# Patient Record
Sex: Female | Born: 1940
Health system: Southern US, Community
[De-identification: ages and names within clinical notes are randomized; demographics above are authoritative.]

## PROBLEM LIST (undated history)

## (undated) DIAGNOSIS — R112 Nausea with vomiting, unspecified: Secondary | ICD-10-CM

## (undated) DIAGNOSIS — E785 Hyperlipidemia, unspecified: Secondary | ICD-10-CM

## (undated) DIAGNOSIS — E039 Hypothyroidism, unspecified: Secondary | ICD-10-CM

## (undated) DIAGNOSIS — IMO0001 Reserved for inherently not codable concepts without codable children: Secondary | ICD-10-CM

## (undated) DIAGNOSIS — J40 Bronchitis, not specified as acute or chronic: Secondary | ICD-10-CM

## (undated) DIAGNOSIS — R059 Cough, unspecified: Secondary | ICD-10-CM

## (undated) DIAGNOSIS — Z9889 Other specified postprocedural states: Secondary | ICD-10-CM

## (undated) DIAGNOSIS — I10 Essential (primary) hypertension: Secondary | ICD-10-CM

## (undated) DIAGNOSIS — K219 Gastro-esophageal reflux disease without esophagitis: Secondary | ICD-10-CM

## (undated) DIAGNOSIS — IMO0002 Reserved for concepts with insufficient information to code with codable children: Secondary | ICD-10-CM

## (undated) DIAGNOSIS — I340 Nonrheumatic mitral (valve) insufficiency: Secondary | ICD-10-CM

## (undated) DIAGNOSIS — M549 Dorsalgia, unspecified: Secondary | ICD-10-CM

## (undated) DIAGNOSIS — Z9289 Personal history of other medical treatment: Secondary | ICD-10-CM

## (undated) DIAGNOSIS — E079 Disorder of thyroid, unspecified: Secondary | ICD-10-CM

## (undated) DIAGNOSIS — Z8709 Personal history of other diseases of the respiratory system: Secondary | ICD-10-CM

## (undated) DIAGNOSIS — C3490 Malignant neoplasm of unspecified part of unspecified bronchus or lung: Secondary | ICD-10-CM

## (undated) DIAGNOSIS — Z8489 Family history of other specified conditions: Secondary | ICD-10-CM

## (undated) DIAGNOSIS — M1990 Unspecified osteoarthritis, unspecified site: Secondary | ICD-10-CM

## (undated) DIAGNOSIS — R0602 Shortness of breath: Secondary | ICD-10-CM

## (undated) DIAGNOSIS — R55 Syncope and collapse: Secondary | ICD-10-CM

## (undated) DIAGNOSIS — R011 Cardiac murmur, unspecified: Secondary | ICD-10-CM

## (undated) DIAGNOSIS — I4891 Unspecified atrial fibrillation: Secondary | ICD-10-CM

## (undated) DIAGNOSIS — Z9221 Personal history of antineoplastic chemotherapy: Secondary | ICD-10-CM

## (undated) DIAGNOSIS — I351 Nonrheumatic aortic (valve) insufficiency: Secondary | ICD-10-CM

## (undated) DIAGNOSIS — J189 Pneumonia, unspecified organism: Secondary | ICD-10-CM

## (undated) DIAGNOSIS — N2889 Other specified disorders of kidney and ureter: Secondary | ICD-10-CM

## (undated) DIAGNOSIS — R05 Cough: Secondary | ICD-10-CM

## (undated) DIAGNOSIS — J45909 Unspecified asthma, uncomplicated: Secondary | ICD-10-CM

## (undated) HISTORY — PX: DILATION AND CURETTAGE OF UTERUS: SHX78

## (undated) HISTORY — DX: Nonrheumatic aortic (valve) insufficiency: I35.1

## (undated) HISTORY — PX: EYE SURGERY: SHX253

## (undated) HISTORY — DX: Hyperlipidemia, unspecified: E78.5

## (undated) HISTORY — DX: Personal history of other medical treatment: Z92.89

## (undated) HISTORY — DX: Other specified disorders of kidney and ureter: N28.89

## (undated) HISTORY — DX: Dorsalgia, unspecified: M54.9

## (undated) HISTORY — DX: Nonrheumatic mitral (valve) insufficiency: I34.0

---

## 1971-09-05 HISTORY — PX: THYROIDECTOMY: SHX17

## 1986-09-04 HISTORY — PX: CARPAL TUNNEL RELEASE: SHX101

## 1998-05-27 ENCOUNTER — Ambulatory Visit (HOSPITAL_COMMUNITY): Admission: RE | Admit: 1998-05-27 | Discharge: 1998-05-27 | Payer: Self-pay | Admitting: Gynecology

## 1999-04-20 ENCOUNTER — Encounter: Admission: RE | Admit: 1999-04-20 | Discharge: 1999-07-19 | Payer: Self-pay | Admitting: Internal Medicine

## 1999-06-27 ENCOUNTER — Encounter (INDEPENDENT_AMBULATORY_CARE_PROVIDER_SITE_OTHER): Payer: Self-pay

## 1999-06-27 ENCOUNTER — Other Ambulatory Visit: Admission: RE | Admit: 1999-06-27 | Discharge: 1999-06-27 | Payer: Self-pay | Admitting: Otolaryngology

## 1999-11-25 ENCOUNTER — Other Ambulatory Visit: Admission: RE | Admit: 1999-11-25 | Discharge: 1999-11-25 | Payer: Self-pay | Admitting: Gynecology

## 2001-01-10 ENCOUNTER — Other Ambulatory Visit: Admission: RE | Admit: 2001-01-10 | Discharge: 2001-01-10 | Payer: Self-pay | Admitting: Gynecology

## 2001-09-04 HISTORY — PX: JOINT REPLACEMENT: SHX530

## 2002-02-03 ENCOUNTER — Other Ambulatory Visit: Admission: RE | Admit: 2002-02-03 | Discharge: 2002-02-03 | Payer: Self-pay | Admitting: Gynecology

## 2002-02-14 ENCOUNTER — Ambulatory Visit (HOSPITAL_COMMUNITY): Admission: RE | Admit: 2002-02-14 | Discharge: 2002-02-14 | Payer: Self-pay | Admitting: *Deleted

## 2002-02-14 ENCOUNTER — Encounter (INDEPENDENT_AMBULATORY_CARE_PROVIDER_SITE_OTHER): Payer: Self-pay | Admitting: Specialist

## 2003-08-06 ENCOUNTER — Other Ambulatory Visit: Admission: RE | Admit: 2003-08-06 | Discharge: 2003-08-06 | Payer: Self-pay | Admitting: Gynecology

## 2004-09-04 HISTORY — PX: ROTATOR CUFF REPAIR: SHX139

## 2006-02-26 DIAGNOSIS — Z9289 Personal history of other medical treatment: Secondary | ICD-10-CM

## 2006-02-26 HISTORY — DX: Personal history of other medical treatment: Z92.89

## 2008-04-24 ENCOUNTER — Other Ambulatory Visit: Admission: RE | Admit: 2008-04-24 | Discharge: 2008-04-24 | Payer: Self-pay | Admitting: Internal Medicine

## 2008-04-30 ENCOUNTER — Encounter: Admission: RE | Admit: 2008-04-30 | Discharge: 2008-04-30 | Payer: Self-pay | Admitting: Internal Medicine

## 2008-06-25 ENCOUNTER — Encounter: Payer: Self-pay | Admitting: Internal Medicine

## 2008-07-02 ENCOUNTER — Encounter: Payer: Self-pay | Admitting: Internal Medicine

## 2008-07-08 HISTORY — PX: CARDIAC CATHETERIZATION: SHX172

## 2008-07-23 ENCOUNTER — Encounter: Payer: Self-pay | Admitting: Internal Medicine

## 2008-08-10 ENCOUNTER — Encounter: Admission: RE | Admit: 2008-08-10 | Discharge: 2008-08-10 | Payer: Self-pay | Admitting: Allergy and Immunology

## 2008-08-17 ENCOUNTER — Ambulatory Visit: Payer: Self-pay | Admitting: Internal Medicine

## 2008-08-17 DIAGNOSIS — J45909 Unspecified asthma, uncomplicated: Secondary | ICD-10-CM

## 2008-08-17 DIAGNOSIS — J309 Allergic rhinitis, unspecified: Secondary | ICD-10-CM | POA: Insufficient documentation

## 2008-08-17 DIAGNOSIS — I10 Essential (primary) hypertension: Secondary | ICD-10-CM

## 2008-08-17 DIAGNOSIS — E785 Hyperlipidemia, unspecified: Secondary | ICD-10-CM

## 2008-08-17 DIAGNOSIS — R0602 Shortness of breath: Secondary | ICD-10-CM

## 2008-08-26 ENCOUNTER — Ambulatory Visit: Payer: Self-pay | Admitting: Internal Medicine

## 2008-09-08 ENCOUNTER — Ambulatory Visit: Payer: Self-pay | Admitting: Internal Medicine

## 2008-09-08 LAB — CONVERTED CEMR LAB: IgE (Immunoglobulin E), Serum: 27.6 intl units/mL (ref 0.0–180.0)

## 2008-09-10 ENCOUNTER — Ambulatory Visit: Payer: Self-pay | Admitting: Internal Medicine

## 2008-10-02 ENCOUNTER — Encounter (INDEPENDENT_AMBULATORY_CARE_PROVIDER_SITE_OTHER): Payer: Self-pay | Admitting: *Deleted

## 2008-10-02 ENCOUNTER — Encounter: Payer: Self-pay | Admitting: Internal Medicine

## 2008-10-26 ENCOUNTER — Encounter: Payer: Self-pay | Admitting: Internal Medicine

## 2008-10-26 ENCOUNTER — Ambulatory Visit (HOSPITAL_COMMUNITY): Admission: RE | Admit: 2008-10-26 | Discharge: 2008-10-26 | Payer: Self-pay | Admitting: Internal Medicine

## 2008-10-30 ENCOUNTER — Encounter (HOSPITAL_COMMUNITY): Admission: RE | Admit: 2008-10-30 | Discharge: 2009-01-28 | Payer: Self-pay | Admitting: Internal Medicine

## 2008-10-30 ENCOUNTER — Encounter: Payer: Self-pay | Admitting: Internal Medicine

## 2008-11-18 ENCOUNTER — Ambulatory Visit: Payer: Self-pay | Admitting: Internal Medicine

## 2008-12-15 ENCOUNTER — Ambulatory Visit: Payer: Self-pay | Admitting: Internal Medicine

## 2008-12-28 ENCOUNTER — Encounter: Payer: Self-pay | Admitting: Internal Medicine

## 2009-01-19 ENCOUNTER — Ambulatory Visit (HOSPITAL_COMMUNITY): Admission: RE | Admit: 2009-01-19 | Discharge: 2009-01-19 | Payer: Self-pay | Admitting: Internal Medicine

## 2009-01-19 ENCOUNTER — Encounter: Payer: Self-pay | Admitting: Internal Medicine

## 2009-01-26 ENCOUNTER — Ambulatory Visit: Payer: Self-pay | Admitting: Internal Medicine

## 2009-01-29 ENCOUNTER — Encounter (HOSPITAL_COMMUNITY): Admission: RE | Admit: 2009-01-29 | Discharge: 2009-04-04 | Payer: Self-pay | Admitting: Internal Medicine

## 2009-02-03 ENCOUNTER — Telehealth: Payer: Self-pay | Admitting: Internal Medicine

## 2009-02-05 ENCOUNTER — Encounter (INDEPENDENT_AMBULATORY_CARE_PROVIDER_SITE_OTHER): Payer: Self-pay | Admitting: *Deleted

## 2009-02-05 ENCOUNTER — Ambulatory Visit (HOSPITAL_COMMUNITY): Admission: RE | Admit: 2009-02-05 | Discharge: 2009-02-05 | Payer: Self-pay | Admitting: *Deleted

## 2009-02-08 ENCOUNTER — Ambulatory Visit: Payer: Self-pay | Admitting: Cardiovascular Disease

## 2009-02-09 ENCOUNTER — Telehealth (INDEPENDENT_AMBULATORY_CARE_PROVIDER_SITE_OTHER): Payer: Self-pay | Admitting: *Deleted

## 2009-02-16 ENCOUNTER — Encounter: Payer: Self-pay | Admitting: Internal Medicine

## 2009-02-23 ENCOUNTER — Encounter: Admission: RE | Admit: 2009-02-23 | Discharge: 2009-02-23 | Payer: Self-pay | Admitting: Internal Medicine

## 2009-02-23 ENCOUNTER — Other Ambulatory Visit: Admission: RE | Admit: 2009-02-23 | Discharge: 2009-02-23 | Payer: Self-pay | Admitting: Interventional Radiology

## 2009-02-23 ENCOUNTER — Encounter (INDEPENDENT_AMBULATORY_CARE_PROVIDER_SITE_OTHER): Payer: Self-pay | Admitting: Interventional Radiology

## 2009-07-22 ENCOUNTER — Ambulatory Visit (HOSPITAL_COMMUNITY): Admission: RE | Admit: 2009-07-22 | Discharge: 2009-07-23 | Payer: Self-pay | Admitting: Surgery

## 2009-07-22 ENCOUNTER — Encounter (INDEPENDENT_AMBULATORY_CARE_PROVIDER_SITE_OTHER): Payer: Self-pay | Admitting: Surgery

## 2010-01-05 ENCOUNTER — Ambulatory Visit (HOSPITAL_COMMUNITY): Admission: RE | Admit: 2010-01-05 | Discharge: 2010-01-05 | Payer: Self-pay | Admitting: Gastroenterology

## 2010-09-04 HISTORY — PX: KNEE ARTHROSCOPY: SUR90

## 2010-12-07 LAB — BASIC METABOLIC PANEL
CO2: 30 mEq/L (ref 19–32)
Calcium: 9.6 mg/dL (ref 8.4–10.5)
Creatinine, Ser: 0.92 mg/dL (ref 0.4–1.2)
GFR calc Af Amer: >60 mL/min (ref >60)

## 2010-12-07 LAB — CBC
MCHC: 33.9 g/dL (ref 30.0–36.0)
RBC: 3.54 MIL/uL — ABNORMAL LOW (ref 3.87–5.11)

## 2010-12-07 LAB — DIFFERENTIAL
Basophils Absolute: 0 10*3/uL (ref 0.0–0.1)
Basophils Relative: 0 % (ref 0–1)
Monocytes Relative: 7 % (ref 3–12)
Neutro Abs: 6.5 10*3/uL (ref 1.7–7.7)
Neutrophils Relative %: 69 % (ref 43–77)

## 2010-12-07 LAB — URINALYSIS, ROUTINE W REFLEX MICROSCOPIC
Hgb urine dipstick: NEGATIVE
Protein, ur: NEGATIVE mg/dL
Urobilinogen, UA: 0.2 mg/dL (ref 0.0–1.0)

## 2010-12-07 LAB — PROTIME-INR
INR: 0.85 (ref 0.00–1.49)
Prothrombin Time: 11.5 seconds — ABNORMAL LOW (ref 11.6–15.2)

## 2010-12-07 LAB — CALCIUM: Calcium: 9 mg/dL (ref 8.4–10.5)

## 2011-01-17 NOTE — Op Note (Signed)
NAME:  Kimberly [name], VIRGO NO.:  [id number]   MEDICAL RECORD NO.:  [id number]          PATIENT TYPE:  AMB   LOCATION:  ENDO                         FACILITY:  [facility]   PHYSICIAN:  Georgiana Spinner, M.D.    DATE OF BIRTH:  [birth year]   DATE OF PROCEDURE:  02/05/2009  DATE OF DISCHARGE:                               OPERATIVE REPORT   PROCEDURE:  Colonoscopy.   INDICATIONS:  Hemoccult positivity.   ANESTHESIA:  Fentanyl 25 mcg, Versed 2.5 mg.   PROCEDURE IN DETAIL:  With the patient mildly sedated in the left  lateral decubitus position the Pentax videoscopic pediatric colonoscope  was inserted in the rectum and passed under direct vision with pressure  applied and the patient rolled to her back.  We were able to reach the  cecum as identified by ileocecal valve and appendiceal orifice, both of  which were photographed.  From this point the colonoscope was slowly  withdrawn taking circumferential views of colonic mucosa stopping only  in the rectum which appeared normal on direct and showed hemorrhoids on  retroflexed view and pulled through the anal canal.  The endoscope was  straightened and withdrawn.  The patient's vital signs, pulse oximeter  remained stable.  The patient tolerated procedure well without apparent  complications.   FINDINGS:  Occasional diverticulum and hemorrhoids noted on retroflexed  view and pulled through the anal canal, otherwise an unremarkable exam.   PLAN:  Have the patient follow up with me as noted on the endoscopy  note.           ______________________________  Georgiana Spinner, M.D.     GMO/MEDQ  D:  02/05/2009  T:  02/05/2009  Job:  [id number]

## 2011-01-17 NOTE — Op Note (Signed)
NAME:  Kimberly [name], PHENIX NO.:  [id number]   MEDICAL RECORD NO.:  [id number]          PATIENT TYPE:  AMB   LOCATION:  ENDO                         FACILITY:  [facility]   PHYSICIAN:  Georgiana Spinner, M.D.    DATE OF BIRTH:  1941-02-08   DATE OF PROCEDURE:  02/05/2009  DATE OF DISCHARGE:                               OPERATIVE REPORT   PROCEDURE:  Upper endoscopy.   INDICATIONS:  Hemoccult positivity.   ANESTHESIA:  Fentanyl 75 mcg, Versed 7.5 mg.   PROCEDURE:  With the patient mildly sedated in the left lateral  decubitus position the Pentax videoscopic endoscope was inserted in the  mouth, passed under direct vision through the esophagus into the  stomach.  Fundus, body, antrum, duodenal bulb, second portion of  duodenum were visualized.  From this point the endoscope was slowly  withdrawn, taking circumferential views of duodenal mucosa until the  endoscope had been pulled back into the stomach, placed in retroflexion  to view the stomach from below.  The endoscope was straightened and  withdrawn, taking circumferential views of remaining gastric and  esophageal mucosa, stopping in the distal esophagus, where an [place] of  Barrett's esophagus was photographed and biopsied.  The endoscope was  withdrawn.  The patient's vital signs, pulse oximeter remained stable.  The patient tolerated procedure well without apparent complications.   FINDINGS:  One island of Barrett's esophagus, biopsied.  Await biopsy  report.  The patient will call me for results and follow up with me as  an outpatient.  Proceed to colonoscopy as planned.           ______________________________  Georgiana Spinner, M.D.     GMO/MEDQ  D:  02/05/2009  T:  02/05/2009  Job:  [id number]

## 2011-01-20 NOTE — Procedures (Signed)
[facility]  Patient:    Kimberly [name], Kimberly [name] Visit Number: [id number] MRN: [id number]          Service Type: END Location: ENDO Attending Physician:  Sabino Gasser Dictated by:   Sabino Gasser, M.D. Proc. Date: 02/14/02 Admit Date:  02/14/2002                             Procedure Report  PROCEDURE:  Colonoscopy.  INDICATIONS:  Colon cancer screening.  ANESTHESIA:  Versed 1.5 mg.  DESCRIPTION OF PROCEDURE:  With the patient mildly sedated in the left lateral decubitus position, the Olympus videoscopic colonoscope was inserted into the rectum and passed under direct vision to the cecum, identified by the ileocecal valve and appendiceal orifice, both of which were photographed. From this point, the colonoscope was slowly withdrawn, taking circumferential views of the entire colonic mucosa, stopping only in the rectum which appeared normal on direct, and retroflexed view showed hemorrhoid.  We straightened the endoscope and withdrew, taking circumferential views of the anal canal, and hemorrhoid were once again seen externally.  These were photographed. The endoscope was withdrawn.  The patients vital signs and pulse oximeter remained stable.  The patient tolerated the procedure well without apparent complications.  FINDINGS:  Hemorrhoidal changes.  Otherwise unremarkable examination.  PLAN:  See endoscopy note for further details. Dictated by:   Sabino Gasser, M.D. Attending Physician:  Sabino Gasser DD:  02/14/02 TD:  02/15/02 Job: [id number]

## 2011-01-20 NOTE — Procedures (Signed)
[facility]  Patient:    Kimberly [name], Kimberly [name] Visit Number: [id number] MRN: [id number]          Service Type: END Location: ENDO Attending Physician:  Sabino Gasser Dictated by:   Sabino Gasser, M.D. Proc. Date: 02/14/02 Admit Date:  02/14/2002                             Procedure Report  PROCEDURE:  Upper endoscopy.  INDICATIONS:  Gastroesophageal reflux disease.  ANESTHESIA:  Demerol 70, Versed 6 mg.  DESCRIPTION OF PROCEDURE:  With the patient mildly sedated in the left lateral decubitus position, the Olympus videoscopic endoscope was inserted into the mouth and passed under direct vision through the esophagus, which appeared normal, until we reached the distal esophagus and showed changes of Barretts esophagus, photographed, and biopsied.  We entered into the stomach; fundus, body, antrum, duodenal bulb, and second portion of duodenum all appeared normal.  From this point, the endoscope was slowly withdrawn, taking circumferential views of the entire duodenal mucosa until the endoscope then pulled back into the stomach and placed in retroflexion to view the stomach from below.  The endoscope was then straightened and withdrawn, taking circumferential views of the remaining gastric and esophageal mucosa.  The patients vital signs and pulse oximeter remained stable.  The patient tolerated the procedure well without apparent complications.  FINDINGS:  Changes of Barretts esophagus, biopsied.  Await biopsy report. The patient will call me for results and follow up with me as an outpatient. Proceed to colonoscopy as planned. Dictated by:   Sabino Gasser, M.D. Attending Physician:  Sabino Gasser DD:  02/14/02 TD:  02/15/02 Job: [id number]

## 2011-04-04 ENCOUNTER — Other Ambulatory Visit (HOSPITAL_COMMUNITY)
Admission: RE | Admit: 2011-04-04 | Discharge: 2011-04-04 | Disposition: A | Discharge status: Home / Self Care | Payer: Medicare Other | Source: Ambulatory Visit | Attending: Internal Medicine | Admitting: Internal Medicine

## 2011-04-04 ENCOUNTER — Other Ambulatory Visit: Payer: Self-pay | Admitting: Internal Medicine

## 2011-04-04 DIAGNOSIS — Z1159 Encounter for screening for other viral diseases: Secondary | ICD-10-CM | POA: Insufficient documentation

## 2011-04-04 DIAGNOSIS — Z01419 Encounter for gynecological examination (general) (routine) without abnormal findings: Secondary | ICD-10-CM | POA: Insufficient documentation

## 2011-05-31 ENCOUNTER — Emergency Department (HOSPITAL_BASED_OUTPATIENT_CLINIC_OR_DEPARTMENT_OTHER)
Admission: EM | Admit: 2011-05-31 | Discharge: 2011-05-31 | Disposition: A | Discharge status: Home / Self Care | Payer: Medicare Other | Attending: Emergency Medicine | Admitting: Emergency Medicine

## 2011-05-31 ENCOUNTER — Other Ambulatory Visit: Payer: Self-pay

## 2011-05-31 ENCOUNTER — Emergency Department (INDEPENDENT_AMBULATORY_CARE_PROVIDER_SITE_OTHER): Payer: Medicare Other

## 2011-05-31 ENCOUNTER — Encounter: Payer: Self-pay | Admitting: *Deleted

## 2011-05-31 DIAGNOSIS — M549 Dorsalgia, unspecified: Secondary | ICD-10-CM

## 2011-05-31 DIAGNOSIS — K219 Gastro-esophageal reflux disease without esophagitis: Secondary | ICD-10-CM | POA: Insufficient documentation

## 2011-05-31 DIAGNOSIS — I10 Essential (primary) hypertension: Secondary | ICD-10-CM | POA: Insufficient documentation

## 2011-05-31 DIAGNOSIS — R0602 Shortness of breath: Secondary | ICD-10-CM

## 2011-05-31 DIAGNOSIS — R110 Nausea: Secondary | ICD-10-CM

## 2011-05-31 DIAGNOSIS — R079 Chest pain, unspecified: Secondary | ICD-10-CM

## 2011-05-31 DIAGNOSIS — R791 Abnormal coagulation profile: Secondary | ICD-10-CM

## 2011-05-31 DIAGNOSIS — E079 Disorder of thyroid, unspecified: Secondary | ICD-10-CM | POA: Insufficient documentation

## 2011-05-31 HISTORY — DX: Essential (primary) hypertension: I10

## 2011-05-31 HISTORY — DX: Disorder of thyroid, unspecified: E07.9

## 2011-05-31 HISTORY — DX: Gastro-esophageal reflux disease without esophagitis: K21.9

## 2011-05-31 LAB — BASIC METABOLIC PANEL
BUN: 24 mg/dL — ABNORMAL HIGH (ref 6–23)
Creatinine, Ser: 1 mg/dL (ref 0.50–1.10)
GFR calc non Af Amer: 55 mL/min — ABNORMAL LOW (ref >60)
Glucose, Bld: 155 mg/dL — ABNORMAL HIGH (ref 70–99)
Potassium: 4.5 mEq/L (ref 3.5–5.1)

## 2011-05-31 LAB — CBC
Hemoglobin: 11.5 g/dL — ABNORMAL LOW (ref 12.0–15.0)
MCH: 32.3 pg (ref 26.0–34.0)
MCHC: 35.4 g/dL (ref 30.0–36.0)
RDW: 13.6 % (ref 11.5–15.5)

## 2011-05-31 LAB — CK TOTAL AND CKMB (NOT AT ARMC): Total CK: 80 U/L (ref 7–177)

## 2011-05-31 LAB — TROPONIN I: Troponin I: <0.3 ng/mL (ref <0.30)

## 2011-05-31 MED ORDER — IOHEXOL 350 MG/ML SOLN
80.0000 mL | Freq: Once | INTRAVENOUS | Status: AC | PRN
  Administered 2011-05-31: 80 mL via INTRAVENOUS

## 2011-05-31 NOTE — ED Provider Notes (Signed)
History    this 70 year old female presents with complaints of back pain. She was in her usual state of health prior to the sudden onset of pain in the hours prior to presentation. She notes that she suddenly developed pain focally across her superior back, bilaterally. The pain was accompanied by nausea. The pain was sharp, nonradiating, nonexertional, mildly improved with different positions, nonpleuritic. The patient took 2 aspirin and received 1slng en route and on M.D. evaluation she is without pain.  CSN: [id number] Arrival date & time: 05/31/2011 12:51 AM  Chief Complaint  Patient presents with  . Back Pain    HPI  (Consider location/radiation/quality/duration/timing/severity/associated sxs/prior treatment)  HPI  Past Medical History  Diagnosis Date  . Hypertension   . Thyroid disease   . GERD (gastroesophageal reflux disease)   . Cancer     Past Surgical History  Procedure Date  . Thyroidectomy   . Joint replacement   . Dilation and curettage of uterus   . Carpal tunnel release     No family history on file.  History  Substance Use Topics  . Smoking status: Never Smoker   . Smokeless tobacco: Not on file  . Alcohol Use: Yes    OB History    Grav Para Term Preterm Abortions TAB SAB Ect Mult Living                  Review of Systems  Review of Systems  Constitutional: Negative for fever and chills.  HENT: Negative for hearing loss and neck pain.   Eyes: Negative for visual disturbance.  Respiratory: Negative for shortness of breath.   Cardiovascular: Negative for chest pain, palpitations and leg swelling.  Gastrointestinal: Negative.   Genitourinary: Negative.   Musculoskeletal: Positive for back pain.  Neurological: Positive for numbness. Negative for dizziness and headaches.  Psychiatric/Behavioral: Negative.     Allergies  Review of patient's allergies indicates no known allergies.  Home Medications   Current Outpatient Rx  Name Route Sig  Dispense Refill  . AMLODIPINE BESYLATE 5 MG PO TABS Oral Take 5 mg by mouth daily.      [name] ESOMEPRAZOLE MAGNESIUM 20 MG PO CPDR Oral Take 20 mg by mouth daily before breakfast. Unknown dose     . HYDROCHLOROTHIAZIDE 25 MG PO TABS Oral Take 25 mg by mouth daily. Dose unknown     . LEVOTHYROXINE SODIUM 150 MCG PO TABS Oral Take 150 mcg by mouth daily.      [name] LISINOPRIL 20 MG PO TABS Oral Take by mouth daily. Unknown dose       Physical Exam    BP 140/53  Pulse 73  Resp 18  SpO2 99%  Physical Exam  Constitutional: She is oriented to person, place, and time. She appears well-developed and well-nourished.  HENT:  Head: Normocephalic and atraumatic.  Eyes: EOM are normal.  Cardiovascular: Normal rate and regular rhythm.   Pulmonary/Chest: Effort normal and breath sounds normal.  Abdominal: She exhibits no distension.  Musculoskeletal: She exhibits no edema and no tenderness.  Neurological: She is alert and oriented to person, place, and time.  Skin: Skin is warm and dry.    ED Course  Procedures (including critical care time)  Labs Reviewed  CBC - Abnormal; Notable for the following:    WBC 11.5 (*)    RBC 3.56 (*)    Hemoglobin 11.5 (*)    HCT 32.5 (*)    All other components within normal limits  BASIC METABOLIC PANEL  CK TOTAL AND CKMB  D-DIMER, QUANTITATIVE  TROPONIN I   No results found.   No diagnosis found.   MDM 70yo F w sudden onset back pain, now asymptomatic.  Concern for disection.  Absent current pain / hypoxia / nausea this is unlikely to be ACS.  Labs Reviewed  BASIC METABOLIC PANEL - Abnormal; Notable for the following:    Glucose, Bld 155 (*)    BUN 24 (*)    GFR calc non Af Amer 55 (*)    All other components within normal limits  CBC - Abnormal; Notable for the following:    WBC 11.5 (*)    RBC 3.56 (*)    Hemoglobin 11.5 (*)    HCT 32.5 (*)    All other components within normal limits  D-DIMER, QUANTITATIVE - Abnormal; Notable for the  following:    D-Dimer, Quant 0.66 (*)    All other components within normal limits  CK TOTAL AND CKMB  TROPONIN I    With positive dimer, the patient had a CTA, which was not remarkable.   Date: 05/31/2011  Rate: 76  Rhythm: normal sinus rhythm  QRS Axis: left  Intervals: normal  ST/T Wave abnormalities: normal  Conduction Disutrbances:none  Narrative Interpretation:   Old EKG Reviewed: none available  Throughout her time in the ED the patient remained essentially asymptomatic, and she was d/c home.        Gerhard Munch, MD 05/31/11 [phone]

## 2011-05-31 NOTE — ED Notes (Signed)
Pt sts she was getting ready for bed when she had a sudden onset of mid-back pain that radiated down her right arm and into her neck. Pt took baby asa x2 PTA. Pt sts pain is gone now but she feels nauseas. EMS sts 12-lead negative and they gave 1 subl nitro while enroute. No IV access obtained by EMS.

## 2012-04-02 HISTORY — PX: OTHER SURGICAL HISTORY: SHX169

## 2012-09-06 ENCOUNTER — Encounter (HOSPITAL_COMMUNITY): Payer: Self-pay | Admitting: Emergency Medicine

## 2012-09-06 ENCOUNTER — Emergency Department (HOSPITAL_COMMUNITY)
Admission: EM | Admit: 2012-09-06 | Discharge: 2012-09-07 | Disposition: A | Discharge status: Home / Self Care | Payer: Medicare Other | Attending: Emergency Medicine | Admitting: Emergency Medicine

## 2012-09-06 ENCOUNTER — Emergency Department (HOSPITAL_COMMUNITY): Payer: Medicare Other

## 2012-09-06 DIAGNOSIS — R55 Syncope and collapse: Secondary | ICD-10-CM | POA: Insufficient documentation

## 2012-09-06 DIAGNOSIS — K219 Gastro-esophageal reflux disease without esophagitis: Secondary | ICD-10-CM | POA: Insufficient documentation

## 2012-09-06 DIAGNOSIS — R42 Dizziness and giddiness: Secondary | ICD-10-CM | POA: Insufficient documentation

## 2012-09-06 DIAGNOSIS — R110 Nausea: Secondary | ICD-10-CM | POA: Insufficient documentation

## 2012-09-06 DIAGNOSIS — Z859 Personal history of malignant neoplasm, unspecified: Secondary | ICD-10-CM | POA: Insufficient documentation

## 2012-09-06 DIAGNOSIS — R197 Diarrhea, unspecified: Secondary | ICD-10-CM | POA: Insufficient documentation

## 2012-09-06 DIAGNOSIS — Z79899 Other long term (current) drug therapy: Secondary | ICD-10-CM | POA: Insufficient documentation

## 2012-09-06 DIAGNOSIS — R51 Headache: Secondary | ICD-10-CM | POA: Insufficient documentation

## 2012-09-06 DIAGNOSIS — E079 Disorder of thyroid, unspecified: Secondary | ICD-10-CM | POA: Insufficient documentation

## 2012-09-06 DIAGNOSIS — I10 Essential (primary) hypertension: Secondary | ICD-10-CM | POA: Insufficient documentation

## 2012-09-06 LAB — URINALYSIS, ROUTINE W REFLEX MICROSCOPIC
Bilirubin Urine: NEGATIVE
Nitrite: NEGATIVE
Protein, ur: NEGATIVE mg/dL
Specific Gravity, Urine: 1.018 (ref 1.005–1.030)
Urobilinogen, UA: 0.2 mg/dL (ref 0.0–1.0)

## 2012-09-06 LAB — POCT I-STAT TROPONIN I: Troponin i, poc: 0 ng/mL (ref 0.00–0.08)

## 2012-09-06 LAB — CBC WITH DIFFERENTIAL/PLATELET
Basophils Absolute: 0.1 10*3/uL (ref 0.0–0.1)
Basophils Relative: 1 % (ref 0–1)
Eosinophils Absolute: 0.4 10*3/uL (ref 0.0–0.7)
MCH: 30.8 pg (ref 26.0–34.0)
MCHC: 33.9 g/dL (ref 30.0–36.0)
Neutro Abs: 6.6 10*3/uL (ref 1.7–7.7)
Neutrophils Relative %: 63 % (ref 43–77)
RDW: 12.8 % (ref 11.5–15.5)

## 2012-09-06 LAB — POCT I-STAT, CHEM 8
BUN: 25 mg/dL — ABNORMAL HIGH (ref 6–23)
Calcium, Ion: 1.25 mmol/L (ref 1.13–1.30)
Chloride: 102 meq/L (ref 96–112)
Creatinine, Ser: 1.3 mg/dL — ABNORMAL HIGH (ref 0.50–1.10)
Glucose, Bld: 130 mg/dL — ABNORMAL HIGH (ref 70–99)
HCT: 36 % (ref 36.0–46.0)
Hemoglobin: 12.2 g/dL (ref 12.0–15.0)
Potassium: 4.6 meq/L (ref 3.5–5.1)
Sodium: 139 meq/L (ref 135–145)
TCO2: 30 mmol/L (ref 0–100)

## 2012-09-06 MED ORDER — SODIUM CHLORIDE 0.9 % IV SOLN
Freq: Once | INTRAVENOUS | Status: AC
Start: 2012-09-06 — End: 2012-09-07
  Administered 2012-09-06: 22:00:00 via INTRAVENOUS

## 2012-09-06 NOTE — ED Notes (Signed)
Patient was at a party where she drank one glass of wine, began to feel dizzy and was lowered to the ground.  Passed out for 2 minutes according to bystanders. Pt reported to EMS that she has a history of passing out while drinking wine.  Pt became nauseated and had an episode of diarrhea after EMS arrived.  Patient also hypertensive at this time according to EMS.  200/100  Pt denied chest pain, SOB.

## 2012-09-06 NOTE — ED Provider Notes (Signed)
Patient reports she's had about 4 episodes in the past where she gets a feeling of lightheadedness without spinning and if she does not sit down she starts feeling off-balance and she will have syncope. Tonight her episode was witnessed by family members who relate she was unconscious for a couple of minutes. She states this can happen when she is drinking wine however it also happens when she is not drinking. She states she's never discussed this with her PCP. She denies any injuries today. She denies any chest pain or shortness of breath.  Patient is alert and cooperative and at this point is appears to be in no distress.  Medical screening examination/treatment/procedure(s) were conducted as a shared visit with non-physician practitioner(s) and myself.  I personally evaluated the patient during the encounter Devoria Albe, MD, Franz Dell, MD 09/06/12 [phone]

## 2012-09-06 NOTE — ED Notes (Signed)
CBG 128 

## 2012-09-06 NOTE — ED Provider Notes (Signed)
History     CSN: [id number]  Arrival date & time 09/06/12  2003   First MD Initiated Contact with Patient 09/06/12 2015      Chief Complaint  Patient presents with  . Near Syncope    (Consider location/radiation/quality/duration/timing/severity/associated sxs/prior treatment) HPI Comments: Was standing at a party suddenly felt "off" than fainted per report was unresponsive for about 2 minutes Now has HA, nausea, had 1 episode of diarrhea.   Has had several syncopal episodes in the past several years but has not mentioned these to her PCP Hx of HTN, Thyroid dx.   Transported via EMS  The history is provided by the patient.    Past Medical History  Diagnosis Date  . Hypertension   . Thyroid disease   . GERD (gastroesophageal reflux disease)   . Cancer     Past Surgical History  Procedure Date  . Thyroidectomy   . Joint replacement   . Dilation and curettage of uterus   . Carpal tunnel release     History reviewed. No pertinent family history.  History  Substance Use Topics  . Smoking status: Never Smoker   . Smokeless tobacco: Not on file  . Alcohol Use: Yes    OB History    Grav Para Term Preterm Abortions TAB SAB Ect Mult Living                  Review of Systems  Constitutional: Negative for fever and chills.  HENT: Negative for neck pain and neck stiffness.   Eyes: Negative for visual disturbance.  Respiratory: Negative for cough and shortness of breath.   Cardiovascular: Negative for chest pain and leg swelling.  Gastrointestinal: Positive for nausea and diarrhea. Negative for anal bleeding.  Neurological: Positive for headaches. Negative for dizziness.    Allergies  Review of patient's allergies indicates no known allergies.  Home Medications   Current Outpatient Rx  Name  Route  Sig  Dispense  Refill  . AMLODIPINE BESYLATE 10 MG PO TABS   Oral   Take 10 mg by mouth daily.         . BUDESONIDE-FORMOTEROL FUMARATE 160-4.5 MCG/ACT IN AERO  Inhalation   Inhale 2 puffs into the lungs 2 (two) times daily.         . BUPROPION HCL ER (XL) 300 MG PO TB24   Oral   Take 300 mg by mouth daily.         [name] CALCIUM CARBONATE 1250 MG PO TABS   Oral   Take 1 tablet by mouth daily.         [name] LEVOTHYROXINE SODIUM 137 MCG PO TABS   Oral   Take 137 mcg by mouth daily.         . MULTI-VITAMIN/MINERALS PO TABS   Oral   Take 1 tablet by mouth daily.         [name] PANTOPRAZOLE SODIUM 40 MG PO TBEC   Oral   Take 40 mg by mouth daily.         [name] RANITIDINE HCL 300 MG PO TABS   Oral   Take 300 mg by mouth at bedtime.         [name] VALSARTAN-HYDROCHLOROTHIAZIDE 320-25 MG PO TABS   Oral   Take 1 tablet by mouth daily.           BP 133/65  Pulse 86  Temp 98.4 F (36.9 C) (Oral)  Resp 12  SpO2 97%  Physical Exam  Constitutional: She is oriented to person, place, and time. She appears well-nourished.  HENT:  Head: Normocephalic and atraumatic.  Right Ear: External ear normal.  Left Ear: External ear normal.  Mouth/Throat: Oropharynx is clear and moist.  Eyes: Pupils are equal, round, and reactive to light.  Neck: Normal range of motion. Neck supple.  Cardiovascular: Normal rate.   Pulmonary/Chest: Effort normal and breath sounds normal.  Abdominal: Soft. She exhibits no distension. There is no tenderness.  Musculoskeletal: Normal range of motion. She exhibits no edema.  Neurological: She is alert and oriented to person, place, and time.  Skin: Skin is warm and dry. No rash noted.    ED Course  Procedures (including critical care time)  Labs Reviewed  CBC WITH DIFFERENTIAL - Abnormal; Notable for the following:    HCT 35.4 (*)     All other components within normal limits  POCT I-STAT, CHEM 8 - Abnormal; Notable for the following:    BUN 25 (*)     Creatinine, Ser 1.30 (*)     Glucose, Bld 130 (*)     All other components within normal limits  URINALYSIS, ROUTINE W REFLEX MICROSCOPIC  POCT I-STAT TROPONIN I   LAB REPORT - SCANNED   Ct Head Wo Contrast  09/06/2012  *RADIOLOGY REPORT*  Clinical Data: Syncope, right eye pain  CT HEAD WITHOUT CONTRAST  Technique:  Contiguous axial images were obtained from the base of the skull through the vertex without contrast.  Comparison: None.  Findings: No acute hemorrhage, acute infarction, or mass lesion is identified.  No midline shift.  No ventriculomegaly.  No skull fracture.  Orbits and paranasal sinuses are intact.  Evidence of prior bilateral antrectomy.  IMPRESSION: No acute intracranial process.   Original Report Authenticated By: Christiana Pellant, M.D.      1. Syncope     ED ECG REPORT   Date: 09/07/2012  EKG Time: 6:05 AM  Rate: 68  Rhythm: normal sinus rhythm,  unchanged from previous tracings  Axis: normal  Intervals:none  ST&T Change: normal  Narrative Interpretation: normal            MDM   Patient is not orthostatic, feels at base line  Will DC to FU with Dr. Dorthula Perfect, NP 09/07/12 [phone]

## 2012-09-06 NOTE — ED Notes (Signed)
Returned from CT.

## 2012-09-06 NOTE — ED Notes (Signed)
Bed:WA05<BR> Expected date:<BR> Expected time:<BR> Means of arrival:<BR> Comments:<BR> EMS

## 2012-09-07 NOTE — ED Provider Notes (Signed)
See prior note   Ward Givens, MD 09/07/12 [phone]

## 2012-09-16 ENCOUNTER — Other Ambulatory Visit (HOSPITAL_COMMUNITY): Payer: Self-pay | Admitting: Internal Medicine

## 2012-09-16 DIAGNOSIS — R011 Cardiac murmur, unspecified: Secondary | ICD-10-CM

## 2012-09-25 ENCOUNTER — Ambulatory Visit (HOSPITAL_COMMUNITY)
Admission: RE | Admit: 2012-09-25 | Discharge: 2012-09-25 | Disposition: A | Discharge status: Home / Self Care | Payer: Medicare Other | Source: Ambulatory Visit | Attending: Internal Medicine | Admitting: Internal Medicine

## 2012-09-25 DIAGNOSIS — I080 Rheumatic disorders of both mitral and aortic valves: Secondary | ICD-10-CM | POA: Insufficient documentation

## 2012-09-25 DIAGNOSIS — I10 Essential (primary) hypertension: Secondary | ICD-10-CM | POA: Insufficient documentation

## 2012-09-25 DIAGNOSIS — R011 Cardiac murmur, unspecified: Secondary | ICD-10-CM | POA: Insufficient documentation

## 2012-09-25 HISTORY — PX: TRANSTHORACIC ECHOCARDIOGRAM: SHX275

## 2012-09-25 NOTE — Progress Notes (Signed)
[facility]   2D echo completed 09/25/2012..   Cindy Adlyn Fife, RDCS   

## 2012-10-19 ENCOUNTER — Other Ambulatory Visit: Payer: Self-pay

## 2013-04-09 ENCOUNTER — Other Ambulatory Visit: Payer: Self-pay

## 2013-04-14 ENCOUNTER — Encounter: Payer: Self-pay | Admitting: *Deleted

## 2013-04-18 ENCOUNTER — Encounter: Payer: Self-pay | Admitting: Internal Medicine

## 2013-04-18 ENCOUNTER — Ambulatory Visit (INDEPENDENT_AMBULATORY_CARE_PROVIDER_SITE_OTHER): Payer: Medicare Other | Admitting: Internal Medicine

## 2013-04-18 VITALS — BP 142/62 | HR 63 | Ht 60.0 in | Wt 185.9 lb

## 2013-04-18 DIAGNOSIS — R55 Syncope and collapse: Secondary | ICD-10-CM

## 2013-04-18 DIAGNOSIS — I10 Essential (primary) hypertension: Secondary | ICD-10-CM

## 2013-04-18 MED ORDER — AZILSARTAN-CHLORTHALIDONE 40-12.5 MG PO TABS: 1.0000 | ORAL_TABLET | Freq: Every day | ORAL | Status: DC

## 2013-04-18 NOTE — Patient Instructions (Addendum)
Your physician has recommended you make the following change in your medication:  Decrease Amlodipine to 5mg  daily.  STOP taking Diovan-HCT. START Edarbyclor 40mg /12.5 mg (one tablet daily)  Your physician wants you to follow-up in: 6 months. You will receive a reminder letter in the mail two months in advance. If you don't receive a letter, please call our office to schedule the follow-up appointment.

## 2013-04-19 ENCOUNTER — Encounter: Payer: Self-pay | Admitting: Internal Medicine

## 2013-04-19 DIAGNOSIS — R55 Syncope and collapse: Secondary | ICD-10-CM | POA: Insufficient documentation

## 2013-04-19 NOTE — Progress Notes (Signed)
OFFICE NOTE  Chief Complaint:  Follow up appointment  Primary Care Physician: Londell Moh, MD  HPI:  Kimberly [name] is a 72 year old female who recently has been having some chest discomfort and shortness of breath as well as 2 episodes of syncope, both of which were during stressful situations, once while on a ladder. We went ahead and checked an echocardiogram which essentially shows only mild abnormalities. She did have a loudly calcified aortic valve which was not stenotic; however, there was mild regurgitation. There was mild LVH with EF 55% to 60% and grade 1 diastolic dysfunction. She had borderline pulmonary hypertension. A monitor was also worn by her from September 13, 2012 to October 12, 2012. This showed a predominantly sinus rhythm with PACs. There was, however, 1 episode of a nonsustained ventricular tachycardia which was about 5 beats. Of course she potentially would have to had longer episodes of NSVT this could have been playing a role in her syncopal event. I do not think she, however, is at high risk for lethal sustained VT given her normal ejection fraction and no clear evidence of ischemia. As you recall, she had a low-risk MET-test on April 02, 2012, with a peak VO2 of 77% predicted without any evidence of an ischemic response. This would make it quite unlikely that she has underlying coronary blockage that is responsible for her episode. I have, however, recommended adding a beta blocker to her regimen which will additionally help control her recent uncontrolled hypertension. This is Toprol XL 12.5 mg daily. It should help also with the VT. she is continuing to take low-dose Toprol and has had no further episodes of passing out in the past 6 months. She was thoroughly evaluated by a neurologist and felt to possibly be having a vasodepressive syncope. There was lower extreme swelling and compression stockings were recommended.  PMHx:  Past Medical History  Diagnosis Date   . Hypertension   . Thyroid disease   . GERD (gastroesophageal reflux disease)   . Cancer   . Dyslipidemia   . Aortic insufficiency   . Mitral insufficiency   . History of nuclear stress test 02/26/2006    exercise myoview; normal pattern of perfusion; low risk scan     Past Surgical History  Procedure Laterality Date  . Thyroidectomy  1973  . Joint replacement  2003  . Dilation and curettage of uterus    . Carpal tunnel release  1988  . Rotator cuff repair  2006  . H/o met test w/pft  04/02/2012    low risk; peak VO2 77% predicted  . Cardiac catheterization  07/08/2008    normal coronaries  . Transthoracic echocardiogram  09/25/2012    EF 55-60%; mild LVH & mild concentric hypertrophy; mild AV regurg; RV systolic pressure increase consistent with mild pulm HTN    FAMHx:  Family History  Problem Relation Age of Onset  . Lung cancer Mother   . Heart attack Father   . Heart failure Maternal Grandfather   . Heart attack Paternal Grandfather     SOCHx:   reports that she quit smoking about 45 years ago. Her smoking use included Cigarettes. She has a 3 pack-year smoking history. She has never used smokeless tobacco. She reports that  drinks alcohol. She reports that she does not use illicit drugs.  ALLERGIES:  Allergies  Allergen Reactions  . Milk-Related Compounds     ROS: A comprehensive review of systems was negative except for: Cardiovascular: positive for lower extremity  edema and syncope  HOME MEDS: Current Outpatient Prescriptions  Medication Sig Dispense Refill  . Albuterol (PROVENTIL IN) Inhale into the lungs as needed.      [name] amLODipine (NORVASC) 10 MG tablet Take 5 mg by mouth daily.       . budesonide-formoterol (SYMBICORT) 160-4.5 MCG/ACT inhaler Inhale 2 puffs into the lungs 2 (two) times daily.      [name] EPIPEN 2-PAK 0.3 MG/0.3ML SOAJ injection as needed.      [name] levothyroxine (SYNTHROID, LEVOTHROID) 137 MCG tablet Take 137 mcg by mouth daily.      .  metoprolol succinate (TOPROL-XL) 25 MG 24 hr tablet Take 12.5 mg by mouth daily.      . montelukast (SINGULAIR) 10 MG tablet Take 10 mg by mouth daily.      . Multiple Vitamins-Minerals (MULTIVITAMIN WITH MINERALS) tablet Take 1 tablet by mouth daily.      . pantoprazole (PROTONIX) 40 MG tablet Take 40 mg by mouth daily.      . ranitidine (ZANTAC) 300 MG tablet Take 300 mg by mouth at bedtime.      . Azilsartan-Chlorthalidone 40-12.5 MG TABS Take 1 tablet by mouth daily.  30 tablet  6   No current facility-administered medications for this visit.    LABS/IMAGING: No results found for this or any previous visit (from the past 48 hour(s)). No results found.  VITALS: BP 142/62  Pulse 63  Ht 5' (1.524 m)  Wt 185 lb 14.4 oz (84.324 kg)  BMI 36.31 kg/m2  EXAM: General appearance: alert and no distress Neck: no adenopathy, no carotid bruit, no JVD, supple, symmetrical, trachea midline and thyroid not enlarged, symmetric, no tenderness/mass/nodules Lungs: clear to auscultation bilaterally Heart: regular rate and rhythm, S1, S2 normal, no murmur, click, rub or gallop Abdomen: soft, non-tender; bowel sounds normal; no masses,  no organomegaly Extremities: extremities normal, atraumatic, no cyanosis or edema Pulses: 2+ and symmetric Skin: Skin color, texture, turgor normal. No rashes or lesions Neurologic: Grossly normal  EKG: Normal sinus rhythm at 63  ASSESSMENT: 1. History of syncope with none recently 2. Lower extremity edema possible vasodepressor syncope 3. Hypertension  PLAN: 1.   [name] has done fairly well without recurrence of her syncopal episodes. She is wearing compression stockings to lower extremities which seem to be helping her, however this could be fighting edema which may be related to her amlodipine. We may be able to reduce some of the edema by decreasing the dose of amlodipine however given the number of blood pressure medications she is on she'll need a more  potent blood pressure medication. I recommended changing her valsartan and HCTZ over to Edarbyclor 40/25. Hopefully, this will allow her to tolerate lower dose Norvasc and still maintain good blood pressure control.  Chrystie Nose, MD, [facility] Attending Cardiologist [facility]  Leilanny Fluitt C 04/19/2013, 1:20 PM

## 2013-04-24 ENCOUNTER — Encounter: Payer: Self-pay | Admitting: Internal Medicine

## 2013-05-07 ENCOUNTER — Telehealth: Payer: Self-pay | Admitting: Internal Medicine

## 2013-05-07 MED ORDER — IRBESARTAN-HYDROCHLOROTHIAZIDE 300-12.5 MG PO TABS: 1.0000 | ORAL_TABLET | Freq: Every day | ORAL | Status: DC

## 2013-05-07 NOTE — Telephone Encounter (Signed)
Returned call.  Left message to call back before 4pm.  

## 2013-05-07 NOTE — Telephone Encounter (Signed)
Returned call.  Pt stated Dr. Rennis Golden put her on Edardyclor b/c she was swelling so much and it is working.  Stated BP has been 135-140/~67.  Stated her problem is that it will cost her $95/ mo and she cannot afford it.  Stated saw PCP and labs sent to Dr. Rennis Golden b/c they were abnormal.   Stated she was started on Lipitor b/c BG was elevated and cholesterol too.  PCP wondered if losartan would work the same as the other drug and pt could switch to that.  Stated PCP said he would leave it up to Dr. Rennis Golden.  If so, pt would like Rx sent to [facility] [address]  Pt informed Dr. Rennis Golden will be notified.  Pt verbalized understanding and agreed w/ plan.  Pt has about 6 days left of med.  Message forwarded to Dr. Rennis Golden.

## 2013-05-07 NOTE — Telephone Encounter (Signed)
Losartan is not strong enough to keep her BP down, even in combination with a thiazide. The only other option would be irbesartan/HCTZ, which is generic. I would prefer if she stays on the Edarbyclor, but if it is not affordable, try irbesartan/hctz 300/12.5 mg daily.  -Dr. Rennis Golden

## 2013-05-07 NOTE — Telephone Encounter (Signed)
Pt is calling regarding a medication for Edardyclor bc of swelling and it works but it is expensive. Wants to know if she can be put on Losartan would work as well. Her BP has been staying low. She would like to talk to someone about the this.

## 2013-05-07 NOTE — Telephone Encounter (Signed)
Returning your call. °

## 2013-05-07 NOTE — Telephone Encounter (Signed)
Returned call and informed pt per instructions by MD/PA.  Pt verbalized understanding and agreed w/ plan.  Pt wanted to change to alternative med, irbesartan/hctz.   Rx sent to pharmacy.

## 2013-05-23 ENCOUNTER — Ambulatory Visit (INDEPENDENT_AMBULATORY_CARE_PROVIDER_SITE_OTHER): Payer: Medicare Other | Admitting: Internal Medicine

## 2013-05-23 ENCOUNTER — Encounter: Payer: Self-pay | Admitting: Internal Medicine

## 2013-05-23 VITALS — BP 144/82 | HR 62 | Ht 59.5 in | Wt 180.9 lb

## 2013-05-23 DIAGNOSIS — I10 Essential (primary) hypertension: Secondary | ICD-10-CM

## 2013-05-23 DIAGNOSIS — R55 Syncope and collapse: Secondary | ICD-10-CM

## 2013-05-23 DIAGNOSIS — H3400 Transient retinal artery occlusion, unspecified eye: Secondary | ICD-10-CM

## 2013-05-23 DIAGNOSIS — G453 Amaurosis fugax: Secondary | ICD-10-CM

## 2013-05-23 DIAGNOSIS — E785 Hyperlipidemia, unspecified: Secondary | ICD-10-CM

## 2013-05-23 NOTE — Patient Instructions (Addendum)
Keep your scheduled appointment with Dr. Rennis Golden

## 2013-05-23 NOTE — Progress Notes (Signed)
OFFICE NOTE  Chief Complaint:  Follow up appointment  Primary Care Physician: Londell Moh, MD  HPI:  Kimberly [name] is a 72 year old female who recently has been having some chest discomfort and shortness of breath as well as 2 episodes of syncope, both of which were during stressful situations, once while on a ladder. We went ahead and checked an echocardiogram which essentially shows only mild abnormalities. She did have a loudly calcified aortic valve which was not stenotic; however, there was mild regurgitation. There was mild LVH with EF 55% to 60% and grade 1 diastolic dysfunction. She had borderline pulmonary hypertension. A monitor was also worn by her from September 13, 2012 to October 12, 2012. This showed a predominantly sinus rhythm with PACs. There was, however, 1 episode of a nonsustained ventricular tachycardia which was about 5 beats. Of course she potentially would have to had longer episodes of NSVT this could have been playing a role in her syncopal event. I do not think she, however, is at high risk for lethal sustained VT given her normal ejection fraction and no clear evidence of ischemia. As you recall, she had a low-risk MET-test on April 02, 2012, with a peak VO2 of 77% predicted without any evidence of an ischemic response. This would make it quite unlikely that she has underlying coronary blockage that is responsible for her episode. I have, however, recommended adding a beta blocker to her regimen which will additionally help control her recent uncontrolled hypertension. This is Toprol XL 12.5 mg daily. It should help also with the VT. she is continuing to take low-dose Toprol and has had no further episodes of passing out in the past 6 months. She was thoroughly evaluated by a neurologist and felt to possibly be having a vasodepressive syncope. There was lower extremity.swelling and compression stockings were recommended.  She's not had any further syncope  episodes, but she did report off and when seeing her optometrist recently that she has some "floaters" which have occasionally cause loss of vision in her eyes. 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 She was then encouraged to see me again for possible cardiac causes of her vision loss.  PMHx:  Past Medical History  Diagnosis Date  . Hypertension   . Thyroid disease   . GERD (gastroesophageal reflux disease)   . Cancer   . Dyslipidemia   . Aortic insufficiency   . Mitral insufficiency   . History of nuclear stress test 02/26/2006    exercise myoview; normal pattern of perfusion; low risk scan     Past Surgical History  Procedure Laterality Date  . Thyroidectomy  1973  . Joint replacement  2003  . Dilation and curettage of uterus    . Carpal tunnel release  1988  . Rotator cuff repair  2006  . H/o met test w/pft  04/02/2012    low risk; peak VO2 77% predicted  . Cardiac catheterization  07/08/2008    normal coronaries  . Transthoracic echocardiogram  09/25/2012    EF 55-60%; mild LVH & mild concentric hypertrophy; mild AV regurg; RV systolic pressure increase consistent with mild pulm HTN    FAMHx:  Family History  Problem Relation Age of Onset  . Lung cancer Mother   . Heart attack Father   . Heart failure Maternal Grandfather   . Heart attack Paternal Grandfather  SOCHx:   reports that she quit smoking about 45 years ago. Her smoking use included Cigarettes. She has a 3 pack-year smoking history. She has never used smokeless tobacco. She reports that  drinks alcohol. She reports that she does not use illicit drugs.  ALLERGIES:  Allergies  Allergen Reactions  . Milk-Related Compounds     GI upset    ROS: A comprehensive review of systems was negative except for: Cardiovascular: positive for lower extremity edema and syncope  HOME  MEDS: Current Outpatient Prescriptions  Medication Sig Dispense Refill  . Albuterol (PROVENTIL IN) Inhale into the lungs as needed.      [name] amLODipine (NORVASC) 10 MG tablet Take 5 mg by mouth daily.       [name] aspirin 81 MG tablet Take 81 mg by mouth daily.      [name] atorvastatin (LIPITOR) 20 MG tablet Take 1 tablet by mouth daily.      . budesonide-formoterol (SYMBICORT) 160-4.5 MCG/ACT inhaler Inhale 2 puffs into the lungs 2 (two) times daily.      [name] EPIPEN 2-PAK 0.3 MG/0.3ML SOAJ injection as needed.      . irbesartan-hydrochlorothiazide (AVALIDE) 300-12.5 MG per tablet Take 1 tablet by mouth daily.  30 tablet  2  . levothyroxine (SYNTHROID, LEVOTHROID) 125 MCG tablet Take 125 mcg by mouth daily before breakfast.      . metoprolol succinate (TOPROL-XL) 25 MG 24 hr tablet Take 12.5 mg by mouth daily.      . montelukast (SINGULAIR) 10 MG tablet Take 10 mg by mouth daily.      . Multiple Vitamins-Minerals (MULTIVITAMIN WITH MINERALS) tablet Take 1 tablet by mouth daily.      . pantoprazole (PROTONIX) 40 MG tablet Take 40 mg by mouth 2 (two) times daily.       . ranitidine (ZANTAC) 300 MG tablet Take 300 mg by mouth at bedtime.       No current facility-administered medications for this visit.    LABS/IMAGING: No results found for this or any previous visit (from the past 48 hour(s)). No results found.  VITALS: BP 144/82  Pulse 62  Ht 4' 11.5" (1.511 m)  Wt 180 lb 14.4 oz (82.056 kg)  BMI 35.94 kg/m2  EXAM: deferred  EKG: deferred  ASSESSMENT: 1. 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 History of syncope with none recently 3. Lower extremity edema possible vasodepressor syncope 4. Hypertension  PLAN: 1.   [name] was just seen for evaluation of syncope. In addition she's been followed closely by a neurologist who did extensive workup was unable to find a source of her syncope. It is thought to be vasopressor. With regards to the episodes of vision loss in the left eye, she reports having  had about 4 times in the past 2 years. One time was with driving. She has had an extensive recent workup including wearing a monitor which demonstrated short an SVT, but no episodes of atrial fibrillation. She had an echocardiogram which showed a mildly calcified, but not stenotic aortic valve. She is also had carotid Dopplers in the recent past which showed no significant carotid disease. I do not see a clear cardiac source for these episodes, however it is feasible that she could be breaking off small pieces of calcium from the aortic valve. In addition some of her symptoms could be due to spasm of the retinal artery or be related to periods of hypotension such as when she has vasopressor syncopal episodes. One would expect this however  to affect both eyes. I think it is reasonable to consider taking low-dose daily aspirin to see if these episodes can be prevented. I've asked her to keep track of her symptoms if further episodes should occur and notify me.  Thank you for bringing this issue to my attention.  Chrystie Nose, MD, [facility] Attending Cardiologist [facility]  Areya Lemmerman C 05/23/2013, 3:58 PM

## 2013-07-10 ENCOUNTER — Other Ambulatory Visit: Payer: Self-pay

## 2013-07-27 ENCOUNTER — Other Ambulatory Visit: Payer: Self-pay | Admitting: Internal Medicine

## 2013-07-28 NOTE — Telephone Encounter (Signed)
Rx was sent to pharmacy electronically. 

## 2013-09-19 ENCOUNTER — Encounter (HOSPITAL_COMMUNITY): Payer: Self-pay | Admitting: Emergency Medicine

## 2013-09-19 ENCOUNTER — Emergency Department (HOSPITAL_COMMUNITY): Payer: Medicare Other

## 2013-09-19 ENCOUNTER — Inpatient Hospital Stay (HOSPITAL_COMMUNITY)
Admission: EM | Admit: 2013-09-19 | Discharge: 2013-09-20 | DRG: 194 | Disposition: A | Discharge status: Home / Self Care | Payer: Medicare Other | Attending: Internal Medicine | Admitting: Internal Medicine

## 2013-09-19 DIAGNOSIS — E890 Postprocedural hypothyroidism: Secondary | ICD-10-CM | POA: Diagnosis present

## 2013-09-19 DIAGNOSIS — E785 Hyperlipidemia, unspecified: Secondary | ICD-10-CM | POA: Diagnosis present

## 2013-09-19 DIAGNOSIS — J45909 Unspecified asthma, uncomplicated: Secondary | ICD-10-CM | POA: Diagnosis present

## 2013-09-19 DIAGNOSIS — G453 Amaurosis fugax: Secondary | ICD-10-CM

## 2013-09-19 DIAGNOSIS — R042 Hemoptysis: Secondary | ICD-10-CM | POA: Diagnosis present

## 2013-09-19 DIAGNOSIS — I10 Essential (primary) hypertension: Secondary | ICD-10-CM | POA: Diagnosis present

## 2013-09-19 DIAGNOSIS — D72829 Elevated white blood cell count, unspecified: Secondary | ICD-10-CM | POA: Diagnosis present

## 2013-09-19 DIAGNOSIS — J189 Pneumonia, unspecified organism: Principal | ICD-10-CM | POA: Diagnosis present

## 2013-09-19 DIAGNOSIS — Z801 Family history of malignant neoplasm of trachea, bronchus and lung: Secondary | ICD-10-CM

## 2013-09-19 DIAGNOSIS — I059 Rheumatic mitral valve disease, unspecified: Secondary | ICD-10-CM | POA: Diagnosis present

## 2013-09-19 DIAGNOSIS — J9809 Other diseases of bronchus, not elsewhere classified: Secondary | ICD-10-CM | POA: Diagnosis present

## 2013-09-19 DIAGNOSIS — Z87891 Personal history of nicotine dependence: Secondary | ICD-10-CM

## 2013-09-19 DIAGNOSIS — R55 Syncope and collapse: Secondary | ICD-10-CM

## 2013-09-19 DIAGNOSIS — K219 Gastro-esophageal reflux disease without esophagitis: Secondary | ICD-10-CM | POA: Diagnosis present

## 2013-09-19 DIAGNOSIS — J984 Other disorders of lung: Secondary | ICD-10-CM | POA: Diagnosis present

## 2013-09-19 DIAGNOSIS — E039 Hypothyroidism, unspecified: Secondary | ICD-10-CM | POA: Diagnosis present

## 2013-09-19 DIAGNOSIS — J309 Allergic rhinitis, unspecified: Secondary | ICD-10-CM | POA: Diagnosis present

## 2013-09-19 DIAGNOSIS — I359 Nonrheumatic aortic valve disorder, unspecified: Secondary | ICD-10-CM | POA: Diagnosis present

## 2013-09-19 DIAGNOSIS — J9811 Atelectasis: Secondary | ICD-10-CM | POA: Diagnosis present

## 2013-09-19 DIAGNOSIS — R0602 Shortness of breath: Secondary | ICD-10-CM

## 2013-09-19 DIAGNOSIS — J9819 Other pulmonary collapse: Secondary | ICD-10-CM | POA: Diagnosis present

## 2013-09-19 LAB — HEPATIC FUNCTION PANEL
ALT: 23 U/L (ref 0–35)
AST: 22 U/L (ref 0–37)
Albumin: 3.8 g/dL (ref 3.5–5.2)
Alkaline Phosphatase: 119 U/L — ABNORMAL HIGH (ref 39–117)
Bilirubin, Direct: <0.2 mg/dL (ref 0.0–0.3)
TOTAL PROTEIN: 7.2 g/dL (ref 6.0–8.3)
Total Bilirubin: 0.4 mg/dL (ref 0.3–1.2)

## 2013-09-19 LAB — CBC
HCT: 34.8 % — ABNORMAL LOW (ref 36.0–46.0)
HEMOGLOBIN: 12.1 g/dL (ref 12.0–15.0)
MCH: 30.8 pg (ref 26.0–34.0)
MCHC: 34.8 g/dL (ref 30.0–36.0)
MCV: 88.5 fL (ref 78.0–100.0)
Platelets: 468 10*3/uL — ABNORMAL HIGH (ref 150–400)
RBC: 3.93 MIL/uL (ref 3.87–5.11)
RDW: 12.8 % (ref 11.5–15.5)
WBC: 13.5 10*3/uL — ABNORMAL HIGH (ref 4.0–10.5)

## 2013-09-19 LAB — MAGNESIUM: Magnesium: 1.6 mg/dL (ref 1.5–2.5)

## 2013-09-19 LAB — URINALYSIS, ROUTINE W REFLEX MICROSCOPIC
Bilirubin Urine: NEGATIVE
GLUCOSE, UA: NEGATIVE mg/dL
Hgb urine dipstick: NEGATIVE
KETONES UR: NEGATIVE mg/dL
LEUKOCYTES UA: NEGATIVE
NITRITE: NEGATIVE
PROTEIN: NEGATIVE mg/dL
Specific Gravity, Urine: 1.015 (ref 1.005–1.030)
UROBILINOGEN UA: 0.2 mg/dL (ref 0.0–1.0)
pH: 5.5 (ref 5.0–8.0)

## 2013-09-19 LAB — BASIC METABOLIC PANEL
BUN: 25 mg/dL — ABNORMAL HIGH (ref 6–23)
CO2: 25 meq/L (ref 19–32)
Calcium: 9.9 mg/dL (ref 8.4–10.5)
Chloride: 97 mEq/L (ref 96–112)
Creatinine, Ser: 0.9 mg/dL (ref 0.50–1.10)
GFR calc Af Amer: 73 mL/min — ABNORMAL LOW (ref >90)
GFR calc non Af Amer: 63 mL/min — ABNORMAL LOW (ref >90)
GLUCOSE: 96 mg/dL (ref 70–99)
POTASSIUM: 4.4 meq/L (ref 3.7–5.3)
SODIUM: 136 meq/L — AB (ref 137–147)

## 2013-09-19 LAB — PROTIME-INR
INR: 0.92 (ref 0.00–1.49)
PROTHROMBIN TIME: 12.2 s (ref 11.6–15.2)

## 2013-09-19 LAB — APTT: APTT: 28 s (ref 24–37)

## 2013-09-19 LAB — HEMOGLOBIN AND HEMATOCRIT, BLOOD
HCT: 34 % — ABNORMAL LOW (ref 36.0–46.0)
HEMOGLOBIN: 11.6 g/dL — AB (ref 12.0–15.0)

## 2013-09-19 LAB — STREP PNEUMONIAE URINARY ANTIGEN: Strep Pneumo Urinary Antigen: NEGATIVE

## 2013-09-19 MED ORDER — MAGNESIUM CITRATE PO SOLN
1.0000 | Freq: Once | ORAL | Status: AC | PRN
  Filled 2013-09-19: qty 296

## 2013-09-19 MED ORDER — HYDROCHLOROTHIAZIDE 12.5 MG PO CAPS
12.5000 mg | ORAL_CAPSULE | Freq: Every day | ORAL | Status: DC
  Administered 2013-09-19 – 2013-09-20 (×2): 12.5 mg via ORAL
  Filled 2013-09-19 (×2): qty 1

## 2013-09-19 MED ORDER — LEVOFLOXACIN IN D5W 750 MG/150ML IV SOLN
750.0000 mg | INTRAVENOUS | Status: DC
  Filled 2013-09-19: qty 150

## 2013-09-19 MED ORDER — SODIUM CHLORIDE 0.9 % IV SOLN: INTRAVENOUS | Status: DC

## 2013-09-19 MED ORDER — SODIUM CHLORIDE 0.9 % IV SOLN
INTRAVENOUS | Status: DC
  Administered 2013-09-20: 03:00:00 via INTRAVENOUS

## 2013-09-19 MED ORDER — ALBUTEROL SULFATE HFA 108 (90 BASE) MCG/ACT IN AERS
1.0000 | INHALATION_SPRAY | Freq: Four times a day (QID) | RESPIRATORY_TRACT | Status: DC | PRN
  Filled 2013-09-19: qty 6.7

## 2013-09-19 MED ORDER — MONTELUKAST SODIUM 10 MG PO TABS
10.0000 mg | ORAL_TABLET | Freq: Every day | ORAL | Status: DC
  Administered 2013-09-19 – 2013-09-20 (×2): 10 mg via ORAL
  Filled 2013-09-19 (×2): qty 1

## 2013-09-19 MED ORDER — IRBESARTAN-HYDROCHLOROTHIAZIDE 300-12.5 MG PO TABS: 1.0000 | ORAL_TABLET | Freq: Every day | ORAL | Status: DC

## 2013-09-19 MED ORDER — SORBITOL 70 % SOLN: 30.0000 mL | Freq: Every day | Status: DC | PRN

## 2013-09-19 MED ORDER — ALBUTEROL SULFATE (2.5 MG/3ML) 0.083% IN NEBU
2.5000 mg | INHALATION_SOLUTION | RESPIRATORY_TRACT | Status: DC | PRN
Start: 2013-09-19 — End: 2013-09-20

## 2013-09-19 MED ORDER — IRBESARTAN 300 MG PO TABS
300.0000 mg | ORAL_TABLET | Freq: Every day | ORAL | Status: DC
  Administered 2013-09-19 – 2013-09-20 (×2): 300 mg via ORAL
  Filled 2013-09-19 (×2): qty 1

## 2013-09-19 MED ORDER — ALBUTEROL SULFATE (2.5 MG/3ML) 0.083% IN NEBU: 2.5000 mg | INHALATION_SOLUTION | Freq: Four times a day (QID) | RESPIRATORY_TRACT | Status: DC | PRN

## 2013-09-19 MED ORDER — AMLODIPINE BESYLATE 5 MG PO TABS
5.0000 mg | ORAL_TABLET | Freq: Every day | ORAL | Status: DC
  Administered 2013-09-19 – 2013-09-20 (×2): 5 mg via ORAL
  Filled 2013-09-19 (×2): qty 1

## 2013-09-19 MED ORDER — SODIUM CHLORIDE 0.9 % IV SOLN
INTRAVENOUS | Status: DC
  Administered 2013-09-19: 17:00:00 via INTRAVENOUS

## 2013-09-19 MED ORDER — SODIUM CHLORIDE 0.9 % IJ SOLN
3.0000 mL | Freq: Two times a day (BID) | INTRAMUSCULAR | Status: DC
  Administered 2013-09-19 – 2013-09-20 (×2): 3 mL via INTRAVENOUS

## 2013-09-19 MED ORDER — ACETAMINOPHEN 650 MG RE SUPP: 650.0000 mg | Freq: Four times a day (QID) | RECTAL | Status: DC | PRN

## 2013-09-19 MED ORDER — POLYETHYLENE GLYCOL 3350 17 G PO PACK
17.0000 g | PACK | Freq: Every day | ORAL | Status: DC | PRN
  Filled 2013-09-19: qty 1

## 2013-09-19 MED ORDER — ONDANSETRON HCL 4 MG PO TABS: 4.0000 mg | ORAL_TABLET | Freq: Four times a day (QID) | ORAL | Status: DC | PRN

## 2013-09-19 MED ORDER — LEVOTHYROXINE SODIUM 125 MCG PO TABS
125.0000 ug | ORAL_TABLET | Freq: Every day | ORAL | Status: DC
  Administered 2013-09-20: 125 ug via ORAL
  Filled 2013-09-19 (×2): qty 1

## 2013-09-19 MED ORDER — METOPROLOL SUCCINATE 12.5 MG HALF TABLET
12.5000 mg | ORAL_TABLET | Freq: Every day | ORAL | Status: DC
  Administered 2013-09-20: 12.5 mg via ORAL
  Filled 2013-09-19: qty 1

## 2013-09-19 MED ORDER — ONDANSETRON HCL 4 MG/2ML IJ SOLN: 4.0000 mg | Freq: Four times a day (QID) | INTRAMUSCULAR | Status: DC | PRN

## 2013-09-19 MED ORDER — ALBUTEROL SULFATE HFA 108 (90 BASE) MCG/ACT IN AERS: 1.0000 | INHALATION_SPRAY | Freq: Four times a day (QID) | RESPIRATORY_TRACT | Status: DC | PRN

## 2013-09-19 MED ORDER — FAMOTIDINE 40 MG PO TABS
40.0000 mg | ORAL_TABLET | Freq: Every day | ORAL | Status: DC
  Administered 2013-09-19: 40 mg via ORAL
  Filled 2013-09-19 (×2): qty 1

## 2013-09-19 MED ORDER — LEVOFLOXACIN IN D5W 750 MG/150ML IV SOLN
750.0000 mg | Freq: Once | INTRAVENOUS | Status: AC
  Administered 2013-09-19: 750 mg via INTRAVENOUS
  Filled 2013-09-19: qty 150

## 2013-09-19 MED ORDER — ACETAMINOPHEN 325 MG PO TABS: 650.0000 mg | ORAL_TABLET | Freq: Four times a day (QID) | ORAL | Status: DC | PRN

## 2013-09-19 MED ORDER — PANTOPRAZOLE SODIUM 40 MG PO TBEC
40.0000 mg | DELAYED_RELEASE_TABLET | Freq: Two times a day (BID) | ORAL | Status: DC
  Administered 2013-09-19 – 2013-09-20 (×2): 40 mg via ORAL
  Filled 2013-09-19 (×2): qty 1

## 2013-09-19 MED ORDER — BENZONATATE 100 MG PO CAPS
100.0000 mg | ORAL_CAPSULE | Freq: Three times a day (TID) | ORAL | Status: DC | PRN
  Administered 2013-09-20: 100 mg via ORAL
  Filled 2013-09-19: qty 1

## 2013-09-19 MED ORDER — BUDESONIDE-FORMOTEROL FUMARATE 160-4.5 MCG/ACT IN AERO
2.0000 | INHALATION_SPRAY | Freq: Two times a day (BID) | RESPIRATORY_TRACT | Status: DC
  Administered 2013-09-20: 2 via RESPIRATORY_TRACT
  Filled 2013-09-19: qty 6

## 2013-09-19 NOTE — ED Provider Notes (Signed)
CSN: [id number]     Arrival date & time 09/19/13  57 History   First MD Initiated Contact with Patient 09/19/13 1119     Chief Complaint  Patient presents with  . Hemoptysis   (Consider location/radiation/quality/duration/timing/severity/associated sxs/prior Treatment) The history is provided by the patient.   73 year old female history of pneumonia about 2 weeks ago finished antibiotics for that last week slowly was getting better but started with a recurrent cough yesterday started coughing up blood today without any mucus yesterday was coughing up a lot of mucus. Patient denies fever pain or shortness of breath just feels fatigued. Patient was struggling with upper rest for infection since Thanksgiving timeframe.  Past Medical History  Diagnosis Date  . Hypertension   . Thyroid disease   . GERD (gastroesophageal reflux disease)   . Cancer   . Dyslipidemia   . Aortic insufficiency   . Mitral insufficiency   . History of nuclear stress test 02/26/2006    exercise myoview; normal pattern of perfusion; low risk scan    Past Surgical History  Procedure Laterality Date  . Thyroidectomy  1973  . Joint replacement  2003  . Dilation and curettage of uterus    . Carpal tunnel release  1988  . Rotator cuff repair  2006  . H/o met test w/pft  04/02/2012    low risk; peak VO2 77% predicted  . Cardiac catheterization  07/08/2008    normal coronaries  . Transthoracic echocardiogram  09/25/2012    EF 55-60%; mild LVH & mild concentric hypertrophy; mild AV regurg; RV systolic pressure increase consistent with mild pulm HTN   Family History  Problem Relation Age of Onset  . Lung cancer Mother   . Heart attack Father   . Heart failure Maternal Grandfather   . Heart attack Paternal Grandfather    History  Substance Use Topics  . Smoking status: Former Smoker -- 0.50 packs/day for 6 years    Types: Cigarettes    Quit date: 09/05/1967  . Smokeless tobacco: Never Used  . Alcohol Use: Yes      Comment: occasional    OB History   Grav Para Term Preterm Abortions TAB SAB Ect Mult Living                 Review of Systems  Constitutional: Positive for fatigue. Negative for fever.  HENT: Negative for congestion.   Eyes: Negative for redness.  Respiratory: Positive for cough. Negative for shortness of breath.   Gastrointestinal: Negative for nausea, vomiting and abdominal pain.  Genitourinary: Negative for dysuria.  Musculoskeletal: Negative for back pain.  Skin: Negative for rash.  Neurological: Negative for headaches.  Hematological: Does not bruise/bleed easily.  Psychiatric/Behavioral: Negative for confusion.    Allergies  Milk-related compounds  Home Medications  No current outpatient prescriptions on file. BP 168/79  Pulse 68  Temp(Src) 98 F (36.7 C) (Oral)  Resp 18  Ht 5' (1.524 m)  Wt 165 lb 5.5 oz (75 kg)  BMI 32.29 kg/m2  SpO2 94% Physical Exam  Nursing note and vitals reviewed. Constitutional: She is oriented to person, place, and time. She appears well-developed and well-nourished. No distress.  HENT:  Head: Normocephalic.  Mouth/Throat: Oropharynx is clear and moist. No oropharyngeal exudate.  Eyes: Conjunctivae and EOM are normal. Pupils are equal, round, and reactive to light.  Neck: Normal range of motion.  Cardiovascular: Normal rate, regular rhythm and normal heart sounds.   No murmur heard. Pulmonary/Chest: Effort normal and  breath sounds normal. No respiratory distress.  Abdominal: Soft. Bowel sounds are normal. There is no tenderness.  Musculoskeletal: Normal range of motion.  Neurological: She is alert and oriented to person, place, and time. No cranial nerve deficit. She exhibits normal muscle tone. Coordination normal.  Skin: Skin is warm. No rash noted.    ED Course  Procedures (including critical care time) Labs Review Labs Reviewed  CBC - Abnormal; Notable for the following:    WBC 13.5 (*)    HCT 34.8 (*)    Platelets  468 (*)    All other components within normal limits  BASIC METABOLIC PANEL - Abnormal; Notable for the following:    Sodium 136 (*)    BUN 25 (*)    GFR calc non Af Amer 63 (*)    GFR calc Af Amer 73 (*)    All other components within normal limits  HEPATIC FUNCTION PANEL - Abnormal; Notable for the following:    Alkaline Phosphatase 119 (*)    All other components within normal limits  HEMOGLOBIN AND HEMATOCRIT, BLOOD - Abnormal; Notable for the following:    Hemoglobin 11.6 (*)    HCT 34.0 (*)    All other components within normal limits  CULTURE, EXPECTORATED SPUTUM-ASSESSMENT  URINE CULTURE  APTT  PROTIME-INR  URINALYSIS, ROUTINE W REFLEX MICROSCOPIC  LEGIONELLA ANTIGEN, URINE  STREP PNEUMONIAE URINARY ANTIGEN  MAGNESIUM  COMPREHENSIVE METABOLIC PANEL  CBC  PROTIME-INR  APTT   Results for orders placed during the hospital encounter of 09/19/13  CBC      Result Value Range   WBC 13.5 (*) 4.0 - 10.5 K/uL   RBC 3.93  3.87 - 5.11 MIL/uL   Hemoglobin 12.1  12.0 - 15.0 g/dL   HCT 34.8 (*) 36.0 - 46.0 %   MCV 88.5  78.0 - 100.0 fL   MCH 30.8  26.0 - 34.0 pg   MCHC 34.8  30.0 - 36.0 g/dL   RDW 12.8  11.5 - 15.5 %   Platelets 468 (*) 150 - 400 K/uL  BASIC METABOLIC PANEL      Result Value Range   Sodium 136 (*) 137 - 147 mEq/L   Potassium 4.4  3.7 - 5.3 mEq/L   Chloride 97  96 - 112 mEq/L   CO2 25  19 - 32 mEq/L   Glucose, Bld 96  70 - 99 mg/dL   BUN 25 (*) 6 - 23 mg/dL   Creatinine, Ser 0.90  0.50 - 1.10 mg/dL   Calcium 9.9  8.4 - 10.5 mg/dL   GFR calc non Af Amer 63 (*) >90 mL/min   GFR calc Af Amer 73 (*) >90 mL/min  HEPATIC FUNCTION PANEL      Result Value Range   Total Protein 7.2  6.0 - 8.3 g/dL   Albumin 3.8  3.5 - 5.2 g/dL   AST 22  0 - 37 U/L   ALT 23  0 - 35 U/L   Alkaline Phosphatase 119 (*) 39 - 117 U/L   Total Bilirubin 0.4  0.3 - 1.2 mg/dL   Bilirubin, Direct <0.2  0.0 - 0.3 mg/dL   Indirect Bilirubin NOT CALCULATED  0.3 - 0.9 mg/dL  HEMOGLOBIN  AND HEMATOCRIT, BLOOD      Result Value Range   Hemoglobin 11.6 (*) 12.0 - 15.0 g/dL   HCT 34.0 (*) 36.0 - 46.0 %  APTT      Result Value Range   aPTT 28  24 -  37 seconds  PROTIME-INR      Result Value Range   Prothrombin Time 12.2  11.6 - 15.2 seconds   INR 0.92  0.00 - 1.49  URINALYSIS, ROUTINE W REFLEX MICROSCOPIC      Result Value Range   Color, Urine YELLOW  YELLOW   APPearance CLEAR  CLEAR   Specific Gravity, Urine 1.015  1.005 - 1.030   pH 5.5  5.0 - 8.0   Glucose, UA NEGATIVE  NEGATIVE mg/dL   Hgb urine dipstick NEGATIVE  NEGATIVE   Bilirubin Urine NEGATIVE  NEGATIVE   Ketones, ur NEGATIVE  NEGATIVE mg/dL   Protein, ur NEGATIVE  NEGATIVE mg/dL   Urobilinogen, UA 0.2  0.0 - 1.0 mg/dL   Nitrite NEGATIVE  NEGATIVE   Leukocytes, UA NEGATIVE  NEGATIVE    Imaging Review Dg Chest 2 View  09/19/2013   CLINICAL DATA:  Hemoptysis.  Recent pneumonia  EXAM: CHEST  2 VIEW  COMPARISON:  None.  FINDINGS: Right middle lobe consolidation with volume loss and opacity. Left lung is clear. No effusion. No heart failure.  IMPRESSION: Right middle lobe consolidation. This could represent pneumonia. Postobstructive collapse is possible. Followup until clearing is suggested.   Electronically Signed   By: Franchot Gallo M.D.   On: 09/19/2013 12:13   Ct Chest Wo Contrast  09/19/2013   CLINICAL DATA:  73 year old female recently finished treatment for pneumonia. Lobe today with hemoptysis. Initial encounter.  EXAM: CT CHEST WITHOUT CONTRAST  TECHNIQUE: Multidetector CT imaging of the chest was performed following the standard protocol without IV contrast.  COMPARISON:  Chest radiographs from earlier today. [facility] medical Chest radiographs 09/03/2013. [facility] chest CTA 05/31/2011.  FINDINGS: Radiographs demonstrate recent confluent right middle lobe versus inferior upper lobe consolidation.  The trachea and proximal airways are patent. There is abrupt termination of gas within  the right middle lobe bronchus just after it emerges from the bronchus intermedius (series 3, image 28). There is then complete right middle lobe collapse, or consolidation but no additional air bronchograms.  No pericardial or pleural effusion. No contrast administered. No definite hilar or mediastinal lymphadenopathy.  No nodularity elsewhere in the right lung. Other Major airways are patent. Left lung is stable with multiple left lower lobe lung nodules, several densely calcified (series 3, image 40, 43) compatible with chronic granulomas.  Chronic postoperative changes to the left thyroid. Chronic mid thoracic disc and endplate degeneration. No acute or suspicious osseous lesion identified.  Negative visualized non contrast liver, gallbladder, spleen, pancreas, adrenal glands, kidneys common bowel.  IMPRESSION: 1. Complete drowning or collapse of the right middle lobe with abrupt termination of the right middle lobe bronchus just beyond its origin (series 3, image 28). Constellation of clinical and imaging data highly suspicious for an obstructing bronchial lesion. Recommend bronchoscopy. 2. Otherwise negative right lung. No definite hilar or mediastinal lymphadenopathy. 3. Otherwise stable appearance of the chest since 2012, including right lower lobe granulomas.   Electronically Signed   By: Lars Pinks M.D.   On: 09/19/2013 15:19    EKG Interpretation    Date/Time:    Ventricular Rate:    PR Interval:    QRS Duration:   QT Interval:    QTC Calculation:   R Axis:     Text Interpretation:              MDM   1. Hemoptysis   2. Bronchial obstruction   3. Collapse of right lung  4. Unspecified essential hypertension    Patient with significant hemoptysis. However stable. Chest x-ray and CT showed right middle lobe opacification could represent mass blood or pneumonia. Patient started on IV Levaquin. Hemoglobin and hematocrit stable vital signs stable. Patient's been struggling with  upper respiratory infections since Thanksgiving also recently treated for pneumonia based on chest x-ray by her primary care doctor who is Dr. Shelia Media.  Patient will be admitted to telemetry by the hospitalist team.    Mervin Kung, MD 09/19/13 2021

## 2013-09-19 NOTE — ED Notes (Signed)
Pt just finished treatment for pneumonia last week then woke today and was coughing up blood. Denies fevers, pain, or sob. States she just "feels tired."

## 2013-09-19 NOTE — H&P (Signed)
[facility] History and Physical  Kimberly [name] OZH:[id number] DOB: 07-13-1941 DOA: 09/19/2013  Referring physician: Dr. Venita Sheffield PCP: [name], MD   Chief Complaint: Coughing up blood  HPI: Kimberly [name] is a 73 y.o. female  With past medical history of hypertension, history of thyroid disease status post thyroidectomy secondary to precancerous thyroid nodules on Synthroid, hyperlipidemia, aortic and mitral insufficiency, chronic shortness of breath we'll presents to the ED with hemoptysis that started on the morning of admission. Patient states that one day after Thanksgiving she developed a viral illness of nausea vomiting diarrhea which also led to bronchitis was treated with a Z-Pak then. Patient states 1-2 weeks prior to admission she felt sick had fevers were to see a PCP who did a chest x-ray and was diagnosed with a pneumonia. Patient stated that she was treated with 7 days worth of Avelox and the symptoms improved. Patient states that she will cup at 5 AM on the morning of admission with hemoptysis that lasted around 9:30 AM which was constant in nature. Patient called her PCP and was told to present to the ED. Patient denies any fevers, no chills, no nausea, no vomiting, no chest pain, no shortness of breath, no abdominal pain. Patient does endorse some semi-loose stools, generalized fatigue, generalized weakness. Patient denies any dysuria, no melena, no hematemesis, no hematochezia. Patient does state that she's lost about 5 pounds over the past 3 weeks. Patient was seen in the ED comparison metabolic profile which was done had a sodium of 136 alk phosphatase of 119 otherwise was within normal limits. CBC done had a white count of 13.5 and platelet count of 468 otherwise was within normal limits. Chest x-ray does show right middle lobe consolidation which could represent a pneumonia postobstructive collapse possible. CT chest which was done showed complete trauma  no collapse of the right middle lobe with abrupt termination of the right middle lobe bronchus just beyond its origin concerning for obstructing bronchial lesion. No definite hilar or mediastinal lymphadenopathy noted. Patient was given IV Levaquin. We were called to admit the patient for further evaluation and management.  Review of Systems: Per history of present illness otherwise negative. Constitutional:  No weight loss, night sweats, Fevers, chills, fatigue.  HEENT:  No headaches, Difficulty swallowing,Tooth/dental problems,Sore throat,  No sneezing, itching, ear ache, nasal congestion, post nasal drip,  Cardio-vascular:  No chest pain, Orthopnea, PND, swelling in lower extremities, anasarca, dizziness, palpitations  GI:  No heartburn, indigestion, abdominal pain, nausea, vomiting, diarrhea, change in bowel habits, loss of appetite  Resp:  No shortness of breath with exertion or at rest. No excess mucus, no productive cough, No non-productive cough, No coughing up of blood.No change in color of mucus.No wheezing.No chest wall deformity  Skin:  no rash or lesions.  GU:  no dysuria, change in color of urine, no urgency or frequency. No flank pain.  Musculoskeletal:  No joint pain or swelling. No decreased range of motion. No back pain.  Psych:  No change in mood or affect. No depression or anxiety. No memory loss.   Past Medical History  Diagnosis Date  . Hypertension   . Thyroid disease   . GERD (gastroesophageal reflux disease)   . Cancer   . Dyslipidemia   . Aortic insufficiency   . Mitral insufficiency   . History of nuclear stress test 02/26/2006    exercise myoview; normal pattern of perfusion; low risk scan    Past Surgical History  Procedure  Laterality Date  . Thyroidectomy  1973  . Joint replacement  2003  . Dilation and curettage of uterus    . Carpal tunnel release  1988  . Rotator cuff repair  2006  . H/o met test w/pft  04/02/2012    low risk; peak VO2 77%  predicted  . Cardiac catheterization  07/08/2008    normal coronaries  . Transthoracic echocardiogram  09/25/2012    EF 55-60%; mild LVH & mild concentric hypertrophy; mild AV regurg; RV systolic pressure increase consistent with mild pulm HTN   Social History:  reports that she quit smoking about 46 years ago. Her smoking use included Cigarettes. She has a 3 pack-year smoking history. She has never used smokeless tobacco. She reports that she drinks alcohol. She reports that she does not use illicit drugs.  Allergies  Allergen Reactions  . Milk-Related Compounds Other (See Comments)    REACTION: GI upset, projectile vomiting    Family History  Problem Relation Age of Onset  . Lung cancer Mother   . Heart attack Father   . Heart failure Maternal Grandfather   . Heart attack Paternal Grandfather      Prior to Admission medications   Medication Sig Start Date End Date Taking? Authorizing Provider  Acetaminophen (TYLENOL PO) Take 1-2 tablets by mouth daily as needed (for pain).   Yes Historical Provider, MD  albuterol (PROVENTIL HFA;VENTOLIN HFA) 108 (90 BASE) MCG/ACT inhaler Inhale 1 puff into the lungs every 6 (six) hours as needed for wheezing or shortness of breath.   Yes Historical Provider, MD  alendronate (FOSAMAX) 35 MG tablet Take 35 mg by mouth every 7 (seven) days. Takes on Thursdays.  Take with a full glass of water on an empty stomach.   Yes Historical Provider, MD  amLODipine (NORVASC) 10 MG tablet Take 5 mg by mouth daily.    Yes Historical Provider, MD  budesonide-formoterol (SYMBICORT) 160-4.5 MCG/ACT inhaler Inhale 2 puffs into the lungs 2 (two) times daily.   Yes Historical Provider, MD  irbesartan-hydrochlorothiazide (AVALIDE) 300-12.5 MG per tablet Take 1 tablet by mouth daily.   Yes Historical Provider, MD  levothyroxine (SYNTHROID, LEVOTHROID) 125 MCG tablet Take 125 mcg by mouth daily before breakfast.   Yes Historical Provider, MD  metoprolol succinate (TOPROL-XL)  25 MG 24 hr tablet Take 12.5 mg by mouth daily.   Yes Historical Provider, MD  montelukast (SINGULAIR) 10 MG tablet Take 10 mg by mouth daily. 04/15/13  Yes Historical Provider, MD  Multiple Vitamins-Minerals (MULTIVITAMIN WITH MINERALS) tablet Take 1 tablet by mouth daily.   Yes Historical Provider, MD  pantoprazole (PROTONIX) 40 MG tablet Take 40 mg by mouth 2 (two) times daily.    Yes Historical Provider, MD  ranitidine (ZANTAC) 300 MG tablet Take 300 mg by mouth at bedtime.   Yes Historical Provider, MD  EPINEPHrine (EPIPEN IJ) Inject 1 each as directed once as needed (for reaction to milk products).    Historical Provider, MD   Physical Exam: Filed Vitals:   09/19/13 1819  BP: 188/89  Pulse: 76  Temp: 98.4 F (36.9 C)  Resp: 18    BP 188/89  Pulse 76  Temp(Src) 98.4 F (36.9 C) (Oral)  Resp 18  Ht 5' (1.524 m)  Wt 75 kg (165 lb 5.5 oz)  BMI 32.29 kg/m2  SpO2 97%  General:  Appears calm and comfortable Eyes: PERRL, normal lids, irises & conjunctiva ENT: grossly normal hearing, lips & tongue Neck: no LAD, masses or  thyromegaly Cardiovascular: RRR, no m/r/g. No LE edema. Telemetry: SR, no arrhythmias  Respiratory: CTA bilaterally, no w/r/r. Normal respiratory effort. Abdomen: soft, ntnd Skin: no rash or induration seen on limited exam Musculoskeletal: grossly normal tone BUE/BLE Psychiatric: grossly normal mood and affect, speech fluent and appropriate Neurologic: Alert and oriented x3. Cranial nerves II through XII are grossly intact. No focal deficits.           Labs on Admission:  Basic Metabolic Panel:  Recent Labs Lab 09/19/13 1058  NA 136*  K 4.4  CL 97  CO2 25  GLUCOSE 96  BUN 25*  CREATININE 0.90  CALCIUM 9.9   Liver Function Tests:  Recent Labs Lab 09/19/13 1058  AST 22  ALT 23  ALKPHOS 119*  BILITOT 0.4  PROT 7.2  ALBUMIN 3.8   No results found for this basename: LIPASE, AMYLASE,  in the last 168 hours No results found for this  basename: AMMONIA,  in the last 168 hours CBC:  Recent Labs Lab 09/19/13 1058  WBC 13.5*  HGB 12.1  HCT 34.8*  MCV 88.5  PLT 468*   Cardiac Enzymes: No results found for this basename: CKTOTAL, CKMB, CKMBINDEX, TROPONINI,  in the last 168 hours  BNP (last 3 results) No results found for this basename: PROBNP,  in the last 8760 hours CBG: No results found for this basename: GLUCAP,  in the last 168 hours  Radiological Exams on Admission: Dg Chest 2 View  09/19/2013   CLINICAL DATA:  Hemoptysis.  Recent pneumonia  EXAM: CHEST  2 VIEW  COMPARISON:  None.  FINDINGS: Right middle lobe consolidation with volume loss and opacity. Left lung is clear. No effusion. No heart failure.  IMPRESSION: Right middle lobe consolidation. This could represent pneumonia. Postobstructive collapse is possible. Followup until clearing is suggested.   Electronically Signed   By: Franchot Gallo M.D.   On: 09/19/2013 12:13   Ct Chest Wo Contrast  09/19/2013   CLINICAL DATA:  73 year old female recently finished treatment for pneumonia. Lobe today with hemoptysis. Initial encounter.  EXAM: CT CHEST WITHOUT CONTRAST  TECHNIQUE: Multidetector CT imaging of the chest was performed following the standard protocol without IV contrast.  COMPARISON:  Chest radiographs from earlier today. [facility] medical Chest radiographs 09/03/2013. [facility] chest CTA 05/31/2011.  FINDINGS: Radiographs demonstrate recent confluent right middle lobe versus inferior upper lobe consolidation.  The trachea and proximal airways are patent. There is abrupt termination of gas within the right middle lobe bronchus just after it emerges from the bronchus intermedius (series 3, image 28). There is then complete right middle lobe collapse, or consolidation but no additional air bronchograms.  No pericardial or pleural effusion. No contrast administered. No definite hilar or mediastinal lymphadenopathy.  No nodularity elsewhere  in the right lung. Other Major airways are patent. Left lung is stable with multiple left lower lobe lung nodules, several densely calcified (series 3, image 40, 43) compatible with chronic granulomas.  Chronic postoperative changes to the left thyroid. Chronic mid thoracic disc and endplate degeneration. No acute or suspicious osseous lesion identified.  Negative visualized non contrast liver, gallbladder, spleen, pancreas, adrenal glands, kidneys common bowel.  IMPRESSION: 1. Complete drowning or collapse of the right middle lobe with abrupt termination of the right middle lobe bronchus just beyond its origin (series 3, image 28). Constellation of clinical and imaging data highly suspicious for an obstructing bronchial lesion. Recommend bronchoscopy. 2. Otherwise negative right lung. No definite hilar  or mediastinal lymphadenopathy. 3. Otherwise stable appearance of the chest since 2012, including right lower lobe granulomas.   Electronically Signed   By: Lars Pinks M.D.   On: 09/19/2013 15:19    EKG: Independently reviewed. None  Assessment/Plan Principal Problem:   Hemoptysis Active Problems:   HYPERLIPIDEMIA   HYPERTENSION   ALLERGIC RHINITIS   ASTHMA   Collapse of right lung: RML   Bronchial obstruction   Leukocytosis   Obstructive pneumonia  #1 hemoptysis Questionable etiology. Worrisome for postobstructive bronchial lesion in a patient with recurrent respiratory symptoms over the past few months and chest x-ray concerning for postobstructive collapse in the right middle lobe consolidation. CT of the chest showing complete drowning or collapse of the right middle lobe with abrupt termination of the right middle lobe bronchus just beyond its origin. Patient's hemoptysis has currently subsided. Follow H&H. We'll place empirically on IV Levaquin. We'll consult with pulmonary for further evaluation and management and probable bronchoscopy. IV fluids. Supportive care.  #2 probable  postobstructive pneumonia Chest x-ray worrisome for right middle lobe consultation. CT of the chest shows complete drowning or collapse of the right middle lobe with abrupt termination of the right middle lobe bronchus just beyond its origin. Patient noted to have a leukocytosis. Check a sputum Gram stain and culture. Check a urine Legionella antigen. Check a urine pneumococcus antigen. Placed empirically on IV Levaquin. We'll consult with pulmonary.  #3 hypertension Continue home regimen of Toprol, Norvasc, Avalide.  #4 probable right bronchial obstruction lesion/collapse of right middle lobe Worrisome for neoplastic process. See problem #1. Pulmonary consult for further evaluation and management. Patient will likely require probable bronchoscopy. Follow.  #5 hyperlipidemia Continue Lipitor.  #6 hypothyroidism Check a TSH. Continue Synthroid.  #7 leukocytosis Likely secondary to problem #2. Check a UA with cultures and sensitivities. Check a sputum Gram stain and culture. Check a urine Legionella antigen. Check a urine pneumococcus antigen. Placed empirically on IV Levaquin. Follow.  #8 history of aortic insufficiency/mitral insufficiency Stable. Continue cardiac medications of Avalide, Toprol XL, Lipitor.  #9 gastroesophageal reflux disease Continue PPI and H2 blocker.  #10 asthma Stable. Continue Symbicort. Albuterol as needed.  #11 prophylaxis PPI for GI prophylaxis. SCDs for DVT prophylaxis.  Code Status: Full Family Communication: Updated patient and her friend at bedside. Disposition Plan: Admit to telemetry.  Time spent: 15 mins  [facility] Pager [phone]

## 2013-09-19 NOTE — ED Notes (Signed)
Introduced myself to the patient, family is at the bedside.

## 2013-09-19 NOTE — ED Notes (Signed)
Patient transported to CT 

## 2013-09-20 DIAGNOSIS — J45909 Unspecified asthma, uncomplicated: Secondary | ICD-10-CM

## 2013-09-20 DIAGNOSIS — J398 Other specified diseases of upper respiratory tract: Secondary | ICD-10-CM

## 2013-09-20 DIAGNOSIS — R042 Hemoptysis: Secondary | ICD-10-CM

## 2013-09-20 DIAGNOSIS — J988 Other specified respiratory disorders: Secondary | ICD-10-CM

## 2013-09-20 DIAGNOSIS — J189 Pneumonia, unspecified organism: Principal | ICD-10-CM

## 2013-09-20 LAB — LEGIONELLA ANTIGEN, URINE: Legionella Antigen, Urine: NEGATIVE

## 2013-09-20 LAB — COMPREHENSIVE METABOLIC PANEL
ALT: 18 U/L (ref 0–35)
AST: 18 U/L (ref 0–37)
Albumin: 3.1 g/dL — ABNORMAL LOW (ref 3.5–5.2)
Alkaline Phosphatase: 97 U/L (ref 39–117)
BUN: 18 mg/dL (ref 6–23)
CALCIUM: 9.1 mg/dL (ref 8.4–10.5)
CO2: 25 meq/L (ref 19–32)
Chloride: 101 mEq/L (ref 96–112)
Creatinine, Ser: 1 mg/dL (ref 0.50–1.10)
GFR calc Af Amer: 65 mL/min — ABNORMAL LOW (ref >90)
GFR calc non Af Amer: 56 mL/min — ABNORMAL LOW (ref >90)
Glucose, Bld: 89 mg/dL (ref 70–99)
POTASSIUM: 4.4 meq/L (ref 3.7–5.3)
SODIUM: 139 meq/L (ref 137–147)
Total Bilirubin: 0.6 mg/dL (ref 0.3–1.2)
Total Protein: 6.1 g/dL (ref 6.0–8.3)

## 2013-09-20 LAB — CBC
HCT: 30.1 % — ABNORMAL LOW (ref 36.0–46.0)
HEMOGLOBIN: 10.5 g/dL — AB (ref 12.0–15.0)
MCH: 31.2 pg (ref 26.0–34.0)
MCHC: 34.9 g/dL (ref 30.0–36.0)
MCV: 89.3 fL (ref 78.0–100.0)
Platelets: 409 10*3/uL — ABNORMAL HIGH (ref 150–400)
RBC: 3.37 MIL/uL — ABNORMAL LOW (ref 3.87–5.11)
RDW: 13.2 % (ref 11.5–15.5)
WBC: 11.1 10*3/uL — ABNORMAL HIGH (ref 4.0–10.5)

## 2013-09-20 LAB — PROTIME-INR
INR: 0.99 (ref 0.00–1.49)
Prothrombin Time: 12.9 seconds (ref 11.6–15.2)

## 2013-09-20 LAB — APTT: aPTT: 28 seconds (ref 24–37)

## 2013-09-20 MED ORDER — GUAIFENESIN ER 600 MG PO TB12: 600.0000 mg | ORAL_TABLET | Freq: Two times a day (BID) | ORAL | Status: DC

## 2013-09-20 MED ORDER — LEVOFLOXACIN 750 MG PO TABS: 750.0000 mg | ORAL_TABLET | Freq: Every day | ORAL | Status: DC

## 2013-09-20 MED ORDER — HYDROCOD POLST-CHLORPHEN POLST 10-8 MG/5ML PO LQCR: 5.0000 mL | Freq: Two times a day (BID) | ORAL | Status: DC

## 2013-09-20 NOTE — Discharge Summary (Signed)
PATIENT DETAILS Name: Kimberly [name] Age: 73 y.o. Sex: female Date of Birth: 26-Aug-1941 MRN: [id number]. Admit Date: 09/19/2013 Admitting Physician: Eugenie Filler, MD FYB:OFBPZ,WCHENI DAVIDSON, MD  Recommendations for Outpatient Follow-up:  1. Patient will need to followup with Dr. Melvyn Novas- for bronchoscopy with biopsy 2. Please follow urine culture -pending at the time of discharge 3. Please follow urine Legionella antigen-pending at the time of discharge  PRIMARY DISCHARGE DIAGNOSIS:  Principal Problem:   Hemoptysis Active Problems:   HYPERLIPIDEMIA   HYPERTENSION   ALLERGIC RHINITIS   ASTHMA   Collapse of right lung: RML   Bronchial obstruction   Leukocytosis   Obstructive pneumonia      PAST MEDICAL HISTORY: Past Medical History  Diagnosis Date  . Hypertension   . Thyroid disease   . GERD (gastroesophageal reflux disease)   . Cancer   . Dyslipidemia   . Aortic insufficiency   . Mitral insufficiency   . History of nuclear stress test 02/26/2006    exercise myoview; normal pattern of perfusion; low risk scan     DISCHARGE MEDICATIONS:   Medication List    STOP taking these medications       alendronate 35 MG tablet  Commonly known as:  FOSAMAX      TAKE these medications       albuterol 108 (90 BASE) MCG/ACT inhaler  Commonly known as:  PROVENTIL HFA;VENTOLIN HFA  Inhale 1 puff into the lungs every 6 (six) hours as needed for wheezing or shortness of breath.     amLODipine 10 MG tablet  Commonly known as:  NORVASC  Take 5 mg by mouth daily.     budesonide-formoterol 160-4.5 MCG/ACT inhaler  Commonly known as:  SYMBICORT  Inhale 2 puffs into the lungs 2 (two) times daily.     chlorpheniramine-HYDROcodone 10-8 MG/5ML Lqcr  Commonly known as:  TUSSIONEX PENNKINETIC ER  Take 5 mLs by mouth 2 (two) times daily.     EPIPEN IJ  Inject 1 each as directed once as needed (for reaction to milk products).     guaiFENesin 600 MG 12 hr tablet  Commonly  known as:  MUCINEX  Take 1 tablet (600 mg total) by mouth 2 (two) times daily.     irbesartan-hydrochlorothiazide 300-12.5 MG per tablet  Commonly known as:  AVALIDE  Take 1 tablet by mouth daily.     levofloxacin 750 MG tablet  Commonly known as:  LEVAQUIN  Take 1 tablet (750 mg total) by mouth daily.     levothyroxine 125 MCG tablet  Commonly known as:  SYNTHROID, LEVOTHROID  Take 125 mcg by mouth daily before breakfast.     metoprolol succinate 25 MG 24 hr tablet  Commonly known as:  TOPROL-XL  Take 12.5 mg by mouth daily.     montelukast 10 MG tablet  Commonly known as:  SINGULAIR  Take 10 mg by mouth daily.     multivitamin with minerals tablet  Take 1 tablet by mouth daily.     pantoprazole 40 MG tablet  Commonly known as:  PROTONIX  Take 40 mg by mouth 2 (two) times daily.     ranitidine 300 MG tablet  Commonly known as:  ZANTAC  Take 300 mg by mouth at bedtime.     TYLENOL PO  Take 1-2 tablets by mouth daily as needed (for pain).        ALLERGIES:   Allergies  Allergen Reactions  . Milk-Related Compounds Other (See Comments)  REACTION: GI upset, projectile vomiting    BRIEF HPI:  See H&P, Labs, Consult and Test reports for all details in brief, patient was admitted for cough and hemoptysis.  CONSULTATIONS:   pulmonary/intensive care  PERTINENT RADIOLOGIC STUDIES: Dg Chest 2 View  09/19/2013   CLINICAL DATA:  Hemoptysis.  Recent pneumonia  EXAM: CHEST  2 VIEW  COMPARISON:  None.  FINDINGS: Right middle lobe consolidation with volume loss and opacity. Left lung is clear. No effusion. No heart failure.  IMPRESSION: Right middle lobe consolidation. This could represent pneumonia. Postobstructive collapse is possible. Followup until clearing is suggested.   Electronically Signed   By: Franchot Gallo M.D.   On: 09/19/2013 12:13   Ct Chest Wo Contrast  09/19/2013   CLINICAL DATA:  73 year old female recently finished treatment for pneumonia. Lobe today  with hemoptysis. Initial encounter.  EXAM: CT CHEST WITHOUT CONTRAST  TECHNIQUE: Multidetector CT imaging of the chest was performed following the standard protocol without IV contrast.  COMPARISON:  Chest radiographs from earlier today. [facility] medical Chest radiographs 09/03/2013. [facility] chest CTA 05/31/2011.  FINDINGS: Radiographs demonstrate recent confluent right middle lobe versus inferior upper lobe consolidation.  The trachea and proximal airways are patent. There is abrupt termination of gas within the right middle lobe bronchus just after it emerges from the bronchus intermedius (series 3, image 28). There is then complete right middle lobe collapse, or consolidation but no additional air bronchograms.  No pericardial or pleural effusion. No contrast administered. No definite hilar or mediastinal lymphadenopathy.  No nodularity elsewhere in the right lung. Other Major airways are patent. Left lung is stable with multiple left lower lobe lung nodules, several densely calcified (series 3, image 40, 43) compatible with chronic granulomas.  Chronic postoperative changes to the left thyroid. Chronic mid thoracic disc and endplate degeneration. No acute or suspicious osseous lesion identified.  Negative visualized non contrast liver, gallbladder, spleen, pancreas, adrenal glands, kidneys common bowel.  IMPRESSION: 1. Complete drowning or collapse of the right middle lobe with abrupt termination of the right middle lobe bronchus just beyond its origin (series 3, image 28). Constellation of clinical and imaging data highly suspicious for an obstructing bronchial lesion. Recommend bronchoscopy. 2. Otherwise negative right lung. No definite hilar or mediastinal lymphadenopathy. 3. Otherwise stable appearance of the chest since 2012, including right lower lobe granulomas.   Electronically Signed   By: Lars Pinks M.D.   On: 09/19/2013 15:19     PERTINENT LAB RESULTS: CBC:  Recent  Labs  09/19/13 1058 09/19/13 1944 09/20/13 0500  WBC 13.5*  --  11.1*  HGB 12.1 11.6* 10.5*  HCT 34.8* 34.0* 30.1*  PLT 468*  --  409*   CMET CMP     Component Value Date/Time   NA 139 09/20/2013 0500   K 4.4 09/20/2013 0500   CL 101 09/20/2013 0500   CO2 25 09/20/2013 0500   GLUCOSE 89 09/20/2013 0500   BUN 18 09/20/2013 0500   CREATININE 1.00 09/20/2013 0500   CALCIUM 9.1 09/20/2013 0500   PROT 6.1 09/20/2013 0500   ALBUMIN 3.1* 09/20/2013 0500   AST 18 09/20/2013 0500   ALT 18 09/20/2013 0500   ALKPHOS 97 09/20/2013 0500   BILITOT 0.6 09/20/2013 0500   GFRNONAA 56* 09/20/2013 0500   GFRAA 65* 09/20/2013 0500    GFR Estimated Creatinine Clearance: 48.5 ml/min (by C-G formula based on Cr of 1). No results found for this basename: LIPASE, AMYLASE,  in the last 72 hours No results found for this basename: CKTOTAL, CKMB, CKMBINDEX, TROPONINI,  in the last 72 hours No components found with this basename: POCBNP,  No results found for this basename: DDIMER,  in the last 72 hours No results found for this basename: HGBA1C,  in the last 72 hours No results found for this basename: CHOL, HDL, LDLCALC, TRIG, CHOLHDL, LDLDIRECT,  in the last 72 hours No results found for this basename: TSH, T4TOTAL, FREET3, T3FREE, THYROIDAB,  in the last 72 hours No results found for this basename: VITAMINB12, FOLATE, FERRITIN, TIBC, IRON, RETICCTPCT,  in the last 72 hours Coags:  Recent Labs  09/19/13 1944 09/20/13 0500  INR 0.92 0.99   Microbiology: No results found for this or any previous visit (from the past 240 hour(s)).   BRIEF HOSPITAL COURSE:   Principal Problem:   Hemoptysis - Patient was admitted, a CT scan of the chest showed a possible endobronchial lesion in the right bronchus. Patient was placed on empiric antibiotics and other supportive care. Since admission she has had no further hemoptysis. She was evaluated by pulmonary critical care medicine-Dr. Melvyn Novas, who advised to continue with  Levaquin. Since hemoptysis has resolved, the patient is doing well without any major complaints, Dr. Melvyn Novas has advised discharge, he will followup the patient next week for a outpatient bronchoscopy and biopsy. She will be discharged on Levaquin for 5 days. Patient will also be placed on Tussionex and Mucinex. - Long discussion with patient and daughter, they're agreeable with the above-noted plan.  Postobstructive pneumonia with right middle lobe collapse - Secondary to a underlying endobronchial lesion. - Currently afebrile, nontoxic looking without any leukocytosis. - Current plans are for outpatient bronchoscopy with biopsy. - Will be continued on Levaquin on discharge. - Please see above for further details  Possible lung cancer - CT scan chest shows a right-sided endobronchial lesion- causing above - Pulmonary critical care medicine consulted, current recommendations are for discharge with plans for bronchoscopy with biopsy to be done as an outpatient.  History of chronic cough - Continue PPI, H2 blockers and inhalers discharge  Hypertension  -Continue home regimen of Toprol, Norvasc, Avalide.  Hyperlipidemia  -Continue Lipitor.   Hypothyroidism  -Continue Synthroid.   Rest of her medical issues were stable during this short admission  TODAY-DAY OF DISCHARGE:  Subjective:   Kimberly [name] today has no headache,no chest abdominal pain,no new weakness tingling or numbness, feels much better wants to go home today. She has had no further hemoptysis.  Objective:   Blood pressure 142/54, pulse 63, temperature 97.9 F (36.6 C), temperature source Oral, resp. rate 18, height 5' (1.524 m), weight 78.518 kg (173 lb 1.6 oz), SpO2 97.00%.  Intake/Output Summary (Last 24 hours) at 09/20/13 1003 Last data filed at 09/20/13 0538  Gross per 24 hour  Intake    240 ml  Output   1100 ml  Net   -860 ml   Filed Weights   09/19/13 1050 09/19/13 1819 09/20/13 0623  Weight: 82.611 kg  (182 lb 2 oz) 75 kg (165 lb 5.5 oz) 78.518 kg (173 lb 1.6 oz)    Exam Awake Alert, Oriented *3, No new F.N deficits, Normal affect [place].AT,PERRAL Supple Neck,No JVD, No cervical lymphadenopathy appriciated.  Symmetrical Chest wall movement, Good air movement bilaterally, CTAB RRR,No Gallops,Rubs or new Murmurs, No Parasternal Heave +ve B.Sounds, Abd Soft, Non tender, No organomegaly appriciated, No rebound -guarding or rigidity. No Cyanosis, Clubbing or edema, No new Rash  or bruise  DISCHARGE CONDITION: Stable  DISPOSITION: Home  DISCHARGE INSTRUCTIONS:    Activity:  As tolerated   Diet recommendation: Heart Healthy diet      Discharge Orders   Future Orders Complete By Expires   Call MD for:  difficulty breathing, headache or visual disturbances  As directed    Call MD for:  temperature >100.4  As directed    Diet - low sodium heart healthy  As directed    Increase activity slowly  As directed       Follow-up Information   Follow up with [name], MD. Schedule an appointment as soon as possible for a visit in 1 week.   Specialty:  Internal Medicine   Contact information:   [place]  [address] [phone]       Follow up with Christinia Gully, MD. (Please call office on 1/19 to make an appointment ASAP)    Specialty:  Pulmonary Disease   Contact information:   [address] [address] [phone]      Total Time spent on discharge equals 45 minutes.  SignedOren Binet 09/20/2013 10:03 AM

## 2013-09-20 NOTE — Progress Notes (Signed)
09/20/13 Patient to be discharged home today. IV site removed and discharge instructions reviewed with patient.

## 2013-09-20 NOTE — Consult Note (Signed)
Name: Kimberly [name] MRN: [id number] DOB: 12-28-1940    ADMISSION DATE:  09/19/2013 CONSULTATION DATE:  09/20/13  REFERRING MD :  [facility] Dr Nigel Bridgeman PRIMARY SERVICE: [facility]  CHIEF COMPLAINT:  hemoptysis  BRIEF PATIENT DESCRIPTION: 73 yowf quit smoking 73 y pta with cough x 3 years "like a tickle" then acutely worse since late Nov 2014 with dx of bronchitis then pna rx zpak and avielox but persistent cough > basline, mostly dry until 1 day pta with hemoptysis x 4 hours x one half cup to ER with dx of RML pna and pulm asked to see am 1/17    SIGNIFICANT EVENTS / STUDIES:  CT Chest without contrast 1/16 >>  1) Complete drowning or collapse of the right middle lobe with  abrupt termination of the right middle lobe bronchus just beyond its  origin (series 3, image 28). Constellation of clinical and imaging  data highly suspicious for an obstructing bronchial lesion.  Recommend bronchoscopy.  2. Otherwise negative right lung. No definite hilar or mediastinal  lymphadenopathy.       HISTORY OF PRESENT ILLNESS:   See above. Pt eval by Ramaswamy in 2010 with cough and sob with Pos MCT/ neg w/u for ABPA/ Kozlow eval for allergies but continued with daily cough x 3 years mostly daytime, mostly dry, like a tickle in the throat until abupt onset of different pattern cough prod of yellow mucus and assoc fever late Nov rx zpak then avelox which helped the yellow mucus and fever but persistent worse than baseline daily cough s sob until onset of hemoptsys 09/19/13, now resolved- no epistaxis, no asa or nsaid exp  No obvious day to day or daytime variabilty or assoc  cp or chest tightness, subjective wheeze overt sinus or hb symptoms. No unusual exp hx or h/o childhood pna/ asthma or knowledge of premature birth.  Sleeping ok without nocturnal  or early am exacerbation  of respiratory  c/o's or need for noct saba. Also denies any obvious fluctuation of symptoms with weather or environmental changes  or other aggravating or alleviating factors except as outlined above   Current Medications, Allergies, Complete Past Medical History, Past Surgical History, Family History, and Social History were reviewed in [employer] record.  ROS  The following are not active complaints unless bolded sore throat, dysphagia, dental problems, itching, sneezing,  nasal congestion or excess/ purulent secretions, ear ache,   fever, chills, sweats, unintended wt loss, pleuritic or exertional cp, hemoptysis,  orthopnea pnd or leg swelling, presyncope, palpitations, heartburn, abdominal pain, anorexia, nausea, vomiting, diarrhea  or change in bowel or urinary habits, change in stools or urine, dysuria,hematuria,  rash, arthralgias, visual complaints, headache, numbness weakness or ataxia or problems with walking or coordination,  change in mood/affect or memory.       PAST MEDICAL HISTORY :  Past Medical History  Diagnosis Date  . Hypertension   . Thyroid disease   . GERD (gastroesophageal reflux disease)   . Cancer   . Dyslipidemia   . Aortic insufficiency   . Mitral insufficiency   . History of nuclear stress test 02/26/2006    exercise myoview; normal pattern of perfusion; low risk scan    Past Surgical History  Procedure Laterality Date  . Thyroidectomy  1973  . Joint replacement  2003  . Dilation and curettage of uterus    . Carpal tunnel release  1988  . Rotator cuff repair  2006  . H/o met test  w/pft  04/02/2012    low risk; peak VO2 77% predicted  . Cardiac catheterization  07/08/2008    normal coronaries  . Transthoracic echocardiogram  09/25/2012    EF 55-60%; mild LVH & mild concentric hypertrophy; mild AV regurg; RV systolic pressure increase consistent with mild pulm HTN   Prior to Admission medications   Medication Sig Start Date End Date Taking? Authorizing Provider  Acetaminophen (TYLENOL PO) Take 1-2 tablets by mouth daily as needed (for pain).   Yes Historical  Provider, MD  albuterol (PROVENTIL HFA;VENTOLIN HFA) 108 (90 BASE) MCG/ACT inhaler Inhale 1 puff into the lungs every 6 (six) hours as needed for wheezing or shortness of breath.   Yes Historical Provider, MD  alendronate (FOSAMAX) 35 MG tablet Take 35 mg by mouth every 7 (seven) days. Takes on Thursdays.  Take with a full glass of water on an empty stomach.   Yes Historical Provider, MD  amLODipine (NORVASC) 10 MG tablet Take 5 mg by mouth daily.    Yes Historical Provider, MD  budesonide-formoterol (SYMBICORT) 160-4.5 MCG/ACT inhaler Inhale 2 puffs into the lungs 2 (two) times daily.   Yes Historical Provider, MD  irbesartan-hydrochlorothiazide (AVALIDE) 300-12.5 MG per tablet Take 1 tablet by mouth daily.   Yes Historical Provider, MD  levothyroxine (SYNTHROID, LEVOTHROID) 125 MCG tablet Take 125 mcg by mouth daily before breakfast.   Yes Historical Provider, MD  metoprolol succinate (TOPROL-XL) 25 MG 24 hr tablet Take 12.5 mg by mouth daily.   Yes Historical Provider, MD  montelukast (SINGULAIR) 10 MG tablet Take 10 mg by mouth daily. 04/15/13  Yes Historical Provider, MD  Multiple Vitamins-Minerals (MULTIVITAMIN WITH MINERALS) tablet Take 1 tablet by mouth daily.   Yes Historical Provider, MD  pantoprazole (PROTONIX) 40 MG tablet Take 40 mg by mouth 2 (two) times daily.    Yes Historical Provider, MD  ranitidine (ZANTAC) 300 MG tablet Take 300 mg by mouth at bedtime.   Yes Historical Provider, MD  EPINEPHrine (EPIPEN IJ) Inject 1 each as directed once as needed (for reaction to milk products).    Historical Provider, MD   Allergies  Allergen Reactions  . Milk-Related Compounds Other (See Comments)    REACTION: GI upset, projectile vomiting    FAMILY HISTORY:  Family History  Problem Relation Age of Onset  . Lung cancer Mother   . Heart attack Father   . Heart failure Maternal Grandfather   . Heart attack Paternal Grandfather    SOCIAL HISTORY:  reports that she quit smoking about 46  years ago. Her smoking use included Cigarettes. She has a 3 pack-year smoking history. She has never used smokeless tobacco. She reports that she drinks alcohol. She reports that she does not use illicit drugs.     SUBJECTIVE:  Comfortable at rest, no hemoptysis x 24 h  VITAL SIGNS: Temp:  [97.9 F (36.6 C)-98.4 F (36.9 C)] 97.9 F (36.6 C) (01/17 0623) Pulse Rate:  [63-76] 63 (01/17 0623) Resp:  [18] 18 (01/17 0623) BP: (131-188)/(54-89) 142/54 mmHg (01/17 0623) SpO2:  [94 %-97 %] 97 % (01/17 0938) Weight:  [165 lb 5.5 oz (75 kg)-182 lb 2 oz (82.611 kg)] 173 lb 1.6 oz (78.518 kg) (01/17 0623) FIO2  RA      INTAKE / OUTPUT: Intake/Output     01/16 0701 - 01/17 0700 01/17 0701 - 01/18 0700   P.O. 240    Total Intake(mL/kg) 240 (3.1)    Urine (mL/kg/hr) 1100    Total  Output 1100     Net -860            PHYSICAL EXAMINATION: General:   Pleasant wf nad HEENT: nl dentition, turbinates, and orophanx. Nl external ear canals without cough reflex   NECK :  without JVD/Nodes/TM/ nl carotid upstrokes bilaterally   LUNGS: no acc muscle use, clear to A and P bilaterally without cough on insp or exp maneuvers   CV:  RRR  no s3 or murmur or increase in P2, no edema   ABD:  soft and nontender with nl excursion in the supine position. No bruits or organomegaly, bowel sounds nl  MS:  warm without deformities, calf tenderness, cyanosis or clubbing  SKIN: warm and dry without lesions    NEURO:  alert, approp, no deficits    LABS:  CBC  Recent Labs Lab 09/19/13 1058 09/19/13 1944 09/20/13 0500  WBC 13.5*  --  11.1*  HGB 12.1 11.6* 10.5*  HCT 34.8* 34.0* 30.1*  PLT 468*  --  409*   Coag's  Recent Labs Lab 09/19/13 1944 09/20/13 0500  APTT 28 28  INR 0.92 0.99   BMET  Recent Labs Lab 09/19/13 1058 09/20/13 0500  NA 136* 139  K 4.4 4.4  CL 97 101  CO2 25 25  BUN 25* 18  CREATININE 0.90 1.00  GLUCOSE 96 89   Electrolytes  Recent Labs Lab  09/19/13 1058 09/19/13 1944 09/20/13 0500  CALCIUM 9.9  --  9.1  MG  --  1.6  --    Sepsis Markers No results found for this basename: LATICACIDVEN, PROCALCITON, O2SATVEN,  in the last 168 hours ABG No results found for this basename: PHART, PCO2ART, PO2ART,  in the last 168 hours Liver Enzymes  Recent Labs Lab 09/19/13 1058 09/20/13 0500  AST 22 18  ALT 23 18  ALKPHOS 119* 97  BILITOT 0.4 0.6  ALBUMIN 3.8 3.1*   Cardiac Enzymes No results found for this basename: TROPONINI, PROBNP,  in the last 168 hours Glucose No results found for this basename: GLUCAP,  in the last 168 hours  Imaging Dg Chest 2 View  09/19/2013   CLINICAL DATA:  Hemoptysis.  Recent pneumonia  EXAM: CHEST  2 VIEW  COMPARISON:  None.  FINDINGS: Right middle lobe consolidation with volume loss and opacity. Left lung is clear. No effusion. No heart failure.  IMPRESSION: Right middle lobe consolidation. This could represent pneumonia. Postobstructive collapse is possible. Followup until clearing is suggested.   Electronically Signed   By: Franchot Gallo M.D.   On: 09/19/2013 12:13   Ct Chest Wo Contrast  09/19/2013   CLINICAL DATA:  73 year old female recently finished treatment for pneumonia. Lobe today with hemoptysis. Initial encounter.  EXAM: CT CHEST WITHOUT CONTRAST  TECHNIQUE: Multidetector CT imaging of the chest was performed following the standard protocol without IV contrast.  COMPARISON:  Chest radiographs from earlier today. [facility] medical Chest radiographs 09/03/2013. [facility] chest CTA 05/31/2011.  FINDINGS: Radiographs demonstrate recent confluent right middle lobe versus inferior upper lobe consolidation.  The trachea and proximal airways are patent. There is abrupt termination of gas within the right middle lobe bronchus just after it emerges from the bronchus intermedius (series 3, image 28). There is then complete right middle lobe collapse, or consolidation but no  additional air bronchograms.  No pericardial or pleural effusion. No contrast administered. No definite hilar or mediastinal lymphadenopathy.  No nodularity elsewhere in the right lung. Other Major airways  are patent. Left lung is stable with multiple left lower lobe lung nodules, several densely calcified (series 3, image 40, 43) compatible with chronic granulomas.  Chronic postoperative changes to the left thyroid. Chronic mid thoracic disc and endplate degeneration. No acute or suspicious osseous lesion identified.  Negative visualized non contrast liver, gallbladder, spleen, pancreas, adrenal glands, kidneys common bowel.  IMPRESSION: 1. Complete drowning or collapse of the right middle lobe with abrupt termination of the right middle lobe bronchus just beyond its origin (series 3, image 28). Constellation of clinical and imaging data highly suspicious for an obstructing bronchial lesion. Recommend bronchoscopy. 2. Otherwise negative right lung. No definite hilar or mediastinal lymphadenopathy. 3. Otherwise stable appearance of the chest since 2012, including right lower lobe granulomas.   Electronically Signed   By: Lars Pinks M.D.   On: 09/19/2013 15:19       ASSESSMENT / PLAN:        1)  Acute hemoptysis in setting of "unresolved pna" RML / remote smoker with passive exp and Pos fm hx lung ca worrisome but could also be carcinoid - strongly suggestive of endobronchial obst RML orifice, only way to know what this is = FOB  rec Ok to discharge on levaquin/ cough suppression and bedrest and will call to schedule as outpt  Discussed in detail all the  indications, usual  risks and alternatives  relative to the benefits with patient who agrees to proceed with bronchoscopy with biopsy.     2)  Chronic cough likely unrelated to  #1 The most common causes of chronic cough in immunocompetent adults include the following: upper airway cough syndrome (UACS), previously referred to as postnasal drip  syndrome (PNDS), which is caused by variety of rhinosinus conditions; (2) asthma; (3) GERD; (4) chronic bronchitis from cigarette smoking or other inhaled environmental irritants; (5) nonasthmatic eosinophilic bronchitis; and (6) bronchiectasis.   These conditions, singly or in combination, have accounted for up to 94% of the causes of chronic cough in prospective studies.   Other conditions have constituted no >6% of the causes in prospective studies These have included bronchogenic carcinoma, chronic interstitial pneumonia, sarcoidosis, left ventricular failure, ACEI-induced cough, and aspiration from a condition associated with pharyngeal dysfunction.    Chronic cough is often simultaneously caused by more than one condition. A single cause has been found from 38 to 82% of the time, multiple causes from 18 to 62%. Multiply caused cough has been the result of three diseases up to 42% of the time.    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  rec max rx for GERD and D/C fosfamax for now   Christinia Gully, MD Pulmonary and [place] [phone] After 5:30 PM or weekends, call [phone]

## 2013-09-21 LAB — URINE CULTURE: Colony Count: 35000

## 2013-09-22 ENCOUNTER — Telehealth: Payer: Self-pay | Admitting: Internal Medicine

## 2013-09-22 ENCOUNTER — Encounter: Payer: Self-pay | Admitting: Internal Medicine

## 2013-09-22 NOTE — Telephone Encounter (Signed)
Per her discharge summary, she was to call our office to be set up with Dr. Melvyn Novas for a bronch.  MW - will pt need to been in the office for a consult or can we set up her bronch without an appointment?

## 2013-09-22 NOTE — Telephone Encounter (Signed)
Discussed with pt - she had not disclosed she was taking asa 81 mg daily including today's dose so rec delay until 09/25/13 to reduce the risk of bleeding  Discussed in detail all the  indications, usual  risks and alternatives  relative to the benefits with patient who agrees to proceed with bronchoscopy with biopsy

## 2013-09-22 NOTE — Progress Notes (Signed)
UR completed. Jonnie Finner RN CCM Case Management

## 2013-09-24 ENCOUNTER — Telehealth: Payer: Self-pay | Admitting: Internal Medicine

## 2013-09-24 ENCOUNTER — Encounter (HOSPITAL_COMMUNITY): Payer: Self-pay

## 2013-09-24 NOTE — Telephone Encounter (Signed)
Spoke with pt son-in-law and he and pt daugher has some questions about the bronch. They wanted to [facility] if MW would be performing, which I advised yes, and they wanted to know when to expect results. I advised usually takes a few days depending on what is sent. I advised to ask the day of as well what kind of timeline they were looking at for results. Pt daughter has HPOA and will provide copy at visit.  Nothing further needed. Mulino Bing, CMA

## 2013-09-25 ENCOUNTER — Ambulatory Visit (HOSPITAL_COMMUNITY)
Admission: RE | Admit: 2013-09-25 | Discharge: 2013-09-25 | Disposition: A | Discharge status: Home / Self Care | Payer: Medicare Other | Source: Ambulatory Visit | Attending: Internal Medicine | Admitting: Internal Medicine

## 2013-09-25 ENCOUNTER — Encounter (HOSPITAL_COMMUNITY)
Admission: RE | Disposition: A | Discharge status: Home / Self Care | Payer: Medicare Other | Source: Ambulatory Visit | Attending: Internal Medicine

## 2013-09-25 DIAGNOSIS — J9809 Other diseases of bronchus, not elsewhere classified: Secondary | ICD-10-CM

## 2013-09-25 DIAGNOSIS — Z801 Family history of malignant neoplasm of trachea, bronchus and lung: Secondary | ICD-10-CM | POA: Insufficient documentation

## 2013-09-25 DIAGNOSIS — J988 Other specified respiratory disorders: Secondary | ICD-10-CM

## 2013-09-25 DIAGNOSIS — J398 Other specified diseases of upper respiratory tract: Secondary | ICD-10-CM

## 2013-09-25 DIAGNOSIS — J42 Unspecified chronic bronchitis: Secondary | ICD-10-CM | POA: Insufficient documentation

## 2013-09-25 DIAGNOSIS — R05 Cough: Secondary | ICD-10-CM | POA: Insufficient documentation

## 2013-09-25 DIAGNOSIS — Z87891 Personal history of nicotine dependence: Secondary | ICD-10-CM | POA: Insufficient documentation

## 2013-09-25 DIAGNOSIS — E785 Hyperlipidemia, unspecified: Secondary | ICD-10-CM | POA: Insufficient documentation

## 2013-09-25 DIAGNOSIS — I10 Essential (primary) hypertension: Secondary | ICD-10-CM | POA: Insufficient documentation

## 2013-09-25 DIAGNOSIS — Z79899 Other long term (current) drug therapy: Secondary | ICD-10-CM | POA: Insufficient documentation

## 2013-09-25 DIAGNOSIS — R059 Cough, unspecified: Secondary | ICD-10-CM | POA: Insufficient documentation

## 2013-09-25 DIAGNOSIS — K219 Gastro-esophageal reflux disease without esophagitis: Secondary | ICD-10-CM | POA: Insufficient documentation

## 2013-09-25 DIAGNOSIS — R042 Hemoptysis: Secondary | ICD-10-CM | POA: Insufficient documentation

## 2013-09-25 HISTORY — PX: VIDEO BRONCHOSCOPY: SHX5072

## 2013-09-25 LAB — GLUCOSE, CAPILLARY: Glucose-Capillary: 150 mg/dL — ABNORMAL HIGH (ref 70–99)

## 2013-09-25 SURGERY — VIDEO BRONCHOSCOPY WITHOUT FLUORO
Anesthesia: Moderate Sedation | Laterality: Bilateral

## 2013-09-25 MED ORDER — PHENYLEPHRINE HCL 0.25 % NA SOLN
1.0000 | Freq: Four times a day (QID) | NASAL | Status: DC | PRN
  Filled 2013-09-25: qty 15

## 2013-09-25 MED ORDER — EPINEPHRINE HCL 0.1 MG/ML IJ SOSY
PREFILLED_SYRINGE | INTRAMUSCULAR | Status: DC | PRN
  Administered 2013-09-25: 1 via INTRAVENOUS

## 2013-09-25 MED ORDER — LIDOCAINE HCL 1 % IJ SOLN
INTRAMUSCULAR | Status: DC | PRN
  Administered 2013-09-25: 2 mL via RESPIRATORY_TRACT

## 2013-09-25 MED ORDER — MEPERIDINE HCL 100 MG/ML IJ SOLN: 100.0000 mg | Freq: Once | INTRAMUSCULAR | Status: DC

## 2013-09-25 MED ORDER — MEPERIDINE HCL 25 MG/ML IJ SOLN
INTRAMUSCULAR | Status: DC | PRN
  Administered 2013-09-25: 50 mg via INTRAVENOUS

## 2013-09-25 MED ORDER — LIDOCAINE HCL 2 % EX GEL
CUTANEOUS | Status: DC | PRN
  Administered 2013-09-25: 1

## 2013-09-25 MED ORDER — PHENYLEPHRINE HCL 0.25 % NA SOLN
NASAL | Status: DC | PRN
Start: 2013-09-25 — End: 2013-09-25
  Administered 2013-09-25: 2 via NASAL

## 2013-09-25 MED ORDER — MIDAZOLAM HCL 10 MG/2ML IJ SOLN
INTRAMUSCULAR | Status: DC | PRN
  Administered 2013-09-25: 2.5 mg via INTRAVENOUS

## 2013-09-25 MED ORDER — LIDOCAINE HCL 2 % EX GEL
Freq: Once | CUTANEOUS | Status: DC
  Filled 2013-09-25: qty 5

## 2013-09-25 MED ORDER — SODIUM CHLORIDE 0.9 % IV SOLN
INTRAVENOUS | Status: DC
  Administered 2013-09-25: 20 mL/h via INTRAVENOUS

## 2013-09-25 MED ORDER — MEPERIDINE HCL 100 MG/ML IJ SOLN
INTRAMUSCULAR | Status: AC
  Filled 2013-09-25: qty 2

## 2013-09-25 MED ORDER — MIDAZOLAM HCL 10 MG/2ML IJ SOLN: 1.0000 mg | Freq: Once | INTRAMUSCULAR | Status: DC

## 2013-09-25 MED ORDER — MIDAZOLAM HCL 10 MG/2ML IJ SOLN
INTRAMUSCULAR | Status: AC
  Filled 2013-09-25: qty 4

## 2013-09-25 NOTE — H&P (Signed)
ADMISSION DATE:  09/19/2013 CONSULTATION DATE:  09/20/13   REFERRING MD :  [facility] Dr Nigel Bridgeman PRIMARY SERVICE: [facility]   CHIEF COMPLAINT:  hemoptysis   BRIEF PATIENT DESCRIPTION: 73 yowf quit smoking 52 y pta with cough x 3 years "like a tickle" then acutely worse since late Nov 2014 with dx of bronchitis then pna rx zpak and avielox but persistent cough > basline, mostly dry until 1 day pta with hemoptysis x 4 hours x one half cup to ER with dx of RML pna and pulm asked to see am 1/17       SIGNIFICANT EVENTS / STUDIES:   CT Chest without contrast 1/16 >>   1) Complete drowning or collapse of the right middle lobe with   abrupt termination of the right middle lobe bronchus just beyond its   origin (series 3, image 28). Constellation of clinical and imaging   data highly suspicious for an obstructing bronchial lesion.   Recommend bronchoscopy.   2. Otherwise negative right lung. No definite hilar or mediastinal   lymphadenopathy.          HISTORY OF PRESENT ILLNESS:    See above. Pt eval by Ramaswamy in 2010 with cough and sob with Pos MCT/ neg w/u for ABPA/ Kozlow eval for allergies but continued with daily cough x 3 years mostly daytime, mostly dry, like a tickle in the throat until abupt onset of different pattern cough prod of yellow mucus and assoc fever late Nov rx zpak then avelox which helped the yellow mucus and fever but persistent worse than baseline daily cough s sob until onset of hemoptsys 09/19/13, now resolved- no epistaxis, no asa or nsaid exp   No obvious day to day or daytime variabilty or assoc  cp or chest tightness, subjective wheeze overt sinus or hb symptoms. No unusual exp hx or h/o childhood pna/ asthma or knowledge of premature birth.   Sleeping ok without nocturnal  or early am exacerbation  of respiratory  c/o's or need for noct saba. Also denies any obvious fluctuation of symptoms with weather or environmental changes or other aggravating or alleviating  factors except as outlined above    Current Medications, Allergies, Complete Past Medical History, Past Surgical History, Family History, and Social History were reviewed in [employer] record.   ROS  The following are not active complaints unless bolded sore throat, dysphagia, dental problems, itching, sneezing,  nasal congestion or excess/ purulent secretions, ear ache,   fever, chills, sweats, unintended wt loss, pleuritic or exertional cp, hemoptysis,  orthopnea pnd or leg swelling, presyncope, palpitations, heartburn, abdominal pain, anorexia, nausea, vomiting, diarrhea  or change in bowel or urinary habits, change in stools or urine, dysuria,hematuria,  rash, arthralgias, visual complaints, headache, numbness weakness or ataxia or problems with walking or coordination,  change in mood/affect or memory.        PAST MEDICAL HISTORY :   Past Medical History   Diagnosis  Date   .  Hypertension     .  Thyroid disease     .  GERD (gastroesophageal reflux disease)     .  Cancer     .  Dyslipidemia     .  Aortic insufficiency     .  Mitral insufficiency     .  History of nuclear stress test  02/26/2006       exercise myoview; normal pattern of perfusion; low risk scan        Past Surgical  History   Procedure  Laterality  Date   .  Thyroidectomy    1973   .  Joint replacement    2003   .  Dilation and curettage of uterus       .  Carpal tunnel release    1988   .  Rotator cuff repair    2006   .  H/o met test w/pft    04/02/2012       low risk; peak VO2 77% predicted   .  Cardiac catheterization    07/08/2008       normal coronaries   .  Transthoracic echocardiogram    09/25/2012       EF 55-60%; mild LVH & mild concentric hypertrophy; mild AV regurg; RV systolic pressure increase consistent with mild pulm HTN       Prior to Admission medications    Medication  Sig  Start Date  End Date  Taking?  Authorizing Provider   Acetaminophen (TYLENOL PO)  Take 1-2  tablets by mouth daily as needed (for pain).      Yes  Historical Provider, MD   albuterol (PROVENTIL HFA;VENTOLIN HFA) 108 (90 BASE) MCG/ACT inhaler  Inhale 1 puff into the lungs every 6 (six) hours as needed for wheezing or shortness of breath.      Yes  Historical Provider, MD   alendronate (FOSAMAX) 35 MG tablet  Take 35 mg by mouth every 7 (seven) days. Takes on Thursdays.  Take with a full glass of water on an empty stomach.      Yes  Historical Provider, MD   amLODipine (NORVASC) 10 MG tablet  Take 5 mg by mouth daily.       Yes  Historical Provider, MD   budesonide-formoterol (SYMBICORT) 160-4.5 MCG/ACT inhaler  Inhale 2 puffs into the lungs 2 (two) times daily.      Yes  Historical Provider, MD   irbesartan-hydrochlorothiazide (AVALIDE) 300-12.5 MG per tablet  Take 1 tablet by mouth daily.      Yes  Historical Provider, MD   levothyroxine (SYNTHROID, LEVOTHROID) 125 MCG tablet  Take 125 mcg by mouth daily before breakfast.      Yes  Historical Provider, MD   metoprolol succinate (TOPROL-XL) 25 MG 24 hr tablet  Take 12.5 mg by mouth daily.      Yes  Historical Provider, MD   montelukast (SINGULAIR) 10 MG tablet  Take 10 mg by mouth daily.  04/15/13    Yes  Historical Provider, MD   Multiple Vitamins-Minerals (MULTIVITAMIN WITH MINERALS) tablet  Take 1 tablet by mouth daily.      Yes  Historical Provider, MD   pantoprazole (PROTONIX) 40 MG tablet  Take 40 mg by mouth 2 (two) times daily.       Yes  Historical Provider, MD   ranitidine (ZANTAC) 300 MG tablet  Take 300 mg by mouth at bedtime.      Yes  Historical Provider, MD   EPINEPHrine (EPIPEN IJ)  Inject 1 each as directed once as needed (for reaction to milk products).        Historical Provider, MD       Allergies   Allergen  Reactions   .  Milk-Related Compounds  Other (See Comments)       REACTION: GI upset, projectile vomiting        FAMILY HISTORY:  Family History   Problem  Relation  Age of Onset   .  Lung cancer  Mother      .  Heart attack  Father     .  Heart failure  Maternal Grandfather     .  Heart attack  Paternal Grandfather        SOCIAL HISTORY:  reports that she quit smoking about 46 years ago. Her smoking use included Cigarettes. She has a 3 pack-year smoking history. She has never used smokeless tobacco. She reports that she drinks alcohol. She reports that she does not use illicit drugs.       SUBJECTIVE:  Comfortable at rest, no hemoptysis x 24 h   VITAL SIGNS: Temp:  [97.9 F (36.6 C)-98.4 F (36.9 C)] 97.9 F (36.6 C) (01/17 0623) Pulse Rate:  [63-76] 63 (01/17 0623) Resp:  [18] 18 (01/17 0623) BP: (131-188)/(54-89) 142/54 mmHg (01/17 0623) SpO2:  [94 %-97 %] 97 % (01/17 0938) Weight:  [165 lb 5.5 oz (75 kg)-182 lb 2 oz (82.611 kg)] 173 lb 1.6 oz (78.518 kg) (01/17 0623) FIO2  RA         INTAKE / OUTPUT: Intake/Output      01/16 0701 - 01/17 0700 01/17 0701 - 01/18 0700    P.O. 240     Total Intake(mL/kg) 240 (3.1)     Urine (mL/kg/hr) 1100     Total Output 1100      Net -860               PHYSICAL EXAMINATION: General:    Pleasant wf nad HEENT: nl dentition, turbinates, and orophanx. Nl external ear canals without cough reflex     NECK :  without JVD/Nodes/TM/ nl carotid upstrokes bilaterally     LUNGS: no acc muscle use, clear to A and P bilaterally without cough on insp or exp maneuvers     CV:  RRR  no s3 or murmur or increase in P2, no edema    ABD:  soft and nontender with nl excursion in the supine position. No bruits or organomegaly, bowel sounds nl   MS:  warm without deformities, calf tenderness, cyanosis or clubbing   SKIN: warm and dry without lesions     NEURO:  alert, approp, no deficits     LABS:   CBC   Recent Labs Lab  09/19/13 1058  09/19/13 1944  09/20/13 0500   WBC  13.5*   --   11.1*   HGB  12.1  11.6*  10.5*   HCT  34.8*  34.0*  30.1*   PLT  468*   --   409*      Coag's   Recent Labs Lab   09/19/13 1944  09/20/13 0500   APTT  28  28   INR  0.92  0.99      BMET   Recent Labs Lab  09/19/13 1058  09/20/13 0500   NA  136*  139   K  4.4  4.4   CL  97  101   CO2  25  25   BUN  25*  18   CREATININE  0.90  1.00   GLUCOSE  96  89      Electrolytes   Recent Labs Lab  09/19/13 1058  09/19/13 1944  09/20/13 0500   CALCIUM  9.9   --   9.1   MG   --   1.6   --       Sepsis Markers No results found for this basename: LATICACIDVEN, PROCALCITON, O2SATVEN,  in the last 168  hours ABG No results found for this basename: PHART, PCO2ART, PO2ART,  in the last 168 hours Liver Enzymes   Recent Labs Lab  09/19/13 1058  09/20/13 0500   AST  22  18   ALT  23  18   ALKPHOS  119*  97   BILITOT  0.4  0.6   ALBUMIN  3.8  3.1*      Cardiac Enzymes No results found for this basename: TROPONINI, PROBNP,  in the last 168 hours Glucose No results found for this basename: GLUCAP,  in the last 168 hours   Imaging Dg Chest 2 View   09/19/2013   CLINICAL DATA:  Hemoptysis.  Recent pneumonia  EXAM: CHEST  2 VIEW  COMPARISON:  None.  FINDINGS: Right middle lobe consolidation with volume loss and opacity. Left lung is clear. No effusion. No heart failure.  IMPRESSION: Right middle lobe consolidation. This could represent pneumonia. Postobstructive collapse is possible. Followup until clearing is suggested.   Electronically Signed   By: Franchot Gallo M.D.   On: 09/19/2013 12:13    Ct Chest Wo Contrast   09/19/2013   CLINICAL DATA:  73 year old female recently finished treatment for pneumonia. Lobe today with hemoptysis. Initial encounter.  EXAM: CT CHEST WITHOUT CONTRAST  TECHNIQUE: Multidetector CT imaging of the chest was performed following the standard protocol without IV contrast.  COMPARISON:  Chest radiographs from earlier today. [facility] medical Chest radiographs 09/03/2013. [facility] chest CTA 05/31/2011.  FINDINGS: Radiographs demonstrate  recent confluent right middle lobe versus inferior upper lobe consolidation.  The trachea and proximal airways are patent. There is abrupt termination of gas within the right middle lobe bronchus just after it emerges from the bronchus intermedius (series 3, image 28). There is then complete right middle lobe collapse, or consolidation but no additional air bronchograms.  No pericardial or pleural effusion. No contrast administered. No definite hilar or mediastinal lymphadenopathy.  No nodularity elsewhere in the right lung. Other Major airways are patent. Left lung is stable with multiple left lower lobe lung nodules, several densely calcified (series 3, image 40, 43) compatible with chronic granulomas.  Chronic postoperative changes to the left thyroid. Chronic mid thoracic disc and endplate degeneration. No acute or suspicious osseous lesion identified.  Negative visualized non contrast liver, gallbladder, spleen, pancreas, adrenal glands, kidneys common bowel.  IMPRESSION: 1. Complete drowning or collapse of the right middle lobe with abrupt termination of the right middle lobe bronchus just beyond its origin (series 3, image 28). Constellation of clinical and imaging data highly suspicious for an obstructing bronchial lesion. Recommend bronchoscopy. 2. Otherwise negative right lung. No definite hilar or mediastinal lymphadenopathy. 3. Otherwise stable appearance of the chest since 2012, including right lower lobe granulomas.   Electronically Signed   By: Lars Pinks M.D.   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       2)  Chronic cough likely unrelated to  #1 The most common causes of chronic cough in immunocompetent adults include the following: upper airway cough syndrome (UACS), previously referred to as postnasal drip syndrome (PNDS), which is caused by variety of rhinosinus conditions; (2) asthma; (3) GERD; (4) chronic bronchitis from cigarette smoking or other inhaled environmental irritants; (5) nonasthmatic eosinophilic bronchitis; and (6) bronchiectasis.    These conditions, singly or in combination, have accounted for up to 94% of the causes of chronic cough in prospective studies.    Other conditions have constituted no >6% of the causes in prospective studies These have included bronchogenic carcinoma, chronic interstitial pneumonia, sarcoidosis, left ventricular failure, ACEI-induced cough, and aspiration from a condition associated with pharyngeal dysfunction.     Chronic cough is often simultaneously caused by more than one condition. A single cause has been found from 38 to 82% of the time, multiple causes from 18 to 62%. Multiply caused cough has been the result of three diseases up to 42% of the time.      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   rec max rx for GERD and D/C fosfamax for now    .09/25/2013  FOB day: no further hemoptysis off asa x 3 days, no change H and P   Christinia Gully, MD Pulmonary and [place] [phone] After 5:30 PM or weekends, call [phone]

## 2013-09-25 NOTE — Progress Notes (Signed)
Video Bronchoscopy with good patient tolerance  Intervention bronchial wash  Intervention bronchial biopsy

## 2013-09-25 NOTE — Op Note (Signed)
Bronchoscopy Procedure Note  Date of Operation: 09/25/2013   Pre-op Diagnosis: obst RML  Post-op Diagnosis: same  Surgeon: Christinia Gully  Anesthesia: Monitored Local Anesthesia with Sedation  Operation: Video Flexible fiberoptic bronchoscopy, diagnostic   Findings: fleshing polypoid lesion obst rml  Specimen: BAL RML/ endobronchial bx   Estimated Blood Loss: min   Complications: none  Indications and History: See updated H and P same date. The risks, benefits, complications, treatment options and expected outcomes were discussed with the patient.  The possibilities of reaction to medication, pulmonary aspiration, perforation of a viscus, bleeding, failure to diagnose a condition and creating a complication requiring transfusion or operation were discussed with the patient who freely signed the consent.    Description of Procedure: The patient was re-examined in the bronchoscopy suite and the site of surgery properly noted/marked.  The patient was identified  and the procedure verified as Flexible Fiberoptic Bronchoscopy.  A Time Out was held and the above information confirmed.   After the induction of topical nasopharyngeal anesthesia, the patient was positioned  and the bronchoscope was passed through the R naris. The vocal cords were visualized and  1% buffered lidocaine 5 ml was topically placed onto the cords. The cords were nl. The scope was then passed into the trachea.  1% buffered lidocaine given topically. Airways inspected bilaterally to the subsegmental level with the following findings:  All airways normal to the subsegmental level x the RML severe fish mouthing and swelling with fleshy polypoid lesion obst orifice  Procedure: Lavage RML for cyt Epinephrine topically to RML orifice x one ampule  Endobronchial bx RML orifice    The Patient was taken to the Endoscopy Recovery area in satisfactory condition.  Attestation: I performed the procedure.  Christinia Gully,  MD Pulmonary and [place] [phone] After 5:30 PM or weekends, call [phone]

## 2013-09-25 NOTE — OR Nursing (Signed)
Md made aware of pts hemodynamic status BP 68/48 hr 60 sat 100 and cbg 150. Dr Melvyn Novas ordered fluid challenge 1 liter of normal saline.

## 2013-09-25 NOTE — Discharge Instructions (Signed)
Flexible Bronchoscopy, Care After These instructions give you information on caring for yourself after your procedure. Your doctor may also give you more specific instructions. Call your doctor if you have any problems or questions after your procedure. HOME CARE  Do not eat or drink anything for 2 hours after your procedure. If you try to eat or drink before the medicine wears off, food or drink could go into your lungs. You could also burn yourself.  After 2 hours have passed and when you can cough and gag normally, you may eat soft food and drink liquids slowly.  The day after the test, you may eat your normal diet.  You may do your normal activities.  Keep all doctor visits. GET HELP RIGHT AWAY IF:      You get more and more short of breath.  You get lightheaded.  You feel like you are going to pass out (faint).  You have chest pain.  You have new problems that worry you.  You cough up more than a little blood.  You cough up more blood than before. MAKE SURE YOU:  Understand these instructions.  Will watch your condition.  Will get help right away if you are not doing well or get worse. Document Released: 06/18/2009 Document Revised: 06/11/2013 Document Reviewed: 04/25/2013 [facility] Patient Information 2014 [place].  Do not eat or drink anything until 1030am today January 22,2015

## 2013-09-26 ENCOUNTER — Encounter (HOSPITAL_COMMUNITY): Payer: Self-pay | Admitting: Internal Medicine

## 2013-09-27 ENCOUNTER — Encounter: Payer: Self-pay | Admitting: Internal Medicine

## 2013-09-29 ENCOUNTER — Telehealth: Payer: Self-pay | Admitting: Internal Medicine

## 2013-09-29 DIAGNOSIS — R0602 Shortness of breath: Secondary | ICD-10-CM

## 2013-09-29 DIAGNOSIS — R042 Hemoptysis: Secondary | ICD-10-CM

## 2013-09-29 NOTE — Telephone Encounter (Signed)
Called and spoke with pt. Made her aware MW is not in today. Please advise MW if pt can be worked in on your schedule? thanks

## 2013-09-29 NOTE — Telephone Encounter (Signed)
lmomtcb x1 for pt 

## 2013-09-29 NOTE — Telephone Encounter (Signed)
Yes, work her in towards end of week with cxr first "f/u RML atx"

## 2013-09-29 NOTE — Telephone Encounter (Signed)
appt set and cxr ordered.[place] Bing, CMA

## 2013-09-29 NOTE — Telephone Encounter (Signed)
Pt returned call & ccan be reached at [phone].me

## 2013-10-02 ENCOUNTER — Ambulatory Visit (INDEPENDENT_AMBULATORY_CARE_PROVIDER_SITE_OTHER)
Admission: RE | Admit: 2013-10-02 | Discharge: 2013-10-02 | Disposition: A | Discharge status: Home / Self Care | Payer: Medicare Other | Source: Ambulatory Visit | Attending: Internal Medicine | Admitting: Internal Medicine

## 2013-10-02 ENCOUNTER — Encounter: Payer: Self-pay | Admitting: Internal Medicine

## 2013-10-02 ENCOUNTER — Ambulatory Visit (INDEPENDENT_AMBULATORY_CARE_PROVIDER_SITE_OTHER): Payer: Medicare Other | Admitting: Internal Medicine

## 2013-10-02 VITALS — BP 126/64 | HR 65 | Temp 98.1°F | Ht 59.75 in | Wt 168.0 lb

## 2013-10-02 DIAGNOSIS — J9811 Atelectasis: Secondary | ICD-10-CM

## 2013-10-02 DIAGNOSIS — R042 Hemoptysis: Secondary | ICD-10-CM

## 2013-10-02 DIAGNOSIS — R0602 Shortness of breath: Secondary | ICD-10-CM

## 2013-10-02 DIAGNOSIS — J9819 Other pulmonary collapse: Secondary | ICD-10-CM

## 2013-10-02 NOTE — Progress Notes (Signed)
Subjective:     Patient ID: Kimberly [name], female   DOB: 09-04-1941, 73 y.o.   MRN: [id number]  HPI  36 yowf quit smoking 49 y pta with cough x 3 years "like a tickle" with neg eval by Chase Caller including neg CT chest 05/31/11   then acutely worse since late Nov 2014 with dx of bronchitis then pna rx zpak and avelox but persistent cough > basline, mostly dry until 1 day pta with hemoptysis x 4 hours x one half cup to ER with dx of RML pna and pulm asked to see am 1/17 with dx of ? RML syndrome vs ca.  FOB 09/25/13 extrinsic swelling. Endobronchial bx of orifice neg, BAL of MRL c/w atypical clusters of cells  10/02/2013 f/u ov/[facility] [employer] re:  Chief Complaint  Patient presents with  . Follow-up    Pt states no changes in her breathing. Her cough is some improved. She still c/o feeling fatigued.       No obvious day to day or daytime variabilty or assoc sob or cp or chest tightness, subjective wheeze overt sinus or hb symptoms. No unusual exp hx or h/o childhood pna/ asthma or knowledge of premature birth.  Sleeping ok without nocturnal  or early am exacerbation  of respiratory  c/o's or need for noct saba. Also denies any obvious fluctuation of symptoms with weather or environmental changes or other aggravating or alleviating factors except as outlined above   Current Medications, Allergies, Complete Past Medical History, Past Surgical History, Family History, and Social History were reviewed in [employer] record.  ROS  The following are not active complaints unless bolded sore throat, dysphagia, dental problems, itching, sneezing,  nasal congestion or excess/ purulent secretions, ear ache,   fever, chills, sweats, unintended wt loss, pleuritic or exertional cp, hemoptysis,  orthopnea pnd or leg swelling, presyncope, palpitations, heartburn, abdominal pain, anorexia, nausea, vomiting, diarrhea  or change in bowel or urinary habits, change in stools or urine, dysuria,hematuria,   rash, arthralgias, visual complaints, headache, numbness weakness or ataxia or problems with walking or coordination,  change in mood/affect or memory.        Review of Systems     Objective:   Physical Exam amb wf nad  Wt Readings from Last 3 Encounters:  10/02/13 168 lb (76.204 kg)  09/24/13 165 lb (74.844 kg)  09/20/13 173 lb 1.6 oz (78.518 kg)      HEENT: nl dentition, turbinates, and orophanx. Nl external ear canals without cough reflex   NECK :  without JVD/Nodes/TM/ nl carotid upstrokes bilaterally   LUNGS: no acc muscle use, clear to A and P bilaterally without cough on insp or exp maneuvers   CV:  RRR  no s3 or murmur or increase in P2, no edema   ABD:  soft and nontender with nl excursion in the supine position. No bruits or organomegaly, bowel sounds nl  MS:  warm without deformities, calf tenderness, cyanosis or clubbing  SKIN: warm and dry without lesions    NEURO:  alert, approp, no deficits    CXR  10/02/2013 :  Stable appearance of chest: Persistent right middle lobe collapse which may be secondary to reported suspected endobronchial lesion on prior CT of the chest.      Assessment:

## 2013-10-02 NOTE — Patient Instructions (Signed)
Please see patient coordinator before you leave today  to schedule PET scan and I will call you with results and go from there.  In meantime ok to restart a baby aspirin daily  But stop immediately if further bleeding

## 2013-10-02 NOTE — Assessment & Plan Note (Signed)
CT chest s contrast 09/19/13  Complete drowning or collapse of the right middle lobe with  abrupt termination of the right middle lobe bronchus just beyond its  origin (series 3, image 28). Constellation of clinical and imaging  data highly suspicious for an obstructing bronchial lesion.  Recommend bronchoscopy.  2. Otherwise negative right lung. No definite hilar or mediastinal  lymphadenopathy. - FOB 09/25/13 >  Endobronchial swelling/ fish mouth deformity RML with fleshy polypoid lesion at orifice bx x3 >>> bx neg but cyto with groups of atypcial cells  Discussed in detail all the  indications, usual  risks and alternatives  relative to the benefits with patient who agrees to proceed with bronchoscopy with biopsy.   rec  PET next, then consider T surgery eval

## 2013-10-06 ENCOUNTER — Ambulatory Visit (INDEPENDENT_AMBULATORY_CARE_PROVIDER_SITE_OTHER): Payer: Medicare Other | Admitting: Internal Medicine

## 2013-10-06 ENCOUNTER — Encounter: Payer: Self-pay | Admitting: Internal Medicine

## 2013-10-06 ENCOUNTER — Telehealth: Payer: Self-pay | Admitting: Internal Medicine

## 2013-10-06 DIAGNOSIS — J9819 Other pulmonary collapse: Secondary | ICD-10-CM

## 2013-10-06 DIAGNOSIS — J9811 Atelectasis: Secondary | ICD-10-CM

## 2013-10-06 LAB — PULMONARY FUNCTION TEST
DL/VA % pred: 93 %
DL/VA: 3.84 ml/min/mmHg/L
DLCO unc % pred: 79 %
DLCO unc: 13.95 ml/min/mmHg
FEF 25-75 Post: 1.7 L/sec
FEF 25-75 Pre: 1.12 L/sec
FEF2575-%Change-Post: 51 %
FEF2575-%PRED-POST: 105 %
FEF2575-%Pred-Pre: 69 %
FEV1-%Change-Post: 11 %
FEV1-%PRED-POST: 91 %
FEV1-%Pred-Pre: 81 %
FEV1-Post: 1.64 L
FEV1-Pre: 1.47 L
FEV1FVC-%Change-Post: 9 %
FEV1FVC-%Pred-Pre: 99 %
FEV6-%Change-Post: 1 %
FEV6-%PRED-PRE: 85 %
FEV6-%Pred-Post: 87 %
FEV6-PRE: 1.95 L
FEV6-Post: 1.98 L
FEV6FVC-%CHANGE-POST: 0 %
FEV6FVC-%PRED-PRE: 105 %
FEV6FVC-%Pred-Post: 104 %
FVC-%Change-Post: 2 %
FVC-%Pred-Post: 82 %
FVC-%Pred-Pre: 81 %
FVC-PRE: 1.95 L
FVC-Post: 1.99 L
PRE FEV6/FVC RATIO: 100 %
Post FEV1/FVC ratio: 83 %
Post FEV6/FVC ratio: 100 %
Pre FEV1/FVC ratio: 76 %
RV % PRED: 77 %
RV: 1.51 L
TLC % pred: 87 %
TLC: 3.76 L

## 2013-10-06 MED ORDER — TRAMADOL HCL 50 MG PO TABS: ORAL_TABLET | ORAL | Status: DC

## 2013-10-06 NOTE — Telephone Encounter (Signed)
lmomtcb x1 for pt 

## 2013-10-06 NOTE — Telephone Encounter (Signed)
Obviously no more asa  Keep r side down, left side up and stay in that position as much as possible until bleeding is gone (off feet as much as possible) - rx cough with desym 2 tsp every 12 h and supplement with tramadol 50 mg 1-2 every 4 #40

## 2013-10-06 NOTE — Telephone Encounter (Signed)
Called spoke with patient who reported her coughing and hemoptysis began again on 1.30.15.  She has had 4 episodes since, each lasting about 15-61mins.  Pt stated that the hemoptysis is "not pouring blood like it did before" but is instead "clotty."  Pt is using sugarless candy, drinking sips of water and Robitussin that she doesn't feel is helping.  Pt denies any f/c/s, chest tightness, wheezing.  Pt did stop her aspirin.  Last ov 1.29.15 w/ MW: Patient Instructions     Please see patient coordinator before you leave today to schedule PET scan and I will call you with results and go from there.  In meantime ok to restart a baby aspirin daily But stop immediately if further bleeding     Dr Melvyn Novas please advise, thank you.

## 2013-10-06 NOTE — Telephone Encounter (Signed)
Pt called back. Made aware of recs. She voiced her understanding and needed nothing further. RX for tramadol called in. Nothing further needed

## 2013-10-06 NOTE — Progress Notes (Signed)
Quick Note:  Spoke with pt and notified of results per Dr. Wert. Pt verbalized understanding and denied any questions.  ______ 

## 2013-10-06 NOTE — Progress Notes (Signed)
PFT done today. 

## 2013-10-15 ENCOUNTER — Encounter (HOSPITAL_COMMUNITY): Payer: Self-pay

## 2013-10-15 ENCOUNTER — Encounter (HOSPITAL_COMMUNITY)
Admission: RE | Admit: 2013-10-15 | Discharge: 2013-10-15 | Disposition: A | Discharge status: Home / Self Care | Payer: Medicare Other | Source: Ambulatory Visit | Attending: Internal Medicine | Admitting: Internal Medicine

## 2013-10-15 DIAGNOSIS — J9819 Other pulmonary collapse: Secondary | ICD-10-CM | POA: Insufficient documentation

## 2013-10-15 DIAGNOSIS — J9811 Atelectasis: Secondary | ICD-10-CM

## 2013-10-15 LAB — GLUCOSE, CAPILLARY: Glucose-Capillary: 89 mg/dL (ref 70–99)

## 2013-10-15 MED ORDER — FLUDEOXYGLUCOSE F - 18 (FDG) INJECTION
9.3000 | Freq: Once | INTRAVENOUS | Status: AC | PRN
  Administered 2013-10-15: 9.3 via INTRAVENOUS

## 2013-10-17 ENCOUNTER — Telehealth: Payer: Self-pay | Admitting: Internal Medicine

## 2013-10-17 DIAGNOSIS — J9811 Atelectasis: Secondary | ICD-10-CM

## 2013-10-17 NOTE — Telephone Encounter (Signed)
I called and spoke wt/ pt. She reports she spoke with MW on wed about PET scan. She is suppose to have a BX. I don't see anything order on pt. Please advise MW thanks

## 2013-10-17 NOTE — Telephone Encounter (Signed)
Discussed with pt, will send to T surgery next

## 2013-10-21 ENCOUNTER — Encounter: Payer: Self-pay | Admitting: Thoracic Surgery (Cardiothoracic Vascular Surgery)

## 2013-10-21 ENCOUNTER — Institutional Professional Consult (permissible substitution) (INDEPENDENT_AMBULATORY_CARE_PROVIDER_SITE_OTHER): Payer: Medicare Other | Admitting: Thoracic Surgery (Cardiothoracic Vascular Surgery)

## 2013-10-21 VITALS — Resp 20 | Ht 60.0 in | Wt 162.0 lb

## 2013-10-21 DIAGNOSIS — J9819 Other pulmonary collapse: Secondary | ICD-10-CM

## 2013-10-21 DIAGNOSIS — R918 Other nonspecific abnormal finding of lung field: Secondary | ICD-10-CM

## 2013-10-21 DIAGNOSIS — R222 Localized swelling, mass and lump, trunk: Secondary | ICD-10-CM

## 2013-10-21 NOTE — Progress Notes (Signed)
PCP is [name], MD Referring Provider is Tanda Rockers, MD  Chief Complaint  Patient presents with  . Lung Mass    Surgical eval, PET Scan 10/15/13, Chest CT 09/19/13     HPI: 73 year old woman who presented with a chief complaint hemoptysis.  Kimberly [name] is a 73 year old woman with a remote history of tobacco use (quit in 1967) any family history of lung cancer in both her mother and maternal uncle. She had been evaluated for cough and shortness of breath back in 2010. No mass was found at that time. She has had a daily cough for about 3 years now. This usually is dry. In late November she developed a worsening cough associated with fever and below mucous. She was treated with antibiotics and had some improvement.   However on January 16 she had significant hemoptysis. She described this as "about a cupful." She went to the hospital. A CT of the chest was done which showed right middle lobe collapse with a possible mass. She saw Dr. Melvyn Novas and had a bronchoscopy. That showed extrinsic compression of the right middle lobe bronchus. Biopsy showed only benign epithelial cells, however cytology did show a few atypical cells. A PET CT was done on February 11 which showed the right middle lobe mass to have an SUV of 26. There was also an unexpected finding of a subcarinal lymph node with an SUV of 10.7. This node measured 1.1 cm in diameter.  She does have wheezing. She does get short of breath with exertion. If she walks up a flight of stairs she will be admitted and after rest. However she can walk for long distances on level ground without difficulty. She's lost 13 pounds in the last 3 months. She remains active is by herself. She is retired but does take care of all of her own needs.  ECOG/ZUBROD= 0     Past Medical History  Diagnosis Date  . Hypertension   . Thyroid disease   . GERD (gastroesophageal reflux disease)   . Cancer   . Dyslipidemia   . Aortic insufficiency   .  Mitral insufficiency   . History of nuclear stress test 02/26/2006    exercise myoview; normal pattern of perfusion; low risk scan     Past Surgical History  Procedure Laterality Date  . Thyroidectomy  1973  . Joint replacement  2003  . Dilation and curettage of uterus    . Carpal tunnel release  1988  . Rotator cuff repair  2006  . H/o met test w/pft  04/02/2012    low risk; peak VO2 77% predicted  . Cardiac catheterization  07/08/2008    normal coronaries  . Transthoracic echocardiogram  09/25/2012    EF 55-60%; mild LVH & mild concentric hypertrophy; mild AV regurg; RV systolic pressure increase consistent with mild pulm HTN  . Video bronchoscopy Bilateral 09/25/2013    Procedure: VIDEO BRONCHOSCOPY WITHOUT FLUORO;  Surgeon: Tanda Rockers, MD;  Location: [facility];  Service: Cardiopulmonary;  Laterality: Bilateral;    Family History  Problem Relation Age of Onset  . Lung cancer Mother   . Heart attack Father   . Heart failure Maternal Grandfather   . Heart attack Paternal Grandfather     Social History History  Substance Use Topics  . Smoking status: Former Smoker -- 0.50 packs/day for 6 years    Types: Cigarettes    Quit date: 09/05/1967  . Smokeless tobacco: Never Used  . Alcohol Use: Yes  Comment: occasional     Current Outpatient Prescriptions  Medication Sig Dispense Refill  . albuterol (PROAIR HFA) 108 (90 BASE) MCG/ACT inhaler Inhale 2 puffs into the lungs every 6 (six) hours as needed for wheezing or shortness of breath.      [name] amLODipine (NORVASC) 10 MG tablet Take 5 mg by mouth daily.       [name] atorvastatin (LIPITOR) 20 MG tablet Take 20 mg by mouth daily.      . budesonide-formoterol (SYMBICORT) 160-4.5 MCG/ACT inhaler Inhale 2 puffs into the lungs 2 (two) times daily.      . Cholecalciferol (VITAMIN D-3) 1000 UNITS CAPS Take by mouth daily.      [name] EPINEPHrine (EPIPEN IJ) Inject 1 each as directed once as needed (for reaction to milk products).      [name]  HYDROcodone-homatropine (HYCODAN) 5-1.5 MG/5ML syrup Take 5 mLs by mouth every 6 (six) hours as needed for cough.      . irbesartan-hydrochlorothiazide (AVALIDE) 300-12.5 MG per tablet Take 1 tablet by mouth daily.      [name] levothyroxine (SYNTHROID, LEVOTHROID) 125 MCG tablet Take 125 mcg by mouth daily before breakfast.      . metoprolol succinate (TOPROL-XL) 25 MG 24 hr tablet Take 12.5 mg by mouth daily.      . montelukast (SINGULAIR) 10 MG tablet Take 10 mg by mouth daily.      . Multiple Vitamins-Minerals (MULTIVITAMIN WITH MINERALS) tablet Take 1 tablet by mouth daily.      . pantoprazole (PROTONIX) 40 MG tablet Take 40 mg by mouth 2 (two) times daily.       . ranitidine (ZANTAC) 300 MG tablet Take 300 mg by mouth at bedtime.       No current facility-administered medications for this visit.    Allergies  Allergen Reactions  . Milk-Related Compounds Other (See Comments)    REACTION: GI upset, projectile vomiting    Review of Systems  Constitutional: Positive for fatigue.  Respiratory: Positive for cough (Including hemoptysis), shortness of breath (One flight of stairs) and wheezing. Negative for chest tightness.   Cardiovascular: Negative for chest pain.  Gastrointestinal:       Reflux  Musculoskeletal: Positive for arthralgias.  Neurological: Positive for syncope (most recently 2 weeks ago).  All other systems reviewed and are negative.    Resp 20  Ht 5' (1.524 m)  Wt 162 lb (73.483 kg)  BMI 31.64 kg/m2  SpO2 95% Physical Exam  Vitals reviewed. Constitutional: She is oriented to person, place, and time. No distress.  Obese  HENT:  Head: Normocephalic and atraumatic.  Eyes: EOM are normal. Pupils are equal, round, and reactive to light.  Neck: Normal range of motion. Neck supple. No thyromegaly present.  Cardiovascular: Normal rate and regular rhythm.   Murmur (2/6 diastolic) heard. Pulmonary/Chest: Effort normal. She has no wheezes. She has no rales.  Abdominal:  Soft. There is no tenderness.  Musculoskeletal: She exhibits no edema.  Lymphadenopathy:    She has no cervical adenopathy.  Neurological: She is alert and oriented to person, place, and time. No cranial nerve deficit.  No focal motor deficit  Skin: Skin is warm and dry.  Psychiatric: She has a normal mood and affect.     Diagnostic Tests: CT chest CT CHEST WITHOUT CONTRAST  TECHNIQUE:  Multidetector CT imaging of the chest was performed following the  standard protocol without IV contrast.  COMPARISON: Chest radiographs from earlier today. [facility]  medical Chest radiographs 09/03/2013. [facility] chest CTA 05/31/2011.  FINDINGS:  Radiographs demonstrate recent confluent right middle lobe versus  inferior upper lobe consolidation.  The trachea and proximal airways are patent. There is abrupt  termination of gas within the right middle lobe bronchus just after  it emerges from the bronchus intermedius (series 3, image 28). There  is then complete right middle lobe collapse, or consolidation but no  additional air bronchograms.  No pericardial or pleural effusion. No contrast administered. No  definite hilar or mediastinal lymphadenopathy.  No nodularity elsewhere in the right lung. Other Major airways are  patent. Left lung is stable with multiple left lower lobe lung  nodules, several densely calcified (series 3, image 40, 43)  compatible with chronic granulomas.  Chronic postoperative changes to the left thyroid. Chronic mid  thoracic disc and endplate degeneration. No acute or suspicious  osseous lesion identified.  Negative visualized non contrast liver, gallbladder, spleen,  pancreas, adrenal glands, kidneys common bowel.  IMPRESSION:  1. Complete drowning or collapse of the right middle lobe with  abrupt termination of the right middle lobe bronchus just beyond its  origin (series 3, image 28). Constellation of clinical and imaging  data highly  suspicious for an obstructing bronchial lesion.  Recommend bronchoscopy.  2. Otherwise negative right lung. No definite hilar or mediastinal  lymphadenopathy.  3. Otherwise stable appearance of the chest since 2012, including  right lower lobe granulomas.  Electronically Signed  By: Lars Pinks M.D.  On: 09/19/2013 15:19  PET/CT NUCLEAR MEDICINE PET SKULL BASE TO THIGH  FASTING BLOOD GLUCOSE: Value: 89 mg/dl  TECHNIQUE:  9.3 mCi F-18 FDG was injected intravenously. Full-ring PET imaging  was performed from the skull base to thigh after the radiotracer. CT  data was obtained and used for attenuation correction and anatomic  localization.  COMPARISON: US BIOPSY dated 02/23/2009; CT CHEST W/O CM dated  09/19/2013  FINDINGS:  NECK  No hypermetabolic lymph nodes in the neck.  CHEST  There is mass extending from the right hilum into the right middle  lobe which constricts the right middle lobe bronchus results in  atelectasis. This mass measures approximately 5.5 by 3.3 cm and i s  intensely hypermetabolic with SUV max 56.2. There is a  hypermetabolic subcarinal lymph node measuring 10 mm short axis with  SUV max 10.7.  No additional hypermetabolic lymph nodes. There is a calcified non  hypermetabolic nodule at the left lung base. No hypermetabolic  supraclavicular adenopathy.  ABDOMEN/PELVIS  No abnormal hypermetabolic activity within the liver, pancreas,  adrenal glands, or spleen. No hypermetabolic lymph nodes in the  abdomen or pelvis.  SKELETON  No focal hypermetabolic activity to suggest skeletal metastasis.  IMPRESSION:  1. Hypermetabolic right middle lobe mass constricting the right  middle lobe bronchus.  2. Hypermetabolic subcarinal nodal metastasis.  3. Staging by FDG PET imaging T2 N2 M0  These results will be called to the ordering clinician or  representative by the Radiologist Assistant, and communication  documented in the PACS Dashboard.  Electronically Signed   By: Suzy Bouchard M.D.  On: 10/15/2013 15:16  Cytology Diagnosis BRONCHIAL LAVAGE, RML(SPECIMEN 1 OF 1 COLLECTED 09/25/13): RARE ATYPICAL CELLS PRESENT. PLEASE SEE COMMENT. Aldona Bar MD Pathologist, Electronic Signature (Case signed 09/26/2013) Specimen Clinical Information hemoptysis, RML obst Source Bronchial Lavage, RML (specimen 1 of 1 collected 09/25/13) Gross Specimen: Received is/are 12cc's of mucoid material. (BS:bs) Prepared: # Smears: 2 # Concentration Technique Slides (i.e. ThinPrep): 0 #  Cell Block: 0 Additional Studies: n/a Comment There are two groups of markedly atypical glandular cells with prominent nucleoli without cilia. The findings are suspicious for malignancy. Clinical and radiographic correlation is highly recommended.  Pulmonary function tests FVC 1.95 (81%) FEV1 1.47 (81%), 1.64 (91%) after bronchodilators FEV1/FVC ratio 76% DLCO 79%  Echocardiogram 09/25/2012 Study Conclusions  - Left ventricle: The cavity size was normal. Wall thickness was increased in a pattern of mild LVH. There was mild concentric hypertrophy. Systolic function was normal. The estimated ejection fraction was in the range of 55% to 60%. Wall motion was normal; there were no regional wall motion abnormalities. Doppler parameters are consistent with abnormal left ventricular relaxation (grade 1 diastolic dysfunction). - Aortic valve: Trileaflet; mildly thickened, mildly calcified leaflets. Mild regurgitation. - Mitral valve: Mildly calcified annulus. - Right ventricle: Systolic pressure was increased. - Atrial septum: No defect or patent foramen ovale was identified. - Pulmonary arteries: PA peak pressure: 74m Hg (S). Impressions:  - The right ventricular systolic pressure was increased consistent with mild pulmonary hypertension. Transthoracic echocardiography. M-mode, complete 2D, spectral Doppler, and color Doppler. Height: Height: 149.9cm. Height: 59in.  Weight: Weight: 80.7kg. Weight: 177.6lb. Body mass index: BMI: 36kg/m^2. Body surface area: BSA: 1.730m. Blood pressure: 160/78. Patient status: Outpatient. Location: Echo laboratory.    Impression: Kimberly [name] a very nice [age]ear old woman with a very limited history of tobacco use, but a strong family history for lung cancer. She began having hemoptysis about a month ago. That led to a CT which showed collapse of the right middle lobe. Bronchoscopy did not show and endobronchial lesion, but rather showed extrinsic compression of the right middle lobe orifice. Biopsies were negative but BAL did show some atypical cells. A PET CT was done which showed the right middle lobe mass to have a very high SUV of 26. Unfortunately it also showed a subcarinal node with an SUV of 10. It is rather high particular given that it only measured 1.1 cm in diameter, and was unremarkable on CT.  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 There does however exist the possibility that this could all be infectious with a reactive adenopathy. However it needs to be lung cancer unless he could be proven otherwise.  Based on the appearance of the CT I would recommended proceeding to surgery. However given the findings on PET CT we need to clear the subcarinal node before putting her through a surgical resection. I recommended that we proceed with bronchoscopy, endobronchial ultrasound, and possible mediastinoscopy to re-sample the primary lesion as well as the subcarinal lymph node.  I described the proposed operation to her. She understands that we will do this under general anesthesia in the operating room. We will plan to do this on an outpatient basis. We will do endobronchial ultrasound first to sample the nodes. While we are waiting for pathology to look at those specimens, we will take additional biopsies of the right middle lobe mass. If the endobronchial ultrasound specimens are negative for tumor we  would then proceed with mediastinoscopy for definitive biopsy of the subcarinal nodes.  We discussed the indications, risks, benefits, and alternatives. She understands the risk include those associated with general anesthesia. She understands also that there are risks of bleeding, pneumothorax, failure to make a diagnosis, esophageal injury, recurrent nerve injury with hoarseness, and stroke. She accepts those risks and wishes to proceed.  I do not believe that she needs cardiology clearance prior to the endoscopic procedure. That  she would likely need cardiology clearance prior to any major thoracic resection. She is seeing Dr. Debara Pickett later this week, so we can get his input as to whether any further tests would be needed.  In the meantime we will plan to proceed with bronchoscopy, endobronchial ultrasound, possible mediastinal endoscopy on Thursday, February 26.

## 2013-10-22 ENCOUNTER — Encounter (HOSPITAL_COMMUNITY): Payer: Self-pay | Admitting: Pharmacy Technician

## 2013-10-22 ENCOUNTER — Other Ambulatory Visit: Payer: Self-pay | Admitting: *Deleted

## 2013-10-22 DIAGNOSIS — R590 Localized enlarged lymph nodes: Secondary | ICD-10-CM

## 2013-10-22 DIAGNOSIS — R918 Other nonspecific abnormal finding of lung field: Secondary | ICD-10-CM

## 2013-10-24 ENCOUNTER — Ambulatory Visit (INDEPENDENT_AMBULATORY_CARE_PROVIDER_SITE_OTHER): Payer: Medicare Other | Admitting: Internal Medicine

## 2013-10-24 ENCOUNTER — Encounter: Payer: Self-pay | Admitting: Internal Medicine

## 2013-10-24 VITALS — BP 148/70 | HR 59 | Ht 59.75 in | Wt 165.5 lb

## 2013-10-24 DIAGNOSIS — I10 Essential (primary) hypertension: Secondary | ICD-10-CM

## 2013-10-24 DIAGNOSIS — E785 Hyperlipidemia, unspecified: Secondary | ICD-10-CM

## 2013-10-24 DIAGNOSIS — R55 Syncope and collapse: Secondary | ICD-10-CM

## 2013-10-24 DIAGNOSIS — R0602 Shortness of breath: Secondary | ICD-10-CM

## 2013-10-24 NOTE — Patient Instructions (Signed)
Your physician wants you to follow-up in:  6 months. You will receive a reminder letter in the mail two months in advance. If you don't receive a letter, please call our office to schedule the follow-up appointment.   

## 2013-10-27 ENCOUNTER — Encounter: Payer: Self-pay | Admitting: Internal Medicine

## 2013-10-27 ENCOUNTER — Other Ambulatory Visit (HOSPITAL_COMMUNITY): Payer: Self-pay | Admitting: *Deleted

## 2013-10-27 NOTE — Progress Notes (Signed)
OFFICE NOTE  Chief Complaint:  Follow up appointment  Primary Care Physician: [name], MD  HPI:  Kimberly [name] is a 73 year old female who recently has been having some chest discomfort and shortness of breath as well as 2 episodes of syncope, both of which were during stressful situations, once while on a ladder. We went ahead and checked an echocardiogram which essentially shows only mild abnormalities. She did have a loudly calcified aortic valve which was not stenotic; however, there was mild regurgitation. There was mild LVH with EF 55% to 60% and grade 1 diastolic dysfunction. She had borderline pulmonary hypertension. A monitor was also worn by her from September 13, 2012 to October 12, 2012. This showed a predominantly sinus rhythm with PACs. There was, however, 1 episode of a nonsustained ventricular tachycardia which was about 5 beats. Of course she potentially would have to had longer episodes of NSVT this could have been playing a role in her syncopal event. I do not think she, however, is at high risk for lethal sustained VT given her normal ejection fraction and no clear evidence of ischemia. As you recall, she had a low-risk MET-test on April 02, 2012, with a peak VO2 of 77% predicted without any evidence of an ischemic response. This would make it quite unlikely that she has underlying coronary blockage that is responsible for her episode. I have, however, recommended adding a beta blocker to her regimen which will additionally help control her recent uncontrolled hypertension. This is Toprol XL 12.5 mg daily. It should help also with the VT. she is continuing to take low-dose Toprol and has had no further episodes of passing out in the past 6 months. She was thoroughly evaluated by a neurologist and felt to possibly be having a vasodepressive syncope. There was lower extremity.swelling and compression stockings were recommended.  Kimberly [name].  Unfortunately she tells me she's had a couple of other vasovagal syncopal episodes with a clear prodrome warning that she may pass out. Both of these were related to stress or bad prognostic information. She also recently was told that she may have a lung cancer. She is scheduled for a biopsy within a few days.  PMHx:  Past Medical History  Diagnosis Date  . Hypertension   . Thyroid disease   . GERD (gastroesophageal reflux disease)   . Cancer   . Dyslipidemia   . Aortic insufficiency   . Mitral insufficiency   . History of nuclear stress test 02/26/2006    exercise myoview; normal pattern of perfusion; low risk scan     Past Surgical History  Procedure Laterality Date  . Thyroidectomy  1973  . Joint replacement  2003  . Dilation and curettage of uterus    . Carpal tunnel release  1988  . Rotator cuff repair  2006  . H/o met test w/pft  04/02/2012    low risk; peak VO2 77% predicted  . Cardiac catheterization  07/08/2008    normal coronaries  . Transthoracic echocardiogram  09/25/2012    EF 55-60%; mild LVH & mild concentric hypertrophy; mild AV regurg; RV systolic pressure increase consistent with mild pulm HTN  . Video bronchoscopy Bilateral 09/25/2013    Procedure: VIDEO BRONCHOSCOPY WITHOUT FLUORO;  Surgeon: Tanda Rockers, MD;  Location: [facility];  Service: Cardiopulmonary;  Laterality: Bilateral;    FAMHx:  Family History  Problem Relation Age of Onset  . Lung cancer Mother   . Heart attack  Father   . Heart failure Maternal Grandfather   . Heart attack Paternal Grandfather     SOCHx:   reports that she quit smoking about 46 years ago. Her smoking use included Cigarettes. She has a 3 pack-year smoking history. She has never used smokeless tobacco. She reports that she drinks alcohol. She reports that she does not use illicit drugs.  ALLERGIES:  Allergies  Allergen Reactions  . Milk-Related Compounds Other (See Comments)    REACTION: GI upset, projectile vomiting      ROS: A comprehensive review of systems was negative except for: Cardiovascular: positive for lower extremity edema and syncope  HOME MEDS: Current Outpatient Prescriptions  Medication Sig Dispense Refill  . albuterol (PROAIR HFA) 108 (90 BASE) MCG/ACT inhaler Inhale 2 puffs into the lungs every 6 (six) hours as needed for wheezing or shortness of breath.      [name] amLODipine (NORVASC) 10 MG tablet Take 5 mg by mouth daily.       [name] atorvastatin (LIPITOR) 20 MG tablet Take 20 mg by mouth daily.      . budesonide-formoterol (SYMBICORT) 160-4.5 MCG/ACT inhaler Inhale 2 puffs into the lungs 2 (two) times daily.      . Cholecalciferol (VITAMIN D-3) 1000 UNITS CAPS Take by mouth daily.      [name] EPINEPHrine (EPIPEN IJ) Inject 1 each as directed once as needed (for reaction to milk products).      [name] HYDROcodone-homatropine (HYCODAN) 5-1.5 MG/5ML syrup Take 5 mLs by mouth every 6 (six) hours as needed for cough.      . irbesartan-hydrochlorothiazide (AVALIDE) 300-12.5 MG per tablet Take 1 tablet by mouth daily.      [name] levothyroxine (SYNTHROID, LEVOTHROID) 125 MCG tablet Take 125 mcg by mouth daily before breakfast.      . metoprolol succinate (TOPROL-XL) 25 MG 24 hr tablet Take 12.5 mg by mouth daily.      . montelukast (SINGULAIR) 10 MG tablet Take 10 mg by mouth daily.      . Multiple Vitamins-Minerals (MULTIVITAMIN WITH MINERALS) tablet Take 1 tablet by mouth daily.      . pantoprazole (PROTONIX) 40 MG tablet Take 40 mg by mouth 2 (two) times daily.       . ranitidine (ZANTAC) 300 MG tablet Take 300 mg by mouth at bedtime.       No current facility-administered medications for this visit.    LABS/IMAGING: No results found for this or any previous visit (from the past 48 hour(s)). No results found.  VITALS: BP 148/70  Pulse 59  Ht 4' 11.75" (1.518 m)  Wt 165 lb 8 oz (75.07 kg)  BMI 32.58 kg/m2  EXAM: GEN: Awake, nad HEENT: PERRLA, EOMI LUNGS: Clear bilaterally CV: RRR, s1/s2, no  MRG's ABD: S/NT, +BS EXT: No edema NEURO: A&O x 3, grossly non-focal SKIN: DRY, warm, no rashes PSYCH: Mood, affect normal  EKG: Sinus bradycardia at 59  ASSESSMENT: 1. Lung mass - possibly cancer 2. 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 Lower extremity edema 4. Hypertension  PLAN: 1.   Kimberly [name] continues to have episodes of syncope which sound to be more like classic vasodepressor syncope with a clear prodrome. He seemed to be related more with anxiety and unfortunately she has more reasons to be anxious as she was recently found to have a peribronchial mass after a couple of pneumonias. She is scheduled for a biopsy next week.  I am not recommending any medication changes his time in  all plan to see her back in 6 months after she is recovered from her procedures.  Pixie Casino, MD, [facility] Attending Cardiologist [facility] 10/27/2013, 6:52 PM

## 2013-10-27 NOTE — Pre-Procedure Instructions (Addendum)
Kimberly [name]  10/27/2013   Your procedure is scheduled on:  Thursday, October 30, 2013 at 8:00 AM.   Report to [facility] Entrance "A" Admitting Office at 545 AM.   Call this number if you have problems the morning of surgery: [phone]   Remember:   Do not eat food or drink liquids after midnight Wednesday, 10/29/13.   Take these medicines the morning of surgery with A SIP OF WATER: amLODipine (NORVASC), levothyroxine (SYNTHROID, LEVOTHROID), metoprolol succinate (TOPROL-XL), pantoprazole (PROTONIX), montelukast (SINGULAIR), budesonide-formoterol (SYMBICORT) inhaler.  You may also use your albuterol (PROAIR HFA) inhaler if needed and please bring with you day of surgery.        STOP all herbel meds, nsaids (aleve,naproxen,advil,ibuprofen) now including vitamins   Do not wear jewelry, make-up or nail polish.  Do not wear lotions, powders, or perfumes. You may wear deodorant.  Do not shave 48 hours prior to surgery.   Do not bring valuables to the hospital.  [facility] is not responsible                  for any belongings or valuables.               Contacts, dentures or bridgework may not be worn into surgery.  Leave suitcase in the car. After surgery it may be brought to your room.  For patients admitted to the hospital, discharge time is determined by your                treatment team.                 Special Instructions: [facility] - Preparing for Surgery  Before surgery, you can play an important role.  Because skin is not sterile, your skin needs to be as free of germs as possible.  You can reduce the number of germs on you skin by washing with CHG (chlorahexidine gluconate) soap before surgery.  CHG is an antiseptic cleaner which kills germs and bonds with the skin to continue killing germs even after washing.  Please DO NOT use if you have an allergy to CHG or antibacterial soaps.  If your skin becomes reddened/irritated stop using the CHG and inform your  nurse when you arrive at Short Stay.  Do not shave (including legs and underarms) for at least 48 hours prior to the first CHG shower.  You may shave your face.  Please follow these instructions carefully:   1.  Shower with CHG Soap the night before surgery and the                                morning of Surgery.  2.  If you choose to wash your hair, wash your hair first as usual with your       normal shampoo.  3.  After you shampoo, rinse your hair and body thoroughly to remove the                      Shampoo.  4.  Use CHG as you would any other liquid soap.  You can apply chg directly       to the skin and wash gently with scrungie or a clean washcloth.  5.  Apply the CHG Soap to your body ONLY FROM THE NECK DOWN.        Do not use on open wounds or  open sores.  Avoid contact with your eyes, ears, mouth and genitals (private parts).  Wash genitals (private parts) with your normal soap.  6.  Wash thoroughly, paying special attention to the area where your surgery        will be performed.  7.  Thoroughly rinse your body with warm water from the neck down.  8.  DO NOT shower/wash with your normal soap after using and rinsing off       the CHG Soap.  9.  Pat yourself dry with a clean towel.            10.  Wear clean pajamas.            11.  Place clean sheets on your bed the night of your first shower and do not        sleep with pets.  Day of Surgery  Do not apply any lotions/deodorants the morning of surgery.  Please wear clean clothes to the hospital/surgery center.     Please read over the following fact sheets that you were given: Pain Booklet, Coughing and Deep Breathing, Blood Transfusion Information, MRSA Information and Surgical Site Infection Prevention

## 2013-10-28 ENCOUNTER — Encounter (HOSPITAL_COMMUNITY)
Admission: RE | Admit: 2013-10-28 | Discharge: 2013-10-28 | Disposition: A | Discharge status: Home / Self Care | Payer: Medicare Other | Source: Ambulatory Visit | Attending: Thoracic Surgery (Cardiothoracic Vascular Surgery) | Admitting: Thoracic Surgery (Cardiothoracic Vascular Surgery)

## 2013-10-28 ENCOUNTER — Encounter (HOSPITAL_COMMUNITY): Payer: Self-pay

## 2013-10-28 VITALS — BP 124/86 | HR 89 | Temp 98.3°F | Resp 20 | Ht 59.75 in | Wt 164.9 lb

## 2013-10-28 DIAGNOSIS — R590 Localized enlarged lymph nodes: Secondary | ICD-10-CM

## 2013-10-28 DIAGNOSIS — R918 Other nonspecific abnormal finding of lung field: Secondary | ICD-10-CM

## 2013-10-28 HISTORY — DX: Hypothyroidism, unspecified: E03.9

## 2013-10-28 HISTORY — DX: Syncope and collapse: R55

## 2013-10-28 HISTORY — DX: Cough, unspecified: R05.9

## 2013-10-28 HISTORY — DX: Cough: R05

## 2013-10-28 HISTORY — DX: Other specified postprocedural states: R11.2

## 2013-10-28 HISTORY — DX: Cardiac murmur, unspecified: R01.1

## 2013-10-28 HISTORY — DX: Unspecified osteoarthritis, unspecified site: M19.90

## 2013-10-28 HISTORY — DX: Other specified postprocedural states: Z98.890

## 2013-10-28 HISTORY — DX: Pneumonia, unspecified organism: J18.9

## 2013-10-28 LAB — COMPREHENSIVE METABOLIC PANEL
ALT: 16 U/L (ref 0–35)
AST: 16 U/L (ref 0–37)
Albumin: 3.6 g/dL (ref 3.5–5.2)
Alkaline Phosphatase: 111 U/L (ref 39–117)
BUN: 22 mg/dL (ref 6–23)
CHLORIDE: 97 meq/L (ref 96–112)
CO2: 30 meq/L (ref 19–32)
Calcium: 10.2 mg/dL (ref 8.4–10.5)
Creatinine, Ser: 1.17 mg/dL — ABNORMAL HIGH (ref 0.50–1.10)
GFR calc Af Amer: 53 mL/min — ABNORMAL LOW (ref >90)
GFR, EST NON AFRICAN AMERICAN: 46 mL/min — AB (ref >90)
Glucose, Bld: 125 mg/dL — ABNORMAL HIGH (ref 70–99)
Potassium: 4.5 mEq/L (ref 3.7–5.3)
Sodium: 140 mEq/L (ref 137–147)
Total Bilirubin: 0.5 mg/dL (ref 0.3–1.2)
Total Protein: 7.3 g/dL (ref 6.0–8.3)

## 2013-10-28 LAB — TYPE AND SCREEN
ABO/RH(D): O POS
Antibody Screen: NEGATIVE

## 2013-10-28 LAB — PROTIME-INR
INR: 0.9 (ref 0.00–1.49)
PROTHROMBIN TIME: 12 s (ref 11.6–15.2)

## 2013-10-28 LAB — CBC
HEMATOCRIT: 34.9 % — AB (ref 36.0–46.0)
Hemoglobin: 11.9 g/dL — ABNORMAL LOW (ref 12.0–15.0)
MCH: 31 pg (ref 26.0–34.0)
MCHC: 34.1 g/dL (ref 30.0–36.0)
MCV: 90.9 fL (ref 78.0–100.0)
Platelets: 435 10*3/uL — ABNORMAL HIGH (ref 150–400)
RBC: 3.84 MIL/uL — AB (ref 3.87–5.11)
RDW: 13.3 % (ref 11.5–15.5)
WBC: 13.2 10*3/uL — AB (ref 4.0–10.5)

## 2013-10-28 LAB — APTT: aPTT: 29 seconds (ref 24–37)

## 2013-10-28 LAB — ABO/RH: ABO/RH(D): O POS

## 2013-10-29 MED ORDER — DEXTROSE 5 % IV SOLN
1.5000 g | INTRAVENOUS | Status: AC
  Administered 2013-10-30: 1.5 g via INTRAVENOUS
  Filled 2013-10-29: qty 1.5

## 2013-10-29 NOTE — Progress Notes (Addendum)
Anesthesia Chart Review:  Patient is a 73 year old female scheduled for video bronchoscopy with endobronchial ultrasound with possible mediastinoscopy on 10/30/13 by Dr. Roxan Hockey.  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 Bronchoscopy with biopsy showed only benign epithelial cells, however cytology showed a few atypical cells.  Other history includes former smoker, HTN, GERD, thyroidectomy with secondary hypothyroidism, murmur (mild AR 09/2012), syncope (Dr. Debara Pickett), cancer (not specified). PCP is Dr. Deland Pretty. Pulmonologist is Dr. Christinia Gully. Cardiologist is Dr. Lyman Bishop.  He saw her on 10/27/13.  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  He is aware of plans for biopsy.  He recommended six month follow-up without any medication changes.   Echocardiogram 09/25/2012  Study Conclusions - Left ventricle: The cavity size was normal. Wall thickness was increased in a pattern of mild LVH. There was mild concentric hypertrophy. Systolic function was normal. The estimated ejection fraction was in the range of 55% to 60%. Wall motion was normal; there were no regional wall motion abnormalities. Doppler parameters are consistent with abnormal left ventricular relaxation (grade 1 diastolic dysfunction). - Aortic valve: Trileaflet; mildly thickened, mildly calcified leaflets. Mild regurgitation. - Mitral valve: Mildly calcified annulus. - Right ventricle: Systolic pressure was increased. - Atrial septum: No defect or patent foramen ovale was identified. - Pulmonary arteries: PA peak pressure: 58m Hg (S). Impressions: The right ventricular systolic pressure was increased consistent with mild pulmonary hypertension.  Holter monitor worn 09/23/12-10/12/12 showed predominantly SR with PACs, one episode of five beats of  non-sustained VT.  According to Dr. HLysbeth Pennerrecent note and prior cardiology notes scanned under Media tab, [facility] and normal [facility] systolic function, and she had a low-risk MET-test on April 02, 2012, with a peak VO2 of 77% predicted without any evidence of an ischemic response. Based on these results, Dr. HDebara Pickettfelt it would be "quite unlikely that she has underlying coronary blockage that is responsible for her episode." [facility], but are pending. Unsure of [facility]--[facility]  EKG on 10/28/13 showed NSR, LAD, low voltage QRS, cannot rule out anteroseptal infarct (age undetermined)  Pulmonary function tests 10/06/13 FVC 1.95 (81%)  FEV1 1.47 (81%), 1.64 (91%) after bronchodilators  FEV1/FVC ratio 76%  DLCO 79%  Preoperative CXR and labs noted.  Anticipate that she can proceed as planned.  AGeorge HughMRiverwoods Surgery [facility] LLCShort Stay Center/Anesthesiology Phone [phone]/25/2015 12:06 PM

## 2013-10-30 ENCOUNTER — Encounter (HOSPITAL_COMMUNITY): Payer: Self-pay | Admitting: Certified Registered"

## 2013-10-30 ENCOUNTER — Encounter (HOSPITAL_COMMUNITY)
Admission: RE | Disposition: A | Discharge status: Home / Self Care | Payer: Self-pay | Source: Ambulatory Visit | Attending: Thoracic Surgery (Cardiothoracic Vascular Surgery)

## 2013-10-30 ENCOUNTER — Encounter (HOSPITAL_COMMUNITY): Payer: Medicare Other | Admitting: Vascular Surgery

## 2013-10-30 ENCOUNTER — Ambulatory Visit (HOSPITAL_COMMUNITY): Payer: Medicare Other | Admitting: Certified Registered Nurse Anesthetist

## 2013-10-30 ENCOUNTER — Ambulatory Visit (HOSPITAL_COMMUNITY)
Admission: RE | Admit: 2013-10-30 | Discharge: 2013-10-30 | Disposition: A | Discharge status: Home / Self Care | Payer: Medicare Other | Source: Ambulatory Visit | Attending: Thoracic Surgery (Cardiothoracic Vascular Surgery) | Admitting: Thoracic Surgery (Cardiothoracic Vascular Surgery)

## 2013-10-30 ENCOUNTER — Ambulatory Visit (HOSPITAL_COMMUNITY): Payer: Medicare Other

## 2013-10-30 DIAGNOSIS — R590 Localized enlarged lymph nodes: Secondary | ICD-10-CM

## 2013-10-30 DIAGNOSIS — C342 Malignant neoplasm of middle lobe, bronchus or lung: Secondary | ICD-10-CM | POA: Insufficient documentation

## 2013-10-30 DIAGNOSIS — I10 Essential (primary) hypertension: Secondary | ICD-10-CM | POA: Insufficient documentation

## 2013-10-30 DIAGNOSIS — M129 Arthropathy, unspecified: Secondary | ICD-10-CM | POA: Insufficient documentation

## 2013-10-30 DIAGNOSIS — I080 Rheumatic disorders of both mitral and aortic valves: Secondary | ICD-10-CM | POA: Insufficient documentation

## 2013-10-30 DIAGNOSIS — Z801 Family history of malignant neoplasm of trachea, bronchus and lung: Secondary | ICD-10-CM | POA: Insufficient documentation

## 2013-10-30 DIAGNOSIS — Z01810 Encounter for preprocedural cardiovascular examination: Secondary | ICD-10-CM | POA: Insufficient documentation

## 2013-10-30 DIAGNOSIS — J45909 Unspecified asthma, uncomplicated: Secondary | ICD-10-CM | POA: Insufficient documentation

## 2013-10-30 DIAGNOSIS — R222 Localized swelling, mass and lump, trunk: Secondary | ICD-10-CM

## 2013-10-30 DIAGNOSIS — Z01818 Encounter for other preprocedural examination: Secondary | ICD-10-CM | POA: Insufficient documentation

## 2013-10-30 DIAGNOSIS — K219 Gastro-esophageal reflux disease without esophagitis: Secondary | ICD-10-CM | POA: Insufficient documentation

## 2013-10-30 DIAGNOSIS — E890 Postprocedural hypothyroidism: Secondary | ICD-10-CM | POA: Insufficient documentation

## 2013-10-30 DIAGNOSIS — Z87891 Personal history of nicotine dependence: Secondary | ICD-10-CM | POA: Insufficient documentation

## 2013-10-30 DIAGNOSIS — Z01812 Encounter for preprocedural laboratory examination: Secondary | ICD-10-CM | POA: Insufficient documentation

## 2013-10-30 DIAGNOSIS — E785 Hyperlipidemia, unspecified: Secondary | ICD-10-CM | POA: Insufficient documentation

## 2013-10-30 DIAGNOSIS — R918 Other nonspecific abnormal finding of lung field: Secondary | ICD-10-CM

## 2013-10-30 HISTORY — PX: MEDIASTINOSCOPY: SHX5086

## 2013-10-30 HISTORY — PX: VIDEO BRONCHOSCOPY WITH ENDOBRONCHIAL ULTRASOUND: SHX6177

## 2013-10-30 SURGERY — BRONCHOSCOPY, WITH EBUS
Anesthesia: General

## 2013-10-30 MED ORDER — PROPOFOL 10 MG/ML IV BOLUS
INTRAVENOUS | Status: AC
  Filled 2013-10-30: qty 20

## 2013-10-30 MED ORDER — OXYCODONE HCL 5 MG PO TABS: 5.0000 mg | ORAL_TABLET | Freq: Four times a day (QID) | ORAL | Status: DC | PRN

## 2013-10-30 MED ORDER — ROCURONIUM BROMIDE 50 MG/5ML IV SOLN
INTRAVENOUS | Status: AC
  Filled 2013-10-30: qty 1

## 2013-10-30 MED ORDER — ONDANSETRON HCL 4 MG/2ML IJ SOLN
4.0000 mg | Freq: Four times a day (QID) | INTRAMUSCULAR | Status: DC | PRN
  Filled 2013-10-30: qty 2

## 2013-10-30 MED ORDER — FENTANYL CITRATE 0.05 MG/ML IJ SOLN
25.0000 ug | INTRAMUSCULAR | Status: DC | PRN
  Administered 2013-10-30 (×2): 25 ug via INTRAVENOUS

## 2013-10-30 MED ORDER — EPINEPHRINE HCL 1 MG/ML IJ SOLN
INTRAMUSCULAR | Status: AC
  Filled 2013-10-30: qty 1

## 2013-10-30 MED ORDER — ACETAMINOPHEN 650 MG RE SUPP
650.0000 mg | RECTAL | Status: DC | PRN
  Filled 2013-10-30: qty 1

## 2013-10-30 MED ORDER — PROMETHAZINE HCL 25 MG/ML IJ SOLN
6.2500 mg | INTRAMUSCULAR | Status: DC | PRN
  Administered 2013-10-30: 6.25 mg via INTRAVENOUS

## 2013-10-30 MED ORDER — PROPOFOL 10 MG/ML IV BOLUS
INTRAVENOUS | Status: DC | PRN
  Administered 2013-10-30: 130 mg via INTRAVENOUS

## 2013-10-30 MED ORDER — SUFENTANIL CITRATE 50 MCG/ML IV SOLN
INTRAVENOUS | Status: AC
  Filled 2013-10-30: qty 1

## 2013-10-30 MED ORDER — LIDOCAINE HCL (PF) 1 % IJ SOLN
INTRAMUSCULAR | Status: AC
  Filled 2013-10-30: qty 30

## 2013-10-30 MED ORDER — MIDAZOLAM HCL 5 MG/5ML IJ SOLN
INTRAMUSCULAR | Status: DC | PRN
  Administered 2013-10-30: 2 mg via INTRAVENOUS

## 2013-10-30 MED ORDER — SUFENTANIL CITRATE 50 MCG/ML IV SOLN
INTRAVENOUS | Status: DC | PRN
  Administered 2013-10-30: 15 ug via INTRAVENOUS
  Administered 2013-10-30: 10 ug via INTRAVENOUS
  Administered 2013-10-30: 5 ug via INTRAVENOUS

## 2013-10-30 MED ORDER — FENTANYL CITRATE 0.05 MG/ML IJ SOLN
INTRAMUSCULAR | Status: AC
  Filled 2013-10-30: qty 2

## 2013-10-30 MED ORDER — SODIUM CHLORIDE 0.9 % IV SOLN: 250.0000 mL | INTRAVENOUS | Status: DC | PRN

## 2013-10-30 MED ORDER — LIDOCAINE HCL (CARDIAC) 20 MG/ML IV SOLN
INTRAVENOUS | Status: AC
  Filled 2013-10-30: qty 5

## 2013-10-30 MED ORDER — SODIUM CHLORIDE 0.9 % IJ SOLN: 3.0000 mL | Freq: Two times a day (BID) | INTRAMUSCULAR | Status: DC

## 2013-10-30 MED ORDER — GLYCOPYRROLATE 0.2 MG/ML IJ SOLN
INTRAMUSCULAR | Status: DC | PRN
  Administered 2013-10-30: 0.4 mg via INTRAVENOUS

## 2013-10-30 MED ORDER — LACTATED RINGERS IV SOLN
INTRAVENOUS | Status: DC | PRN
  Administered 2013-10-30: 07:00:00 via INTRAVENOUS

## 2013-10-30 MED ORDER — NEOSTIGMINE METHYLSULFATE 1 MG/ML IJ SOLN
INTRAMUSCULAR | Status: DC | PRN
  Administered 2013-10-30: 3 mg via INTRAVENOUS

## 2013-10-30 MED ORDER — ROCURONIUM BROMIDE 100 MG/10ML IV SOLN
INTRAVENOUS | Status: DC | PRN
  Administered 2013-10-30: 10 mg via INTRAVENOUS
  Administered 2013-10-30: 50 mg via INTRAVENOUS
  Administered 2013-10-30: 10 mg via INTRAVENOUS

## 2013-10-30 MED ORDER — MIDAZOLAM HCL 2 MG/2ML IJ SOLN
INTRAMUSCULAR | Status: AC
  Filled 2013-10-30: qty 2

## 2013-10-30 MED ORDER — ACETAMINOPHEN 325 MG PO TABS
650.0000 mg | ORAL_TABLET | ORAL | Status: DC | PRN
  Filled 2013-10-30: qty 2

## 2013-10-30 MED ORDER — GLYCOPYRROLATE 0.2 MG/ML IJ SOLN
INTRAMUSCULAR | Status: AC
  Filled 2013-10-30: qty 2

## 2013-10-30 MED ORDER — SODIUM CHLORIDE 0.9 % IJ SOLN: 3.0000 mL | INTRAMUSCULAR | Status: DC | PRN

## 2013-10-30 MED ORDER — CHLORHEXIDINE GLUCONATE 4 % EX LIQD: 1.0000 "application " | Freq: Once | CUTANEOUS | Status: DC

## 2013-10-30 MED ORDER — SODIUM CHLORIDE 0.9 % IJ SOLN
INTRAMUSCULAR | Status: AC
  Filled 2013-10-30: qty 10

## 2013-10-30 MED ORDER — NEOSTIGMINE METHYLSULFATE 1 MG/ML IJ SOLN
INTRAMUSCULAR | Status: AC
Start: 2013-10-30 — End: 2013-10-30
  Filled 2013-10-30: qty 10

## 2013-10-30 MED ORDER — OXYCODONE HCL 5 MG PO TABS
5.0000 mg | ORAL_TABLET | Freq: Four times a day (QID) | ORAL | Status: DC | PRN
  Administered 2013-10-30: 10 mg via ORAL
  Filled 2013-10-30: qty 2

## 2013-10-30 MED ORDER — LIDOCAINE HCL (CARDIAC) 20 MG/ML IV SOLN
INTRAVENOUS | Status: DC | PRN
  Administered 2013-10-30: 50 mg via INTRAVENOUS

## 2013-10-30 MED ORDER — PROMETHAZINE HCL 25 MG/ML IJ SOLN
INTRAMUSCULAR | Status: AC
  Filled 2013-10-30: qty 1

## 2013-10-30 MED ORDER — 0.9 % SODIUM CHLORIDE (POUR BTL) OPTIME
TOPICAL | Status: DC | PRN
  Administered 2013-10-30: 2000 mL

## 2013-10-30 MED ORDER — ONDANSETRON HCL 4 MG/2ML IJ SOLN
INTRAMUSCULAR | Status: DC | PRN
  Administered 2013-10-30: 4 mg via INTRAVENOUS

## 2013-10-30 MED ORDER — ONDANSETRON HCL 4 MG/2ML IJ SOLN
INTRAMUSCULAR | Status: AC
  Filled 2013-10-30: qty 2

## 2013-10-30 SURGICAL SUPPLY — 61 items
APPLIER CLIP LOGIC TI 5 (MISCELLANEOUS) IMPLANT
BLADE SURG 15 STRL LF DISP TIS (BLADE) IMPLANT
BLADE SURG 15 STRL SS (BLADE)
BRUSH CYTOL CELLEBRITY 1.5X140 (MISCELLANEOUS) IMPLANT
CANISTER SUCTION 2500CC (MISCELLANEOUS) ×6 IMPLANT
CLIP TI MEDIUM 6 (CLIP) IMPLANT
CONT SPEC 4OZ CLIKSEAL STRL BL (MISCELLANEOUS) ×9 IMPLANT
COTTONBALL LRG STERILE PKG (GAUZE/BANDAGES/DRESSINGS) IMPLANT
COVER SURGICAL LIGHT HANDLE (MISCELLANEOUS) ×6 IMPLANT
COVER TABLE BACK 60X90 (DRAPES) ×3 IMPLANT
DERMABOND ADVANCED (GAUZE/BANDAGES/DRESSINGS) ×2
DERMABOND ADVANCED .7 DNX12 (GAUZE/BANDAGES/DRESSINGS) ×1 IMPLANT
DRAPE CHEST BREAST 15X10 FENES (DRAPES) ×3 IMPLANT
ELECT REM PT RETURN 9FT ADLT (ELECTROSURGICAL) ×3
ELECTRODE REM PT RTRN 9FT ADLT (ELECTROSURGICAL) ×1 IMPLANT
FILTER STRAW FLUID ASPIR (MISCELLANEOUS) IMPLANT
FORCEPS BIOP RJ4 1.8 (CUTTING FORCEPS) ×3 IMPLANT
GAUZE SPONGE 4X4 16PLY XRAY LF (GAUZE/BANDAGES/DRESSINGS) ×3 IMPLANT
GLOVE BIOGEL PI IND STRL 6.5 (GLOVE) ×1 IMPLANT
GLOVE BIOGEL PI INDICATOR 6.5 (GLOVE) ×2
GLOVE SURG SIGNA 7.5 PF LTX (GLOVE) ×6 IMPLANT
GOWN STRL REUS W/ TWL LRG LVL3 (GOWN DISPOSABLE) ×2 IMPLANT
GOWN STRL REUS W/ TWL XL LVL3 (GOWN DISPOSABLE) ×2 IMPLANT
GOWN STRL REUS W/TWL LRG LVL3 (GOWN DISPOSABLE) ×4
GOWN STRL REUS W/TWL XL LVL3 (GOWN DISPOSABLE) ×4
HEMOSTAT SURGICEL 2X14 (HEMOSTASIS) ×3 IMPLANT
KIT BASIN OR (CUSTOM PROCEDURE TRAY) ×3 IMPLANT
KIT ROOM TURNOVER OR (KITS) ×6 IMPLANT
MARKER SKIN DUAL TIP RULER LAB (MISCELLANEOUS) ×3 IMPLANT
NEEDLE 22X1 1/2 (OR ONLY) (NEEDLE) IMPLANT
NEEDLE BIOPSY TRANSBRONCH 21G (NEEDLE) IMPLANT
NEEDLE BLUNT 18X1 FOR OR ONLY (NEEDLE) IMPLANT
NEEDLE SYS SONOTIP II EBUSTBNA (NEEDLE) ×3 IMPLANT
NS IRRIG 1000ML POUR BTL (IV SOLUTION) ×6 IMPLANT
OIL SILICONE PENTAX (PARTS (SERVICE/REPAIRS)) IMPLANT
PACK SURGICAL SETUP 50X90 (CUSTOM PROCEDURE TRAY) ×3 IMPLANT
PAD ARMBOARD 7.5X6 YLW CONV (MISCELLANEOUS) ×12 IMPLANT
PENCIL BUTTON HOLSTER BLD 10FT (ELECTRODE) ×3 IMPLANT
SPONGE GAUZE 4X4 12PLY (GAUZE/BANDAGES/DRESSINGS) ×3 IMPLANT
SPONGE INTESTINAL PEANUT (DISPOSABLE) IMPLANT
SUT SILK 2 0 TIES 10X30 (SUTURE) IMPLANT
SUT VIC AB 2-0 CT1 27 (SUTURE)
SUT VIC AB 2-0 CT1 TAPERPNT 27 (SUTURE) IMPLANT
SUT VIC AB 3-0 SH 18 (SUTURE) IMPLANT
SUT VIC AB 3-0 SH 27 (SUTURE) ×2
SUT VIC AB 3-0 SH 27X BRD (SUTURE) ×1 IMPLANT
SUT VICRYL 4-0 PS2 18IN ABS (SUTURE) ×3 IMPLANT
SWAB COLLECTION DEVICE MRSA (MISCELLANEOUS) IMPLANT
SYR 20CC LL (SYRINGE) ×3 IMPLANT
SYR 20ML ECCENTRIC (SYRINGE) ×3 IMPLANT
SYR 5ML LL (SYRINGE) ×3 IMPLANT
SYR 5ML LUER SLIP (SYRINGE) ×3 IMPLANT
SYR CONTROL 10ML LL (SYRINGE) IMPLANT
SYRINGE 10CC LL (SYRINGE) ×3 IMPLANT
TOWEL OR 17X24 6PK STRL BLUE (TOWEL DISPOSABLE) ×6 IMPLANT
TOWEL OR 17X26 10 PK STRL BLUE (TOWEL DISPOSABLE) ×3 IMPLANT
TRAP SPECIMEN MUCOUS 40CC (MISCELLANEOUS) ×3 IMPLANT
TUBE ANAEROBIC SPECIMEN COL (MISCELLANEOUS) IMPLANT
TUBE CONNECTING 12'X1/4 (SUCTIONS) ×2
TUBE CONNECTING 12X1/4 (SUCTIONS) ×4 IMPLANT
WATER STERILE IRR 1000ML POUR (IV SOLUTION) ×3 IMPLANT

## 2013-10-30 NOTE — Brief Op Note (Signed)
10/30/2013  10:30 AM  PATIENT:  Kimberly [name]  73 y.o. female  PRE-OPERATIVE DIAGNOSIS:  RML LUNG MASS MEDIASTINAL ADENOPATHY  POST-OPERATIVE DIAGNOSIS:  RML LUNG MASS MEDIASTINAL ADENOPATHY  PROCEDURE:  Procedure(s): VIDEO BRONCHOSCOPY WITH ENDOBRONCHIAL ULTRASOUND (N/A) MEDIASTINOSCOPY (N/A)  SURGEON:  Surgeon(s) and Role:    * Melrose Nakayama, MD - Primary   ANESTHESIA:   general  EBL:  Total I/O In: 750 [I.V.:750] Out: -   BLOOD ADMINISTERED:none  DRAINS: none   LOCAL MEDICATIONS USED:  NONE  SPECIMEN:  Source of Specimen:  mediastinal lymph nodes, RML  DISPOSITION OF SPECIMEN:  PATHOLOGY  COUNTS:  YES  PLAN OF CARE: Discharge to home after PACU  PATIENT DISPOSITION:  PACU - hemodynamically stable.   Delay start of Pharmacological VTE agent (>24hrs) due to surgical blood loss or risk of bleeding: not applicable  Frozen of level 7 nodes- no tumor seen

## 2013-10-30 NOTE — Anesthesia Postprocedure Evaluation (Signed)
  Anesthesia Post-op Note  Patient: Kimberly [name]  Procedure(s) Performed: Procedure(s): VIDEO BRONCHOSCOPY WITH ENDOBRONCHIAL ULTRASOUND (N/A) MEDIASTINOSCOPY (N/A)  Patient Location: PACU  Anesthesia Type:General  Level of Consciousness: awake and alert   Airway and Oxygen Therapy: Patient Spontanous Breathing  Post-op Pain: mild  Post-op Assessment: Post-op Vital signs reviewed  Post-op Vital Signs: stable  Complications: No apparent anesthesia complications

## 2013-10-30 NOTE — Transfer of Care (Signed)
Immediate Anesthesia Transfer of Care Note  Patient: Kimberly [name]  Procedure(s) Performed: Procedure(s): VIDEO BRONCHOSCOPY WITH ENDOBRONCHIAL ULTRASOUND (N/A) MEDIASTINOSCOPY (N/A)  Patient Location: PACU  Anesthesia Type:General  Level of Consciousness: awake  Airway & Oxygen Therapy: Patient Spontanous Breathing and Patient connected to nasal cannula oxygen  Post-op Assessment: Report given to PACU RN, Post -op Vital signs reviewed and stable and Patient moving all extremities  Post vital signs: Reviewed and stable  Complications: No apparent anesthesia complications

## 2013-10-30 NOTE — Anesthesia Procedure Notes (Signed)
Procedure Name: Intubation Date/Time: 10/30/2013 7:49 AM Performed by: Melina Copa, Makari Sanko R Pre-anesthesia Checklist: Emergency Drugs available, Patient identified, Suction available, Patient being monitored and Timeout performed Patient Re-evaluated:Patient Re-evaluated prior to inductionOxygen Delivery Method: Circle system utilized Preoxygenation: Pre-oxygenation with 100% oxygen Intubation Type: IV induction Ventilation: Mask ventilation without difficulty Laryngoscope Size: Mac and 3 Grade View: Grade II Tube type: Oral Tube size: 9.0 mm Number of attempts: 3 Airway Equipment and Method: Stylet and Bougie stylet (Grade 2 view, ETT into esophagus, immediately recognized and ETT out, Mask Vent, Grade 2 View, unable to pass ETT through cords, Mask Vent, Grade 2 view, Blue intubating stylet through cords easily, ETT over Stylet.) Placement Confirmation: ETT inserted through vocal cords under direct vision,  positive ETCO2 and breath sounds checked- equal and bilateral Secured at: 21 cm Tube secured with: Tape Dental Injury: Teeth and Oropharynx as per pre-operative assessment

## 2013-10-30 NOTE — H&P (View-Only) (Signed)
PCP is [name], MD Referring Provider is Tanda Rockers, MD  Chief Complaint  Patient presents with  . Lung Mass    Surgical eval, PET Scan 10/15/13, Chest CT 09/19/13     HPI: 73 year old woman who presented with a chief complaint hemoptysis.  Kimberly [name] is a 73 year old woman with a remote history of tobacco use (quit in 1967) any family history of lung cancer in both her mother and maternal uncle. She had been evaluated for cough and shortness of breath back in 2010. No mass was found at that time. She has had a daily cough for about 3 years now. This usually is dry. In late November she developed a worsening cough associated with fever and below mucous. She was treated with antibiotics and had some improvement.   However on January 16 she had significant hemoptysis. She described this as "about a cupful." She went to the hospital. A CT of the chest was done which showed right middle lobe collapse with a possible mass. She saw Dr. Melvyn Novas and had a bronchoscopy. That showed extrinsic compression of the right middle lobe bronchus. Biopsy showed only benign epithelial cells, however cytology did show a few atypical cells. A PET CT was done on February 11 which showed the right middle lobe mass to have an SUV of 26. There was also an unexpected finding of a subcarinal lymph node with an SUV of 10.7. This node measured 1.1 cm in diameter.  She does have wheezing. She does get short of breath with exertion. If she walks up a flight of stairs she will be admitted and after rest. However she can walk for long distances on level ground without difficulty. She's lost 13 pounds in the last 3 months. She remains active is by herself. She is retired but does take care of all of her own needs.  ECOG/ZUBROD= 0     Past Medical History  Diagnosis Date  . Hypertension   . Thyroid disease   . GERD (gastroesophageal reflux disease)   . Cancer   . Dyslipidemia   . Aortic insufficiency   .  Mitral insufficiency   . History of nuclear stress test 02/26/2006    exercise myoview; normal pattern of perfusion; low risk scan     Past Surgical History  Procedure Laterality Date  . Thyroidectomy  1973  . Joint replacement  2003  . Dilation and curettage of uterus    . Carpal tunnel release  1988  . Rotator cuff repair  2006  . H/o met test w/pft  04/02/2012    low risk; peak VO2 77% predicted  . Cardiac catheterization  07/08/2008    normal coronaries  . Transthoracic echocardiogram  09/25/2012    EF 55-60%; mild LVH & mild concentric hypertrophy; mild AV regurg; RV systolic pressure increase consistent with mild pulm HTN  . Video bronchoscopy Bilateral 09/25/2013    Procedure: VIDEO BRONCHOSCOPY WITHOUT FLUORO;  Surgeon: Tanda Rockers, MD;  Location: [facility];  Service: Cardiopulmonary;  Laterality: Bilateral;    Family History  Problem Relation Age of Onset  . Lung cancer Mother   . Heart attack Father   . Heart failure Maternal Grandfather   . Heart attack Paternal Grandfather     Social History History  Substance Use Topics  . Smoking status: Former Smoker -- 0.50 packs/day for 6 years    Types: Cigarettes    Quit date: 09/05/1967  . Smokeless tobacco: Never Used  . Alcohol Use: Yes  Comment: occasional     Current Outpatient Prescriptions  Medication Sig Dispense Refill  . albuterol (PROAIR HFA) 108 (90 BASE) MCG/ACT inhaler Inhale 2 puffs into the lungs every 6 (six) hours as needed for wheezing or shortness of breath.      [name] amLODipine (NORVASC) 10 MG tablet Take 5 mg by mouth daily.       [name] atorvastatin (LIPITOR) 20 MG tablet Take 20 mg by mouth daily.      . budesonide-formoterol (SYMBICORT) 160-4.5 MCG/ACT inhaler Inhale 2 puffs into the lungs 2 (two) times daily.      . Cholecalciferol (VITAMIN D-3) 1000 UNITS CAPS Take by mouth daily.      [name] EPINEPHrine (EPIPEN IJ) Inject 1 each as directed once as needed (for reaction to milk products).      [name]  HYDROcodone-homatropine (HYCODAN) 5-1.5 MG/5ML syrup Take 5 mLs by mouth every 6 (six) hours as needed for cough.      . irbesartan-hydrochlorothiazide (AVALIDE) 300-12.5 MG per tablet Take 1 tablet by mouth daily.      [name] levothyroxine (SYNTHROID, LEVOTHROID) 125 MCG tablet Take 125 mcg by mouth daily before breakfast.      . metoprolol succinate (TOPROL-XL) 25 MG 24 hr tablet Take 12.5 mg by mouth daily.      . montelukast (SINGULAIR) 10 MG tablet Take 10 mg by mouth daily.      . Multiple Vitamins-Minerals (MULTIVITAMIN WITH MINERALS) tablet Take 1 tablet by mouth daily.      . pantoprazole (PROTONIX) 40 MG tablet Take 40 mg by mouth 2 (two) times daily.       . ranitidine (ZANTAC) 300 MG tablet Take 300 mg by mouth at bedtime.       No current facility-administered medications for this visit.    Allergies  Allergen Reactions  . Milk-Related Compounds Other (See Comments)    REACTION: GI upset, projectile vomiting    Review of Systems  Constitutional: Positive for fatigue.  Respiratory: Positive for cough (Including hemoptysis), shortness of breath (One flight of stairs) and wheezing. Negative for chest tightness.   Cardiovascular: Negative for chest pain.  Gastrointestinal:       Reflux  Musculoskeletal: Positive for arthralgias.  Neurological: Positive for syncope (most recently 2 weeks ago).  All other systems reviewed and are negative.    Resp 20  Ht 5' (1.524 m)  Wt 162 lb (73.483 kg)  BMI 31.64 kg/m2  SpO2 95% Physical Exam  Vitals reviewed. Constitutional: She is oriented to person, place, and time. No distress.  Obese  HENT:  Head: Normocephalic and atraumatic.  Eyes: EOM are normal. Pupils are equal, round, and reactive to light.  Neck: Normal range of motion. Neck supple. No thyromegaly present.  Cardiovascular: Normal rate and regular rhythm.   Murmur (2/6 diastolic) heard. Pulmonary/Chest: Effort normal. She has no wheezes. She has no rales.  Abdominal:  Soft. There is no tenderness.  Musculoskeletal: She exhibits no edema.  Lymphadenopathy:    She has no cervical adenopathy.  Neurological: She is alert and oriented to person, place, and time. No cranial nerve deficit.  No focal motor deficit  Skin: Skin is warm and dry.  Psychiatric: She has a normal mood and affect.     Diagnostic Tests: CT chest CT CHEST WITHOUT CONTRAST  TECHNIQUE:  Multidetector CT imaging of the chest was performed following the  standard protocol without IV contrast.  COMPARISON: Chest radiographs from earlier today. [facility]  medical Chest radiographs 09/03/2013. [facility] chest CTA 05/31/2011.  FINDINGS:  Radiographs demonstrate recent confluent right middle lobe versus  inferior upper lobe consolidation.  The trachea and proximal airways are patent. There is abrupt  termination of gas within the right middle lobe bronchus just after  it emerges from the bronchus intermedius (series 3, image 28). There  is then complete right middle lobe collapse, or consolidation but no  additional air bronchograms.  No pericardial or pleural effusion. No contrast administered. No  definite hilar or mediastinal lymphadenopathy.  No nodularity elsewhere in the right lung. Other Major airways are  patent. Left lung is stable with multiple left lower lobe lung  nodules, several densely calcified (series 3, image 40, 43)  compatible with chronic granulomas.  Chronic postoperative changes to the left thyroid. Chronic mid  thoracic disc and endplate degeneration. No acute or suspicious  osseous lesion identified.  Negative visualized non contrast liver, gallbladder, spleen,  pancreas, adrenal glands, kidneys common bowel.  IMPRESSION:  1. Complete drowning or collapse of the right middle lobe with  abrupt termination of the right middle lobe bronchus just beyond its  origin (series 3, image 28). Constellation of clinical and imaging  data highly  suspicious for an obstructing bronchial lesion.  Recommend bronchoscopy.  2. Otherwise negative right lung. No definite hilar or mediastinal  lymphadenopathy.  3. Otherwise stable appearance of the chest since 2012, including  right lower lobe granulomas.  Electronically Signed  By: Lee Hall M.D.  On: 09/19/2013 15:19  PET/CT NUCLEAR MEDICINE PET SKULL BASE TO THIGH  FASTING BLOOD GLUCOSE: Value: 89 mg/dl  TECHNIQUE:  9.3 mCi F-18 FDG was injected intravenously. Full-ring PET imaging  was performed from the skull base to thigh after the radiotracer. CT  data was obtained and used for attenuation correction and anatomic  localization.  COMPARISON: US BIOPSY dated 02/23/2009; CT CHEST W/O CM dated  09/19/2013  FINDINGS:  NECK  No hypermetabolic lymph nodes in the neck.  CHEST  There is mass extending from the right hilum into the right middle  lobe which constricts the right middle lobe bronchus results in  atelectasis. This mass measures approximately 5.5 by 3.3 cm and i s  intensely hypermetabolic with SUV max 26.0. There is a  hypermetabolic subcarinal lymph node measuring 10 mm short axis with  SUV max 10.7.  No additional hypermetabolic lymph nodes. There is a calcified non  hypermetabolic nodule at the left lung base. No hypermetabolic  supraclavicular adenopathy.  ABDOMEN/PELVIS  No abnormal hypermetabolic activity within the liver, pancreas,  adrenal glands, or spleen. No hypermetabolic lymph nodes in the  abdomen or pelvis.  SKELETON  No focal hypermetabolic activity to suggest skeletal metastasis.  IMPRESSION:  1. Hypermetabolic right middle lobe mass constricting the right  middle lobe bronchus.  2. Hypermetabolic subcarinal nodal metastasis.  3. Staging by FDG PET imaging T2 N2 M0  These results will be called to the ordering clinician or  representative by the Radiologist Assistant, and communication  documented in the PACS Dashboard.  Electronically Signed   By: Stewart Edmunds M.D.  On: 10/15/2013 15:16  Cytology Diagnosis BRONCHIAL LAVAGE, RML(SPECIMEN 1 OF 1 COLLECTED 09/25/13): RARE ATYPICAL CELLS PRESENT. PLEASE SEE COMMENT. H. CATHERINE LI MD Pathologist, Electronic Signature (Case signed 09/26/2013) Specimen Clinical Information hemoptysis, RML obst Source Bronchial Lavage, RML (specimen 1 of 1 collected 09/25/13) Gross Specimen: Received is/are 12cc's of mucoid material. (BS:bs) Prepared: # Smears: 2 # Concentration Technique Slides (i.e. ThinPrep): 0 #   Cell Block: 0 Additional Studies: n/a Comment There are two groups of markedly atypical glandular cells with prominent nucleoli without cilia. The findings are suspicious for malignancy. Clinical and radiographic correlation is highly recommended.  Pulmonary function tests FVC 1.95 (81%) FEV1 1.47 (81%), 1.64 (91%) after bronchodilators FEV1/FVC ratio 76% DLCO 79%  Echocardiogram 09/25/2012 Study Conclusions  - Left ventricle: The cavity size was normal. Wall thickness was increased in a pattern of mild LVH. There was mild concentric hypertrophy. Systolic function was normal. The estimated ejection fraction was in the range of 55% to 60%. Wall motion was normal; there were no regional wall motion abnormalities. Doppler parameters are consistent with abnormal left ventricular relaxation (grade 1 diastolic dysfunction). - Aortic valve: Trileaflet; mildly thickened, mildly calcified leaflets. Mild regurgitation. - Mitral valve: Mildly calcified annulus. - Right ventricle: Systolic pressure was increased. - Atrial septum: No defect or patent foramen ovale was identified. - Pulmonary arteries: PA peak pressure: 74m Hg (S). Impressions:  - The right ventricular systolic pressure was increased consistent with mild pulmonary hypertension. Transthoracic echocardiography. M-mode, complete 2D, spectral Doppler, and color Doppler. Height: Height: 149.9cm. Height: 59in.  Weight: Weight: 80.7kg. Weight: 177.6lb. Body mass index: BMI: 36kg/m^2. Body surface area: BSA: 1.730m. Blood pressure: 160/78. Patient status: Outpatient. Location: Echo laboratory.    Impression: Kimberly [name] a very nice [age]ear old woman with a very limited history of tobacco use, but a strong family history for lung cancer. She began having hemoptysis about a month ago. That led to a CT which showed collapse of the right middle lobe. Bronchoscopy did not show and endobronchial lesion, but rather showed extrinsic compression of the right middle lobe orifice. Biopsies were negative but BAL did show some atypical cells. A PET CT was done which showed the right middle lobe mass to have a very high SUV of 26. Unfortunately it also showed a subcarinal node with an SUV of 10. It is rather high particular given that it only measured 1.1 cm in diameter, and was unremarkable on CT.  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 There does however exist the possibility that this could all be infectious with a reactive adenopathy. However it needs to be lung cancer unless he could be proven otherwise.  Based on the appearance of the CT I would recommended proceeding to surgery. However given the findings on PET CT we need to clear the subcarinal node before putting her through a surgical resection. I recommended that we proceed with bronchoscopy, endobronchial ultrasound, and possible mediastinoscopy to re-sample the primary lesion as well as the subcarinal lymph node.  I described the proposed operation to her. She understands that we will do this under general anesthesia in the operating room. We will plan to do this on an outpatient basis. We will do endobronchial ultrasound first to sample the nodes. While we are waiting for pathology to look at those specimens, we will take additional biopsies of the right middle lobe mass. If the endobronchial ultrasound specimens are negative for tumor we  would then proceed with mediastinoscopy for definitive biopsy of the subcarinal nodes.  We discussed the indications, risks, benefits, and alternatives. She understands the risk include those associated with general anesthesia. She understands also that there are risks of bleeding, pneumothorax, failure to make a diagnosis, esophageal injury, recurrent nerve injury with hoarseness, and stroke. She accepts those risks and wishes to proceed.  I do not believe that she needs cardiology clearance prior to the endoscopic procedure. That  she would likely need cardiology clearance prior to any major thoracic resection. She is seeing Dr. Debara Pickett later this week, so we can get his input as to whether any further tests would be needed.  In the meantime we will plan to proceed with bronchoscopy, endobronchial ultrasound, possible mediastinal endoscopy on Thursday, February 26.

## 2013-10-30 NOTE — Interval H&P Note (Signed)
History and Physical Interval Note:  10/30/2013 7:27 AM  Kimberly [name]  has presented today for surgery, with the diagnosis of RML LUNG MASS MEDIASTINAL ADENOPATHY  The various methods of treatment have been discussed with the patient and family. After consideration of risks, benefits and other options for treatment, the patient has consented to  Procedure(s): VIDEO BRONCHOSCOPY WITH ENDOBRONCHIAL ULTRASOUND (N/A) MEDIASTINOSCOPY (N/A) as a surgical intervention .  The patient's history has been reviewed, patient examined, no change in status, stable for surgery.  I have reviewed the patient's chart and labs.  Questions were answered to the patient's satisfaction.     Deiondra Denley C

## 2013-10-30 NOTE — Discharge Instructions (Signed)
Do not drive or engage in heavy physical activity for 24 hours.   You have a prescription for oxycodone a narcotic pain reliever, you may use as directed.  You may use Tylenol or ibuprofen instead of, or in addition, to the oxycodone  After 24 hours you should not drive until you no longer need the pain medication.  You may cough up some blood over the next few days  There is a medical adhesive (Dermabond) on your incision. It will peel off in 7- 10 days  My office will contact you with a follow up appointment  You may shower tomorrow  What to eat:  For your first meals, you should eat lightly; only small meals initially.  If you do not have nausea, you may eat larger meals.  Avoid spicy, greasy and heavy food.    General Anesthesia, Adult, Care After  Refer to this sheet in the next few weeks. These instructions provide you with information on caring for yourself after your procedure. Your health care provider may also give you more specific instructions. Your treatment has been planned according to current medical practices, but problems sometimes occur. Call your health care provider if you have any problems or questions after your procedure.  WHAT TO EXPECT AFTER THE PROCEDURE  After the procedure, it is typical to experience:  Sleepiness.  Nausea and vomiting. HOME CARE INSTRUCTIONS  For the first 24 hours after general anesthesia:  Have a responsible person with you.  Do not drive a car. If you are alone, do not take public transportation.  Do not drink alcohol.  Do not take medicine that has not been prescribed by your health care provider.  Do not sign important papers or make important decisions.  You may resume a normal diet and activities as directed by your health care provider.  Change bandages (dressings) as directed.  If you have questions or problems that seem related to general anesthesia, call the hospital and ask for the anesthetist or anesthesiologist on  call. SEEK MEDICAL CARE IF:  You have nausea and vomiting that continue the day after anesthesia.  You develop a rash. SEEK IMMEDIATE MEDICAL CARE IF:  You have difficulty breathing.  You have chest pain.  You have any allergic problems. Document Released: 11/27/2000 Document Revised: 04/23/2013 Document Reviewed: 03/06/2013  [facility] Patient Information 2014 [place], [place].

## 2013-10-30 NOTE — Anesthesia Preprocedure Evaluation (Addendum)
Anesthesia Evaluation  Patient identified by MRN, date of birth, ID band Patient awake    Reviewed: Allergy & Precautions, H&P , NPO status , Patient's Chart, lab work & pertinent test results, reviewed documented beta blocker date and time   History of Anesthesia Complications (+) PONV  Airway Mallampati: II TM Distance: >3 FB Neck ROM: Full    Dental  (+) Teeth Intact, Dental Advisory Given   Pulmonary shortness of breath, asthma , former smoker,  + rhonchi         Cardiovascular Pt. on medications and Pt. on home beta blockers + Valvular Problems/Murmurs Rhythm:Regular Rate:Normal  Echo noted   Neuro/Psych    GI/Hepatic GERD-  Medicated and Controlled,  Endo/Other  Hypothyroidism   Renal/GU      Musculoskeletal  (+) Arthritis -,   Abdominal   Peds  Hematology   Anesthesia Other Findings   Reproductive/Obstetrics                          Anesthesia Physical Anesthesia Plan  ASA: III  Anesthesia Plan: General   Post-op Pain Management:    Induction: Intravenous  Airway Management Planned: Oral ETT  Additional Equipment: Arterial line  Intra-op Plan:   Post-operative Plan: Extubation in OR  Informed Consent: I have reviewed the patients History and Physical, chart, labs and discussed the procedure including the risks, benefits and alternatives for the proposed anesthesia with the patient or authorized representative who has indicated his/her understanding and acceptance.   Dental advisory given  Plan Discussed with: CRNA, Surgeon and Anesthesiologist  Anesthesia Plan Comments:        Anesthesia Quick Evaluation

## 2013-10-30 NOTE — Preoperative (Signed)
Beta Blockers   Reason not to administer Beta Blockers:Metoprolol taken at 0430 hrs on 10/30/2013

## 2013-10-31 ENCOUNTER — Encounter (HOSPITAL_COMMUNITY): Payer: Self-pay | Admitting: Thoracic Surgery (Cardiothoracic Vascular Surgery)

## 2013-10-31 NOTE — Op Note (Signed)
Kimberly [name], Kimberly [name]              ACCOUNT NO.:  [id number]  MEDICAL RECORD NO.:  [id number]  LOCATION:  [facility]                         FACILITY:  [facility]  PHYSICIAN:  Revonda Standard. Roxan Hockey, M.D.DATE OF BIRTH:  [birth year]  DATE OF PROCEDURE:  10/30/2013 DATE OF DISCHARGE:  10/30/2013                              OPERATIVE REPORT   PREOPERATIVE DIAGNOSIS:  Right middle lobe mass with mediastinal adenopathy.  POSTOPERATIVE DIAGNOSIS:  Right middle lobe mass with mediastinal adenopathy.  PROCEDURE:  Bronchoscopy with brushings and biopsies, endobronchial ultrasound with mediastinal lymph node aspiration, mediastinoscopy.  SURGEON:  Revonda Standard. Roxan Hockey, MD.  ASSISTANT:  None.  ANESTHESIA:  General.  FINDINGS:  Quick prep of mediastinal node aspirations.  No tumor seen. Frozen section of mediastinal node biopsies showed no tumor. An extrinsic compression at the orifice of the right middle lobe bronchus but no endobronchial tumor seen.  CLINICAL NOTE:  Kimberly [name] is a 73 year old woman who presented with hemoptysis.  Workup revealed a right middle lobe mass versus infiltrate. Bronchoscopy with biopsy showed atypical cells, but no definite tumor. A PET-CT showed hypermetabolism in this area.  It also showed hypermetabolic subcarinal lymph node.  The patient was advised to undergo a repeat bronchoscopy as well as endobronchial ultrasound and then mediastinoscopy for definitive evaluation of the mediastinal lymph nodes.  Indications, risks, benefits, and alternatives were discussed in detail with the patient.  She understood this was a diagnostic and not a therapeutic procedure.  She accepted the risks and agreed to proceed.  OPERATIVE NOTE:  Kimberly [name] was brought to the operating room on October 30, 2013.  There she had induction of general anesthesia. Flexible fiberoptic bronchoscopy was performed via the endotracheal tube.  The trachea, carina, and left bronchial tree were  normal.  The right main stem, right upper lobe, and the bronchus intermedius were normal until the takeoff of the middle and lower lobe bronchi.  There was edema in this area and extrinsic compression of the middle lobe bronchus.  No endobronchial tumor was seen.  Brushings and biopsies were taken. The bronchoscope was withdrawn.  The endobronchial ultrasound probe was placed and was advanced to the carina.  Two separate level 7 lymph nodes were identified.  Two aspirations were performed with each aspiration.  A needle was advanced into the lymph nodes with ultrasound visualization and 10 passes were performed with the needle with suction applied.  Next, a level 10 R node was identified and aspirated as well.  These aspirations were sent for quick prep as well as permanent cytology.  While awaiting those results, the endobronchial ultrasound scope was removed.  The bronchoscope was replaced.  Brushings and biopsies were obtained from the right middle lobe.  These were all sent for permanent pathology.  A quick prep on the mediastinal nodes returned showing no tumor and The decision was made to proceed with mediastinoscopy.  Intravenous antibiotics were administered by Anesthesia.  The neck and chest were prepped and draped in usual sterile fashion.  A transverse incision was made through the patient's previous collar incision. The dissection was carried down to the pretracheal fascia.  The pretracheal fascia was incised and a plane was developed bluntly  into the mediastinum.  There were no palpable lymph nodes.  The mediastinoscope was inserted.  It was noted right away that there was more bleeding than usual from the blunt dissection.  The scope was advanced to the subcarinal area, 3 separate nodes were seen.  Biopsies were taken from each node.  Samples from the first 2 were sent for frozen section.  A level 4L node was identified and also was biopsied. Hemostasis was achieved  with  minimal use of cautery at this point. Surgicel was packed into the subcarinal space.  The mediastinoscope was withdrawn and the wound was packed with gauze.  The frozen section on the subcarinal nodes returned showing no tumor.  The gauze was removed.  The mediastinoscope was reinserted.  Level 4R nodes were identified.  These were biopsied and sent for permanent section.  Final inspection was made for hemostasis and the mediastinoscope was withdrawn.  The incision was closed with interrupted 3-0 Vicryl to close the platysma, followed by 4-0 Vicryl subcuticular suture.  All sponge, needle, and instrument counts were correct at the end of the procedure.  The patient was extubated in the operating room, taken to the postanesthetic care unit in good condition.     Revonda Standard Roxan Hockey, M.D.     SCH/MEDQ  D:  10/30/2013  T:  10/31/2013  Job:  [id number]

## 2013-11-02 LAB — TISSUE CULTURE
CULTURE: NO GROWTH
Gram Stain: NONE SEEN

## 2013-11-03 LAB — CULTURE, RESPIRATORY: GRAM STAIN: NONE SEEN

## 2013-11-03 LAB — CULTURE, RESPIRATORY W GRAM STAIN

## 2013-11-04 ENCOUNTER — Ambulatory Visit (INDEPENDENT_AMBULATORY_CARE_PROVIDER_SITE_OTHER): Payer: Self-pay | Admitting: Thoracic Surgery (Cardiothoracic Vascular Surgery)

## 2013-11-04 ENCOUNTER — Encounter: Payer: Self-pay | Admitting: Thoracic Surgery (Cardiothoracic Vascular Surgery)

## 2013-11-04 VITALS — BP 170/90 | HR 97 | Resp 20 | Ht 59.75 in | Wt 164.0 lb

## 2013-11-04 DIAGNOSIS — Z9889 Other specified postprocedural states: Secondary | ICD-10-CM

## 2013-11-04 DIAGNOSIS — C801 Malignant (primary) neoplasm, unspecified: Secondary | ICD-10-CM

## 2013-11-04 DIAGNOSIS — IMO0002 Reserved for concepts with insufficient information to code with codable children: Secondary | ICD-10-CM

## 2013-11-04 NOTE — Progress Notes (Signed)
HPI:  Kimberly [name] returns today to discuss the pathology from her recent bronchoscopy mediastinoscopy.  Kimberly [name] is a 73 year old woman who presented with hemoptysis. Workup including a CT showed a right middle lobe atelectasis but no discrete mass a PET CT was done which showed hypermetabolism in the right middle lobe and also hypermetabolic subcarinal lymph nodes.  She had a bronchoscopy which was nondiagnostic.  Last week I took her to the operating room on Thursday for a bronchoscopy, endobronchial ultrasound and mediastinoscopy under general anesthesia. On EBUS there was no tumor seen. He did brushings and biopsies through the bronchoscope of the right middle lobe bronchus and then did a mediastinoscopy.  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 There was a questionable area on one of the subcarinal node aspirations with atypical cells but no definite tumor. However on pathology 3 level 7 nodes, 2 level 4R nodes and one level 4L node were all negative for tumor.  Past Medical History  Diagnosis Date  . Hypertension   . Thyroid disease   . GERD (gastroesophageal reflux disease)   . Cancer   . Dyslipidemia   . Aortic insufficiency   . Mitral insufficiency   . History of nuclear stress test 02/26/2006    exercise myoview; normal pattern of perfusion; low risk scan   . PONV (postoperative nausea and vomiting)   . Heart murmur   . Hypothyroidism   . Cough     dry, endobronchial mass  . Pneumonia     pus bronchitis  . Arthritis   . Syncope     "states she has passed out a few times. dr is trying to find cause.last time when geeting ready to go home after video bronch/bx      Current Outpatient Prescriptions  Medication Sig Dispense Refill  . albuterol (PROAIR HFA) 108 (90 BASE) MCG/ACT inhaler Inhale 2 puffs into the lungs every 6 (six) hours as needed for wheezing or shortness of breath.      [name] amLODipine (NORVASC) 10 MG tablet Take 5 mg by mouth  daily.       [name] atorvastatin (LIPITOR) 20 MG tablet Take 20 mg by mouth daily.      . budesonide-formoterol (SYMBICORT) 160-4.5 MCG/ACT inhaler Inhale 2 puffs into the lungs 2 (two) times daily.      . Cholecalciferol (VITAMIN D-3) 1000 UNITS CAPS Take by mouth daily.      [name] EPINEPHrine (EPIPEN IJ) Inject 1 each as directed once as needed (for reaction to milk products).      [name] HYDROcodone-homatropine (HYCODAN) 5-1.5 MG/5ML syrup Take 5 mLs by mouth every 6 (six) hours as needed for cough.      . irbesartan-hydrochlorothiazide (AVALIDE) 300-12.5 MG per tablet Take 1 tablet by mouth daily.      [name] levothyroxine (SYNTHROID, LEVOTHROID) 125 MCG tablet Take 125 mcg by mouth daily before breakfast.      . metoprolol succinate (TOPROL-XL) 25 MG 24 hr tablet Take 12.5 mg by mouth daily.      . montelukast (SINGULAIR) 10 MG tablet Take 10 mg by mouth daily.      . Multiple Vitamins-Minerals (MULTIVITAMIN WITH MINERALS) tablet Take 1 tablet by mouth daily.      [name] oxyCODONE (OXY IR/ROXICODONE) 5 MG immediate release tablet Take 1-2 tablets (5-10 mg total) by mouth 4 (four) times daily as needed for moderate pain.  30 tablet  0  . pantoprazole (PROTONIX) 40 MG tablet Take 40 mg by mouth  2 (two) times daily.       . ranitidine (ZANTAC) 300 MG tablet Take 300 mg by mouth at bedtime.       No current facility-administered medications for this visit.    Physical Exam BP 170/90  Pulse 97  Resp 20  Ht 4' 11.75" (1.518 m)  Wt 164 lb (74.39 kg)  BMI 32.28 kg/m2  SpO81 60% 73 year old woman in no acute distress Incision healing well Lungs with equal breath sounds bilaterally  Diagnostic Tests: Pathology FINAL DIAGNOSIS Diagnosis 1. Lymph node, biopsy, 7 #1 - ONE BENIGN LYMPH NODE WITH NO TUMOR SEEN (0/1). 2. Lymph node, biopsy, 7 #2 - ONE BENIGN LYMPH NODE WITH NO TUMOR SEEN (0/1). 3. Lung, biopsy, Right middle - POSITIVE FOR INVASIVE SQUAMOUS CELL CARCINOMA. - SEE COMMENT. 4. Lymph node, biopsy, 7  #3 - ONE BENIGN LYMPH NODE WITH NO TUMOR SEEN (0/1). 5. Lymph node, biopsy, 4 L - ONE BENIGN LYMPH NODE WITH NO TUMOR SEEN (0/1). 6. Lymph node, biopsy, 4R #1 - SCANT BENIGN LYMPHOID TISSUE WITH NO TUMOR SEEN. - ADJACENT BENIGN FIBROFATTY SOFT TISSUE. 7. Lymph node, biopsy, 4R #2 - ONE BENIGN LYMPH NODE WITH NO TUMOR SEEN (0/1). Microscopic Comment 3. Immunohistochemical stains are performed on the tumor in the right middle lung biopsy. The tumor is positive for p63, cytokeratin 5/6, and cytokeratin 7 while it is negative for TTF-1. The morphology coupled with the staining pattern is consistent with the above diagnosis. Dr. Saralyn Pilar has seen the right middle lobe lung biopsy in consultation with agreement. 1-7: The results are called to Dr. Roxan Hockey on 11/03/13. 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 She's had hemoptysis. I think given the findings of mediastinal endoscopy with all the nodes coming back negative that surgery is the best treatment option.  I had a long discussion with the patient and her daughter. We review the pathology results. We again reviewed her scans. I described the proposed operation to them in detail. We discussed doing a right thoracoscopic middle lobectomy, possible bilobectomy. We discussed that the final decision as to the extent of resection would be determined in the operating room. We will attempt to do this in a minimally invasive fashion. They understand the general nature of the operation including the need for general anesthesia, the incisions to be used, expected hospital stay, and overall recovery.  We discussed the indications, risks, benefits, and alternatives. They understand the risk include, but are not limited to death, MI, DVT, PE, bleeding, possible need for transfusion, infection, prolonged air  leak, cardiac arrhythmias, pleural effusions, as well as the possibility of unforeseeable, occasions. She accepts the risks and wishes to proceed as soon as possible.  Plan: Right VATS, right middle lobectomy, possible bilobectomy on Thursday, March 12.   She will be admitted on the day of surgery.

## 2013-11-05 ENCOUNTER — Other Ambulatory Visit: Payer: Self-pay | Admitting: *Deleted

## 2013-11-05 DIAGNOSIS — C3490 Malignant neoplasm of unspecified part of unspecified bronchus or lung: Secondary | ICD-10-CM

## 2013-11-11 ENCOUNTER — Ambulatory Visit (HOSPITAL_COMMUNITY)
Admission: RE | Admit: 2013-11-11 | Discharge: 2013-11-11 | Disposition: A | Discharge status: Home / Self Care | Payer: Medicare Other | Source: Ambulatory Visit | Attending: Thoracic Surgery (Cardiothoracic Vascular Surgery) | Admitting: Thoracic Surgery (Cardiothoracic Vascular Surgery)

## 2013-11-11 ENCOUNTER — Encounter (HOSPITAL_COMMUNITY)
Admission: RE | Admit: 2013-11-11 | Discharge: 2013-11-11 | Disposition: A | Discharge status: Home / Self Care | Payer: Medicare Other | Source: Ambulatory Visit | Attending: Thoracic Surgery (Cardiothoracic Vascular Surgery) | Admitting: Thoracic Surgery (Cardiothoracic Vascular Surgery)

## 2013-11-11 ENCOUNTER — Encounter (HOSPITAL_COMMUNITY): Payer: Self-pay

## 2013-11-11 VITALS — BP 131/73 | HR 72 | Temp 98.1°F | Resp 20 | Ht 59.0 in | Wt 162.5 lb

## 2013-11-11 DIAGNOSIS — C3490 Malignant neoplasm of unspecified part of unspecified bronchus or lung: Secondary | ICD-10-CM | POA: Insufficient documentation

## 2013-11-11 DIAGNOSIS — Q336 Congenital hypoplasia and dysplasia of lung: Secondary | ICD-10-CM

## 2013-11-11 DIAGNOSIS — Q333 Agenesis of lung: Secondary | ICD-10-CM | POA: Insufficient documentation

## 2013-11-11 HISTORY — DX: Shortness of breath: R06.02

## 2013-11-11 HISTORY — DX: Bronchitis, not specified as acute or chronic: J40

## 2013-11-11 LAB — URINALYSIS, ROUTINE W REFLEX MICROSCOPIC
BILIRUBIN URINE: NEGATIVE
Glucose, UA: NEGATIVE mg/dL
HGB URINE DIPSTICK: NEGATIVE
KETONES UR: NEGATIVE mg/dL
Leukocytes, UA: NEGATIVE
Nitrite: NEGATIVE
Protein, ur: NEGATIVE mg/dL
Specific Gravity, Urine: 1.017 (ref 1.005–1.030)
UROBILINOGEN UA: 0.2 mg/dL (ref 0.0–1.0)
pH: 7 (ref 5.0–8.0)

## 2013-11-11 LAB — COMPREHENSIVE METABOLIC PANEL
ALBUMIN: 3.5 g/dL (ref 3.5–5.2)
ALK PHOS: 115 U/L (ref 39–117)
ALT: 12 U/L (ref 0–35)
AST: 16 U/L (ref 0–37)
BUN: 27 mg/dL — ABNORMAL HIGH (ref 6–23)
CO2: 22 mEq/L (ref 19–32)
CREATININE: 1.04 mg/dL (ref 0.50–1.10)
Calcium: 10.1 mg/dL (ref 8.4–10.5)
Chloride: 95 mEq/L — ABNORMAL LOW (ref 96–112)
GFR calc Af Amer: 62 mL/min — ABNORMAL LOW (ref >90)
GFR calc non Af Amer: 53 mL/min — ABNORMAL LOW (ref >90)
Glucose, Bld: 121 mg/dL — ABNORMAL HIGH (ref 70–99)
POTASSIUM: 4.4 meq/L (ref 3.7–5.3)
Sodium: 134 mEq/L — ABNORMAL LOW (ref 137–147)
Total Bilirubin: 0.2 mg/dL — ABNORMAL LOW (ref 0.3–1.2)
Total Protein: 7.5 g/dL (ref 6.0–8.3)

## 2013-11-11 LAB — BLOOD GAS, ARTERIAL
ACID-BASE EXCESS: 2.7 mmol/L — AB (ref 0.0–2.0)
BICARBONATE: 25.8 meq/L — AB (ref 20.0–24.0)
Drawn by: 344381
FIO2: 0.21 %
O2 Saturation: 98.6 %
PCO2 ART: 33.2 mmHg — AB (ref 35.0–45.0)
PO2 ART: 102 mmHg — AB (ref 80.0–100.0)
Patient temperature: 98.6
TCO2: 26.8 mmol/L (ref 0–100)
pH, Arterial: 7.502 — ABNORMAL HIGH (ref 7.350–7.450)

## 2013-11-11 LAB — SURGICAL PCR SCREEN
MRSA, PCR: NEGATIVE
STAPHYLOCOCCUS AUREUS: NEGATIVE

## 2013-11-11 LAB — CBC
HCT: 33.6 % — ABNORMAL LOW (ref 36.0–46.0)
HEMOGLOBIN: 11.7 g/dL — AB (ref 12.0–15.0)
MCH: 31 pg (ref 26.0–34.0)
MCHC: 34.8 g/dL (ref 30.0–36.0)
MCV: 89.1 fL (ref 78.0–100.0)
Platelets: 529 10*3/uL — ABNORMAL HIGH (ref 150–400)
RBC: 3.77 MIL/uL — AB (ref 3.87–5.11)
RDW: 13.1 % (ref 11.5–15.5)
WBC: 13.1 10*3/uL — ABNORMAL HIGH (ref 4.0–10.5)

## 2013-11-11 LAB — PROTIME-INR
INR: 0.87 (ref 0.00–1.49)
PROTHROMBIN TIME: 11.7 s (ref 11.6–15.2)

## 2013-11-11 LAB — TYPE AND SCREEN
ABO/RH(D): O POS
Antibody Screen: NEGATIVE

## 2013-11-11 LAB — APTT: aPTT: 28 seconds (ref 24–37)

## 2013-11-11 NOTE — Pre-Procedure Instructions (Addendum)
Kimberly [name]  11/11/2013   Your procedure is scheduled on: Thursday, March 12.  Report to [facility], Main Entrance [name] "[facility]" at  AM.  Call this number if you have problems the morning of surgery: [phone]   Remember:   Do not eat food or drink liquids after midnight Wednesday.   Take these medicines the morning of surgery with A SIP OF WATER: amLODipine (NORVASC),levothyroxine (SYNTHROID, LEVOTHROID, metoprolol succinate (TOPROL-XL), pantoprazole (PROTONIX).    Use Albuterol Inhaler if needed and bring to the hospital with you.  Take if needed:traMADol (ULTRAM), montelukast (SINGULAIR).   Do not wear jewelry, make-up or nail polish.  Do not wear lotions, powders, or perfumes.   Men may shave face and neck.  Do not bring valuables to the hospital.  [facility] is not responsible for any belongings or valuables.               Contacts, dentures or bridgework may not be worn into surgery.  Leave suitcase in the car. After surgery it may be brought to your room.  For patients admitted to the hospital, discharge time is determined by your treatment team.             Special Instructions: Review  [facility] - Preparing For Surgery.   Please read over the following fact sheets that you were given: Pain Booklet, Coughing and Deep Breathing, Blood Transfusion Information and Surgical Site Infection Prevention

## 2013-11-12 MED ORDER — DEXTROSE 5 % IV SOLN
1.5000 g | INTRAVENOUS | Status: AC
  Administered 2013-11-13: 1.5 g via INTRAVENOUS
  Filled 2013-11-12: qty 1.5

## 2013-11-13 ENCOUNTER — Encounter (HOSPITAL_COMMUNITY)
Admission: RE | Disposition: A | Discharge status: Home / Self Care | Payer: Self-pay | Source: Ambulatory Visit | Attending: Thoracic Surgery (Cardiothoracic Vascular Surgery)

## 2013-11-13 ENCOUNTER — Inpatient Hospital Stay (HOSPITAL_COMMUNITY): Payer: Medicare Other

## 2013-11-13 ENCOUNTER — Inpatient Hospital Stay (HOSPITAL_COMMUNITY)
Admission: RE | Admit: 2013-11-13 | Discharge: 2013-11-16 | DRG: 167 | Disposition: A | Discharge status: Home / Self Care | Payer: Medicare Other | Source: Ambulatory Visit | Attending: Thoracic Surgery (Cardiothoracic Vascular Surgery) | Admitting: Thoracic Surgery (Cardiothoracic Vascular Surgery)

## 2013-11-13 ENCOUNTER — Inpatient Hospital Stay (HOSPITAL_COMMUNITY): Payer: Medicare Other | Admitting: Anesthesiology

## 2013-11-13 ENCOUNTER — Encounter (HOSPITAL_COMMUNITY): Payer: Self-pay | Admitting: *Deleted

## 2013-11-13 ENCOUNTER — Encounter (HOSPITAL_COMMUNITY): Payer: Medicare Other | Admitting: Anesthesiology

## 2013-11-13 DIAGNOSIS — I080 Rheumatic disorders of both mitral and aortic valves: Secondary | ICD-10-CM | POA: Diagnosis present

## 2013-11-13 DIAGNOSIS — C3490 Malignant neoplasm of unspecified part of unspecified bronchus or lung: Secondary | ICD-10-CM

## 2013-11-13 DIAGNOSIS — E039 Hypothyroidism, unspecified: Secondary | ICD-10-CM | POA: Diagnosis present

## 2013-11-13 DIAGNOSIS — E785 Hyperlipidemia, unspecified: Secondary | ICD-10-CM | POA: Diagnosis present

## 2013-11-13 DIAGNOSIS — M129 Arthropathy, unspecified: Secondary | ICD-10-CM | POA: Diagnosis present

## 2013-11-13 DIAGNOSIS — D62 Acute posthemorrhagic anemia: Secondary | ICD-10-CM | POA: Diagnosis not present

## 2013-11-13 DIAGNOSIS — C342 Malignant neoplasm of middle lobe, bronchus or lung: Secondary | ICD-10-CM | POA: Diagnosis present

## 2013-11-13 DIAGNOSIS — K219 Gastro-esophageal reflux disease without esophagitis: Secondary | ICD-10-CM | POA: Diagnosis present

## 2013-11-13 DIAGNOSIS — I10 Essential (primary) hypertension: Secondary | ICD-10-CM | POA: Diagnosis present

## 2013-11-13 DIAGNOSIS — D381 Neoplasm of uncertain behavior of trachea, bronchus and lung: Secondary | ICD-10-CM

## 2013-11-13 HISTORY — PX: VIDEO ASSISTED THORACOSCOPY (VATS)/ LOBECTOMY: SHX6169

## 2013-11-13 SURGERY — VIDEO ASSISTED THORACOSCOPY (VATS)/ LOBECTOMY
Anesthesia: General | Site: Chest | Laterality: Right

## 2013-11-13 MED ORDER — SODIUM CHLORIDE 0.9 % IJ SOLN
10.0000 mL | INTRAMUSCULAR | Status: DC | PRN
  Administered 2013-11-14 (×2): 10 mL

## 2013-11-13 MED ORDER — POTASSIUM CHLORIDE 10 MEQ/50ML IV SOLN: 10.0000 meq | Freq: Every day | INTRAVENOUS | Status: DC | PRN

## 2013-11-13 MED ORDER — PNEUMOCOCCAL VAC POLYVALENT 25 MCG/0.5ML IJ INJ: 0.5000 mL | INJECTION | INTRAMUSCULAR | Status: DC | PRN

## 2013-11-13 MED ORDER — DEXTROSE-NACL 5-0.9 % IV SOLN
INTRAVENOUS | Status: DC
  Administered 2013-11-13 – 2013-11-14 (×2): via INTRAVENOUS

## 2013-11-13 MED ORDER — PROPOFOL 10 MG/ML IV BOLUS
INTRAVENOUS | Status: AC
  Filled 2013-11-13: qty 20

## 2013-11-13 MED ORDER — SENNOSIDES-DOCUSATE SODIUM 8.6-50 MG PO TABS
1.0000 | ORAL_TABLET | Freq: Every evening | ORAL | Status: DC | PRN
  Filled 2013-11-13: qty 1

## 2013-11-13 MED ORDER — ROCURONIUM BROMIDE 100 MG/10ML IV SOLN
INTRAVENOUS | Status: DC | PRN
  Administered 2013-11-13: 20 mg via INTRAVENOUS
  Administered 2013-11-13: 40 mg via INTRAVENOUS

## 2013-11-13 MED ORDER — METOPROLOL SUCCINATE 12.5 MG HALF TABLET
12.5000 mg | ORAL_TABLET | Freq: Every day | ORAL | Status: DC
  Administered 2013-11-14 – 2013-11-16 (×3): 12.5 mg via ORAL
  Filled 2013-11-13 (×3): qty 1

## 2013-11-13 MED ORDER — OXYCODONE-ACETAMINOPHEN 5-325 MG PO TABS: 1.0000 | ORAL_TABLET | ORAL | Status: DC | PRN

## 2013-11-13 MED ORDER — FENTANYL CITRATE 0.05 MG/ML IJ SOLN
INTRAMUSCULAR | Status: DC | PRN
  Administered 2013-11-13 (×2): 50 ug via INTRAVENOUS
  Administered 2013-11-13: 100 ug via INTRAVENOUS

## 2013-11-13 MED ORDER — TRAMADOL HCL 50 MG PO TABS: 50.0000 mg | ORAL_TABLET | Freq: Four times a day (QID) | ORAL | Status: DC | PRN

## 2013-11-13 MED ORDER — LACTATED RINGERS IV SOLN
INTRAVENOUS | Status: DC
  Administered 2013-11-13: 09:00:00 via INTRAVENOUS

## 2013-11-13 MED ORDER — NEOSTIGMINE METHYLSULFATE 1 MG/ML IJ SOLN
INTRAMUSCULAR | Status: DC | PRN
  Administered 2013-11-13: 4 mg via INTRAVENOUS

## 2013-11-13 MED ORDER — AMLODIPINE BESYLATE 5 MG PO TABS
5.0000 mg | ORAL_TABLET | Freq: Every day | ORAL | Status: DC
  Administered 2013-11-14 – 2013-11-16 (×3): 5 mg via ORAL
  Filled 2013-11-13 (×3): qty 1

## 2013-11-13 MED ORDER — MIDAZOLAM HCL 2 MG/2ML IJ SOLN
INTRAMUSCULAR | Status: AC
  Filled 2013-11-13: qty 2

## 2013-11-13 MED ORDER — SODIUM CHLORIDE 0.9 % IV SOLN
10.0000 mg | INTRAVENOUS | Status: DC | PRN
  Administered 2013-11-13: 10 ug/min via INTRAVENOUS

## 2013-11-13 MED ORDER — ONDANSETRON HCL 4 MG/2ML IJ SOLN: 4.0000 mg | Freq: Four times a day (QID) | INTRAMUSCULAR | Status: DC | PRN

## 2013-11-13 MED ORDER — GLYCOPYRROLATE 0.2 MG/ML IJ SOLN
INTRAMUSCULAR | Status: DC | PRN
Start: 2013-11-13 — End: 2013-11-13
  Administered 2013-11-13: 0.2 mg via INTRAVENOUS
  Administered 2013-11-13: .8 mg via INTRAVENOUS

## 2013-11-13 MED ORDER — DIPHENHYDRAMINE HCL 12.5 MG/5ML PO ELIX
12.5000 mg | ORAL_SOLUTION | Freq: Four times a day (QID) | ORAL | Status: DC | PRN
  Filled 2013-11-13: qty 5

## 2013-11-13 MED ORDER — SODIUM CHLORIDE 0.9 % IJ SOLN
10.0000 mL | Freq: Two times a day (BID) | INTRAMUSCULAR | Status: DC
  Administered 2013-11-13 – 2013-11-14 (×2): 10 mL
  Administered 2013-11-15 – 2013-11-16 (×2): 20 mL

## 2013-11-13 MED ORDER — DIPHENHYDRAMINE HCL 50 MG/ML IJ SOLN: 12.5000 mg | Freq: Four times a day (QID) | INTRAMUSCULAR | Status: DC | PRN

## 2013-11-13 MED ORDER — ONDANSETRON HCL 4 MG/2ML IJ SOLN
INTRAMUSCULAR | Status: AC
  Filled 2013-11-13: qty 2

## 2013-11-13 MED ORDER — ROCURONIUM BROMIDE 50 MG/5ML IV SOLN
INTRAVENOUS | Status: AC
  Filled 2013-11-13: qty 1

## 2013-11-13 MED ORDER — ALBUTEROL SULFATE (2.5 MG/3ML) 0.083% IN NEBU: 3.0000 mL | INHALATION_SOLUTION | Freq: Four times a day (QID) | RESPIRATORY_TRACT | Status: DC | PRN

## 2013-11-13 MED ORDER — FAMOTIDINE 40 MG PO TABS
40.0000 mg | ORAL_TABLET | Freq: Every day | ORAL | Status: DC
  Administered 2013-11-13 – 2013-11-16 (×3): 40 mg via ORAL
  Filled 2013-11-13 (×4): qty 1

## 2013-11-13 MED ORDER — ONDANSETRON HCL 4 MG/2ML IJ SOLN
INTRAMUSCULAR | Status: DC | PRN
Start: 2013-11-13 — End: 2013-11-13
  Administered 2013-11-13: 4 mg via INTRAVENOUS

## 2013-11-13 MED ORDER — ACETAMINOPHEN 160 MG/5ML PO SOLN
1000.0000 mg | Freq: Four times a day (QID) | ORAL | Status: DC
  Filled 2013-11-13 (×4): qty 40

## 2013-11-13 MED ORDER — FENTANYL 10 MCG/ML IV SOLN
INTRAVENOUS | Status: DC
  Administered 2013-11-13: 16:00:00 via INTRAVENOUS
  Administered 2013-11-13: 80 ug via INTRAVENOUS
  Administered 2013-11-13: 50 ug via INTRAVENOUS
  Administered 2013-11-14: 80 ug via INTRAVENOUS
  Administered 2013-11-14: 150 ug via INTRAVENOUS
  Administered 2013-11-14: 100 ug via INTRAVENOUS
  Administered 2013-11-14: 90 ug via INTRAVENOUS
  Administered 2013-11-14: 190 ug via INTRAVENOUS
  Administered 2013-11-14: 08:00:00 via INTRAVENOUS
  Administered 2013-11-14: 100 ug via INTRAVENOUS
  Administered 2013-11-15: 30 ug via INTRAVENOUS
  Administered 2013-11-15: 50 ug via INTRAVENOUS
  Administered 2013-11-15: 40 ug via INTRAVENOUS
  Filled 2013-11-13 (×4): qty 50

## 2013-11-13 MED ORDER — OXYCODONE HCL 5 MG PO TABS: 5.0000 mg | ORAL_TABLET | ORAL | Status: DC | PRN

## 2013-11-13 MED ORDER — MONTELUKAST SODIUM 10 MG PO TABS
10.0000 mg | ORAL_TABLET | Freq: Every day | ORAL | Status: DC
  Administered 2013-11-14 – 2013-11-16 (×3): 10 mg via ORAL
  Filled 2013-11-13 (×3): qty 1

## 2013-11-13 MED ORDER — ACETAMINOPHEN 500 MG PO TABS
1000.0000 mg | ORAL_TABLET | Freq: Four times a day (QID) | ORAL | Status: DC
  Administered 2013-11-13 – 2013-11-14 (×3): 1000 mg via ORAL
  Filled 2013-11-13 (×4): qty 2

## 2013-11-13 MED ORDER — ATORVASTATIN CALCIUM 20 MG PO TABS
20.0000 mg | ORAL_TABLET | Freq: Every day | ORAL | Status: DC
  Administered 2013-11-14 – 2013-11-16 (×3): 20 mg via ORAL
  Filled 2013-11-13 (×3): qty 1

## 2013-11-13 MED ORDER — HYDROMORPHONE HCL PF 1 MG/ML IJ SOLN: 0.2500 mg | INTRAMUSCULAR | Status: DC | PRN

## 2013-11-13 MED ORDER — PROPOFOL 10 MG/ML IV BOLUS
INTRAVENOUS | Status: DC | PRN
  Administered 2013-11-13: 140 mg via INTRAVENOUS

## 2013-11-13 MED ORDER — LUNG SURGERY BOOK
Freq: Once | Status: DC
  Filled 2013-11-13: qty 1

## 2013-11-13 MED ORDER — HEMOSTATIC AGENTS (NO CHARGE) OPTIME
TOPICAL | Status: DC | PRN
  Administered 2013-11-13: 1 via TOPICAL

## 2013-11-13 MED ORDER — ONDANSETRON HCL 4 MG/2ML IJ SOLN: 4.0000 mg | Freq: Once | INTRAMUSCULAR | Status: DC | PRN

## 2013-11-13 MED ORDER — FENTANYL CITRATE 0.05 MG/ML IJ SOLN
INTRAMUSCULAR | Status: AC
  Administered 2013-11-13 (×2): 50 ug
  Filled 2013-11-13: qty 2

## 2013-11-13 MED ORDER — FENTANYL CITRATE 0.05 MG/ML IJ SOLN
INTRAMUSCULAR | Status: AC
  Administered 2013-11-13: 100 ug
  Filled 2013-11-13: qty 2

## 2013-11-13 MED ORDER — LIDOCAINE HCL (CARDIAC) 20 MG/ML IV SOLN
INTRAVENOUS | Status: AC
  Filled 2013-11-13: qty 5

## 2013-11-13 MED ORDER — DEXTROSE 5 % IV SOLN
1.5000 g | Freq: Two times a day (BID) | INTRAVENOUS | Status: AC
  Administered 2013-11-13 – 2013-11-14 (×2): 1.5 g via INTRAVENOUS
  Filled 2013-11-13 (×2): qty 1.5

## 2013-11-13 MED ORDER — NALOXONE HCL 0.4 MG/ML IJ SOLN: 0.4000 mg | INTRAMUSCULAR | Status: DC | PRN

## 2013-11-13 MED ORDER — LEVOTHYROXINE SODIUM 125 MCG PO TABS
125.0000 ug | ORAL_TABLET | Freq: Every day | ORAL | Status: DC
  Administered 2013-11-14 – 2013-11-16 (×3): 125 ug via ORAL
  Filled 2013-11-13 (×5): qty 1

## 2013-11-13 MED ORDER — FENTANYL CITRATE 0.05 MG/ML IJ SOLN
INTRAMUSCULAR | Status: AC
  Filled 2013-11-13: qty 5

## 2013-11-13 MED ORDER — MIDAZOLAM HCL 5 MG/5ML IJ SOLN
INTRAMUSCULAR | Status: DC | PRN
  Administered 2013-11-13: 1 mg via INTRAVENOUS

## 2013-11-13 MED ORDER — LIDOCAINE HCL 4 % MT SOLN
OROMUCOSAL | Status: DC | PRN
  Administered 2013-11-13: 4 mL via TOPICAL

## 2013-11-13 MED ORDER — PANTOPRAZOLE SODIUM 40 MG PO TBEC
40.0000 mg | DELAYED_RELEASE_TABLET | Freq: Two times a day (BID) | ORAL | Status: DC
  Administered 2013-11-13 – 2013-11-16 (×6): 40 mg via ORAL
  Filled 2013-11-13 (×6): qty 1

## 2013-11-13 MED ORDER — BISACODYL 5 MG PO TBEC
10.0000 mg | DELAYED_RELEASE_TABLET | Freq: Every day | ORAL | Status: DC
  Administered 2013-11-14 – 2013-11-16 (×3): 10 mg via ORAL
  Filled 2013-11-13 (×4): qty 2

## 2013-11-13 MED ORDER — HYDROMORPHONE HCL PF 1 MG/ML IJ SOLN
INTRAMUSCULAR | Status: AC
  Filled 2013-11-13: qty 1

## 2013-11-13 MED ORDER — SODIUM CHLORIDE 0.9 % IJ SOLN: 9.0000 mL | INTRAMUSCULAR | Status: DC | PRN

## 2013-11-13 MED ORDER — ONDANSETRON HCL 4 MG/2ML IJ SOLN
4.0000 mg | Freq: Four times a day (QID) | INTRAMUSCULAR | Status: DC | PRN
  Administered 2013-11-14: 4 mg via INTRAVENOUS
  Filled 2013-11-13: qty 2

## 2013-11-13 SURGICAL SUPPLY — 75 items
APPLIER CLIP ROT 10 11.4 M/L (STAPLE)
CANISTER SUCTION 2500CC (MISCELLANEOUS) ×4 IMPLANT
CATH KIT ON Q 5IN SLV (PAIN MANAGEMENT) IMPLANT
CATH THORACIC 28FR (CATHETERS) ×2 IMPLANT
CATH THORACIC 28FR RT ANG (CATHETERS) IMPLANT
CATH THORACIC 36FR (CATHETERS) IMPLANT
CATH THORACIC 36FR RT ANG (CATHETERS) IMPLANT
CLIP APPLIE ROT 10 11.4 M/L (STAPLE) IMPLANT
CLIP TI MEDIUM 6 (CLIP) ×2 IMPLANT
CONN ST 1/4X3/8  BEN (MISCELLANEOUS)
CONN ST 1/4X3/8 BEN (MISCELLANEOUS) IMPLANT
CONN Y 3/8X3/8X3/8  BEN (MISCELLANEOUS) ×1
CONN Y 3/8X3/8X3/8 BEN (MISCELLANEOUS) ×1 IMPLANT
CONT SPEC 4OZ CLIKSEAL STRL BL (MISCELLANEOUS) ×4 IMPLANT
COVER SURGICAL LIGHT HANDLE (MISCELLANEOUS) ×2 IMPLANT
DERMABOND ADHESIVE PROPEN (GAUZE/BANDAGES/DRESSINGS) ×1
DERMABOND ADVANCED (GAUZE/BANDAGES/DRESSINGS)
DERMABOND ADVANCED .7 DNX12 (GAUZE/BANDAGES/DRESSINGS) IMPLANT
DERMABOND ADVANCED .7 DNX6 (GAUZE/BANDAGES/DRESSINGS) ×1 IMPLANT
DRAPE LAPAROSCOPIC ABDOMINAL (DRAPES) ×2 IMPLANT
DRAPE WARM FLUID 44X44 (DRAPE) ×2 IMPLANT
ELECT REM PT RETURN 9FT ADLT (ELECTROSURGICAL) ×2
ELECTRODE REM PT RTRN 9FT ADLT (ELECTROSURGICAL) ×1 IMPLANT
GLOVE SURG SIGNA 7.5 PF LTX (GLOVE) ×4 IMPLANT
GOWN STRL REUS W/ TWL LRG LVL3 (GOWN DISPOSABLE) ×2 IMPLANT
GOWN STRL REUS W/ TWL XL LVL3 (GOWN DISPOSABLE) ×1 IMPLANT
GOWN STRL REUS W/TWL LRG LVL3 (GOWN DISPOSABLE) ×2
GOWN STRL REUS W/TWL XL LVL3 (GOWN DISPOSABLE) ×1
HANDLE STAPLE ENDO GIA SHORT (STAPLE) ×1
HEMOSTAT SURGICEL 2X14 (HEMOSTASIS) ×2 IMPLANT
KIT BASIN OR (CUSTOM PROCEDURE TRAY) ×2 IMPLANT
KIT ROOM TURNOVER OR (KITS) ×2 IMPLANT
KIT SUCTION CATH 14FR (SUCTIONS) ×2 IMPLANT
NEEDLE BIOPSY 14X6 SOFT TISS (NEEDLE) ×2 IMPLANT
NS IRRIG 1000ML POUR BTL (IV SOLUTION) ×4 IMPLANT
PACK CHEST (CUSTOM PROCEDURE TRAY) ×2 IMPLANT
PAD ARMBOARD 7.5X6 YLW CONV (MISCELLANEOUS) ×6 IMPLANT
POUCH ENDO CATCH II 15MM (MISCELLANEOUS) IMPLANT
POUCH SPECIMEN RETRIEVAL 10MM (ENDOMECHANICALS) IMPLANT
SEALANT PROGEL (MISCELLANEOUS) IMPLANT
SEALANT SURG COSEAL 4ML (VASCULAR PRODUCTS) IMPLANT
SEALANT SURG COSEAL 8ML (VASCULAR PRODUCTS) IMPLANT
SOLUTION ANTI FOG 6CC (MISCELLANEOUS) ×2 IMPLANT
SPECIMEN JAR MEDIUM (MISCELLANEOUS) ×2 IMPLANT
SPONGE GAUZE 4X4 12PLY (GAUZE/BANDAGES/DRESSINGS) ×2 IMPLANT
SPONGE GAUZE 4X4 12PLY STER LF (GAUZE/BANDAGES/DRESSINGS) ×2 IMPLANT
SPONGE INTESTINAL PEANUT (DISPOSABLE) IMPLANT
STAPLER ENDO GIA 12MM SHORT (STAPLE) ×1 IMPLANT
SUT PROLENE 4 0 RB 1 (SUTURE)
SUT PROLENE 4-0 RB1 .5 CRCL 36 (SUTURE) IMPLANT
SUT SILK  1 MH (SUTURE) ×2
SUT SILK 1 MH (SUTURE) ×2 IMPLANT
SUT SILK 2 0SH CR/8 30 (SUTURE) IMPLANT
SUT SILK 3 0SH CR/8 30 (SUTURE) IMPLANT
SUT VIC AB 1 CTX 36 (SUTURE) ×1
SUT VIC AB 1 CTX36XBRD ANBCTR (SUTURE) ×1 IMPLANT
SUT VIC AB 2-0 CTX 36 (SUTURE) ×2 IMPLANT
SUT VIC AB 2-0 UR6 27 (SUTURE) IMPLANT
SUT VIC AB 3-0 MH 27 (SUTURE) IMPLANT
SUT VIC AB 3-0 X1 27 (SUTURE) ×4 IMPLANT
SUT VICRYL 0 UR6 27IN ABS (SUTURE) ×2 IMPLANT
SUT VICRYL 2 TP 1 (SUTURE) IMPLANT
SWAB COLLECTION DEVICE MRSA (MISCELLANEOUS) IMPLANT
SYSTEM SAHARA CHEST DRAIN ATS (WOUND CARE) ×2 IMPLANT
TAPE CLOTH 4X10 WHT NS (GAUZE/BANDAGES/DRESSINGS) ×2 IMPLANT
TAPE CLOTH SURG 4X10 WHT LF (GAUZE/BANDAGES/DRESSINGS) ×2 IMPLANT
TIP APPLICATOR SPRAY EXTEND 16 (VASCULAR PRODUCTS) IMPLANT
TOWEL OR 17X24 6PK STRL BLUE (TOWEL DISPOSABLE) ×2 IMPLANT
TOWEL OR 17X26 10 PK STRL BLUE (TOWEL DISPOSABLE) ×2 IMPLANT
TRAP SPECIMEN MUCOUS 40CC (MISCELLANEOUS) IMPLANT
TRAY FOLEY CATH 16FRSI W/METER (SET/KITS/TRAYS/PACK) ×2 IMPLANT
TROCAR XCEL BLADELESS 5X75MML (TROCAR) ×2 IMPLANT
TUBE ANAEROBIC SPECIMEN COL (MISCELLANEOUS) IMPLANT
TUNNELER SHEATH ON-Q 11GX8 DSP (PAIN MANAGEMENT) IMPLANT
WATER STERILE IRR 1000ML POUR (IV SOLUTION) ×4 IMPLANT

## 2013-11-13 NOTE — Anesthesia Postprocedure Evaluation (Signed)
  Anesthesia Post-op Note  Patient: Kimberly [name]  Procedure(s) Performed: Procedure(s) with comments: VIDEO ASSISTED THORACOSCOPY (VATS) with mediastinal  biopsies (Right) - RIGHT VATS,mediastinal biopsies  Patient Location: PACU  Anesthesia Type:General  Level of Consciousness: awake, oriented, sedated and patient cooperative  Airway and Oxygen Therapy: Patient Spontanous Breathing  Post-op Pain: mild  Post-op Assessment: Post-op Vital signs reviewed, Patient's Cardiovascular Status Stable, Respiratory Function Stable, Patent Airway, No signs of Nausea or vomiting and Pain level controlled  Post-op Vital Signs: stable  Complications: No apparent anesthesia complications

## 2013-11-13 NOTE — Progress Notes (Signed)
Right VATS             

## 2013-11-13 NOTE — Interval H&P Note (Signed)
History and Physical Interval Note:  11/13/2013 12:25 PM  [name] Kimberly [name]  has presented today for surgery, with the diagnosis of SQUAMOUS CELL CARCINOMA-RML  The various methods of treatment have been discussed with the patient and family. After consideration of risks, benefits and other options for treatment, the patient has consented to  Procedure(s) with comments: [place] (VATS)/ LOBECTOMY (Right) - RIGHT VATS RML LOBECTOMY POSSIBLE BILOBECTOMY as a surgical intervention .  The patient's history has been reviewed, patient examined, no change in status, stable for surgery.  I have reviewed the patient's chart and labs.  Questions were answered to the patient's satisfaction.     Mylo Choi C

## 2013-11-13 NOTE — Transfer of Care (Signed)
Immediate Anesthesia Transfer of Care Note  Patient: Kimberly [name]  Procedure(s) Performed: Procedure(s) with comments: VIDEO ASSISTED THORACOSCOPY (VATS) with mediastinal  biopsies (Right) - RIGHT VATS,mediastinal biopsies  Patient Location: PACU  Anesthesia Type:General  Level of Consciousness: awake, alert  and oriented  Airway & Oxygen Therapy: Patient Spontanous Breathing and Patient connected to face mask oxygen  Post-op Assessment: Report given to PACU RN, Post -op Vital signs reviewed and stable and Patient moving all extremities X 4  Post vital signs: Reviewed and stable  Complications: No apparent anesthesia complications

## 2013-11-13 NOTE — Anesthesia Preprocedure Evaluation (Addendum)
Anesthesia Evaluation  Patient identified by MRN, date of birth, ID band Patient awake    Reviewed: Allergy & Precautions, H&P , NPO status , Patient's Chart, lab work & pertinent test results  Airway Mallampati: II      Dental  (+) Teeth Intact   Pulmonary asthma , former smoker,          Cardiovascular hypertension, Pt. on home beta blockers + Valvular Problems/Murmurs     Neuro/Psych    GI/Hepatic GERD-  ,  Endo/Other  Hypothyroidism   Renal/GU      Musculoskeletal   Abdominal   Peds  Hematology   Anesthesia Other Findings R Lung mass  Reproductive/Obstetrics                          Anesthesia Physical Anesthesia Plan  ASA: II  Anesthesia Plan: General   Post-op Pain Management:    Induction: Intravenous  Airway Management Planned: Double Lumen EBT  Additional Equipment: Arterial line and CVP  Intra-op Plan:   Post-operative Plan: Possible Post-op intubation/ventilation  Informed Consent: I have reviewed the patients History and Physical, chart, labs and discussed the procedure including the risks, benefits and alternatives for the proposed anesthesia with the patient or authorized representative who has indicated his/her understanding and acceptance.     Plan Discussed with:   Anesthesia Plan Comments:         Anesthesia Quick Evaluation

## 2013-11-13 NOTE — H&P (View-Only) (Signed)
HPI:  [name] returns today to discuss the pathology from her recent bronchoscopy mediastinoscopy.  [name] is a 73 year old woman who presented with hemoptysis. Workup including a CT showed a right middle lobe atelectasis but no discrete mass a PET CT was done which showed hypermetabolism in the right middle lobe and also hypermetabolic subcarinal lymph nodes.  She had a bronchoscopy which was nondiagnostic.  Last week I took her to the operating room on Thursday for a bronchoscopy, endobronchial ultrasound and mediastinoscopy under general anesthesia. On EBUS there was no tumor seen. He did brushings and biopsies through the bronchoscope of the right middle lobe bronchus and then did a mediastinoscopy.  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 There was a questionable area on one of the subcarinal node aspirations with atypical cells but no definite tumor. However on pathology 3 level 7 nodes, 2 level 4R nodes and one level 4L node were all negative for tumor.  Past Medical History  Diagnosis Date  . Hypertension   . Thyroid disease   . GERD (gastroesophageal reflux disease)   . Cancer   . Dyslipidemia   . Aortic insufficiency   . Mitral insufficiency   . History of nuclear stress test 02/26/2006    exercise myoview; normal pattern of perfusion; low risk scan   . PONV (postoperative nausea and vomiting)   . Heart murmur   . Hypothyroidism   . Cough     dry, endobronchial mass  . Pneumonia     pus bronchitis  . Arthritis   . Syncope     "states she has passed out a few times. dr is trying to find cause.last time when geeting ready to go home after video bronch/bx      Current Outpatient Prescriptions  Medication Sig Dispense Refill  . albuterol (PROAIR HFA) 108 (90 BASE) MCG/ACT inhaler Inhale 2 puffs into the lungs every 6 (six) hours as needed for wheezing or shortness of breath.      [name] amLODipine (NORVASC) 10 MG tablet Take 5 mg by mouth  daily.       [name] atorvastatin (LIPITOR) 20 MG tablet Take 20 mg by mouth daily.      . budesonide-formoterol (SYMBICORT) 160-4.5 MCG/ACT inhaler Inhale 2 puffs into the lungs 2 (two) times daily.      . Cholecalciferol (VITAMIN D-3) 1000 UNITS CAPS Take by mouth daily.      [name] EPINEPHrine (EPIPEN IJ) Inject 1 each as directed once as needed (for reaction to milk products).      [name] HYDROcodone-homatropine (HYCODAN) 5-1.5 MG/5ML syrup Take 5 mLs by mouth every 6 (six) hours as needed for cough.      . irbesartan-hydrochlorothiazide (AVALIDE) 300-12.5 MG per tablet Take 1 tablet by mouth daily.      [name] levothyroxine (SYNTHROID, LEVOTHROID) 125 MCG tablet Take 125 mcg by mouth daily before breakfast.      . metoprolol succinate (TOPROL-XL) 25 MG 24 hr tablet Take 12.5 mg by mouth daily.      . montelukast (SINGULAIR) 10 MG tablet Take 10 mg by mouth daily.      . Multiple Vitamins-Minerals (MULTIVITAMIN WITH MINERALS) tablet Take 1 tablet by mouth daily.      [name] oxyCODONE (OXY IR/ROXICODONE) 5 MG immediate release tablet Take 1-2 tablets (5-10 mg total) by mouth 4 (four) times daily as needed for moderate pain.  30 tablet  0  . pantoprazole (PROTONIX) 40 MG tablet Take 40 mg by mouth  2 (two) times daily.       . ranitidine (ZANTAC) 300 MG tablet Take 300 mg by mouth at bedtime.       No current facility-administered medications for this visit.    Physical Exam BP 170/90  Pulse 97  Resp 20  Ht 4' 11.75" (1.518 m)  Wt 164 lb (74.39 kg)  BMI 32.28 kg/m2  SpO70 30% 73 year old woman in no acute distress Incision healing well Lungs with equal breath sounds bilaterally  Diagnostic Tests: Pathology FINAL DIAGNOSIS Diagnosis 1. Lymph node, biopsy, 7 #1 - ONE BENIGN LYMPH NODE WITH NO TUMOR SEEN (0/1). 2. Lymph node, biopsy, 7 #2 - ONE BENIGN LYMPH NODE WITH NO TUMOR SEEN (0/1). 3. Lung, biopsy, Right middle - POSITIVE FOR INVASIVE SQUAMOUS CELL CARCINOMA. - SEE COMMENT. 4. Lymph node, biopsy, 7  #3 - ONE BENIGN LYMPH NODE WITH NO TUMOR SEEN (0/1). 5. Lymph node, biopsy, 4 L - ONE BENIGN LYMPH NODE WITH NO TUMOR SEEN (0/1). 6. Lymph node, biopsy, 4R #1 - SCANT BENIGN LYMPHOID TISSUE WITH NO TUMOR SEEN. - ADJACENT BENIGN FIBROFATTY SOFT TISSUE. 7. Lymph node, biopsy, 4R #2 - ONE BENIGN LYMPH NODE WITH NO TUMOR SEEN (0/1). Microscopic Comment 3. Immunohistochemical stains are performed on the tumor in the right middle lung biopsy. The tumor is positive for p63, cytokeratin 5/6, and cytokeratin 7 while it is negative for TTF-1. The morphology coupled with the staining pattern is consistent with the above diagnosis. Dr. Saralyn Pilar has seen the right middle lobe lung biopsy in consultation with agreement. 1-7: The results are called to Dr. Roxan Hockey on 11/03/13. 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 She's had hemoptysis. I think given the findings of mediastinal endoscopy with all the nodes coming back negative that surgery is the best treatment option.  I had a long discussion with the patient and her daughter. We review the pathology results. We again reviewed her scans. I described the proposed operation to them in detail. We discussed doing a right thoracoscopic middle lobectomy, possible bilobectomy. We discussed that the final decision as to the extent of resection would be determined in the operating room. We will attempt to do this in a minimally invasive fashion. They understand the general nature of the operation including the need for general anesthesia, the incisions to be used, expected hospital stay, and overall recovery.  We discussed the indications, risks, benefits, and alternatives. They understand the risk include, but are not limited to death, MI, DVT, PE, bleeding, possible need for transfusion, infection, prolonged air  leak, cardiac arrhythmias, pleural effusions, as well as the possibility of unforeseeable, occasions. She accepts the risks and wishes to proceed as soon as possible.  Plan: Right VATS, right middle lobectomy, possible bilobectomy on Thursday, March 12.   She will be admitted on the day of surgery.

## 2013-11-13 NOTE — Anesthesia Procedure Notes (Signed)
Procedure Name: Intubation Date/Time: 11/13/2013 12:41 PM Performed by: Rush Farmer E Pre-anesthesia Checklist: Patient identified, Emergency Drugs available, Suction available, Patient being monitored and Timeout performed Patient Re-evaluated:Patient Re-evaluated prior to inductionOxygen Delivery Method: Circle system utilized Preoxygenation: Pre-oxygenation with 100% oxygen Intubation Type: IV induction Ventilation: Mask ventilation without difficulty Laryngoscope Size: Mac and 3 Grade View: Grade I Tube type: Oral Endobronchial tube: Double lumen EBT, Left, EBT position confirmed by auscultation and EBT position confirmed by fiberoptic bronchoscope and 35 Fr Number of attempts: 1 Airway Equipment and Method: LTA kit utilized Placement Confirmation: positive ETCO2 and ETT inserted through vocal cords under direct vision Tube secured with: Tape Dental Injury: Teeth and Oropharynx as per pre-operative assessment

## 2013-11-13 NOTE — Brief Op Note (Addendum)
      [address]       [place] [address]             [phone]   11/13/2013  2:07 PM  PATIENT:  Kimberly [name]  73 y.o. female  PRE-OPERATIVE DIAGNOSIS:  SQUAMOUS CELL CARCINOMA-RIGHT LUNG MIDDLE LOBE  POST-OPERATIVE DIAGNOSIS:  SQUAMOUS CELL CARCINOMA-RIGHT LUNG MIDDLE LOBE  PROCEDURE:  Procedure(s): EXPLORATORY  VIDEO ASSISTED THORACOSCOPY (VATS) with mediastinal  biopsies  SURGEON:  Surgeon(s): Melrose Nakayama, MD  PHYSICIAN ASSISTANT: WAYNE GOLD PA-C  ANESTHESIA:   general  SPECIMEN:  Biopsy / Limited Resection  DISPOSITION OF SPECIMEN:  Pathology  DRAINS: 1 Chest Tube(s) in the RIGHT HEMITHORAX   PATIENT CONDITION:  PACU - hemodynamically stable.  PRE-OPERATIVE WEIGHT: 19LV  COMPLICATIONS: NO KNOWN  FINDINGS: UNRESECTABLE TUMOR DUE TO EXTENSION INTO MEDIASTINUM

## 2013-11-13 NOTE — Preoperative (Signed)
Beta Blockers   Reason not to administer Beta Blockers:BB taken at 6:30 am 11/13/13

## 2013-11-14 ENCOUNTER — Inpatient Hospital Stay (HOSPITAL_COMMUNITY): Payer: Medicare Other

## 2013-11-14 LAB — BLOOD GAS, ARTERIAL
ACID-BASE EXCESS: 2.1 mmol/L — AB (ref 0.0–2.0)
Bicarbonate: 26.4 mEq/L — ABNORMAL HIGH (ref 20.0–24.0)
Drawn by: 347641
O2 Content: 1 L/min
O2 Saturation: 95.1 %
PCO2 ART: 42.4 mmHg (ref 35.0–45.0)
PO2 ART: 74.9 mmHg — AB (ref 80.0–100.0)
Patient temperature: 98.6
TCO2: 27.7 mmol/L (ref 0–100)
pH, Arterial: 7.41 (ref 7.350–7.450)

## 2013-11-14 LAB — BASIC METABOLIC PANEL
BUN: 19 mg/dL (ref 6–23)
CO2: 25 meq/L (ref 19–32)
Calcium: 9.2 mg/dL (ref 8.4–10.5)
Chloride: 95 mEq/L — ABNORMAL LOW (ref 96–112)
Creatinine, Ser: 0.86 mg/dL (ref 0.50–1.10)
GFR calc Af Amer: 78 mL/min — ABNORMAL LOW (ref >90)
GFR calc non Af Amer: 67 mL/min — ABNORMAL LOW (ref >90)
GLUCOSE: 141 mg/dL — AB (ref 70–99)
POTASSIUM: 4.4 meq/L (ref 3.7–5.3)
SODIUM: 134 meq/L — AB (ref 137–147)

## 2013-11-14 LAB — CBC
HCT: 28.1 % — ABNORMAL LOW (ref 36.0–46.0)
HEMOGLOBIN: 9.6 g/dL — AB (ref 12.0–15.0)
MCH: 30.7 pg (ref 26.0–34.0)
MCHC: 34.2 g/dL (ref 30.0–36.0)
MCV: 89.8 fL (ref 78.0–100.0)
Platelets: 445 10*3/uL — ABNORMAL HIGH (ref 150–400)
RBC: 3.13 MIL/uL — AB (ref 3.87–5.11)
RDW: 13 % (ref 11.5–15.5)
WBC: 13.4 10*3/uL — ABNORMAL HIGH (ref 4.0–10.5)

## 2013-11-14 MED ORDER — OXYCODONE HCL 5 MG PO TABS
5.0000 mg | ORAL_TABLET | ORAL | Status: DC | PRN
  Administered 2013-11-15 – 2013-11-16 (×3): 5 mg via ORAL
  Filled 2013-11-14 (×2): qty 1
  Filled 2013-11-14: qty 2

## 2013-11-14 MED ORDER — ENOXAPARIN SODIUM 40 MG/0.4ML ~~LOC~~ SOLN
40.0000 mg | SUBCUTANEOUS | Status: DC
  Administered 2013-11-14 – 2013-11-16 (×3): 40 mg via SUBCUTANEOUS
  Filled 2013-11-14 (×4): qty 0.4

## 2013-11-14 MED ORDER — ACETAMINOPHEN 500 MG PO TABS
1000.0000 mg | ORAL_TABLET | Freq: Three times a day (TID) | ORAL | Status: DC
  Administered 2013-11-14 – 2013-11-16 (×7): 1000 mg via ORAL
  Filled 2013-11-14 (×11): qty 2

## 2013-11-14 NOTE — Progress Notes (Signed)
11/14/2013 5:54 PM Spoke with Dr. Prescott Gum about patient receiving more than max dose of Tylenol in 24 hours due to the frequency of medication being changed. He stated that it was okay to go ahead and give patient Tylenol to help keep her comfortable.  Tilda Burrow Everhart

## 2013-11-14 NOTE — Discharge Instructions (Signed)
Thoracoscopy Care After Refer to this sheet in the next few weeks. These discharge instructions provide you with general information on caring for yourself after you leave the hospital. Your caregiver may also give you specific instructions. Your treatment has been planned according to the most current medical practices available, but unavoidable complications sometimes occur. If you have any problems or questions after discharge, call your caregiver. HOME CARE INSTRUCTIONS   Remove the bandage (dressing) over your chest tube site as directed by your caregiver.  It is normal to be sore for a couple weeks following surgery. See your caregiver if this seems to be getting worse rather than better.  Only take over-the-counter or prescription medicines for pain, discomfort, or fever as directed by your caregiver. It is very important to take pain medicine when you need it so that you will cough and breathe deeply enough to clear mucus (phlegm) and expand your lungs.  If it hurts to cough, hold a pillow against your chest when you cough. This may help with the discomfort. In spite of the discomfort, cough frequently, as this helps protect against getting an infection in your lung (pneumonia).  Taking deep breaths keeps lungs inflated and protects against pneumonia. Most patients will go home with an incentive spirometer that encourages deep breathing.  You may resume a normal diet and activities as directed.  Use showers for bathing until you see your caregiver, or as instructed.  Change dressings if necessary or as directed.  Avoid lifting or driving until you are instructed otherwise.  Make an appointment to see your caregiver for stitch (suture) or staple removal when instructed.  Do not travel by airplane for 2 weeks after the chest tube is removed. SEEK MEDICAL CARE IF:   You are bleeding from your wounds.  You have redness, swelling, or increasing pain in the wounds.  Your heartbeat  feels irregular or very fast.  There is pus coming from your wounds.  There is a bad smell coming from the wound or dressing. SEEK IMMEDIATE MEDICAL CARE IF:   You have a fever.  You develop a rash.  You have difficulty breathing.  You develop any reaction or side effects to medicines given.  You develop lightheadedness or feel faint.  You develop shortness of breath or chest pain. MAKE SURE YOU:   Understand these instructions.  Will watch your condition.  Will get help right away if you are not doing well or get worse. Document Released: 03/10/2005 Document Revised: 11/13/2011 Document Reviewed: 02/08/2011 [facility] Patient Information 2014 [place], [place].

## 2013-11-14 NOTE — Care Management Note (Unsigned)
    Page 1 of 1   11/14/2013     4:09:50 PM   CARE MANAGEMENT NOTE 11/14/2013  Patient:  Kimberly [name], Kimberly [name]   Account Number:  [id number]  Date Initiated:  11/14/2013  Documentation initiated by:  GRAVES-BIGELOW,Vlad Mayberry  Subjective/Objective Assessment:   Pt admitted for Squamous cell carcinoma. S/p (VATS) with mediastinal  biopsies (Right).     Action/Plan:   CM will continue to monitor for disposition needs.   Anticipated DC Date:  11/17/2013   Anticipated DC Plan:  [facility]  CM consult      Choice offered to / List presented to:             Status of service:  In process, will continue to follow Medicare Important Message given?   (If response is "NO", the following Medicare IM given date fields will be blank) Date Medicare IM given:   Date Additional Medicare IM given:    Discharge Disposition:    Per UR Regulation:  Reviewed for med. necessity/level of care/duration of stay  If discussed at [place] of Stay Meetings, dates discussed:    Comments:

## 2013-11-14 NOTE — Discharge Summary (Signed)
[address]       [place] [address]             [place] Oct 11, 1940 73 y.o. 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 female who presented with hemoptysis. She has had extensive workup including CT scan which showed right middle lobe atelectasis but no discrete mass, a PET scan was done which showed a hypermetabolism in the right middle lobe and also hypermetabolic subcarinal lymph nodes. She underwent a bronchoscopy and mediastinoscopy which revealed squamous cell carcinoma. She was admitted this hospitalization for resection. Past Medical History   Diagnosis  Date   .  Hypertension    .  Thyroid disease    .  GERD (gastroesophageal reflux disease)    .  Cancer    .  Dyslipidemia    .  Aortic insufficiency    .  Mitral insufficiency    .  History of nuclear stress test  02/26/2006     exercise myoview; normal pattern of perfusion; low risk scan   .  PONV (postoperative nausea and vomiting)    .  Heart murmur    .  Hypothyroidism    .  Cough      dry, endobronchial mass   .  Pneumonia      pus bronchitis   .  Arthritis    .  Syncope      "states she has passed out a few times. dr is trying to find cause.last time when geeting ready to go home after video bronch/bx    Current Outpatient Prescriptions   Medication  Sig  Dispense  Refill   .  albuterol (PROAIR HFA) 108 (90 BASE) MCG/ACT inhaler  Inhale 2 puffs into the lungs every 6 (six) hours as needed for wheezing or shortness of breath.     [name]  amLODipine (NORVASC) 10 MG tablet  Take 5 mg by mouth daily.     [name]  atorvastatin (LIPITOR) 20 MG tablet  Take 20 mg by mouth daily.     .  budesonide-formoterol (SYMBICORT) 160-4.5 MCG/ACT inhaler  Inhale 2 puffs into the lungs 2 (two) times daily.     .  Cholecalciferol  (VITAMIN D-3) 1000 UNITS CAPS  Take by mouth daily.     [name]  EPINEPHrine (EPIPEN IJ)  Inject 1 each as directed once as needed (for reaction to milk products).     [name]  HYDROcodone-homatropine (HYCODAN) 5-1.5 MG/5ML syrup  Take 5 mLs by mouth every 6 (six) hours as needed for cough.     .  irbesartan-hydrochlorothiazide (AVALIDE) 300-12.5 MG per tablet  Take 1 tablet by mouth daily.     [name]  levothyroxine (SYNTHROID, LEVOTHROID) 125 MCG tablet  Take 125 mcg by mouth daily before breakfast.     .  metoprolol succinate (TOPROL-XL) 25 MG 24 hr tablet  Take 12.5 mg by mouth daily.     .  montelukast (SINGULAIR) 10 MG tablet  Take 10 mg by mouth daily.     .  Multiple Vitamins-Minerals (MULTIVITAMIN WITH MINERALS) tablet  Take 1 tablet by mouth daily.     [name]  oxyCODONE (OXY IR/ROXICODONE) 5 MG immediate release tablet  Take 1-2 tablets (5-10 mg total) by mouth 4 (four) times daily  as needed for moderate pain.  30 tablet  0   .  pantoprazole (PROTONIX) 40 MG tablet  Take 40 mg by mouth 2 (two) times daily.     .  ranitidine (ZANTAC) 300 MG tablet  Take 300 mg by mouth at bedtime.        Hospital Course:  The patient was admitted to the hospital and taken to the operating room on 11/13/2013 and underwent Procedure(s): DATE OF PROCEDURE: 11/13/2013  DATE OF DISCHARGE:  OPERATIVE REPORT  PREOPERATIVE DIAGNOSIS: Squamous cell carcinoma, right middle lobe,  clinical T2, N0.  POSTOPERATIVE DIAGNOSIS: Squamous cell carcinoma, right lower lobe,  clinical T4, N0.  PROCEDURE: Exploratory right video-assisted thoracoscopy, mediastinal  biopsy.  SURGEON: Revonda Standard. Roxan Hockey, M.D.  FIRST ASSISTANCE: John Giovanni, P.A.-C.  ANESTHESIA: General.  FINDINGS: Initially, small nodule seen on pericardium. Frozen section  of those revealed no tumor, but chronic inflammation. However,  dissection progressed, there was definite invasion into the pericardium,  unable to establish a plane of dissection, tumor was  unresectable The patient was taken from the  operating room to the postanesthetic care unit in good condition   Postoperative hospital course:  The patient has remained stable. All routine lines, monitors and drainage devices have been discontinued in the standard fashion. She is tolerating gradually increasing activities using standard postoperative protocols. Incisions are healing well without evidence of infection. She is tolerating diet. Oxygen has been weaned and he maintains adequate saturations on room air. She has a mild expected acute blood loss anemia which is stable. Her overall status is felt to be tentatively stable for discharge in the next 24-48 hours pending ongoing reevaluation of her recovery. She will be referred to [facility] for chemoradiation.      Recent Labs  11/11/13 1543 11/14/13 0355  NA 134* 134*  K 4.4 4.4  CL 95* 95*  CO2 22 25  GLUCOSE 121* 141*  BUN 27* 19  CALCIUM 10.1 9.2    Recent Labs  11/11/13 1543 11/14/13 0355  WBC 13.1* 13.4*  HGB 11.7* 9.6*  HCT 33.6* 28.1*  PLT 529* 445*    Recent Labs  11/11/13 1543  INR 0.87     Discharge Instructions:  The patient is discharged to home with extensive instructions on wound care and progressive ambulation.  They are instructed not to drive or perform any heavy lifting until returning to see the physician in his office.  Discharge Diagnosis:  SQUAMOUS CELL CARCINOMA-RML  Secondary Diagnosis: Patient Active Problem List   Diagnosis Date Noted  . Lung cancer, middle lobe 11/13/2013  . Hemoptysis 09/19/2013  . Collapse of right lung: RML 09/19/2013  . Bronchial obstruction 09/19/2013  . Leukocytosis 09/19/2013  . Obstructive pneumonia 09/19/2013  . Amaurosis fugax 05/23/2013  . Syncope 04/19/2013  . HYPERLIPIDEMIA 08/17/2008  . HYPERTENSION 08/17/2008  . ALLERGIC RHINITIS 08/17/2008  . ASTHMA 08/17/2008  . SHORTNESS OF BREATH (SOB) 08/17/2008   Past Medical History  Diagnosis Date  .  Hypertension   . Thyroid disease   . GERD (gastroesophageal reflux disease)   . Cancer   . Dyslipidemia   . Aortic insufficiency   . Mitral insufficiency   . History of nuclear stress test 02/26/2006    exercise myoview; normal pattern of perfusion; low risk scan   . PONV (postoperative nausea and vomiting)   . Heart murmur   . Hypothyroidism   . Cough     dry, endobronchial mass  . Pneumonia  pus bronchitis  . Arthritis   . Syncope     "states she has passed out a few times. dr is trying to find cause.last time when geeting ready to go home after video bronch/bx  . Shortness of breath     with exertion  . Anxiety     due to surgery   . Bronchitis    Medications on discharge:   Medication List         amLODipine 10 MG tablet  Commonly known as:  NORVASC  Take 5 mg by mouth daily.     atorvastatin 20 MG tablet  Commonly known as:  LIPITOR  Take 20 mg by mouth daily.     EPIPEN IJ  Inject 1 each as directed once as needed (for reaction to milk products).     irbesartan-hydrochlorothiazide 300-12.5 MG per tablet  Commonly known as:  AVALIDE  Take 1 tablet by mouth daily.     levothyroxine 125 MCG tablet  Commonly known as:  SYNTHROID, LEVOTHROID  Take 125 mcg by mouth daily before breakfast.     metoprolol succinate 25 MG 24 hr tablet  Commonly known as:  TOPROL-XL  Take 12.5 mg by mouth daily.     montelukast 10 MG tablet  Commonly known as:  SINGULAIR  Take 10 mg by mouth daily.     multivitamin with minerals tablet  Take 1 tablet by mouth daily.     oxyCODONE 5 MG immediate release tablet  Commonly known as:  Oxy IR/ROXICODONE  Take 1-2 tablets (5-10 mg total) by mouth every 4 (four) hours as needed for moderate pain (pain).     pantoprazole 40 MG tablet  Commonly known as:  PROTONIX  Take 40 mg by mouth 2 (two) times daily.     PROAIR HFA 108 (90 BASE) MCG/ACT inhaler  Generic drug:  albuterol  Inhale 2 puffs into the lungs every 6 (six) hours  as needed for wheezing or shortness of breath.     ranitidine 300 MG tablet  Commonly known as:  ZANTAC  Take 300 mg by mouth at bedtime.     traMADol 50 MG tablet  Commonly known as:  ULTRAM  Take 25 mg by mouth every 6 (six) hours as needed for moderate pain.     Vitamin D-3 1000 UNITS Caps  Take 1 capsule by mouth daily.       Follow-up Information   Follow up with Melrose Nakayama, MD. (12/02/2013 at 1 PM to see Dr. Roxan Hockey. Please obtain a chest x-ray at [facility] at 12 noon. [facility] is located in the same office complex.)    Specialty:  Cardiothoracic Surgery   Contact information:   [place] [address] [phone]      Disposition: discharged home  Patient's condition is Kathyrn Drown, PA-C 11/14/2013  10:49 AM

## 2013-11-14 NOTE — Progress Notes (Signed)
1 Day Post-Op Procedure(s) (LRB): VIDEO ASSISTED THORACOSCOPY (VATS) with mediastinal  biopsies (Right) Subjective: Feels better this AM Some incisional pain- controlled with PCA  Objective: Vital signs in last 24 hours: Temp:  [97.2 F (36.2 C)-98 F (36.7 C)] 98 F (36.7 C) (03/13 0325) Pulse Rate:  [58-70] 67 (03/13 0830) Cardiac Rhythm:  [-] Normal sinus rhythm (03/13 0325) Resp:  [13-22] 15 (03/13 0830) BP: (123-163)/(51-66) 124/57 mmHg (03/13 0830) SpO2:  [35 %-100 %] 94 % (03/13 0830) Arterial Line BP: (135-160)/(45-59) 150/51 mmHg (03/13 0830) Weight:  [174 lb 6.1 oz (79.1 kg)] 174 lb 6.1 oz (79.1 kg) (03/12 1645)  Hemodynamic parameters for last 24 hours:    Intake/Output from previous day: 03/12 0701 - 03/13 0700 In: 3060 [P.O.:360; I.V.:2650; IV Piggyback:50] Out: 866 [Urine:725; Blood:25; Chest Tube:116] Intake/Output this shift: Total I/O In: 100 [I.V.:100] Out: 425 [Urine:375; Chest Tube:50]  General appearance: alert and no distress Neurologic: intact Heart: regular rate and rhythm Lungs: diminished breath sounds right base Abdomen: normal findings: soft, non-tender no air leak, minimal drainage  Lab Results:  Recent Labs  11/11/13 1543 11/14/13 0355  WBC 13.1* 13.4*  HGB 11.7* 9.6*  HCT 33.6* 28.1*  PLT 529* 445*   BMET:  Recent Labs  11/11/13 1543 11/14/13 0355  NA 134* 134*  K 4.4 4.4  CL 95* 95*  CO2 22 25  GLUCOSE 121* 141*  BUN 27* 19  CREATININE 1.04 0.86  CALCIUM 10.1 9.2    PT/INR:  Recent Labs  11/11/13 1543  LABPROT 11.7  INR 0.87   ABG    Component Value Date/Time   PHART 7.410 11/14/2013 0357   HCO3 26.4* 11/14/2013 0357   TCO2 27.7 11/14/2013 0357   O2SAT 95.1 11/14/2013 0357   CBG (last 3)  No results found for this basename: GLUCAP,  in the last 72 hours  Assessment/Plan: S/P Procedure(s) (LRB): VIDEO ASSISTED THORACOSCOPY (VATS) with mediastinal  biopsies (Right) POD # 1 exploratory VATS- RML tumor with  direct extension into mediastinum- unresectable  No air leak and minimal drainage from CT- dc chest tube Continue PCA another 24 hours Decrease IVF Advance diet SCD + enoxaparin for DVT prophylaxis Ambulate Will refer to [facility] for chemoradiation   LOS: 1 day    Kimberly [name] C 11/14/2013

## 2013-11-14 NOTE — Progress Notes (Signed)
Utilization review completed. Phuc Kluttz, RN, BSN. 

## 2013-11-14 NOTE — Op Note (Signed)
[name], [name] NO.:  [id number]  MEDICAL RECORD NO.:  [id number]  LOCATION:  [id number]                        FACILITY:  [facility]  PHYSICIAN:  Revonda Standard. Hendrickson, M.D.DATE OF BIRTH:  [birth year]  DATE OF PROCEDURE:  11/13/2013 DATE OF DISCHARGE:                              OPERATIVE REPORT   PREOPERATIVE DIAGNOSIS:  Squamous cell carcinoma, right middle lobe, clinical T2, N0.  POSTOPERATIVE DIAGNOSIS:  Squamous cell carcinoma, right lower lobe, clinical T4, N0.  PROCEDURE:  Exploratory right video-assisted thoracoscopy, mediastinal biopsy.  SURGEON:  Revonda Standard. Roxan Hockey, M.D.  FIRST ASSISTANCE:  John Giovanni, P.A.-C.  ANESTHESIA:  General.  FINDINGS:  Initially, small nodules seen on pericardium.  Frozen section of those revealed no tumor, but chronic inflammation.  However, as dissection progressed, there was definite invasion into the pericardium, unable to establish a plane of dissection, tumor was unresectable.  CLINICAL NOTE:  Kimberly [name] is a 73 year old woman who initially presented with hemoptysis.  Workup revealed a right middle lobe mass. She had undergone bronchoscopy, which was nondiagnostic.  A PET-CT showed subcarinal adenopathy in addition to a large right middle lobe mass.  The patient underwent repeat bronchoscopy as well as endobronchial ultrasound and mediastinoscopy.  Biopsies of the lung revealed squamous cell carcinoma.  Biopsies of the mediastinal lymph nodes showed no tumor.  The patient was advised to undergo right video- assisted thoracoscopy for resection with a planned right middle lobectomy, but possible need for a bilobectomy.  The indications, risks, benefits, and alternatives were discussed in detail with the patient. She understood and accepted the risks and agreed to proceed.  OPERATIVE NOTE:  Kimberly [name] was brought to the preoperative holding area on November 13, 2013.  There, Anesthesia established  intravenous access and placed an arterial blood pressure monitoring line.  She was taken to the operating room, anesthetized, and intubated with a double- lumen endotracheal tube.  Intravenous antibiotics were administered. Sequential compression devices were placed on the calves for DVT prophylaxis.  A Foley catheter was placed.  She was placed in a left lateral decubitus position, and the right chest was prepped and draped in usual sterile fashion.  Single lung ventilation of the left lung was initiated and was tolerated well throughout the procedure.  A small incision was made in the seventh intercostal space in the midaxillary line, and was carried through the skin and subcutaneous tissue.  The chest was entered bluntly using a 5-mm port.  The 5-mm thoracoscope was placed in the chest.  There was small serous pleural effusion.  It was immediately obvious that there was a large mass in the middle lobe.  There were some vascularized adhesions between the middle and the lower lobe.  The middle lobe was adhesed to the pericardial fat pad.  These adhesions were taken down.  At this point, there were some small unusual-appearing nodules on the pericardium.  These were biopsied and sent for frozen section. Those biopsies ultimately returned, showing no tumor, but only chronic inflammation.  The inferior ligament was divided.  The dissection then was carried anteriorly and it was clear that there was no plane between the pericardium and the right middle lobe.  Attempts were made to work from superiorly and the pleural reflection was divided between the upper lobe and the mediastinum, but as the middle lobe was approached, there was definite direct extension into the mediastinum. No plane of dissection could be carried out.  Biopsies were not taken in this area due to concern for injury to major vascular structures, but the decision was made to abandon attempts to resect.  A 28-French chest  tube was placed through the original port incision and secured with a #1 silk suture.  The right lung was reinflated.  The small utility incision was closed with a #1 Vicryl fascial suture followed by a 2-0 Vicryl subcutaneous suture and a 3-0 Vicryl subcuticular suture.  All sponge, needle, and instrument counts were correct at the end of the procedure.  The patient was taken from the operating room to the postanesthetic care unit in good condition.     Revonda Standard Roxan Hockey, M.D.     SCH/MEDQ  D:  11/13/2013  T:  11/14/2013  Job:  [id number]

## 2013-11-15 ENCOUNTER — Inpatient Hospital Stay (HOSPITAL_COMMUNITY): Payer: Medicare Other

## 2013-11-15 LAB — CBC
HCT: 27.2 % — ABNORMAL LOW (ref 36.0–46.0)
Hemoglobin: 9.4 g/dL — ABNORMAL LOW (ref 12.0–15.0)
MCH: 31.3 pg (ref 26.0–34.0)
MCHC: 34.6 g/dL (ref 30.0–36.0)
MCV: 90.7 fL (ref 78.0–100.0)
PLATELETS: 385 10*3/uL (ref 150–400)
RBC: 3 MIL/uL — AB (ref 3.87–5.11)
RDW: 13.1 % (ref 11.5–15.5)
WBC: 12.3 10*3/uL — AB (ref 4.0–10.5)

## 2013-11-15 LAB — COMPREHENSIVE METABOLIC PANEL
ALBUMIN: 2.4 g/dL — AB (ref 3.5–5.2)
ALK PHOS: 86 U/L (ref 39–117)
ALT: 9 U/L (ref 0–35)
AST: 11 U/L (ref 0–37)
BILIRUBIN TOTAL: 0.4 mg/dL (ref 0.3–1.2)
BUN: 12 mg/dL (ref 6–23)
CHLORIDE: 100 meq/L (ref 96–112)
CO2: 26 mEq/L (ref 19–32)
CREATININE: 0.8 mg/dL (ref 0.50–1.10)
Calcium: 8.9 mg/dL (ref 8.4–10.5)
GFR calc Af Amer: 85 mL/min — ABNORMAL LOW (ref >90)
GFR, EST NON AFRICAN AMERICAN: 73 mL/min — AB (ref >90)
Glucose, Bld: 114 mg/dL — ABNORMAL HIGH (ref 70–99)
POTASSIUM: 4 meq/L (ref 3.7–5.3)
Sodium: 137 mEq/L (ref 137–147)
Total Protein: 5.4 g/dL — ABNORMAL LOW (ref 6.0–8.3)

## 2013-11-15 NOTE — Progress Notes (Addendum)
TCTS DAILY ICU PROGRESS NOTE                   [address]            [place] [address]          [phone]   2 Days Post-Op Procedure(s) (LRB): VIDEO ASSISTED THORACOSCOPY (VATS) with mediastinal  biopsies (Right)  Total Length of Stay:  LOS: 2 days   Subjective: Doing well, some soreness  Objective: Vital signs in last 24 hours: Temp:  [97.5 F (36.4 C)-99 F (37.2 C)] 98.2 F (36.8 C) (03/14 0700) Pulse Rate:  [64-73] 66 (03/14 0729) Cardiac Rhythm:  [-] Normal sinus rhythm (03/14 0750) Resp:  [12-22] 15 (03/14 0800) BP: (121-145)/(46-63) 129/48 mmHg (03/14 0729) SpO2:  [90 %-96 %] 94 % (03/14 0800)  Filed Weights   11/13/13 1645  Weight: 174 lb 6.1 oz (79.1 kg)    Weight change:    Hemodynamic parameters for last 24 hours:    Intake/Output from previous day: 03/13 0701 - 03/14 0700 In: 1620 [P.O.:360; I.V.:1260] Out: 2670 [Urine:2650; Chest Tube:20]  Intake/Output this shift: Total I/O In: 340 [P.O.:240; I.V.:100] Out: -   Current Meds: Scheduled Meds: . acetaminophen  1,000 mg Oral TID WC & HS  . amLODipine  5 mg Oral Daily  . atorvastatin  20 mg Oral Daily  . bisacodyl  10 mg Oral Daily  . enoxaparin (LOVENOX) injection  40 mg Subcutaneous Q24H  . famotidine  40 mg Oral QHS  . fentaNYL   Intravenous 6 times per day  . levothyroxine  125 mcg Oral QAC breakfast  . lung surgery book   Does not apply Once  . metoprolol succinate  12.5 mg Oral Daily  . montelukast  10 mg Oral Daily  . pantoprazole  40 mg Oral BID  . sodium chloride  10-40 mL Intracatheter Q12H   Continuous Infusions: . dextrose 5 % and 0.9% NaCl 50 mL/hr at 11/14/13 1900   PRN Meds:.albuterol, diphenhydrAMINE, diphenhydrAMINE, naloxone, ondansetron (ZOFRAN) IV, ondansetron (ZOFRAN) IV, oxyCODONE, pneumococcal 23 valent vaccine, potassium chloride, senna-docusate, sodium chloride, sodium chloride, traMADol  General appearance: alert, cooperative and no  distress Heart: regular rate and rhythm Lungs: clear to auscultation bilaterally Abdomen: benign Extremities: trace edema Wound: incis healing well  Lab Results: CBC: Recent Labs  11/14/13 0355 11/15/13 0510  WBC 13.4* 12.3*  HGB 9.6* 9.4*  HCT 28.1* 27.2*  PLT 445* 385   BMET:  Recent Labs  11/14/13 0355 11/15/13 0510  NA 134* 137  K 4.4 4.0  CL 95* 100  CO2 25 26  GLUCOSE 141* 114*  BUN 19 12  CREATININE 0.86 0.80  CALCIUM 9.2 8.9    PT/INR: No results found for this basename: LABPROT, INR,  in the last 72 hours Radiology: Dg Chest 2 View  11/15/2013   CLINICAL DATA:  Atelectasis  EXAM: CHEST  2 VIEW  COMPARISON:  11/14/1948  FINDINGS: Cardiac shadow is within normal limits. A right central venous line is again seen and stable. Right lower lobe infiltrate is again identified but mildly improved when compare with the prior study. Some fluid is noted within the minor fissure.  IMPRESSION: Improved aeration in the right base   Electronically Signed   By: Inez Catalina M.D.   On: 11/15/2013 07:37   Dg Chest Port 1 View  11/14/2013   CLINICAL DATA:  Right chest tube removed.  EXAM: PORTABLE CHEST - 1 VIEW  COMPARISON:  DG CHEST 1V PORT dated 11/14/2013; CT CHEST W/O CM dated 09/19/2013; DG CHEST 2 VIEW dated 09/19/2013  FINDINGS: The right chest tube has been removed. Question a tiny right apical pneumothorax. There is no evidence for a large pneumothorax. Stable density in the right mid lung. Again noted is volume loss at the right lung base. Central venous line in the lower SVC. Stable linear density in the left mid lung is consistent with scarring or atelectasis.  IMPRESSION: Removal of right chest tube and cannot exclude a tiny right apical pneumothorax. No evidence for a large pneumothorax.  Stable densities or volume loss in the right middle lobe region.   Electronically Signed   By: Markus Daft M.D.   On: 11/14/2013 10:58   Dg Chest Port 1 View  11/14/2013   CLINICAL DATA:   Right chest tube, followup portable exam 0618 hr compared to 11/13/2013  EXAM: PORTABLE CHEST - 1 VIEW  COMPARISON:  None.  FINDINGS: Right thoracostomy tube stable.  Right jugular central venous catheter tip projecting over SVC near cavoatrial junction.  Enlargement of cardiac silhouette.  Mediastinal contours and pulmonary vascularity normal.  Bibasilar atelectasis greater on right.  No definite infiltrate, pleural effusion or pneumothorax.  IMPRESSION: Enlargement of cardiac silhouette.  Bibasilar atelectasis greater on right.  Little interval change.   Electronically Signed   By: Lavonia Dana M.D.   On: 11/14/2013 08:15   Dg Chest Portable 1 View  11/13/2013   CLINICAL DATA:  Postop resection of right middle lobe squamous cell carcinoma  EXAM: PORTABLE CHEST - 1 VIEW  COMPARISON:  Chest x-ray of 11/11/2013  FINDINGS: There is opacity at the right lung base postoperatively due to postop change and atelectasis. A right chest tube is present and no right pneumothorax is seen. Aeration of the left lung has diminished with areas of linear atelectasis. A right IJ stent venous line tip overlies the lower SVC. Heart size is normal on this poor inspiration film.  IMPRESSION: Postop opacity at the right lung base after resection of right middle lobe squamous cell carcinoma. Right chest tube is present with no pneumothorax.   Electronically Signed   By: Ivar Drape M.D.   On: 11/13/2013 15:23     Assessment/Plan: S/P Procedure(s) (LRB): VIDEO ASSISTED THORACOSCOPY (VATS) with mediastinal  biopsies (Right) 1 d/c pca 2 cont pulm toilet/rehab 3 labs stable 4 poss home 1-2 days    GOLD,WAYNE E 11/15/2013 10:54 AM  Patient comfortable after old chest tube, not using PCA Plan discharge home tomorrow

## 2013-11-16 DIAGNOSIS — C342 Malignant neoplasm of middle lobe, bronchus or lung: Secondary | ICD-10-CM | POA: Diagnosis not present

## 2013-11-16 MED ORDER — PNEUMOCOCCAL VAC POLYVALENT 25 MCG/0.5ML IJ INJ
0.5000 mL | INJECTION | INTRAMUSCULAR | Status: AC
  Administered 2013-11-16: 0.5 mL via INTRAMUSCULAR
  Filled 2013-11-16: qty 0.5

## 2013-11-16 MED ORDER — OXYCODONE HCL 5 MG PO TABS: 5.0000 mg | ORAL_TABLET | ORAL | Status: DC | PRN

## 2013-11-16 NOTE — Progress Notes (Signed)
TCTS DAILY ICU PROGRESS NOTE                   [address]            [place] [address]          [phone]   3 Days Post-Op Procedure(s) (LRB): VIDEO ASSISTED THORACOSCOPY (VATS) with mediastinal  biopsies (Right)  Total Length of Stay:  LOS: 3 days   Subjective: conts to feel pretty well  Objective: Vital signs in last 24 hours: Temp:  [98 F (36.7 C)-98.5 F (36.9 C)] 98.2 F (36.8 C) (03/15 0700) Pulse Rate:  [60-78] 78 (03/15 0750) Cardiac Rhythm:  [-] Normal sinus rhythm (03/15 0750) Resp:  [14-19] 17 (03/15 0750) BP: (131-153)/(45-65) 153/65 mmHg (03/15 0750) SpO2:  [93 %-99 %] 94 % (03/15 0750)  Filed Weights   11/13/13 1645  Weight: 174 lb 6.1 oz (79.1 kg)    Weight change:    Hemodynamic parameters for last 24 hours:    Intake/Output from previous day: 03/14 0701 - 03/15 0700 In: 340 [P.O.:240; I.V.:100] Out: 1225 [Urine:1225]  Intake/Output this shift:    Current Meds: Scheduled Meds: . acetaminophen  1,000 mg Oral TID WC & HS  . amLODipine  5 mg Oral Daily  . atorvastatin  20 mg Oral Daily  . bisacodyl  10 mg Oral Daily  . enoxaparin (LOVENOX) injection  40 mg Subcutaneous Q24H  . famotidine  40 mg Oral QHS  . levothyroxine  125 mcg Oral QAC breakfast  . lung surgery book   Does not apply Once  . metoprolol succinate  12.5 mg Oral Daily  . montelukast  10 mg Oral Daily  . pantoprazole  40 mg Oral BID  . sodium chloride  10-40 mL Intracatheter Q12H   Continuous Infusions:  PRN Meds:.albuterol, ondansetron (ZOFRAN) IV, oxyCODONE, pneumococcal 23 valent vaccine, potassium chloride, senna-docusate, sodium chloride, traMADol  General appearance: alert, cooperative and no distress Heart: regular rate and rhythm Lungs: clear to auscultation bilaterally Abdomen: benign Extremities: minor edema Wound: incis healing well  Lab Results: CBC: Recent Labs  11/14/13 0355 11/15/13 0510  WBC 13.4* 12.3*  HGB 9.6* 9.4*  HCT  28.1* 27.2*  PLT 445* 385   BMET:  Recent Labs  11/14/13 0355 11/15/13 0510  NA 134* 137  K 4.4 4.0  CL 95* 100  CO2 25 26  GLUCOSE 141* 114*  BUN 19 12  CREATININE 0.86 0.80  CALCIUM 9.2 8.9    PT/INR: No results found for this basename: LABPROT, INR,  in the last 72 hours Radiology: Dg Chest 2 View  11/15/2013   CLINICAL DATA:  Atelectasis  EXAM: CHEST  2 VIEW  COMPARISON:  11/14/1948  FINDINGS: Cardiac shadow is within normal limits. A right central venous line is again seen and stable. Right lower lobe infiltrate is again identified but mildly improved when compare with the prior study. Some fluid is noted within the minor fissure.  IMPRESSION: Improved aeration in the right base   Electronically Signed   By: Inez Catalina M.D.   On: 11/15/2013 07:37     Assessment/Plan: S/P Procedure(s) (LRB): VIDEO ASSISTED THORACOSCOPY (VATS) with mediastinal  biopsies (Right) Plan for discharge: see discharge orders     Kimberly [name] E 11/16/2013 11:30 AM

## 2013-11-16 NOTE — Progress Notes (Addendum)
Discharge instructions given to patient and family. Questions answered. Instructed to call Dr. Leonarda Salon office to clarify heme/onc appt time this Thursday. Appointment cards and Rx given. VSS. eICU and CCMT notified of discharge. R IJ removed without complications. R VATS site clean, dry, intact. PNA vaccine given in right deltoid. Pt getting dressed with help of daughter. Pt to be escorted to private vehicle by NT via wheelchair.

## 2013-11-16 NOTE — Progress Notes (Signed)
Physician notified: Jadene Pierini, PA At: 1218  Regarding: OK to place order for PNA vaccine?  Awaiting return response.   Returned Response at: 1220  Order(s): Give PNA vaccine prior to DC. Call pharmacy for dosing.   Pharmacy: Donella Stade, PharmD. Dose PNA-23.

## 2013-11-18 ENCOUNTER — Encounter (HOSPITAL_COMMUNITY): Payer: Self-pay | Admitting: Thoracic Surgery (Cardiothoracic Vascular Surgery)

## 2013-11-20 ENCOUNTER — Ambulatory Visit (HOSPITAL_BASED_OUTPATIENT_CLINIC_OR_DEPARTMENT_OTHER): Payer: Medicare Other | Admitting: Internal Medicine

## 2013-11-20 ENCOUNTER — Encounter: Payer: Self-pay | Admitting: *Deleted

## 2013-11-20 ENCOUNTER — Encounter: Payer: Self-pay | Admitting: Internal Medicine

## 2013-11-20 ENCOUNTER — Encounter: Payer: Self-pay | Admitting: Radiation Oncology

## 2013-11-20 ENCOUNTER — Telehealth: Payer: Self-pay | Admitting: Internal Medicine

## 2013-11-20 ENCOUNTER — Ambulatory Visit
Admission: RE | Admit: 2013-11-20 | Discharge: 2013-11-20 | Disposition: A | Discharge status: Home / Self Care | Payer: Medicare Other | Source: Ambulatory Visit | Attending: Radiation Oncology | Admitting: Radiation Oncology

## 2013-11-20 ENCOUNTER — Ambulatory Visit: Payer: Medicare Other | Attending: Internal Medicine | Admitting: Physical Therapy

## 2013-11-20 VITALS — BP 144/57 | HR 67 | Temp 98.1°F | Resp 17 | Ht 60.0 in | Wt 162.9 lb

## 2013-11-20 DIAGNOSIS — R05 Cough: Secondary | ICD-10-CM

## 2013-11-20 DIAGNOSIS — R109 Unspecified abdominal pain: Secondary | ICD-10-CM

## 2013-11-20 DIAGNOSIS — M545 Low back pain, unspecified: Secondary | ICD-10-CM

## 2013-11-20 DIAGNOSIS — IMO0001 Reserved for inherently not codable concepts without codable children: Secondary | ICD-10-CM | POA: Insufficient documentation

## 2013-11-20 DIAGNOSIS — C3491 Malignant neoplasm of unspecified part of right bronchus or lung: Secondary | ICD-10-CM

## 2013-11-20 DIAGNOSIS — Z801 Family history of malignant neoplasm of trachea, bronchus and lung: Secondary | ICD-10-CM

## 2013-11-20 DIAGNOSIS — M2550 Pain in unspecified joint: Secondary | ICD-10-CM | POA: Insufficient documentation

## 2013-11-20 DIAGNOSIS — R059 Cough, unspecified: Secondary | ICD-10-CM

## 2013-11-20 DIAGNOSIS — C342 Malignant neoplasm of middle lobe, bronchus or lung: Secondary | ICD-10-CM

## 2013-11-20 NOTE — Telephone Encounter (Signed)
Gave pt appt for lab,md and chemo class, email Sharyn Lull regarding chemo appts

## 2013-11-20 NOTE — Progress Notes (Signed)
Kimberly [name] Clinical Social Work  Clinical Social Work met with patient/family and Futures trader at [facility] appointment to offer support and assess for psychosocial needs.  Medical oncologist reviewed patient's diagnosis and recommended treatment plan with patient/family.  The patient is eager to begin treatment and was relieved to hear that she would most likely experience minimal side effects from chemotherapy treatment. She has strong support system: Patient's son lives in [place] and daughter lives in [place].  The patient also reports close friends, neighbors, and church family.  CSW and family discussed home care services as an option if needed.  Clinical Social Work briefly discussed Clinical Social Work role and [employer] support programs/services.  Clinical Social Work encouraged patient to call with any additional questions or concerns.   [name], MSW, LCSW, OSW-C Clinical Social Worker [facility] [phone]

## 2013-11-20 NOTE — Progress Notes (Signed)
[place] [phone]   [phone] Multidisciplinary thoracic oncology clinic ([place])  CONSULT NOTE  REFERRING PHYSICIAN:  Dr. Modesto Charon.  REASON FOR CONSULTATION:   73 years old white female recently diagnosed with lung cancer  HPI [name] is a 73 y.o. female with a past medical history significant for multiple medical problems including history of hypertension, dyslipidemia, allergic rhinitis, GERD, hypothyroidism, mitral and aortic valve regurgitation with a short period of smoking history but quit in 1969. The patient mentions that over the last few years she has been complaining of cough and shortness of breath in episodes. Her cough and shortness of breath were getting worse and in November of 2014 she had bronchitis with cough productive of blood-tinged sputum. She was seen by her primary care physician in January 2015 and on 09/19/2013,  Chest x-ray was performed and showed the right middle lobe consolidation concerning for pneumonia but postobstructive collapse is possible. This was followed by CT scan of the chest without contrast on the same day and it showed complete downing or collapse of the right middle lobe with abrupt termination of the right middle lobe bronchus just beyond its origin. The findings were suspicious for an obstructing bronchial lesion and bronchoscopy was recommended. On 10/15/2013 the patient had a PET scan performed and it showed a mass extending from the right hilum into the right middle lobe which constricts the right middle lobe bronchus resulting in atelectasis. This mass measured approximately 5.5 by 3.3 and is intensely hypermetabolic with SUV max of 66.4. There was also a hypermetabolic subcarinal lymph node measuring 10 mm short axis with SUV max of 10.7. No additional hypermetabolic lymph nodes and no extrathoracic evidence of malignancy.  The patient was seen by Dr. Melvyn Novas and she was referred to Dr.  Roxan Hockey. Bronchoscopy with brushings and biopsies, endobronchial ultrasound with mediastinal lymph node aspiration as well as mediastinoscopy were performed on 10/30/2013. The final pathology (Accession: [id number]) from the right middle lobe was consistent with invasive squamous cell carcinoma. Biopsies of the lymph nodes from level 7, 4R and 4L were negative for malignancy. On 11/13/2013 the patient underwent exploratory right VATS with mediastinal biopsy under the care of Dr. Roxan Hockey. His note indicates that there was definite invasion into the pericardium and he was unable to establish a plane of dissection and the tumor was unresectable. Dr. Roxan Hockey kindly referred the patient to me today for further evaluation and recommendation regarding treatment of her condition. When seen today the patient is feeling fine with no specific complaints except for occasional confusion secondary to pain medications. She also has soreness on the right side of the chest after the surgical incision. She also has low back pain in addition to cough productive of clear thick mucus. She has shortness of breath with exertion. She lost around 26 pounds in the last 5 months. She has occasional blurry vision but no headaches. Her family history significant for a mother and uncle with lung cancer. Father died from heart disease at age 88. The patient is single and has 2 children. She was accompanied today by her son [name] and her daughter [name]. She is currently retired and used to work for [employer]. She has remote history of short period of smoking but quit in 1969, but she has a long history of secondhand smoke exposure. She drinks alcohol occasionally and no history of drug abuse.  HPI  Past Medical History  Diagnosis Date  . Hypertension   .  Thyroid disease   . GERD (gastroesophageal reflux disease)   . Cancer   . Dyslipidemia   . Aortic insufficiency   . Mitral insufficiency   . History of  nuclear stress test 02/26/2006    exercise myoview; normal pattern of perfusion; low risk scan   . PONV (postoperative nausea and vomiting)   . Heart murmur   . Hypothyroidism   . Cough     dry, endobronchial mass  . Pneumonia     pus bronchitis  . Arthritis   . Syncope     "states she has passed out a few times. dr is trying to find cause.last time when geeting ready to go home after video bronch/bx  . Shortness of breath     with exertion  . Anxiety     due to surgery   . Bronchitis     Past Surgical History  Procedure Laterality Date  . Thyroidectomy  1973  . Dilation and curettage of uterus    . Carpal tunnel release Right 1988  . Rotator cuff repair  2006    ? side  . H/o met test w/pft  04/02/2012    low risk; peak VO2 77% predicted  . Cardiac catheterization  07/08/2008    normal coronaries  . Transthoracic echocardiogram  09/25/2012    EF 55-60%; mild LVH & mild concentric hypertrophy; mild AV regurg; RV systolic pressure increase consistent with mild pulm HTN  . Video bronchoscopy Bilateral 09/25/2013    Procedure: VIDEO BRONCHOSCOPY WITHOUT FLUORO;  Surgeon: Tanda Rockers, MD;  Location: [facility];  Service: Cardiopulmonary;  Laterality: Bilateral;  . Joint replacement  2003    thumb rt  . Knee arthroscopy  12    rt meniscus  . Video bronchoscopy with endobronchial ultrasound N/A 10/30/2013    Procedure: VIDEO BRONCHOSCOPY WITH ENDOBRONCHIAL ULTRASOUND;  Surgeon: Melrose Nakayama, MD;  Location: [place];  Service: Thoracic;  Laterality: N/A;  . Mediastinoscopy N/A 10/30/2013    Procedure: MEDIASTINOSCOPY;  Surgeon: Melrose Nakayama, MD;  Location: [place];  Service: Thoracic;  Laterality: N/A;  . Eye surgery Bilateral   . Video assisted thoracoscopy (vats)/ lobectomy Right 11/13/2013    Procedure: VIDEO ASSISTED THORACOSCOPY (VATS) with mediastinal  biopsies;  Surgeon: Melrose Nakayama, MD;  Location: [place];  Service: Thoracic;  Laterality: Right;  RIGHT  VATS,mediastinal biopsies    Family History  Problem Relation Age of Onset  . Lung cancer Mother   . Heart attack Father   . Heart failure Maternal Grandfather   . Heart attack Paternal Grandfather     Social History History  Substance Use Topics  . Smoking status: Former Smoker -- 0.50 packs/day for 6 years    Types: Cigarettes    Quit date: 09/05/1967  . Smokeless tobacco: Never Used     Comment: occ wine  . Alcohol Use: Yes     Comment: occasional 1-2 drinks/month    Allergies  Allergen Reactions  . Milk-Related Compounds Other (See Comments)    REACTION: GI upset, projectile vomiting    Current Outpatient Prescriptions  Medication Sig Dispense Refill  . albuterol (PROAIR HFA) 108 (90 BASE) MCG/ACT inhaler Inhale 2 puffs into the lungs every 6 (six) hours as needed for wheezing or shortness of breath.      [name] amLODipine (NORVASC) 10 MG tablet Take 5 mg by mouth daily.       [name] atorvastatin (LIPITOR) 20 MG tablet Take 20 mg by mouth daily.      [name]  irbesartan-hydrochlorothiazide (AVALIDE) 300-12.5 MG per tablet Take 1 tablet by mouth daily.      [name] levothyroxine (SYNTHROID, LEVOTHROID) 125 MCG tablet Take 125 mcg by mouth daily before breakfast.      . metoprolol succinate (TOPROL-XL) 25 MG 24 hr tablet Take 12.5 mg by mouth daily.      . montelukast (SINGULAIR) 10 MG tablet Take 10 mg by mouth daily.      [name] oxyCODONE (OXY IR/ROXICODONE) 5 MG immediate release tablet Take 1-2 tablets (5-10 mg total) by mouth every 4 (four) hours as needed for moderate pain (pain).  30 tablet  0  . pantoprazole (PROTONIX) 40 MG tablet Take 40 mg by mouth 2 (two) times daily.       . ranitidine (ZANTAC) 300 MG tablet Take 300 mg by mouth at bedtime.      . SYMBICORT 160-4.5 MCG/ACT inhaler       . traMADol (ULTRAM) 50 MG tablet Take 25 mg by mouth every 6 (six) hours as needed for moderate pain.      . Cholecalciferol (VITAMIN D-3) 1000 UNITS CAPS Take 1 capsule by mouth daily.       [name]  EPINEPHrine (EPIPEN IJ) Inject 1 each as directed once as needed (for reaction to milk products).      . Multiple Vitamins-Minerals (MULTIVITAMIN WITH MINERALS) tablet Take 1 tablet by mouth daily.       No current facility-administered medications for this visit.    Review of Systems  Constitutional: positive for fatigue and weight loss Eyes: negative Ears, nose, mouth, throat, and face: negative Respiratory: positive for cough, dyspnea on exertion and pleurisy/chest pain Cardiovascular: negative Gastrointestinal: negative Genitourinary:negative Integument/breast: negative Hematologic/lymphatic: negative Musculoskeletal:negative Neurological: positive for weakness Behavioral/Psych: negative Endocrine: negative Allergic/Immunologic: negative  Physical Exam  IYM:EBRAX, healthy, no distress, well nourished and well developed SKIN: skin color, texture, turgor are normal, no rashes or significant lesions HEAD: Normocephalic, No masses, lesions, tenderness or abnormalities EYES: normal, PERRLA EARS: External ears normal, Canals clear OROPHARYNX:no exudate, no erythema and lips, buccal mucosa, and tongue normal  NECK: supple, no adenopathy, no JVD LYMPH:  no palpable lymphadenopathy, no hepatosplenomegaly BREAST:not examined LUNGS: clear to auscultation , and palpation HEART: regular rate & rhythm and no murmurs ABDOMEN:abdomen soft, non-tender, normal bowel sounds and no masses or organomegaly BACK: Back symmetric, no curvature., No CVA tenderness EXTREMITIES:no joint deformities, effusion, or inflammation, no edema, no skin discoloration, no clubbing  NEURO: alert & oriented x 3 with fluent speech, no focal motor/sensory deficits  PERFORMANCE STATUS: ECOG 1  LABORATORY DATA: Lab Results  Component Value Date   WBC 12.3* 11/15/2013   HGB 9.4* 11/15/2013   HCT 27.2* 11/15/2013   MCV 90.7 11/15/2013   PLT 385 11/15/2013      Chemistry      Component Value Date/Time   NA  137 11/15/2013 0510   K 4.0 11/15/2013 0510   CL 100 11/15/2013 0510   CO2 26 11/15/2013 0510   BUN 12 11/15/2013 0510   CREATININE 0.80 11/15/2013 0510      Component Value Date/Time   CALCIUM 8.9 11/15/2013 0510   ALKPHOS 86 11/15/2013 0510   AST 11 11/15/2013 0510   ALT 9 11/15/2013 0510   BILITOT 0.4 11/15/2013 0510       RADIOGRAPHIC STUDIES: Dg Chest 2 View  11/15/2013   CLINICAL DATA:  Atelectasis  EXAM: CHEST  2 VIEW  COMPARISON:  11/14/1948  FINDINGS: Cardiac shadow is within normal limits. A  right central venous line is again seen and stable. Right lower lobe infiltrate is again identified but mildly improved when compare with the prior study. Some fluid is noted within the minor fissure.  IMPRESSION: Improved aeration in the right base   Electronically Signed   By: Inez Catalina M.D.   On: 11/15/2013 07:37   Dg Chest 2 View  11/11/2013   CLINICAL DATA Squamous carcinoma of lung  EXAM CHEST  2 VIEW  COMPARISON CXR 10/30/2013, chest CT 09/19/2013  FINDINGS Right middle lobe volume loss unchanged from the CT. This could be due to endobronchial obstruction from a mass.  Calcified granuloma left lung base unchanged  Negative for heart failure or effusion.  Heart size is normal.  IMPRESSION Right middle lobe volume loss unchanged. Endobronchial obstructing lesion not excluded.  SIGNATURE  Electronically Signed   By: Franchot Gallo M.D.   On: 11/11/2013 16:15   Chest 2 View  10/28/2013   CLINICAL DATA:  Lung mass, mediastinal adenopathy  EXAM: CHEST  2 VIEW  COMPARISON:  10/15/2013  FINDINGS: Cardiac size is stable. Again noted mass in right middle lobe with associated collapse. No pulmonary edema. Mild degenerative changes thoracic spine.  IMPRESSION: Again noted right middle lobe mass with associated lung collapse. No pulmonary edema.   Electronically Signed   By: Lahoma Crocker M.D.   On: 10/28/2013 10:25   Dg Chest Port 1 View  11/14/2013   CLINICAL DATA:  Right chest tube removed.  EXAM:  PORTABLE CHEST - 1 VIEW  COMPARISON:  DG CHEST 1V PORT dated 11/14/2013; CT CHEST W/O CM dated 09/19/2013; DG CHEST 2 VIEW dated 09/19/2013  FINDINGS: The right chest tube has been removed. Question a tiny right apical pneumothorax. There is no evidence for a large pneumothorax. Stable density in the right mid lung. Again noted is volume loss at the right lung base. Central venous line in the lower SVC. Stable linear density in the left mid lung is consistent with scarring or atelectasis.  IMPRESSION: Removal of right chest tube and cannot exclude a tiny right apical pneumothorax. No evidence for a large pneumothorax.  Stable densities or volume loss in the right middle lobe region.   Electronically Signed   By: Markus Daft M.D.   On: 11/14/2013 10:58   Dg Chest Port 1 View  11/14/2013   CLINICAL DATA:  Right chest tube, followup portable exam 0618 hr compared to 11/13/2013  EXAM: PORTABLE CHEST - 1 VIEW  COMPARISON:  None.  FINDINGS: Right thoracostomy tube stable.  Right jugular central venous catheter tip projecting over SVC near cavoatrial junction.  Enlargement of cardiac silhouette.  Mediastinal contours and pulmonary vascularity normal.  Bibasilar atelectasis greater on right.  No definite infiltrate, pleural effusion or pneumothorax.  IMPRESSION: Enlargement of cardiac silhouette.  Bibasilar atelectasis greater on right.  Little interval change.   Electronically Signed   By: Lavonia Dana M.D.   On: 11/14/2013 08:15   Dg Chest Portable 1 View  11/13/2013   CLINICAL DATA:  Postop resection of right middle lobe squamous cell carcinoma  EXAM: PORTABLE CHEST - 1 VIEW  COMPARISON:  Chest x-ray of 11/11/2013  FINDINGS: There is opacity at the right lung base postoperatively due to postop change and atelectasis. A right chest tube is present and no right pneumothorax is seen. Aeration of the left lung has diminished with areas of linear atelectasis. A right IJ stent venous line tip overlies the lower SVC. Heart  size is  normal on this poor inspiration film.  IMPRESSION: Postop opacity at the right lung base after resection of right middle lobe squamous cell carcinoma. Right chest tube is present with no pneumothorax.   Electronically Signed   By: Ivar Drape M.D.   On: 11/13/2013 15:23   Dg Chest Port 1 View  10/30/2013   CLINICAL DATA:  Status post mediastinoscopy.  EXAM: PORTABLE CHEST - 1 VIEW  COMPARISON:  10/28/2013  FINDINGS: Lungs are hypoinflated with persistent opacification over the right middle lobe. Subtle opacification in the left retrocardiac region. No definite effusion. Cardiomediastinal silhouette and remainder of the exam is unchanged.  IMPRESSION: Persistent opacification over right middle lobe without significant change. New mild opacification the left retrocardiac region which may be due to atelectasis although cannot exclude infection.   Electronically Signed   By: Marin Olp M.D.   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 The patient has unresectable disease because of the pericardial invasion suggesting T3 disease.  PLAN: I had a lengthy discussion with the patient and her family today about her current disease stage, prognosis and treatment options. I recommended for the patient a course of concurrent chemoradiation with weekly carboplatin for AUC of 2 and paclitaxel 45 mg/M246-7 weeks depending on the final dose of radiation. I also discussed with the patient and her family other chemotherapy treatment options including treatment with cisplatin and etoposide for 6 days every 4 weeks. The patient is interested in the treatment with the concurrent chemoradiation with weekly  carboplatin and paclitaxel but she is seeing a medical oncologist tomorrow in [facility] for a second opinion based on the request of her daughter who lives in [place].  I discussed with the patient adverse effect of the chemotherapy including but not limited to alopecia, myelosuppression, nausea and vomiting, peripheral neuropathy, liver or renal dysfunction. The patient was also seen by Dr. Sondra Come for evaluation and discussion of the radiotherapy option. If she decided to receive her treatment in [place], I will arrange for her to receive the first cycle on 12/01/2013. I will arrange for the patient to have a chemotherapy education class next week. I would also electronically prescribe Compazine 10 mg by mouth every 6 hours as needed for nausea. The patient would come back for follow up visit in 3 weeks for reevaluation and management any adverse effect of her treatment. If she has good response to this course of concurrent chemoradiation, she may be considered for surgical resection in the future. She was seen today by the multidisciplinary thoracic oncology team including medical oncology, radiation oncology, thoracic navigator, social worker as well as physical therapist. The patient was advised to call immediately she has any concerning symptoms in the interval. The patient voices understanding of current disease status and treatment options and is in agreement with the current care plan.  All questions were answered. The patient knows to call the clinic with any problems, questions or concerns. We can certainly see the patient much sooner if necessary.  Thank you so much for allowing me to participate in the care of Kimberly [name]. I will continue to follow up the patient with you and assist in her care.  I spent  55 minutes counseling the patient face to face. The total time spent in the appointment was 80 minutes.  Disclaimer: This note was dictated with voice  recognition software. Similar sounding words can inadvertently be transcribed and may not be corrected upon review.   Jaiyanna Safran K. 11/20/2013, 3:55 PM

## 2013-11-20 NOTE — Telephone Encounter (Signed)
Gave pt appt for lab,md and chemo for March and APril 2015

## 2013-11-20 NOTE — Progress Notes (Signed)
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 female who is seen out of the courtesy of Dr. Roxan Hockey as part of the multidisciplinary thoracic oncology clinic. Patient recently was taken the operating room for attempted resection of her right middle lobe mass. At the time of her operative procedure the tumor was actually felt to be in the right lower lobe and there was invasion into the pericardium. Patient was deemed unresectable at that time. She is now seen to be considered for radiation and chemotherapy with consideration for surgery at a later date if she has a good response to her therapy.[name]  PREVIOUS RADIATION THERAPY: No  PAST MEDICAL HISTORY:  has a past medical history of Hypertension; Thyroid disease; GERD (gastroesophageal reflux disease); Cancer; Dyslipidemia; Aortic insufficiency; Mitral insufficiency; History of nuclear stress test (02/26/2006); PONV (postoperative nausea and vomiting); Heart murmur; Hypothyroidism; Cough; Pneumonia; Arthritis; Syncope; Shortness of breath; Anxiety; and Bronchitis.    PAST SURGICAL HISTORY: Past Surgical History  Procedure Laterality Date  . Thyroidectomy  1973  . Dilation and curettage of uterus    . Carpal tunnel release Right 1988  . Rotator cuff repair  2006    ? side  . H/o met test w/pft  04/02/2012    low risk; peak VO2 77% predicted  . Cardiac catheterization  07/08/2008    normal coronaries  . Transthoracic echocardiogram  09/25/2012    EF 55-60%; mild LVH & mild concentric hypertrophy; mild AV regurg; RV systolic pressure increase consistent with  mild pulm HTN  . Video bronchoscopy Bilateral 09/25/2013    Procedure: VIDEO BRONCHOSCOPY WITHOUT FLUORO;  Surgeon: Tanda Rockers, MD;  Location: [facility];  Service: Cardiopulmonary;  Laterality: Bilateral;  . Joint replacement  2003    thumb rt  . Knee arthroscopy  12    rt meniscus  . Video bronchoscopy with endobronchial ultrasound N/A 10/30/2013    Procedure: VIDEO BRONCHOSCOPY WITH ENDOBRONCHIAL ULTRASOUND;  Surgeon: Melrose Nakayama, MD;  Location: [facility];  Service: Thoracic;  Laterality: N/A;  . Mediastinoscopy N/A 10/30/2013    Procedure: MEDIASTINOSCOPY;  Surgeon: Melrose Nakayama, MD;  Location: [facility];  Service: Thoracic;  Laterality: N/A;  . Eye surgery Bilateral   . Video assisted thoracoscopy (vats)/ lobectomy Right 11/13/2013    Procedure: VIDEO ASSISTED THORACOSCOPY (VATS) with mediastinal  biopsies;  Surgeon: Melrose Nakayama, MD;  Location: [facility];  Service: Thoracic;  Laterality: Right;  RIGHT VATS,mediastinal biopsies    FAMILY HISTORY: family history includes Heart attack in her father and paternal grandfather; Heart failure in her maternal grandfather; Lung cancer in her mother.  SOCIAL HISTORY:  reports that she quit smoking about 46 years ago. Her smoking use included Cigarettes. She has a 3 pack-year smoking history. She has never used smokeless tobacco. She reports that she drinks alcohol. She reports that she does not use illicit drugs. lives in the [place] area by herself.. Her daughter lives in the [place] area and her son in the [place] area.  ALLERGIES: Milk-related compounds  MEDICATIONS:  Current Outpatient Prescriptions  Medication Sig Dispense Refill  . albuterol (PROAIR HFA)  108 (90 BASE) MCG/ACT inhaler Inhale 2 puffs into the lungs every 6 (six) hours as needed for wheezing or shortness of breath.      [name] amLODipine (NORVASC) 10 MG tablet Take 5 mg by mouth daily.       [name] atorvastatin (LIPITOR) 20 MG tablet Take 20 mg by mouth daily.       . Cholecalciferol (VITAMIN D-3) 1000 UNITS CAPS Take 1 capsule by mouth daily.       [name] EPINEPHrine (EPIPEN IJ) Inject 1 each as directed once as needed (for reaction to milk products).      . irbesartan-hydrochlorothiazide (AVALIDE) 300-12.5 MG per tablet Take 1 tablet by mouth daily.      [name] levothyroxine (SYNTHROID, LEVOTHROID) 125 MCG tablet Take 125 mcg by mouth daily before breakfast.      . metoprolol succinate (TOPROL-XL) 25 MG 24 hr tablet Take 12.5 mg by mouth daily.      . montelukast (SINGULAIR) 10 MG tablet Take 10 mg by mouth daily.      . Multiple Vitamins-Minerals (MULTIVITAMIN WITH MINERALS) tablet Take 1 tablet by mouth daily.      [name] oxyCODONE (OXY IR/ROXICODONE) 5 MG immediate release tablet Take 1-2 tablets (5-10 mg total) by mouth every 4 (four) hours as needed for moderate pain (pain).  30 tablet  0  . pantoprazole (PROTONIX) 40 MG tablet Take 40 mg by mouth 2 (two) times daily.       . ranitidine (ZANTAC) 300 MG tablet Take 300 mg by mouth at bedtime.      . SYMBICORT 160-4.5 MCG/ACT inhaler       . traMADol (ULTRAM) 50 MG tablet Take 25 mg by mouth every 6 (six) hours as needed for moderate pain.       No current facility-administered medications for this encounter.    REVIEW OF SYSTEMS:  A 15 point review of systems is documented in the electronic medical record. This was obtained by the nursing staff. However, I reviewed this with the patient to discuss relevant findings and make appropriate changes.  She does have some fatigue since her surgery. She has some appropriate soreness along her chest scar. She denies any significant breathing problems. She denies any new headaches dizziness or blurred vision. Patient was fully functional prior to her surgery.   PHYSICAL EXAM:    Vitals - 1 value per visit 12/11/8117  SYSTOLIC 147  DIASTOLIC 57  Pulse 67  Temperature 98.1  Respirations 17  Weight (lb) 162.9  Height '5\' 0"'   BMI 31.81  VISIT REPORT     General  Appearance:    Alert, cooperative, no distress, appears stated age, accompanied by daughter and son on evaluation today   Head:    Normocephalic, without obvious abnormality, atraumatic  Eyes:    PERRL, conjunctiva/corneas clear, EOM's intact,        Nose:   Nares normal, septum midline, mucosa normal, no drainage    or sinus tenderness  Throat:   Lips, mucosa, and tongue normal; teeth and gums normal  Neck:   Supple, symmetrical, trachea midline, no adenopathy;    thyroid:  no enlargement/tenderness/nodules; no carotid   bruit or JVD  Back:     Symmetric, no curvature, ROM normal, no CVA tenderness  Lungs:     Clear to auscultation bilaterally, respirations unlabored  Chest Wall:    small right thoracotomy scar healing well without signs of drainage or infection    Heart:    Regular rate  and rhythm, S1 and S2 normal, no murmur, rub   or gallop     Abdomen:     Soft, non-tender, bowel sounds active all four quadrants,    no masses, no organomegaly        Extremities:   Extremities normal, atraumatic, no cyanosis or edema  Pulses:   2+ and symmetric all extremities  Skin:   Skin color, texture, turgor normal, no rashes or lesions  Lymph nodes:   Cervical, supraclavicular, and axillary nodes normal  Neurologic:    normal strength, sensation and reflexes    throughout    ECOG = 1    1 - Symptomatic but completely ambulatory (Restricted in physically strenuous activity but ambulatory and able to carry out work of a light or sedentary nature. For example, light housework, office work)  LABORATORY DATA:  Lab Results  Component Value Date   WBC 12.3* 11/15/2013   HGB 9.4* 11/15/2013   HCT 27.2* 11/15/2013   MCV 90.7 11/15/2013   PLT 385 11/15/2013   Lab Results  Component Value Date   NA 137 11/15/2013   K 4.0 11/15/2013   CL 100 11/15/2013   CO2 26 11/15/2013   Lab Results  Component Value Date   ALT 9 11/15/2013   AST 11 11/15/2013   ALKPHOS 86 11/15/2013   BILITOT 0.4 11/15/2013       RADIOGRAPHY: Dg Chest 2 View  11/15/2013   CLINICAL DATA:  Atelectasis  EXAM: CHEST  2 VIEW  COMPARISON:  11/14/1948  FINDINGS: Cardiac shadow is within normal limits. A right central venous line is again seen and stable. Right lower lobe infiltrate is again identified but mildly improved when compare with the prior study. Some fluid is noted within the minor fissure.  IMPRESSION: Improved aeration in the right base   Electronically Signed   By: Inez Catalina M.D.   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 As above the patient was deemed unresectable secondary pericardial involvement. Mediastinal biopsies were negative however the subcarinal area was hot on PET scan and I am concerned she may have N2 disease. This area would be covered with her anticipated radiation fields. Patient and her family are traveling t [place] tomorrow for a second opinion concerning her treatment. The patient's daughter lives in this area and the patient may proceed with her radiation and radiosensitizing chemotherapy in this region. She will then return [name] later date for consideration for surgery depending on her response to treatment.   PLAN: Tentative simulation March 24 at 9 AM with treatments to begin the week of March 30 along with radiosensitizing chemotherapy, unless she proceeds with treatment in [facility] [place]. Total time spent in the encounter was 65 minutes.    ------------------------------------------------  -----------------------------------  Blair Promise, PhD, MD

## 2013-11-20 NOTE — [facility] Oncology Navigator Note (Unsigned)
   Thoracic Treatment Summary [place] Date:11/20/2013 DOB:10/12/1940 Your Medical Team Medical Oncologist: Dr. Julien Nordmann  Radiation Oncologist: Dr. Sondra Come Surgeon:Dr. Roxan Hockey Type and Stage of Lung Cancer Non-Small Cell Carcinoma:  Squamous Cell Clinical Stage:  IIIA   Clinical stage is based on radiology exams.  Pathological stage will be determined after surgery.  Staging is based on the size of the tumor, involvement of lymph nodes or not, and whether or not the cancer center has spread. Recommendations Recommendations: Concurrent chemoradiation therapy  These recommendations are based on information available as of today's consult.  This is subject to change depending further testing or exams. Next Steps Next Step: 1. Radiation Oncology set up [facility] Tuesday March 24th 2. Medical Oncology set up chemo education class and chemotherapy  Barriers to Care What do you perceive as a potential barrier that may prevent you from receiving your treatment plan? No barriers addressed at this time Resources: NCI booklet on lung cancer given and briefly explained SIM education sheet [facility] information and programs Cancer Care [email or web address] [phone] Questions Norton Blizzard, RN BSN Thoracic Oncology Nurse Navigator at [facility] is a nurse navigator that is available to assist you through your cancer journey.  She can answer your questions and/or provide resources regarding your treatment plan, emotional support, or financial concerns.

## 2013-11-21 ENCOUNTER — Telehealth: Payer: Self-pay | Admitting: Internal Medicine

## 2013-11-21 ENCOUNTER — Telehealth: Payer: Self-pay | Admitting: *Deleted

## 2013-11-21 NOTE — Telephone Encounter (Signed)
Sent medical records to Dr. Dana Allan at the [facility]

## 2013-11-21 NOTE — Telephone Encounter (Signed)
Per staff message and POF I have scheduled appts.  JMW  

## 2013-11-22 ENCOUNTER — Telehealth: Payer: Self-pay | Admitting: Internal Medicine

## 2013-11-22 NOTE — Telephone Encounter (Signed)
Called pt left message for 3/25 advised pt to get appt for  get appt calendar for [facility] ans [facility] , appt mailed to p

## 2013-11-25 ENCOUNTER — Ambulatory Visit
Admission: RE | Admit: 2013-11-25 | Discharge: 2013-11-25 | Disposition: A | Discharge status: Home / Self Care | Payer: Medicare Other | Source: Ambulatory Visit | Attending: Radiation Oncology | Admitting: Radiation Oncology

## 2013-11-25 DIAGNOSIS — Z510 Encounter for antineoplastic radiation therapy: Secondary | ICD-10-CM | POA: Insufficient documentation

## 2013-11-25 DIAGNOSIS — Z79899 Other long term (current) drug therapy: Secondary | ICD-10-CM | POA: Insufficient documentation

## 2013-11-25 DIAGNOSIS — R42 Dizziness and giddiness: Secondary | ICD-10-CM | POA: Insufficient documentation

## 2013-11-25 DIAGNOSIS — R05 Cough: Secondary | ICD-10-CM | POA: Insufficient documentation

## 2013-11-25 DIAGNOSIS — C342 Malignant neoplasm of middle lobe, bronchus or lung: Secondary | ICD-10-CM | POA: Insufficient documentation

## 2013-11-25 DIAGNOSIS — T451X5A Adverse effect of antineoplastic and immunosuppressive drugs, initial encounter: Secondary | ICD-10-CM | POA: Insufficient documentation

## 2013-11-25 DIAGNOSIS — R1310 Dysphagia, unspecified: Secondary | ICD-10-CM | POA: Insufficient documentation

## 2013-11-25 DIAGNOSIS — K5909 Other constipation: Secondary | ICD-10-CM | POA: Insufficient documentation

## 2013-11-25 DIAGNOSIS — R059 Cough, unspecified: Secondary | ICD-10-CM | POA: Insufficient documentation

## 2013-11-25 NOTE — Progress Notes (Signed)
  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  Identity was confirmed.  All relevant records and images related to the planned course of therapy were reviewed.  The patient freely provided informed written consent to proceed with treatment after reviewing the details related to the planned course of therapy. The consent form was witnessed and verified by the simulation staff.  Then, the patient was set-up in a stable reproducible  supine position for radiation therapy.  CT images were obtained.  Surface markings were placed.  The CT images were loaded into the planning software.  Then the target and avoidance structures were contoured.  Treatment planning then occurred.  The radiation prescription was entered and confirmed.  Then, I designed and supervised the construction of a total of 0 medically necessary complex treatment devices.  I have requested : 3D Simulation  I have requested a DVH of the following structures: GTV, PTV, lungs, spinal cord, esophagus.  I have ordered:dose calc.  PLAN:  The patient will receive 50.4 Gy in 28 fractions with consideration for surgery if the patient has a good response to her treatment.  ________________________________   Special treatment procedure note  Patient will be receiving radiosensitizing chemotherapy during her chest radiation treatments. Given the increased potential for toxicities as well as the necessity for close monitoring of the patient and bloodwork, this constitutes a special treatment procedure. -----------------------------------  Blair Promise, PhD, MD

## 2013-11-26 ENCOUNTER — Other Ambulatory Visit: Payer: Medicare Other

## 2013-11-26 ENCOUNTER — Encounter: Payer: Self-pay | Admitting: *Deleted

## 2013-11-26 ENCOUNTER — Telehealth: Payer: Self-pay | Admitting: Internal Medicine

## 2013-11-26 LAB — FUNGUS CULTURE W SMEAR: Fungal Smear: NONE SEEN

## 2013-11-26 NOTE — Telephone Encounter (Signed)
returned pt call adn confirmed appts....done

## 2013-11-27 ENCOUNTER — Other Ambulatory Visit: Payer: Self-pay | Admitting: *Deleted

## 2013-11-27 DIAGNOSIS — C3490 Malignant neoplasm of unspecified part of unspecified bronchus or lung: Secondary | ICD-10-CM

## 2013-11-27 LAB — FUNGUS CULTURE W SMEAR: Fungal Smear: NONE SEEN

## 2013-12-01 ENCOUNTER — Other Ambulatory Visit (HOSPITAL_BASED_OUTPATIENT_CLINIC_OR_DEPARTMENT_OTHER): Payer: Medicare Other

## 2013-12-01 ENCOUNTER — Other Ambulatory Visit: Payer: Self-pay | Admitting: *Deleted

## 2013-12-01 ENCOUNTER — Ambulatory Visit (HOSPITAL_BASED_OUTPATIENT_CLINIC_OR_DEPARTMENT_OTHER): Payer: Medicare Other

## 2013-12-01 ENCOUNTER — Ambulatory Visit
Admission: RE | Admit: 2013-12-01 | Discharge: 2013-12-01 | Disposition: A | Discharge status: Home / Self Care | Payer: Medicare Other | Source: Ambulatory Visit | Attending: Radiation Oncology | Admitting: Radiation Oncology

## 2013-12-01 ENCOUNTER — Other Ambulatory Visit: Payer: Medicare Other

## 2013-12-01 VITALS — BP 123/55 | HR 64 | Temp 98.0°F | Resp 17

## 2013-12-01 DIAGNOSIS — C3490 Malignant neoplasm of unspecified part of unspecified bronchus or lung: Secondary | ICD-10-CM

## 2013-12-01 DIAGNOSIS — C342 Malignant neoplasm of middle lobe, bronchus or lung: Secondary | ICD-10-CM

## 2013-12-01 DIAGNOSIS — Z5111 Encounter for antineoplastic chemotherapy: Secondary | ICD-10-CM

## 2013-12-01 DIAGNOSIS — C3491 Malignant neoplasm of unspecified part of right bronchus or lung: Secondary | ICD-10-CM

## 2013-12-01 LAB — COMPREHENSIVE METABOLIC PANEL (CC13)
ALK PHOS: 137 U/L (ref 40–150)
ALT: 10 U/L (ref 0–55)
AST: 12 U/L (ref 5–34)
Albumin: 3.4 g/dL — ABNORMAL LOW (ref 3.5–5.0)
Anion Gap: 11 mEq/L (ref 3–11)
BILIRUBIN TOTAL: 0.31 mg/dL (ref 0.20–1.20)
BUN: 21 mg/dL (ref 7.0–26.0)
CO2: 27 mEq/L (ref 22–29)
Calcium: 10 mg/dL (ref 8.4–10.4)
Chloride: 99 mEq/L (ref 98–109)
Creatinine: 0.9 mg/dL (ref 0.6–1.1)
GLUCOSE: 107 mg/dL (ref 70–140)
POTASSIUM: 4.6 meq/L (ref 3.5–5.1)
SODIUM: 136 meq/L (ref 136–145)
TOTAL PROTEIN: 7.1 g/dL (ref 6.4–8.3)

## 2013-12-01 LAB — CBC WITH DIFFERENTIAL/PLATELET
BASO%: 0.6 % (ref 0.0–2.0)
Basophils Absolute: 0.1 10*3/uL (ref 0.0–0.1)
EOS%: 3 % (ref 0.0–7.0)
Eosinophils Absolute: 0.4 10*3/uL (ref 0.0–0.5)
HCT: 32.9 % — ABNORMAL LOW (ref 34.8–46.6)
HGB: 10.9 g/dL — ABNORMAL LOW (ref 11.6–15.9)
LYMPH%: 11.7 % — ABNORMAL LOW (ref 14.0–49.7)
MCH: 29.8 pg (ref 25.1–34.0)
MCHC: 33.1 g/dL (ref 31.5–36.0)
MCV: 89.9 fL (ref 79.5–101.0)
MONO#: 1 10*3/uL — ABNORMAL HIGH (ref 0.1–0.9)
MONO%: 7 % (ref 0.0–14.0)
NEUT#: 11.5 10*3/uL — ABNORMAL HIGH (ref 1.5–6.5)
NEUT%: 77.7 % — ABNORMAL HIGH (ref 38.4–76.8)
PLATELETS: 524 10*3/uL — AB (ref 145–400)
RBC: 3.66 10*6/uL — AB (ref 3.70–5.45)
RDW: 12.9 % (ref 11.2–14.5)
WBC: 14.8 10*3/uL — ABNORMAL HIGH (ref 3.9–10.3)
lymph#: 1.7 10*3/uL (ref 0.9–3.3)

## 2013-12-01 MED ORDER — SODIUM CHLORIDE 0.9 % IV SOLN
Freq: Once | INTRAVENOUS | Status: AC
  Administered 2013-12-01: 10:00:00 via INTRAVENOUS

## 2013-12-01 MED ORDER — FAMOTIDINE IN NACL 20-0.9 MG/50ML-% IV SOLN
INTRAVENOUS | Status: AC
  Filled 2013-12-01: qty 50

## 2013-12-01 MED ORDER — ONDANSETRON 16 MG/50ML IVPB (CHCC)
16.0000 mg | Freq: Once | INTRAVENOUS | Status: AC
  Administered 2013-12-01: 16 mg via INTRAVENOUS

## 2013-12-01 MED ORDER — DEXAMETHASONE SODIUM PHOSPHATE 20 MG/5ML IJ SOLN
20.0000 mg | Freq: Once | INTRAMUSCULAR | Status: AC
  Administered 2013-12-01: 20 mg via INTRAVENOUS

## 2013-12-01 MED ORDER — ONDANSETRON 16 MG/50ML IVPB (CHCC)
INTRAVENOUS | Status: AC
  Filled 2013-12-01: qty 16

## 2013-12-01 MED ORDER — PROCHLORPERAZINE MALEATE 10 MG PO TABS: 10.0000 mg | ORAL_TABLET | Freq: Four times a day (QID) | ORAL | Status: DC | PRN

## 2013-12-01 MED ORDER — DEXAMETHASONE SODIUM PHOSPHATE 20 MG/5ML IJ SOLN
INTRAMUSCULAR | Status: AC
  Filled 2013-12-01: qty 5

## 2013-12-01 MED ORDER — SODIUM CHLORIDE 0.9 % IV SOLN
172.2000 mg | Freq: Once | INTRAVENOUS | Status: AC
  Administered 2013-12-01: 170 mg via INTRAVENOUS
  Filled 2013-12-01: qty 17

## 2013-12-01 MED ORDER — DIPHENHYDRAMINE HCL 50 MG/ML IJ SOLN
50.0000 mg | Freq: Once | INTRAMUSCULAR | Status: AC
  Administered 2013-12-01: 50 mg via INTRAVENOUS

## 2013-12-01 MED ORDER — FAMOTIDINE IN NACL 20-0.9 MG/50ML-% IV SOLN
20.0000 mg | Freq: Once | INTRAVENOUS | Status: AC
  Administered 2013-12-01: 20 mg via INTRAVENOUS

## 2013-12-01 MED ORDER — DIPHENHYDRAMINE HCL 50 MG/ML IJ SOLN
INTRAMUSCULAR | Status: AC
  Filled 2013-12-01: qty 1

## 2013-12-01 MED ORDER — PACLITAXEL CHEMO INJECTION 300 MG/50ML
45.0000 mg/m2 | Freq: Once | INTRAVENOUS | Status: AC
  Administered 2013-12-01: 78 mg via INTRAVENOUS
  Filled 2013-12-01: qty 13

## 2013-12-01 NOTE — Progress Notes (Signed)
Consent obtained from patient for taxol/carbo today. Obtained CMET with PIV start, however, sample hemolyzed. Explained to patient the delay, patient was very understanding of delay. Explained to report any s/s reaction, voices understanding. Results shown at 1115, labs within parameters for treatment.  1210, 1215, 1220, 1225 (1st 15 mins)- Taxol rate started at 65 ml/hr x 16 ml. VS/patient stable.  1225- rate increased to 131 ml/hr x 33 ml, VS/patient stable.  1240- rate increased to 197 ml/hr x 49 ml, VS/patient stable. 1255- Patient at goal rate of 263 ml/hr x 65, then remainder of volume. No complaints, VS stable

## 2013-12-01 NOTE — Patient Instructions (Addendum)
[facility] Discharge Instructions for Patients Receiving Chemotherapy  Today you received the following chemotherapy agents: Taxol, Carboplatin  To help prevent nausea and vomiting after your treatment, we encourage you to take your nausea medication as prescribed by your physician: Compazine 10 mg every 6 hrs as needed for nausea. TAKE ONE DOSE TONIGHT REGARDLESS IF YOU ARE NAUSEATED OR NOT.    If you develop nausea and vomiting that is not controlled by your nausea medication, call the clinic.   BELOW ARE SYMPTOMS THAT SHOULD BE REPORTED IMMEDIATELY:  *FEVER GREATER THAN 100.5 F  *CHILLS WITH OR WITHOUT FEVER  NAUSEA AND VOMITING THAT IS NOT CONTROLLED WITH YOUR NAUSEA MEDICATION  *UNUSUAL SHORTNESS OF BREATH  *UNUSUAL BRUISING OR BLEEDING  TENDERNESS IN MOUTH AND THROAT WITH OR WITHOUT PRESENCE OF ULCERS  *URINARY PROBLEMS  *BOWEL PROBLEMS  UNUSUAL RASH Items with * indicate a potential emergency and should be followed up as soon as possible.  Feel free to call the clinic you have any questions or concerns. The clinic phone number is [phone].  Paclitaxel injection (TAXOL) What is this medicine? PACLITAXEL (PAK li TAX el) is a chemotherapy drug. It targets fast dividing cells, like cancer cells, and causes these cells to die. This medicine is used to treat ovarian cancer, breast cancer, and other cancers. This medicine may be used for other purposes; ask your health care provider or pharmacist if you have questions. COMMON BRAND NAME(S): Onxol , Taxol What should I tell my health care provider before I take this medicine? They need to know if you have any of these conditions: -blood disorders -irregular heartbeat -infection (especially a virus infection such as chickenpox, cold sores, or herpes) -liver disease -previous or ongoing radiation therapy -an unusual or allergic reaction to paclitaxel, alcohol, polyoxyethylated castor oil, other  chemotherapy agents, other medicines, foods, dyes, or preservatives -pregnant or trying to get pregnant -breast-feeding How should I use this medicine? This drug is given as an infusion into a vein. It is administered in a hospital or clinic by a specially trained health care professional. Talk to your pediatrician regarding the use of this medicine in children. Special care may be needed. Overdosage: If you think you have taken too much of this medicine contact a poison control center or emergency room at once. NOTE: This medicine is only for you. Do not share this medicine with others. What if I miss a dose? It is important not to miss your dose. Call your doctor or health care professional if you are unable to keep an appointment. What may interact with this medicine? Do not take this medicine with any of the following medications: -disulfiram -metronidazole This medicine may also interact with the following medications: -cyclosporine -diazepam -ketoconazole -medicines to increase blood counts like filgrastim, pegfilgrastim, sargramostim -other chemotherapy drugs like cisplatin, doxorubicin, epirubicin, etoposide, teniposide, vincristine -quinidine -testosterone -vaccines -verapamil Talk to your doctor or health care professional before taking any of these medicines: -acetaminophen -aspirin -ibuprofen -ketoprofen -naproxen This list may not describe all possible interactions. Give your health care provider a list of all the medicines, herbs, non-prescription drugs, or dietary supplements you use. Also tell them if you smoke, drink alcohol, or use illegal drugs. Some items may interact with your medicine. What should I watch for while using this medicine? Your condition will be monitored carefully while you are receiving this medicine. You will need important blood work done while you are taking this medicine. This drug may make you  feel generally unwell. This is not uncommon, as  chemotherapy can affect healthy cells as well as cancer cells. Report any side effects. Continue your course of treatment even though you feel ill unless your doctor tells you to stop. In some cases, you may be given additional medicines to help with side effects. Follow all directions for their use. Call your doctor or health care professional for advice if you get a fever, chills or sore throat, or other symptoms of a cold or flu. Do not treat yourself. This drug decreases your body's ability to fight infections. Try to avoid being around people who are sick. This medicine may increase your risk to bruise or bleed. Call your doctor or health care professional if you notice any unusual bleeding. Be careful brushing and flossing your teeth or using a toothpick because you may get an infection or bleed more easily. If you have any dental work done, tell your dentist you are receiving this medicine. Avoid taking products that contain aspirin, acetaminophen, ibuprofen, naproxen, or ketoprofen unless instructed by your doctor. These medicines may hide a fever. Do not become pregnant while taking this medicine. Women should inform their doctor if they wish to become pregnant or think they might be pregnant. There is a potential for serious side effects to an unborn child. Talk to your health care professional or pharmacist for more information. Do not breast-feed an infant while taking this medicine. Men are advised not to father a child while receiving this medicine. What side effects may I notice from receiving this medicine? Side effects that you should report to your doctor or health care professional as soon as possible: -allergic reactions like skin rash, itching or hives, swelling of the face, lips, or tongue -low blood counts - This drug may decrease the number of white blood cells, red blood cells and platelets. You may be at increased risk for infections and bleeding. -signs of infection - fever or  chills, cough, sore throat, pain or difficulty passing urine -signs of decreased platelets or bleeding - bruising, pinpoint red spots on the skin, black, tarry stools, nosebleeds -signs of decreased red blood cells - unusually weak or tired, fainting spells, lightheadedness -breathing problems -chest pain -high or low blood pressure -mouth sores -nausea and vomiting -pain, swelling, redness or irritation at the injection site -pain, tingling, numbness in the hands or feet -slow or irregular heartbeat -swelling of the ankle, feet, hands Side effects that usually do not require medical attention (report to your doctor or health care professional if they continue or are bothersome): -bone pain -complete hair loss including hair on your head, underarms, pubic hair, eyebrows, and eyelashes -changes in the color of fingernails -diarrhea -loosening of the fingernails -loss of appetite -muscle or joint pain -red flush to skin -sweating This list may not describe all possible side effects. Call your doctor for medical advice about side effects. You may report side effects to FDA at 1-800-FDA-1088. Where should I keep my medicine? This drug is given in a hospital or clinic and will not be stored at home. NOTE: This sheet is a summary. It may not cover all possible information. If you have questions about this medicine, talk to your doctor, pharmacist, or health care provider.  2014, Elsevier/Gold Standard. (2012-10-14 16:41:21)   Carboplatin injection (CARBOPLATIN) What is this medicine? CARBOPLATIN (KAR boe pla tin) is a chemotherapy drug. It targets fast dividing cells, like cancer cells, and causes these cells to die. This medicine is used  to treat ovarian cancer and many other cancers. This medicine may be used for other purposes; ask your health care provider or pharmacist if you have questions. COMMON BRAND NAME(S): Paraplatin What should I tell my health care provider before I take  this medicine? They need to know if you have any of these conditions: -blood disorders -hearing problems -kidney disease -recent or ongoing radiation therapy -an unusual or allergic reaction to carboplatin, cisplatin, other chemotherapy, other medicines, foods, dyes, or preservatives -pregnant or trying to get pregnant -breast-feeding How should I use this medicine? This drug is usually given as an infusion into a vein. It is administered in a hospital or clinic by a specially trained health care professional. Talk to your pediatrician regarding the use of this medicine in children. Special care may be needed. Overdosage: If you think you have taken too much of this medicine contact a poison control center or emergency room at once. NOTE: This medicine is only for you. Do not share this medicine with others. What if I miss a dose? It is important not to miss a dose. Call your doctor or health care professional if you are unable to keep an appointment. What may interact with this medicine? -medicines for seizures -medicines to increase blood counts like filgrastim, pegfilgrastim, sargramostim -some antibiotics like amikacin, gentamicin, neomycin, streptomycin, tobramycin -vaccines Talk to your doctor or health care professional before taking any of these medicines: -acetaminophen -aspirin -ibuprofen -ketoprofen -naproxen This list may not describe all possible interactions. Give your health care provider a list of all the medicines, herbs, non-prescription drugs, or dietary supplements you use. Also tell them if you smoke, drink alcohol, or use illegal drugs. Some items may interact with your medicine. What should I watch for while using this medicine? Your condition will be monitored carefully while you are receiving this medicine. You will need important blood work done while you are taking this medicine. This drug may make you feel generally unwell. This is not uncommon, as  chemotherapy can affect healthy cells as well as cancer cells. Report any side effects. Continue your course of treatment even though you feel ill unless your doctor tells you to stop. In some cases, you may be given additional medicines to help with side effects. Follow all directions for their use. Call your doctor or health care professional for advice if you get a fever, chills or sore throat, or other symptoms of a cold or flu. Do not treat yourself. This drug decreases your body's ability to fight infections. Try to avoid being around people who are sick. This medicine may increase your risk to bruise or bleed. Call your doctor or health care professional if you notice any unusual bleeding. Be careful brushing and flossing your teeth or using a toothpick because you may get an infection or bleed more easily. If you have any dental work done, tell your dentist you are receiving this medicine. Avoid taking products that contain aspirin, acetaminophen, ibuprofen, naproxen, or ketoprofen unless instructed by your doctor. These medicines may hide a fever. Do not become pregnant while taking this medicine. Women should inform their doctor if they wish to become pregnant or think they might be pregnant. There is a potential for serious side effects to an unborn child. Talk to your health care professional or pharmacist for more information. Do not breast-feed an infant while taking this medicine. What side effects may I notice from receiving this medicine? Side effects that you should report to your doctor  or health care professional as soon as possible: -allergic reactions like skin rash, itching or hives, swelling of the face, lips, or tongue -signs of infection - fever or chills, cough, sore throat, pain or difficulty passing urine -signs of decreased platelets or bleeding - bruising, pinpoint red spots on the skin, black, tarry stools, nosebleeds -signs of decreased red blood cells - unusually weak or  tired, fainting spells, lightheadedness -breathing problems -changes in hearing -changes in vision -chest pain -high blood pressure -low blood counts - This drug may decrease the number of white blood cells, red blood cells and platelets. You may be at increased risk for infections and bleeding. -nausea and vomiting -pain, swelling, redness or irritation at the injection site -pain, tingling, numbness in the hands or feet -problems with balance, talking, walking -trouble passing urine or change in the amount of urine Side effects that usually do not require medical attention (report to your doctor or health care professional if they continue or are bothersome): -hair loss -loss of appetite -metallic taste in the mouth or changes in taste This list may not describe all possible side effects. Call your doctor for medical advice about side effects. You may report side effects to FDA at 1-800-FDA-1088. Where should I keep my medicine? This drug is given in a hospital or clinic and will not be stored at home. NOTE: This sheet is a summary. It may not cover all possible information. If you have questions about this medicine, talk to your doctor, pharmacist, or health care provider.  2014, Elsevier/Gold Standard. (2007-11-26 14:38:05)

## 2013-12-01 NOTE — Progress Notes (Signed)
  Radiation Oncology         [phone] ________________________________  Name: Kimberly [name] MRN: [id number]  Date: 12/01/2013  DOB: 1941-02-20  Simulation Verification Note  Status: outpatient  NARRATIVE: The patient was brought to the treatment unit and placed in the planned treatment position. The clinical setup was verified. Then port films were obtained and uploaded to the radiation oncology medical record software.  The treatment beams were carefully compared against the planned radiation fields. The position location and shape of the radiation fields was reviewed. They targeted volume of tissue appears to be appropriately covered by the radiation beams. Organs at risk appear to be excluded as planned.  Based on my personal review, I approved the simulation verification. The patient's treatment will proceed as planned.  -----------------------------------  Blair Promise, PhD, MD

## 2013-12-02 ENCOUNTER — Ambulatory Visit
Admission: RE | Admit: 2013-12-02 | Discharge: 2013-12-02 | Disposition: A | Discharge status: Home / Self Care | Payer: Medicare Other | Source: Ambulatory Visit | Attending: Radiation Oncology | Admitting: Radiation Oncology

## 2013-12-02 ENCOUNTER — Ambulatory Visit
Admission: RE | Admit: 2013-12-02 | Discharge: 2013-12-02 | Disposition: A | Discharge status: Home / Self Care | Payer: Medicare Other | Source: Ambulatory Visit | Attending: Thoracic Surgery (Cardiothoracic Vascular Surgery) | Admitting: Thoracic Surgery (Cardiothoracic Vascular Surgery)

## 2013-12-02 ENCOUNTER — Ambulatory Visit (INDEPENDENT_AMBULATORY_CARE_PROVIDER_SITE_OTHER): Payer: Medicare Other | Admitting: Thoracic Surgery (Cardiothoracic Vascular Surgery)

## 2013-12-02 ENCOUNTER — Encounter: Payer: Self-pay | Admitting: Thoracic Surgery (Cardiothoracic Vascular Surgery)

## 2013-12-02 VITALS — BP 115/60 | HR 67 | Temp 98.2°F | Ht 60.0 in | Wt 165.9 lb

## 2013-12-02 VITALS — BP 135/67 | HR 73 | Resp 20 | Ht 60.0 in | Wt 169.0 lb

## 2013-12-02 DIAGNOSIS — C3490 Malignant neoplasm of unspecified part of unspecified bronchus or lung: Secondary | ICD-10-CM

## 2013-12-02 DIAGNOSIS — Z09 Encounter for follow-up examination after completed treatment for conditions other than malignant neoplasm: Secondary | ICD-10-CM

## 2013-12-02 DIAGNOSIS — C342 Malignant neoplasm of middle lobe, bronchus or lung: Secondary | ICD-10-CM

## 2013-12-02 DIAGNOSIS — Z9889 Other specified postprocedural states: Secondary | ICD-10-CM

## 2013-12-02 MED ORDER — RADIAPLEXRX EX GEL
Freq: Once | CUTANEOUS | Status: AC
  Administered 2013-12-02: 16:00:00 via TOPICAL

## 2013-12-02 MED ORDER — HYDROCOD POLST-CHLORPHEN POLST 10-8 MG/5ML PO LQCR
5.0000 mL | Freq: Every evening | ORAL | Status: DC | PRN
Start: 2013-12-02 — End: 2014-01-10

## 2013-12-02 MED ORDER — SUCRALFATE 1 GM/10ML PO SUSP: 1.0000 g | Freq: Three times a day (TID) | ORAL | Status: DC

## 2013-12-02 NOTE — Progress Notes (Signed)
HPI:  Mrs. Pichette returns today for a scheduled postoperative followup visit.  She had an exploratory thoracoscopy on March 12 for a right middle lobe mass. This mass was invading the mediastinum and was unresectable. She has just started chemotherapy and radiation therapy. That is going well so far. She has minimal discomfort from her incision. She is hoping this will eventually be able to get back for a resection after the neoadjuvant treatment.  Past Medical History  Diagnosis Date  . Hypertension   . Thyroid disease   . GERD (gastroesophageal reflux disease)   . Cancer   . Dyslipidemia   . Aortic insufficiency   . Mitral insufficiency   . History of nuclear stress test 02/26/2006    exercise myoview; normal pattern of perfusion; low risk scan   . PONV (postoperative nausea and vomiting)   . Heart murmur   . Hypothyroidism   . Cough     dry, endobronchial mass  . Pneumonia     pus bronchitis  . Arthritis   . Syncope     "states she has passed out a few times. dr is trying to find cause.last time when geeting ready to go home after video bronch/bx  . Shortness of breath     with exertion  . Anxiety     due to surgery   . Bronchitis       Current Outpatient Prescriptions  Medication Sig Dispense Refill  . amLODipine (NORVASC) 10 MG tablet Take 5 mg by mouth daily.       [name] atorvastatin (LIPITOR) 20 MG tablet Take 20 mg by mouth daily.      . Cholecalciferol (VITAMIN D-3) 1000 UNITS CAPS Take 1 capsule by mouth daily.       [name] EPINEPHrine (EPIPEN IJ) Inject 1 each as directed once as needed (for reaction to milk products).      . irbesartan-hydrochlorothiazide (AVALIDE) 300-12.5 MG per tablet Take 1 tablet by mouth daily.      [name] levothyroxine (SYNTHROID, LEVOTHROID) 125 MCG tablet Take 125 mcg by mouth daily before breakfast.      . metoprolol succinate (TOPROL-XL) 25 MG 24 hr tablet Take 12.5 mg by mouth daily.      . montelukast (SINGULAIR) 10 MG tablet Take 10 mg by  mouth daily.      . Multiple Vitamins-Minerals (MULTIVITAMIN WITH MINERALS) tablet Take 1 tablet by mouth daily.      . pantoprazole (PROTONIX) 40 MG tablet Take 40 mg by mouth 2 (two) times daily.       . prochlorperazine (COMPAZINE) 10 MG tablet Take 1 tablet (10 mg total) by mouth every 6 (six) hours as needed for nausea or vomiting.  30 tablet  1  . ranitidine (ZANTAC) 300 MG tablet Take 300 mg by mouth at bedtime.      . SYMBICORT 160-4.5 MCG/ACT inhaler       . traMADol (ULTRAM) 50 MG tablet Take 25 mg by mouth every 6 (six) hours as needed for moderate pain.       No current facility-administered medications for this visit.    Physical Exam BP 135/67  Pulse 73  Resp 20  Ht 5' (1.524 m)  Wt 169 lb (76.658 kg)  BMI 33.01 kg/m2  SpO20 79% 73 year old woman in no acute distress Alert and oriented x3 Lungs clear with equal breath sounds bilaterally  Diagnostic Tests: CHEST 2 VIEW  COMPARISON: Prior radiograph from 11/15/2013  FINDINGS:  The cardiac and mediastinal silhouettes are  stable in size and  contour, and remain within normal limits. Previously seen right IJ  central venous catheter has been removed.  The lungs are normally inflated. There has been interval improvement  in the aeration of the right lung base as compared to prior. Small  amount of fluid and/or atelectasis persists along the right minor  fissure. No pneumothorax. The left lung is clear. Pulmonary  vascularity is normal.  No acute osseous abnormality identified.  IMPRESSION:  Interval improvement in aeration of the right lung base. No new  acute cardiopulmonary abnormality identified.  Electronically Signed  By: Jeannine Boga M.D.  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 We attempted to resect her there were unable to because of local invasion of the pericardium. She has recovered well from surgery.  She is started chemotherapy  and radiation. I do think it would be reasonable to consider surgical resection if she has a good response to treatment, although there would be no guarantee that the lesion would be resectable.

## 2013-12-02 NOTE — Progress Notes (Signed)
Kimberly [name] has had 2 fractions to her right chest.  She denies pain.  She reports coughing at night and is wondering if she can get hydrocodone cough syrup. She says that is the only medication that has helped so far.  She denies hemotpsis.  She reports shortness of breath when walking up hills.  Her o2 saturation today was 100% on room air.  She was given the Radiation Therapy and You book and discussed potential side effects/management of fatigue, skin changes and throat changes.  She was given radiaplex gel and was instructed to apply it to her right chest/back twice a day after treatment and at bedtime.

## 2013-12-02 NOTE — Progress Notes (Signed)
[facility]     Kimberly [name], M.D. [place], [place] [address]               Kimberly [name], M.D., Ph.D. Phone: [phone]      Kimberly [name], M.D. Fax: [phone]      Kimberly [name], M.D., Ph.D.         Kimberly [name], M.D.         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 The chart was checked and CBCT  were reviewed. She is tolerating the treatments well at this time. she does have trouble sleeping at night secondary to excessive coughing. I have refilled her Tussionex light of this issue. She has been given Carafate suspension to start using in addition.  Milk-related compounds Current Outpatient Prescriptions  Medication Sig Dispense Refill  . amLODipine (NORVASC) 10 MG tablet Take 5 mg by mouth daily.       [name] atorvastatin (LIPITOR) 20 MG tablet Take 20 mg by mouth daily.      . Cholecalciferol (VITAMIN D-3) 1000 UNITS CAPS Take 1 capsule by mouth daily.       [name] EPINEPHrine (EPIPEN IJ) Inject 1 each as directed once as needed (for reaction to milk products).      . irbesartan-hydrochlorothiazide (AVALIDE) 300-12.5 MG per tablet Take 1 tablet by mouth daily.      [name] levothyroxine (SYNTHROID, LEVOTHROID) 125 MCG tablet Take 125 mcg by mouth daily before breakfast.      . metoprolol succinate (TOPROL-XL) 25 MG 24 hr tablet Take 12.5 mg by mouth daily.      . montelukast (SINGULAIR) 10 MG tablet Take 10 mg by mouth daily.      . Multiple Vitamins-Minerals (MULTIVITAMIN WITH MINERALS) tablet Take 1 tablet  by mouth daily.      . pantoprazole (PROTONIX) 40 MG tablet Take 40 mg by mouth 2 (two) times daily.       . prochlorperazine (COMPAZINE) 10 MG tablet Take 1 tablet (10 mg total) by mouth every 6 (six) hours as needed for nausea or vomiting.  30 tablet  1  . ranitidine (ZANTAC) 300 MG tablet Take 300 mg by mouth at bedtime.      . SYMBICORT 160-4.5 MCG/ACT inhaler       . traMADol (ULTRAM) 50 MG tablet Take 25 mg by mouth every 6 (six) hours as needed for moderate pain.      . chlorpheniramine-HYDROcodone (TUSSIONEX) 10-8 MG/5ML LQCR Take 5 mLs by mouth at bedtime as needed for cough.  115 mL  0  . hyaluronate sodium (RADIAPLEXRX) GEL Apply 1 application topically 2 (two) times daily.      . sucralfate (CARAFATE) 1 GM/10ML suspension Take 10 mLs (1 g total) by mouth 4 (four) times daily -  with meals and at bedtime.  420 mL  0   No current facility-administered medications for this encounter.  Labs:  Lab Results  Component Value Date   WBC 14.8* 12/01/2013   HGB 10.9* 12/01/2013   HCT 32.9* 12/01/2013   MCV 89.9 12/01/2013   PLT 524* 12/01/2013   Lab Results  Component Value Date   CREATININE 0.9 12/01/2013   BUN 21.0 12/01/2013   NA 136 12/01/2013   K 4.6 12/01/2013   CL 100 11/15/2013   CO2 27 12/01/2013   Lab Results  Component Value Date   ALT 10 12/01/2013   AST 12 12/01/2013   BILITOT 0.31 12/01/2013    Physical Examination:  Filed Vitals:   12/02/13 1535  BP: 115/60  Pulse: 67  Temp: 98.2 F (36.8 C)    Wt Readings from Last 3 Encounters:  12/02/13 165 lb 14.4 oz (75.252 kg)  12/02/13 169 lb (76.658 kg)  11/20/13 162 lb 14.4 oz (73.891 kg)     Lungs - Normal respiratory effort, chest expands symmetrically. Lungs are clear to auscultation, no crackles or wheezes  Heart has regular rhythm and rate  Abdomen is soft and non tender with normal bowel sounds  Assessment:  Patient tolerating treatments well  Plan: Continue treatment per original radiation prescription

## 2013-12-03 ENCOUNTER — Telehealth: Payer: Self-pay | Admitting: *Deleted

## 2013-12-03 ENCOUNTER — Ambulatory Visit
Admission: RE | Admit: 2013-12-03 | Discharge: 2013-12-03 | Disposition: A | Discharge status: Home / Self Care | Payer: Medicare Other | Source: Ambulatory Visit | Attending: Radiation Oncology | Admitting: Radiation Oncology

## 2013-12-03 NOTE — Telephone Encounter (Signed)
No adverse event from chemo and had no questions or concerns.

## 2013-12-04 ENCOUNTER — Ambulatory Visit
Admission: RE | Admit: 2013-12-04 | Discharge: 2013-12-04 | Disposition: A | Discharge status: Home / Self Care | Payer: Medicare Other | Source: Ambulatory Visit | Attending: Radiation Oncology | Admitting: Radiation Oncology

## 2013-12-05 ENCOUNTER — Ambulatory Visit
Admission: RE | Admit: 2013-12-05 | Discharge: 2013-12-05 | Disposition: A | Discharge status: Home / Self Care | Payer: Medicare Other | Source: Ambulatory Visit | Attending: Radiation Oncology | Admitting: Radiation Oncology

## 2013-12-08 ENCOUNTER — Other Ambulatory Visit (HOSPITAL_BASED_OUTPATIENT_CLINIC_OR_DEPARTMENT_OTHER): Payer: Medicare Other

## 2013-12-08 ENCOUNTER — Encounter: Payer: Self-pay | Admitting: Internal Medicine

## 2013-12-08 ENCOUNTER — Ambulatory Visit (HOSPITAL_BASED_OUTPATIENT_CLINIC_OR_DEPARTMENT_OTHER): Payer: Medicare Other

## 2013-12-08 ENCOUNTER — Ambulatory Visit
Admission: RE | Admit: 2013-12-08 | Discharge: 2013-12-08 | Disposition: A | Discharge status: Home / Self Care | Payer: Medicare Other | Source: Ambulatory Visit | Attending: Radiation Oncology | Admitting: Radiation Oncology

## 2013-12-08 ENCOUNTER — Ambulatory Visit (HOSPITAL_BASED_OUTPATIENT_CLINIC_OR_DEPARTMENT_OTHER): Payer: Medicare Other | Admitting: Internal Medicine

## 2013-12-08 VITALS — BP 119/58 | HR 79 | Temp 99.1°F | Resp 18 | Ht 60.0 in | Wt 162.2 lb

## 2013-12-08 DIAGNOSIS — C3491 Malignant neoplasm of unspecified part of right bronchus or lung: Secondary | ICD-10-CM

## 2013-12-08 DIAGNOSIS — C342 Malignant neoplasm of middle lobe, bronchus or lung: Secondary | ICD-10-CM

## 2013-12-08 DIAGNOSIS — Z5111 Encounter for antineoplastic chemotherapy: Secondary | ICD-10-CM

## 2013-12-08 LAB — COMPREHENSIVE METABOLIC PANEL (CC13)
ALBUMIN: 3.4 g/dL — AB (ref 3.5–5.0)
ALT: 11 U/L (ref 0–55)
ANION GAP: 12 meq/L — AB (ref 3–11)
AST: 14 U/L (ref 5–34)
Alkaline Phosphatase: 111 U/L (ref 40–150)
BUN: 22 mg/dL (ref 7.0–26.0)
CALCIUM: 10.2 mg/dL (ref 8.4–10.4)
CHLORIDE: 97 meq/L — AB (ref 98–109)
CO2: 26 meq/L (ref 22–29)
CREATININE: 1 mg/dL (ref 0.6–1.1)
GLUCOSE: 117 mg/dL (ref 70–140)
Potassium: 4.5 mEq/L (ref 3.5–5.1)
Sodium: 135 mEq/L — ABNORMAL LOW (ref 136–145)
Total Bilirubin: 0.34 mg/dL (ref 0.20–1.20)
Total Protein: 7.3 g/dL (ref 6.4–8.3)

## 2013-12-08 LAB — CBC WITH DIFFERENTIAL/PLATELET
BASO%: 0.5 % (ref 0.0–2.0)
Basophils Absolute: 0.1 10*3/uL (ref 0.0–0.1)
EOS%: 5.7 % (ref 0.0–7.0)
Eosinophils Absolute: 0.6 10*3/uL — ABNORMAL HIGH (ref 0.0–0.5)
HEMATOCRIT: 31.3 % — AB (ref 34.8–46.6)
HGB: 10.3 g/dL — ABNORMAL LOW (ref 11.6–15.9)
LYMPH%: 10.3 % — ABNORMAL LOW (ref 14.0–49.7)
MCH: 29.4 pg (ref 25.1–34.0)
MCHC: 32.9 g/dL (ref 31.5–36.0)
MCV: 89.4 fL (ref 79.5–101.0)
MONO#: 0.4 10*3/uL (ref 0.1–0.9)
MONO%: 4.4 % (ref 0.0–14.0)
NEUT%: 79.1 % — AB (ref 38.4–76.8)
NEUTROS ABS: 8 10*3/uL — AB (ref 1.5–6.5)
Platelets: 452 10*3/uL — ABNORMAL HIGH (ref 145–400)
RBC: 3.5 10*6/uL — ABNORMAL LOW (ref 3.70–5.45)
RDW: 12.6 % (ref 11.2–14.5)
WBC: 10.1 10*3/uL (ref 3.9–10.3)
lymph#: 1 10*3/uL (ref 0.9–3.3)
nRBC: 0 % (ref 0–0)

## 2013-12-08 MED ORDER — DIPHENHYDRAMINE HCL 50 MG/ML IJ SOLN
50.0000 mg | Freq: Once | INTRAMUSCULAR | Status: AC
  Administered 2013-12-08: 50 mg via INTRAVENOUS

## 2013-12-08 MED ORDER — DEXAMETHASONE SODIUM PHOSPHATE 20 MG/5ML IJ SOLN
20.0000 mg | Freq: Once | INTRAMUSCULAR | Status: AC
  Administered 2013-12-08: 20 mg via INTRAVENOUS

## 2013-12-08 MED ORDER — SODIUM CHLORIDE 0.9 % IV SOLN
Freq: Once | INTRAVENOUS | Status: AC
  Administered 2013-12-08: 13:00:00 via INTRAVENOUS

## 2013-12-08 MED ORDER — DIPHENHYDRAMINE HCL 50 MG/ML IJ SOLN
INTRAMUSCULAR | Status: AC
  Filled 2013-12-08: qty 1

## 2013-12-08 MED ORDER — SODIUM CHLORIDE 0.9 % IV SOLN
45.0000 mg/m2 | Freq: Once | INTRAVENOUS | Status: AC
  Administered 2013-12-08: 78 mg via INTRAVENOUS
  Filled 2013-12-08: qty 13

## 2013-12-08 MED ORDER — DEXAMETHASONE SODIUM PHOSPHATE 20 MG/5ML IJ SOLN
INTRAMUSCULAR | Status: AC
  Filled 2013-12-08: qty 5

## 2013-12-08 MED ORDER — SODIUM CHLORIDE 0.9 % IV SOLN
172.2000 mg | Freq: Once | INTRAVENOUS | Status: AC
  Administered 2013-12-08: 170 mg via INTRAVENOUS
  Filled 2013-12-08: qty 17

## 2013-12-08 MED ORDER — FAMOTIDINE IN NACL 20-0.9 MG/50ML-% IV SOLN
INTRAVENOUS | Status: AC
  Filled 2013-12-08: qty 50

## 2013-12-08 MED ORDER — FAMOTIDINE IN NACL 20-0.9 MG/50ML-% IV SOLN
20.0000 mg | Freq: Once | INTRAVENOUS | Status: AC
  Administered 2013-12-08: 20 mg via INTRAVENOUS

## 2013-12-08 MED ORDER — ONDANSETRON 16 MG/50ML IVPB (CHCC)
INTRAVENOUS | Status: AC
  Filled 2013-12-08: qty 16

## 2013-12-08 MED ORDER — ONDANSETRON 16 MG/50ML IVPB (CHCC)
16.0000 mg | Freq: Once | INTRAVENOUS | Status: AC
  Administered 2013-12-08: 16 mg via INTRAVENOUS

## 2013-12-08 NOTE — Progress Notes (Signed)
[facility] Telephone:([phone]) [phone]   Fax:(336) [phone]  OFFICE PROGRESS NOTE  Kimberly Pel, MD [address] [place] [address]  DIAGNOSIS: Unresectable, likely a stage IIIA (T2a, N2, M0) non-small cell lung cancer, squamous cell carcinoma diagnosed in February 2015.  PRIOR THERAPY: On 11/13/2013 the patient underwent exploratory right VATS with mediastinal biopsy under the care of Dr. Roxan Hockey.  CURRENT THERAPY: Concurrent chemoradiation with weekly carboplatin for AUC of 2 and paclitaxel 45 mg/M2, status post 1 cycle. First cycle was given on 12/01/2013.  INTERVAL HISTORY: Kimberly [name] 73 y.o. female returns to the clinic today for accompanied by her daughter [name]. The patient related the first week of her treatment fairly well except for increasing fatigue and chest congestion. She also continues to have dry cough. She is currently on Hycodan for cough with minimal improvement. She denied having any significant nausea or vomiting, no fever or chills. She denied having any chest pain but continues to have shortness of breath with exertion with no hemoptysis. She is here today to start cycle #2 of her chemotherapy.  MEDICAL HISTORY: Past Medical History  Diagnosis Date  . Hypertension   . Thyroid disease   . GERD (gastroesophageal reflux disease)   . Cancer   . Dyslipidemia   . Aortic insufficiency   . Mitral insufficiency   . History of nuclear stress test 02/26/2006    exercise myoview; normal pattern of perfusion; low risk scan   . PONV (postoperative nausea and vomiting)   . Heart murmur   . Hypothyroidism   . Cough     dry, endobronchial mass  . Pneumonia     pus bronchitis  . Arthritis   . Syncope     "states she has passed out a few times. dr is trying to find cause.last time when geeting ready to go home after video bronch/bx  . Shortness of breath     with exertion  . Anxiety     due to surgery   .  Bronchitis     ALLERGIES:  is allergic to milk-related compounds.  MEDICATIONS:  Current Outpatient Prescriptions  Medication Sig Dispense Refill  . amLODipine (NORVASC) 10 MG tablet Take 5 mg by mouth daily.       [name] atorvastatin (LIPITOR) 20 MG tablet Take 20 mg by mouth daily.      . chlorpheniramine-HYDROcodone (TUSSIONEX) 10-8 MG/5ML LQCR Take 5 mLs by mouth at bedtime as needed for cough.  115 mL  0  . EPINEPHrine (EPIPEN IJ) Inject 1 each as directed once as needed (for reaction to milk products).      . hyaluronate sodium (RADIAPLEXRX) GEL Apply 1 application topically 2 (two) times daily.      . irbesartan-hydrochlorothiazide (AVALIDE) 300-12.5 MG per tablet Take 1 tablet by mouth daily.      [name] levothyroxine (SYNTHROID, LEVOTHROID) 125 MCG tablet Take 125 mcg by mouth daily before breakfast.      . metoprolol succinate (TOPROL-XL) 25 MG 24 hr tablet Take 12.5 mg by mouth daily.      . montelukast (SINGULAIR) 10 MG tablet Take 10 mg by mouth daily.      . pantoprazole (PROTONIX) 40 MG tablet Take 40 mg by mouth 2 (two) times daily.       . prochlorperazine (COMPAZINE) 10 MG tablet Take 1 tablet (10 mg total) by mouth every 6 (six) hours as needed for nausea or vomiting.  30 tablet  1  .  ranitidine (ZANTAC) 300 MG tablet Take 300 mg by mouth at bedtime.      . sucralfate (CARAFATE) 1 GM/10ML suspension Take 10 mLs (1 g total) by mouth 4 (four) times daily -  with meals and at bedtime.  420 mL  0  . traMADol (ULTRAM) 50 MG tablet Take 25 mg by mouth every 6 (six) hours as needed for moderate pain.       No current facility-administered medications for this visit.    SURGICAL HISTORY:  Past Surgical History  Procedure Laterality Date  . Thyroidectomy  1973  . Dilation and curettage of uterus    . Carpal tunnel release Right 1988  . Rotator cuff repair  2006    ? side  . H/o met test w/pft  04/02/2012    low risk; peak VO2 77% predicted  . Cardiac catheterization  07/08/2008     normal coronaries  . Transthoracic echocardiogram  09/25/2012    EF 55-60%; mild LVH & mild concentric hypertrophy; mild AV regurg; RV systolic pressure increase consistent with mild pulm HTN  . Video bronchoscopy Bilateral 09/25/2013    Procedure: VIDEO BRONCHOSCOPY WITHOUT FLUORO;  Surgeon: Tanda Rockers, MD;  Location: [facility];  Service: Cardiopulmonary;  Laterality: Bilateral;  . Joint replacement  2003    thumb rt  . Knee arthroscopy  12    rt meniscus  . Video bronchoscopy with endobronchial ultrasound N/A 10/30/2013    Procedure: VIDEO BRONCHOSCOPY WITH ENDOBRONCHIAL ULTRASOUND;  Surgeon: Melrose Nakayama, MD;  Location: [facility];  Service: Thoracic;  Laterality: N/A;  . Mediastinoscopy N/A 10/30/2013    Procedure: MEDIASTINOSCOPY;  Surgeon: Melrose Nakayama, MD;  Location: [facility];  Service: Thoracic;  Laterality: N/A;  . Eye surgery Bilateral   . Video assisted thoracoscopy (vats)/ lobectomy Right 11/13/2013    Procedure: VIDEO ASSISTED THORACOSCOPY (VATS) with mediastinal  biopsies;  Surgeon: Melrose Nakayama, MD;  Location: [facility];  Service: Thoracic;  Laterality: Right;  RIGHT VATS,mediastinal biopsies    REVIEW OF SYSTEMS:  Constitutional: positive for fatigue Eyes: negative Ears, nose, mouth, throat, and face: negative Respiratory: positive for cough and dyspnea on exertion Cardiovascular: negative Gastrointestinal: negative Genitourinary:negative Integument/breast: negative Hematologic/lymphatic: negative Musculoskeletal:negative Neurological: negative Behavioral/Psych: negative Endocrine: negative Allergic/Immunologic: negative   PHYSICAL EXAMINATION: General appearance: alert, cooperative, fatigued and no distress Head: Normocephalic, without obvious abnormality, atraumatic Neck: no adenopathy, no JVD, supple, symmetrical, trachea midline and thyroid not enlarged, symmetric, no tenderness/mass/nodules Lymph nodes: Cervical, supraclavicular, and axillary  nodes normal. Resp: clear to auscultation bilaterally Back: symmetric, no curvature. ROM normal. No CVA tenderness. Cardio: regular rate and rhythm, S1, S2 normal, no murmur, click, rub or gallop GI: soft, non-tender; bowel sounds normal; no masses,  no organomegaly Extremities: extremities normal, atraumatic, no cyanosis or edema Neurologic: Alert and oriented X 3, normal strength and tone. Normal symmetric reflexes. Normal coordination and gait  ECOG PERFORMANCE STATUS: 1 - Symptomatic but completely ambulatory  Blood pressure 119/58, pulse 79, temperature 99.1 F (37.3 C), temperature source Oral, resp. rate 18, height 5' (1.524 m), weight 162 lb 3.2 oz (73.573 kg).  LABORATORY DATA: Lab Results  Component Value Date   WBC 10.1 12/08/2013   HGB 10.3* 12/08/2013   HCT 31.3* 12/08/2013   MCV 89.4 12/08/2013   PLT 452* 12/08/2013      Chemistry      Component Value Date/Time   NA 136 12/01/2013 0905   NA 137 11/15/2013 0510   K 4.6 12/01/2013  0905   K 4.0 11/15/2013 0510   CL 100 11/15/2013 0510   CO2 27 12/01/2013 0905   CO2 26 11/15/2013 0510   BUN 21.0 12/01/2013 0905   BUN 12 11/15/2013 0510   CREATININE 0.9 12/01/2013 0905   CREATININE 0.80 11/15/2013 0510      Component Value Date/Time   CALCIUM 10.0 12/01/2013 0905   CALCIUM 8.9 11/15/2013 0510   ALKPHOS 137 12/01/2013 0905   ALKPHOS 86 11/15/2013 0510   AST 12 12/01/2013 0905   AST 11 11/15/2013 0510   ALT 10 12/01/2013 0905   ALT 9 11/15/2013 0510   BILITOT 0.31 12/01/2013 0905   BILITOT 0.4 11/15/2013 0510       RADIOGRAPHIC STUDIES: Dg Chest 2 View  12/02/2013   CLINICAL DATA:  Follow-up VATS right lung  EXAM: CHEST  2 VIEW  COMPARISON:  Prior radiograph from 11/15/2013  FINDINGS: The cardiac and mediastinal silhouettes are stable in size and contour, and remain within normal limits. Previously seen right IJ central venous catheter has been removed.  The lungs are normally inflated. There has been interval improvement in the  aeration of the right lung base as compared to prior. Small amount of fluid and/or atelectasis persists along the right minor fissure. No pneumothorax. The left lung is clear. Pulmonary vascularity is normal.  No acute osseous abnormality identified.  IMPRESSION: Interval improvement in aeration of the right lung base. No new acute cardiopulmonary abnormality identified.   Electronically Signed   By: Jeannine Boga M.D.   On: 12/02/2013 12:26   Dg Chest 2 View  11/15/2013   CLINICAL DATA:  Atelectasis  EXAM: CHEST  2 VIEW  COMPARISON:  11/14/1948  FINDINGS: Cardiac shadow is within normal limits. A right central venous line is again seen and stable. Right lower lobe infiltrate is again identified but mildly improved when compare with the prior study. Some fluid is noted within the minor fissure.  IMPRESSION: Improved aeration in the right base   Electronically Signed   By: Inez Catalina M.D.   On: 11/15/2013 07:37   Dg Chest 2 View  11/11/2013   CLINICAL DATA Squamous carcinoma of lung  EXAM CHEST  2 VIEW  COMPARISON CXR 10/30/2013, chest CT 09/19/2013  FINDINGS Right middle lobe volume loss unchanged from the CT. This could be due to endobronchial obstruction from a mass.  Calcified granuloma left lung base unchanged  Negative for heart failure or effusion.  Heart size is normal.  IMPRESSION Right middle lobe volume loss unchanged. Endobronchial obstructing lesion not excluded.  SIGNATURE  Electronically Signed   By: Franchot Gallo M.D.   On: 11/11/2013 16:15   Dg Chest Port 1 View  11/14/2013   CLINICAL DATA:  Right chest tube removed.  EXAM: PORTABLE CHEST - 1 VIEW  COMPARISON:  DG CHEST 1V PORT dated 11/14/2013; CT CHEST W/O CM dated 09/19/2013; DG CHEST 2 VIEW dated 09/19/2013  FINDINGS: The right chest tube has been removed. Question a tiny right apical pneumothorax. There is no evidence for a large pneumothorax. Stable density in the right mid lung. Again noted is volume loss at the right lung  base. Central venous line in the lower SVC. Stable linear density in the left mid lung is consistent with scarring or atelectasis.  IMPRESSION: Removal of right chest tube and cannot exclude a tiny right apical pneumothorax. No evidence for a large pneumothorax.  Stable densities or volume loss in the right middle lobe region.   Electronically  Signed   By: Markus Daft M.D.   On: 11/14/2013 10:58   Dg Chest Port 1 View  11/14/2013   CLINICAL DATA:  Right chest tube, followup portable exam 0618 hr compared to 11/13/2013  EXAM: PORTABLE CHEST - 1 VIEW  COMPARISON:  None.  FINDINGS: Right thoracostomy tube stable.  Right jugular central venous catheter tip projecting over SVC near cavoatrial junction.  Enlargement of cardiac silhouette.  Mediastinal contours and pulmonary vascularity normal.  Bibasilar atelectasis greater on right.  No definite infiltrate, pleural effusion or pneumothorax.  IMPRESSION: Enlargement of cardiac silhouette.  Bibasilar atelectasis greater on right.  Little interval change.   Electronically Signed   By: Lavonia Dana M.D.   On: 11/14/2013 08:15   Dg Chest Portable 1 View  11/13/2013   CLINICAL DATA:  Postop resection of right middle lobe squamous cell carcinoma  EXAM: PORTABLE CHEST - 1 VIEW  COMPARISON:  Chest x-ray of 11/11/2013  FINDINGS: There is opacity at the right lung base postoperatively due to postop change and atelectasis. A right chest tube is present and no right pneumothorax is seen. Aeration of the left lung has diminished with areas of linear atelectasis. A right IJ stent venous line tip overlies the lower SVC. Heart size is normal on this poor inspiration film.  IMPRESSION: Postop opacity at the right lung base after resection of right middle lobe squamous cell carcinoma. Right chest tube is present with no pneumothorax.   Electronically Signed   By: Ivar Drape M.D.   On: 11/13/2013 15:23    ASSESSMENT AND PLAN: This is a very pleasant 73 years old white female with  unresectable stage IIIa non-small cell lung cancer currently undergoing concurrent chemoradiation with weekly carboplatin and paclitaxel is status post 1 cycle. She tolerated the first cycle of her treatment fairly well except for fatigue, chest congestion and dry cough. Her cough Iis most likely secondary to her disease in addition to radiation effect. I recommended for the patient to take Claritin 10 mg by mouth daily as needed for chest congestion and cough. I also gave her the option of treatment with Medrol Dosepak but she would like to hold on this for now. I will see her back for followup visit in 2 weeks for reevaluation and management any adverse effect of her treatment. She was advised to call immediately if she has any concerning symptoms in the interval. The patient voices understanding of current disease status and treatment options and is in agreement with the current care plan.  All questions were answered. The patient knows to call the clinic with any problems, questions or concerns. We can certainly see the patient much sooner if necessary.  Disclaimer: This note was dictated with voice recognition software. Similar sounding words can inadvertently be transcribed and may not be corrected upon review.

## 2013-12-08 NOTE — Patient Instructions (Signed)
[facility] Discharge Instructions for Patients Receiving Chemotherapy  Today you received the following chemotherapy agents: Taxol, Carboplatin   To help prevent nausea and vomiting after your treatment, we encourage you to take your nausea medication as prescribed.    If you develop nausea and vomiting that is not controlled by your nausea medication, call the clinic.   BELOW ARE SYMPTOMS THAT SHOULD BE REPORTED IMMEDIATELY:  *FEVER GREATER THAN 100.5 F  *CHILLS WITH OR WITHOUT FEVER  NAUSEA AND VOMITING THAT IS NOT CONTROLLED WITH YOUR NAUSEA MEDICATION  *UNUSUAL SHORTNESS OF BREATH  *UNUSUAL BRUISING OR BLEEDING  TENDERNESS IN MOUTH AND THROAT WITH OR WITHOUT PRESENCE OF ULCERS  *URINARY PROBLEMS  *BOWEL PROBLEMS  UNUSUAL RASH Items with * indicate a potential emergency and should be followed up as soon as possible.  Feel free to call the clinic you have any questions or concerns. The clinic phone number is [phone].

## 2013-12-09 ENCOUNTER — Telehealth: Payer: Self-pay | Admitting: *Deleted

## 2013-12-09 ENCOUNTER — Ambulatory Visit
Admission: RE | Admit: 2013-12-09 | Discharge: 2013-12-09 | Disposition: A | Discharge status: Home / Self Care | Payer: Medicare Other | Source: Ambulatory Visit | Attending: Radiation Oncology | Admitting: Radiation Oncology

## 2013-12-09 VITALS — BP 104/65 | HR 98 | Temp 98.0°F | Ht 60.0 in | Wt 165.0 lb

## 2013-12-09 DIAGNOSIS — C342 Malignant neoplasm of middle lobe, bronchus or lung: Secondary | ICD-10-CM

## 2013-12-09 NOTE — Telephone Encounter (Signed)
New pt consultation from [facility] in [place], [place] dated 11/21/13 given to Dr Vista Mink to review.  SLJ

## 2013-12-09 NOTE — Progress Notes (Signed)
  Radiation Oncology         [phone] ________________________________  Name: Kimberly [name] MRN: [id number]  Date: 12/09/2013  DOB: 11/06/1940  Weekly Radiation Therapy Management  Current Dose: 12.6 Gy     Planned Dose:  50.4 Gy  Narrative . . . . . . . . The patient presents for routine under treatment assessment.                                   The patient is without complaint.  She has noticed her cough to be more productive of mucus. She denies any blood or green sputum. She was able to sleep through the night without coughing last night which is a first for her.                                 Set-up films were reviewed.                                 The chart was checked. Physical Findings. . .  height is 5' (1.524 m) and weight is 165 lb (74.844 kg). Her temperature is 98 F (36.7 C). Her blood pressure is 104/65 and her pulse is 98. Her oxygen saturation is 97%. . Weight essentially stable.  No significant changes. No wheezing. Impression . . . . . . . The patient is tolerating radiation. Plan . . . . . . . . . . . . Continue treatment as planned.  ________________________________   Blair Promise, PhD, MD

## 2013-12-09 NOTE — Progress Notes (Signed)
Kimberly [name] has had 7 fractions to her right chest.  She denies pain and sore throat.  She does have a frequent, productive cough that she says is getting better.  She reports that last night was the first night she slept through the night.  She is taking tussionex at night.  She had chemotherapy Monday.  She has nausea in the mornings.  She reports that everything tastes metallic. She is taking compazine.  She reports fatigue.  She reports the skin on her right chest is tender. The skin is intact.  She is using radiaplex gel.

## 2013-12-10 ENCOUNTER — Telehealth: Payer: Self-pay | Admitting: Internal Medicine

## 2013-12-10 ENCOUNTER — Ambulatory Visit
Admission: RE | Admit: 2013-12-10 | Discharge: 2013-12-10 | Disposition: A | Discharge status: Home / Self Care | Payer: Medicare Other | Source: Ambulatory Visit | Attending: Radiation Oncology | Admitting: Radiation Oncology

## 2013-12-10 NOTE — Telephone Encounter (Signed)
s.w. pt and advised on April appt....pt will pick up new sched tomorrow

## 2013-12-11 ENCOUNTER — Ambulatory Visit
Admission: RE | Admit: 2013-12-11 | Discharge: 2013-12-11 | Disposition: A | Discharge status: Home / Self Care | Payer: Medicare Other | Source: Ambulatory Visit | Attending: Radiation Oncology | Admitting: Radiation Oncology

## 2013-12-12 ENCOUNTER — Ambulatory Visit: Payer: Medicare Other

## 2013-12-12 LAB — AFB CULTURE WITH SMEAR (NOT AT ARMC)
ACID FAST SMEAR: NONE SEEN
Acid Fast Smear: NONE SEEN

## 2013-12-15 ENCOUNTER — Ambulatory Visit
Admission: RE | Admit: 2013-12-15 | Discharge: 2013-12-15 | Disposition: A | Discharge status: Home / Self Care | Payer: Medicare Other | Source: Ambulatory Visit | Attending: Radiation Oncology | Admitting: Radiation Oncology

## 2013-12-15 ENCOUNTER — Other Ambulatory Visit (HOSPITAL_BASED_OUTPATIENT_CLINIC_OR_DEPARTMENT_OTHER): Payer: Medicare Other

## 2013-12-15 ENCOUNTER — Ambulatory Visit (HOSPITAL_BASED_OUTPATIENT_CLINIC_OR_DEPARTMENT_OTHER): Payer: Medicare Other

## 2013-12-15 VITALS — BP 149/60 | HR 98 | Temp 97.9°F | Resp 19

## 2013-12-15 DIAGNOSIS — Z5111 Encounter for antineoplastic chemotherapy: Secondary | ICD-10-CM

## 2013-12-15 DIAGNOSIS — C3491 Malignant neoplasm of unspecified part of right bronchus or lung: Secondary | ICD-10-CM

## 2013-12-15 DIAGNOSIS — C342 Malignant neoplasm of middle lobe, bronchus or lung: Secondary | ICD-10-CM

## 2013-12-15 LAB — CBC WITH DIFFERENTIAL/PLATELET
BASO%: 0.7 % (ref 0.0–2.0)
BASOS ABS: 0.1 10*3/uL (ref 0.0–0.1)
EOS ABS: 0.2 10*3/uL (ref 0.0–0.5)
EOS%: 3.1 % (ref 0.0–7.0)
HEMATOCRIT: 30.8 % — AB (ref 34.8–46.6)
HEMOGLOBIN: 10.2 g/dL — AB (ref 11.6–15.9)
LYMPH#: 0.6 10*3/uL — AB (ref 0.9–3.3)
LYMPH%: 7.8 % — AB (ref 14.0–49.7)
MCH: 29.7 pg (ref 25.1–34.0)
MCHC: 33.1 g/dL (ref 31.5–36.0)
MCV: 89.5 fL (ref 79.5–101.0)
MONO#: 0.4 10*3/uL (ref 0.1–0.9)
MONO%: 5.4 % (ref 0.0–14.0)
NEUT%: 83 % — AB (ref 38.4–76.8)
NEUTROS ABS: 5.9 10*3/uL (ref 1.5–6.5)
PLATELETS: 336 10*3/uL (ref 145–400)
RBC: 3.44 10*6/uL — ABNORMAL LOW (ref 3.70–5.45)
RDW: 12.6 % (ref 11.2–14.5)
WBC: 7.1 10*3/uL (ref 3.9–10.3)

## 2013-12-15 LAB — COMPREHENSIVE METABOLIC PANEL (CC13)
ALT: 11 U/L (ref 0–55)
ANION GAP: 10 meq/L (ref 3–11)
AST: 13 U/L (ref 5–34)
Albumin: 3.2 g/dL — ABNORMAL LOW (ref 3.5–5.0)
Alkaline Phosphatase: 100 U/L (ref 40–150)
BILIRUBIN TOTAL: 0.43 mg/dL (ref 0.20–1.20)
BUN: 19.5 mg/dL (ref 7.0–26.0)
CALCIUM: 9.7 mg/dL (ref 8.4–10.4)
CO2: 25 mEq/L (ref 22–29)
CREATININE: 0.9 mg/dL (ref 0.6–1.1)
Chloride: 101 mEq/L (ref 98–109)
GLUCOSE: 149 mg/dL — AB (ref 70–140)
Potassium: 3.7 mEq/L (ref 3.5–5.1)
Sodium: 137 mEq/L (ref 136–145)
Total Protein: 6.6 g/dL (ref 6.4–8.3)

## 2013-12-15 MED ORDER — ONDANSETRON 16 MG/50ML IVPB (CHCC)
INTRAVENOUS | Status: AC
  Filled 2013-12-15: qty 16

## 2013-12-15 MED ORDER — DEXAMETHASONE SODIUM PHOSPHATE 20 MG/5ML IJ SOLN
INTRAMUSCULAR | Status: AC
  Filled 2013-12-15: qty 5

## 2013-12-15 MED ORDER — FAMOTIDINE IN NACL 20-0.9 MG/50ML-% IV SOLN
20.0000 mg | Freq: Once | INTRAVENOUS | Status: AC
  Administered 2013-12-15: 20 mg via INTRAVENOUS

## 2013-12-15 MED ORDER — FAMOTIDINE IN NACL 20-0.9 MG/50ML-% IV SOLN
INTRAVENOUS | Status: AC
  Filled 2013-12-15: qty 50

## 2013-12-15 MED ORDER — DIPHENHYDRAMINE HCL 50 MG/ML IJ SOLN
INTRAMUSCULAR | Status: AC
  Filled 2013-12-15: qty 1

## 2013-12-15 MED ORDER — ONDANSETRON 16 MG/50ML IVPB (CHCC)
16.0000 mg | Freq: Once | INTRAVENOUS | Status: AC
  Administered 2013-12-15: 16 mg via INTRAVENOUS

## 2013-12-15 MED ORDER — DEXAMETHASONE SODIUM PHOSPHATE 20 MG/5ML IJ SOLN
20.0000 mg | Freq: Once | INTRAMUSCULAR | Status: AC
  Administered 2013-12-15: 20 mg via INTRAVENOUS

## 2013-12-15 MED ORDER — SODIUM CHLORIDE 0.9 % IV SOLN
45.0000 mg/m2 | Freq: Once | INTRAVENOUS | Status: AC
  Administered 2013-12-15: 78 mg via INTRAVENOUS
  Filled 2013-12-15: qty 13

## 2013-12-15 MED ORDER — CARBOPLATIN CHEMO INJECTION 450 MG/45ML
172.2000 mg | Freq: Once | INTRAVENOUS | Status: AC
  Administered 2013-12-15: 170 mg via INTRAVENOUS
  Filled 2013-12-15: qty 17

## 2013-12-15 MED ORDER — SODIUM CHLORIDE 0.9 % IV SOLN
Freq: Once | INTRAVENOUS | Status: AC
  Administered 2013-12-15: 09:00:00 via INTRAVENOUS

## 2013-12-15 MED ORDER — DIPHENHYDRAMINE HCL 50 MG/ML IJ SOLN
50.0000 mg | Freq: Once | INTRAMUSCULAR | Status: AC
  Administered 2013-12-15: 50 mg via INTRAVENOUS

## 2013-12-15 NOTE — Progress Notes (Signed)
Labs from 12/15/13 reviewed, within treatment parameters. Patient has no questions or concerns at this time. Treatment completed without complications.

## 2013-12-15 NOTE — Patient Instructions (Signed)
[facility] Discharge Instructions for Patients Receiving Chemotherapy  Today you received the following chemotherapy agents: Taxol, Carboplatin  To help prevent nausea and vomiting after your treatment, we encourage you to take your nausea medication: Compazine 10 mg every 6 hrs as needed.    If you develop nausea and vomiting that is not controlled by your nausea medication, call the clinic.   BELOW ARE SYMPTOMS THAT SHOULD BE REPORTED IMMEDIATELY:  *FEVER GREATER THAN 100.5 F  *CHILLS WITH OR WITHOUT FEVER  NAUSEA AND VOMITING THAT IS NOT CONTROLLED WITH YOUR NAUSEA MEDICATION  *UNUSUAL SHORTNESS OF BREATH  *UNUSUAL BRUISING OR BLEEDING  TENDERNESS IN MOUTH AND THROAT WITH OR WITHOUT PRESENCE OF ULCERS  *URINARY PROBLEMS  *BOWEL PROBLEMS  UNUSUAL RASH Items with * indicate a potential emergency and should be followed up as soon as possible.  Feel free to call the clinic you have any questions or concerns. The clinic phone number is [phone].

## 2013-12-16 ENCOUNTER — Ambulatory Visit
Admission: RE | Admit: 2013-12-16 | Discharge: 2013-12-16 | Disposition: A | Discharge status: Home / Self Care | Payer: Medicare Other | Source: Ambulatory Visit | Attending: Radiation Oncology | Admitting: Radiation Oncology

## 2013-12-16 VITALS — BP 139/61 | HR 79 | Temp 97.7°F | Ht 60.0 in | Wt 164.4 lb

## 2013-12-16 DIAGNOSIS — C342 Malignant neoplasm of middle lobe, bronchus or lung: Secondary | ICD-10-CM

## 2013-12-16 NOTE — Progress Notes (Signed)
[facility]     Kimberly [name], M.D. [place], [place] [address]               Kimberly [name], M.D., Ph.D. Phone: [phone]      Kimberly [name] A. Kimberly [name], M.D. Fax: [phone]      Kimberly [name], M.D., Ph.D.         Kimberly [name], M.D.         Kimberly [name], M.D Weekly Treatment Management Note  Name: Kimberly [name]     MRN: [id number]        CSN: [id number] Date: 12/16/2013      DOB: 1940/11/30  CC: Kimberly Pel, MD         [facility]    Status: Outpatient  Diagnosis: The encounter diagnosis was Lung cancer, middle lobe.  Current Dose: 19.8 Gy  Current Fraction: 11  Planned Dose: 50.4 Gy  Narrative: Kimberly [name] was seen today for weekly treatment management. The chart was checked and CBCT  were reviewed. She did have some problems with constipation related to her chemotherapy last week. She be proactive this week concerning this issue. She is receiving weekly chemotherapy during her radiation treatment. She denies any pain with swallowing but does feel some slight swelling in her esophageal area. She is able to take in solid foods without difficulty at this time. She does report a productive cough on occasion. She denies any chills or fever.  Milk-related compounds Current Outpatient Prescriptions  Medication Sig Dispense Refill  . atorvastatin (LIPITOR) 20 MG tablet Take 20 mg by mouth daily.      . Bisacodyl (DULCOLAX PO) Take 2 tablets by mouth once.      . chlorpheniramine-HYDROcodone (TUSSIONEX) 10-8 MG/5ML LQCR Take 5 mLs by mouth at bedtime as needed for cough.  115 mL  0  . EPINEPHrine (EPIPEN IJ) Inject 1 each as directed once as needed (for reaction to milk products).      . hyaluronate sodium (RADIAPLEXRX) GEL Apply 1 application topically 2 (two) times daily.      [name] levothyroxine (SYNTHROID, LEVOTHROID) 125 MCG tablet Take 125 mcg by mouth daily before breakfast.      . montelukast (SINGULAIR) 10  MG tablet Take 10 mg by mouth daily.      . pantoprazole (PROTONIX) 40 MG tablet Take 40 mg by mouth 2 (two) times daily.       . prochlorperazine (COMPAZINE) 10 MG tablet Take 1 tablet (10 mg total) by mouth every 6 (six) hours as needed for nausea or vomiting.  30 tablet  1  . ranitidine (ZANTAC) 300 MG tablet Take 300 mg by mouth at bedtime.      . sucralfate (CARAFATE) 1 GM/10ML suspension Take 10 mLs (1 g total) by mouth 4 (four) times daily -  with meals and at bedtime.  420 mL  0  . amLODipine (NORVASC) 10 MG tablet Take 5 mg by mouth daily.       . irbesartan-hydrochlorothiazide (AVALIDE) 300-12.5 MG per tablet Take 1 tablet by mouth daily.      . metoprolol succinate (TOPROL-XL) 25 MG 24 hr tablet Take 12.5 mg by mouth daily.      . traMADol (ULTRAM) 50 MG tablet Take 25 mg by mouth every 6 (six) hours as needed for moderate pain.       No current facility-administered medications for this encounter.   Labs:  Lab Results  Component Value Date   WBC 7.1 12/15/2013   HGB 10.2* 12/15/2013   HCT 30.8* 12/15/2013   MCV 89.5 12/15/2013   PLT 336 12/15/2013   Lab Results  Component Value Date   CREATININE 0.9 12/15/2013   BUN 19.5 12/15/2013   NA 137 12/15/2013   K 3.7 12/15/2013   CL 100 11/15/2013   CO2 25 12/15/2013   Lab Results  Component Value Date   ALT 11 12/15/2013   AST 13 12/15/2013   BILITOT 0.43 12/15/2013    Physical Examination:  Filed Vitals:   12/16/13 1050  BP: 139/61  Pulse: 79  Temp: 97.7 F (36.5 C)    Wt Readings from Last 3 Encounters:  12/16/13 164 lb 6.4 oz (74.571 kg)  12/09/13 165 lb (74.844 kg)  12/08/13 162 lb 3.2 oz (73.573 kg)     Lungs - Normal respiratory effort, chest expands symmetrically. Lungs are clear to auscultation, no crackles or wheezes.  Heart has regular rhythm and rate  Abdomen is soft and non tender with normal bowel sounds  Assessment:  Patient tolerating treatments well except for issues as above  Plan: Continue  treatment per original radiation prescription

## 2013-12-16 NOTE — Progress Notes (Signed)
Kimberly [name] has had 11 fractions to her right chest.  She denies pain.  She reports that her throat feels "constricted."  She is taking carafate.  She reports a frequent cough.  She reports that she can control it at night with the Tussionex and is able to sleep.  She reports the cough is productive and is "medium/heavy."  She denies shortness of breath.  She had chemotherapy yesterday.  She reports her appetite is poor and has a metalic taste in her mouth.  She said she had trouble with low blood pressure earlier in the week.  She has been taken off norvasc and has stopped taking her other bp meds herself.  BP today was 139/61.  She reports fatigue.  The skin on her right chest/side is pink.  She is using radiaplex gel.

## 2013-12-17 ENCOUNTER — Ambulatory Visit
Admission: RE | Admit: 2013-12-17 | Discharge: 2013-12-17 | Disposition: A | Discharge status: Home / Self Care | Payer: Medicare Other | Source: Ambulatory Visit | Attending: Radiation Oncology | Admitting: Radiation Oncology

## 2013-12-18 ENCOUNTER — Ambulatory Visit
Admission: RE | Admit: 2013-12-18 | Discharge: 2013-12-18 | Disposition: A | Discharge status: Home / Self Care | Payer: Medicare Other | Source: Ambulatory Visit | Attending: Radiation Oncology | Admitting: Radiation Oncology

## 2013-12-19 ENCOUNTER — Ambulatory Visit
Admission: RE | Admit: 2013-12-19 | Discharge: 2013-12-19 | Disposition: A | Discharge status: Home / Self Care | Payer: Medicare Other | Source: Ambulatory Visit | Attending: Radiation Oncology | Admitting: Radiation Oncology

## 2013-12-19 ENCOUNTER — Telehealth: Payer: Self-pay | Admitting: *Deleted

## 2013-12-19 ENCOUNTER — Telehealth: Payer: Self-pay | Admitting: Internal Medicine

## 2013-12-19 ENCOUNTER — Encounter: Payer: Self-pay | Admitting: Physician Assistant

## 2013-12-19 ENCOUNTER — Ambulatory Visit (HOSPITAL_BASED_OUTPATIENT_CLINIC_OR_DEPARTMENT_OTHER): Payer: Medicare Other | Admitting: Physician Assistant

## 2013-12-19 ENCOUNTER — Other Ambulatory Visit (HOSPITAL_BASED_OUTPATIENT_CLINIC_OR_DEPARTMENT_OTHER): Payer: Medicare Other

## 2013-12-19 VITALS — BP 141/58 | HR 102 | Temp 97.9°F | Resp 19 | Ht 60.0 in | Wt 163.7 lb

## 2013-12-19 DIAGNOSIS — C3491 Malignant neoplasm of unspecified part of right bronchus or lung: Secondary | ICD-10-CM

## 2013-12-19 DIAGNOSIS — C342 Malignant neoplasm of middle lobe, bronchus or lung: Secondary | ICD-10-CM

## 2013-12-19 LAB — CBC WITH DIFFERENTIAL/PLATELET
BASO%: 0.6 % (ref 0.0–2.0)
Basophils Absolute: 0 10*3/uL (ref 0.0–0.1)
EOS%: 2 % (ref 0.0–7.0)
Eosinophils Absolute: 0.1 10*3/uL (ref 0.0–0.5)
HCT: 30.8 % — ABNORMAL LOW (ref 34.8–46.6)
HGB: 10.4 g/dL — ABNORMAL LOW (ref 11.6–15.9)
LYMPH#: 0.6 10*3/uL — AB (ref 0.9–3.3)
LYMPH%: 10 % — AB (ref 14.0–49.7)
MCH: 30.4 pg (ref 25.1–34.0)
MCHC: 33.6 g/dL (ref 31.5–36.0)
MCV: 90.5 fL (ref 79.5–101.0)
MONO#: 0.3 10*3/uL (ref 0.1–0.9)
MONO%: 4.8 % (ref 0.0–14.0)
NEUT#: 4.6 10*3/uL (ref 1.5–6.5)
NEUT%: 82.6 % — ABNORMAL HIGH (ref 38.4–76.8)
Platelets: 366 10*3/uL (ref 145–400)
RBC: 3.41 10*6/uL — ABNORMAL LOW (ref 3.70–5.45)
RDW: 12.9 % (ref 11.2–14.5)
WBC: 5.6 10*3/uL (ref 3.9–10.3)

## 2013-12-19 LAB — COMPREHENSIVE METABOLIC PANEL (CC13)
ALK PHOS: 86 U/L (ref 40–150)
ALT: 14 U/L (ref 0–55)
AST: 15 U/L (ref 5–34)
Albumin: 3.1 g/dL — ABNORMAL LOW (ref 3.5–5.0)
Anion Gap: 10 mEq/L (ref 3–11)
BILIRUBIN TOTAL: 0.51 mg/dL (ref 0.20–1.20)
BUN: 14.3 mg/dL (ref 7.0–26.0)
CO2: 28 mEq/L (ref 22–29)
Calcium: 9.6 mg/dL (ref 8.4–10.4)
Chloride: 103 mEq/L (ref 98–109)
Creatinine: 0.8 mg/dL (ref 0.6–1.1)
Glucose: 115 mg/dl (ref 70–140)
Potassium: 3.7 mEq/L (ref 3.5–5.1)
SODIUM: 141 meq/L (ref 136–145)
Total Protein: 6.4 g/dL (ref 6.4–8.3)

## 2013-12-19 NOTE — Progress Notes (Addendum)
[place] [phone]   [phone]  SHARED VISIT PROGRESS NOTE  [name], MD [address] [place] [address]  DIAGNOSIS: Unresectable, likely a stage IIIA (T2a, N2, M0) non-small cell lung cancer, squamous cell carcinoma diagnosed in February 2015.  PRIOR THERAPY: On 11/13/2013 the patient underwent exploratory right VATS with mediastinal biopsy under the care of Dr. Roxan Hockey.  CURRENT THERAPY: Concurrent chemoradiation with weekly carboplatin for AUC of 2 and paclitaxel 45 mg/M2, status post 3 cycles. First cycle was given on 12/01/2013.  INTERVAL HISTORY: Kimberly [name] 73 y.o. female returns to the clinic today for a symptom management visit prior to cycle #4. The patient is tolerating her course of concurrent chemoradiation relatively well with the exception of some fatigue. She also reports that she started to have some difficulty with swallowing. Radiation oncology is aware of her symptomatology. She's had some constipation mild malaise and achiness voiced no other specific complaints. She denied having any significant nausea or vomiting, no fever or chills. She denied having any chest pain but continues to have shortness of breath with exertion with no hemoptysis.   MEDICAL HISTORY: Past Medical History  Diagnosis Date  . Hypertension   . Thyroid disease   . GERD (gastroesophageal reflux disease)   . Cancer   . Dyslipidemia   . Aortic insufficiency   . Mitral insufficiency   . History of nuclear stress test 02/26/2006    exercise myoview; normal pattern of perfusion; low risk scan   . PONV (postoperative nausea and vomiting)   . Heart murmur   . Hypothyroidism   . Cough     dry, endobronchial mass  . Pneumonia     pus bronchitis  . Arthritis   . Syncope     "states she has passed out a few times. dr is trying to find cause.last time when geeting ready to go home after video bronch/bx  .  Shortness of breath     with exertion  . Anxiety     due to surgery   . Bronchitis     ALLERGIES:  is allergic to milk-related compounds.  MEDICATIONS:  Current Outpatient Prescriptions  Medication Sig Dispense Refill  . atorvastatin (LIPITOR) 20 MG tablet Take 20 mg by mouth daily.      . Bisacodyl (DULCOLAX PO) Take 2 tablets by mouth once.      . chlorpheniramine-HYDROcodone (TUSSIONEX) 10-8 MG/5ML LQCR Take 5 mLs by mouth at bedtime as needed for cough.  115 mL  0  . EPINEPHrine (EPIPEN IJ) Inject 1 each as directed once as needed (for reaction to milk products).      . hyaluronate sodium (RADIAPLEXRX) GEL Apply 1 application topically 2 (two) times daily.      . irbesartan-hydrochlorothiazide (AVALIDE) 300-12.5 MG per tablet Take 1 tablet by mouth daily.      [name] levothyroxine (SYNTHROID, LEVOTHROID) 125 MCG tablet Take 125 mcg by mouth daily before breakfast.      . metoprolol succinate (TOPROL-XL) 25 MG 24 hr tablet Take 12.5 mg by mouth daily.      . montelukast (SINGULAIR) 10 MG tablet Take 10 mg by mouth daily.      . pantoprazole (PROTONIX) 40 MG tablet Take 40 mg by mouth 2 (two) times daily.       . prochlorperazine (COMPAZINE) 10 MG tablet Take 1 tablet (10 mg total) by mouth every 6 (six) hours as needed for nausea or vomiting.  30 tablet  1  . ranitidine (ZANTAC) 300 MG tablet Take 300 mg by mouth at bedtime.      . sucralfate (CARAFATE) 1 GM/10ML suspension Take 10 mLs (1 g total) by mouth 4 (four) times daily -  with meals and at bedtime.  420 mL  0  . traMADol (ULTRAM) 50 MG tablet Take 25 mg by mouth every 6 (six) hours as needed for moderate pain.       No current facility-administered medications for this visit.    SURGICAL HISTORY:  Past Surgical History  Procedure Laterality Date  . Thyroidectomy  1973  . Dilation and curettage of uterus    . Carpal tunnel release Right 1988  . Rotator cuff repair  2006    ? side  . H/o met test w/pft  04/02/2012    low  risk; peak VO2 77% predicted  . Cardiac catheterization  07/08/2008    normal coronaries  . Transthoracic echocardiogram  09/25/2012    EF 55-60%; mild LVH & mild concentric hypertrophy; mild AV regurg; RV systolic pressure increase consistent with mild pulm HTN  . Video bronchoscopy Bilateral 09/25/2013    Procedure: VIDEO BRONCHOSCOPY WITHOUT FLUORO;  Surgeon: Tanda Rockers, MD;  Location: [facility];  Service: Cardiopulmonary;  Laterality: Bilateral;  . Joint replacement  2003    thumb rt  . Knee arthroscopy  12    rt meniscus  . Video bronchoscopy with endobronchial ultrasound N/A 10/30/2013    Procedure: VIDEO BRONCHOSCOPY WITH ENDOBRONCHIAL ULTRASOUND;  Surgeon: Melrose Nakayama, MD;  Location: [facility];  Service: Thoracic;  Laterality: N/A;  . Mediastinoscopy N/A 10/30/2013    Procedure: MEDIASTINOSCOPY;  Surgeon: Melrose Nakayama, MD;  Location: [facility];  Service: Thoracic;  Laterality: N/A;  . Eye surgery Bilateral   . Video assisted thoracoscopy (vats)/ lobectomy Right 11/13/2013    Procedure: VIDEO ASSISTED THORACOSCOPY (VATS) with mediastinal  biopsies;  Surgeon: Melrose Nakayama, MD;  Location: [facility];  Service: Thoracic;  Laterality: Right;  RIGHT VATS,mediastinal biopsies    REVIEW OF SYSTEMS:  Constitutional: positive for fatigue and malaise Eyes: negative Ears, nose, mouth, throat, and face: negative Respiratory: positive for dyspnea on exertion Cardiovascular: negative Gastrointestinal: positive for constipation Genitourinary:negative Integument/breast: negative Hematologic/lymphatic: negative Musculoskeletal:negative Neurological: negative Behavioral/Psych: negative Endocrine: negative Allergic/Immunologic: negative   PHYSICAL EXAMINATION: General appearance: alert, cooperative, fatigued and no distress Head: Normocephalic, without obvious abnormality, atraumatic Neck: no adenopathy, no JVD, supple, symmetrical, trachea midline and thyroid not enlarged,  symmetric, no tenderness/mass/nodules Lymph nodes: Cervical, supraclavicular, and axillary nodes normal. Resp: clear to auscultation bilaterally Back: symmetric, no curvature. ROM normal. No CVA tenderness. Cardio: regular rate and rhythm, S1, S2 normal, no murmur, click, rub or gallop GI: soft, non-tender; bowel sounds normal; no masses,  no organomegaly Extremities: extremities normal, atraumatic, no cyanosis or edema Neurologic: Alert and oriented X 3, normal strength and tone. Normal symmetric reflexes. Normal coordination and gait  ECOG PERFORMANCE STATUS: 1 - Symptomatic but completely ambulatory  Blood pressure 141/58, pulse 102, temperature 97.9 F (36.6 C), temperature source Oral, resp. rate 19, height 5' (1.524 m), weight 163 lb 11.2 oz (74.254 kg).  LABORATORY DATA: Lab Results  Component Value Date   WBC 5.6 12/19/2013   HGB 10.4* 12/19/2013   HCT 30.8* 12/19/2013   MCV 90.5 12/19/2013   PLT 366 12/19/2013      Chemistry      Component Value Date/Time   NA 137 12/15/2013 0829   NA  137 11/15/2013 0510   K 3.7 12/15/2013 0829   K 4.0 11/15/2013 0510   CL 100 11/15/2013 0510   CO2 25 12/15/2013 0829   CO2 26 11/15/2013 0510   BUN 19.5 12/15/2013 0829   BUN 12 11/15/2013 0510   CREATININE 0.9 12/15/2013 0829   CREATININE 0.80 11/15/2013 0510      Component Value Date/Time   CALCIUM 9.7 12/15/2013 0829   CALCIUM 8.9 11/15/2013 0510   ALKPHOS 100 12/15/2013 0829   ALKPHOS 86 11/15/2013 0510   AST 13 12/15/2013 0829   AST 11 11/15/2013 0510   ALT 11 12/15/2013 0829   ALT 9 11/15/2013 0510   BILITOT 0.43 12/15/2013 0829   BILITOT 0.4 11/15/2013 0510       RADIOGRAPHIC STUDIES: Dg Chest 2 View  12/02/2013   CLINICAL DATA:  Follow-up VATS right lung  EXAM: CHEST  2 VIEW  COMPARISON:  Prior radiograph from 11/15/2013  FINDINGS: The cardiac and mediastinal silhouettes are stable in size and contour, and remain within normal limits. Previously seen right IJ central venous catheter has  been removed.  The lungs are normally inflated. There has been interval improvement in the aeration of the right lung base as compared to prior. Small amount of fluid and/or atelectasis persists along the right minor fissure. No pneumothorax. The left lung is clear. Pulmonary vascularity is normal.  No acute osseous abnormality identified.  IMPRESSION: Interval improvement in aeration of the right lung base. No new acute cardiopulmonary abnormality identified.   Electronically Signed   By: Jeannine Boga M.D.   On: 12/02/2013 12:26   Dg Chest 2 View  11/15/2013   CLINICAL DATA:  Atelectasis  EXAM: CHEST  2 VIEW  COMPARISON:  11/14/1948  FINDINGS: Cardiac shadow is within normal limits. A right central venous line is again seen and stable. Right lower lobe infiltrate is again identified but mildly improved when compare with the prior study. Some fluid is noted within the minor fissure.  IMPRESSION: Improved aeration in the right base   Electronically Signed   By: Inez Catalina M.D.   On: 11/15/2013 07:37   Dg Chest 2 View  11/11/2013   CLINICAL DATA Squamous carcinoma of lung  EXAM CHEST  2 VIEW  COMPARISON CXR 10/30/2013, chest CT 09/19/2013  FINDINGS Right middle lobe volume loss unchanged from the CT. This could be due to endobronchial obstruction from a mass.  Calcified granuloma left lung base unchanged  Negative for heart failure or effusion.  Heart size is normal.  IMPRESSION Right middle lobe volume loss unchanged. Endobronchial obstructing lesion not excluded.  SIGNATURE  Electronically Signed   By: Franchot Gallo M.D.   On: 11/11/2013 16:15   Dg Chest Port 1 View  11/14/2013   CLINICAL DATA:  Right chest tube removed.  EXAM: PORTABLE CHEST - 1 VIEW  COMPARISON:  DG CHEST 1V PORT dated 11/14/2013; CT CHEST W/O CM dated 09/19/2013; DG CHEST 2 VIEW dated 09/19/2013  FINDINGS: The right chest tube has been removed. Question a tiny right apical pneumothorax. There is no evidence for a large  pneumothorax. Stable density in the right mid lung. Again noted is volume loss at the right lung base. Central venous line in the lower SVC. Stable linear density in the left mid lung is consistent with scarring or atelectasis.  IMPRESSION: Removal of right chest tube and cannot exclude a tiny right apical pneumothorax. No evidence for a large pneumothorax.  Stable densities or volume loss in  the right middle lobe region.   Electronically Signed   By: Markus Daft M.D.   On: 11/14/2013 10:58   Dg Chest Port 1 View  11/14/2013   CLINICAL DATA:  Right chest tube, followup portable exam 0618 hr compared to 11/13/2013  EXAM: PORTABLE CHEST - 1 VIEW  COMPARISON:  None.  FINDINGS: Right thoracostomy tube stable.  Right jugular central venous catheter tip projecting over SVC near cavoatrial junction.  Enlargement of cardiac silhouette.  Mediastinal contours and pulmonary vascularity normal.  Bibasilar atelectasis greater on right.  No definite infiltrate, pleural effusion or pneumothorax.  IMPRESSION: Enlargement of cardiac silhouette.  Bibasilar atelectasis greater on right.  Little interval change.   Electronically Signed   By: Lavonia Dana M.D.   On: 11/14/2013 08:15   Dg Chest Portable 1 View  11/13/2013   CLINICAL DATA:  Postop resection of right middle lobe squamous cell carcinoma  EXAM: PORTABLE CHEST - 1 VIEW  COMPARISON:  Chest x-ray of 11/11/2013  FINDINGS: There is opacity at the right lung base postoperatively due to postop change and atelectasis. A right chest tube is present and no right pneumothorax is seen. Aeration of the left lung has diminished with areas of linear atelectasis. A right IJ stent venous line tip overlies the lower SVC. Heart size is normal on this poor inspiration film.  IMPRESSION: Postop opacity at the right lung base after resection of right middle lobe squamous cell carcinoma. Right chest tube is present with no pneumothorax.   Electronically Signed   By: Ivar Drape M.D.   On:  11/13/2013 15:23    ASSESSMENT AND PLAN: This is a very pleasant 73 years old white female with unresectable stage IIIa non-small cell lung cancer currently undergoing concurrent chemoradiation with weekly carboplatin and paclitaxel is status post 3 cycles. Overall she's tolerating her course of concurrent chemoradiation relatively well. Patient was discussed with and also seen by Dr. Julien Nordmann. She will continue her course of concurrent chemoradiation as scheduled. She'll followup in 3 weeks another symptom management visit. Currently her last fraction of radiation is scheduled for 01/08/2014 and she has chemotherapy scheduled that week on 01/05/2014.   She was advised to call immediately if she has any concerning symptoms in the interval. The patient voices understanding of current disease status and treatment options and is in agreement with the current care plan.  All questions were answered. The patient knows to call the clinic with any problems, questions or concerns. We can certainly see the patient much sooner if necessary.  Carlton Adam, PA-C  ADDENDUM: Hematology/Oncology Attending:  I had a face to face encounter with the patient. I recommended her care plan. This is a very pleasant 73 years old white female with unresectable stage IIIA non-small cell lung cancer currently undergoing a course of concurrent chemoradiation with weekly carboplatin and paclitaxel status post 3 cycles. She is tolerating her treatment fairly well except for mild sore throat and odynophagia. She is currently on treatment with Carafate. I recommended for the patient to continue her current treatment with concurrent chemoradiation as scheduled. She would come back for follow up visit in 2 weeks for reevaluation and management any adverse effect of her treatment. She was advised to call immediately if she has any concerning symptoms in the interval.  Disclaimer: This note was dictated with voice recognition  software. Similar sounding words can inadvertently be transcribed and may not be corrected upon review. Curt Bears, MD 12/20/2013

## 2013-12-19 NOTE — Patient Instructions (Signed)
Continue her course of concurrent chemoradiation as scheduled Followup in 3 week for another symptom management visit

## 2013-12-19 NOTE — Telephone Encounter (Signed)
Gave pt appt for lab,md and chemo for April and MAy 2015

## 2013-12-19 NOTE — Telephone Encounter (Signed)
Per staff message and POF I have scheduled appts.  JMW  

## 2013-12-22 ENCOUNTER — Ambulatory Visit
Admission: RE | Admit: 2013-12-22 | Discharge: 2013-12-22 | Disposition: A | Discharge status: Home / Self Care | Payer: Medicare Other | Source: Ambulatory Visit | Attending: Radiation Oncology | Admitting: Radiation Oncology

## 2013-12-22 ENCOUNTER — Other Ambulatory Visit: Payer: Medicare Other

## 2013-12-22 ENCOUNTER — Ambulatory Visit (HOSPITAL_BASED_OUTPATIENT_CLINIC_OR_DEPARTMENT_OTHER): Payer: Medicare Other

## 2013-12-22 VITALS — BP 122/80 | HR 87 | Temp 98.0°F | Resp 18

## 2013-12-22 DIAGNOSIS — C342 Malignant neoplasm of middle lobe, bronchus or lung: Secondary | ICD-10-CM

## 2013-12-22 DIAGNOSIS — Z5111 Encounter for antineoplastic chemotherapy: Secondary | ICD-10-CM

## 2013-12-22 MED ORDER — DIPHENHYDRAMINE HCL 50 MG/ML IJ SOLN
INTRAMUSCULAR | Status: AC
  Filled 2013-12-22: qty 1

## 2013-12-22 MED ORDER — DEXAMETHASONE SODIUM PHOSPHATE 20 MG/5ML IJ SOLN
20.0000 mg | Freq: Once | INTRAMUSCULAR | Status: AC
  Administered 2013-12-22: 20 mg via INTRAVENOUS

## 2013-12-22 MED ORDER — FAMOTIDINE IN NACL 20-0.9 MG/50ML-% IV SOLN
INTRAVENOUS | Status: AC
  Filled 2013-12-22: qty 50

## 2013-12-22 MED ORDER — ONDANSETRON 16 MG/50ML IVPB (CHCC)
INTRAVENOUS | Status: AC
  Filled 2013-12-22: qty 16

## 2013-12-22 MED ORDER — ONDANSETRON 16 MG/50ML IVPB (CHCC)
16.0000 mg | Freq: Once | INTRAVENOUS | Status: AC
  Administered 2013-12-22: 16 mg via INTRAVENOUS

## 2013-12-22 MED ORDER — SODIUM CHLORIDE 0.9 % IV SOLN
45.0000 mg/m2 | Freq: Once | INTRAVENOUS | Status: AC
  Administered 2013-12-22: 78 mg via INTRAVENOUS
  Filled 2013-12-22: qty 13

## 2013-12-22 MED ORDER — SODIUM CHLORIDE 0.9 % IV SOLN
172.2000 mg | Freq: Once | INTRAVENOUS | Status: AC
  Administered 2013-12-22: 170 mg via INTRAVENOUS
  Filled 2013-12-22: qty 17

## 2013-12-22 MED ORDER — FAMOTIDINE IN NACL 20-0.9 MG/50ML-% IV SOLN
20.0000 mg | Freq: Once | INTRAVENOUS | Status: AC
  Administered 2013-12-22: 20 mg via INTRAVENOUS

## 2013-12-22 MED ORDER — DIPHENHYDRAMINE HCL 50 MG/ML IJ SOLN
50.0000 mg | Freq: Once | INTRAMUSCULAR | Status: AC
  Administered 2013-12-22: 50 mg via INTRAVENOUS

## 2013-12-22 MED ORDER — DEXAMETHASONE SODIUM PHOSPHATE 20 MG/5ML IJ SOLN
INTRAMUSCULAR | Status: AC
  Filled 2013-12-22: qty 5

## 2013-12-22 MED ORDER — SODIUM CHLORIDE 0.9 % IV SOLN
Freq: Once | INTRAVENOUS | Status: AC
  Administered 2013-12-22: 11:00:00 via INTRAVENOUS

## 2013-12-22 NOTE — Patient Instructions (Signed)
[facility] Discharge Instructions for Patients Receiving Chemotherapy  Today you received the following chemotherapy agents: Taxol and Carboplatin.  To help prevent nausea and vomiting after your treatment, we encourage you to take your nausea medication as prescribed.   If you develop nausea and vomiting that is not controlled by your nausea medication, call the clinic.   BELOW ARE SYMPTOMS THAT SHOULD BE REPORTED IMMEDIATELY:  *FEVER GREATER THAN 100.5 F  *CHILLS WITH OR WITHOUT FEVER  NAUSEA AND VOMITING THAT IS NOT CONTROLLED WITH YOUR NAUSEA MEDICATION  *UNUSUAL SHORTNESS OF BREATH  *UNUSUAL BRUISING OR BLEEDING  TENDERNESS IN MOUTH AND THROAT WITH OR WITHOUT PRESENCE OF ULCERS  *URINARY PROBLEMS  *BOWEL PROBLEMS  UNUSUAL RASH Items with * indicate a potential emergency and should be followed up as soon as possible.  Feel free to call the clinic you have any questions or concerns. The clinic phone number is [phone].    

## 2013-12-23 ENCOUNTER — Ambulatory Visit
Admission: RE | Admit: 2013-12-23 | Discharge: 2013-12-23 | Disposition: A | Discharge status: Home / Self Care | Payer: Medicare Other | Source: Ambulatory Visit | Attending: Radiation Oncology | Admitting: Radiation Oncology

## 2013-12-23 VITALS — BP 129/83 | HR 76 | Temp 97.9°F | Ht 60.0 in | Wt 164.6 lb

## 2013-12-23 DIAGNOSIS — C342 Malignant neoplasm of middle lobe, bronchus or lung: Secondary | ICD-10-CM

## 2013-12-23 NOTE — Progress Notes (Signed)
  Radiation Oncology         [phone] ________________________________  Name: Kimberly [name] MRN: [id number]  Date: 12/23/2013  DOB: 09/25/1940  Weekly Radiation Therapy Management  Current Dose: 28.8 Gy     Planned Dose:  50.4 Gy  Narrative . . . . . . . . The patient presents for routine under treatment assessment.                                   The patient is without complaint. Her cough is better. She denies any hemoptysis. She does have dysphagia but no odynophagia.  She continues to take Carafate.  She is chewing her food well.                                 Set-up films were reviewed.                                 The chart was checked. Physical Findings. . .  height is 5' (1.524 m) and weight is 164 lb 9.6 oz (74.662 kg). Her temperature is 97.9 F (36.6 C). Her blood pressure is 129/83 and her pulse is 76. Her oxygen saturation is 100%. . Weight essentially stable.  No significant changes. Impression . . . . . . . The patient is tolerating radiation. Plan . . . . . . . . . . . . Continue treatment as planned.  ________________________________   Blair Promise, PhD, MD

## 2013-12-23 NOTE — Progress Notes (Signed)
Kimberly [name] has had 16 fractions to her right chest.  She denies pain.  She reports her cough is better and is not brining up as much phlegm as before.  She denies shortness of breath.  She denies hemoptysis.  She reports trouble swallowing with food getting stuck in her throat.  She is taking Carafate.  She reports a poor appetite but is making herself eat.  Her weight is stable from 4/14.  She had chemotherapy yesterday.  She reports some nausea.  She reports fatigue.  She has a rash on her arms and lower legs.  She said it started on Saturday.  She reports they gave her steroids for it during chemo yesterday.

## 2013-12-24 ENCOUNTER — Ambulatory Visit
Admission: RE | Admit: 2013-12-24 | Discharge: 2013-12-24 | Disposition: A | Discharge status: Home / Self Care | Payer: Medicare Other | Source: Ambulatory Visit | Attending: Radiation Oncology | Admitting: Radiation Oncology

## 2013-12-25 ENCOUNTER — Ambulatory Visit
Admission: RE | Admit: 2013-12-25 | Discharge: 2013-12-25 | Disposition: A | Discharge status: Home / Self Care | Payer: Medicare Other | Source: Ambulatory Visit | Attending: Radiation Oncology | Admitting: Radiation Oncology

## 2013-12-26 ENCOUNTER — Ambulatory Visit
Admission: RE | Admit: 2013-12-26 | Discharge: 2013-12-26 | Disposition: A | Discharge status: Home / Self Care | Payer: Medicare Other | Source: Ambulatory Visit | Attending: Radiation Oncology | Admitting: Radiation Oncology

## 2013-12-29 ENCOUNTER — Ambulatory Visit (HOSPITAL_BASED_OUTPATIENT_CLINIC_OR_DEPARTMENT_OTHER): Payer: Medicare Other

## 2013-12-29 ENCOUNTER — Other Ambulatory Visit (HOSPITAL_BASED_OUTPATIENT_CLINIC_OR_DEPARTMENT_OTHER): Payer: Medicare Other

## 2013-12-29 ENCOUNTER — Ambulatory Visit
Admission: RE | Admit: 2013-12-29 | Discharge: 2013-12-29 | Disposition: A | Discharge status: Home / Self Care | Payer: Medicare Other | Source: Ambulatory Visit | Attending: Radiation Oncology | Admitting: Radiation Oncology

## 2013-12-29 DIAGNOSIS — C342 Malignant neoplasm of middle lobe, bronchus or lung: Secondary | ICD-10-CM

## 2013-12-29 DIAGNOSIS — C3491 Malignant neoplasm of unspecified part of right bronchus or lung: Secondary | ICD-10-CM

## 2013-12-29 DIAGNOSIS — Z5111 Encounter for antineoplastic chemotherapy: Secondary | ICD-10-CM

## 2013-12-29 LAB — COMPREHENSIVE METABOLIC PANEL (CC13)
ALK PHOS: 101 U/L (ref 40–150)
ALT: 20 U/L (ref 0–55)
ANION GAP: 13 meq/L — AB (ref 3–11)
AST: 19 U/L (ref 5–34)
Albumin: 3.7 g/dL (ref 3.5–5.0)
BILIRUBIN TOTAL: 0.55 mg/dL (ref 0.20–1.20)
BUN: 21 mg/dL (ref 7.0–26.0)
CO2: 25 mEq/L (ref 22–29)
Calcium: 10 mg/dL (ref 8.4–10.4)
Chloride: 102 mEq/L (ref 98–109)
Creatinine: 1.1 mg/dL (ref 0.6–1.1)
Glucose: 120 mg/dl (ref 70–140)
Potassium: 3.6 mEq/L (ref 3.5–5.1)
SODIUM: 140 meq/L (ref 136–145)
TOTAL PROTEIN: 7.2 g/dL (ref 6.4–8.3)

## 2013-12-29 LAB — CBC WITH DIFFERENTIAL/PLATELET
BASO%: 0.9 % (ref 0.0–2.0)
Basophils Absolute: 0 10*3/uL (ref 0.0–0.1)
EOS%: 2.5 % (ref 0.0–7.0)
Eosinophils Absolute: 0.1 10*3/uL (ref 0.0–0.5)
HCT: 31.6 % — ABNORMAL LOW (ref 34.8–46.6)
HGB: 10.6 g/dL — ABNORMAL LOW (ref 11.6–15.9)
LYMPH%: 11.8 % — AB (ref 14.0–49.7)
MCH: 29.9 pg (ref 25.1–34.0)
MCHC: 33.5 g/dL (ref 31.5–36.0)
MCV: 89.3 fL (ref 79.5–101.0)
MONO#: 0.3 10*3/uL (ref 0.1–0.9)
MONO%: 7.4 % (ref 0.0–14.0)
NEUT#: 3.3 10*3/uL (ref 1.5–6.5)
NEUT%: 77.4 % — ABNORMAL HIGH (ref 38.4–76.8)
NRBC: 0 % (ref 0–0)
PLATELETS: 209 10*3/uL (ref 145–400)
RBC: 3.54 10*6/uL — AB (ref 3.70–5.45)
RDW: 13.3 % (ref 11.2–14.5)
WBC: 4.3 10*3/uL (ref 3.9–10.3)
lymph#: 0.5 10*3/uL — ABNORMAL LOW (ref 0.9–3.3)

## 2013-12-29 MED ORDER — DEXAMETHASONE SODIUM PHOSPHATE 20 MG/5ML IJ SOLN
INTRAMUSCULAR | Status: AC
  Filled 2013-12-29: qty 5

## 2013-12-29 MED ORDER — DIPHENHYDRAMINE HCL 50 MG/ML IJ SOLN
50.0000 mg | Freq: Once | INTRAMUSCULAR | Status: AC
Start: 2013-12-29 — End: 2013-12-29
  Administered 2013-12-29: 50 mg via INTRAVENOUS

## 2013-12-29 MED ORDER — SODIUM CHLORIDE 0.9 % IV SOLN
Freq: Once | INTRAVENOUS | Status: AC
  Administered 2013-12-29: 12:00:00 via INTRAVENOUS

## 2013-12-29 MED ORDER — FAMOTIDINE IN NACL 20-0.9 MG/50ML-% IV SOLN
20.0000 mg | Freq: Once | INTRAVENOUS | Status: AC
  Administered 2013-12-29: 20 mg via INTRAVENOUS

## 2013-12-29 MED ORDER — DIPHENHYDRAMINE HCL 50 MG/ML IJ SOLN
INTRAMUSCULAR | Status: AC
  Filled 2013-12-29: qty 1

## 2013-12-29 MED ORDER — SODIUM CHLORIDE 0.9 % IV SOLN
172.2000 mg | Freq: Once | INTRAVENOUS | Status: AC
  Administered 2013-12-29: 170 mg via INTRAVENOUS
  Filled 2013-12-29: qty 17

## 2013-12-29 MED ORDER — FAMOTIDINE IN NACL 20-0.9 MG/50ML-% IV SOLN
INTRAVENOUS | Status: AC
  Filled 2013-12-29: qty 50

## 2013-12-29 MED ORDER — ONDANSETRON 16 MG/50ML IVPB (CHCC)
INTRAVENOUS | Status: AC
  Filled 2013-12-29: qty 16

## 2013-12-29 MED ORDER — PACLITAXEL CHEMO INJECTION 300 MG/50ML
45.0000 mg/m2 | Freq: Once | INTRAVENOUS | Status: AC
  Administered 2013-12-29: 78 mg via INTRAVENOUS
  Filled 2013-12-29: qty 13

## 2013-12-29 MED ORDER — ONDANSETRON 16 MG/50ML IVPB (CHCC)
16.0000 mg | Freq: Once | INTRAVENOUS | Status: AC
  Administered 2013-12-29: 16 mg via INTRAVENOUS

## 2013-12-29 MED ORDER — DEXAMETHASONE SODIUM PHOSPHATE 20 MG/5ML IJ SOLN
20.0000 mg | Freq: Once | INTRAMUSCULAR | Status: AC
  Administered 2013-12-29: 20 mg via INTRAVENOUS

## 2013-12-29 NOTE — Patient Instructions (Signed)
[facility] Discharge Instructions for Patients Receiving Chemotherapy  Today you received the following chemotherapy agents: taxol, carboplatin   To help prevent nausea and vomiting after your treatment, we encourage you to take your nausea medication.  Take it as often as prescribed.     If you develop nausea and vomiting that is not controlled by your nausea medication, call the clinic. If it is after clinic hours your family physician or the after hours number for the clinic or go to the Emergency Department.   BELOW ARE SYMPTOMS THAT SHOULD BE REPORTED IMMEDIATELY:  *FEVER GREATER THAN 100.5 F  *CHILLS WITH OR WITHOUT FEVER  NAUSEA AND VOMITING THAT IS NOT CONTROLLED WITH YOUR NAUSEA MEDICATION  *UNUSUAL SHORTNESS OF BREATH  *UNUSUAL BRUISING OR BLEEDING  TENDERNESS IN MOUTH AND THROAT WITH OR WITHOUT PRESENCE OF ULCERS  *URINARY PROBLEMS  *BOWEL PROBLEMS  UNUSUAL RASH Items with * indicate a potential emergency and should be followed up as soon as possible.  Feel free to call the clinic you have any questions or concerns. The clinic phone number is [phone].   I have been informed and understand all the instructions given to me. I know to contact the clinic, my physician, or go to the Emergency Department if any problems should occur. I do not have any questions at this time, but understand that I may call the clinic during office hours   should I have any questions or need assistance in obtaining follow up care.    __________________________________________  _____________  __________ Signature of Patient or Authorized Representative            Date                   Time    __________________________________________ Nurse's Signature

## 2013-12-30 ENCOUNTER — Ambulatory Visit
Admission: RE | Admit: 2013-12-30 | Discharge: 2013-12-30 | Disposition: A | Discharge status: Home / Self Care | Payer: Medicare Other | Source: Ambulatory Visit | Attending: Radiation Oncology | Admitting: Radiation Oncology

## 2013-12-30 ENCOUNTER — Telehealth: Payer: Self-pay | Admitting: Oncology

## 2013-12-30 ENCOUNTER — Telehealth: Payer: Self-pay | Admitting: *Deleted

## 2013-12-30 VITALS — BP 139/67 | HR 90 | Temp 98.0°F | Ht 60.0 in | Wt 160.9 lb

## 2013-12-30 DIAGNOSIS — C342 Malignant neoplasm of middle lobe, bronchus or lung: Secondary | ICD-10-CM

## 2013-12-30 NOTE — Telephone Encounter (Signed)
Per nurse in radiation I have scheduled appt

## 2013-12-30 NOTE — Progress Notes (Signed)
  Radiation Oncology         [phone] ________________________________  Name: [name] MRN: [id number]  Date: 12/30/2013  DOB: [birth year]  Weekly Radiation Therapy Management  Lung cancer, middle lobe   Primary site: Lung (Right)   Staging method: AJCC 7th Edition   Clinical: Stage IIIA (T3, N2, M0)    Summary: Stage IIIA (T3, N2, M0)  Current Dose: 37.8 Gy     Planned Dose:  50.4 Gy directed at the right chest area  Narrative . . . . . . . . The patient presents for routine under treatment assessment.                                   The patient is without complaint except for more difficulties getting in fluids. She has to sip liquids slowly. She has back on her oral intake in light of this issue. She does report some dizziness with standing.  She has a poor taste for food.                                 Set-up films were reviewed.                                 The chart was checked. Physical Findings. . .  height is 5' (1.524 m) and weight is 160 lb 14.4 oz (72.984 kg). Her temperature is 98 F (36.7 C). Her blood pressure is 139/67 and her pulse is 90. Her oxygen saturation is 100%. . She has lost 4 pounds since last week.  Impression . . . . . . . The patient is tolerating radiation. Plan . . . . . . . . . . . . Continue treatment as planned.  She will be set up for IV fluid supplementation tomorrow with her decreased intake, symptoms and orthostatic blood pressure changes.  ________________________________   Blair Promise, PhD, MD

## 2013-12-30 NOTE — Progress Notes (Signed)
Kimberly [name] has had 21 fractions to her right chest.  She denies pain.  She does have a sore throat with swallowing.  She reports having trouble getting fluids in because she has to swallow small amounts at a time.  She continues to take Carafate.  She reports that her cough has returned and is keeping her up at night.  She is taking tussionex.  She denies coughing up blood but brings up a lot of phlegm.  She reports an increase in shortness of breath with activity.  Her oxygen saturation today is 100% on room air.  She reports a poor appetite and has lost 4 lbs since 4/21.  Orthostatic vitals done: bp sitting 139/67, hr 90, bp standing 122/69, hr 97.  She reports fatigue.  She last had chemotherapy on Monday.  She reports her skin is intact in the treatment area.  She continues to have a scattered, red rash on her lower arms and legs from chemotherapy.

## 2013-12-30 NOTE — Telephone Encounter (Signed)
Called Kimberly [name] and let her know that her radiation treatment time for tomorrow has been moved to 2:30 and then her IV fluids appointment is at 3:15.  Daisja verbalized agreement.

## 2013-12-31 ENCOUNTER — Ambulatory Visit
Admission: RE | Admit: 2013-12-31 | Discharge: 2013-12-31 | Disposition: A | Discharge status: Home / Self Care | Payer: Medicare Other | Source: Ambulatory Visit | Attending: Radiation Oncology | Admitting: Radiation Oncology

## 2013-12-31 ENCOUNTER — Ambulatory Visit (HOSPITAL_BASED_OUTPATIENT_CLINIC_OR_DEPARTMENT_OTHER): Payer: Medicare Other

## 2013-12-31 VITALS — BP 144/73 | HR 92 | Temp 97.2°F

## 2013-12-31 DIAGNOSIS — C342 Malignant neoplasm of middle lobe, bronchus or lung: Secondary | ICD-10-CM

## 2013-12-31 DIAGNOSIS — E860 Dehydration: Secondary | ICD-10-CM

## 2013-12-31 MED ORDER — SODIUM CHLORIDE 0.9 % IV SOLN
Freq: Once | INTRAVENOUS | Status: AC
  Administered 2013-12-31: 16:00:00 via INTRAVENOUS

## 2013-12-31 NOTE — Patient Instructions (Signed)
Dehydration, Adult Dehydration is when you lose more fluids from the body than you take in. Vital organs like the kidneys, brain, and heart cannot function without a proper amount of fluids and salt. Any loss of fluids from the body can cause dehydration.  CAUSES   Vomiting.  Diarrhea.  Excessive sweating.  Excessive urine output.  Fever.  Poor po intake due to chemotherapy/radiation SYMPTOMS  Mild dehydration  Thirst.  Dry lips.  Slightly dry mouth. Moderate dehydration  Very dry mouth.  Sunken eyes.  Skin does not bounce back quickly when lightly pinched and released.  Dark urine and decreased urine production.  Decreased tear production.  Headache. Severe dehydration  Very dry mouth.  Extreme thirst.  Rapid, weak pulse (more than 100 beats per minute at rest).  Cold hands and feet.  Not able to sweat in spite of heat and temperature.  Rapid breathing.  Blue lips.  Confusion and lethargy.  Difficulty being awakened.  Minimal urine production.  No tears. DIAGNOSIS  Your caregiver will diagnose dehydration based on your symptoms and your exam. Blood and urine tests will help confirm the diagnosis. The diagnostic evaluation should also identify the cause of dehydration. TREATMENT  Treatment of mild or moderate dehydration can often be done at home by increasing the amount of fluids that you drink. It is best to drink small amounts of fluid more often. Drinking too much at one time can make vomiting worse. Refer to the home care instructions below. Severe dehydration needs to be treated at the hospital where you will probably be given intravenous (IV) fluids that contain water and electrolytes. HOME CARE INSTRUCTIONS   Ask your caregiver about specific rehydration instructions.  Drink enough fluids to keep your urine clear or pale yellow.  Drink small amounts frequently if you have nausea and vomiting.  Eat as you normally do.  Avoid:  Foods  or drinks high in sugar.  Carbonated drinks.  Juice.  Extremely hot or cold fluids.  Drinks with caffeine.  Fatty, greasy foods.  Alcohol.  Tobacco.  Overeating.  Gelatin desserts.  Wash your hands well to avoid spreading bacteria and viruses.  Only take over-the-counter or prescription medicines for pain, discomfort, or fever as directed by your caregiver.  Ask your caregiver if you should continue all prescribed and over-the-counter medicines.  Keep all follow-up appointments with your caregiver. SEEK MEDICAL CARE IF:  You have abdominal pain and it increases or stays in one area (localizes).  You have a rash, stiff neck, or severe headache.  You are irritable, sleepy, or difficult to awaken.  You are weak, dizzy, or extremely thirsty. SEEK IMMEDIATE MEDICAL CARE IF:   You are unable to keep fluids down or you get worse despite treatment.  You have frequent episodes of vomiting or diarrhea.  You have blood or green matter (bile) in your vomit.  You have blood in your stool or your stool looks black and tarry.  You have not urinated in 6 to 8 hours, or you have only urinated a small amount of very dark urine.  You have a fever.  You faint. MAKE SURE YOU:   Understand these instructions.  Will watch your condition.  Will get help right away if you are not doing well or get worse. Document Released: 08/21/2005 Document Revised: 11/13/2011 Document Reviewed: 04/10/2011 [facility] Patient Information 2014 [place], [place].

## 2014-01-01 ENCOUNTER — Ambulatory Visit
Admission: RE | Admit: 2014-01-01 | Discharge: 2014-01-01 | Disposition: A | Discharge status: Home / Self Care | Payer: Medicare Other | Source: Ambulatory Visit | Attending: Radiation Oncology | Admitting: Radiation Oncology

## 2014-01-02 ENCOUNTER — Ambulatory Visit
Admission: RE | Admit: 2014-01-02 | Discharge: 2014-01-02 | Disposition: A | Discharge status: Home / Self Care | Payer: Medicare Other | Source: Ambulatory Visit | Attending: Radiation Oncology | Admitting: Radiation Oncology

## 2014-01-05 ENCOUNTER — Ambulatory Visit
Admission: RE | Admit: 2014-01-05 | Discharge: 2014-01-05 | Disposition: A | Discharge status: Home / Self Care | Payer: Medicare Other | Source: Ambulatory Visit | Attending: Radiation Oncology | Admitting: Radiation Oncology

## 2014-01-05 ENCOUNTER — Encounter: Payer: Self-pay | Admitting: Physician Assistant

## 2014-01-05 ENCOUNTER — Other Ambulatory Visit (HOSPITAL_BASED_OUTPATIENT_CLINIC_OR_DEPARTMENT_OTHER): Payer: Medicare Other

## 2014-01-05 ENCOUNTER — Ambulatory Visit (HOSPITAL_BASED_OUTPATIENT_CLINIC_OR_DEPARTMENT_OTHER): Payer: Medicare Other | Admitting: Physician Assistant

## 2014-01-05 ENCOUNTER — Other Ambulatory Visit: Payer: Self-pay | Admitting: Medical Oncology

## 2014-01-05 ENCOUNTER — Ambulatory Visit (HOSPITAL_BASED_OUTPATIENT_CLINIC_OR_DEPARTMENT_OTHER): Payer: Medicare Other

## 2014-01-05 VITALS — BP 164/79 | HR 84

## 2014-01-05 VITALS — BP 132/71 | HR 106 | Temp 98.1°F | Resp 19 | Ht 60.0 in | Wt 156.1 lb

## 2014-01-05 DIAGNOSIS — Z5111 Encounter for antineoplastic chemotherapy: Secondary | ICD-10-CM

## 2014-01-05 DIAGNOSIS — C3491 Malignant neoplasm of unspecified part of right bronchus or lung: Secondary | ICD-10-CM

## 2014-01-05 DIAGNOSIS — B370 Candidal stomatitis: Secondary | ICD-10-CM

## 2014-01-05 DIAGNOSIS — C342 Malignant neoplasm of middle lobe, bronchus or lung: Secondary | ICD-10-CM

## 2014-01-05 DIAGNOSIS — R21 Rash and other nonspecific skin eruption: Secondary | ICD-10-CM

## 2014-01-05 LAB — CBC WITH DIFFERENTIAL/PLATELET
BASO%: 0.5 % (ref 0.0–2.0)
Basophils Absolute: 0 10*3/uL (ref 0.0–0.1)
EOS ABS: 0 10*3/uL (ref 0.0–0.5)
EOS%: 1.9 % (ref 0.0–7.0)
HCT: 31 % — ABNORMAL LOW (ref 34.8–46.6)
HGB: 10.6 g/dL — ABNORMAL LOW (ref 11.6–15.9)
LYMPH#: 0.3 10*3/uL — AB (ref 0.9–3.3)
LYMPH%: 11.4 % — ABNORMAL LOW (ref 14.0–49.7)
MCH: 30.6 pg (ref 25.1–34.0)
MCHC: 34.2 g/dL (ref 31.5–36.0)
MCV: 89.6 fL (ref 79.5–101.0)
MONO#: 0.1 10*3/uL (ref 0.1–0.9)
MONO%: 4.8 % (ref 0.0–14.0)
NEUT%: 81.4 % — ABNORMAL HIGH (ref 38.4–76.8)
NEUTROS ABS: 1.9 10*3/uL (ref 1.5–6.5)
PLATELETS: 127 10*3/uL — AB (ref 145–400)
RBC: 3.46 10*6/uL — ABNORMAL LOW (ref 3.70–5.45)
RDW: 13.5 % (ref 11.2–14.5)
WBC: 2.3 10*3/uL — AB (ref 3.9–10.3)

## 2014-01-05 LAB — COMPREHENSIVE METABOLIC PANEL (CC13)
ALK PHOS: 86 U/L (ref 40–150)
ALT: 16 U/L (ref 0–55)
ANION GAP: 11 meq/L (ref 3–11)
AST: 18 U/L (ref 5–34)
Albumin: 3.5 g/dL (ref 3.5–5.0)
BILIRUBIN TOTAL: 0.73 mg/dL (ref 0.20–1.20)
BUN: 15.9 mg/dL (ref 7.0–26.0)
CO2: 25 meq/L (ref 22–29)
Calcium: 10 mg/dL (ref 8.4–10.4)
Chloride: 100 mEq/L (ref 98–109)
Creatinine: 1 mg/dL (ref 0.6–1.1)
Glucose: 120 mg/dl (ref 70–140)
Potassium: 4 mEq/L (ref 3.5–5.1)
Sodium: 137 mEq/L (ref 136–145)
Total Protein: 6.7 g/dL (ref 6.4–8.3)

## 2014-01-05 MED ORDER — FLUCONAZOLE 100 MG PO TABS
ORAL_TABLET | ORAL | Status: DC
Start: 2014-01-05 — End: 2014-02-16

## 2014-01-05 MED ORDER — FAMOTIDINE IN NACL 20-0.9 MG/50ML-% IV SOLN
20.0000 mg | Freq: Once | INTRAVENOUS | Status: AC
  Administered 2014-01-05: 20 mg via INTRAVENOUS

## 2014-01-05 MED ORDER — SODIUM CHLORIDE 0.9 % IV SOLN
172.2000 mg | Freq: Once | INTRAVENOUS | Status: AC
  Administered 2014-01-05: 170 mg via INTRAVENOUS
  Filled 2014-01-05: qty 17

## 2014-01-05 MED ORDER — DIPHENHYDRAMINE HCL 50 MG/ML IJ SOLN
INTRAMUSCULAR | Status: AC
  Filled 2014-01-05: qty 1

## 2014-01-05 MED ORDER — SODIUM CHLORIDE 0.9 % IV SOLN
INTRAVENOUS | Status: DC
  Administered 2014-01-05: 10:00:00 via INTRAVENOUS

## 2014-01-05 MED ORDER — ONDANSETRON 16 MG/50ML IVPB (CHCC)
INTRAVENOUS | Status: AC
  Filled 2014-01-05: qty 16

## 2014-01-05 MED ORDER — FAMOTIDINE IN NACL 20-0.9 MG/50ML-% IV SOLN
INTRAVENOUS | Status: AC
  Filled 2014-01-05: qty 50

## 2014-01-05 MED ORDER — DEXAMETHASONE SODIUM PHOSPHATE 20 MG/5ML IJ SOLN
INTRAMUSCULAR | Status: AC
  Filled 2014-01-05: qty 5

## 2014-01-05 MED ORDER — SODIUM CHLORIDE 0.9 % IV SOLN
Freq: Once | INTRAVENOUS | Status: AC
  Administered 2014-01-05: 10:00:00 via INTRAVENOUS

## 2014-01-05 MED ORDER — DIPHENHYDRAMINE HCL 50 MG/ML IJ SOLN
50.0000 mg | Freq: Once | INTRAMUSCULAR | Status: AC
  Administered 2014-01-05: 50 mg via INTRAVENOUS

## 2014-01-05 MED ORDER — SUCRALFATE 1 GM/10ML PO SUSP: 1.0000 g | Freq: Three times a day (TID) | ORAL | Status: DC

## 2014-01-05 MED ORDER — DEXAMETHASONE SODIUM PHOSPHATE 20 MG/5ML IJ SOLN
20.0000 mg | Freq: Once | INTRAMUSCULAR | Status: AC
  Administered 2014-01-05: 20 mg via INTRAVENOUS

## 2014-01-05 MED ORDER — ONDANSETRON 16 MG/50ML IVPB (CHCC)
16.0000 mg | Freq: Once | INTRAVENOUS | Status: AC
  Administered 2014-01-05: 16 mg via INTRAVENOUS

## 2014-01-05 MED ORDER — DEXTROSE 5 % IV SOLN
45.0000 mg/m2 | Freq: Once | INTRAVENOUS | Status: AC
  Administered 2014-01-05: 78 mg via INTRAVENOUS
  Filled 2014-01-05: qty 13

## 2014-01-05 NOTE — Patient Instructions (Signed)
[facility] Discharge Instructions for Patients Receiving Chemotherapy  Today you received the following chemotherapy agents Taxol/Carboplatin To help prevent nausea and vomiting after your treatment, we encourage you to take your nausea medication as prescribed.  If you develop nausea and vomiting that is not controlled by your nausea medication, call the clinic.   BELOW ARE SYMPTOMS THAT SHOULD BE REPORTED IMMEDIATELY:  *FEVER GREATER THAN 100.5 F  *CHILLS WITH OR WITHOUT FEVER  NAUSEA AND VOMITING THAT IS NOT CONTROLLED WITH YOUR NAUSEA MEDICATION  *UNUSUAL SHORTNESS OF BREATH  *UNUSUAL BRUISING OR BLEEDING  TENDERNESS IN MOUTH AND THROAT WITH OR WITHOUT PRESENCE OF ULCERS  *URINARY PROBLEMS  *BOWEL PROBLEMS  UNUSUAL RASH Items with * indicate a potential emergency and should be followed up as soon as possible.  Feel free to call the clinic you have any questions or concerns. The clinic phone number is [phone].    

## 2014-01-05 NOTE — Patient Instructions (Signed)
Complete your course of concurrent chemoradiation as scheduled Take the Diflucan as prescribed to treat the thrush in her mouth however while you're taking the Diflucan do not take your cholesterol medicine Followup with Dr. Julien Nordmann in approximately 6 weeks with restaging CT scan of the chest to reevaluate your disease

## 2014-01-05 NOTE — Progress Notes (Signed)
[place] [phone]   [phone]  SHARED VISIT PROGRESS NOTE  [name], MD [address] 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  PRIOR THERAPY: On 11/13/2013 the patient underwent exploratory right VATS with mediastinal biopsy under the care of Dr. Roxan Hockey.  CURRENT THERAPY: Concurrent chemoradiation with weekly carboplatin for AUC of 2 and paclitaxel 45 mg/M2, status post 5 cycles. First cycle was given on 12/01/2013.  INTERVAL HISTORY: Kimberly [name] 73 y.o. female returns to the clinic today for a symptom management visit prior to cycle #6. She is accompanied by her son, Kimberly [name], today. She reports having significant difficulty with swallowing and has lost approximately 5 pounds since her last office visit. She is less of her Carafate this morning. Her water intake has decreased from a little over 64 ounces daily to barely 16 ounces daily. She continues to have a rash on her previously sun exposed areas. The skin lesions are not itching but are just more prominent. The patient is tolerating her course of concurrent chemoradiation relatively well with the exception of some fatigue. She voiced no other specific complaints. She denied having any significant nausea or vomiting, no fever or chills. She denied having any chest pain but continues to have shortness of breath with exertion with no hemoptysis. She did receive some IV fluids last week due to her decreased by mouth fluid intake and requests IV fluids this week as well.  MEDICAL HISTORY: Past Medical History  Diagnosis Date  . Hypertension   . Thyroid disease   . GERD (gastroesophageal reflux disease)   . Cancer   . Dyslipidemia   . Aortic insufficiency   . Mitral insufficiency   . History of nuclear stress test 02/26/2006     exercise myoview; normal pattern of perfusion; low risk scan   . PONV (postoperative nausea and vomiting)   . Heart murmur   . Hypothyroidism   . Cough     dry, endobronchial mass  . Pneumonia     pus bronchitis  . Arthritis   . Syncope     "states she has passed out a few times. dr is trying to find cause.last time when geeting ready to go home after video bronch/bx  . Shortness of breath     with exertion  . Anxiety     due to surgery   . Bronchitis     ALLERGIES:  is allergic to milk-related compounds.  MEDICATIONS:  Current Outpatient Prescriptions  Medication Sig Dispense Refill  . atorvastatin (LIPITOR) 20 MG tablet Take 20 mg by mouth daily.      . Bisacodyl (DULCOLAX PO) Take 2 tablets by mouth once.      . hyaluronate sodium (RADIAPLEXRX) GEL Apply 1 application topically 2 (two) times daily.      . irbesartan-hydrochlorothiazide (AVALIDE) 300-12.5 MG per tablet Take 1 tablet by mouth daily.      [name] levothyroxine (SYNTHROID, LEVOTHROID) 125 MCG tablet Take 125 mcg by mouth daily before breakfast.      . metoprolol succinate (TOPROL-XL) 25 MG 24 hr tablet Take 12.5 mg by mouth daily.      . montelukast (SINGULAIR) 10 MG tablet Take 10 mg by mouth daily.      . pantoprazole (PROTONIX) 40 MG tablet Take 40 mg by mouth 2 (two) times daily.       [name]  prochlorperazine (COMPAZINE) 10 MG tablet Take 1 tablet (10 mg total) by mouth every 6 (six) hours as needed for nausea or vomiting.  30 tablet  1  . ranitidine (ZANTAC) 300 MG tablet Take 300 mg by mouth at bedtime.      . sucralfate (CARAFATE) 1 GM/10ML suspension Take 10 mLs (1 g total) by mouth 4 (four) times daily -  with meals and at bedtime.  420 mL  0  . traMADol (ULTRAM) 50 MG tablet Take 25 mg by mouth every 6 (six) hours as needed for moderate pain.      . chlorpheniramine-HYDROcodone (TUSSIONEX) 10-8 MG/5ML LQCR Take 5 mLs by mouth at bedtime as needed for cough.  115 mL  0  . EPINEPHrine (EPIPEN IJ) Inject 1 each as  directed once as needed (for reaction to milk products).       No current facility-administered medications for this visit.   Facility-Administered Medications Ordered in Other Visits  Medication Dose Route Frequency Provider Last Rate Last Dose  . 0.9 %  sodium chloride infusion   Intravenous Once Curt Bears, MD      . CARBOplatin (PARAPLATIN) 170 mg in sodium chloride 0.9 % 100 mL chemo infusion  170 mg Intravenous Once Curt Bears, MD      . Dexamethasone Sodium Phosphate (DECADRON) injection 20 mg  20 mg Intravenous Once Curt Bears, MD      . diphenhydrAMINE (BENADRYL) injection 50 mg  50 mg Intravenous Once Curt Bears, MD      . famotidine (PEPCID) IVPB 20 mg  20 mg Intravenous Once Curt Bears, MD      . ondansetron (ZOFRAN) IVPB 16 mg  16 mg Intravenous Once Curt Bears, MD      . PACLitaxel (TAXOL) 78 mg in dextrose 5 % 250 mL chemo infusion (</= 8m/m2)  45 mg/m2 (Treatment Plan Actual) Intravenous Once MCurt Bears MD        SURGICAL HISTORY:  Past Surgical History  Procedure Laterality Date  . Thyroidectomy  1973  . Dilation and curettage of uterus    . Carpal tunnel release Right 1988  . Rotator cuff repair  2006    ? side  . H/o met test w/pft  04/02/2012    low risk; peak VO2 77% predicted  . Cardiac catheterization  07/08/2008    normal coronaries  . Transthoracic echocardiogram  09/25/2012    EF 55-60%; mild LVH & mild concentric hypertrophy; mild AV regurg; RV systolic pressure increase consistent with mild pulm HTN  . Video bronchoscopy Bilateral 09/25/2013    Procedure: VIDEO BRONCHOSCOPY WITHOUT FLUORO;  Surgeon: MTanda Rockers MD;  [facility];  Service: Cardiopulmonary;  Laterality: Bilateral;  . Joint replacement  2003    thumb rt  . Knee arthroscopy  12    rt meniscus  . Video bronchoscopy with endobronchial ultrasound N/A 10/30/2013    Procedure: VIDEO BRONCHOSCOPY WITH ENDOBRONCHIAL ULTRASOUND;  Surgeon: SMelrose Nakayama MD;  [facility]  Service: Thoracic;  Laterality: N/A;  . Mediastinoscopy N/A 10/30/2013    Procedure: MEDIASTINOSCOPY;  Surgeon: SMelrose Nakayama MD;  [facility]  Service: Thoracic;  Laterality: N/A;  . Eye surgery Bilateral   . Video assisted thoracoscopy (vats)/ lobectomy Right 11/13/2013    Procedure: VIDEO ASSISTED THORACOSCOPY (VATS) with mediastinal  biopsies;  Surgeon: SMelrose Nakayama MD;  [facility]  Service: Thoracic;  Laterality: Right;  RIGHT VATS,mediastinal biopsies    REVIEW OF SYSTEMS:  Constitutional: positive for fatigue Eyes: negative Ears, nose, mouth, throat, and face: positive for difficulty swallowing Respiratory: positive for dyspnea on exertion Cardiovascular: negative Gastrointestinal: negative Genitourinary:negative Integument/breast: negative Hematologic/lymphatic: negative Musculoskeletal:negative Neurological: negative Behavioral/Psych: negative Endocrine: negative Allergic/Immunologic: negative   PHYSICAL EXAMINATION: General appearance: alert, cooperative, fatigued and no distress Head: Normocephalic, without obvious abnormality, atraumatic Neck: no adenopathy, no JVD, supple, symmetrical, trachea midline and thyroid not enlarged, symmetric, no tenderness/mass/nodules Lymph nodes: Cervical, supraclavicular, and axillary nodes normal. Resp: clear to auscultation bilaterally Back: symmetric, no curvature. ROM normal. No CVA tenderness. Cardio: regular rate and rhythm, S1, S2 normal, no murmur, click, rub or gallop GI: soft, non-tender; bowel sounds normal; no masses,  no organomegaly Extremities: extremities normal, atraumatic, no cyanosis or edema Neurologic: Alert and oriented X 3, normal strength and tone. Normal symmetric reflexes. Normal coordination and gait Mouth: Reveals thrush  ECOG PERFORMANCE STATUS: 1 - Symptomatic but completely ambulatory  Blood pressure 132/71, pulse 106, temperature 98.1 F (36.7  C), temperature source Oral, resp. rate 19, height 5' (1.524 m), weight 156 lb 1.6 oz (70.806 kg).  LABORATORY DATA: Lab Results  Component Value Date   WBC 2.3* 01/05/2014   HGB 10.6* 01/05/2014   HCT 31.0* 01/05/2014   MCV 89.6 01/05/2014   PLT 127* 01/05/2014      Chemistry      Component Value Date/Time   NA 137 01/05/2014 0827   NA 137 11/15/2013 0510   K 4.0 01/05/2014 0827   K 4.0 11/15/2013 0510   CL 100 11/15/2013 0510   CO2 25 01/05/2014 0827   CO2 26 11/15/2013 0510   BUN 15.9 01/05/2014 0827   BUN 12 11/15/2013 0510   CREATININE 1.0 01/05/2014 0827   CREATININE 0.80 11/15/2013 0510      Component Value Date/Time   CALCIUM 10.0 01/05/2014 0827   CALCIUM 8.9 11/15/2013 0510   ALKPHOS 86 01/05/2014 0827   ALKPHOS 86 11/15/2013 0510   AST 18 01/05/2014 0827   AST 11 11/15/2013 0510   ALT 16 01/05/2014 0827   ALT 9 11/15/2013 0510   BILITOT 0.73 01/05/2014 0827   BILITOT 0.4 11/15/2013 0510       RADIOGRAPHIC STUDIES: Dg Chest 2 View  12/02/2013   CLINICAL DATA:  Follow-up VATS right lung  EXAM: CHEST  2 VIEW  COMPARISON:  Prior radiograph from 11/15/2013  FINDINGS: The cardiac and mediastinal silhouettes are stable in size and contour, and remain within normal limits. Previously seen right IJ central venous catheter has been removed.  The lungs are normally inflated. There has been interval improvement in the aeration of the right lung base as compared to prior. Small amount of fluid and/or atelectasis persists along the right minor fissure. No pneumothorax. The left lung is clear. Pulmonary vascularity is normal.  No acute osseous abnormality identified.  IMPRESSION: Interval improvement in aeration of the right lung base. No new acute cardiopulmonary abnormality identified.   Electronically Signed   By: Jeannine Boga M.D.   On: 12/02/2013 12:26   Dg Chest 2 View  11/15/2013   CLINICAL DATA:  Atelectasis  EXAM: CHEST  2 VIEW  COMPARISON:  11/14/1948  FINDINGS: Cardiac shadow is within normal  limits. A right central venous line is again seen and stable. Right lower lobe infiltrate is again identified but mildly improved when compare with the prior study. Some fluid is noted within the minor fissure.  IMPRESSION: Improved aeration in the right base   Electronically Signed  By: Inez Catalina M.D.   On: 11/15/2013 07:37   Dg Chest 2 View  11/11/2013   CLINICAL DATA Squamous carcinoma of lung  EXAM CHEST  2 VIEW  COMPARISON CXR 10/30/2013, chest CT 09/19/2013  FINDINGS Right middle lobe volume loss unchanged from the CT. This could be due to endobronchial obstruction from a mass.  Calcified granuloma left lung base unchanged  Negative for heart failure or effusion.  Heart size is normal.  IMPRESSION Right middle lobe volume loss unchanged. Endobronchial obstructing lesion not excluded.  SIGNATURE  Electronically Signed   By: Franchot Gallo M.D.   On: 11/11/2013 16:15   Dg Chest Port 1 View  11/14/2013   CLINICAL DATA:  Right chest tube removed.  EXAM: PORTABLE CHEST - 1 VIEW  COMPARISON:  DG CHEST 1V PORT dated 11/14/2013; CT CHEST W/O CM dated 09/19/2013; DG CHEST 2 VIEW dated 09/19/2013  FINDINGS: The right chest tube has been removed. Question a tiny right apical pneumothorax. There is no evidence for a large pneumothorax. Stable density in the right mid lung. Again noted is volume loss at the right lung base. Central venous line in the lower SVC. Stable linear density in the left mid lung is consistent with scarring or atelectasis.  IMPRESSION: Removal of right chest tube and cannot exclude a tiny right apical pneumothorax. No evidence for a large pneumothorax.  Stable densities or volume loss in the right middle lobe region.   Electronically Signed   By: Markus Daft M.D.   On: 11/14/2013 10:58   Dg Chest Port 1 View  11/14/2013   CLINICAL DATA:  Right chest tube, followup portable exam 0618 hr compared to 11/13/2013  EXAM: PORTABLE CHEST - 1 VIEW  COMPARISON:  None.  FINDINGS: Right thoracostomy  tube stable.  Right jugular central venous catheter tip projecting over SVC near cavoatrial junction.  Enlargement of cardiac silhouette.  Mediastinal contours and pulmonary vascularity normal.  Bibasilar atelectasis greater on right.  No definite infiltrate, pleural effusion or pneumothorax.  IMPRESSION: Enlargement of cardiac silhouette.  Bibasilar atelectasis greater on right.  Little interval change.   Electronically Signed   By: Lavonia Dana M.D.   On: 11/14/2013 08:15   Dg Chest Portable 1 View  11/13/2013   CLINICAL DATA:  Postop resection of right middle lobe squamous cell carcinoma  EXAM: PORTABLE CHEST - 1 VIEW  COMPARISON:  Chest x-ray of 11/11/2013  FINDINGS: There is opacity at the right lung base postoperatively due to postop change and atelectasis. A right chest tube is present and no right pneumothorax is seen. Aeration of the left lung has diminished with areas of linear atelectasis. A right IJ stent venous line tip overlies the lower SVC. Heart size is normal on this poor inspiration film.  IMPRESSION: Postop opacity at the right lung base after resection of right middle lobe squamous cell carcinoma. Right chest tube is present with no pneumothorax.   Electronically Signed   By: Ivar Drape M.D.   On: 11/13/2013 15:23    ASSESSMENT AND PLAN: This is a very pleasant 73 years old white female with unresectable stage IIIa non-small cell lung cancer currently undergoing concurrent chemoradiation with weekly carboplatin and paclitaxel is status post 5 cycles. Overall she's tolerating her course of concurrent chemoradiation relatively well with the exception of difficulty swallowing and decreased by mouth intake both of food and fluids as a result. Patient was discussed with and also seen by Dr. Julien Nordmann. She will complete her  course of concurrent chemoradiation as scheduled. She'll followup in approximately 5-6 weeks another symptom management visit with a restaging CT scan of her chest with  contrast to reevaluate her disease. We will arrange to give her 1 L of normal saline in addition to her last scheduled weekly chemotherapy today. For the oral candidiasis a prescription for Diflucan was sent her pharmacy of record via E. scribed as well as a refill for her Carafate.   She was advised to call immediately if she has any concerning symptoms in the interval. The patient voices understanding of current disease status and treatment options and is in agreement with the current care plan.  All questions were answered. The patient knows to call the clinic with any problems, questions or concerns. We can certainly see the patient much sooner if necessary.  Carlton Adam, PA-C  ADDENDUM: Hematology/Oncology Attending: I had a face to face encounter with the patient today. I recommended her care plan. This is a very pleasant 73 years old white female recently diagnosed with unresectable stage IIIa non-small cell lung cancer and she is currently undergoing a course of concurrent chemoradiation with weekly carboplatin and paclitaxel status post 5 cycles. She will complete this course of treatment this week. She is tolerating her treatment fairly well with no significant adverse effects except for several areas of a skin lesion especially in the legs and in the sun exposed areas. I recommended for her to see a dermatologist for evaluation of these lesions. I recommended for the patient to come back for followup visit in 6 weeks with repeat CT scan of the chest for restaging of her disease. She was advised to call immediately if she has any concerning symptoms in the interval.  Disclaimer: This note was dictated with voice recognition software. Similar sounding words can inadvertently be transcribed and may not be corrected upon review. Curt Bears, MD 01/05/2014

## 2014-01-06 ENCOUNTER — Ambulatory Visit
Admission: RE | Admit: 2014-01-06 | Discharge: 2014-01-06 | Disposition: A | Discharge status: Home / Self Care | Payer: Medicare Other | Source: Ambulatory Visit | Attending: Radiation Oncology | Admitting: Radiation Oncology

## 2014-01-06 ENCOUNTER — Telehealth: Payer: Self-pay | Admitting: Oncology

## 2014-01-06 VITALS — BP 143/78 | HR 91 | Temp 98.2°F | Ht 60.0 in | Wt 159.0 lb

## 2014-01-06 DIAGNOSIS — C342 Malignant neoplasm of middle lobe, bronchus or lung: Secondary | ICD-10-CM

## 2014-01-06 MED ORDER — HYDROCODONE-ACETAMINOPHEN 7.5-325 MG/15ML PO SOLN: 10.0000 mL | Freq: Four times a day (QID) | ORAL | Status: DC | PRN

## 2014-01-06 NOTE — Telephone Encounter (Signed)
Kimberly [name] called back and verbalized her understanding to not take tussionex while taking hycet.

## 2014-01-06 NOTE — Telephone Encounter (Signed)
Called and left a message for Kimberly [name] to call back regarding not taking her tussionex and hycet at the same time.

## 2014-01-06 NOTE — Progress Notes (Signed)
  Radiation Oncology         [phone] ________________________________  Name: Kimberly [name] MRN: [id number]  Date: 01/06/2014  DOB: [birth year]  Weekly Radiation Therapy Management  Current Dose: 46.8 Gy     Planned Dose:  50.4 Gy  Narrative . . . . . . . . The patient presents for routine under treatment assessment.                                   The patient is without complaint except for significant pain with swallowing. This is interfering with her by mouth intake. She continues to take Carafate protonic and Zantac. She did receive IV fluid supplementation yesterday through medical oncology.                                 Set-up films were reviewed.                                 The chart was checked. Physical Findings. . .  height is 5' (1.524 m) and weight is 159 lb (72.122 kg). Her oral temperature is 98.2 F (36.8 C). Her blood pressure is 143/78 and her pulse is 91. Her oxygen saturation is 99%. . The lungs are clear. The heart has a regular rhythm and rate. The abdomen is soft and nontender with normal bowel sounds. Impression . . . . . . . The patient is tolerating radiation. Plan . . . . . . . . . . . . Continue treatment as planned.  She was given a prescription for hydrocodone elixir today  ________________________________   Blair Promise, PhD, MD

## 2014-01-06 NOTE — Progress Notes (Signed)
Kimberly [name] has had 26 fractions to her right chest. She reports a sore/burning in her throat and chest.  She is rating the pain at a 9/10.  She can only get down sips of water and some soft foods.  She is taking Carafate, protonix and zantac.  She reports that her cough is gone.  She denies shortness of breath.  She reports that she is nauseated a lot with occasional vomiting.  She is taking compazine.  She had her last chemotherapy yesterday.  She has redness on her right upper back.  She is using radiaplex.

## 2014-01-07 ENCOUNTER — Ambulatory Visit: Payer: Medicare Other

## 2014-01-07 ENCOUNTER — Ambulatory Visit
Admission: RE | Admit: 2014-01-07 | Discharge: 2014-01-07 | Disposition: A | Discharge status: Home / Self Care | Payer: Medicare Other | Source: Ambulatory Visit | Attending: Radiation Oncology | Admitting: Radiation Oncology

## 2014-01-08 ENCOUNTER — Telehealth: Payer: Self-pay | Admitting: Internal Medicine

## 2014-01-08 ENCOUNTER — Ambulatory Visit
Admission: RE | Admit: 2014-01-08 | Discharge: 2014-01-08 | Disposition: A | Discharge status: Home / Self Care | Payer: Medicare Other | Source: Ambulatory Visit | Attending: Radiation Oncology | Admitting: Radiation Oncology

## 2014-01-08 NOTE — Telephone Encounter (Signed)
S/W PT GAVE APPTS FOR LAB 6/11 @ 11AM, CT 6/12 @ 8AM, AND MD VISIT 6/15 @ 8.45AM. PT VERBALIZED UNDERSTANDING.

## 2014-01-10 ENCOUNTER — Observation Stay (HOSPITAL_COMMUNITY)
Admission: EM | Admit: 2014-01-10 | Discharge: 2014-01-12 | Disposition: A | Discharge status: Home / Self Care | Payer: Medicare Other | Attending: Internal Medicine | Admitting: Internal Medicine

## 2014-01-10 ENCOUNTER — Encounter (HOSPITAL_COMMUNITY): Payer: Self-pay | Admitting: Emergency Medicine

## 2014-01-10 DIAGNOSIS — E039 Hypothyroidism, unspecified: Secondary | ICD-10-CM | POA: Insufficient documentation

## 2014-01-10 DIAGNOSIS — K529 Noninfective gastroenteritis and colitis, unspecified: Secondary | ICD-10-CM

## 2014-01-10 DIAGNOSIS — Z79899 Other long term (current) drug therapy: Secondary | ICD-10-CM | POA: Insufficient documentation

## 2014-01-10 DIAGNOSIS — K5289 Other specified noninfective gastroenteritis and colitis: Secondary | ICD-10-CM | POA: Insufficient documentation

## 2014-01-10 DIAGNOSIS — J189 Pneumonia, unspecified organism: Secondary | ICD-10-CM

## 2014-01-10 DIAGNOSIS — D6481 Anemia due to antineoplastic chemotherapy: Secondary | ICD-10-CM | POA: Insufficient documentation

## 2014-01-10 DIAGNOSIS — R197 Diarrhea, unspecified: Secondary | ICD-10-CM | POA: Insufficient documentation

## 2014-01-10 DIAGNOSIS — J45909 Unspecified asthma, uncomplicated: Secondary | ICD-10-CM

## 2014-01-10 DIAGNOSIS — D696 Thrombocytopenia, unspecified: Secondary | ICD-10-CM | POA: Insufficient documentation

## 2014-01-10 DIAGNOSIS — R55 Syncope and collapse: Secondary | ICD-10-CM

## 2014-01-10 DIAGNOSIS — J9811 Atelectasis: Secondary | ICD-10-CM

## 2014-01-10 DIAGNOSIS — K625 Hemorrhage of anus and rectum: Secondary | ICD-10-CM

## 2014-01-10 DIAGNOSIS — D709 Neutropenia, unspecified: Secondary | ICD-10-CM | POA: Insufficient documentation

## 2014-01-10 DIAGNOSIS — J9809 Other diseases of bronchus, not elsewhere classified: Secondary | ICD-10-CM

## 2014-01-10 DIAGNOSIS — I10 Essential (primary) hypertension: Secondary | ICD-10-CM | POA: Insufficient documentation

## 2014-01-10 DIAGNOSIS — J309 Allergic rhinitis, unspecified: Secondary | ICD-10-CM

## 2014-01-10 DIAGNOSIS — Z96698 Presence of other orthopedic joint implants: Secondary | ICD-10-CM | POA: Insufficient documentation

## 2014-01-10 DIAGNOSIS — R042 Hemoptysis: Secondary | ICD-10-CM

## 2014-01-10 DIAGNOSIS — D72829 Elevated white blood cell count, unspecified: Secondary | ICD-10-CM

## 2014-01-10 DIAGNOSIS — I080 Rheumatic disorders of both mitral and aortic valves: Secondary | ICD-10-CM | POA: Insufficient documentation

## 2014-01-10 DIAGNOSIS — K121 Other forms of stomatitis: Principal | ICD-10-CM | POA: Insufficient documentation

## 2014-01-10 DIAGNOSIS — G453 Amaurosis fugax: Secondary | ICD-10-CM

## 2014-01-10 DIAGNOSIS — K219 Gastro-esophageal reflux disease without esophagitis: Secondary | ICD-10-CM | POA: Insufficient documentation

## 2014-01-10 DIAGNOSIS — D638 Anemia in other chronic diseases classified elsewhere: Secondary | ICD-10-CM | POA: Insufficient documentation

## 2014-01-10 DIAGNOSIS — C342 Malignant neoplasm of middle lobe, bronchus or lung: Secondary | ICD-10-CM | POA: Insufficient documentation

## 2014-01-10 DIAGNOSIS — E785 Hyperlipidemia, unspecified: Secondary | ICD-10-CM

## 2014-01-10 DIAGNOSIS — K1230 Oral mucositis (ulcerative), unspecified: Principal | ICD-10-CM

## 2014-01-10 DIAGNOSIS — D61818 Other pancytopenia: Secondary | ICD-10-CM | POA: Insufficient documentation

## 2014-01-10 DIAGNOSIS — K922 Gastrointestinal hemorrhage, unspecified: Secondary | ICD-10-CM | POA: Insufficient documentation

## 2014-01-10 DIAGNOSIS — T451X5A Adverse effect of antineoplastic and immunosuppressive drugs, initial encounter: Secondary | ICD-10-CM | POA: Insufficient documentation

## 2014-01-10 DIAGNOSIS — R0602 Shortness of breath: Secondary | ICD-10-CM

## 2014-01-10 DIAGNOSIS — E876 Hypokalemia: Secondary | ICD-10-CM | POA: Insufficient documentation

## 2014-01-10 LAB — CBC WITH DIFFERENTIAL/PLATELET
BASOS ABS: 0 10*3/uL (ref 0.0–0.1)
Basophils Relative: 3 % — ABNORMAL HIGH (ref 0–1)
Eosinophils Absolute: 0 10*3/uL (ref 0.0–0.7)
Eosinophils Relative: 3 % (ref 0–5)
HCT: 51.4 % — ABNORMAL HIGH (ref 36.0–46.0)
Hemoglobin: 18.6 g/dL — ABNORMAL HIGH (ref 12.0–15.0)
LYMPHS ABS: 0.1 10*3/uL — AB (ref 0.7–4.0)
LYMPHS PCT: 25 % (ref 12–46)
MCH: 31 pg (ref 26.0–34.0)
MCHC: 36.2 g/dL — ABNORMAL HIGH (ref 30.0–36.0)
MCV: 85.7 fL (ref 78.0–100.0)
Monocytes Absolute: 0 10*3/uL — ABNORMAL LOW (ref 0.1–1.0)
Monocytes Relative: 11 % (ref 3–12)
Neutro Abs: 0.3 10*3/uL — ABNORMAL LOW (ref 1.7–7.7)
Neutrophils Relative %: 58 % (ref 43–77)
PLATELETS: 47 10*3/uL — AB (ref 150–400)
RBC: 6 MIL/uL — ABNORMAL HIGH (ref 3.87–5.11)
RDW: 13.8 % (ref 11.5–15.5)
WBC: 0.4 10*3/uL — AB (ref 4.0–10.5)

## 2014-01-10 LAB — COMPREHENSIVE METABOLIC PANEL
ALBUMIN: 3.3 g/dL — AB (ref 3.5–5.2)
ALT: 18 U/L (ref 0–35)
AST: 22 U/L (ref 0–37)
Alkaline Phosphatase: 89 U/L (ref 39–117)
BUN: 13 mg/dL (ref 6–23)
CHLORIDE: 93 meq/L — AB (ref 96–112)
CO2: 26 mEq/L (ref 19–32)
CREATININE: 0.94 mg/dL (ref 0.50–1.10)
Calcium: 8 mg/dL — ABNORMAL LOW (ref 8.4–10.5)
GFR calc Af Amer: 70 mL/min — ABNORMAL LOW (ref >90)
GFR calc non Af Amer: 60 mL/min — ABNORMAL LOW (ref >90)
Glucose, Bld: 127 mg/dL — ABNORMAL HIGH (ref 70–99)
Potassium: 3 mEq/L — ABNORMAL LOW (ref 3.7–5.3)
Sodium: 136 mEq/L — ABNORMAL LOW (ref 137–147)
TOTAL PROTEIN: 6.3 g/dL (ref 6.0–8.3)
Total Bilirubin: 0.7 mg/dL (ref 0.3–1.2)

## 2014-01-10 LAB — ABO/RH: ABO/RH(D): O POS

## 2014-01-10 LAB — PROTIME-INR
INR: 1 (ref 0.00–1.49)
Prothrombin Time: 13 seconds (ref 11.6–15.2)

## 2014-01-10 MED ORDER — METRONIDAZOLE IN NACL 5-0.79 MG/ML-% IV SOLN
500.0000 mg | Freq: Once | INTRAVENOUS | Status: AC
  Administered 2014-01-10: 500 mg via INTRAVENOUS
  Filled 2014-01-10: qty 100

## 2014-01-10 MED ORDER — SODIUM CHLORIDE 0.9 % IV SOLN
Freq: Once | INTRAVENOUS | Status: AC
  Administered 2014-01-10: 21:00:00 via INTRAVENOUS

## 2014-01-10 MED ORDER — CIPROFLOXACIN IN D5W 400 MG/200ML IV SOLN
400.0000 mg | Freq: Once | INTRAVENOUS | Status: AC
  Administered 2014-01-10: 400 mg via INTRAVENOUS
  Filled 2014-01-10: qty 200

## 2014-01-10 NOTE — ED Notes (Signed)
MD at bedside. 

## 2014-01-10 NOTE — ED Provider Notes (Signed)
CSN: [id number]     Arrival date & time 01/10/14  1918 History   First MD Initiated Contact with Patient 01/10/14 1949     Chief Complaint  Patient presents with  . Rectal Bleeding      HPI  PCP is Dr. Lavonna Monarch at [facility].  Oncology is Dr. Jerilynn Mages. Mohommed.   Patient has history of lung cancer and is receiving radiation therapy and chemotherapy. Last radiation was Thursday. Last chemotherapy was Monday.  A bowel movement on Tuesday.  Last night about midnight started having loose stools with blood and mucus. These are persisted all night and all day. Greater than 10 episodes the last 12 hours.  No abdominal pain.  No fever.  Has not had difficulty with anemia, TCP, or neutropenia c tx.  History of colonoscopy  "a few years ago".  No abnormalities per pt.  Past Medical History  Diagnosis Date  . Hypertension   . Thyroid disease   . GERD (gastroesophageal reflux disease)   . Cancer   . Dyslipidemia   . Aortic insufficiency   . Mitral insufficiency   . History of nuclear stress test 02/26/2006    exercise myoview; normal pattern of perfusion; low risk scan   . PONV (postoperative nausea and vomiting)   . Heart murmur   . Hypothyroidism   . Cough     dry, endobronchial mass  . Pneumonia     pus bronchitis  . Arthritis   . Syncope     "states she has passed out a few times. dr is trying to find cause.last time when geeting ready to go home after video bronch/bx  . Shortness of breath     with exertion  . Anxiety     due to surgery   . Bronchitis    Past Surgical History  Procedure Laterality Date  . Thyroidectomy  1973  . Dilation and curettage of uterus    . Carpal tunnel release Right 1988  . Rotator cuff repair  2006    ? side  . H/o met test w/pft  04/02/2012    low risk; peak VO2 77% predicted  . Cardiac catheterization  07/08/2008    normal coronaries  . Transthoracic echocardiogram  09/25/2012    EF 55-60%; mild LVH & mild concentric hypertrophy; mild AV regurg;  RV systolic pressure increase consistent with mild pulm HTN  . Video bronchoscopy Bilateral 09/25/2013    Procedure: VIDEO BRONCHOSCOPY WITHOUT FLUORO;  Surgeon: Tanda Rockers, MD;  Location: [facility];  Service: Cardiopulmonary;  Laterality: Bilateral;  . Joint replacement  2003    thumb rt  . Knee arthroscopy  12    rt meniscus  . Video bronchoscopy with endobronchial ultrasound N/A 10/30/2013    Procedure: VIDEO BRONCHOSCOPY WITH ENDOBRONCHIAL ULTRASOUND;  Surgeon: Melrose Nakayama, MD;  Location: [place];  Service: Thoracic;  Laterality: N/A;  . Mediastinoscopy N/A 10/30/2013    Procedure: MEDIASTINOSCOPY;  Surgeon: Melrose Nakayama, MD;  Location: [place];  Service: Thoracic;  Laterality: N/A;  . Eye surgery Bilateral   . Video assisted thoracoscopy (vats)/ lobectomy Right 11/13/2013    Procedure: VIDEO ASSISTED THORACOSCOPY (VATS) with mediastinal  biopsies;  Surgeon: Melrose Nakayama, MD;  Location: [place];  Service: Thoracic;  Laterality: Right;  RIGHT VATS,mediastinal biopsies   Family History  Problem Relation Age of Onset  . Lung cancer Mother   . Heart attack Father   . Heart failure Maternal Grandfather   . Heart attack  Paternal Grandfather    History  Substance Use Topics  . Smoking status: Former Smoker -- 0.50 packs/day for 6 years    Types: Cigarettes    Quit date: 09/05/1967  . Smokeless tobacco: Never Used     Comment: occ wine  . Alcohol Use: Yes     Comment: occasional 1-2 drinks/month   OB History   Grav Para Term Preterm Abortions TAB SAB Ect Mult Living                 Review of Systems  Constitutional: Negative for fever, chills, diaphoresis, appetite change and fatigue.  HENT: Negative for mouth sores, sore throat and trouble swallowing.   Eyes: Negative for visual disturbance.  Respiratory: Negative for cough, chest tightness, shortness of breath and wheezing.   Cardiovascular: Negative for chest pain.  Gastrointestinal: Positive for  diarrhea, blood in stool and anal bleeding. Negative for nausea, vomiting, abdominal pain and abdominal distention.  Endocrine: Negative for polydipsia, polyphagia and polyuria.  Genitourinary: Negative for dysuria, frequency and hematuria.  Musculoskeletal: Negative for gait problem.  Skin: Negative for color change, pallor and rash.  Neurological: Negative for dizziness, syncope, light-headedness and headaches.  Hematological: Does not bruise/bleed easily.  Psychiatric/Behavioral: Negative for behavioral problems and confusion.      Allergies  Milk-related compounds  Home Medications   Prior to Admission medications   Medication Sig Start Date End Date Taking? Authorizing Provider  atorvastatin (LIPITOR) 20 MG tablet Take 20 mg by mouth daily.   Yes Historical Provider, MD  fluconazole (DIFLUCAN) 100 MG tablet Take 2 tablets by mouth on day one, then take 1 tablet by mouth daily until completed. Do not take yout Lipitor while taking the Diflucan 01/05/14  Yes Adrena E Johnson, PA-C  hyaluronate sodium (RADIAPLEXRX) GEL Apply 1 application topically 2 (two) times daily.   Yes Historical Provider, MD  levothyroxine (SYNTHROID, LEVOTHROID) 125 MCG tablet Take 125 mcg by mouth daily before breakfast.   Yes Historical Provider, MD  metoprolol succinate (TOPROL-XL) 25 MG 24 hr tablet Take 12.5 mg by mouth daily.   Yes Historical Provider, MD  montelukast (SINGULAIR) 10 MG tablet Take 10 mg by mouth daily. 04/15/13  Yes Historical Provider, MD  pantoprazole (PROTONIX) 40 MG tablet Take 40 mg by mouth 2 (two) times daily.    Yes Historical Provider, MD  prochlorperazine (COMPAZINE) 10 MG tablet Take 1 tablet (10 mg total) by mouth every 6 (six) hours as needed for nausea or vomiting. 12/01/13  Yes Curt Bears, MD  ranitidine (ZANTAC) 300 MG tablet Take 300 mg by mouth at bedtime.   Yes Historical Provider, MD  sucralfate (CARAFATE) 1 GM/10ML suspension Take 10 mLs (1 g total) by mouth 4 (four)  times daily -  with meals and at bedtime. 01/05/14  Yes Adrena E Johnson, PA-C  traMADol (ULTRAM) 50 MG tablet Take 25 mg by mouth every 6 (six) hours as needed for moderate pain.   Yes Historical Provider, MD  EPINEPHrine (EPIPEN IJ) Inject 1 each as directed once as needed (for reaction to milk products).    Historical Provider, MD  irbesartan-hydrochlorothiazide (AVALIDE) 300-12.5 MG per tablet Take 1 tablet by mouth daily.    Historical Provider, MD   BP 128/77  Pulse 97  Temp(Src) 98.2 F (36.8 C) (Oral)  Resp 15  SpO2 99% Physical Exam  Constitutional: She is oriented to person, place, and time. She appears well-developed and well-nourished. No distress.  HENT:  Head: Normocephalic.  Conjunctivas  not pale.  Eyes: Conjunctivae are normal. Pupils are equal, round, and reactive to light. No scleral icterus.  Neck: Normal range of motion. Neck supple. No thyromegaly present.  Cardiovascular: Normal rate and regular rhythm.  Exam reveals no gallop and no friction rub.   No murmur heard. Pulmonary/Chest: Effort normal and breath sounds normal. No respiratory distress. She has no wheezes. She has no rales.  Abdominal: Soft. Bowel sounds are normal. She exhibits no distension. There is no tenderness. There is no rebound.  No perirectal fissures, hemorrhoids.  Musculoskeletal: Normal range of motion.  Neurological: She is alert and oriented to person, place, and time.  Skin: Skin is warm and dry. No rash noted.  Psychiatric: She has a normal mood and affect. Her behavior is normal.    ED Course  Procedures (including critical care time) Labs Review Labs Reviewed  CBC WITH DIFFERENTIAL - Abnormal; Notable for the following:    WBC 0.4 (*)    RBC 6.00 (*)    Hemoglobin 18.6 (*)    HCT 51.4 (*)    MCHC 36.2 (*)    Platelets 47 (*)    Basophils Relative 3 (*)    Neutro Abs 0.3 (*)    Lymphs Abs 0.1 (*)    Monocytes Absolute 0.0 (*)    All other components within normal limits   COMPREHENSIVE METABOLIC PANEL - Abnormal; Notable for the following:    Sodium 136 (*)    Potassium 3.0 (*)    Chloride 93 (*)    Glucose, Bld 127 (*)    Calcium 8.0 (*)    Albumin 3.3 (*)    GFR calc non Af Amer 60 (*)    GFR calc Af Amer 70 (*)    All other components within normal limits  CULTURE, BLOOD (ROUTINE X 2)  CULTURE, BLOOD (ROUTINE X 2)  CLOSTRIDIUM DIFFICILE BY PCR  PROTIME-INR  GI PATHOGEN PANEL BY PCR, STOOL  TYPE AND SCREEN  ABO/RH    Imaging Review No results found.   EKG Interpretation None      MDM   Final diagnoses:  Rectal bleeding    I discussed the case with Dr.Kamaneni, on call for the patient's Oncologist, Dr. Aundra Dubin.  Will start Cipro, Flagyl.  Hospitalist consulted for admission.    Tanna Furry, MD 01/13/14 1946

## 2014-01-10 NOTE — H&P (Signed)
[facility] History and Physical  Patient: Kimberly [name]  YME:[id number]  DOB: 11-30-1940  DOS: the patient was seen and examined on 01/10/2014 PCP: [name], MD  Chief Complaint: Bloody diarrhea  HPI: Kimberly [name] is a 73 y.o. female with Past medical history of lung cancer, hypertension, hypothyroidism, GERD. The patient presented with complaints of diarrhea with blood. She mentions that since last night she started having increasing frequency of bowel motions which his lows and is bright red in color. She mentions with her chemotherapy she always gets constipation. Last Monday she had a chemotherapy and she was constipated on till Friday. She had Friday bowel movement which was initially black and later on she started having bright red to dark maroon bowel movements. She also mentions whenever she had an urge to possibly as she leaks bowels. She denies any similar episodes in the past. She denies any abdominal pain, fever, chills. She has esophageal irritation due to radiation which is unchanged. She is on liquid diet for last many months. She denies any nausea or vomiting she burning urination leg swelling or rash similar bleeding anywhere else headache focal neurological deficit chest pain cough. She had a colonoscopy a few years ago which was negative for any acute abnormality. She denies any acid reflux  The patient is coming from home. And at her baseline independent for most of her ADL.  Review of Systems: as mentioned in the history of present illness.  A Comprehensive review of the other systems is negative.  Past Medical History  Diagnosis Date  . Hypertension   . Thyroid disease   . GERD (gastroesophageal reflux disease)   . Cancer   . Dyslipidemia   . Aortic insufficiency   . Mitral insufficiency   . History of nuclear stress test 02/26/2006    exercise myoview; normal pattern of perfusion; low risk scan   . PONV (postoperative nausea and  vomiting)   . Heart murmur   . Hypothyroidism   . Cough     dry, endobronchial mass  . Pneumonia     pus bronchitis  . Arthritis   . Syncope     "states she has passed out a few times. dr is trying to find cause.last time when geeting ready to go home after video bronch/bx  . Shortness of breath     with exertion  . Anxiety     due to surgery   . Bronchitis    Past Surgical History  Procedure Laterality Date  . Thyroidectomy  1973  . Dilation and curettage of uterus    . Carpal tunnel release Right 1988  . Rotator cuff repair  2006    ? side  . H/o met test w/pft  04/02/2012    low risk; peak VO2 77% predicted  . Cardiac catheterization  07/08/2008    normal coronaries  . Transthoracic echocardiogram  09/25/2012    EF 55-60%; mild LVH & mild concentric hypertrophy; mild AV regurg; RV systolic pressure increase consistent with mild pulm HTN  . Video bronchoscopy Bilateral 09/25/2013    Procedure: VIDEO BRONCHOSCOPY WITHOUT FLUORO;  Surgeon: Tanda Rockers, MD;  Location: [facility];  Service: Cardiopulmonary;  Laterality: Bilateral;  . Joint replacement  2003    thumb rt  . Knee arthroscopy  12    rt meniscus  . Video bronchoscopy with endobronchial ultrasound N/A 10/30/2013    Procedure: VIDEO BRONCHOSCOPY WITH ENDOBRONCHIAL ULTRASOUND;  Surgeon: Melrose Nakayama, MD;  Location: [place];  Service: Thoracic;  Laterality: N/A;  . Mediastinoscopy N/A 10/30/2013    Procedure: MEDIASTINOSCOPY;  Surgeon: Melrose Nakayama, MD;  Location: [place];  Service: Thoracic;  Laterality: N/A;  . Eye surgery Bilateral   . Video assisted thoracoscopy (vats)/ lobectomy Right 11/13/2013    Procedure: VIDEO ASSISTED THORACOSCOPY (VATS) with mediastinal  biopsies;  Surgeon: Melrose Nakayama, MD;  Location: [place];  Service: Thoracic;  Laterality: Right;  RIGHT VATS,mediastinal biopsies   Social History:  reports that she quit smoking about 46 years ago. Her smoking use included Cigarettes. She  has a 3 pack-year smoking history. She has never used smokeless tobacco. She reports that she drinks alcohol. She reports that she does not use illicit drugs.  Allergies  Allergen Reactions  . Milk-Related Compounds Other (See Comments)    REACTION: GI upset, projectile vomiting    Family History  Problem Relation Age of Onset  . Lung cancer Mother   . Heart attack Father   . Heart failure Maternal Grandfather   . Heart attack Paternal Grandfather     Prior to Admission medications   Medication Sig Start Date End Date Taking? Authorizing Provider  atorvastatin (LIPITOR) 20 MG tablet Take 20 mg by mouth daily.   Yes Historical Provider, MD  fluconazole (DIFLUCAN) 100 MG tablet Take 2 tablets by mouth on day one, then take 1 tablet by mouth daily until completed. Do not take yout Lipitor while taking the Diflucan 01/05/14  Yes Adrena E Johnson, PA-C  hyaluronate sodium (RADIAPLEXRX) GEL Apply 1 application topically 2 (two) times daily.   Yes Historical Provider, MD  levothyroxine (SYNTHROID, LEVOTHROID) 125 MCG tablet Take 125 mcg by mouth daily before breakfast.   Yes Historical Provider, MD  metoprolol succinate (TOPROL-XL) 25 MG 24 hr tablet Take 12.5 mg by mouth daily.   Yes Historical Provider, MD  montelukast (SINGULAIR) 10 MG tablet Take 10 mg by mouth daily. 04/15/13  Yes Historical Provider, MD  pantoprazole (PROTONIX) 40 MG tablet Take 40 mg by mouth 2 (two) times daily.    Yes Historical Provider, MD  prochlorperazine (COMPAZINE) 10 MG tablet Take 1 tablet (10 mg total) by mouth every 6 (six) hours as needed for nausea or vomiting. 12/01/13  Yes Curt Bears, MD  ranitidine (ZANTAC) 300 MG tablet Take 300 mg by mouth at bedtime.   Yes Historical Provider, MD  sucralfate (CARAFATE) 1 GM/10ML suspension Take 10 mLs (1 g total) by mouth 4 (four) times daily -  with meals and at bedtime. 01/05/14  Yes Adrena E Johnson, PA-C  traMADol (ULTRAM) 50 MG tablet Take 25 mg by mouth every 6  (six) hours as needed for moderate pain.   Yes Historical Provider, MD  EPINEPHrine (EPIPEN IJ) Inject 1 each as directed once as needed (for reaction to milk products).    Historical Provider, MD  irbesartan-hydrochlorothiazide (AVALIDE) 300-12.5 MG per tablet Take 1 tablet by mouth daily.    Historical Provider, MD    Physical Exam: Filed Vitals:   01/10/14 1935 01/10/14 2234  BP: 123/76 128/77  Pulse: 117 97  Temp: 98.2 F (36.8 C)   TempSrc: Oral   Resp: 15   SpO2: 98% 99%    General: Alert, Awake and Oriented to Time, Place and Person. Appear in mild distress Eyes: PERRL ENT: Oral Mucosa clear moist. Neck: No JVD Cardiovascular: S1 and S2 Present, no Murmur, Peripheral Pulses Present Respiratory: Bilateral Air entry equal and Decreased, Clear to Auscultation,  Abdomen: Bowel Sound  Present, Soft andNon tender Skin:  diffuse maculopapular chronic Rash Extremities:  bilateral chronic Pedal edema,  no calf tenderness Neurologic: Grossly no focal neuro deficit.  Labs on Admission:  CBC:  Recent Labs Lab 01/05/14 0827 01/10/14 2030  WBC 2.3* 0.4*  NEUTROABS 1.9 0.3*  HGB 10.6* 18.6*  HCT 31.0* 51.4*  MCV 89.6 85.7  PLT 127* 47*    CMP     Component Value Date/Time   NA 136* 01/10/2014 2030   NA 137 01/05/2014 0827   K 3.0* 01/10/2014 2030   K 4.0 01/05/2014 0827   CL 93* 01/10/2014 2030   CO2 26 01/10/2014 2030   CO2 25 01/05/2014 0827   GLUCOSE 127* 01/10/2014 2030   GLUCOSE 120 01/05/2014 0827   BUN 13 01/10/2014 2030   BUN 15.9 01/05/2014 0827   CREATININE 0.94 01/10/2014 2030   CREATININE 1.0 01/05/2014 0827   CALCIUM 8.0* 01/10/2014 2030   CALCIUM 10.0 01/05/2014 0827   PROT 6.3 01/10/2014 2030   PROT 6.7 01/05/2014 0827   ALBUMIN 3.3* 01/10/2014 2030   ALBUMIN 3.5 01/05/2014 0827   AST 22 01/10/2014 2030   AST 18 01/05/2014 0827   ALT 18 01/10/2014 2030   ALT 16 01/05/2014 0827   ALKPHOS 89 01/10/2014 2030   ALKPHOS 86 01/05/2014 0827   BILITOT 0.7 01/10/2014 2030   BILITOT 0.73 01/05/2014  0827   GFRNONAA 60* 01/10/2014 2030   GFRAA 70* 01/10/2014 2030    No results found for this basename: LIPASE, AMYLASE,  in the last 168 hours No results found for this basename: AMMONIA,  in the last 168 hours  No results found for this basename: CKTOTAL, CKMB, CKMBINDEX, TROPONINI,  in the last 168 hours BNP (last 3 results) No results found for this basename: PROBNP,  in the last 8760 hours  Radiological Exams on Admission: No results found.   Assessment/Plan Principal Problem:   Bloody diarrhea Active Problems:   HYPERTENSION   Lung cancer, middle lobe   Colitis   1. Bloody diarrhea  the patient is presenting with complaints of bright red blood in her rectum. Her hemoglobin at present is 18, with neutropenia and thrombocytopenia it may be error, but she does not appear in any acute distress at present. She is hemodynamically stable does not have any fever does not have any abdominal tenderness on exam. With this she will be admitted to the hospital for monitoring I will obtain x-ray of her abdomen for further workup. She will be continued to be treated with IV ciprofloxacin and IV Flagyl. Probable etiology is mucositis secondary to chemotherapy also possibilities of C. difficile secondary to chemotherapy which is more common with paclitaxel regimen which the patient is on. Oncology has been consulted. If patient continues to have further bowel movements or becomes hemodynamically unstable gastroenterology should also be consulted. Gentle IV hydration, clear liquid diet GI pathogen and stool culture since he gets Continue Protonix, Pepcid  2.Hypothyroidism Continue Synthroid  3. Hypertension Continue Lopressor  4. Hypokalemia IV fluids with potassium and replace IV  Consults:  oncology  DVT Prophylaxis:mechanical compression device Nutrition:  clear liquid  Code Status:  full  Family Communication:  family was present at bedside, opportunity was given to ask question  and all questions were answered satisfactorily at the time of interview. Disposition: Admitted to inpatient in telemetry unit.  Author: Berle Mull, MD [facility] Pager: [phone] 01/10/2014, 11:47 PM    If 7PM-7AM, please contact night-coverage [email or web address] Password [email or web address]

## 2014-01-10 NOTE — ED Notes (Signed)
Patient is alert and oriented x3.  She states that she has lung cancer and her last Chemo was on Monday.   Patient states that she had not had a until Tuesday.  Tuesday she states That she started to have loose BM with blood.  The amount of blood is steady with mucous.   She denies any pain with this issue.  Currently she denies any pain but states that she is lethargic.

## 2014-01-11 ENCOUNTER — Inpatient Hospital Stay (HOSPITAL_COMMUNITY): Payer: Medicare Other

## 2014-01-11 ENCOUNTER — Encounter (HOSPITAL_COMMUNITY): Payer: Self-pay | Admitting: *Deleted

## 2014-01-11 DIAGNOSIS — K529 Noninfective gastroenteritis and colitis, unspecified: Secondary | ICD-10-CM | POA: Diagnosis present

## 2014-01-11 DIAGNOSIS — R197 Diarrhea, unspecified: Secondary | ICD-10-CM | POA: Diagnosis present

## 2014-01-11 DIAGNOSIS — D72829 Elevated white blood cell count, unspecified: Secondary | ICD-10-CM

## 2014-01-11 DIAGNOSIS — K625 Hemorrhage of anus and rectum: Secondary | ICD-10-CM

## 2014-01-11 LAB — CBC WITH DIFFERENTIAL/PLATELET
Basophils Absolute: 0 10*3/uL (ref 0.0–0.1)
Basophils Relative: 1 % (ref 0–1)
EOS PCT: 1 % (ref 0–5)
Eosinophils Absolute: 0 10*3/uL (ref 0.0–0.7)
HCT: 24.5 % — ABNORMAL LOW (ref 36.0–46.0)
HEMOGLOBIN: 8.5 g/dL — AB (ref 12.0–15.0)
LYMPHS PCT: 23 % (ref 12–46)
Lymphs Abs: 0.2 10*3/uL — ABNORMAL LOW (ref 0.7–4.0)
MCH: 30.1 pg (ref 26.0–34.0)
MCHC: 34.7 g/dL (ref 30.0–36.0)
MCV: 86.9 fL (ref 78.0–100.0)
MONO ABS: 0.1 10*3/uL (ref 0.1–1.0)
Monocytes Relative: 9 % (ref 3–12)
NEUTROS PCT: 66 % (ref 43–77)
Neutro Abs: 0.5 10*3/uL — ABNORMAL LOW (ref 1.7–7.7)
PLATELETS: 107 10*3/uL — AB (ref 150–400)
RBC: 2.82 MIL/uL — ABNORMAL LOW (ref 3.87–5.11)
RDW: 13.6 % (ref 11.5–15.5)
WBC: 0.8 10*3/uL — AB (ref 4.0–10.5)

## 2014-01-11 LAB — COMPREHENSIVE METABOLIC PANEL
ALK PHOS: 76 U/L (ref 39–117)
ALT: 15 U/L (ref 0–35)
AST: 18 U/L (ref 0–37)
Albumin: 2.7 g/dL — ABNORMAL LOW (ref 3.5–5.2)
BUN: 10 mg/dL (ref 6–23)
CALCIUM: 7.3 mg/dL — AB (ref 8.4–10.5)
CO2: 27 mEq/L (ref 19–32)
Chloride: 98 mEq/L (ref 96–112)
Creatinine, Ser: 0.88 mg/dL (ref 0.50–1.10)
GFR calc Af Amer: 75 mL/min — ABNORMAL LOW (ref >90)
GFR calc non Af Amer: 65 mL/min — ABNORMAL LOW (ref >90)
Glucose, Bld: 87 mg/dL (ref 70–99)
POTASSIUM: 2.8 meq/L — AB (ref 3.7–5.3)
Sodium: 139 mEq/L (ref 137–147)
TOTAL PROTEIN: 5.5 g/dL — AB (ref 6.0–8.3)
Total Bilirubin: 0.5 mg/dL (ref 0.3–1.2)

## 2014-01-11 LAB — POTASSIUM: Potassium: 3.2 mEq/L — ABNORMAL LOW (ref 3.7–5.3)

## 2014-01-11 LAB — CLOSTRIDIUM DIFFICILE BY PCR: CDIFFPCR: NEGATIVE

## 2014-01-11 MED ORDER — LEVOTHYROXINE SODIUM 125 MCG PO TABS
125.0000 ug | ORAL_TABLET | Freq: Every day | ORAL | Status: DC
  Administered 2014-01-11 – 2014-01-12 (×2): 125 ug via ORAL
  Filled 2014-01-11 (×3): qty 1

## 2014-01-11 MED ORDER — PANTOPRAZOLE SODIUM 40 MG PO TBEC
40.0000 mg | DELAYED_RELEASE_TABLET | Freq: Two times a day (BID) | ORAL | Status: DC
  Administered 2014-01-11 – 2014-01-12 (×3): 40 mg via ORAL
  Filled 2014-01-11 (×6): qty 1

## 2014-01-11 MED ORDER — ONDANSETRON HCL 4 MG PO TABS: 4.0000 mg | ORAL_TABLET | Freq: Four times a day (QID) | ORAL | Status: DC | PRN

## 2014-01-11 MED ORDER — PROCHLORPERAZINE MALEATE 10 MG PO TABS
10.0000 mg | ORAL_TABLET | Freq: Four times a day (QID) | ORAL | Status: DC | PRN
  Filled 2014-01-11: qty 1

## 2014-01-11 MED ORDER — CIPROFLOXACIN IN D5W 400 MG/200ML IV SOLN
400.0000 mg | Freq: Two times a day (BID) | INTRAVENOUS | Status: DC
  Administered 2014-01-11 – 2014-01-12 (×2): 400 mg via INTRAVENOUS
  Filled 2014-01-11 (×4): qty 200

## 2014-01-11 MED ORDER — POTASSIUM CHLORIDE IN NACL 20-0.9 MEQ/L-% IV SOLN
INTRAVENOUS | Status: DC
  Administered 2014-01-11: 75 mL/h via INTRAVENOUS
  Administered 2014-01-11 – 2014-01-12 (×2): via INTRAVENOUS
  Filled 2014-01-11 (×4): qty 1000

## 2014-01-11 MED ORDER — ACETAMINOPHEN 325 MG PO TABS: 650.0000 mg | ORAL_TABLET | Freq: Four times a day (QID) | ORAL | Status: DC | PRN

## 2014-01-11 MED ORDER — SUCRALFATE 1 GM/10ML PO SUSP
1.0000 g | Freq: Three times a day (TID) | ORAL | Status: DC
  Administered 2014-01-11 – 2014-01-12 (×6): 1 g via ORAL
  Filled 2014-01-11 (×9): qty 10

## 2014-01-11 MED ORDER — FAMOTIDINE 20 MG PO TABS
20.0000 mg | ORAL_TABLET | Freq: Two times a day (BID) | ORAL | Status: DC
  Administered 2014-01-11 – 2014-01-12 (×4): 20 mg via ORAL
  Filled 2014-01-11 (×5): qty 1

## 2014-01-11 MED ORDER — ACETAMINOPHEN 650 MG RE SUPP: 650.0000 mg | Freq: Four times a day (QID) | RECTAL | Status: DC | PRN

## 2014-01-11 MED ORDER — ONDANSETRON 8 MG/NS 50 ML IVPB
8.0000 mg | Freq: Four times a day (QID) | INTRAVENOUS | Status: DC | PRN
Start: 2014-01-11 — End: 2014-01-12
  Filled 2014-01-11: qty 8

## 2014-01-11 MED ORDER — MORPHINE SULFATE 2 MG/ML IJ SOLN: 2.0000 mg | INTRAMUSCULAR | Status: DC | PRN

## 2014-01-11 MED ORDER — METOPROLOL SUCCINATE 12.5 MG HALF TABLET
12.5000 mg | ORAL_TABLET | Freq: Every day | ORAL | Status: DC
  Administered 2014-01-11 – 2014-01-12 (×2): 12.5 mg via ORAL
  Filled 2014-01-11 (×2): qty 1

## 2014-01-11 MED ORDER — SODIUM CHLORIDE 0.9 % IJ SOLN
3.0000 mL | Freq: Two times a day (BID) | INTRAMUSCULAR | Status: DC
  Administered 2014-01-11 – 2014-01-12 (×4): 3 mL via INTRAVENOUS

## 2014-01-11 MED ORDER — PANTOPRAZOLE SODIUM 40 MG IV SOLR
40.0000 mg | Freq: Once | INTRAVENOUS | Status: AC
  Administered 2014-01-11: 40 mg via INTRAVENOUS
  Filled 2014-01-11: qty 40

## 2014-01-11 MED ORDER — METRONIDAZOLE IN NACL 5-0.79 MG/ML-% IV SOLN
500.0000 mg | Freq: Three times a day (TID) | INTRAVENOUS | Status: DC
  Administered 2014-01-11 – 2014-01-12 (×4): 500 mg via INTRAVENOUS
  Filled 2014-01-11 (×6): qty 100

## 2014-01-11 MED ORDER — POTASSIUM CHLORIDE CRYS ER 20 MEQ PO TBCR
40.0000 meq | EXTENDED_RELEASE_TABLET | Freq: Once | ORAL | Status: AC
  Administered 2014-01-11: 40 meq via ORAL
  Filled 2014-01-11: qty 2

## 2014-01-11 MED ORDER — POTASSIUM CHLORIDE 10 MEQ/100ML IV SOLN
10.0000 meq | INTRAVENOUS | Status: AC
  Administered 2014-01-11 (×3): 10 meq via INTRAVENOUS
  Filled 2014-01-11 (×3): qty 100

## 2014-01-11 NOTE — Progress Notes (Addendum)
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 -on chemo per Dr.[name] -notify Dr.[name] via epic  Hypothyroidism  Continue Synthroid   Hypertension  Continue Lopressor   Hypokalemia  -replace PO and IV   DVT proph: SCDs  Code Status: Full Code Family Communication:none at bedside Disposition Plan: home when stable   Consultants:  Onc via Epic  Antibiotics:  Cipro/Flagyl  HPI/Subjective: Feels ok, no abd pain, few episodes of BMs with mucus and trace blood yesterday and 1 episode this am  Objective: Filed Vitals:   2014-01-15 0500  BP: 121/70  Pulse: 90  Temp: 98.3 F (36.8 C)  Resp: 18    Intake/Output Summary (Last 24 hours) at 2014-01-15 0939 Last data filed at 15-Jan-2014 0700  Gross per 24 hour  Intake 733.33 ml  Output      0 ml  Net 733.33 ml   Filed Weights   15-Jan-2014 0141 2014-01-15 0500  Weight: 69.673 kg (153 lb 9.6 oz) 69.627 kg (153 lb 8 oz)    Exam:   General:  AAOx3, no distress  Cardiovascular: S1S2/RRR  Respiratory: CTAB  Abdomen: soft, Nt, BS present  Musculoskeletal: no edema c/c   Data Reviewed: Basic Metabolic Panel:  Recent Labs Lab 01/05/14 0827 01/10/14 2030 01/15/14 0332 01/15/2014 0815  NA 137 136* 139  --   K 4.0 3.0* 2.8* 3.2*  CL  --  93* 98  --   CO2 25 26 27   --   GLUCOSE 120 127* 87  --   BUN 15.9 13 10   --   CREATININE 1.0 0.94 0.88  --   CALCIUM 10.0 8.0* 7.3*  --    Liver Function Tests:  Recent Labs Lab 01/05/14 0827 01/10/14 2030 01-15-2014 0332  AST 18 22 18   ALT 16 18 15   ALKPHOS 86 89 76  BILITOT 0.73 0.7 0.5  PROT 6.7 6.3  5.5*  ALBUMIN 3.5 3.3* 2.7*   No results found for this basename: LIPASE, AMYLASE,  in the last 168 hours No results found for this basename: AMMONIA,  in the last 168 hours CBC:  Recent Labs Lab 01/05/14 0827 01/10/14 2030 01-15-2014 0332  WBC 2.3* 0.4* 0.8*  NEUTROABS 1.9 0.3* 0.5*  HGB 10.6* 18.6* 8.5*  HCT 31.0* 51.4* 24.5*  MCV 89.6 85.7 86.9  PLT 127* 47* 107*   Cardiac Enzymes: No results found for this basename: CKTOTAL, CKMB, CKMBINDEX, TROPONINI,  in the last 168 hours BNP (last 3 results) No results found for this basename: PROBNP,  in the last 8760 hours CBG: No results found for this basename: GLUCAP,  in the last 168 hours  No results found for this or any previous visit (from the past 240 hour(s)).   Studies: Acute Abdominal Series  Jan 15, 2014   CLINICAL DATA:  Bloody diarrhea.  Chemotherapy for lung cancer  EXAM: ACUTE ABDOMEN SERIES (ABDOMEN 2 VIEW & CHEST 1 VIEW)  COMPARISON:  12/02/2013 chest x-ray  FINDINGS: Normal heart size. Chronic right middle lobe collapse, postobstructive. Stable 8 mm nodule in the left lower lobe. There is no edema, consolidation, effusion, or pneumothorax. The surgical clips at the left thoracic inlet, likely thyroidal.  Nonobstructive bowel gas  pattern. No suspicious intra-abdominal mass effect or calcification. No acute osseous findings.  IMPRESSION: 1. Negative abdominal radiographs. 2. Chronic postobstructive right middle lobe collapse. No acute intrathoracic findings.   Electronically Signed   By: Jorje Guild M.D.   On: 01/11/2014 01:49    Scheduled Meds: . ciprofloxacin  400 mg Intravenous Q12H  . famotidine  20 mg Oral BID  . levothyroxine  125 mcg Oral QAC breakfast  . metoprolol succinate  12.5 mg Oral Daily  . metronidazole  500 mg Intravenous Q8H  . pantoprazole  40 mg Oral BID AC  . potassium chloride  40 mEq Oral Once  . sodium chloride  3 mL Intravenous Q12H  . sucralfate  1 g Oral TID WC & HS   Continuous  Infusions: . 0.9 % NaCl with KCl 20 mEq / L 100 mL/hr at 01/11/14 0240   Antibiotics Given (last 72 hours)   Date/Time Action Medication Dose Rate   01/11/14 4580 Given   metroNIDAZOLE (FLAGYL) IVPB 500 mg 500 mg 100 mL/hr      Principal Problem:   Bloody diarrhea Active Problems:   HYPERTENSION   Lung cancer, middle lobe   Colitis    Time spent: 85min    Kimberly [name]  [facility] Pager [phone]. If 7PM-7AM, please contact night-coverage at [email or web address], password [facility] 01/11/2014, 9:39 AM  LOS: 1 day

## 2014-01-11 NOTE — Progress Notes (Signed)
CRITICAL VALUE ALERT  Critical value received:  Potassium 2.8  Date of notification:  01/11/14  Time of notification:  0515  Critical value read back:yes  Nurse who received alert:  Drusilla Kanner, RN  MD notified (1st page):  Fredirick Maudlin, NP  Time of first page:  0530  MD notified (2nd page):  Time of second page:  Responding MD:  Fredirick Maudlin, NP  Time MD responded:  [phone]

## 2014-01-12 ENCOUNTER — Ambulatory Visit: Payer: Medicare Other

## 2014-01-12 ENCOUNTER — Other Ambulatory Visit: Payer: Medicare Other

## 2014-01-12 DIAGNOSIS — D6481 Anemia due to antineoplastic chemotherapy: Secondary | ICD-10-CM

## 2014-01-12 DIAGNOSIS — D6959 Other secondary thrombocytopenia: Secondary | ICD-10-CM

## 2014-01-12 DIAGNOSIS — D630 Anemia in neoplastic disease: Secondary | ICD-10-CM

## 2014-01-12 DIAGNOSIS — K922 Gastrointestinal hemorrhage, unspecified: Secondary | ICD-10-CM

## 2014-01-12 DIAGNOSIS — C342 Malignant neoplasm of middle lobe, bronchus or lung: Secondary | ICD-10-CM

## 2014-01-12 DIAGNOSIS — T451X5A Adverse effect of antineoplastic and immunosuppressive drugs, initial encounter: Secondary | ICD-10-CM

## 2014-01-12 DIAGNOSIS — D702 Other drug-induced agranulocytosis: Secondary | ICD-10-CM

## 2014-01-12 DIAGNOSIS — R197 Diarrhea, unspecified: Secondary | ICD-10-CM

## 2014-01-12 LAB — BASIC METABOLIC PANEL
BUN: 7 mg/dL (ref 6–23)
CALCIUM: 7.3 mg/dL — AB (ref 8.4–10.5)
CO2: 22 mEq/L (ref 19–32)
CREATININE: 0.93 mg/dL (ref 0.50–1.10)
Chloride: 101 mEq/L (ref 96–112)
GFR calc Af Amer: 71 mL/min — ABNORMAL LOW (ref >90)
GFR calc non Af Amer: 61 mL/min — ABNORMAL LOW (ref >90)
GLUCOSE: 72 mg/dL (ref 70–99)
Potassium: 3.3 mEq/L — ABNORMAL LOW (ref 3.7–5.3)
Sodium: 137 mEq/L (ref 137–147)

## 2014-01-12 LAB — CBC
HCT: 21.7 % — ABNORMAL LOW (ref 36.0–46.0)
HEMOGLOBIN: 7.5 g/dL — AB (ref 12.0–15.0)
MCH: 30.1 pg (ref 26.0–34.0)
MCHC: 34.6 g/dL (ref 30.0–36.0)
MCV: 87.1 fL (ref 78.0–100.0)
Platelets: 110 10*3/uL — ABNORMAL LOW (ref 150–400)
RBC: 2.49 MIL/uL — ABNORMAL LOW (ref 3.87–5.11)
RDW: 13.8 % (ref 11.5–15.5)
WBC: 0.9 10*3/uL — CL (ref 4.0–10.5)

## 2014-01-12 LAB — GI PATHOGEN PANEL BY PCR, STOOL
C DIFFICILE TOXIN A/B: NEGATIVE
CAMPYLOBACTER BY PCR: NEGATIVE
CRYPTOSPORIDIUM BY PCR: NEGATIVE
E COLI (STEC): NEGATIVE
E COLI 0157 BY PCR: NEGATIVE
E coli (ETEC) LT/ST: NEGATIVE
G lamblia by PCR: NEGATIVE
Norovirus GI/GII: NEGATIVE
Rotavirus A by PCR: NEGATIVE
Salmonella by PCR: NEGATIVE
Shigella by PCR: NEGATIVE

## 2014-01-12 LAB — PREPARE RBC (CROSSMATCH)

## 2014-01-12 MED ORDER — METRONIDAZOLE 500 MG PO TABS: 500.0000 mg | ORAL_TABLET | Freq: Three times a day (TID) | ORAL | Status: DC

## 2014-01-12 MED ORDER — CIPROFLOXACIN HCL 250 MG PO TABS
250.0000 mg | ORAL_TABLET | Freq: Two times a day (BID) | ORAL | Status: DC
  Administered 2014-01-12: 250 mg via ORAL
  Filled 2014-01-12 (×3): qty 1

## 2014-01-12 MED ORDER — POTASSIUM CHLORIDE CRYS ER 20 MEQ PO TBCR
40.0000 meq | EXTENDED_RELEASE_TABLET | Freq: Once | ORAL | Status: AC
  Administered 2014-01-12: 40 meq via ORAL
  Filled 2014-01-12: qty 2

## 2014-01-12 MED ORDER — BOOST / RESOURCE BREEZE PO LIQD: 1.0000 | Freq: Three times a day (TID) | ORAL | Status: DC

## 2014-01-12 MED ORDER — CIPROFLOXACIN HCL 250 MG PO TABS: 250.0000 mg | ORAL_TABLET | Freq: Two times a day (BID) | ORAL | Status: DC

## 2014-01-12 NOTE — Consult Note (Signed)
[place]  [phone]   Requesting Provider: [facility]  Consulting Provider: Dr. Julien Nordmann  Primary Oncologist: Dr. Roney Marion CONSULTATION  NOTE  Reason for Consultation: Lung Cancer                                              Pancytopenia  HPI: Kimberly [name] is a pleasant 73 year old woman with a history of lung cancer as described below, admitted on 01/10/14 with bloody diarrhea for about 12 hrs prior, following a period of constipation for which she took laxatives. She reported no abdominal pain, fever, or chills. 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 She did have mucositis.No respiratory or cardiac complaints. She had no mental status changes. She was noted to have pancytopenia as well, She was placed on enteric precautions.She received supportive therapy with IVF, IV Cipro and Flagyl. Abdominal X ray was negative. During her hospitalization her symptoms have improved, and her diarrhea is almost resolved. Her counts are not worse, but continues to be checked. She is to receive transfusion of 1 unit of PRBCs today. Platelets and WBC returning to baseline.   Oncological History:  DIAGNOSIS: Unresectable, likely a stage IIIA (T2a, N2, M0) non-small cell lung cancer, squamous cell carcinoma diagnosed in February 2015.   PRIOR THERAPY: On 11/13/2013 the patient underwent exploratory right VATS with mediastinal biopsy under the care of Dr. Roxan Hockey.   CURRENT THERAPY: Concurrent chemoradiation with weekly carboplatin for AUC of 2 and paclitaxel 45 mg/M2, status post 6cycles. First cycle was given on 12/01/2013. She was due for Cycle 7 on 01/12/14. Last radiation treatment (completion) on 01/08/14    Past Medical History  Diagnosis Date  . Hypertension   . Thyroid disease   . GERD (gastroesophageal reflux disease)   . Cancer   . Dyslipidemia   . Aortic insufficiency   . Mitral insufficiency     . History of nuclear stress test 02/26/2006    exercise myoview; normal pattern of perfusion; low risk scan   . PONV (postoperative nausea and vomiting)   . Heart murmur   . Hypothyroidism   . Cough     dry, endobronchial mass  . Pneumonia     pus bronchitis  . Arthritis   . Syncope     "states she has passed out a few times. dr is trying to find cause.last time when geeting ready to go home after video bronch/bx  . Shortness of breath     with exertion  . Anxiety     due to surgery   . Bronchitis      MEDICATIONS:  Scheduled Meds: . ciprofloxacin  400 mg Intravenous Q12H  . famotidine  20 mg Oral BID  . levothyroxine  125 mcg Oral QAC breakfast  . metoprolol succinate  12.5 mg Oral Daily  . metronidazole  500 mg Intravenous Q8H  . pantoprazole  40 mg Oral BID AC  . potassium chloride  40 mEq Oral Once  . sodium chloride  3 mL Intravenous Q12H  . sucralfate  1 g Oral TID WC & HS   Continuous Infusions: . 0.9 % NaCl with KCl 20 mEq / L 75 mL/hr at 01/12/14 0613   PRN Meds:.acetaminophen, acetaminophen, morphine injection, ondansetron (ZOFRAN) IV, ondansetron, prochlorperazine  ALLERGIES:  Allergies  Allergen Reactions  .  Milk-Related Compounds Other (See Comments)    REACTION: GI upset, projectile vomiting    Family History  Problem Relation Age of Onset  . Lung cancer Mother   . Heart attack Father   . Heart failure Maternal Grandfather   . Heart attack Paternal Grandfather      Past Surgical History  Procedure Laterality Date  . Thyroidectomy  1973  . Dilation and curettage of uterus    . Carpal tunnel release Right 1988  . Rotator cuff repair  2006    ? side  . H/o met test w/pft  04/02/2012    low risk; peak VO2 77% predicted  . Cardiac catheterization  07/08/2008    normal coronaries  . Transthoracic echocardiogram  09/25/2012    EF 55-60%; mild LVH & mild concentric hypertrophy; mild AV regurg; RV systolic pressure increase consistent with mild pulm  HTN  . Video bronchoscopy Bilateral 09/25/2013    Procedure: VIDEO BRONCHOSCOPY WITHOUT FLUORO;  Surgeon: Tanda Rockers, MD;  Location: [facility];  Service: Cardiopulmonary;  Laterality: Bilateral;  . Joint replacement  2003    thumb rt  . Knee arthroscopy  12    rt meniscus  . Video bronchoscopy with endobronchial ultrasound N/A 10/30/2013    Procedure: VIDEO BRONCHOSCOPY WITH ENDOBRONCHIAL ULTRASOUND;  Surgeon: Melrose Nakayama, MD;  Location: [facility];  Service: Thoracic;  Laterality: N/A;  . Mediastinoscopy N/A 10/30/2013    Procedure: MEDIASTINOSCOPY;  Surgeon: Melrose Nakayama, MD;  Location: [facility];  Service: Thoracic;  Laterality: N/A;  . Eye surgery Bilateral   . Video assisted thoracoscopy (vats)/ lobectomy Right 11/13/2013    Procedure: VIDEO ASSISTED THORACOSCOPY (VATS) with mediastinal  biopsies;  Surgeon: Melrose Nakayama, MD;  Location: [facility];  Service: Thoracic;  Laterality: Right;  RIGHT VATS,mediastinal biopsies    History   Social History  . Marital Status: Divorced    Spouse Name: N/A    Number of Children: 2  . Years of Education: N/A   Occupational History  . Not on file.   Social History Main Topics  . Smoking status: Former Smoker -- 0.50 packs/day for 6 years    Types: Cigarettes    Quit date: 09/05/1967  . Smokeless tobacco: Never Used     Comment: occ wine  . Alcohol Use: Yes     Comment: occasional 1-2 drinks/month  . Drug Use: No  . Sexual Activity: Not Currently   Other Topics Concern  . Not on file   Social History Narrative  . No narrative on file   ROS: see HPI all other ROS negative  PHYSICAL EXAMINATION:   Filed Vitals:   01/12/14 1030  BP: 158/65  Pulse: 83  Temp: 98 F (36.7 C)  Resp: 14   Filed Weights   01/11/14 0141 01/11/14 0500 01/12/14 0512  Weight: 153 lb 9.6 oz (69.673 kg) 153 lb 8 oz (69.627 kg) 156 lb 8 oz (70.98 kg)    73 year old in no acute distress,conversant, alert and oriented to time, place and  date.  General well-developed and well-nourished  HEENT: Normocephalic, atraumatic.Sclera anicteric.Oral cavity without thrush or lesions. Alopecia Neck:  Supple. No thyromegaly,no cervical or supraclavicular adenopathy  Lungs: Clear to auscultation. No wheezing, rhonchi or rales. Cardiac: Regular rate and rhythm, no murmur,rubs or gallops Abdomen: Soft nontender,bowel sounds x4. Nohepatosplenomegaly Extremities: No clubbing cyanosis or edema. No petechial rash Neuro: No focal or motor deficits  LABORATORY/RADIOLOGY DATA:   Recent Labs Lab 01/10/14 2030 01/11/14 6387  01/12/14 0417  WBC 0.4* 0.8* 0.9*  HGB 18.6* 8.5* 7.5*  HCT 51.4* 24.5* 21.7*  PLT 47* 107* 110*  MCV 85.7 86.9 87.1  MCH 31.0 30.1 30.1  MCHC 36.2* 34.7 34.6  RDW 13.8 13.6 13.8  LYMPHSABS 0.1* 0.2*  --   MONOABS 0.0* 0.1  --   EOSABS 0.0 0.0  --   BASOSABS 0.0 0.0  --     CMP    Recent Labs Lab 01/10/14 2030 01/11/14 0332 01/11/14 0815 01/12/14 0417  NA 136* 139  --  137  K 3.0* 2.8* 3.2* 3.3*  CL 93* 98  --  101  CO2 26 27  --  22  GLUCOSE 127* 87  --  72  BUN 13 10  --  7  CREATININE 0.94 0.88  --  0.93  CALCIUM 8.0* 7.3*  --  7.3*  AST 22 18  --   --   ALT 18 15  --   --   ALKPHOS 89 76  --   --   BILITOT 0.7 0.5  --   --         Component Value Date/Time   BILITOT 0.5 01/11/2014 0332   BILITOT 0.73 01/05/2014 0827   BILIDIR <0.2 09/19/2013 1058   IBILI NOT CALCULATED 09/19/2013 1058     Recent Labs Lab 01/10/14 2030  INR 1.00     Liver Function Tests:  Recent Labs Lab 01/10/14 2030 01/11/14 0332  AST 22 18  ALT 18 15  ALKPHOS 89 76  BILITOT 0.7 0.5  PROT 6.3 5.5*  ALBUMIN 3.3* 2.7*    Radiology Studies:  Acute Abdominal Series  01/11/2014   CLINICAL DATA:  Bloody diarrhea.  Chemotherapy for lung cancer  EXAM: ACUTE ABDOMEN SERIES (ABDOMEN 2 VIEW & CHEST 1 VIEW)  COMPARISON:  12/02/2013 chest x-ray  FINDINGS: Normal heart size. Chronic right middle lobe collapse,  postobstructive. Stable 8 mm nodule in the left lower lobe. There is no edema, consolidation, effusion, or pneumothorax. The surgical clips at the left thoracic inlet, likely thyroidal.  Nonobstructive bowel gas pattern. No suspicious intra-abdominal mass effect or calcification. No acute osseous findings.  IMPRESSION: 1. Negative abdominal radiographs. 2. Chronic postobstructive right middle lobe collapse. No acute intrathoracic findings.   Electronically Signed   By: Jorje Guild M.D.   On: 01/11/2014 01:49       ASSESSMENT AND PLAN:  Stage IIIA NSC Lung CA  She is on concurrent chemoradiation with weekly carboplatin and paclitaxel  status post  6cycles. First cycle was given on 12/01/2013. She was due for Cycle 7 on 01/12/14 which will be rescheduled. Last radiation treatment (completion) on 01/08/14.  Follow up at office on 02/16/14 at 845 am, with labs on 6/11 and CT of the chest on 6/12 prior to her visit  Mucositis/GI bleed  This is improving, in the setting of recent chemotherapy. She is on  empiric Cipro/flagyl, to continue for 5 more days. Continue supportive therapy.  -GI pathogen panel pending   Anemia  -due to chronic disease and chemotherapy. She is to receive transfusion of 1 unit of blood today.   Thrombocytopenia and Neutropenia Secondary to recent chemo and acute illness, returning to baseline.    Other medical issues as per admitting team    **Disclaimer: This note was dictated with voice recognition software. Similar sounding words can inadvertently be transcribed and this note may contain transcription errors which may not have been corrected upon publication of note.**  Rondel Jumbo, PA-C 01/12/2014, 10:50 AM  Addendum: Hematology/Oncology Attending: The patient is seen and examined. I agree with the above note. This is a very pleasant 73 years old white female diagnosed with unresectable a stage IIIa non-small cell lung cancer status post 6 cycles of  concurrent chemoradiation with weekly carboplatin and paclitaxel. She tolerated her previous course of concurrent chemoradiation fairly well. The patient was recently admitted with bloody diarrhea after a period of constipation and she had to take laxatives. PCR test for C. difficile was negative. The patient was started on treatment with IV Cipro and Flagyl and felt much better. She was also found to have pancytopenia during her admission but this is most likely secondary to her recent chemotherapy. She received 1 unit of PRBCs transfusion during this admission. Her total white blood count and platelets count are gradually improving. The patient is ready for discharge today. She was advised to follow up with me on an outpatient basis as previously scheduled. Thank you so much for taking good care of Mr. Lovena Le. Please call if you have any questions.

## 2014-01-12 NOTE — Discharge Summary (Signed)
Physician Discharge Summary  MAMTA RIMMER PPJ:[id number] DOB: August 02, 1941 DOA: 01/10/2014  PCP: [name], MD  Admit date: 01/10/2014 Discharge date: 01/12/2014  Time spent: 35 minutes  Recommendations for Outpatient Follow-up:  1. PCP in 1 week, may need HCTZ resumed if BP up and diarrhea resolved 2. Dr.[name] in 2 weeks  Discharge Diagnoses:  Principal Problem:   Bloody diarrhea   Mucositis   HYPERTENSION   Stage 3A Lung cancer, middle lobe   Colitis   Anemia of chronic disease   Hypothyroidism   Hypokalemia  Discharge Condition: improved  Diet recommendation: regular  Filed Weights   01/11/14 0141 01/11/14 0500 01/12/14 0512  Weight: 69.673 kg (153 lb 9.6 oz) 69.627 kg (153 lb 8 oz) 70.988 kg (156 lb 8 oz)    History of present illness:  Kimberly [name] is a 73 y.o. female with Past medical history of lung cancer, hypertension, hypothyroidism, GERD.  The patient presented with complaints of diarrhea with blood. She mentions that since last night she started having increasing frequency of bowel motions which his lows and is bright red in color.  She mentions with her chemotherapy she always gets constipation. Last Monday she had a chemotherapy and she was constipated on till Friday.  She had Friday bowel movement which was initially black and later on she started having bright red to dark maroon bowel movements. She also mentions whenever she had an urge to possibly as she leaks bowels.  She denies any similar episodes in the past. She denies any abdominal pain, fever, chills. She has esophageal irritation due to radiation which is unchanged. She is on liquid diet for last many months.  She denies any nausea or vomiting she burning urination leg swelling or rash similar bleeding anywhere else headache focal neurological deficit chest pain cough.  She had a colonoscopy a few years ago which was negative for any acute abnormality. She denies any acid  reflux   Hospital Course:  Mucositis/GI bleed  -trace bleeding and mucus in stools -improving  -most likely Mucositis secondary to Taxol(Chemo) -normal colonoscopy in last 2 years per Pt report  -Improved with supportive care, IVF, diet advanced -On empiric Cipro/flagyl, continue for 5 more days -GI pathogen panel pending   Anemia  -due to chronic disease/chemo  -scant blood loss, hemodilution -will transfuse 1 unit PRBC today prior to discharge   Stage 3A [place] Lung CA  -on chemo per Dr.[name]  -Dr.[name] stopped by and saw her this am-Thank you  Hypothyroidism  Continue Synthroid   Hypertension  Continue Lopressor  -stopped HCTZ due to ongoing diarrhea and hypokalemia  Hypokalemia  -replaced PO and IV   Consultations:  Onc-Dr.[name]  Discharge Exam: Filed Vitals:   01/12/14 0512  BP: 132/59  Pulse: 82  Temp: 97.4 F (36.3 C)  Resp: 16    General: AAOx3 Cardiovascular: S1S2/RRR Respiratory: CTAB  Discharge Instructions You were cared for by a hospitalist during your hospital stay. If you have any questions about your discharge medications or the care you received while you were in the hospital after you are discharged, you can call the unit and asked to speak with the hospitalist on call if the hospitalist that took care of you is not available. Once you are discharged, your primary care physician will handle any further medical issues. Please note that NO REFILLS for any discharge medications will be authorized once you are discharged, as it is imperative that you return to your primary care physician (or  establish a relationship with a primary care physician if you do not have one) for your aftercare needs so that they can reassess your need for medications and monitor your lab values.   Future Appointments Provider Department Dept Phone   02/12/2014 11:00 AM [facility] [phone]   02/12/2014 11:40 AM Blair Promise, MD [facility] [phone]   02/13/2014 8:00 AM [facility] [phone]   Liquids only 4 hours prior to your exam. Any medications can be taken as usual. Please arrive 15 min prior to your scheduled exam time.   02/16/2014 8:45 AM Curt Bears, MD [facility] [phone]       Medication List    STOP taking these medications       irbesartan-hydrochlorothiazide 300-12.5 MG per tablet  Commonly known as:  AVALIDE      TAKE these medications       atorvastatin 20 MG tablet  Commonly known as:  LIPITOR  Take 20 mg by mouth daily.     ciprofloxacin 250 MG tablet  Commonly known as:  CIPRO  Take 1 tablet (250 mg total) by mouth 2 (two) times daily. For 5 days     EPIPEN IJ  Inject 1 each as directed once as needed (for reaction to milk products).     fluconazole 100 MG tablet  Commonly known as:  DIFLUCAN  Take 2 tablets by mouth on day one, then take 1 tablet by mouth daily until completed. Do not take yout Lipitor while taking the Diflucan     hyaluronate sodium Gel  Apply 1 application topically 2 (two) times daily.     levothyroxine 125 MCG tablet  Commonly known as:  SYNTHROID, LEVOTHROID  Take 125 mcg by mouth daily before breakfast.     metoprolol succinate 25 MG 24 hr tablet  Commonly known as:  TOPROL-XL  Take 12.5 mg by mouth daily.     metroNIDAZOLE 500 MG tablet  Commonly known as:  FLAGYL  Take 1 tablet (500 mg total) by mouth 3 (three) times daily. For 5 days     montelukast 10 MG tablet  Commonly known as:  SINGULAIR  Take 10 mg by mouth daily.     pantoprazole 40 MG tablet  Commonly known as:  PROTONIX  Take 40 mg by mouth 2 (two) times daily.     prochlorperazine 10 MG tablet  Commonly known as:  COMPAZINE  Take 1 tablet (10 mg total) by mouth every 6 (six) hours as needed for nausea or vomiting.     ranitidine 300 MG tablet   Commonly known as:  ZANTAC  Take 300 mg by mouth at bedtime.     sucralfate 1 GM/10ML suspension  Commonly known as:  CARAFATE  Take 10 mLs (1 g total) by mouth 4 (four) times daily -  with meals and at bedtime.     traMADol 50 MG tablet  Commonly known as:  ULTRAM  Take 25 mg by mouth every 6 (six) hours as needed for moderate pain.       Allergies  Allergen Reactions  . Milk-Related Compounds Other (See Comments)    REACTION: GI upset, projectile vomiting      The results of significant diagnostics from this hospitalization (including imaging, microbiology, ancillary and laboratory) are listed below for reference.    Significant Diagnostic Studies: Acute Abdominal Series  2014-01-17  CLINICAL DATA:  Bloody diarrhea.  Chemotherapy for lung cancer  EXAM: ACUTE ABDOMEN SERIES (ABDOMEN 2 VIEW & CHEST 1 VIEW)  COMPARISON:  12/02/2013 chest x-ray  FINDINGS: Normal heart size. Chronic right middle lobe collapse, postobstructive. Stable 8 mm nodule in the left lower lobe. There is no edema, consolidation, effusion, or pneumothorax. The surgical clips at the left thoracic inlet, likely thyroidal.  Nonobstructive bowel gas pattern. No suspicious intra-abdominal mass effect or calcification. No acute osseous findings.  IMPRESSION: 1. Negative abdominal radiographs. 2. Chronic postobstructive right middle lobe collapse. No acute intrathoracic findings.   Electronically Signed   By: Jorje Guild M.D.   On: 01/11/2014 01:49    Microbiology: Recent Results (from the past 240 hour(s))  CULTURE, BLOOD (ROUTINE X 2)     Status: None   Collection Time    01/10/14 10:00 PM      Result Value Ref Range Status   Specimen Description BLOOD LEFT ARM   Final   Special Requests BOTTLES DRAWN AEROBIC AND ANAEROBIC 4CC   Final   Culture  Setup Time     Final   Value: 01/11/2014 01:17     Performed at [facility]   Culture     Final   Value:        BLOOD CULTURE RECEIVED NO GROWTH TO DATE  CULTURE WILL BE HELD FOR 5 DAYS BEFORE ISSUING A FINAL NEGATIVE REPORT     Performed at [facility]   Report Status PENDING   Incomplete  CULTURE, BLOOD (ROUTINE X 2)     Status: None   Collection Time    01/10/14 10:05 PM      Result Value Ref Range Status   Specimen Description BLOOD RIGHT HAND   Final   Special Requests BOTTLES DRAWN AEROBIC AND ANAEROBIC 2CC   Final   Culture  Setup Time     Final   Value: 01/11/2014 01:17     Performed at [facility]   Culture     Final   Value:        BLOOD CULTURE RECEIVED NO GROWTH TO DATE CULTURE WILL BE HELD FOR 5 DAYS BEFORE ISSUING A FINAL NEGATIVE REPORT     Performed at [facility]   Report Status PENDING   Incomplete  CLOSTRIDIUM DIFFICILE BY PCR     Status: None   Collection Time    01/10/14 11:53 PM      Result Value Ref Range Status   C difficile by pcr NEGATIVE  NEGATIVE Final   Comment: Performed at [facility]: Basic Metabolic Panel:  Recent Labs Lab 01/10/14 2030 01/11/14 0332 01/11/14 0815 01/12/14 0417  NA 136* 139  --  137  K 3.0* 2.8* 3.2* 3.3*  CL 93* 98  --  101  CO2 26 27  --  22  GLUCOSE 127* 87  --  72  BUN 13 10  --  7  CREATININE 0.94 0.88  --  0.93  CALCIUM 8.0* 7.3*  --  7.3*   Liver Function Tests:  Recent Labs Lab 01/10/14 2030 01/11/14 0332  AST 22 18  ALT 18 15  ALKPHOS 89 76  BILITOT 0.7 0.5  PROT 6.3 5.5*  ALBUMIN 3.3* 2.7*   No results found for this basename: LIPASE, AMYLASE,  in the last 168 hours No results found for this basename: AMMONIA,  in the last 168 hours CBC:  Recent Labs Lab 01/10/14  2030 01/11/14 0332 01/12/14 0417  WBC 0.4* 0.8* 0.9*  NEUTROABS 0.3* 0.5*  --   HGB 18.6* 8.5* 7.5*  HCT 51.4* 24.5* 21.7*  MCV 85.7 86.9 87.1  PLT 47* 107* 110*   Cardiac Enzymes: No results found for this basename: CKTOTAL, CKMB, CKMBINDEX, TROPONINI,  in the last 168 hours BNP: BNP (last 3 results) No results found for this  basename: PROBNP,  in the last 8760 hours CBG: No results found for this basename: GLUCAP,  in the last 168 hours     Signed:  [facility] 01/12/2014, 10:48 AM

## 2014-01-13 LAB — TYPE AND SCREEN
ABO/RH(D): O POS
Antibody Screen: NEGATIVE
Unit division: 0

## 2014-01-15 ENCOUNTER — Encounter: Payer: Self-pay | Admitting: Radiation Oncology

## 2014-01-15 NOTE — Progress Notes (Signed)
  Radiation Oncology         [phone] ________________________________  Name: Kimberly [name] MRN: [id number]  Date: 01/15/2014  DOB: 1941/04/30  End of Treatment Note  Diagnosis:      Lung cancer, middle lobe    Primary site: Lung (Right)   Staging method: AJCC 7th Edition   Clinical: Stage IIIA (T3, N2, M0)   Summary: Stage IIIA (T3, N2, M0)   Indication for treatment:  Local regional control, initially unresectable, potentially pre-op       Radiation treatment dates:   March 30 through May 7  Site/dose:   Right central chest 50.4 gray in 28 fractions  Beams/energy:   3-D conformal using three-field technique, 10 MV photons  Narrative: The patient tolerated radiation treatment relatively well.   She did experience some esophageal symptoms requiring IV fluid supplementation as well as hydrocodone elixir. Her cough improved during the course of treatment  Plan: The patient has completed radiation treatment. The patient will return to radiation oncology clinic for routine followup in one month. I advised them to call or return sooner if they have any questions or concerns related to their recovery or treatment.  -----------------------------------  Blair Promise, PhD, MD

## 2014-01-17 LAB — CULTURE, BLOOD (ROUTINE X 2)
Culture: NO GROWTH
Culture: NO GROWTH

## 2014-01-29 ENCOUNTER — Encounter: Payer: Self-pay | Admitting: *Deleted

## 2014-02-04 ENCOUNTER — Other Ambulatory Visit: Payer: Self-pay | Admitting: Internal Medicine

## 2014-02-04 NOTE — Telephone Encounter (Signed)
Rx was sent to pharmacy electronically. 

## 2014-02-07 ENCOUNTER — Emergency Department (HOSPITAL_COMMUNITY): Payer: Medicare Other

## 2014-02-07 ENCOUNTER — Inpatient Hospital Stay (HOSPITAL_COMMUNITY)
Admission: EM | Admit: 2014-02-07 | Discharge: 2014-02-09 | DRG: 309 | Disposition: A | Discharge status: Home / Self Care | Payer: Medicare Other | Attending: Internal Medicine | Admitting: Internal Medicine

## 2014-02-07 ENCOUNTER — Encounter (HOSPITAL_COMMUNITY): Payer: Self-pay | Admitting: Emergency Medicine

## 2014-02-07 DIAGNOSIS — F411 Generalized anxiety disorder: Secondary | ICD-10-CM | POA: Diagnosis present

## 2014-02-07 DIAGNOSIS — C3490 Malignant neoplasm of unspecified part of unspecified bronchus or lung: Secondary | ICD-10-CM

## 2014-02-07 DIAGNOSIS — Z96698 Presence of other orthopedic joint implants: Secondary | ICD-10-CM

## 2014-02-07 DIAGNOSIS — Z7982 Long term (current) use of aspirin: Secondary | ICD-10-CM

## 2014-02-07 DIAGNOSIS — I480 Paroxysmal atrial fibrillation: Secondary | ICD-10-CM | POA: Diagnosis present

## 2014-02-07 DIAGNOSIS — E785 Hyperlipidemia, unspecified: Secondary | ICD-10-CM | POA: Diagnosis present

## 2014-02-07 DIAGNOSIS — I10 Essential (primary) hypertension: Secondary | ICD-10-CM | POA: Diagnosis present

## 2014-02-07 DIAGNOSIS — R55 Syncope and collapse: Secondary | ICD-10-CM

## 2014-02-07 DIAGNOSIS — I959 Hypotension, unspecified: Secondary | ICD-10-CM | POA: Diagnosis present

## 2014-02-07 DIAGNOSIS — E039 Hypothyroidism, unspecified: Secondary | ICD-10-CM | POA: Diagnosis present

## 2014-02-07 DIAGNOSIS — Z87891 Personal history of nicotine dependence: Secondary | ICD-10-CM

## 2014-02-07 DIAGNOSIS — I080 Rheumatic disorders of both mitral and aortic valves: Secondary | ICD-10-CM | POA: Diagnosis present

## 2014-02-07 DIAGNOSIS — D649 Anemia, unspecified: Secondary | ICD-10-CM | POA: Diagnosis present

## 2014-02-07 DIAGNOSIS — R079 Chest pain, unspecified: Secondary | ICD-10-CM

## 2014-02-07 DIAGNOSIS — K219 Gastro-esophageal reflux disease without esophagitis: Secondary | ICD-10-CM | POA: Diagnosis present

## 2014-02-07 DIAGNOSIS — I059 Rheumatic mitral valve disease, unspecified: Secondary | ICD-10-CM

## 2014-02-07 DIAGNOSIS — I4891 Unspecified atrial fibrillation: Principal | ICD-10-CM

## 2014-02-07 DIAGNOSIS — R0781 Pleurodynia: Secondary | ICD-10-CM | POA: Diagnosis present

## 2014-02-07 DIAGNOSIS — Z79899 Other long term (current) drug therapy: Secondary | ICD-10-CM

## 2014-02-07 DIAGNOSIS — E876 Hypokalemia: Secondary | ICD-10-CM | POA: Diagnosis present

## 2014-02-07 LAB — URINE MICROSCOPIC-ADD ON

## 2014-02-07 LAB — COMPREHENSIVE METABOLIC PANEL
ALBUMIN: 2.6 g/dL — AB (ref 3.5–5.2)
ALK PHOS: 85 U/L (ref 39–117)
ALK PHOS: 75 U/L (ref 39–117)
ALT: 15 U/L (ref 0–35)
ALT: 12 U/L (ref 0–35)
AST: 17 U/L (ref 0–37)
AST: 17 U/L (ref 0–37)
Albumin: 2.9 g/dL — ABNORMAL LOW (ref 3.5–5.2)
BILIRUBIN TOTAL: 0.6 mg/dL (ref 0.3–1.2)
BUN: 14 mg/dL (ref 6–23)
BUN: 14 mg/dL (ref 6–23)
CALCIUM: 8.5 mg/dL (ref 8.4–10.5)
CHLORIDE: 105 meq/L (ref 96–112)
CO2: 26 meq/L (ref 19–32)
CO2: 25 mEq/L (ref 19–32)
Calcium: 8.1 mg/dL — ABNORMAL LOW (ref 8.4–10.5)
Chloride: 99 mEq/L (ref 96–112)
Creatinine, Ser: 1.01 mg/dL (ref 0.50–1.10)
Creatinine, Ser: 0.95 mg/dL (ref 0.50–1.10)
GFR calc non Af Amer: 59 mL/min — ABNORMAL LOW (ref >90)
GFR, EST AFRICAN AMERICAN: 64 mL/min — AB (ref >90)
GFR, EST AFRICAN AMERICAN: 69 mL/min — AB (ref >90)
GFR, EST NON AFRICAN AMERICAN: 55 mL/min — AB (ref >90)
GLUCOSE: 132 mg/dL — AB (ref 70–99)
GLUCOSE: 109 mg/dL — AB (ref 70–99)
Potassium: 3.2 mEq/L — ABNORMAL LOW (ref 3.7–5.3)
Potassium: 3.5 mEq/L — ABNORMAL LOW (ref 3.7–5.3)
SODIUM: 142 meq/L (ref 137–147)
Sodium: 138 mEq/L (ref 137–147)
TOTAL PROTEIN: 5.5 g/dL — AB (ref 6.0–8.3)
Total Bilirubin: 0.6 mg/dL (ref 0.3–1.2)
Total Protein: 5.9 g/dL — ABNORMAL LOW (ref 6.0–8.3)

## 2014-02-07 LAB — CBC
HEMATOCRIT: 29.8 % — AB (ref 36.0–46.0)
HEMOGLOBIN: 10.1 g/dL — AB (ref 12.0–15.0)
MCH: 30.8 pg (ref 26.0–34.0)
MCHC: 33.9 g/dL (ref 30.0–36.0)
MCV: 90.9 fL (ref 78.0–100.0)
Platelets: 275 10*3/uL (ref 150–400)
RBC: 3.28 MIL/uL — ABNORMAL LOW (ref 3.87–5.11)
RDW: 17 % — ABNORMAL HIGH (ref 11.5–15.5)
WBC: 11.4 10*3/uL — ABNORMAL HIGH (ref 4.0–10.5)

## 2014-02-07 LAB — URINALYSIS, ROUTINE W REFLEX MICROSCOPIC
BILIRUBIN URINE: NEGATIVE
Glucose, UA: NEGATIVE mg/dL
HGB URINE DIPSTICK: NEGATIVE
Ketones, ur: NEGATIVE mg/dL
Nitrite: NEGATIVE
PROTEIN: NEGATIVE mg/dL
Specific Gravity, Urine: 1.006 (ref 1.005–1.030)
UROBILINOGEN UA: 0.2 mg/dL (ref 0.0–1.0)
pH: 6.5 (ref 5.0–8.0)

## 2014-02-07 LAB — MRSA PCR SCREENING: MRSA by PCR: NEGATIVE

## 2014-02-07 LAB — PRO B NATRIURETIC PEPTIDE: Pro B Natriuretic peptide (BNP): 2388 pg/mL — ABNORMAL HIGH (ref 0–125)

## 2014-02-07 LAB — TROPONIN I: Troponin I: <0.3 ng/mL (ref <0.30)

## 2014-02-07 LAB — TSH
TSH: 0.809 u[IU]/mL (ref 0.350–4.500)
TSH: 1.92 u[IU]/mL (ref 0.350–4.500)

## 2014-02-07 MED ORDER — HEPARIN (PORCINE) IN NACL 100-0.45 UNIT/ML-% IJ SOLN
850.0000 [IU]/h | INTRAMUSCULAR | Status: DC
  Administered 2014-02-07: 850 [IU]/h via INTRAVENOUS
  Filled 2014-02-07: qty 250

## 2014-02-07 MED ORDER — PANTOPRAZOLE SODIUM 40 MG PO TBEC
40.0000 mg | DELAYED_RELEASE_TABLET | Freq: Two times a day (BID) | ORAL | Status: DC
Start: 2014-02-07 — End: 2014-02-09
  Administered 2014-02-07 – 2014-02-09 (×4): 40 mg via ORAL
  Filled 2014-02-07 (×4): qty 1

## 2014-02-07 MED ORDER — MORPHINE SULFATE 2 MG/ML IJ SOLN: 2.0000 mg | INTRAMUSCULAR | Status: DC | PRN

## 2014-02-07 MED ORDER — TRAMADOL HCL 50 MG PO TABS: 25.0000 mg | ORAL_TABLET | Freq: Four times a day (QID) | ORAL | Status: DC | PRN

## 2014-02-07 MED ORDER — SODIUM CHLORIDE 0.9 % IV BOLUS (SEPSIS)
500.0000 mL | Freq: Once | INTRAVENOUS | Status: AC
  Administered 2014-02-07: 500 mL via INTRAVENOUS

## 2014-02-07 MED ORDER — LEVOTHYROXINE SODIUM 125 MCG PO TABS
125.0000 ug | ORAL_TABLET | Freq: Every day | ORAL | Status: DC
  Administered 2014-02-08 – 2014-02-09 (×2): 125 ug via ORAL
  Filled 2014-02-07 (×3): qty 1

## 2014-02-07 MED ORDER — AMIODARONE HCL IN DEXTROSE 360-4.14 MG/200ML-% IV SOLN: 30.0000 mg/h | INTRAVENOUS | Status: DC

## 2014-02-07 MED ORDER — MONTELUKAST SODIUM 10 MG PO TABS
10.0000 mg | ORAL_TABLET | Freq: Every day | ORAL | Status: DC
  Administered 2014-02-07 – 2014-02-09 (×3): 10 mg via ORAL
  Filled 2014-02-07 (×3): qty 1

## 2014-02-07 MED ORDER — ATORVASTATIN CALCIUM 20 MG PO TABS
20.0000 mg | ORAL_TABLET | Freq: Every day | ORAL | Status: DC
  Administered 2014-02-07 – 2014-02-08 (×2): 20 mg via ORAL
  Filled 2014-02-07 (×3): qty 1

## 2014-02-07 MED ORDER — DILTIAZEM LOAD VIA INFUSION
5.0000 mg | Freq: Once | INTRAVENOUS | Status: AC
  Administered 2014-02-07: 5 mg via INTRAVENOUS
  Filled 2014-02-07: qty 5

## 2014-02-07 MED ORDER — AMIODARONE IV BOLUS ONLY 150 MG/100ML
150.0000 mg | Freq: Once | INTRAVENOUS | Status: DC
  Filled 2014-02-07: qty 100

## 2014-02-07 MED ORDER — PROCHLORPERAZINE MALEATE 10 MG PO TABS
10.0000 mg | ORAL_TABLET | Freq: Four times a day (QID) | ORAL | Status: DC | PRN
  Filled 2014-02-07: qty 1

## 2014-02-07 MED ORDER — HEPARIN BOLUS VIA INFUSION
3000.0000 [IU] | Freq: Once | INTRAVENOUS | Status: AC
  Administered 2014-02-07: 3000 [IU] via INTRAVENOUS
  Filled 2014-02-07: qty 3000

## 2014-02-07 MED ORDER — METOPROLOL SUCCINATE 12.5 MG HALF TABLET
12.5000 mg | ORAL_TABLET | Freq: Every day | ORAL | Status: DC
  Administered 2014-02-09: 12.5 mg via ORAL
  Filled 2014-02-07 (×3): qty 1

## 2014-02-07 MED ORDER — POTASSIUM CHLORIDE CRYS ER 20 MEQ PO TBCR
40.0000 meq | EXTENDED_RELEASE_TABLET | Freq: Once | ORAL | Status: AC
  Administered 2014-02-07: 40 meq via ORAL
  Filled 2014-02-07: qty 2

## 2014-02-07 MED ORDER — FAMOTIDINE 40 MG PO TABS
40.0000 mg | ORAL_TABLET | Freq: Every day | ORAL | Status: DC
  Administered 2014-02-07 – 2014-02-08 (×2): 40 mg via ORAL
  Filled 2014-02-07 (×3): qty 1

## 2014-02-07 MED ORDER — DILTIAZEM HCL 100 MG IV SOLR
10.0000 mg/h | INTRAVENOUS | Status: DC
  Administered 2014-02-07: 10 mg/h via INTRAVENOUS

## 2014-02-07 MED ORDER — AMIODARONE HCL IN DEXTROSE 360-4.14 MG/200ML-% IV SOLN: 60.0000 mg/h | INTRAVENOUS | Status: DC

## 2014-02-07 MED ORDER — AMIODARONE HCL 200 MG PO TABS
400.0000 mg | ORAL_TABLET | Freq: Two times a day (BID) | ORAL | Status: DC
  Administered 2014-02-07 – 2014-02-09 (×4): 400 mg via ORAL
  Filled 2014-02-07 (×5): qty 2

## 2014-02-07 NOTE — H&P (Addendum)
Kimberly [name] is a 73 y.o. female  Admit Date: 02/07/2014 Referring Physician: Ashok Cordia, MD [facility] Primary Cardiologist:: Wells Guiles, MD Chief complaint / reason for admission: Mid sternal pleuritic chest pain and atrial fibrillation with rapid ventricular response  HPI: Very pleasant 73 year old female with a history of nonresectable squamous cell lung cancer who is on chemotherapy and radiation therapy since January 2015. Last evening she began noticing midsternal parotic chest pain. A deep breath because of knifelike discomfort. Lying on the left side will cause the same type discomfort. She also noted that when she stood she got very weak. She did not feel palpitations. After these complaints continued through the night she called EMS this morning and was found to be in atrial fibrillation with a rapid ventricular response. She also had low blood pressure.  According to the patient she and Dr. Debara Pickett have evaluated recurrent syncopal episodes including continuous monitoring without evidence of atrial fibrillation.    PMH:    Past Medical History  Diagnosis Date  . Hypertension   . Thyroid disease   . GERD (gastroesophageal reflux disease)   . Cancer   . Dyslipidemia   . Aortic insufficiency   . Mitral insufficiency   . History of nuclear stress test 02/26/2006    exercise myoview; normal pattern of perfusion; low risk scan   . PONV (postoperative nausea and vomiting)   . Heart murmur   . Hypothyroidism   . Cough     dry, endobronchial mass  . Pneumonia     pus bronchitis  . Arthritis   . Syncope     "states she has passed out a few times. dr is trying to find cause.last time when geeting ready to go home after video bronch/bx  . Shortness of breath     with exertion  . Anxiety     due to surgery   . Bronchitis     PSH:    Past Surgical History  Procedure Laterality Date  . Thyroidectomy  1973  . Dilation and curettage of uterus    . Carpal tunnel release Right  1988  . Rotator cuff repair  2006    ? side  . H/o met test w/pft  04/02/2012    low risk; peak VO2 77% predicted  . Cardiac catheterization  07/08/2008    normal coronaries  . Transthoracic echocardiogram  09/25/2012    EF 55-60%; mild LVH & mild concentric hypertrophy; mild AV regurg; RV systolic pressure increase consistent with mild pulm HTN  . Video bronchoscopy Bilateral 09/25/2013    Procedure: VIDEO BRONCHOSCOPY WITHOUT FLUORO;  Surgeon: Tanda Rockers, MD;  Location: [facility];  Service: Cardiopulmonary;  Laterality: Bilateral;  . Joint replacement  2003    thumb rt  . Knee arthroscopy  12    rt meniscus  . Video bronchoscopy with endobronchial ultrasound N/A 10/30/2013    Procedure: VIDEO BRONCHOSCOPY WITH ENDOBRONCHIAL ULTRASOUND;  Surgeon: Melrose Nakayama, MD;  Location: [place];  Service: Thoracic;  Laterality: N/A;  . Mediastinoscopy N/A 10/30/2013    Procedure: MEDIASTINOSCOPY;  Surgeon: Melrose Nakayama, MD;  Location: [place];  Service: Thoracic;  Laterality: N/A;  . Eye surgery Bilateral   . Video assisted thoracoscopy (vats)/ lobectomy Right 11/13/2013    Procedure: VIDEO ASSISTED THORACOSCOPY (VATS) with mediastinal  biopsies;  Surgeon: Melrose Nakayama, MD;  Location: [place];  Service: Thoracic;  Laterality: Right;  RIGHT VATS,mediastinal biopsies   ALLERGIES:   Milk-related  compounds Prior to Admit Meds:   (Not in a hospital admission) Family HX:    Family History  Problem Relation Age of Onset  . Lung cancer Mother   . Heart attack Father   . Heart failure Maternal Grandfather   . Heart attack Paternal Grandfather    Social HX:    History   Social History  . Marital Status: Divorced    Spouse Name: N/A    Number of Children: 2  . Years of Education: N/A   Occupational History  . Not on file.   Social History Main Topics  . Smoking status: Former Smoker -- 0.50 packs/day for 6 years    Types: Cigarettes    Quit date: 09/05/1967  . Smokeless  tobacco: Never Used     Comment: occ wine  . Alcohol Use: Yes     Comment: occasional 1-2 drinks/month  . Drug Use: No  . Sexual Activity: Not Currently   Other Topics Concern  . Not on file   Social History Narrative  . No narrative on file     ROS Tremendous weight loss over the past year, perhaps 30-40 pounds. Poor appetite. Denies lower from the swelling and edema. Two-pillow orthopnea. No blood in urine or stool.  Physical Exam: Blood pressure 98/49, pulse 120, temperature 97.8 F (36.6 C), temperature source Oral, resp. rate 16, SpO2 96.00%.   Patient is lying relatively flat because of hypotension. She feels somewhat short of breath. Her skin is pale. She is without here due to chemotherapy. Neck exam reveals marked JVD bilateral to the angle of the jaw with the patient lying at 45. No carotid bruits are heard. Chest exam reveals diminished breath sounds right greater than left. No wheezing or rales are heard. Cardiac exam reveals distant heart sounds. Irregularly irregular rhythm. Abdomen is soft. No tenderness or mass affect is felt. Extremities reveal no edema. Pulses are 2+ and symmetric. The neurological exam is intact. No obvious motor or sensory deficits are noted.  Labs: Lab Results  Component Value Date   WBC 11.4* 02/07/2014   HGB 10.1* 02/07/2014   HCT 29.8* 02/07/2014   MCV 90.9 02/07/2014   PLT 275 02/07/2014    Recent Labs Lab 02/07/14 1100  NA 138  K 3.2*  CL 99  CO2 26  BUN 14  CREATININE 1.01  CALCIUM 8.5  PROT 5.9*  BILITOT 0.6  ALKPHOS 85  ALT 15  AST 17  GLUCOSE 132*   Lab Results  Component Value Date   CKTOTAL 80 05/31/2011   CKMB 2.6 05/31/2011   TROPONINI <0.30 02/07/2014     Radiology:  Heart is not enlarged on chest x-ray. It does appear to be a right pleural effusion EKG:  Atrial fibrillation with rapid ventricular response and no ST-T wave abnormality  ASSESSMENT:  1. Atrial fibrillation with rapid ventricular response. Given  her clinical history, pericardial invasion needs to be occluded. 2. Hypotension, in the setting of markedly elevated neck veins, raises the question of pericardial involvement, pulmonary hypertension, PE, etc. 3. Pleuritic chest pain, see above 4. History of recurrent syncope, previously unexplained by ambulatory telemetry 5. Hypokalemia 6. Squamous cell carcinoma of the lung, currently on chemotherapy  Plan:  1. Continue IV fluids to help maintain systolic blood pressure 2. Start amiodarone to help control and hopefully convert atrial fibrillation 3. Will get an urgent echocardiogram to exclude the possibility of pericardial effusion 4. If the echo is unremarkable for pericardial effusion, will start IV  heparin 5. May also need to use digoxin to help slow heart rate 6. Guarded prognosis 7. Replete potassium  This workup require prolonged bedside evaluation and continuous evaluation of data including an echocardiogram which was ordered urgently to exclude pericardial effusion. Time spent with patient greater than one hour.  Belva Crome III 02/07/2014 12:47 PM

## 2014-02-07 NOTE — ED Notes (Addendum)
Pt from home with c/o sharp central chest pain starting last night at 10pm radiating to her central back worse with inspiration.  Pt took 325 aspirin, given 500 mL NS by EMS.  EMS states pt is in a-fib with RVR, HR 87-218, no hx of the same.  Pt reports dizziness, but denies SOB, N/V/D, not orthostatic.  Pt in NAD, A&O.

## 2014-02-07 NOTE — ED Notes (Signed)
Dr Ashok Cordia gave verbal order to stop Cardizem.

## 2014-02-07 NOTE — Progress Notes (Signed)
Echo Lab  2D Echocardiogram completed.  Lapel, RDCS 02/07/2014 1:26 PM

## 2014-02-07 NOTE — Progress Notes (Signed)
ANTICOAGULATION CONSULT NOTE - Initial Consult  Pharmacy Consult for heparin Indication: atrial fibrillation  Allergies  Allergen Reactions  . Milk-Related Compounds Other (See Comments)    REACTION: GI upset, projectile vomiting    Patient Measurements: Height: 4\' 11"  (149.9 cm) Weight: 156 lb 8.4 oz (71 kg) IBW/kg (Calculated) : 43.2 Heparin Dosing Weight: 63kg  Vital Signs: Temp: 99 F (37.2 C) (06/06 1502) Temp src: Oral (06/06 1502) BP: 94/54 mmHg (06/06 1502) Pulse Rate: 81 (06/06 1450)  Labs:  Recent Labs  02/07/14 1100  HGB 10.1*  HCT 29.8*  PLT 275  CREATININE 1.01  TROPONINI <0.30    Estimated Creatinine Clearance: 44.4 ml/min (by C-G formula based on Cr of 1.01).   Medical History: Past Medical History  Diagnosis Date  . Hypertension   . Thyroid disease   . GERD (gastroesophageal reflux disease)   . Cancer   . Dyslipidemia   . Aortic insufficiency   . Mitral insufficiency   . History of nuclear stress test 02/26/2006    exercise myoview; normal pattern of perfusion; low risk scan   . PONV (postoperative nausea and vomiting)   . Heart murmur   . Hypothyroidism   . Cough     dry, endobronchial mass  . Pneumonia     pus bronchitis  . Arthritis   . Syncope     "states she has passed out a few times. dr is trying to find cause.last time when geeting ready to go home after video bronch/bx  . Shortness of breath     with exertion  . Anxiety     due to surgery   . Bronchitis     Assessment: 39 YOF who presented in AFib with RVR to start heparin. Has been worked up for syncopal episodes in the past without evidence of AFib. Not on anticoagulation PTA.  Baseline Hgb 10.2, plts 275. No bleeding noted.  Goal of Therapy:  Heparin level 0.3-0.7 units/ml Monitor platelets by anticoagulation protocol: Yes   Plan:  1. Heparin bolus with 3000 units IV x1 2. Start heparin drip at 850 units/hr 3. Heparin level in 8 hours 4. Daily HL and CBC 5.  Follow for s/s bleeding  Anitha Kreiser D. Hester Forget, PharmD, BCPS Clinical Pharmacist Pager: [phone] 02/07/2014 3:58 PM

## 2014-02-07 NOTE — ED Provider Notes (Signed)
CSN: [id number]     Arrival date & time 02/07/14  1028 History   First MD Initiated Contact with Patient 02/07/14 1041     Chief Complaint  Patient presents with  . Atrial Fibrillation  . Chest Pain     (Consider location/radiation/quality/duration/timing/severity/associated sxs/prior Treatment) Patient is a 73 y.o. female presenting with atrial fibrillation and chest pain. The history is provided by the patient.  Atrial Fibrillation Associated symptoms include chest pain. Pertinent negatives include no abdominal pain, no headaches and no shortness of breath.  Chest Pain Associated symptoms: no abdominal pain, no back pain, no cough, no fever, no headache, no palpitations, no shortness of breath and not vomiting   pt with hx htn, lung cancer, c/o onset pain mid chest last pm at rest. Constant since, dull. Non radiating. No specific exacerbating or alleviating factors. Pt arrives in afib w hr 160's. Pt denies palpitations. No sob, nv or diaphoresis. Notes hx syncope, but had holter which failed to show significant dysrhythmia. No hx afib or svt. Denies hx cad, reports prior cath w no cad.  Denies leg pain or swelling. No hx dvt or pe.  Denies etoh use. Pt states last chemo or radiation rx for lung ca was 3+ weeks ago. No heat intol, sweats, skin/hair changes - takes synthroid.  No recent change in meds or doses.   Past Medical History  Diagnosis Date  . Hypertension   . Thyroid disease   . GERD (gastroesophageal reflux disease)   . Cancer   . Dyslipidemia   . Aortic insufficiency   . Mitral insufficiency   . History of nuclear stress test 02/26/2006    exercise myoview; normal pattern of perfusion; low risk scan   . PONV (postoperative nausea and vomiting)   . Heart murmur   . Hypothyroidism   . Cough     dry, endobronchial mass  . Pneumonia     pus bronchitis  . Arthritis   . Syncope     "states she has passed out a few times. dr is trying to find cause.last time when geeting  ready to go home after video bronch/bx  . Shortness of breath     with exertion  . Anxiety     due to surgery   . Bronchitis    Past Surgical History  Procedure Laterality Date  . Thyroidectomy  1973  . Dilation and curettage of uterus    . Carpal tunnel release Right 1988  . Rotator cuff repair  2006    ? side  . H/o met test w/pft  04/02/2012    low risk; peak VO2 77% predicted  . Cardiac catheterization  07/08/2008    normal coronaries  . Transthoracic echocardiogram  09/25/2012    EF 55-60%; mild LVH & mild concentric hypertrophy; mild AV regurg; RV systolic pressure increase consistent with mild pulm HTN  . Video bronchoscopy Bilateral 09/25/2013    Procedure: VIDEO BRONCHOSCOPY WITHOUT FLUORO;  Surgeon: Tanda Rockers, MD;  Location: [facility];  Service: Cardiopulmonary;  Laterality: Bilateral;  . Joint replacement  2003    thumb rt  . Knee arthroscopy  12    rt meniscus  . Video bronchoscopy with endobronchial ultrasound N/A 10/30/2013    Procedure: VIDEO BRONCHOSCOPY WITH ENDOBRONCHIAL ULTRASOUND;  Surgeon: Melrose Nakayama, MD;  Location: [place];  Service: Thoracic;  Laterality: N/A;  . Mediastinoscopy N/A 10/30/2013    Procedure: MEDIASTINOSCOPY;  Surgeon: Melrose Nakayama, MD;  Location: [place];  Service:  Thoracic;  Laterality: N/A;  . Eye surgery Bilateral   . Video assisted thoracoscopy (vats)/ lobectomy Right 11/13/2013    Procedure: VIDEO ASSISTED THORACOSCOPY (VATS) with mediastinal  biopsies;  Surgeon: Melrose Nakayama, MD;  Location: [place];  Service: Thoracic;  Laterality: Right;  RIGHT VATS,mediastinal biopsies   Family History  Problem Relation Age of Onset  . Lung cancer Mother   . Heart attack Father   . Heart failure Maternal Grandfather   . Heart attack Paternal Grandfather    History  Substance Use Topics  . Smoking status: Former Smoker -- 0.50 packs/day for 6 years    Types: Cigarettes    Quit date: 09/05/1967  . Smokeless tobacco: Never  Used     Comment: occ wine  . Alcohol Use: Yes     Comment: occasional 1-2 drinks/month   OB History   Grav Para Term Preterm Abortions TAB SAB Ect Mult Living                 Review of Systems  Constitutional: Negative for fever and chills.  HENT: Negative for sore throat.   Eyes: Negative for redness.  Respiratory: Negative for cough and shortness of breath.   Cardiovascular: Positive for chest pain. Negative for palpitations and leg swelling.  Gastrointestinal: Negative for vomiting, abdominal pain, diarrhea and blood in stool.  Genitourinary: Negative for dysuria and flank pain.  Musculoskeletal: Negative for back pain and neck pain.  Skin: Negative for rash.  Neurological: Negative for syncope and headaches.  Hematological: Does not bruise/bleed easily.  Psychiatric/Behavioral: Negative for confusion.      Allergies  Milk-related compounds  Home Medications   Prior to Admission medications   Medication Sig Start Date End Date Taking? Authorizing Provider  atorvastatin (LIPITOR) 20 MG tablet Take 20 mg by mouth daily.   Yes Historical Provider, MD  EPINEPHrine (EPIPEN IJ) Inject 1 each as directed once as needed (for reaction to milk products).   Yes Historical Provider, MD  levothyroxine (SYNTHROID, LEVOTHROID) 125 MCG tablet Take 125 mcg by mouth daily before breakfast.   Yes Historical Provider, MD  metoprolol succinate (TOPROL-XL) 25 MG 24 hr tablet Take 12.5 mg by mouth daily.   Yes Historical Provider, MD  montelukast (SINGULAIR) 10 MG tablet Take 10 mg by mouth daily. 04/15/13  Yes Historical Provider, MD  pantoprazole (PROTONIX) 40 MG tablet Take 40 mg by mouth 2 (two) times daily.    Yes Historical Provider, MD  prochlorperazine (COMPAZINE) 10 MG tablet Take 1 tablet (10 mg total) by mouth every 6 (six) hours as needed for nausea or vomiting. 12/01/13  Yes Curt Bears, MD  ranitidine (ZANTAC) 300 MG tablet Take 300 mg by mouth at bedtime.   Yes Historical  Provider, MD  traMADol (ULTRAM) 50 MG tablet Take 25 mg by mouth every 6 (six) hours as needed for moderate pain.   Yes Historical Provider, MD  ciprofloxacin (CIPRO) 250 MG tablet Take 1 tablet (250 mg total) by mouth 2 (two) times daily. For 5 days 01/12/14   Domenic Polite, MD  fluconazole (DIFLUCAN) 100 MG tablet Take 2 tablets by mouth on day one, then take 1 tablet by mouth daily until completed. Do not take yout Lipitor while taking the Diflucan 01/05/14   Adrena E Johnson, PA-C  metroNIDAZOLE (FLAGYL) 500 MG tablet Take 1 tablet (500 mg total) by mouth 3 (three) times daily. For 5 days 01/12/14   Domenic Polite, MD   BP 91/52  Pulse 145  Resp 19  SpO2 98% Physical Exam  Nursing note and vitals reviewed. Constitutional: She is oriented to person, place, and time.  Pt markedly tachycardic.   HENT:  Mouth/Throat: Oropharynx is clear and moist.  Eyes: Conjunctivae are normal. No scleral icterus.  Neck: Neck supple. No tracheal deviation present. No thyromegaly present.  Cardiovascular: Normal heart sounds and intact distal pulses.  Exam reveals no gallop and no friction rub.   No murmur heard. Tachycardic, irregular.   Pulmonary/Chest: Effort normal and breath sounds normal. No respiratory distress. She exhibits no tenderness.  Abdominal: Soft. Normal appearance and bowel sounds are normal. She exhibits no distension. There is no tenderness.  Genitourinary:  No cva tenderness  Musculoskeletal: She exhibits no edema and no tenderness.  Neurological: She is alert and oriented to person, place, and time.  Skin: Skin is warm and dry. No rash noted.  Psychiatric: She has a normal mood and affect.    ED Course  Procedures (including critical care time) Labs Review  Results for orders placed during the hospital encounter of 02/07/14  CBC      Result Value Ref Range   WBC 11.4 (*) 4.0 - 10.5 K/uL   RBC 3.28 (*) 3.87 - 5.11 MIL/uL   Hemoglobin 10.1 (*) 12.0 - 15.0 g/dL   HCT 29.8 (*)  36.0 - 46.0 %   MCV 90.9  78.0 - 100.0 fL   MCH 30.8  26.0 - 34.0 pg   MCHC 33.9  30.0 - 36.0 g/dL   RDW 17.0 (*) 11.5 - 15.5 %   Platelets 275  150 - 400 K/uL  COMPREHENSIVE METABOLIC PANEL      Result Value Ref Range   Sodium 138  137 - 147 mEq/L   Potassium 3.2 (*) 3.7 - 5.3 mEq/L   Chloride 99  96 - 112 mEq/L   CO2 26  19 - 32 mEq/L   Glucose, Bld 132 (*) 70 - 99 mg/dL   BUN 14  6 - 23 mg/dL   Creatinine, Ser 1.01  0.50 - 1.10 mg/dL   Calcium 8.5  8.4 - 10.5 mg/dL   Total Protein 5.9 (*) 6.0 - 8.3 g/dL   Albumin 2.9 (*) 3.5 - 5.2 g/dL   AST 17  0 - 37 U/L   ALT 15  0 - 35 U/L   Alkaline Phosphatase 85  39 - 117 U/L   Total Bilirubin 0.6  0.3 - 1.2 mg/dL   GFR calc non Af Amer 55 (*) >90 mL/min   GFR calc Af Amer 64 (*) >90 mL/min  TROPONIN I      Result Value Ref Range   Troponin I <0.30  <0.30 ng/mL  TSH      Result Value Ref Range   TSH 0.809  0.350 - 4.500 uIU/mL   Dg Chest Port 1 View  02/07/2014   CLINICAL DATA:  Shortness of breath.  History of pneumonia.  EXAM: PORTABLE CHEST - 1 VIEW  COMPARISON:  Chest x-ray 01/11/2014.  FINDINGS: Persistent ill-defined opacities throughout the right middle lobe appears worse compared to the prior examination. Probable small right pleural effusion. Right hilar fullness. The patient is rotated to the right on today's exam, resulting in distortion of the mediastinal contours and reduced diagnostic sensitivity and specificity for mediastinal pathology. Heart size is normal. Atherosclerosis in the thoracic aorta.  IMPRESSION: 1. Persistent right hilar fullness and ill-defined opacity throughout the right middle lobe, presumably related to residual tumor in this patient with  history of lung cancer. Findings suggest progression of disease compared to prior studies. 2. Small right pleural effusion appear slightly increased compared to prior examinations.   Electronically Signed   By: Vinnie Langton M.D.   On: 02/07/2014 11:42   Acute  Abdominal Series  01/11/2014   CLINICAL DATA:  Bloody diarrhea.  Chemotherapy for lung cancer  EXAM: ACUTE ABDOMEN SERIES (ABDOMEN 2 VIEW & CHEST 1 VIEW)  COMPARISON:  12/02/2013 chest x-ray  FINDINGS: Normal heart size. Chronic right middle lobe collapse, postobstructive. Stable 8 mm nodule in the left lower lobe. There is no edema, consolidation, effusion, or pneumothorax. The surgical clips at the left thoracic inlet, likely thyroidal.  Nonobstructive bowel gas pattern. No suspicious intra-abdominal mass effect or calcification. No acute osseous findings.  IMPRESSION: 1. Negative abdominal radiographs. 2. Chronic postobstructive right middle lobe collapse. No acute intrathoracic findings.   Electronically Signed   By: Jorje Guild M.D.   On: 01/11/2014 01:49        EKG Interpretation   Date/Time:  Saturday February 07 2014 10:39:07 EDT Ventricular Rate:  156 PR Interval:    QRS Duration: 88 QT Interval:  310 QTC Calculation: 499 R Axis:   6 Text Interpretation:  Atrial fibrillation with rapid V-rate ST depression,  probably rate related Confirmed by Bernarda Erck  MD, Ephriam Turman ([id number]) on 02/07/2014  10:47:09 AM      MDM  Iv ns bolus as bp low.  Continuous pulse ox and monitor. Ecg. Cxr. Labs.  bp mildly improved. Additional ns bolus. cardizem 5 mg bolus and gtt.  Reviewed nursing notes and prior charts for additional history.   Cardiology called to see/admit re new onset rapid afib and hypotension.  Repeat boluses ns iv given re low bp.  cardizem gtt initially slowed and then temporarily discontinued re low bp's.  w hr improving and ns boluses, bp improved.  Note that pt remained conscious and alert period of low bps, w no faintness or dizziness.   Recheck hr/bp improved, still afib. No cp.   Cardiology w pt.   CRITICAL CARE  RE new onset atrial fibrillation w rapid ventricular response, hypotension, chest pain Performed by: Mirna Mires Total critical care time: 40 Critical care  time was exclusive of separately billable procedures and treating other patients. Critical care was necessary to treat or prevent imminent or life-threatening deterioration. Critical care was time spent personally by me on the following activities: development of treatment plan with patient and/or surrogate as well as nursing, discussions with consultants, evaluation of patient's response to treatment, examination of patient, obtaining history from patient or surrogate, ordering and performing treatments and interventions, ordering and review of laboratory studies, ordering and review of radiographic studies, pulse oximetry and re-evaluation of patient's condition.      Mirna Mires, MD 02/07/14 [phone]

## 2014-02-07 NOTE — ED Notes (Signed)
Attempted report 

## 2014-02-08 DIAGNOSIS — R55 Syncope and collapse: Secondary | ICD-10-CM

## 2014-02-08 DIAGNOSIS — R079 Chest pain, unspecified: Secondary | ICD-10-CM

## 2014-02-08 DIAGNOSIS — C3490 Malignant neoplasm of unspecified part of unspecified bronchus or lung: Secondary | ICD-10-CM

## 2014-02-08 LAB — BASIC METABOLIC PANEL
BUN: 14 mg/dL (ref 6–23)
CHLORIDE: 108 meq/L (ref 96–112)
CO2: 25 mEq/L (ref 19–32)
Calcium: 8.1 mg/dL — ABNORMAL LOW (ref 8.4–10.5)
Creatinine, Ser: 0.97 mg/dL (ref 0.50–1.10)
GFR calc Af Amer: 67 mL/min — ABNORMAL LOW (ref >90)
GFR calc non Af Amer: 58 mL/min — ABNORMAL LOW (ref >90)
Glucose, Bld: 93 mg/dL (ref 70–99)
Potassium: 3.3 mEq/L — ABNORMAL LOW (ref 3.7–5.3)
Sodium: 144 mEq/L (ref 137–147)

## 2014-02-08 LAB — HEPARIN LEVEL (UNFRACTIONATED): Heparin Unfractionated: 0.45 IU/mL (ref 0.30–0.70)

## 2014-02-08 LAB — CBC
HEMATOCRIT: 25.8 % — AB (ref 36.0–46.0)
HEMOGLOBIN: 8.7 g/dL — AB (ref 12.0–15.0)
MCH: 30.6 pg (ref 26.0–34.0)
MCHC: 33.7 g/dL (ref 30.0–36.0)
MCV: 90.8 fL (ref 78.0–100.0)
Platelets: 223 10*3/uL (ref 150–400)
RBC: 2.84 MIL/uL — ABNORMAL LOW (ref 3.87–5.11)
RDW: 17.5 % — ABNORMAL HIGH (ref 11.5–15.5)
WBC: 7.9 10*3/uL (ref 4.0–10.5)

## 2014-02-08 LAB — T4, FREE: FREE T4: 1.52 ng/dL (ref 0.80–1.80)

## 2014-02-08 LAB — TROPONIN I: Troponin I: 0.58 ng/mL (ref <0.30)

## 2014-02-08 MED ORDER — POTASSIUM CHLORIDE CRYS ER 20 MEQ PO TBCR
40.0000 meq | EXTENDED_RELEASE_TABLET | Freq: Two times a day (BID) | ORAL | Status: AC
  Administered 2014-02-08 (×2): 40 meq via ORAL
  Filled 2014-02-08 (×2): qty 2

## 2014-02-08 MED ORDER — ENOXAPARIN SODIUM 80 MG/0.8ML ~~LOC~~ SOLN
70.0000 mg | Freq: Two times a day (BID) | SUBCUTANEOUS | Status: DC
  Administered 2014-02-08 – 2014-02-09 (×3): 70 mg via SUBCUTANEOUS
  Filled 2014-02-08 (×5): qty 0.8

## 2014-02-08 NOTE — Progress Notes (Signed)
CRITICAL VALUE ALERT  Critical value received:  Troponin 0.58  Date of notification:  02/08/2014 Time of notification:  0845 Critical value read back:yes  Nurse who received alert:  Annie Paras RN MD notified (1st page):  Lorenza Cambridge Time of first page:  [phone] MD notified (2nd page): Dr. Tamala Julian  Time of second page: [phone]  Responding MD:  Dr. Tamala Julian Time MD responded:  [phone]

## 2014-02-08 NOTE — Progress Notes (Signed)
ANTICOAGULATION CONSULT NOTE - Initial Consult  Pharmacy Consult for lovenox Indication: atrial fibrillation  Allergies  Allergen Reactions  . Milk-Related Compounds Other (See Comments)    REACTION: GI upset, projectile vomiting    Patient Measurements: Height: 4\' 11"  (149.9 cm) Weight: 156 lb 8.4 oz (71 kg) IBW/kg (Calculated) : 43.2 Heparin Dosing Weight: 63kg  Vital Signs: Temp: 97.6 F (36.4 C) (06/07 0400) Temp src: Oral (06/07 0400) BP: 107/61 mmHg (06/07 0400) Pulse Rate: 69 (06/07 0400)  Labs:  Recent Labs  02/07/14 1100 02/07/14 2123 02/08/14 0044  HGB 10.1*  --  8.7*  HCT 29.8*  --  25.8*  PLT 275  --  223  HEPARINUNFRC  --   --  0.45  CREATININE 1.01 0.95 0.97  TROPONINI <0.30  --   --     Estimated Creatinine Clearance: 46.3 ml/min (by C-G formula based on Cr of 0.97).   Medical History: Past Medical History  Diagnosis Date  . Hypertension   . Thyroid disease   . GERD (gastroesophageal reflux disease)   . Cancer   . Dyslipidemia   . Aortic insufficiency   . Mitral insufficiency   . History of nuclear stress test 02/26/2006    exercise myoview; normal pattern of perfusion; low risk scan   . PONV (postoperative nausea and vomiting)   . Heart murmur   . Hypothyroidism   . Cough     dry, endobronchial mass  . Pneumonia     pus bronchitis  . Arthritis   . Syncope     "states she has passed out a few times. dr is trying to find cause.last time when geeting ready to go home after video bronch/bx  . Shortness of breath     with exertion  . Anxiety     due to surgery   . Bronchitis     Assessment: 33 YOF who presented in AFib with RVR to start heparin. Has been worked up for syncopal episodes in the past without evidence of AFib. Not on anticoagulation PTA. Transition to lovenox today. May not need long term anticoagulation per note.  Goal of Therapy:  Anti-Xa = 0.6-1.2 Monitor platelets by anticoagulation protocol: Yes   Plan:   Dc  heparin  Lovenox 70mg  SQ q12 CBC q3days

## 2014-02-08 NOTE — Progress Notes (Signed)
Report called to [place] on 3W.

## 2014-02-08 NOTE — Progress Notes (Addendum)
       Patient Name: Kimberly [name] Date of Encounter: 02/08/2014    SUBJECTIVE: She had spontaneous conversion to normal sinus rhythm yesterday before IV amiodarone was started. With resolution of atrial fibrillation, the pleuritic chest discomfort has resolved. She is able to a deep breath without complaints.  TELEMETRY:  Normal sinus rhythm and has been maintained throughout the night Filed Vitals:   02/07/14 1951 02/07/14 2200 02/07/14 2317 02/08/14 0400  BP: 103/65 100/57 106/50 107/61  Pulse: 79 80 78 69  Temp: 98.1 F (36.7 C)  98.2 F (36.8 C) 97.6 F (36.4 C)  TempSrc: Oral  Oral Oral  Resp: 24 22 18 17   Height:      Weight:      SpO2: 98% 96% 95% 96%    Intake/Output Summary (Last 24 hours) at 02/08/14 0649 Last data filed at 02/08/14 0630  Gross per 24 hour  Intake    342 ml  Output    425 ml  Net    -83 ml   LABS: Basic Metabolic Panel:  Recent Labs  02/07/14 2123 02/08/14 0044  NA 142 144  K 3.5* 3.3*  CL 105 108  CO2 25 25  GLUCOSE 109* 93  BUN 14 14  CREATININE 0.95 0.97  CALCIUM 8.1* 8.1*   CBC:  Recent Labs  02/07/14 1100 02/08/14 0044  WBC 11.4* 7.9  HGB 10.1* 8.7*  HCT 29.8* 25.8*  MCV 90.9 90.8  PLT 275 223   Cardiac Enzymes:  Recent Labs  02/07/14 1100  TROPONINI <0.30   BNP    Component Value Date/Time   PROBNP 2388.0* 02/07/2014 2123    Radiology/Studies:  Chest x-ray: IMPRESSION:  1. Persistent right hilar fullness and ill-defined opacity  throughout the right middle lobe, presumably related to residual  tumor in this patient with history of lung cancer. Findings suggest  progression of disease compared to prior studies.  2. Small right pleural effusion appear slightly increased compared  to prior examinations.    Physical Exam: Blood pressure 107/61, pulse 69, temperature 97.6 F (36.4 C), temperature source Oral, resp. rate 17, height 4\' 11"  (1.499 m), weight 156 lb 8.4 oz (71 kg), SpO2 96.00%. Weight  change:   Wt Readings from Last 3 Encounters:  02/07/14 156 lb 8.4 oz (71 kg)  01/12/14 156 lb 8 oz (70.988 kg)  01/06/14 159 lb (72.122 kg)    Neck veins are flat  Diminished breath sounds bilaterally  No pericardial friction rub. S4 gallop is audible. No peripheral edema is noted.  ASSESSMENT:  1. Paroxysmal atrial fibrillation with hypotension and chest pain, resolved with spontaneous reversion to normal sinus rhythm  2. Lung cancer, non-resectable 3. Recurrent syncope, suspected to be nearly deviated 4. Diastolic dysfunction with elevated BNP suggesting acute diastolic heart failure related to onset of atrial fibrillation 5. Elevated troponin likely demand related  Plan:  1. Continue oral amiodarone load to prevent recurrent atrial fibrillation  2. With  locally invasive lung cancer, I am somewhat reluctant to start long-term oral anticoagulation. We should discuss this with the patient's oncologist prior to committing. 3. Continue heparin and converted to Lovenox which will allow the patient to ambulate 4. Expected discharge on 02/09/14   Signed, Belva Crome III 02/08/2014, 6:49 AM

## 2014-02-08 NOTE — Progress Notes (Signed)
1:39 AM   Heparin for afib--  Heparin level 0.45 with no bleeding noted.   Continue at 850/hr and f/u daily  labs.   Curlene Dolphin

## 2014-02-09 ENCOUNTER — Other Ambulatory Visit: Payer: Self-pay | Admitting: Internal Medicine

## 2014-02-09 DIAGNOSIS — D649 Anemia, unspecified: Secondary | ICD-10-CM

## 2014-02-09 LAB — CBC
HEMATOCRIT: 28.5 % — AB (ref 36.0–46.0)
Hemoglobin: 9.4 g/dL — ABNORMAL LOW (ref 12.0–15.0)
MCH: 30.2 pg (ref 26.0–34.0)
MCHC: 33 g/dL (ref 30.0–36.0)
MCV: 91.6 fL (ref 78.0–100.0)
Platelets: 265 10*3/uL (ref 150–400)
RBC: 3.11 MIL/uL — ABNORMAL LOW (ref 3.87–5.11)
RDW: 17.5 % — ABNORMAL HIGH (ref 11.5–15.5)
WBC: 5.2 10*3/uL (ref 4.0–10.5)

## 2014-02-09 LAB — BASIC METABOLIC PANEL
BUN: 13 mg/dL (ref 6–23)
CHLORIDE: 108 meq/L (ref 96–112)
CO2: 24 meq/L (ref 19–32)
CREATININE: 0.96 mg/dL (ref 0.50–1.10)
Calcium: 9.1 mg/dL (ref 8.4–10.5)
GFR calc Af Amer: 68 mL/min — ABNORMAL LOW (ref >90)
GFR calc non Af Amer: 59 mL/min — ABNORMAL LOW (ref >90)
GLUCOSE: 93 mg/dL (ref 70–99)
Potassium: 4.3 mEq/L (ref 3.7–5.3)
Sodium: 144 mEq/L (ref 137–147)

## 2014-02-09 LAB — TROPONIN I: Troponin I: 0.39 ng/mL (ref <0.30)

## 2014-02-09 MED ORDER — ENOXAPARIN SODIUM 100 MG/ML ~~LOC~~ SOLN
100.0000 mg | SUBCUTANEOUS | Status: DC
  Filled 2014-02-09: qty 1

## 2014-02-09 MED ORDER — AMIODARONE HCL 400 MG PO TABS: 400.0000 mg | ORAL_TABLET | Freq: Two times a day (BID) | ORAL | Status: DC

## 2014-02-09 MED ORDER — ASPIRIN EC 325 MG PO TBEC: 325.0000 mg | DELAYED_RELEASE_TABLET | Freq: Every day | ORAL | Status: DC

## 2014-02-09 NOTE — Progress Notes (Signed)
ANTICOAGULATION CONSULT NOTE - Follow-up  Pharmacy Consult for lovenox Indication: atrial fibrillation  Allergies  Allergen Reactions  . Milk-Related Compounds Other (See Comments)    REACTION: GI upset, projectile vomiting    Patient Measurements: Height: 4\' 11"  (149.9 cm) Weight: 155 lb 8 oz (70.534 kg) IBW/kg (Calculated) : 43.2  Vital Signs: Temp: 97.9 F (36.6 C) (06/08 0500) BP: 137/58 mmHg (06/08 0500) Pulse Rate: 68 (06/08 0500)  Labs:  Recent Labs  02/07/14 1100 02/07/14 2123 02/08/14 0044 02/08/14 0715 02/09/14 0415  HGB 10.1*  --  8.7*  --  9.4*  HCT 29.8*  --  25.8*  --  28.5*  PLT 275  --  223  --  265  HEPARINUNFRC  --   --  0.45  --   --   CREATININE 1.01 0.95 0.97  --  0.96  TROPONINI <0.30  --   --  0.58* 0.39*    Estimated Creatinine Clearance: 46.6 ml/min (by C-G formula based on Cr of 0.96).  Assessment: 73 YOF who presented in AFib with RVR continues on lovenox for anticoagulation. It appears pt is to be discharged on lovenox while plans for oral anticoagulation are determined. Will change to 1.5mg /kg daily dosing for lovenox for ease of outpatient administration. CBC is stable and no bleeding noted.   Goal of Therapy:  Anti-Xa = 0.6-1.2 Monitor platelets by anticoagulation protocol: Yes   Plan:  1. Change lovenox to 100mg  SQ Q24H (~1.5mg /kg/day) 2. F/u CBC as ordered 3. F/u plans for anticoagulation  Salome Arnt, PharmD, BCPS Pager # [phone] 02/09/2014 8:49 AM

## 2014-02-09 NOTE — Care Management Note (Signed)
    Page 1 of 1   02/09/2014     4:26:14 PM CARE MANAGEMENT NOTE 02/09/2014  Patient:  Kimberly [name], Kimberly [name]   Account Number:  [id number]  Date Initiated:  02/09/2014  Documentation initiated by:  Marvetta Gibbons  Subjective/Objective Assessment:   Pt admitted with afib     Action/Plan:   Anticipated DC Date:  02/09/2014   Anticipated DC Plan:  HOME/SELF CARE         Choice offered to / List presented to:             Status of service:  Completed, signed off Medicare Important Message given?  NA - LOS <3 / Initial given by admissions (If response is "NO", the following Medicare IM given date fields will be blank) Date Medicare IM given:   Date Additional Medicare IM given:    Discharge Disposition:  HOME/SELF CARE  Per UR Regulation:  Reviewed for med. necessity/level of care/duration of stay  If discussed at [place] of Stay Meetings, dates discussed:    Comments:

## 2014-02-09 NOTE — Care Management (Signed)
02-09-14 @ 1123 Initial Medicare IM signed by patient and signed copy left on chart. Ocie Cornfield [place], RN,BSN [phone]

## 2014-02-09 NOTE — Discharge Instructions (Signed)
Atrial Fibrillation Atrial fibrillation is a condition that causes your heart to beat irregularly. It may also cause your heart to beat faster than normal. Atrial fibrillation can prevent your heart from pumping blood normally. It increases your risk of stroke and heart problems. HOME CARE  Take medications as told by your doctor.  Only take medications that your doctor says are safe. Some medications can make the condition worse or happen again.  If blood thinners were prescribed by your doctor, take them exactly as told. Too much can cause bleeding. Too little and you will not have the needed protection against stroke and other problems.  Perform blood tests at home if told by your doctor.  Perform blood tests exactly as told by your doctor.  Do not drink alcohol.  Do not drink beverages with caffeine such as coffee, soda, and some teas.  Maintain a healthy weight.  Do not use diet pills unless your doctor says they are safe. They may make heart problems worse.  Follow diet instructions as told by your doctor.  Exercise regularly as told by your doctor.  Keep all follow-up appointments. GET HELP RIGHT AWAY IF:   You have chest or belly (abdominal) pain.  You feel sick to your stomach (nauseous)  You suddenly have swollen feet and ankles.  You feel dizzy.  You face, arms, or legs feel numb or weak.  There is a change in your vision or speech.  You notice a change in the speed, rhythm, or strength of your heartbeat.  You suddenly begin peeing (urinating) more often.  You get tired more easily when moving or exercising. MAKE SURE YOU:   Understand these instructions.  Will watch your condition.  Will get help right away if you are not doing well or get worse. Document Released: 05/30/2008 Document Revised: 12/16/2012 Document Reviewed: 10/01/2012 [facility] Patient Information 2014 [place].

## 2014-02-09 NOTE — Progress Notes (Signed)
Pt discharged home. Reviewed discharge instructions and education, gave prescription for Aspirin, all questions answered.

## 2014-02-09 NOTE — Progress Notes (Signed)
Utilization review completed- retro 

## 2014-02-09 NOTE — Discharge Summary (Signed)
Physician Discharge Summary     Patient ID: Kimberly [name] MRN: [id number] DOB/AGE: [id number] 71 y.o.  Admit date: 02/07/2014 Discharge date: 02/09/2014  Admission Diagnoses:    Atrial fibrillation with rapid ventricular response  Discharge Diagnoses:  Principal Problem:   Atrial fibrillation with rapid ventricular response Active Problems:   Pleuritic chest pain   Atrial fibrillation   Discharged Condition: stable  Hospital Course:   The patient is a ery pleasant 73 year old female with a history of nonresectable squamous cell lung cancer who is on chemotherapy and radiation therapy since January 2015. Last evening she began noticing midsternal parotic chest pain. A deep breath because of knifelike discomfort. Lying on the left side will cause the same type discomfort. She also noted that when she stood she got very weak. She did not feel palpitations. After these complaints continued through the night she called EMS this morning and was found to be in atrial fibrillation with a rapid ventricular response. She also had low blood pressure.  According to the patient she and Dr. Debara Pickett have evaluated recurrent syncopal episodes including continuous monitoring without evidence of atrial fibrillation.   The patient was admitted and started on Amiodarone.  Before the IV amio could be started she converted to NSR.  She was then loaded with PO 400mg  twice daily.  This will be decreased to daily after a week.  She was also given Lovenox but due to concerns for bleeding with lung cancer, and potential up coming surgery, she will be started on full dose ASA at discharge.   Potassium was replaced. Troponin trend down from 0.58. Probably demand isch. in the setting of anemia and RVR of 156BPM at admission. Echo: EF 60-65%, mild LVH. No pericardial effusion.   The patient was seen by Dr. Claiborne Billings who felt she was stable for DC home.      Office follow up:  Consider decreasing Amio dose to 200mg  Daily.     Consults: None  Significant Diagnostic Studies:   PORTABLE CHEST - 1 VIEW  COMPARISON: Chest x-ray 01/11/2014.  FINDINGS: Persistent ill-defined opacities throughout the right middle lobe appears worse compared to the prior examination. Probable small right pleural effusion. Right hilar fullness. The patient is rotated to the right on today's exam, resulting in distortion of the mediastinal contours and reduced diagnostic sensitivity and specificity for mediastinal pathology. Heart size is normal. Atherosclerosis in the thoracic aorta.  IMPRESSION: 1. Persistent right hilar fullness and ill-defined opacity throughout the right middle lobe, presumably related to residual tumor in this patient with history of lung cancer. Findings suggest progression of disease compared to prior studies. 2. Small right pleural effusion appear slightly increased compared to prior examinations.    Echo Study Conclusions  - Left ventricle: The cavity size was normal. Wall thickness was increased in a pattern of mild LVH. Systolic function was normal. The estimated ejection fraction was in the range of 60% to 65%. The study is not technically sufficient to allow evaluation of LV diastolic function. - Mitral valve: Calcified annulus. There was mild regurgitation. - Left atrium: The atrium was normal in size. - Atrial septum: Aneurysmal IAS. Cannot exclude PFO. - Tricuspid valve: Inadequate envelope to measure RVSP. - Systemic veins: The IVC measures <2.1 cm, but does not collapse >50%, suggesting an elevated RA pressure of 8 mmHg. - Pericardium, extracardiac: There was no pericardial effusion.    Treatments: See above  Discharge Exam: Blood pressure 137/58, pulse 68, temperature 97.9 F (36.6 C), temperature  source Oral, resp. rate 16, height 4\' 11"  (1.499 m), weight 155 lb 8 oz (70.534 kg), SpO2 98.00%.   Disposition: 01-Home or Self Care  Discharge Instructions   Diet - low  sodium heart healthy    Complete by:  As directed      Discharge instructions    Complete by:  As directed   Call Dr. Lysbeth Penner office if your previous symptoms return.     Increase activity slowly    Complete by:  As directed             Medication List    STOP taking these medications       ciprofloxacin 250 MG tablet  Commonly known as:  CIPRO     metroNIDAZOLE 500 MG tablet  Commonly known as:  FLAGYL      TAKE these medications       amiodarone 400 MG tablet  Commonly known as:  PACERONE  Take 1 tablet (400 mg total) by mouth 2 (two) times daily.     aspirin EC 325 MG tablet  Take 1 tablet (325 mg total) by mouth daily.     atorvastatin 20 MG tablet  Commonly known as:  LIPITOR  Take 20 mg by mouth daily.     EPIPEN IJ  Inject 1 each as directed once as needed (for reaction to milk products).     fluconazole 100 MG tablet  Commonly known as:  DIFLUCAN  Take 2 tablets by mouth on day one, then take 1 tablet by mouth daily until completed. Do not take yout Lipitor while taking the Diflucan     levothyroxine 125 MCG tablet  Commonly known as:  SYNTHROID, LEVOTHROID  Take 125 mcg by mouth daily before breakfast.     metoprolol succinate 25 MG 24 hr tablet  Commonly known as:  TOPROL-XL  Take 12.5 mg by mouth daily.     montelukast 10 MG tablet  Commonly known as:  SINGULAIR  Take 10 mg by mouth daily.     pantoprazole 40 MG tablet  Commonly known as:  PROTONIX  Take 40 mg by mouth 2 (two) times daily.     prochlorperazine 10 MG tablet  Commonly known as:  COMPAZINE  Take 1 tablet (10 mg total) by mouth every 6 (six) hours as needed for nausea or vomiting.     ranitidine 300 MG tablet  Commonly known as:  ZANTAC  Take 300 mg by mouth at bedtime.     traMADol 50 MG tablet  Commonly known as:  ULTRAM  Take 25 mg by mouth every 6 (six) hours as needed for moderate pain.           Follow-up Information   Follow up with [facility], NP On  02/25/2014. (11:30)    Specialty:  Cardiology   Contact information:   [address] [place] [address] [phone]      Greater than 30 minutes was spent completing the patient's discharge.    SignedTarri Fuller, [facility] 02/09/2014, 11:11 AM

## 2014-02-09 NOTE — Progress Notes (Signed)
Subjective: No CP or SOB  Objective: Vital signs in last 24 hours: Temp:  [97.8 F (36.6 C)-98.3 F (36.8 C)] 97.9 F (36.6 C) (06/08 0500) Pulse Rate:  [68-78] 68 (06/08 0500) Resp:  [16-19] 16 (06/08 0500) BP: (105-145)/(52-64) 137/58 mmHg (06/08 0500) SpO2:  [97 %-100 %] 98 % (06/08 0500) Weight:  [155 lb 8 oz (70.534 kg)] 155 lb 8 oz (70.534 kg) (06/08 0500) Last BM Date: 02/08/14  Intake/Output from previous day: 06/07 0701 - 06/08 0700 In: 120 [P.O.:120] Out: -  Intake/Output this shift:    Medications Current Facility-Administered Medications  Medication Dose Route Frequency Provider Last Rate Last Dose  . amiodarone (PACERONE) tablet 400 mg  400 mg Oral BID Pixie Casino, MD   400 mg at 02/09/14 2836  . atorvastatin (LIPITOR) tablet 20 mg  20 mg Oral q1800 Belva Crome III, MD   20 mg at 02/08/14 2132  . enoxaparin (LOVENOX) injection 100 mg  100 mg Subcutaneous Q24H Rande Lawman [place], [facility]      . famotidine (PEPCID) tablet 40 mg  40 mg Oral QHS Belva Crome III, MD   40 mg at 02/08/14 2132  . levothyroxine (SYNTHROID, LEVOTHROID) tablet 125 mcg  125 mcg Oral QAC breakfast Belva Crome III, MD   125 mcg at 02/09/14 [phone]  . metoprolol succinate (TOPROL-XL) 24 hr tablet 12.5 mg  12.5 mg Oral Daily Belva Crome III, MD   12.5 mg at 02/09/14 7654  . montelukast (SINGULAIR) tablet 10 mg  10 mg Oral Daily Belva Crome III, MD   10 mg at 02/09/14 6503  . morphine 2 MG/ML injection 2 mg  2 mg Intravenous Q2H PRN Belva Crome III, MD      . pantoprazole (PROTONIX) EC tablet 40 mg  40 mg Oral BID Belva Crome III, MD   40 mg at 02/09/14 5465  . prochlorperazine (COMPAZINE) tablet 10 mg  10 mg Oral Q6H PRN Sinclair Grooms, MD      . traMADol Veatrice Bourbon) tablet 25 mg  25 mg Oral Q6H PRN Sinclair Grooms, MD        PE: General appearance: alert, cooperative and no distress Lungs: clear to auscultation bilaterally Heart: regular rate and rhythm, S1, S2 normal,  no murmur, click, rub or gallop Extremities: No LEE Pulses: 2+ and symmetric Skin: Warm and dry Neurologic: Grossly normal  Lab Results:   Recent Labs  02/07/14 1100 02/08/14 0044 02/09/14 0415  WBC 11.4* 7.9 5.2  HGB 10.1* 8.7* 9.4*  HCT 29.8* 25.8* 28.5*  PLT 275 223 265   BMET  Recent Labs  02/07/14 2123 02/08/14 0044 02/09/14 0415  NA 142 144 144  K 3.5* 3.3* 4.3  CL 105 108 108  CO2 25 25 24   GLUCOSE 109* 93 93  BUN 14 14 13   CREATININE 0.95 0.97 0.96  CALCIUM 8.1* 8.1* 9.1    Assessment/Plan    Active Problems:   Atrial fibrillation with rapid ventricular response   Pleuritic chest pain   Elevated troponin   Hypokalemia   Hypothyroidism  Plan:   Continues in NSR in the 70-80's.  Loading PO amiodarone 400mg  BID for a week then drop to 400mg  Daily.  Also on Toprol XL  12.5mg  daily.  Will atleast start ASA at discharge.  Troponin trending down from 0.58.  Probably demand isch. in the setting of anemia and RVR of 156 at admission.  EF 60-65%, mild LVH.  No pericardial effusion.   Potassium replaced and WNL.     LOS: 2 days    Tarri Fuller PA-C 02/09/2014 10:08 AM   Patient seen and examined. Agree with assessment and plan. No chest pain or dyspnea. Maintaining NSR. DC today; f/u with Dr. Debara Pickett.   Troy Sine, MD, [facility] 02/09/2014 10:49 AM

## 2014-02-12 ENCOUNTER — Other Ambulatory Visit (HOSPITAL_BASED_OUTPATIENT_CLINIC_OR_DEPARTMENT_OTHER): Payer: Medicare Other

## 2014-02-12 ENCOUNTER — Ambulatory Visit
Admission: RE | Admit: 2014-02-12 | Discharge: 2014-02-12 | Disposition: A | Discharge status: Home / Self Care | Payer: Medicare Other | Source: Ambulatory Visit | Attending: Radiation Oncology | Admitting: Radiation Oncology

## 2014-02-12 ENCOUNTER — Other Ambulatory Visit: Payer: Self-pay | Admitting: *Deleted

## 2014-02-12 ENCOUNTER — Telehealth: Payer: Self-pay | Admitting: Cardiovascular Disease

## 2014-02-12 ENCOUNTER — Encounter: Payer: Self-pay | Admitting: Oncology

## 2014-02-12 VITALS — BP 170/80 | HR 68 | Temp 98.2°F | Resp 16 | Ht 59.0 in | Wt 157.0 lb

## 2014-02-12 DIAGNOSIS — C3491 Malignant neoplasm of unspecified part of right bronchus or lung: Secondary | ICD-10-CM

## 2014-02-12 DIAGNOSIS — C342 Malignant neoplasm of middle lobe, bronchus or lung: Secondary | ICD-10-CM

## 2014-02-12 HISTORY — DX: Reserved for inherently not codable concepts without codable children: IMO0001

## 2014-02-12 HISTORY — DX: Reserved for concepts with insufficient information to code with codable children: IMO0002

## 2014-02-12 LAB — COMPREHENSIVE METABOLIC PANEL (CC13)
ALK PHOS: 80 U/L (ref 40–150)
ALT: 41 U/L (ref 0–55)
AST: 54 U/L — AB (ref 5–34)
Albumin: 2.8 g/dL — ABNORMAL LOW (ref 3.5–5.0)
Anion Gap: 10 mEq/L (ref 3–11)
BUN: 10.5 mg/dL (ref 7.0–26.0)
CO2: 26 mEq/L (ref 22–29)
Calcium: 8.9 mg/dL (ref 8.4–10.4)
Chloride: 108 mEq/L (ref 98–109)
Creatinine: 1 mg/dL (ref 0.6–1.1)
Glucose: 90 mg/dl (ref 70–140)
Potassium: 4.1 mEq/L (ref 3.5–5.1)
Sodium: 144 mEq/L (ref 136–145)
Total Bilirubin: 0.33 mg/dL (ref 0.20–1.20)
Total Protein: 6 g/dL — ABNORMAL LOW (ref 6.4–8.3)

## 2014-02-12 LAB — CBC WITH DIFFERENTIAL/PLATELET
BASO%: 1.3 % (ref 0.0–2.0)
BASOS ABS: 0.1 10*3/uL (ref 0.0–0.1)
EOS%: 4.2 % (ref 0.0–7.0)
Eosinophils Absolute: 0.3 10*3/uL (ref 0.0–0.5)
HEMATOCRIT: 29.4 % — AB (ref 34.8–46.6)
HEMOGLOBIN: 10 g/dL — AB (ref 11.6–15.9)
LYMPH%: 18 % (ref 14.0–49.7)
MCH: 31.1 pg (ref 25.1–34.0)
MCHC: 33.9 g/dL (ref 31.5–36.0)
MCV: 91.6 fL (ref 79.5–101.0)
MONO#: 0.6 10*3/uL (ref 0.1–0.9)
MONO%: 8.9 % (ref 0.0–14.0)
NEUT#: 4.3 10*3/uL (ref 1.5–6.5)
NEUT%: 67.6 % (ref 38.4–76.8)
PLATELETS: 305 10*3/uL (ref 145–400)
RBC: 3.21 10*6/uL — ABNORMAL LOW (ref 3.70–5.45)
RDW: 18.4 % — ABNORMAL HIGH (ref 11.2–14.5)
WBC: 6.3 10*3/uL (ref 3.9–10.3)
lymph#: 1.1 10*3/uL (ref 0.9–3.3)

## 2014-02-12 MED ORDER — IRBESARTAN-HYDROCHLOROTHIAZIDE 150-12.5 MG PO TABS: 1.0000 | ORAL_TABLET | Freq: Every day | ORAL | Status: DC

## 2014-02-12 NOTE — Telephone Encounter (Signed)
Called patient and informed her that Dr. Debara Pickett recommended she restart the Irbesartan-hct @ 150-12.5 mg. Prescription sent to [facility] [address].

## 2014-02-12 NOTE — Telephone Encounter (Signed)
Restart irbesartan/HCTZ at 150/12.5 mg daily - she will need an Rx.  Dr. Debara Pickett

## 2014-02-12 NOTE — Telephone Encounter (Signed)
Patient states that when she was in the hospital over the last weekend her blood pressure dropped. Per patient Dr. Debara Pickett stopped her Irbesartan-Hct. She only takes 12. Mg of lopressor daily. Reports that her blood pressure has gone "back up." 144-315 systolic and dyastolic readings in the 40'G. Heart rate 68-70. Will defer to Dr. Yvette Rack for review and recommendations.

## 2014-02-12 NOTE — Progress Notes (Signed)
Kimberly [name] here for follow up after treatment to her right central chest.  She is reporting that her throat/chest is "scratchy" when she eats - especially vegetables.  She reports a frequent cough that keeps her up at night.  She is bringing up clear sputum.  She is wondering if she can take her cough medicine again.  She reports more shortness of breath with activity.  Her oxygen saturation on room air was 98% today.  She reports fatigue.  She was discharged from the hospital on 6/8 for atrial fibrillation.  Her bp on arrival was elevated at 170/80 and when rechecked was 168/81.  She was taken off her bp meds in the hospital due to her bp being low.  She reports that she had a red area on her right upper back.  Her skin is intact now.  She also reports having itching feelings on her skin like there is a bug but nothing is there.

## 2014-02-12 NOTE — Telephone Encounter (Signed)
When pt was discharged from the hospital her blood pressure was so low,they took her off IrbesartanHCTZ . Pt blood pressure is now running high,please call to advise.

## 2014-02-12 NOTE — Progress Notes (Signed)
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  Interval history is significant for the patient being diagnosed with atrial fibrillation. She was admitted for management of this issue. She continues to have significant fatigue which is likely related in part to this new issue. She continues to have some mild difficulties with swallowing solid foods. She has to chew  her food well. She has mild cough but no hemoptysis or pain within the chest area.                              ALLERGIES:  is allergic to milk-related compounds.  Meds: Current Outpatient Prescriptions  Medication Sig Dispense Refill  . amiodarone (PACERONE) 400 MG tablet Take 1 tablet (400 mg total) by mouth 2 (two) times daily.  60 tablet  5  . aspirin EC 325 MG tablet Take 1 tablet (325 mg total) by mouth daily.  30 tablet  0  . atorvastatin (LIPITOR) 20 MG tablet Take 20 mg by mouth daily.      [name] EPINEPHrine (EPIPEN IJ) Inject 1 each as directed once as needed (for reaction to milk products).      [name] levothyroxine (SYNTHROID, LEVOTHROID) 125 MCG tablet Take 125 mcg by mouth daily before breakfast.      . metoprolol succinate (TOPROL-XL) 25 MG 24 hr tablet Take 12.5 mg by mouth daily.      . montelukast (SINGULAIR) 10 MG tablet Take 10 mg by mouth daily.      . pantoprazole (PROTONIX) 40 MG tablet Take 40 mg by mouth 2 (two) times daily.       . ranitidine (ZANTAC) 300 MG tablet Take 300 mg by mouth at bedtime.      . traMADol (ULTRAM) 50 MG tablet Take 25 mg by mouth every 6 (six) hours as needed for moderate pain.      . fluconazole (DIFLUCAN) 100 MG tablet Take 2 tablets by mouth on  day one, then take 1 tablet by mouth daily until completed. Do not take yout Lipitor while taking the Diflucan  16 tablet  0  . prochlorperazine (COMPAZINE) 10 MG tablet Take 1 tablet (10 mg total) by mouth every 6 (six) hours as needed for nausea or vomiting.  30 tablet  1   No current facility-administered medications for this encounter.    Physical Findings: The patient is in no acute distress. Patient is alert and oriented.  height is 4\' 11"  (1.499 m) and weight is 157 lb (71.215 kg). Her oral temperature is 98.2 F (36.8 C). Her blood pressure is 170/80 and her pulse is 68. Her respiration is 16 and oxygen saturation is 98%. .  No palpable supraclavicular or axillary adenopathy. The lungs are clear to auscultation. The heart has regular rhythm and rate. Peripheral pulses are good and regular.  Lab Findings: Lab Results  Component Value Date   WBC 6.3 02/12/2014   HGB 10.0* 02/12/2014   HCT 29.4* 02/12/2014   MCV 91.6 02/12/2014   PLT 305 02/12/2014      Radiographic Findings: Dg Chest Port 1 View  02/07/2014  CLINICAL DATA:  Shortness of breath.  History of pneumonia.  EXAM: PORTABLE CHEST - 1 VIEW  COMPARISON:  Chest x-ray 01/11/2014.  FINDINGS: Persistent ill-defined opacities throughout the right middle lobe appears worse compared to the prior examination. Probable small right pleural effusion. Right hilar fullness. The patient is rotated to the right on today's exam, resulting in distortion of the mediastinal contours and reduced diagnostic sensitivity and specificity for mediastinal pathology. Heart size is normal. Atherosclerosis in the thoracic aorta.  IMPRESSION: 1. Persistent right hilar fullness and ill-defined opacity throughout the right middle lobe, presumably related to residual tumor in this patient with history of lung cancer. Findings suggest progression of disease compared to prior studies. 2. Small right pleural effusion appear slightly increased compared to prior  examinations.   Electronically Signed   By: Vinnie Langton M.D.   On: 02/07/2014 11:42    Impression:  The patient is recovering from the effects of radiation.    Plan:  When necessary followup in radiation oncology. Patient will undergo a chest CT scan later this week to evaluate her response to radiation and chemotherapy. She will followup with medical oncology early next week. She will discuss her elevated blood pressure with her cardiologist since her blood pressure medicines were recently discontinued. Possible surgery depending on the patient's response to her radiation and chemotherapy.  ____________________________________ Blair Promise, MD

## 2014-02-13 ENCOUNTER — Encounter (HOSPITAL_COMMUNITY): Payer: Self-pay

## 2014-02-13 ENCOUNTER — Ambulatory Visit (HOSPITAL_COMMUNITY)
Admission: RE | Admit: 2014-02-13 | Discharge: 2014-02-13 | Disposition: A | Discharge status: Home / Self Care | Payer: Medicare Other | Source: Ambulatory Visit | Attending: Physician Assistant | Admitting: Physician Assistant

## 2014-02-13 DIAGNOSIS — C342 Malignant neoplasm of middle lobe, bronchus or lung: Secondary | ICD-10-CM

## 2014-02-13 MED ORDER — IOHEXOL 300 MG/ML  SOLN
80.0000 mL | Freq: Once | INTRAMUSCULAR | Status: AC | PRN
  Administered 2014-02-13: 80 mL via INTRAVENOUS

## 2014-02-16 ENCOUNTER — Telehealth: Payer: Self-pay | Admitting: Internal Medicine

## 2014-02-16 ENCOUNTER — Ambulatory Visit (HOSPITAL_BASED_OUTPATIENT_CLINIC_OR_DEPARTMENT_OTHER): Payer: Medicare Other | Admitting: Internal Medicine

## 2014-02-16 ENCOUNTER — Encounter: Payer: Self-pay | Admitting: Internal Medicine

## 2014-02-16 ENCOUNTER — Telehealth: Payer: Self-pay | Admitting: *Deleted

## 2014-02-16 VITALS — BP 190/66 | HR 63 | Temp 98.0°F | Resp 18 | Ht 59.0 in | Wt 151.4 lb

## 2014-02-16 DIAGNOSIS — D649 Anemia, unspecified: Secondary | ICD-10-CM

## 2014-02-16 DIAGNOSIS — C342 Malignant neoplasm of middle lobe, bronchus or lung: Secondary | ICD-10-CM

## 2014-02-16 DIAGNOSIS — R599 Enlarged lymph nodes, unspecified: Secondary | ICD-10-CM

## 2014-02-16 DIAGNOSIS — R0989 Other specified symptoms and signs involving the circulatory and respiratory systems: Secondary | ICD-10-CM

## 2014-02-16 DIAGNOSIS — R5383 Other fatigue: Secondary | ICD-10-CM

## 2014-02-16 DIAGNOSIS — C3490 Malignant neoplasm of unspecified part of unspecified bronchus or lung: Secondary | ICD-10-CM

## 2014-02-16 DIAGNOSIS — R5381 Other malaise: Secondary | ICD-10-CM

## 2014-02-16 DIAGNOSIS — R0609 Other forms of dyspnea: Secondary | ICD-10-CM

## 2014-02-16 NOTE — Telephone Encounter (Signed)
lvm for pt regarding to 6.22 time change.Marland KitchenMarland Kitchen

## 2014-02-16 NOTE — Telephone Encounter (Signed)
gv and printed appts cehd adn avs for pt for June and July...emailed MW to add tx for 6.22

## 2014-02-16 NOTE — Telephone Encounter (Signed)
Per staff message and POF I have scheduled appts. Advised scheduler of appts. JMW  

## 2014-02-16 NOTE — Progress Notes (Signed)
[facility] [phone]   [phone]  OFFICE PROGRESS NOTE  [name], MD [address] [place] [address]  DIAGNOSIS: Unresectable, likely a stage IIIA (T2a, N2, M0) non-small cell lung cancer, squamous cell carcinoma diagnosed in February 2015.  PRIOR THERAPY:  1) On 11/13/2013 the patient underwent exploratory right VATS with mediastinal biopsy under the care of Dr. Roxan Hockey. 2) Concurrent chemoradiation with weekly carboplatin for AUC of 2 and paclitaxel 45 mg/M2, status post 6 cycles with partial response. First cycle was given on 12/01/2013.   CURRENT THERAPY: Consolidation chemotherapy with carboplatin for AUC of 5 and paclitaxel 175 mg/M2 every 3 weeks with Neulasta support. First dose 02/23/2014.  INTERVAL HISTORY: Kimberly [name] 73 y.o. female returns to the clinic today for accompanied by her 2 daughters. The patient is feeling fine today with no specific complaints except for mild fatigue. She tolerated the previous course of concurrent chemoradiation fairly well with no significant adverse effects except for pancytopenia which is completely resolved at this point except for mild anemia. She denied having any significant dysphagia. She denied having any significant nausea or vomiting, no fever or chills. She denied having any chest pain but continues to have shortness of breath with exertion with no hemoptysis. She had repeat CT scan of the chest performed recently and she is here for evaluation and discussion of her scan results.  MEDICAL HISTORY: Past Medical History  Diagnosis Date  . Hypertension   . Thyroid disease   . GERD (gastroesophageal reflux disease)   . Dyslipidemia   . Aortic insufficiency   . Mitral insufficiency   . History of nuclear stress test 02/26/2006    exercise myoview; normal pattern of perfusion; low risk scan   . PONV (postoperative nausea and vomiting)   . Heart murmur    . Hypothyroidism   . Cough     dry, endobronchial mass  . Pneumonia     pus bronchitis  . Arthritis   . Syncope     "states she has passed out a few times. dr is trying to find cause.last time when geeting ready to go home after video bronch/bx  . Shortness of breath     with exertion  . Anxiety     due to surgery   . Bronchitis   . Radiation 12/01/13-01/08/14    50.4 gray to right central chest  . Cancer     ALLERGIES:  is allergic to milk-related compounds.  MEDICATIONS:  Current Outpatient Prescriptions  Medication Sig Dispense Refill  . amiodarone (PACERONE) 400 MG tablet Take 1 tablet (400 mg total) by mouth 2 (two) times daily.  60 tablet  5  . aspirin EC 325 MG tablet Take 1 tablet (325 mg total) by mouth daily.  30 tablet  0  . atorvastatin (LIPITOR) 20 MG tablet Take 20 mg by mouth daily.      [name] EPINEPHrine (EPIPEN IJ) Inject 1 each as directed once as needed (for reaction to milk products).      . irbesartan-hydrochlorothiazide (AVALIDE) 150-12.5 MG per tablet Take 1 tablet by mouth daily.  30 tablet  6  . levothyroxine (SYNTHROID, LEVOTHROID) 125 MCG tablet Take 125 mcg by mouth daily before breakfast.      . metoprolol succinate (TOPROL-XL) 25 MG 24 hr tablet Take 12.5 mg by mouth daily.      . montelukast (SINGULAIR) 10 MG tablet Take 10 mg by mouth daily.      [name]  pantoprazole (PROTONIX) 40 MG tablet Take 40 mg by mouth 2 (two) times daily.       . ranitidine (ZANTAC) 300 MG tablet Take 300 mg by mouth at bedtime.      . traMADol (ULTRAM) 50 MG tablet Take 25 mg by mouth every 6 (six) hours as needed for moderate pain.       No current facility-administered medications for this visit.    SURGICAL HISTORY:  Past Surgical History  Procedure Laterality Date  . Thyroidectomy  1973  . Dilation and curettage of uterus    . Carpal tunnel release Right 1988  . Rotator cuff repair  2006    ? side  . H/o met test w/pft  04/02/2012    low risk; peak VO2 77% predicted  .  Cardiac catheterization  07/08/2008    normal coronaries  . Transthoracic echocardiogram  09/25/2012    EF 55-60%; mild LVH & mild concentric hypertrophy; mild AV regurg; RV systolic pressure increase consistent with mild pulm HTN  . Video bronchoscopy Bilateral 09/25/2013    Procedure: VIDEO BRONCHOSCOPY WITHOUT FLUORO;  Surgeon: Tanda Rockers, MD;  Location: [facility];  Service: Cardiopulmonary;  Laterality: Bilateral;  . Joint replacement  2003    thumb rt  . Knee arthroscopy  12    rt meniscus  . Video bronchoscopy with endobronchial ultrasound N/A 10/30/2013    Procedure: VIDEO BRONCHOSCOPY WITH ENDOBRONCHIAL ULTRASOUND;  Surgeon: Melrose Nakayama, MD;  Location: [facility];  Service: Thoracic;  Laterality: N/A;  . Mediastinoscopy N/A 10/30/2013    Procedure: MEDIASTINOSCOPY;  Surgeon: Melrose Nakayama, MD;  Location: [facility];  Service: Thoracic;  Laterality: N/A;  . Eye surgery Bilateral   . Video assisted thoracoscopy (vats)/ lobectomy Right 11/13/2013    Procedure: VIDEO ASSISTED THORACOSCOPY (VATS) with mediastinal  biopsies;  Surgeon: Melrose Nakayama, MD;  Location: [facility];  Service: Thoracic;  Laterality: Right;  RIGHT VATS,mediastinal biopsies    REVIEW OF SYSTEMS:  Constitutional: positive for fatigue Eyes: negative Ears, nose, mouth, throat, and face: negative Respiratory: positive for cough and dyspnea on exertion Cardiovascular: negative Gastrointestinal: negative Genitourinary:negative Integument/breast: negative Hematologic/lymphatic: negative Musculoskeletal:negative Neurological: negative Behavioral/Psych: negative Endocrine: negative Allergic/Immunologic: negative   PHYSICAL EXAMINATION: General appearance: alert, cooperative, fatigued and no distress Head: Normocephalic, without obvious abnormality, atraumatic Neck: no adenopathy, no JVD, supple, symmetrical, trachea midline and thyroid not enlarged, symmetric, no tenderness/mass/nodules Lymph nodes:  Cervical, supraclavicular, and axillary nodes normal. Resp: clear to auscultation bilaterally Back: symmetric, no curvature. ROM normal. No CVA tenderness. Cardio: regular rate and rhythm, S1, S2 normal, no murmur, click, rub or gallop GI: soft, non-tender; bowel sounds normal; no masses,  no organomegaly Extremities: extremities normal, atraumatic, no cyanosis or edema Neurologic: Alert and oriented X 3, normal strength and tone. Normal symmetric reflexes. Normal coordination and gait  ECOG PERFORMANCE STATUS: 1 - Symptomatic but completely ambulatory  Blood pressure 190/66, pulse 63, temperature 98 F (36.7 C), temperature source Oral, resp. rate 18, height '4\' 11"'  (1.499 m), weight 151 lb 6.4 oz (68.675 kg), SpO2 97.00%.  LABORATORY DATA: Lab Results  Component Value Date   WBC 6.3 02/12/2014   HGB 10.0* 02/12/2014   HCT 29.4* 02/12/2014   MCV 91.6 02/12/2014   PLT 305 02/12/2014      Chemistry      Component Value Date/Time   NA 144 02/12/2014 1110   NA 144 02/09/2014 0415   K 4.1 02/12/2014 1110   K 4.3 02/09/2014 0415  CL 108 02/09/2014 0415   CO2 26 02/12/2014 1110   CO2 24 02/09/2014 0415   BUN 10.5 02/12/2014 1110   BUN 13 02/09/2014 0415   CREATININE 1.0 02/12/2014 1110   CREATININE 0.96 02/09/2014 0415      Component Value Date/Time   CALCIUM 8.9 02/12/2014 1110   CALCIUM 9.1 02/09/2014 0415   ALKPHOS 80 02/12/2014 1110   ALKPHOS 75 02/07/2014 2123   AST 54* 02/12/2014 1110   AST 17 02/07/2014 2123   ALT 41 02/12/2014 1110   ALT 12 02/07/2014 2123   BILITOT 0.33 02/12/2014 1110   BILITOT 0.6 02/07/2014 2123       RADIOGRAPHIC STUDIES: Ct Chest W Contrast  02/13/2014   CLINICAL DATA:  Lung cancer diagnosed 2/15 with chemotherapy and radiation therapy complete. Cough. 30 pound weight loss. Shortness of breath. Restaging of non-small-cell lung cancer.  EXAM: CT CHEST WITH CONTRAST  TECHNIQUE: Multidetector CT imaging of the chest was performed during intravenous contrast administration.   CONTRAST:  26m OMNIPAQUE IOHEXOL 300 MG/ML  SOLN  COMPARISON:  02/07/2014 chest radiograph. PET 10/15/2013. Chest CT 09/19/2013.  FINDINGS: Lungs/Pleura: Slight improvement in right middle lobe bronchial aeration.  Mild centrilobular emphysema. Calcified granulomas within the left lower lobe.  Improved right middle lobe aeration. Central lung lesion and surrounding volume loss are difficult to differentiate. An area of relative hypoattenuation centrally measures 3.7 x 2.6 cm on image 27/series 2. Compare 5.5 x 3.3 cm on the prior PET.  Trace bilateral pleural effusions, larger on the right. New since prior PET. Minimal loculated anterior right pleural fluid which is also new.  Heart/Mediastinum: No supraclavicular adenopathy. A left-sided thyroidectomy.  Descending thoracic aortic atherosclerosis. Mild cardiomegaly with coronary artery atherosclerosis. No central pulmonary embolism, on this non-dedicated study. 1.0 cm subcarinal node on image 25 is similar to on the prior PET and was hypermetabolic on that study. No hilar adenopathy.  Upper Abdomen: Suspicion of mild hepatic steatosis. Normal imaged portions of the spleen, stomach, pancreas, adrenal glands, kidneys.  Bones/Musculoskeletal:  No acute osseous abnormality.  IMPRESSION: 1. Improved right middle lobe aeration. Although the central mass is difficult to differentiate from surrounding volume loss, felt to be decreased in size. 2. A subcarinal node is similar in size to on the prior exam. Not pathologic by size criteria, but hypermetabolic on prior PET. 3. No evidence of new or progressive disease. 4. New small bilateral pleural effusions with small volume anterior loculated right pleural fluid. 5.  Atherosclerosis, including within the coronary arteries.   Electronically Signed   By: KAbigail MiyamotoM.D.   On: 02/13/2014 11:05   Dg Chest Port 1 View  02/07/2014   CLINICAL DATA:  Shortness of breath.  History of pneumonia.  EXAM: PORTABLE CHEST - 1 VIEW   COMPARISON:  Chest x-ray 01/11/2014.  FINDINGS: Persistent ill-defined opacities throughout the right middle lobe appears worse compared to the prior examination. Probable small right pleural effusion. Right hilar fullness. The patient is rotated to the right on today's exam, resulting in distortion of the mediastinal contours and reduced diagnostic sensitivity and specificity for mediastinal pathology. Heart size is normal. Atherosclerosis in the thoracic aorta.  IMPRESSION: 1. Persistent right hilar fullness and ill-defined opacity throughout the right middle lobe, presumably related to residual tumor in this patient with history of lung cancer. Findings suggest progression of disease compared to prior studies. 2. Small right pleural effusion appear slightly increased compared to prior examinations.   Electronically Signed   By:  Vinnie Langton M.D.   On: 02/07/2014 11:42   ASSESSMENT AND PLAN: This is a very pleasant 74 years old white female with unresectable stage IIIa non-small cell lung cancer completed a course of concurrent chemoradiation with weekly carboplatin and paclitaxel status post 6 cycles.  She tolerated her previous course of concurrent chemoradiation fairly well except for fatigue and pancytopenia Her recent CT scan of the chest showed partial response but persistent mediastinal lymphadenopathy. I discussed the scan results with the patient and her family. I do think the patient is a surgical candidate at this point.  I gave her the option of continuous observation and close monitoring versus proceeding with 3 cycles of consolidation chemotherapy with carboplatin for AUC of 5 and paclitaxel 175 mg/M2 every 3 weeks with Neulasta support. The patient is interested in proceeding with the consolidation chemotherapy. She is expected to start the first cycle of this treatment on 02/23/2014. I discussed with the patient adverse effect of this treatment including but not limited to alopecia,  myelosuppression, nausea and vomiting, peripheral neuropathy, liver or renal dysfunction. She would come back for followup visit in 4 weeks with the start of cycle #2 of her chemotherapy. She was advised to call immediately if she has any concerning symptoms in the interval. The patient voices understanding of current disease status and treatment options and is in agreement with the current care plan.  All questions were answered. The patient knows to call the clinic with any problems, questions or concerns. We can certainly see the patient much sooner if necessary.  Disclaimer: This note was dictated with voice recognition software. Similar sounding words can inadvertently be transcribed and may not be corrected upon review.

## 2014-02-23 ENCOUNTER — Ambulatory Visit (HOSPITAL_BASED_OUTPATIENT_CLINIC_OR_DEPARTMENT_OTHER): Payer: Medicare Other

## 2014-02-23 ENCOUNTER — Other Ambulatory Visit (HOSPITAL_BASED_OUTPATIENT_CLINIC_OR_DEPARTMENT_OTHER): Payer: Medicare Other

## 2014-02-23 VITALS — BP 146/49 | HR 59 | Temp 97.6°F | Resp 18

## 2014-02-23 DIAGNOSIS — C342 Malignant neoplasm of middle lobe, bronchus or lung: Secondary | ICD-10-CM

## 2014-02-23 DIAGNOSIS — C3490 Malignant neoplasm of unspecified part of unspecified bronchus or lung: Secondary | ICD-10-CM

## 2014-02-23 DIAGNOSIS — Z5111 Encounter for antineoplastic chemotherapy: Secondary | ICD-10-CM

## 2014-02-23 LAB — CBC WITH DIFFERENTIAL/PLATELET
BASO%: 1.4 % (ref 0.0–2.0)
Basophils Absolute: 0.1 10*3/uL (ref 0.0–0.1)
EOS%: 6.3 % (ref 0.0–7.0)
Eosinophils Absolute: 0.4 10*3/uL (ref 0.0–0.5)
HEMATOCRIT: 33.8 % — AB (ref 34.8–46.6)
HGB: 11.3 g/dL — ABNORMAL LOW (ref 11.6–15.9)
LYMPH%: 19.5 % (ref 14.0–49.7)
MCH: 31.3 pg (ref 25.1–34.0)
MCHC: 33.3 g/dL (ref 31.5–36.0)
MCV: 93.8 fL (ref 79.5–101.0)
MONO#: 0.6 10*3/uL (ref 0.1–0.9)
MONO%: 8.2 % (ref 0.0–14.0)
NEUT%: 64.6 % (ref 38.4–76.8)
NEUTROS ABS: 4.4 10*3/uL (ref 1.5–6.5)
PLATELETS: 302 10*3/uL (ref 145–400)
RBC: 3.6 10*6/uL — AB (ref 3.70–5.45)
RDW: 19.3 % — ABNORMAL HIGH (ref 11.2–14.5)
WBC: 6.8 10*3/uL (ref 3.9–10.3)
lymph#: 1.3 10*3/uL (ref 0.9–3.3)

## 2014-02-23 LAB — COMPREHENSIVE METABOLIC PANEL (CC13)
ALT: 22 U/L (ref 0–55)
ANION GAP: 11 meq/L (ref 3–11)
AST: 22 U/L (ref 5–34)
Albumin: 3.6 g/dL (ref 3.5–5.0)
Alkaline Phosphatase: 89 U/L (ref 40–150)
BUN: 26.7 mg/dL — ABNORMAL HIGH (ref 7.0–26.0)
CALCIUM: 10 mg/dL (ref 8.4–10.4)
CO2: 24 meq/L (ref 22–29)
Chloride: 105 mEq/L (ref 98–109)
Creatinine: 1.4 mg/dL — ABNORMAL HIGH (ref 0.6–1.1)
Glucose: 130 mg/dl (ref 70–140)
Potassium: 3.9 mEq/L (ref 3.5–5.1)
SODIUM: 141 meq/L (ref 136–145)
TOTAL PROTEIN: 6.7 g/dL (ref 6.4–8.3)
Total Bilirubin: 0.64 mg/dL (ref 0.20–1.20)

## 2014-02-23 MED ORDER — FAMOTIDINE IN NACL 20-0.9 MG/50ML-% IV SOLN
20.0000 mg | Freq: Once | INTRAVENOUS | Status: AC
  Administered 2014-02-23: 20 mg via INTRAVENOUS

## 2014-02-23 MED ORDER — ONDANSETRON 16 MG/50ML IVPB (CHCC)
16.0000 mg | Freq: Once | INTRAVENOUS | Status: AC
  Administered 2014-02-23: 16 mg via INTRAVENOUS

## 2014-02-23 MED ORDER — DEXAMETHASONE SODIUM PHOSPHATE 20 MG/5ML IJ SOLN
INTRAMUSCULAR | Status: AC
  Filled 2014-02-23: qty 5

## 2014-02-23 MED ORDER — DIPHENHYDRAMINE HCL 50 MG/ML IJ SOLN
50.0000 mg | Freq: Once | INTRAMUSCULAR | Status: AC
  Administered 2014-02-23: 50 mg via INTRAVENOUS

## 2014-02-23 MED ORDER — DEXAMETHASONE SODIUM PHOSPHATE 20 MG/5ML IJ SOLN
20.0000 mg | Freq: Once | INTRAMUSCULAR | Status: AC
  Administered 2014-02-23: 20 mg via INTRAVENOUS

## 2014-02-23 MED ORDER — ONDANSETRON 16 MG/50ML IVPB (CHCC)
INTRAVENOUS | Status: AC
  Filled 2014-02-23: qty 16

## 2014-02-23 MED ORDER — PACLITAXEL CHEMO INJECTION 300 MG/50ML
175.0000 mg/m2 | Freq: Once | INTRAVENOUS | Status: AC
  Administered 2014-02-23: 294 mg via INTRAVENOUS
  Filled 2014-02-23: qty 49

## 2014-02-23 MED ORDER — FAMOTIDINE IN NACL 20-0.9 MG/50ML-% IV SOLN
INTRAVENOUS | Status: AC
  Filled 2014-02-23: qty 50

## 2014-02-23 MED ORDER — SODIUM CHLORIDE 0.9 % IV SOLN
Freq: Once | INTRAVENOUS | Status: AC
  Administered 2014-02-23: 11:00:00 via INTRAVENOUS

## 2014-02-23 MED ORDER — SODIUM CHLORIDE 0.9 % IV SOLN
330.0000 mg | Freq: Once | INTRAVENOUS | Status: AC
  Administered 2014-02-23: 330 mg via INTRAVENOUS
  Filled 2014-02-23: qty 33

## 2014-02-23 MED ORDER — DIPHENHYDRAMINE HCL 50 MG/ML IJ SOLN
INTRAMUSCULAR | Status: AC
  Filled 2014-02-23: qty 1

## 2014-02-23 NOTE — Patient Instructions (Signed)
[facility] Discharge Instructions for Patients Receiving Chemotherapy  Today you received the following chemotherapy agents Taxol and Carboplatin.  To help prevent nausea and vomiting after your treatment, we encourage you to take your nausea medication.   If you develop nausea and vomiting that is not controlled by your nausea medication, call the clinic.   BELOW ARE SYMPTOMS THAT SHOULD BE REPORTED IMMEDIATELY:  *FEVER GREATER THAN 100.5 F  *CHILLS WITH OR WITHOUT FEVER  NAUSEA AND VOMITING THAT IS NOT CONTROLLED WITH YOUR NAUSEA MEDICATION  *UNUSUAL SHORTNESS OF BREATH  *UNUSUAL BRUISING OR BLEEDING  TENDERNESS IN MOUTH AND THROAT WITH OR WITHOUT PRESENCE OF ULCERS  *URINARY PROBLEMS  *BOWEL PROBLEMS  UNUSUAL RASH Items with * indicate a potential emergency and should be followed up as soon as possible.  Feel free to call the clinic you have any questions or concerns. The clinic phone number is [phone].    

## 2014-02-24 ENCOUNTER — Other Ambulatory Visit: Payer: Self-pay | Admitting: Internal Medicine

## 2014-02-24 ENCOUNTER — Telehealth: Payer: Self-pay | Admitting: *Deleted

## 2014-02-24 ENCOUNTER — Ambulatory Visit (HOSPITAL_BASED_OUTPATIENT_CLINIC_OR_DEPARTMENT_OTHER): Payer: Medicare Other

## 2014-02-24 ENCOUNTER — Ambulatory Visit: Payer: Medicare Other

## 2014-02-24 VITALS — BP 133/70 | HR 68 | Temp 98.2°F

## 2014-02-24 DIAGNOSIS — C342 Malignant neoplasm of middle lobe, bronchus or lung: Secondary | ICD-10-CM

## 2014-02-24 DIAGNOSIS — Z5189 Encounter for other specified aftercare: Secondary | ICD-10-CM

## 2014-02-24 MED ORDER — PEGFILGRASTIM INJECTION 6 MG/0.6ML
6.0000 mg | Freq: Once | SUBCUTANEOUS | Status: AC
  Administered 2014-02-24: 6 mg via SUBCUTANEOUS
  Filled 2014-02-24: qty 0.6

## 2014-02-24 NOTE — Telephone Encounter (Signed)
Kimberly [name] here for Neulasta injection following 1st taxol/carbo chemo treatment.  States that she is doing well.  Slight nausea which is relieved with the antiemetics.   No vomiting or diarrhea.  Is drinking some.  Encouraged her to drink more fluids.  All questions answered.  Knows to call if she has any problems or concerns.

## 2014-02-24 NOTE — Patient Instructions (Signed)

## 2014-02-25 ENCOUNTER — Encounter: Payer: Self-pay | Admitting: Cardiology

## 2014-02-25 ENCOUNTER — Ambulatory Visit (INDEPENDENT_AMBULATORY_CARE_PROVIDER_SITE_OTHER): Payer: Medicare Other | Admitting: Cardiology

## 2014-02-25 VITALS — BP 139/69 | HR 67 | Ht 59.0 in | Wt 149.0 lb

## 2014-02-25 DIAGNOSIS — C342 Malignant neoplasm of middle lobe, bronchus or lung: Secondary | ICD-10-CM

## 2014-02-25 DIAGNOSIS — I10 Essential (primary) hypertension: Secondary | ICD-10-CM

## 2014-02-25 DIAGNOSIS — I4891 Unspecified atrial fibrillation: Secondary | ICD-10-CM

## 2014-02-25 MED ORDER — AMIODARONE HCL 200 MG PO TABS: 400.0000 mg | ORAL_TABLET | Freq: Every day | ORAL | Status: DC

## 2014-02-25 NOTE — Progress Notes (Signed)
03/01/2014   PCP: [name], MD   Chief Complaint  Patient presents with  . Follow-up    S/P hospital visit    Primary Cardiologist:Dr. Alma Friendly   HPI:  73 year old female with a history of nonresectable squamous cell lung cancer who is on chemotherapy and radiation therapy since January 2015.  She was hospitalized 02/07/2014 through 02/09/2014 with atrial fibrillation rapid ventricular response.  She was hypotensive as well. She was admitted to the hospital and started on amiodarone though she converted to SR before amiodarone was started.  Anticoagulation was not added due to brief episode of A. fib and concern for bleeding with lung cancer and possible upcoming surgery. She was started on aspirin. She did have an elevated troponin during the hospitalization non-ST elevation MI2 secondary to demand ischemia in the setting of anemia and A. fib with RVR. Echocardiogram revealed an EF of 60-65% with mild LVH no pericardial effusion.  At some point she would benefit from a LexiScan myoview to further evaluate for cardiac ischemia.  Today patient denies chest pain no change in her chronic shortness of breath. She was to have decreased her amiodarone as an outpatient but she continues on amiodarone 400 mg twice a day.  Her QTC is mildly inflamed and we will go ahead and decrease her amiodarone today.   She is to undergo a series of 3 treatments of chemotherapy in the near future.      Allergies  Allergen Reactions  . Milk-Related Compounds Other (See Comments)    REACTION: GI upset, projectile vomiting    Current Outpatient Prescriptions  Medication Sig Dispense Refill  . acetaminophen (TYLENOL) 500 MG tablet Take 500 mg by mouth every 6 (six) hours as needed for mild pain.       [name] aspirin EC 325 MG tablet Take 1 tablet (325 mg total) by mouth daily.  30 tablet  0  . atorvastatin (LIPITOR) 20 MG tablet Take 20 mg by mouth daily.      [name] docusate sodium  (COLACE) 100 MG capsule Take 100 mg by mouth 2 (two) times daily as needed for mild constipation or moderate constipation.       [name] EPINEPHrine (EPIPEN IJ) Inject 1 each as directed once as needed (for reaction to milk products).      [name] levothyroxine (SYNTHROID, LEVOTHROID) 125 MCG tablet Take 125 mcg by mouth daily before breakfast.      . loratadine (CLARITIN) 10 MG tablet Take 10 mg by mouth daily as needed for allergies.      . metoprolol succinate (TOPROL-XL) 25 MG 24 hr tablet Take 12.5 mg by mouth daily.      . montelukast (SINGULAIR) 10 MG tablet Take 10 mg by mouth daily.      . pantoprazole (PROTONIX) 40 MG tablet Take 40 mg by mouth 2 (two) times daily.       . ranitidine (ZANTAC) 300 MG tablet Take 300 mg by mouth at bedtime.      . traMADol (ULTRAM) 50 MG tablet Take 25 mg by mouth every 6 (six) hours as needed for moderate pain.      [name] albuterol (PROVENTIL HFA;VENTOLIN HFA) 108 (90 BASE) MCG/ACT inhaler Inhale 1 puff into the lungs every 6 (six) hours as needed for wheezing or shortness of breath.      [name] amiodarone (PACERONE) 200 MG tablet Take 2 tablets (400 mg total) by mouth daily. For one month then 200  mg daily  120 tablet  3  . ibuprofen (ADVIL,MOTRIN) 200 MG tablet Take 200 mg by mouth every 6 (six) hours as needed for moderate pain.      [name] losartan (COZAAR) 25 MG tablet Take 1 tablet (25 mg total) by mouth daily.  30 tablet  0  . prochlorperazine (COMPAZINE) 10 MG tablet Take 10 mg by mouth every 6 (six) hours as needed for nausea or vomiting.       No current facility-administered medications for this visit.    Past Medical History  Diagnosis Date  . Hypertension   . Thyroid disease   . GERD (gastroesophageal reflux disease)   . Dyslipidemia   . Aortic insufficiency   . Mitral insufficiency   . History of nuclear stress test 02/26/2006    exercise myoview; normal pattern of perfusion; low risk scan   . PONV (postoperative nausea and vomiting)   . Heart murmur   .  Hypothyroidism   . Cough     dry, endobronchial mass  . Pneumonia     pus bronchitis  . Arthritis   . Syncope     "states she has passed out a few times. dr is trying to find cause.last time when geeting ready to go home after video bronch/bx  . Shortness of breath     with exertion  . Anxiety     due to surgery   . Bronchitis   . Radiation 12/01/13-01/08/14    50.4 gray to right central chest  . Atrial fibrillation   . Squamous cell carcinoma of lung     Past Surgical History  Procedure Laterality Date  . Thyroidectomy  1973  . Dilation and curettage of uterus    . Carpal tunnel release Right 1988  . Rotator cuff repair  2006    ? side  . H/o met test w/pft  04/02/2012    low risk; peak VO2 77% predicted  . Cardiac catheterization  07/08/2008    normal coronaries  . Transthoracic echocardiogram  09/25/2012    EF 55-60%; mild LVH & mild concentric hypertrophy; mild AV regurg; RV systolic pressure increase consistent with mild pulm HTN  . Video bronchoscopy Bilateral 09/25/2013    Procedure: VIDEO BRONCHOSCOPY WITHOUT FLUORO;  Surgeon: Tanda Rockers, MD;  Location: [facility];  Service: Cardiopulmonary;  Laterality: Bilateral;  . Joint replacement  2003    thumb rt  . Knee arthroscopy  12    rt meniscus  . Video bronchoscopy with endobronchial ultrasound N/A 10/30/2013    Procedure: VIDEO BRONCHOSCOPY WITH ENDOBRONCHIAL ULTRASOUND;  Surgeon: Melrose Nakayama, MD;  Location: [place];  Service: Thoracic;  Laterality: N/A;  . Mediastinoscopy N/A 10/30/2013    Procedure: MEDIASTINOSCOPY;  Surgeon: Melrose Nakayama, MD;  Location: [place];  Service: Thoracic;  Laterality: N/A;  . Eye surgery Bilateral   . Video assisted thoracoscopy (vats)/ lobectomy Right 11/13/2013    Procedure: VIDEO ASSISTED THORACOSCOPY (VATS) with mediastinal  biopsies;  Surgeon: Melrose Nakayama, MD;  Location: [place];  Service: Thoracic;  Laterality: Right;  RIGHT VATS,mediastinal biopsies     NIO:EVOJJKK:XF colds or fevers, no weight changes Skin:no rashes or ulcers HEENT:no blurred vision, no congestion CV:see HPI PUL:see HPI GI:no diarrhea constipation or melena, no indigestion GU:no hematuria, no dysuria MS:no joint pain, no claudication Neuro:no syncope, no lightheadedness Endo:no diabetes, + thyroid disease-controlled  Wt Readings from Last 3 Encounters:  02/27/14 145 lb (65.772 kg)  02/25/14 149 lb (67.586 kg)  02/16/14 151 lb 6.4 oz (68.675 kg)    PHYSICAL EXAM BP 139/69  Pulse 67  Ht _0  (1.499 m)  Wt 149 lb (67.586 kg)  BMI 30.08 kg/m2 General:Pleasant affect, NAD Skin:Warm and dry, brisk capillary refill HEENT:normocephalic, sclera clear, mucus membranes moist Neck:supple, no JVD, no bruits  Heart:S1S2 RRR without murmur, gallup, rub or click Lungs:clear without rales, rhonchi, or wheezes VQX:IHWT, non tender, + BS, do not palpate liver spleen or masses Ext:no lower ext edema, 2+ pedal pulses, 2+ radial pulses Neuro:alert and oriented, MAE, follows commands, + facial symmetry  EKG:SR Qtc 492 other wise normal EKG  ASSESSMENT AND PLAN Atrial fibrillation with rapid ventricular response Spontaneous conversion in the hospital to sinus rhythm and has maintained sinus rhythm. I am decreasing her amiodarone to 400 mg once day for one month and then 200 mg daily. She'll follow with Dr. Debara Pickett in several weeks at that point she will need a TSH evaluated. Recent hepatic panel was normal.  Her QTC is mildly prolonged but with decreasing amiodarone this should improve.  HYPERTENSION Controlled  Lung cancer, middle lobe Followed by Dr. Earlie Server

## 2014-02-25 NOTE — Patient Instructions (Signed)
You will need TSH check for your thyroid in 4-6 weeks Dr. Debara Pickett will order.  Decrease Amiodarone to 400 mg daily for 1 month then decrease to 200 mg daily.  Call if fast heart rate, go to ER for chest pain.  Follow up with Dr. Debara Pickett in 4-5 weeks.

## 2014-02-26 ENCOUNTER — Telehealth: Payer: Self-pay | Admitting: Internal Medicine

## 2014-02-26 NOTE — Telephone Encounter (Signed)
Contacted by nurse triage line.  Pt having diffuse aches not relieved by tylenol after Neulasta.  Advised it would be OK to use ibuprofen sparingly and to take a Claritin daily.  Loma Newton, MD

## 2014-02-27 ENCOUNTER — Encounter: Payer: Self-pay | Admitting: Internal Medicine

## 2014-02-27 ENCOUNTER — Other Ambulatory Visit: Payer: Self-pay

## 2014-02-27 ENCOUNTER — Emergency Department (HOSPITAL_COMMUNITY): Payer: Medicare Other

## 2014-02-27 ENCOUNTER — Encounter (HOSPITAL_COMMUNITY): Payer: Self-pay | Admitting: Emergency Medicine

## 2014-02-27 ENCOUNTER — Inpatient Hospital Stay (HOSPITAL_COMMUNITY)
Admission: EM | Admit: 2014-02-27 | Discharge: 2014-02-28 | DRG: 312 | Disposition: A | Discharge status: Home / Self Care | Payer: Medicare Other | Attending: Internal Medicine | Admitting: Internal Medicine

## 2014-02-27 DIAGNOSIS — R197 Diarrhea, unspecified: Secondary | ICD-10-CM | POA: Diagnosis present

## 2014-02-27 DIAGNOSIS — D649 Anemia, unspecified: Secondary | ICD-10-CM | POA: Diagnosis present

## 2014-02-27 DIAGNOSIS — Z8701 Personal history of pneumonia (recurrent): Secondary | ICD-10-CM

## 2014-02-27 DIAGNOSIS — I480 Paroxysmal atrial fibrillation: Secondary | ICD-10-CM

## 2014-02-27 DIAGNOSIS — Z87891 Personal history of nicotine dependence: Secondary | ICD-10-CM | POA: Diagnosis not present

## 2014-02-27 DIAGNOSIS — C3490 Malignant neoplasm of unspecified part of unspecified bronchus or lung: Secondary | ICD-10-CM | POA: Diagnosis present

## 2014-02-27 DIAGNOSIS — R55 Syncope and collapse: Secondary | ICD-10-CM | POA: Diagnosis not present

## 2014-02-27 DIAGNOSIS — Z8249 Family history of ischemic heart disease and other diseases of the circulatory system: Secondary | ICD-10-CM

## 2014-02-27 DIAGNOSIS — Z7982 Long term (current) use of aspirin: Secondary | ICD-10-CM | POA: Diagnosis not present

## 2014-02-27 DIAGNOSIS — Z9221 Personal history of antineoplastic chemotherapy: Secondary | ICD-10-CM | POA: Diagnosis not present

## 2014-02-27 DIAGNOSIS — E785 Hyperlipidemia, unspecified: Secondary | ICD-10-CM | POA: Diagnosis present

## 2014-02-27 DIAGNOSIS — E039 Hypothyroidism, unspecified: Secondary | ICD-10-CM | POA: Diagnosis present

## 2014-02-27 DIAGNOSIS — I4891 Unspecified atrial fibrillation: Secondary | ICD-10-CM | POA: Diagnosis not present

## 2014-02-27 DIAGNOSIS — E860 Dehydration: Secondary | ICD-10-CM

## 2014-02-27 DIAGNOSIS — Z801 Family history of malignant neoplasm of trachea, bronchus and lung: Secondary | ICD-10-CM

## 2014-02-27 DIAGNOSIS — I10 Essential (primary) hypertension: Secondary | ICD-10-CM | POA: Diagnosis present

## 2014-02-27 DIAGNOSIS — R110 Nausea: Secondary | ICD-10-CM

## 2014-02-27 DIAGNOSIS — I951 Orthostatic hypotension: Secondary | ICD-10-CM | POA: Diagnosis present

## 2014-02-27 DIAGNOSIS — N289 Disorder of kidney and ureter, unspecified: Secondary | ICD-10-CM | POA: Diagnosis present

## 2014-02-27 DIAGNOSIS — R112 Nausea with vomiting, unspecified: Secondary | ICD-10-CM | POA: Diagnosis present

## 2014-02-27 DIAGNOSIS — C342 Malignant neoplasm of middle lobe, bronchus or lung: Secondary | ICD-10-CM | POA: Diagnosis present

## 2014-02-27 DIAGNOSIS — K219 Gastro-esophageal reflux disease without esophagitis: Secondary | ICD-10-CM | POA: Diagnosis present

## 2014-02-27 DIAGNOSIS — Z79899 Other long term (current) drug therapy: Secondary | ICD-10-CM

## 2014-02-27 DIAGNOSIS — E871 Hypo-osmolality and hyponatremia: Secondary | ICD-10-CM

## 2014-02-27 DIAGNOSIS — M129 Arthropathy, unspecified: Secondary | ICD-10-CM | POA: Diagnosis present

## 2014-02-27 DIAGNOSIS — I482 Chronic atrial fibrillation, unspecified: Secondary | ICD-10-CM

## 2014-02-27 HISTORY — DX: Unspecified atrial fibrillation: I48.91

## 2014-02-27 HISTORY — DX: Malignant neoplasm of unspecified part of unspecified bronchus or lung: C34.90

## 2014-02-27 LAB — URINALYSIS, ROUTINE W REFLEX MICROSCOPIC
BILIRUBIN URINE: NEGATIVE
Glucose, UA: NEGATIVE mg/dL
Hgb urine dipstick: NEGATIVE
Ketones, ur: NEGATIVE mg/dL
Leukocytes, UA: NEGATIVE
NITRITE: NEGATIVE
Protein, ur: NEGATIVE mg/dL
Specific Gravity, Urine: 1.012 (ref 1.005–1.030)
UROBILINOGEN UA: 0.2 mg/dL (ref 0.0–1.0)
pH: 5.5 (ref 5.0–8.0)

## 2014-02-27 LAB — TROPONIN I: Troponin I: <0.3 ng/mL (ref <0.30)

## 2014-02-27 LAB — COMPREHENSIVE METABOLIC PANEL
ALT: 29 U/L (ref 0–35)
AST: 31 U/L (ref 0–37)
Albumin: 3.6 g/dL (ref 3.5–5.2)
Alkaline Phosphatase: 101 U/L (ref 39–117)
BILIRUBIN TOTAL: 1 mg/dL (ref 0.3–1.2)
BUN: 24 mg/dL — AB (ref 6–23)
CHLORIDE: 88 meq/L — AB (ref 96–112)
CO2: 22 meq/L (ref 19–32)
CREATININE: 1.11 mg/dL — AB (ref 0.50–1.10)
Calcium: 9.2 mg/dL (ref 8.4–10.5)
GFR calc non Af Amer: 49 mL/min — ABNORMAL LOW (ref >90)
GFR, EST AFRICAN AMERICAN: 57 mL/min — AB (ref >90)
Glucose, Bld: 132 mg/dL — ABNORMAL HIGH (ref 70–99)
Potassium: 3.7 mEq/L (ref 3.7–5.3)
Sodium: 126 mEq/L — ABNORMAL LOW (ref 137–147)
Total Protein: 6.7 g/dL (ref 6.0–8.3)

## 2014-02-27 LAB — GI PATHOGEN PANEL BY PCR, STOOL
C difficile toxin A/B: NEGATIVE
Campylobacter by PCR: NEGATIVE
Cryptosporidium by PCR: NEGATIVE
E COLI (STEC): NEGATIVE
E coli (ETEC) LT/ST: NEGATIVE
E coli 0157 by PCR: NEGATIVE
G lamblia by PCR: NEGATIVE
NOROVIRUS G1/G2: NEGATIVE
Rotavirus A by PCR: NEGATIVE
SALMONELLA BY PCR: NEGATIVE
Shigella by PCR: NEGATIVE

## 2014-02-27 LAB — CBC WITH DIFFERENTIAL/PLATELET
BASOS ABS: 0.1 10*3/uL (ref 0.0–0.1)
Basophils Relative: 1 % (ref 0–1)
EOS PCT: 1 % (ref 0–5)
Eosinophils Absolute: 0.1 10*3/uL (ref 0.0–0.7)
HEMATOCRIT: 31.7 % — AB (ref 36.0–46.0)
Hemoglobin: 11 g/dL — ABNORMAL LOW (ref 12.0–15.0)
Lymphocytes Relative: 6 % — ABNORMAL LOW (ref 12–46)
Lymphs Abs: 0.5 10*3/uL — ABNORMAL LOW (ref 0.7–4.0)
MCH: 31.1 pg (ref 26.0–34.0)
MCHC: 34.7 g/dL (ref 30.0–36.0)
MCV: 89.5 fL (ref 78.0–100.0)
Monocytes Absolute: 0.2 10*3/uL (ref 0.1–1.0)
Monocytes Relative: 2 % — ABNORMAL LOW (ref 3–12)
NEUTROS ABS: 8.1 10*3/uL — AB (ref 1.7–7.7)
Neutrophils Relative %: 90 % — ABNORMAL HIGH (ref 43–77)
Platelets: 168 10*3/uL (ref 150–400)
RBC: 3.54 MIL/uL — ABNORMAL LOW (ref 3.87–5.11)
RDW: 16.6 % — AB (ref 11.5–15.5)
WBC: 9 10*3/uL (ref 4.0–10.5)

## 2014-02-27 LAB — POC OCCULT BLOOD, ED: Fecal Occult Bld: POSITIVE — AB

## 2014-02-27 LAB — LIPASE, BLOOD: LIPASE: 21 U/L (ref 11–59)

## 2014-02-27 LAB — LACTIC ACID, PLASMA: Lactic Acid, Venous: 2.3 mmol/L — ABNORMAL HIGH (ref 0.5–2.2)

## 2014-02-27 MED ORDER — ATORVASTATIN CALCIUM 20 MG PO TABS
20.0000 mg | ORAL_TABLET | Freq: Every day | ORAL | Status: DC
  Filled 2014-02-27: qty 1

## 2014-02-27 MED ORDER — LORATADINE 10 MG PO TABS
10.0000 mg | ORAL_TABLET | Freq: Every day | ORAL | Status: DC | PRN
  Filled 2014-02-27: qty 1

## 2014-02-27 MED ORDER — PROCHLORPERAZINE MALEATE 10 MG PO TABS
10.0000 mg | ORAL_TABLET | Freq: Four times a day (QID) | ORAL | Status: DC | PRN
  Filled 2014-02-27: qty 1

## 2014-02-27 MED ORDER — FAMOTIDINE 20 MG PO TABS
20.0000 mg | ORAL_TABLET | Freq: Two times a day (BID) | ORAL | Status: DC
Start: 2014-02-27 — End: 2014-02-28
  Administered 2014-02-27 – 2014-02-28 (×3): 20 mg via ORAL
  Filled 2014-02-27 (×4): qty 1

## 2014-02-27 MED ORDER — MONTELUKAST SODIUM 10 MG PO TABS
10.0000 mg | ORAL_TABLET | Freq: Every day | ORAL | Status: DC
  Administered 2014-02-28: 10 mg via ORAL
  Filled 2014-02-27: qty 1

## 2014-02-27 MED ORDER — SODIUM CHLORIDE 0.9 % IV SOLN
INTRAVENOUS | Status: DC
Start: 2014-02-27 — End: 2014-02-28
  Administered 2014-02-28: 09:00:00 via INTRAVENOUS

## 2014-02-27 MED ORDER — SODIUM CHLORIDE 0.9 % IV SOLN
INTRAVENOUS | Status: DC
  Administered 2014-02-27 (×2): via INTRAVENOUS

## 2014-02-27 MED ORDER — ALBUTEROL SULFATE (2.5 MG/3ML) 0.083% IN NEBU: 2.5000 mg | INHALATION_SOLUTION | Freq: Four times a day (QID) | RESPIRATORY_TRACT | Status: DC | PRN

## 2014-02-27 MED ORDER — TRAMADOL HCL 50 MG PO TABS: 25.0000 mg | ORAL_TABLET | Freq: Four times a day (QID) | ORAL | Status: DC | PRN

## 2014-02-27 MED ORDER — SODIUM CHLORIDE 0.9 % IV SOLN
INTRAVENOUS | Status: AC
  Administered 2014-02-27: 13:00:00 via INTRAVENOUS

## 2014-02-27 MED ORDER — ACETAMINOPHEN 500 MG PO TABS: 500.0000 mg | ORAL_TABLET | Freq: Four times a day (QID) | ORAL | Status: DC | PRN

## 2014-02-27 MED ORDER — ONDANSETRON HCL 4 MG/2ML IJ SOLN: 4.0000 mg | Freq: Three times a day (TID) | INTRAMUSCULAR | Status: AC | PRN

## 2014-02-27 MED ORDER — LEVOTHYROXINE SODIUM 125 MCG PO TABS
125.0000 ug | ORAL_TABLET | Freq: Every day | ORAL | Status: DC
  Administered 2014-02-28: 125 ug via ORAL
  Filled 2014-02-27 (×2): qty 1
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
## 2014-02-27 NOTE — Progress Notes (Signed)
INITIAL NUTRITION ASSESSMENT  DOCUMENTATION CODES Per approved criteria  -Not Applicable   INTERVENTION: -Recommend snacks BID -Reviewed nutrition therapy for increasing kcal/protein and for chemo related taste changes -Encouraged PO intake -Will continue to monitor  NUTRITION DIAGNOSIS: Unintentional wt loss related to chronic disease as evidenced by 40 lbs wt loss in 6 months, lung cancer  Goal: Pt to meet >/= 90% of their estimated nutrition needs    Monitor:  Total protein/energy intake, labs, weights,   Reason for Assessment: MST  73 y.o. female  Admitting Dx: <principal problem not specified>  ASSESSMENT: Very pleasant 73 year old female with a history of nonresectable squamous cell lung cancer who is on chemotherapy and radiation therapy since January 2015. Last evening she began noticing midsternal parotic chest pain  -Pt reported an intentional and unintentional wt loss of 40 lbs in 6 months. Pt had been trying to lose weight with diet modifications, and adopting more plant based diet. -Diet recall indicates pt consuming three balanced meals/day. Cannot tolerate milk or milk products. Consumes eggs, quinoa, beans, chicken for protein. Was interested in learning additional snack/meal ideas for more protein options. Discussed increasing intake of protein with snacks-encouraged peanut butter, almonds, trail mixes -Endorsed taste changes, metallic taste, and occassional nausea-discussed ways to improve tastes and for alleviate nausea -Will order snacks BID for additional nutrients. Relayed to RN  Height: Ht Readings from Last 1 Encounters:  02/27/14 4' 11" (1.499 m)    Weight: Wt Readings from Last 1 Encounters:  02/27/14 145 lb (65.772 kg)    Ideal Body Weight: 97.5 lbs  % Ideal Body Weight: 148 lbs  Wt Readings from Last 10 Encounters:  02/27/14 145 lb (65.772 kg)  02/25/14 149 lb (67.586 kg)  02/16/14 151 lb 6.4 oz (68.675 kg)  02/12/14 157 lb (71.215 kg)   02/09/14 155 lb 8 oz (70.534 kg)  01/12/14 156 lb 8 oz (70.988 kg)  01/06/14 159 lb (72.122 kg)  01/05/14 156 lb 1.6 oz (70.806 kg)  12/30/13 160 lb 14.4 oz (72.984 kg)  12/23/13 164 lb 9.6 oz (74.662 kg)    Usual Body Weight: 180 lbs, 6 months ago  % Usual Body Weight: 78%  BMI:  Body mass index is 29.27 kg/(m^2). Overweight  Estimated Nutritional Needs: Kcal: 1800-2000 Protein: 80-90 gram Fluid: >/=1800 ml/daily  Skin: WDL  Diet Order: General  EDUCATION NEEDS: -Education needs addressed  No intake or output data in the 24 hours ending 02/27/14 1617  Last BM: pta   Labs:   Recent Labs Lab 02/23/14 1008 02/27/14 1029  NA 141 126*  K 3.9 3.7  CL  --  88*  CO2 24 22  BUN 26.7* 24*  CREATININE 1.4* 1.11*  CALCIUM 10.0 9.2  GLUCOSE 130 132*    CBG (last 3)  No results found for this basename: GLUCAP,  in the last 72 hours  Scheduled Meds: . sodium chloride   Intravenous STAT  . [START ON 02/28/2014] atorvastatin  20 mg Oral q1800  . famotidine  20 mg Oral BID  . [START ON 02/28/2014] levothyroxine  125 mcg Oral QAC breakfast  . [START ON 02/28/2014] montelukast  10 mg Oral Daily    Continuous Infusions: . sodium chloride 75 mL/hr at 02/27/14 1100  . sodium chloride      Past Medical History  Diagnosis Date  . Hypertension   . Thyroid disease   . GERD (gastroesophageal reflux disease)   . Dyslipidemia   . Aortic insufficiency   .  Mitral insufficiency   . History of nuclear stress test 02/26/2006    exercise myoview; normal pattern of perfusion; low risk scan   . PONV (postoperative nausea and vomiting)   . Heart murmur   . Hypothyroidism   . Cough     dry, endobronchial mass  . Pneumonia     pus bronchitis  . Arthritis   . Syncope     "states she has passed out a few times. dr is trying to find cause.last time when geeting ready to go home after video bronch/bx  . Shortness of breath     with exertion  . Anxiety     due to surgery   .  Bronchitis   . Radiation 12/01/13-01/08/14    50.4 gray to right central chest  . Atrial fibrillation   . Squamous cell carcinoma of lung     Past Surgical History  Procedure Laterality Date  . Thyroidectomy  1973  . Dilation and curettage of uterus    . Carpal tunnel release Right 1988  . Rotator cuff repair  2006    ? side  . H/o met test w/pft  04/02/2012    low risk; peak VO2 77% predicted  . Cardiac catheterization  07/08/2008    normal coronaries  . Transthoracic echocardiogram  09/25/2012    EF 55-60%; mild LVH & mild concentric hypertrophy; mild AV regurg; RV systolic pressure increase consistent with mild pulm HTN  . Video bronchoscopy Bilateral 09/25/2013    Procedure: VIDEO BRONCHOSCOPY WITHOUT FLUORO;  Surgeon: Michael B Wert, MD;  Location: [facility];  Service: Cardiopulmonary;  Laterality: Bilateral;  . Joint replacement  2003    thumb rt  . Knee arthroscopy  12    rt meniscus  . Video bronchoscopy with endobronchial ultrasound N/A 10/30/2013    Procedure: VIDEO BRONCHOSCOPY WITH ENDOBRONCHIAL ULTRASOUND;  Surgeon: Steven C Hendrickson, MD;  Location: [facility];  Service: Thoracic;  Laterality: N/A;  . Mediastinoscopy N/A 10/30/2013    Procedure: MEDIASTINOSCOPY;  Surgeon: Steven C Hendrickson, MD;  Location: [facility];  Service: Thoracic;  Laterality: N/A;  . Eye surgery Bilateral   . Video assisted thoracoscopy (vats)/ lobectomy Right 11/13/2013    Procedure: VIDEO ASSISTED THORACOSCOPY (VATS) with mediastinal  biopsies;  Surgeon: Steven C Hendrickson, MD;  Location: [facility];  Service: Thoracic;  Laterality: Right;  RIGHT VATS,mediastinal biopsies    Sarah F Beaty MS RD LDN Clinical Dietitian Pager:[phone]   

## 2014-02-27 NOTE — ED Notes (Signed)
Patient transported to CT 

## 2014-02-27 NOTE — ED Notes (Signed)
Per EMS. Pt from home, was sitting on porch when she stood up and had syncopal episode. Daughter caught pt. Denied any new pain, took tramadol this am for leg pain. Pt has hx of lung ca, last chemo Monday, recently had chemo regimen changed. Pt has been having n/v/d with treatments, has been able to keep down PO fluids. Some nausea this am, but no vomiting.

## 2014-02-27 NOTE — ED Notes (Signed)
Will wait on fluids to run some and try getting urine again

## 2014-02-27 NOTE — ED Notes (Signed)
Patient will call when she has to urinate again. Bed Side toilet is in the room

## 2014-02-27 NOTE — ED Notes (Signed)
Patient placed on bedside commode to obtain urine sample. Patient accidentally defecated whilst urinating. Sample contaminated. Will re-attempt urine sample. IV fluids administered.

## 2014-02-27 NOTE — ED Notes (Signed)
Pt states unable to void at this time, aware of need for urine specimen.

## 2014-02-27 NOTE — H&P (Addendum)
[facility] History and Physical  Kimberly [name] LPF:[id number] DOB: 1941/01/21 DOA: 02/27/2014  Referring physician: EDP PCP: [name], MD   Chief Complaint: syncope around 9 am.   HPI: Kimberly [name] is a 73 y.o. female with prior h/o hypertension, Hypothyroidism, atrial fibrillation, lung cancer s/p chemo last Monday, comes in for an episode of syncope witnessed by family. Pt stood up from sitting position and passed out, family close by were able to catch her from falling to the ground, . Patient does not remember the events. She reports typical events after chemo, like nausea, vomiting and diarrhea, watery. She denies any other complaints. On arrival to ED, her orthostatics were positive. She was also found to have mild renal insufficiency, hyponatremic. She was referred to medical service for admission.    Review of Systems:  SEE HPI otherwise negative.   Past Medical History  Diagnosis Date  . Hypertension   . Thyroid disease   . GERD (gastroesophageal reflux disease)   . Dyslipidemia   . Aortic insufficiency   . Mitral insufficiency   . History of nuclear stress test 02/26/2006    exercise myoview; normal pattern of perfusion; low risk scan   . PONV (postoperative nausea and vomiting)   . Heart murmur   . Hypothyroidism   . Cough     dry, endobronchial mass  . Pneumonia     pus bronchitis  . Arthritis   . Syncope     "states she has passed out a few times. dr is trying to find cause.last time when geeting ready to go home after video bronch/bx  . Shortness of breath     with exertion  . Anxiety     due to surgery   . Bronchitis   . Radiation 12/01/13-01/08/14    50.4 gray to right central chest  . Atrial fibrillation   . Squamous cell carcinoma of lung    Past Surgical History  Procedure Laterality Date  . Thyroidectomy  1973  . Dilation and curettage of uterus    . Carpal tunnel release Right 1988  . Rotator cuff repair  2006    ? side    . H/o met test w/pft  04/02/2012    low risk; peak VO2 77% predicted  . Cardiac catheterization  07/08/2008    normal coronaries  . Transthoracic echocardiogram  09/25/2012    EF 55-60%; mild LVH & mild concentric hypertrophy; mild AV regurg; RV systolic pressure increase consistent with mild pulm HTN  . Video bronchoscopy Bilateral 09/25/2013    Procedure: VIDEO BRONCHOSCOPY WITHOUT FLUORO;  Surgeon: Tanda Rockers, MD;  Location: [facility];  Service: Cardiopulmonary;  Laterality: Bilateral;  . Joint replacement  2003    thumb rt  . Knee arthroscopy  12    rt meniscus  . Video bronchoscopy with endobronchial ultrasound N/A 10/30/2013    Procedure: VIDEO BRONCHOSCOPY WITH ENDOBRONCHIAL ULTRASOUND;  Surgeon: Melrose Nakayama, MD;  Location: [facility];  Service: Thoracic;  Laterality: N/A;  . Mediastinoscopy N/A 10/30/2013    Procedure: MEDIASTINOSCOPY;  Surgeon: Melrose Nakayama, MD;  Location: [facility];  Service: Thoracic;  Laterality: N/A;  . Eye surgery Bilateral   . Video assisted thoracoscopy (vats)/ lobectomy Right 11/13/2013    Procedure: VIDEO ASSISTED THORACOSCOPY (VATS) with mediastinal  biopsies;  Surgeon: Melrose Nakayama, MD;  Location: [facility];  Service: Thoracic;  Laterality: Right;  RIGHT VATS,mediastinal biopsies   Social History:  reports that she quit smoking about  46 years ago. Her smoking use included Cigarettes. She has a 3 pack-year smoking history. She has never used smokeless tobacco. She reports that she drinks alcohol. She reports that she does not use illicit drugs.  Allergies  Allergen Reactions  . Milk-Related Compounds Other (See Comments)    REACTION: GI upset, projectile vomiting    Family History  Problem Relation Age of Onset  . Lung cancer Mother   . Heart attack Father   . Heart failure Maternal Grandfather   . Heart attack Paternal Grandfather      Prior to Admission medications   Medication Sig Start Date End Date Taking? Authorizing  Provider  acetaminophen (TYLENOL) 500 MG tablet Take 500 mg by mouth every 6 (six) hours as needed for mild pain.    Yes Historical Provider, MD  albuterol (PROVENTIL HFA;VENTOLIN HFA) 108 (90 BASE) MCG/ACT inhaler Inhale 1 puff into the lungs every 6 (six) hours as needed for wheezing or shortness of breath.   Yes Historical Provider, MD  amiodarone (PACERONE) 200 MG tablet Take 2 tablets (400 mg total) by mouth daily. For one month then 200 mg daily 02/25/14  Yes Cecilie Kicks, NP  aspirin EC 325 MG tablet Take 1 tablet (325 mg total) by mouth daily. 02/09/14  Yes Tarri Fuller, PA-C  atorvastatin (LIPITOR) 20 MG tablet Take 20 mg by mouth daily.   Yes Historical Provider, MD  docusate sodium (COLACE) 100 MG capsule Take 100 mg by mouth 2 (two) times daily as needed for mild constipation or moderate constipation.    Yes Historical Provider, MD  EPINEPHrine (EPIPEN IJ) Inject 1 each as directed once as needed (for reaction to milk products).   Yes Historical Provider, MD  ibuprofen (ADVIL,MOTRIN) 200 MG tablet Take 200 mg by mouth every 6 (six) hours as needed for moderate pain.   Yes Historical Provider, MD  irbesartan-hydrochlorothiazide (AVALIDE) 150-12.5 MG per tablet Take 1 tablet by mouth daily. 02/12/14  Yes Pixie Casino, MD  levothyroxine (SYNTHROID, LEVOTHROID) 125 MCG tablet Take 125 mcg by mouth daily before breakfast.   Yes Historical Provider, MD  loratadine (CLARITIN) 10 MG tablet Take 10 mg by mouth daily as needed for allergies.   Yes Historical Provider, MD  metoprolol succinate (TOPROL-XL) 25 MG 24 hr tablet Take 12.5 mg by mouth daily.   Yes Historical Provider, MD  montelukast (SINGULAIR) 10 MG tablet Take 10 mg by mouth daily. 04/15/13  Yes Historical Provider, MD  pantoprazole (PROTONIX) 40 MG tablet Take 40 mg by mouth 2 (two) times daily.    Yes Historical Provider, MD  prochlorperazine (COMPAZINE) 10 MG tablet Take 10 mg by mouth every 6 (six) hours as needed for nausea or  vomiting.   Yes Historical Provider, MD  ranitidine (ZANTAC) 300 MG tablet Take 300 mg by mouth at bedtime.   Yes Historical Provider, MD  traMADol (ULTRAM) 50 MG tablet Take 25 mg by mouth every 6 (six) hours as needed for moderate pain.   Yes Historical Provider, MD   Physical Exam: Filed Vitals:   02/27/14 1307  BP: 158/60  Pulse: 64  Temp: 97.7 F (36.5 C)  Resp: 18    BP 158/60  Pulse 64  Temp(Src) 97.7 F (36.5 C) (Oral)  Resp 18  Ht '4\' 11"'  (1.499 m)  Wt 65.772 kg (145 lb)  BMI 29.27 kg/m2  SpO2 99%  General:  Appears calm and comfortable Eyes: PERRL, normal lids, irises & conjunctiva Cardiovascular: RRR, no m/r/g. No LE  edema. Respiratory: CTA bilaterally, no w/r/r. Normal respiratory effort. Abdomen: soft, ntnd Skin: no rash or induration seen on limited exam Musculoskeletal: grossly normal tone BUE/BLE Psychiatric: grossly normal mood and affect, speech fluent and appropriate Neurologic: grossly non-focal.          Labs on Admission:  Basic Metabolic Panel:  Recent Labs Lab 02/23/14 1008 02/27/14 1029  NA 141 126*  K 3.9 3.7  CL  --  88*  CO2 24 22  GLUCOSE 130 132*  BUN 26.7* 24*  CREATININE 1.4* 1.11*  CALCIUM 10.0 9.2   Liver Function Tests:  Recent Labs Lab 02/23/14 1008 02/27/14 1029  AST 22 31  ALT 22 29  ALKPHOS 89 101  BILITOT 0.64 1.0  PROT 6.7 6.7  ALBUMIN 3.6 3.6    Recent Labs Lab 02/27/14 1029  LIPASE 21   No results found for this basename: AMMONIA,  in the last 168 hours CBC:  Recent Labs Lab 02/23/14 1008 02/27/14 1029  WBC 6.8 9.0  NEUTROABS 4.4 8.1*  HGB 11.3* 11.0*  HCT 33.8* 31.7*  MCV 93.8 89.5  PLT 302 168   Cardiac Enzymes:  Recent Labs Lab 02/27/14 1029  TROPONINI <0.30    BNP (last 3 results)  Recent Labs  02/07/14 2123  PROBNP 2388.0*   CBG: No results found for this basename: GLUCAP,  in the last 168 hours  Radiological Exams on Admission: Dg Chest 2 View  02/27/2014   CLINICAL  DATA:  LOSS OF CONSCIOUSNESS  EXAM: CT HEAD WITHOUT CONTRAST  TECHNIQUE: Contiguous axial images were obtained from the base of the skull through the vertex without intravenous contrast.  COMPARISON:  Head CT 09/06/2012  FINDINGS: No acute intracranial abnormality. Specifically, no hemorrhage, hydrocephalus, mass lesion, acute infarction, or significant intracranial injury. No acute calvarial abnormality. The visualized paranasal sinuses and mastoid air cells are patent.  IMPRESSION: No acute intracranial abnormality.   Electronically Signed   By: Margaree Mackintosh M.D.   On: 02/27/2014 11:15   Ct Head Wo Contrast  02/27/2014   CLINICAL DATA:  LOSS OF CONSCIOUSNESS  EXAM: CT HEAD WITHOUT CONTRAST  TECHNIQUE: Contiguous axial images were obtained from the base of the skull through the vertex without intravenous contrast.  COMPARISON:  Head CT 09/06/2012  FINDINGS: No acute intracranial abnormality. Specifically, no hemorrhage, hydrocephalus, mass lesion, acute infarction, or significant intracranial injury. No acute calvarial abnormality. The visualized paranasal sinuses and mastoid air cells are patent.  IMPRESSION: No acute intracranial abnormality.   Electronically Signed   By: Margaree Mackintosh M.D.   On: 02/27/2014 11:15    EKG: sinus rhythm.   Assessment/Plan Active Problems:   Syncope   Syncope: Possibly Secondary to orthostatic hypotension.  - admitted to telemetry. Started her on IV normal saline. Holding all bp medications. Repeat orthostatic vital signs in am. CT head without contrast did not reveal any pathology.   Hyponatremia: - possibly secondary to dehydration vs SIADH. Fluids overnight and recheck labs in am. If still persistently low, will order labs for evaluation of SIADH.   Mild Renal Insufficiency: Possibly from dehydration. Repeat in am.    Mild anemia: - stable . Continue to monitor.   Atrial fibrillation: sinus  - rate well controlled.  Rate his between 40's to 60's.   - will hold metoprolol and amiodarone. Continue to monitor. Resume amiodarone, when rate is better. Holding aspirin as she was found to have guaiac positive stools.    Nausea, vomiting and Diarrhea: - send  for GI pathogen PCR.  - hydration and symptomatic management.  guiac positive stools: - she is on full dose aspirin and takes ibuprofen. She also has a h/o of hemorrhoids.  She reports she is due for another colonoscopy . She sees [facility] as outpatient. We will monitor her H&h , if it drops please call Gi in am for possible colonoscopy. Holding aspirin and ibuprofen.    DVT prophylaxis.        Code Status: full code.  Family Communication: daughter at bedside, discussed the plan of care witht he family Disposition Plan: admit to telemetry  Time spent: 65 minutes.   [facility] Pager [phone]  **Disclaimer: This note may have been dictated with voice recognition software. Similar sounding words can inadvertently be transcribed and this note may contain transcription errors which may not have been corrected upon publication of note.**

## 2014-02-27 NOTE — ED Notes (Signed)
Patient will try to urinate again once fluids are ran

## 2014-02-27 NOTE — ED Notes (Signed)
Bed: RJ73 Expected date:  Expected time:  Means of arrival:  Comments: EMS-syncopal episode

## 2014-02-28 DIAGNOSIS — I4891 Unspecified atrial fibrillation: Secondary | ICD-10-CM

## 2014-02-28 DIAGNOSIS — R55 Syncope and collapse: Secondary | ICD-10-CM

## 2014-02-28 DIAGNOSIS — E860 Dehydration: Secondary | ICD-10-CM

## 2014-02-28 LAB — CBC
HEMATOCRIT: 30 % — AB (ref 36.0–46.0)
HEMOGLOBIN: 10.2 g/dL — AB (ref 12.0–15.0)
MCH: 31.2 pg (ref 26.0–34.0)
MCHC: 34 g/dL (ref 30.0–36.0)
MCV: 91.7 fL (ref 78.0–100.0)
Platelets: 136 10*3/uL — ABNORMAL LOW (ref 150–400)
RBC: 3.27 MIL/uL — AB (ref 3.87–5.11)
RDW: 17.2 % — ABNORMAL HIGH (ref 11.5–15.5)
WBC: 3.6 10*3/uL — ABNORMAL LOW (ref 4.0–10.5)

## 2014-02-28 LAB — BASIC METABOLIC PANEL
BUN: 22 mg/dL (ref 6–23)
CHLORIDE: 100 meq/L (ref 96–112)
CO2: 26 meq/L (ref 19–32)
Calcium: 8.9 mg/dL (ref 8.4–10.5)
Creatinine, Ser: 1.29 mg/dL — ABNORMAL HIGH (ref 0.50–1.10)
GFR calc Af Amer: 47 mL/min — ABNORMAL LOW (ref >90)
GFR calc non Af Amer: 41 mL/min — ABNORMAL LOW (ref >90)
GLUCOSE: 112 mg/dL — AB (ref 70–99)
Potassium: 4.4 mEq/L (ref 3.7–5.3)
SODIUM: 136 meq/L — AB (ref 137–147)

## 2014-02-28 MED ORDER — LOSARTAN POTASSIUM 25 MG PO TABS: 25.0000 mg | ORAL_TABLET | Freq: Every day | ORAL | Status: DC

## 2014-02-28 NOTE — Progress Notes (Signed)
UR Completed.  [name], Kimberly [name] [phone] 02/28/2014  

## 2014-02-28 NOTE — Progress Notes (Signed)
Report received from K. Alba Destine, RN. [name] agree with previous RN's assessment. Will resume care of pt and continue to monitor. Kimberly [name]

## 2014-02-28 NOTE — Discharge Instructions (Signed)
Dehydration Dehydration is when you lose more fluids from the body than you take in. Vital organs such as the kidneys, brain, and heart cannot function without a proper amount of fluids and salt. Any loss of fluids from the body can cause dehydration.  Older adults are at a higher risk of dehydration than younger adults. As we age, our bodies are less able to conserve water and do not respond to temperature changes as well. Also, older adults do not become thirsty as easily or quickly. Because of this, older adults often do not realize they need to increase fluids to avoid dehydration.  CAUSES   Vomiting.  Diarrhea.  Excessive sweating.  Excessive urination.  Fever.  Certain medicines, such as blood pressure medicines called diuretics.  Poorly controlled blood sugars. SIGNS AND SYMPTOMS  Mild dehydration:  Thirst.  Dry lips.  Slightly dry mouth. Moderate dehydration:  Very dry mouth.  Sunken eyes.  Skin does not bounce back quickly when lightly pinched and released.  Dark urine and decreased urine production.  Decreased tear production.  Headache. Severe dehydration:  Very dry mouth.  Extreme thirst.  Rapid, weak pulse (more than 100 beats per minute at rest).  Cold hands and feet.  Not able to sweat in spite of heat.  Rapid breathing.  Blue lips.  Confusion and lethargy.  Difficulty being awakened.  Minimal urine production.  No tears. DIAGNOSIS  Your health care provider will diagnose dehydration based on your symptoms and your exam. Blood and urine tests will help confirm the diagnosis. The diagnostic evaluation should also identify the cause of dehydration. TREATMENT  Treatment of mild or moderate dehydration can often be done at home by increasing the amount of fluids that you drink. It is best to drink small amounts of fluid more often. Drinking too much at one time can make vomiting worse. Severe dehydration needs to be treated at the hospital.  You may be given IV fluids that contain water and electrolytes. HOME CARE INSTRUCTIONS   Ask your health care provider about specific rehydration instructions.  Drink enough fluids to keep your urine clear or pale yellow.  Drink small amounts frequently if you have nausea and vomiting.  Eat as you normally do.  Avoid:  Foods or drinks high in sugar.  Carbonated drinks.  Juice.  Extremely hot or cold fluids.  Drinks with caffeine.  Fatty, greasy foods.  Alcohol.  Tobacco.  Overeating.  Gelatin desserts.  Wash your hands well to avoid spreading bacteria and viruses.  Only take over-the-counter or prescription medicines for pain, discomfort, or fever as directed by your health care provider.  Ask your health care provider if you should continue all prescribed and over-the-counter medicines.  Keep all follow-up appointments with your health care provider. SEEK MEDICAL CARE IF:  You have abdominal pain, and it increases or stays in one area (localizes).  You have a rash, stiff neck, or severe headache.  You are irritable, sleepy, or difficult to awaken.  You are weak, dizzy, or extremely thirsty.  You have a fever. SEEK IMMEDIATE MEDICAL CARE IF:   You are unable to keep fluids down, or you get worse despite treatment.  You have frequent episodes of vomiting or diarrhea.  You have blood or green matter (bile) in your vomit.  You have blood in your stool, or your stool looks black and tarry.  You have not urinated in 6-8 hours, or you have only urinated a small amount of very dark urine.  You faint. MAKE SURE YOU:   Understand these instructions.  Will watch your condition.  Will get help right away if you are not doing well or get worse. Document Released: 11/11/2003 Document Revised: 08/26/2013 Document Reviewed: 04/28/2013 [facility] Patient Information 2015 [place], [place]. This information is not intended to replace advice given to you by your  health care provider. Make sure you discuss any questions you have with your health care provider.

## 2014-02-28 NOTE — Discharge Summary (Signed)
Physician Discharge Summary  Kimberly [name] HUD:[id number] DOB: 12-29-1940 DOA: 02/27/2014  PCP: [name], MD  Admit date: 02/27/2014 Discharge date: 02/28/2014  Time spent: 35 minutes  Recommendations for Outpatient Follow-up:  Follow up with PCP in 1-2 weeks  Discharge Diagnoses:  Active Problems:   Syncope   Lung cancer, middle lobe   Atrial fibrillation   Diarrhea   Hyponatremia   Anemia   Discharge Condition: Improved  Diet recommendation: Regular  Filed Weights   02/27/14 1307  Weight: 145 lb (65.772 kg)    History of present illness:  Kimberly [name] is a 73 y.o. female with prior h/o hypertension, Hypothyroidism, atrial fibrillation, lung cancer s/p chemo last Monday, comes in for an episode of syncope witnessed by family. Pt stood up from sitting position and passed out, family close by were able to catch her from falling to the ground, . Patient does not remember the events. She reports typical events after chemo, like nausea, vomiting and diarrhea, watery. She denies any other complaints. On arrival to ED, her orthostatics were positive. She was also found to have mild renal insufficiency, hyponatremic. She was referred to medical service for admission.   Hospital Course:  Syncope:  Likely Secondary to orthostatic hypotension.  - admitted to telemetry. Improved with IV normal saline. Helding all bp medications.  CT head without contrast did not reveal any pathology.  Hyponatremia:  - possibly secondary to dehydration vs SIADH. Fluids given overnight with sodium much improved  Mild Renal Insufficiency:  Possibly from dehydration.  Mild anemia:  - stable . Continue to monitor.  Atrial fibrillation: sinus  - rate well controlled.  Rate his between 40's to 60's.  - held metoprolol and amiodarone. Continue to monitor. Resume amiodarone, when rate is better. Temporarily held aspirin as she was found to have guaiac positive stools.  Nausea, vomiting  and Diarrhea:  - GI pathogen PCR neg - cont on hydration and symptomatic management.  guiac positive stools:  - she is on full dose aspirin and takes ibuprofen. She also has a h/o of hemorrhoids.  She reports she is due for another colonoscopy . She sees [facility] as outpatient. We will monitor her H&h. Hgb on day of discharge was 10.2, same as on 6/11.  Discharge Exam: Filed Vitals:   02/27/14 1307 02/27/14 2117 02/28/14 0444 02/28/14 1300  BP: 158/60 143/54 154/64 182/75  Pulse: 64 74 73 76  Temp: 97.7 F (36.5 C) 98 F (36.7 C) 98.1 F (36.7 C) 98.2 F (36.8 C)  TempSrc:  Oral Oral Oral  Resp: 18 18 18 18   Height: 4\' 11"  (1.499 m)     Weight: 145 lb (65.772 kg)     SpO2: 99% 96% 98% 100%    General: Awake, in nad Cardiovascular: regular, s1, s2 Respiratory: normal resp effort, no wheezing  Discharge Instructions     Medication List    STOP taking these medications       irbesartan-hydrochlorothiazide 150-12.5 MG per tablet  Commonly known as:  AVALIDE      TAKE these medications       acetaminophen 500 MG tablet  Commonly known as:  TYLENOL  Take 500 mg by mouth every 6 (six) hours as needed for mild pain.     albuterol 108 (90 BASE) MCG/ACT inhaler  Commonly known as:  PROVENTIL HFA;VENTOLIN HFA  Inhale 1 puff into the lungs every 6 (six) hours as needed for wheezing or shortness of breath.  amiodarone 200 MG tablet  Commonly known as:  PACERONE  Take 2 tablets (400 mg total) by mouth daily. For one month then 200 mg daily     aspirin EC 325 MG tablet  Take 1 tablet (325 mg total) by mouth daily.     atorvastatin 20 MG tablet  Commonly known as:  LIPITOR  Take 20 mg by mouth daily.     docusate sodium 100 MG capsule  Commonly known as:  COLACE  Take 100 mg by mouth 2 (two) times daily as needed for mild constipation or moderate constipation.     EPIPEN IJ  Inject 1 each as directed once as needed (for reaction to milk products).     ibuprofen  200 MG tablet  Commonly known as:  ADVIL,MOTRIN  Take 200 mg by mouth every 6 (six) hours as needed for moderate pain.     levothyroxine 125 MCG tablet  Commonly known as:  SYNTHROID, LEVOTHROID  Take 125 mcg by mouth daily before breakfast.     loratadine 10 MG tablet  Commonly known as:  CLARITIN  Take 10 mg by mouth daily as needed for allergies.     losartan 25 MG tablet  Commonly known as:  COZAAR  Take 1 tablet (25 mg total) by mouth daily.     metoprolol succinate 25 MG 24 hr tablet  Commonly known as:  TOPROL-XL  Take 12.5 mg by mouth daily.     montelukast 10 MG tablet  Commonly known as:  SINGULAIR  Take 10 mg by mouth daily.     pantoprazole 40 MG tablet  Commonly known as:  PROTONIX  Take 40 mg by mouth 2 (two) times daily.     prochlorperazine 10 MG tablet  Commonly known as:  COMPAZINE  Take 10 mg by mouth every 6 (six) hours as needed for nausea or vomiting.     ranitidine 300 MG tablet  Commonly known as:  ZANTAC  Take 300 mg by mouth at bedtime.     traMADol 50 MG tablet  Commonly known as:  ULTRAM  Take 25 mg by mouth every 6 (six) hours as needed for moderate pain.       Allergies  Allergen Reactions  . Milk-Related Compounds Other (See Comments)    REACTION: GI upset, projectile vomiting   Follow-up Information   Follow up with [name], MD. Schedule an appointment as soon as possible for a visit in 1 week.   Specialty:  Internal Medicine   Contact information:   [address] [place]  [address] [phone]        The results of significant diagnostics from this hospitalization (including imaging, microbiology, ancillary and laboratory) are listed below for reference.    Significant Diagnostic Studies: Dg Chest 2 View  02/27/2014   CLINICAL DATA:  LOSS OF CONSCIOUSNESS  EXAM: CT HEAD WITHOUT CONTRAST  TECHNIQUE: Contiguous axial images were obtained from the base of the skull through the vertex without  intravenous contrast.  COMPARISON:  Head CT 09/06/2012  FINDINGS: No acute intracranial abnormality. Specifically, no hemorrhage, hydrocephalus, mass lesion, acute infarction, or significant intracranial injury. No acute calvarial abnormality. The visualized paranasal sinuses and mastoid air cells are patent.  IMPRESSION: No acute intracranial abnormality.   Electronically Signed   By: Margaree Mackintosh M.D.   On: 02/27/2014 11:15   Ct Head Wo Contrast  02/27/2014   CLINICAL DATA:  LOSS OF CONSCIOUSNESS  EXAM: CT HEAD WITHOUT CONTRAST  TECHNIQUE:  Contiguous axial images were obtained from the base of the skull through the vertex without intravenous contrast.  COMPARISON:  Head CT 09/06/2012  FINDINGS: No acute intracranial abnormality. Specifically, no hemorrhage, hydrocephalus, mass lesion, acute infarction, or significant intracranial injury. No acute calvarial abnormality. The visualized paranasal sinuses and mastoid air cells are patent.  IMPRESSION: No acute intracranial abnormality.   Electronically Signed   By: Margaree Mackintosh M.D.   On: 02/27/2014 11:15   Ct Chest W Contrast  02/13/2014   CLINICAL DATA:  Lung cancer diagnosed 2/15 with chemotherapy and radiation therapy complete. Cough. 30 pound weight loss. Shortness of breath. Restaging of non-small-cell lung cancer.  EXAM: CT CHEST WITH CONTRAST  TECHNIQUE: Multidetector CT imaging of the chest was performed during intravenous contrast administration.  CONTRAST:  106mL OMNIPAQUE IOHEXOL 300 MG/ML  SOLN  COMPARISON:  02/07/2014 chest radiograph. PET 10/15/2013. Chest CT 09/19/2013.  FINDINGS: Lungs/Pleura: Slight improvement in right middle lobe bronchial aeration.  Mild centrilobular emphysema. Calcified granulomas within the left lower lobe.  Improved right middle lobe aeration. Central lung lesion and surrounding volume loss are difficult to differentiate. An area of relative hypoattenuation centrally measures 3.7 x 2.6 cm on image 27/series 2. Compare  5.5 x 3.3 cm on the prior PET.  Trace bilateral pleural effusions, larger on the right. New since prior PET. Minimal loculated anterior right pleural fluid which is also new.  Heart/Mediastinum: No supraclavicular adenopathy. A left-sided thyroidectomy.  Descending thoracic aortic atherosclerosis. Mild cardiomegaly with coronary artery atherosclerosis. No central pulmonary embolism, on this non-dedicated study. 1.0 cm subcarinal node on image 25 is similar to on the prior PET and was hypermetabolic on that study. No hilar adenopathy.  Upper Abdomen: Suspicion of mild hepatic steatosis. Normal imaged portions of the spleen, stomach, pancreas, adrenal glands, kidneys.  Bones/Musculoskeletal:  No acute osseous abnormality.  IMPRESSION: 1. Improved right middle lobe aeration. Although the central mass is difficult to differentiate from surrounding volume loss, felt to be decreased in size. 2. A subcarinal node is similar in size to on the prior exam. Not pathologic by size criteria, but hypermetabolic on prior PET. 3. No evidence of new or progressive disease. 4. New small bilateral pleural effusions with small volume anterior loculated right pleural fluid. 5.  Atherosclerosis, including within the coronary arteries.   Electronically Signed   By: Abigail Miyamoto M.D.   On: 02/13/2014 11:05   Dg Chest Port 1 View  02/07/2014   CLINICAL DATA:  Shortness of breath.  History of pneumonia.  EXAM: PORTABLE CHEST - 1 VIEW  COMPARISON:  Chest x-ray 01/11/2014.  FINDINGS: Persistent ill-defined opacities throughout the right middle lobe appears worse compared to the prior examination. Probable small right pleural effusion. Right hilar fullness. The patient is rotated to the right on today's exam, resulting in distortion of the mediastinal contours and reduced diagnostic sensitivity and specificity for mediastinal pathology. Heart size is normal. Atherosclerosis in the thoracic aorta.  IMPRESSION: 1. Persistent right hilar  fullness and ill-defined opacity throughout the right middle lobe, presumably related to residual tumor in this patient with history of lung cancer. Findings suggest progression of disease compared to prior studies. 2. Small right pleural effusion appear slightly increased compared to prior examinations.   Electronically Signed   By: Vinnie Langton M.D.   On: 02/07/2014 11:42    Microbiology: No results found for this or any previous visit (from the past 240 hour(s)).   Labs: Basic Metabolic Panel:  Recent Labs Lab  02/23/14 1008 02/27/14 1029 02/28/14 0508  NA 141 126* 136*  K 3.9 3.7 4.4  CL  --  88* 100  CO2 24 22 26   GLUCOSE 130 132* 112*  BUN 26.7* 24* 22  CREATININE 1.4* 1.11* 1.29*  CALCIUM 10.0 9.2 8.9   Liver Function Tests:  Recent Labs Lab 02/23/14 1008 02/27/14 1029  AST 22 31  ALT 22 29  ALKPHOS 89 101  BILITOT 0.64 1.0  PROT 6.7 6.7  ALBUMIN 3.6 3.6    Recent Labs Lab 02/27/14 1029  LIPASE 21   No results found for this basename: AMMONIA,  in the last 168 hours CBC:  Recent Labs Lab 02/23/14 1008 02/27/14 1029 02/28/14 0508  WBC 6.8 9.0 3.6*  NEUTROABS 4.4 8.1*  --   HGB 11.3* 11.0* 10.2*  HCT 33.8* 31.7* 30.0*  MCV 93.8 89.5 91.7  PLT 302 168 136*   Cardiac Enzymes:  Recent Labs Lab 02/27/14 1029  TROPONINI <0.30   BNP: BNP (last 3 results)  Recent Labs  02/07/14 2123  PROBNP 2388.0*   CBG: No results found for this basename: GLUCAP,  in the last 168 hours   Signed:  CHIU, STEPHEN K  [facility] 02/28/2014, 2:13 PM

## 2014-03-01 LAB — URINE CULTURE: Colony Count: 45000

## 2014-03-01 NOTE — Assessment & Plan Note (Signed)
Controlled.  

## 2014-03-01 NOTE — Assessment & Plan Note (Signed)
Followed by Dr. Earlie Server

## 2014-03-01 NOTE — Assessment & Plan Note (Addendum)
Spontaneous conversion in the hospital to sinus rhythm and has maintained sinus rhythm. I am decreasing her amiodarone to 400 mg once day for one month and then 200 mg daily. She'll follow with Dr. Debara Pickett in several weeks at that point she will need a TSH evaluated. Recent hepatic panel was normal.  Her QTC is mildly prolonged but with decreasing amiodarone this should improve.

## 2014-03-02 ENCOUNTER — Telehealth: Payer: Self-pay | Admitting: Internal Medicine

## 2014-03-02 ENCOUNTER — Telehealth: Payer: Self-pay | Admitting: *Deleted

## 2014-03-02 ENCOUNTER — Other Ambulatory Visit (HOSPITAL_BASED_OUTPATIENT_CLINIC_OR_DEPARTMENT_OTHER): Payer: Medicare Other

## 2014-03-02 DIAGNOSIS — C342 Malignant neoplasm of middle lobe, bronchus or lung: Secondary | ICD-10-CM

## 2014-03-02 DIAGNOSIS — C3490 Malignant neoplasm of unspecified part of unspecified bronchus or lung: Secondary | ICD-10-CM

## 2014-03-02 LAB — COMPREHENSIVE METABOLIC PANEL (CC13)
ALT: 42 U/L (ref 0–55)
AST: 18 U/L (ref 5–34)
Albumin: 3.1 g/dL — ABNORMAL LOW (ref 3.5–5.0)
Alkaline Phosphatase: 73 U/L (ref 40–150)
Anion Gap: 9 mEq/L (ref 3–11)
BUN: 12.5 mg/dL (ref 7.0–26.0)
CALCIUM: 8.9 mg/dL (ref 8.4–10.4)
CHLORIDE: 102 meq/L (ref 98–109)
CO2: 23 mEq/L (ref 22–29)
CREATININE: 0.9 mg/dL (ref 0.6–1.1)
Glucose: 127 mg/dl (ref 70–140)
Potassium: 4.3 mEq/L (ref 3.5–5.1)
Sodium: 134 mEq/L — ABNORMAL LOW (ref 136–145)
Total Bilirubin: 0.74 mg/dL (ref 0.20–1.20)
Total Protein: 6 g/dL — ABNORMAL LOW (ref 6.4–8.3)

## 2014-03-02 LAB — CBC WITH DIFFERENTIAL/PLATELET
BASO%: 0 % (ref 0.0–2.0)
Basophils Absolute: 0 10*3/uL (ref 0.0–0.1)
EOS%: 16.7 % — ABNORMAL HIGH (ref 0.0–7.0)
Eosinophils Absolute: 0.2 10*3/uL (ref 0.0–0.5)
HEMATOCRIT: 28.2 % — AB (ref 34.8–46.6)
HGB: 9.5 g/dL — ABNORMAL LOW (ref 11.6–15.9)
LYMPH%: 49 % (ref 14.0–49.7)
MCH: 30.9 pg (ref 25.1–34.0)
MCHC: 33.7 g/dL (ref 31.5–36.0)
MCV: 91.9 fL (ref 79.5–101.0)
MONO#: 0.3 10*3/uL (ref 0.1–0.9)
MONO%: 29.2 % — AB (ref 0.0–14.0)
NEUT#: 0.1 10*3/uL — CL (ref 1.5–6.5)
NEUT%: 5.1 % — ABNORMAL LOW (ref 38.4–76.8)
RBC: 3.07 10*6/uL — ABNORMAL LOW (ref 3.70–5.45)
RDW: 17 % — ABNORMAL HIGH (ref 11.2–14.5)
WBC: 1 10*3/uL — ABNORMAL LOW (ref 3.9–10.3)
lymph#: 0.5 10*3/uL — ABNORMAL LOW (ref 0.9–3.3)
nRBC: 0 % (ref 0–0)

## 2014-03-02 MED ORDER — CIPROFLOXACIN HCL 500 MG PO TABS: 500.0000 mg | ORAL_TABLET | Freq: Two times a day (BID) | ORAL | Status: DC

## 2014-03-02 NOTE — Telephone Encounter (Signed)
Pt was discharged from the hospital on Saturday. The doctor stopped her Irbesartan Hydrochlorithiazine and he changed it to [employer]. She does not wants  To takr this without Dr Debara Pickett approval.

## 2014-03-02 NOTE — Telephone Encounter (Signed)
Pt's daughter called with questions and concerns.  Pt's appetite is decreased, she is trying to stay hydrated but was hospitalized for a few days with dehydration and a syncopal episode.  Pt's daughter states she is eating, but it is not as much as she was eating.  She cannot drink dairy products and does not like ensure clear.  She wants to know if Dr Vista Mink is going to over see her BP and hear medications, informed her that her heart MD or PCP should oversee this.  She verbalized understanding.  Will make a NTR consult.  She also is having some new tingling in her fingers.  Advised she keep watching and will reassess pt at f/u with [facility] on 7/13.  They are to call back if anything changes or if they have any further questions.  SLJ

## 2014-03-02 NOTE — Telephone Encounter (Signed)
Dr. Debara Pickett please review and return to me with recommendations.

## 2014-03-02 NOTE — Telephone Encounter (Signed)
Patient informed per Dr. Debara Pickett ok to take medication given at time of discharge. Patient voiced understanding of this and will take losartan as directed.

## 2014-03-02 NOTE — Telephone Encounter (Signed)
That's fine .[name] It appears she was dehydrated, so stopping that medicine will help.  Dr. Debara Pickett

## 2014-03-02 NOTE — Telephone Encounter (Signed)
ANC 0.1, WBC 1.0.    Per Dr Vista Mink, pt had neulasta 6/23, she needs to take cipro 500mg  BID x 5 days.  Spoke with patient, she verbalized understanding.  SLJ

## 2014-03-03 ENCOUNTER — Telehealth: Payer: Self-pay | Admitting: *Deleted

## 2014-03-03 NOTE — Telephone Encounter (Signed)
Patient called wanting a lab request sent to [facility] [place]. Order faxed.

## 2014-03-04 ENCOUNTER — Telehealth: Payer: Self-pay | Admitting: Internal Medicine

## 2014-03-04 NOTE — Telephone Encounter (Signed)
Cld pt advised per 06/30 POF cancelled 07/06 labs and added Nut to schedule 0713 pt confirmed advised of updated schedule on MyChart

## 2014-03-09 ENCOUNTER — Other Ambulatory Visit: Payer: Medicare Other

## 2014-03-09 ENCOUNTER — Telehealth: Payer: Self-pay | Admitting: Medical Oncology

## 2014-03-09 NOTE — Telephone Encounter (Signed)
refaxed lab order to new Fax number

## 2014-03-16 ENCOUNTER — Ambulatory Visit: Payer: Medicare Other | Admitting: Nutrition

## 2014-03-16 ENCOUNTER — Ambulatory Visit (HOSPITAL_BASED_OUTPATIENT_CLINIC_OR_DEPARTMENT_OTHER): Payer: Medicare Other

## 2014-03-16 ENCOUNTER — Other Ambulatory Visit (HOSPITAL_BASED_OUTPATIENT_CLINIC_OR_DEPARTMENT_OTHER): Payer: Medicare Other

## 2014-03-16 ENCOUNTER — Encounter: Payer: Self-pay | Admitting: Internal Medicine

## 2014-03-16 ENCOUNTER — Ambulatory Visit (HOSPITAL_BASED_OUTPATIENT_CLINIC_OR_DEPARTMENT_OTHER): Payer: Medicare Other | Admitting: Internal Medicine

## 2014-03-16 VITALS — BP 157/59 | HR 65 | Temp 98.2°F | Resp 18 | Ht 59.0 in | Wt 153.6 lb

## 2014-03-16 DIAGNOSIS — R0609 Other forms of dyspnea: Secondary | ICD-10-CM

## 2014-03-16 DIAGNOSIS — R0989 Other specified symptoms and signs involving the circulatory and respiratory systems: Secondary | ICD-10-CM

## 2014-03-16 DIAGNOSIS — C342 Malignant neoplasm of middle lobe, bronchus or lung: Secondary | ICD-10-CM

## 2014-03-16 DIAGNOSIS — Z5111 Encounter for antineoplastic chemotherapy: Secondary | ICD-10-CM

## 2014-03-16 DIAGNOSIS — R5383 Other fatigue: Secondary | ICD-10-CM

## 2014-03-16 DIAGNOSIS — R5381 Other malaise: Secondary | ICD-10-CM

## 2014-03-16 DIAGNOSIS — G609 Hereditary and idiopathic neuropathy, unspecified: Secondary | ICD-10-CM

## 2014-03-16 DIAGNOSIS — C3490 Malignant neoplasm of unspecified part of unspecified bronchus or lung: Secondary | ICD-10-CM

## 2014-03-16 LAB — COMPREHENSIVE METABOLIC PANEL (CC13)
ALT: 21 U/L (ref 0–55)
AST: 16 U/L (ref 5–34)
Albumin: 3 g/dL — ABNORMAL LOW (ref 3.5–5.0)
Alkaline Phosphatase: 81 U/L (ref 40–150)
Anion Gap: 9 mEq/L (ref 3–11)
BUN: 14.6 mg/dL (ref 7.0–26.0)
CO2: 25 mEq/L (ref 22–29)
Calcium: 9 mg/dL (ref 8.4–10.4)
Chloride: 109 mEq/L (ref 98–109)
Creatinine: 1.1 mg/dL (ref 0.6–1.1)
Glucose: 84 mg/dl (ref 70–140)
POTASSIUM: 3.8 meq/L (ref 3.5–5.1)
Sodium: 143 mEq/L (ref 136–145)
Total Bilirubin: 0.33 mg/dL (ref 0.20–1.20)
Total Protein: 5.6 g/dL — ABNORMAL LOW (ref 6.4–8.3)

## 2014-03-16 LAB — CBC WITH DIFFERENTIAL/PLATELET
BASO%: 1.3 % (ref 0.0–2.0)
Basophils Absolute: 0.1 10*3/uL (ref 0.0–0.1)
EOS%: 6.6 % (ref 0.0–7.0)
Eosinophils Absolute: 0.5 10*3/uL (ref 0.0–0.5)
HCT: 25.9 % — ABNORMAL LOW (ref 34.8–46.6)
HGB: 8.6 g/dL — ABNORMAL LOW (ref 11.6–15.9)
LYMPH%: 10.3 % — ABNORMAL LOW (ref 14.0–49.7)
MCH: 32.8 pg (ref 25.1–34.0)
MCHC: 33.4 g/dL (ref 31.5–36.0)
MCV: 98.2 fL (ref 79.5–101.0)
MONO#: 0.5 10*3/uL (ref 0.1–0.9)
MONO%: 6.7 % (ref 0.0–14.0)
NEUT%: 75.1 % (ref 38.4–76.8)
NEUTROS ABS: 5.4 10*3/uL (ref 1.5–6.5)
Platelets: 210 10*3/uL (ref 145–400)
RBC: 2.63 10*6/uL — ABNORMAL LOW (ref 3.70–5.45)
RDW: 19.3 % — AB (ref 11.2–14.5)
WBC: 7.2 10*3/uL (ref 3.9–10.3)
lymph#: 0.7 10*3/uL — ABNORMAL LOW (ref 0.9–3.3)

## 2014-03-16 MED ORDER — FAMOTIDINE IN NACL 20-0.9 MG/50ML-% IV SOLN
20.0000 mg | Freq: Once | INTRAVENOUS | Status: AC
  Administered 2014-03-16: 20 mg via INTRAVENOUS

## 2014-03-16 MED ORDER — FAMOTIDINE IN NACL 20-0.9 MG/50ML-% IV SOLN
INTRAVENOUS | Status: AC
  Filled 2014-03-16: qty 50

## 2014-03-16 MED ORDER — ONDANSETRON 16 MG/50ML IVPB (CHCC)
INTRAVENOUS | Status: AC
  Filled 2014-03-16: qty 16

## 2014-03-16 MED ORDER — PACLITAXEL CHEMO INJECTION 300 MG/50ML
150.0000 mg/m2 | Freq: Once | INTRAVENOUS | Status: AC
  Administered 2014-03-16: 252 mg via INTRAVENOUS
  Filled 2014-03-16: qty 42

## 2014-03-16 MED ORDER — DIPHENHYDRAMINE HCL 50 MG/ML IJ SOLN
INTRAMUSCULAR | Status: AC
  Filled 2014-03-16: qty 1

## 2014-03-16 MED ORDER — ONDANSETRON 16 MG/50ML IVPB (CHCC)
16.0000 mg | Freq: Once | INTRAVENOUS | Status: AC
  Administered 2014-03-16: 16 mg via INTRAVENOUS

## 2014-03-16 MED ORDER — SODIUM CHLORIDE 0.9 % IV SOLN
Freq: Once | INTRAVENOUS | Status: AC
  Administered 2014-03-16: 10:00:00 via INTRAVENOUS

## 2014-03-16 MED ORDER — CARBOPLATIN CHEMO INJECTION 450 MG/45ML
306.4000 mg | Freq: Once | INTRAVENOUS | Status: AC
  Administered 2014-03-16: 310 mg via INTRAVENOUS
  Filled 2014-03-16: qty 31

## 2014-03-16 MED ORDER — DEXAMETHASONE SODIUM PHOSPHATE 20 MG/5ML IJ SOLN
INTRAMUSCULAR | Status: AC
  Filled 2014-03-16: qty 5

## 2014-03-16 MED ORDER — DIPHENHYDRAMINE HCL 50 MG/ML IJ SOLN
50.0000 mg | Freq: Once | INTRAMUSCULAR | Status: AC
  Administered 2014-03-16: 50 mg via INTRAVENOUS

## 2014-03-16 MED ORDER — DEXAMETHASONE SODIUM PHOSPHATE 20 MG/5ML IJ SOLN
20.0000 mg | Freq: Once | INTRAMUSCULAR | Status: AC
  Administered 2014-03-16: 20 mg via INTRAVENOUS

## 2014-03-16 NOTE — Progress Notes (Addendum)
Patient is a 73 year old female diagnosed with non-small cell lung cancer receiving concurrent chemoradiation therapy.  She is a patient of Dr. Earlie Server.  Past medical history includes hypertension, thyroid disease, GERD, dyslipidemia, pneumonia, anxiety, atrial fibrillation.  Medications include Lipitor, Colace, Synthroid, Protonix, Compazine, Zantac.  Labs include sodium 134 and albumin 3.1 on June 29.  Height: 4 feet 11 inches. Weight: 153.6 pounds July 13. Usual body weight: 173, January 2015. BMI: 31.01.  Patient states she thinks she is eating well.  She has kept a food Journal to show me that she is eating 3 meals daily.  She does not drink oral nutrition supplements because she does not tolerate dairy foods.  Patient reports vomiting after consuming dairy.  Patient has tried soy milk and soy ice cream with good success.  Patient's daughter is a vegetarian, and has good knowledge of vegetarian protein sources.  She is a good resource for her mother.  Patient is not opposed to consuming meats.  Nutrition diagnosis: Unintended weight loss related to diagnosis of lung cancer and associated treatments as evidenced by 20 pound weight loss over 7 months  Intervention: Patient was educated to continue healthy diet to include plenty of protein foods.  Patient has good understanding of how to assemble healthy meals.  Patient to continue soy milk and soy shakes as needed to promote weight stabilization.  No nutrition supplements required.  Patient provided with contact information.  Questions answered.  Teach back method used.  Monitoring, evaluation, goals: Patient will tolerate adequate calories and protein for weight maintenance.  Next visit: Monday, August 3, during chemotherapy.    **Disclaimer: This note was dictated with voice recognition software. Similar sounding words can inadvertently be transcribed and this note may contain transcription errors which may not have been corrected upon  publication of note.**

## 2014-03-16 NOTE — Patient Instructions (Signed)
[facility] Discharge Instructions for Patients Receiving Chemotherapy  Today you received the following chemotherapy agents taxol and carboplatin  To help prevent nausea and vomiting after your treatment, we encourage you to take your nausea medication as needed.   If you develop nausea and vomiting that is not controlled by your nausea medication, call the clinic.   BELOW ARE SYMPTOMS THAT SHOULD BE REPORTED IMMEDIATELY:  *FEVER GREATER THAN 100.5 F  *CHILLS WITH OR WITHOUT FEVER  NAUSEA AND VOMITING THAT IS NOT CONTROLLED WITH YOUR NAUSEA MEDICATION  *UNUSUAL SHORTNESS OF BREATH  *UNUSUAL BRUISING OR BLEEDING  TENDERNESS IN MOUTH AND THROAT WITH OR WITHOUT PRESENCE OF ULCERS  *URINARY PROBLEMS  *BOWEL PROBLEMS  UNUSUAL RASH Items with * indicate a potential emergency and should be followed up as soon as possible.  Feel free to call the clinic you have any questions or concerns. The clinic phone number is [phone].

## 2014-03-16 NOTE — Progress Notes (Signed)
[facility] [phone]   [phone]  OFFICE PROGRESS NOTE  [name], MD [address] [name] [address]  DIAGNOSIS: Unresectable, likely a stage IIIA (T2a, N2, M0) non-small cell lung cancer, squamous cell carcinoma diagnosed in February 2015.  PRIOR THERAPY:  1) On 11/13/2013 the patient underwent exploratory right VATS with mediastinal biopsy under the care of Dr. Roxan Hockey. 2) Concurrent chemoradiation with weekly carboplatin for AUC of 2 and paclitaxel 45 mg/M2, status post 6 cycles with partial response. First cycle was given on 12/01/2013.   CURRENT THERAPY: Consolidation chemotherapy with carboplatin for AUC of 5 and paclitaxel 175 mg/M2 every 3 weeks with Neulasta support. First dose 02/23/2014.  INTERVAL HISTORY: Kimberly [name] 73 y.o. female returns to the clinic today for followup visit. She was admitted after the first cycle of her systemic chemotherapy to [facility] with syncopal episode secondary to orthostatic hypotension. Her condition improved with IV hydration with normal saline. She also had significant neutropenia after the first cycle of this treatment was absolute neutrophil count of 101 week after her treatment. The patient is feeling fine today with no specific complaints except for mild fatigue. She denied having any significant nausea or vomiting, no fever or chills. She denied having any chest pain but continues to have shortness of breath with exertion with no hemoptysis. She continues to have mild numbness and tingling is in her fingers and toes. She is here today to start cycle #2.   MEDICAL HISTORY: Past Medical History  Diagnosis Date  . Hypertension   . Thyroid disease   . GERD (gastroesophageal reflux disease)   . Dyslipidemia   . Aortic insufficiency   . Mitral insufficiency   . History of nuclear stress test 02/26/2006    exercise myoview; normal pattern of  perfusion; low risk scan   . PONV (postoperative nausea and vomiting)   . Heart murmur   . Hypothyroidism   . Cough     dry, endobronchial mass  . Pneumonia     pus bronchitis  . Arthritis   . Syncope     "states she has passed out a few times. dr is trying to find cause.last time when geeting ready to go home after video bronch/bx  . Shortness of breath     with exertion  . Anxiety     due to surgery   . Bronchitis   . Radiation 12/01/13-01/08/14    50.4 gray to right central chest  . Atrial fibrillation   . Squamous cell carcinoma of lung     ALLERGIES:  is allergic to milk-related compounds.  MEDICATIONS:  Current Outpatient Prescriptions  Medication Sig Dispense Refill  . acetaminophen (TYLENOL) 500 MG tablet Take 500 mg by mouth every 6 (six) hours as needed for mild pain.       [name] albuterol (PROVENTIL HFA;VENTOLIN HFA) 108 (90 BASE) MCG/ACT inhaler Inhale 1 puff into the lungs every 6 (six) hours as needed for wheezing or shortness of breath.      [name] amiodarone (PACERONE) 200 MG tablet Take 2 tablets (400 mg total) by mouth daily. For one month then 200 mg daily  120 tablet  3  . aspirin EC 325 MG tablet Take 1 tablet (325 mg total) by mouth daily.  30 tablet  0  . atorvastatin (LIPITOR) 20 MG tablet Take 20 mg by mouth daily.      [name] docusate sodium (COLACE) 100 MG capsule  Take 100 mg by mouth 2 (two) times daily as needed for mild constipation or moderate constipation.       [name] EPINEPHrine (EPIPEN IJ) Inject 1 each as directed once as needed (for reaction to milk products).      [name] ibuprofen (ADVIL,MOTRIN) 200 MG tablet Take 200 mg by mouth every 6 (six) hours as needed for moderate pain.      [name] levothyroxine (SYNTHROID, LEVOTHROID) 125 MCG tablet Take 125 mcg by mouth daily before breakfast.      . loratadine (CLARITIN) 10 MG tablet Take 10 mg by mouth daily as needed for allergies.      [name] losartan (COZAAR) 25 MG tablet Take 1 tablet (25 mg total) by mouth daily.  30 tablet  0    . metoprolol succinate (TOPROL-XL) 25 MG 24 hr tablet Take 12.5 mg by mouth daily.      . montelukast (SINGULAIR) 10 MG tablet Take 10 mg by mouth daily.      . pantoprazole (PROTONIX) 40 MG tablet Take 40 mg by mouth 2 (two) times daily.       . prochlorperazine (COMPAZINE) 10 MG tablet Take 10 mg by mouth every 6 (six) hours as needed for nausea or vomiting.      . ranitidine (ZANTAC) 300 MG tablet Take 300 mg by mouth at bedtime.      . traMADol (ULTRAM) 50 MG tablet Take 25 mg by mouth every 6 (six) hours as needed for moderate pain.       No current facility-administered medications for this visit.    SURGICAL HISTORY:  Past Surgical History  Procedure Laterality Date  . Thyroidectomy  1973  . Dilation and curettage of uterus    . Carpal tunnel release Right 1988  . Rotator cuff repair  2006    ? side  . H/o met test w/pft  04/02/2012    low risk; peak VO2 77% predicted  . Cardiac catheterization  07/08/2008    normal coronaries  . Transthoracic echocardiogram  09/25/2012    EF 55-60%; mild LVH & mild concentric hypertrophy; mild AV regurg; RV systolic pressure increase consistent with mild pulm HTN  . Video bronchoscopy Bilateral 09/25/2013    Procedure: VIDEO BRONCHOSCOPY WITHOUT FLUORO;  Surgeon: Tanda Rockers, MD;  Location: [facility];  Service: Cardiopulmonary;  Laterality: Bilateral;  . Joint replacement  2003    thumb rt  . Knee arthroscopy  12    rt meniscus  . Video bronchoscopy with endobronchial ultrasound N/A 10/30/2013    Procedure: VIDEO BRONCHOSCOPY WITH ENDOBRONCHIAL ULTRASOUND;  Surgeon: Melrose Nakayama, MD;  Location: [facility];  Service: Thoracic;  Laterality: N/A;  . Mediastinoscopy N/A 10/30/2013    Procedure: MEDIASTINOSCOPY;  Surgeon: Melrose Nakayama, MD;  Location: [place];  Service: Thoracic;  Laterality: N/A;  . Eye surgery Bilateral   . Video assisted thoracoscopy (vats)/ lobectomy Right 11/13/2013    Procedure: VIDEO ASSISTED THORACOSCOPY (VATS)  with mediastinal  biopsies;  Surgeon: Melrose Nakayama, MD;  Location: [place];  Service: Thoracic;  Laterality: Right;  RIGHT VATS,mediastinal biopsies    REVIEW OF SYSTEMS:  Constitutional: positive for fatigue Eyes: negative Ears, nose, mouth, throat, and face: negative Respiratory: positive for cough and dyspnea on exertion Cardiovascular: negative Gastrointestinal: negative Genitourinary:negative Integument/breast: negative Hematologic/lymphatic: negative Musculoskeletal:negative Neurological: negative Behavioral/Psych: negative Endocrine: negative Allergic/Immunologic: negative   PHYSICAL EXAMINATION: General appearance: alert, cooperative, fatigued and no distress Head: Normocephalic, without obvious abnormality, atraumatic Neck: no adenopathy, no  JVD, supple, symmetrical, trachea midline and thyroid not enlarged, symmetric, no tenderness/mass/nodules Lymph nodes: Cervical, supraclavicular, and axillary nodes normal. Resp: clear to auscultation bilaterally Back: symmetric, no curvature. ROM normal. No CVA tenderness. Cardio: regular rate and rhythm, S1, S2 normal, no murmur, click, rub or gallop GI: soft, non-tender; bowel sounds normal; no masses,  no organomegaly Extremities: extremities normal, atraumatic, no cyanosis or edema Neurologic: Alert and oriented X 3, normal strength and tone. Normal symmetric reflexes. Normal coordination and gait  ECOG PERFORMANCE STATUS: 1 - Symptomatic but completely ambulatory  Blood pressure 157/59, pulse 65, temperature 98.2 F (36.8 C), temperature source Oral, resp. rate 18, height _0  (1.499 m), weight 153 lb 9.6 oz (69.673 kg).  LABORATORY DATA: Lab Results  Component Value Date   WBC 7.2 03/16/2014   HGB 8.6* 03/16/2014   HCT 25.9* 03/16/2014   MCV 98.2 03/16/2014   PLT 210 03/16/2014      Chemistry      Component Value Date/Time   NA 143 03/16/2014 0901   NA 136* 02/28/2014 0508   K 3.8 03/16/2014 0901   K 4.4 02/28/2014  0508   CL 100 02/28/2014 0508   CO2 25 03/16/2014 0901   CO2 26 02/28/2014 0508   BUN 14.6 03/16/2014 0901   BUN 22 02/28/2014 0508   CREATININE 1.1 03/16/2014 0901   CREATININE 1.29* 02/28/2014 0508      Component Value Date/Time   CALCIUM 9.0 03/16/2014 0901   CALCIUM 8.9 02/28/2014 0508   ALKPHOS 81 03/16/2014 0901   ALKPHOS 101 02/27/2014 1029   AST 16 03/16/2014 0901   AST 31 02/27/2014 1029   ALT 21 03/16/2014 0901   ALT 29 02/27/2014 1029   BILITOT 0.33 03/16/2014 0901   BILITOT 1.0 02/27/2014 1029       RADIOGRAPHIC STUDIES:  ASSESSMENT AND PLAN: This is a very pleasant 73 years old white female with unresectable stage IIIa non-small cell lung cancer completed a course of concurrent chemoradiation with weekly carboplatin and paclitaxel status post 6 cycles.  She is currently undergoing consolidation chemotherapy with carboplatin for AUC of 5 and paclitaxel 175 mg/M2 status post 1 cycle. The first cycle was complicated with significant neutropenia in addition to syncopal episode secondary to orthostatic hypotension. I recommended for the patient to proceed with the second cycle of her treatment today as scheduled but I would reduce the dose of carboplatin to AUC of 4 and paclitaxel 150 MG/M2 because of her peripheral neuropathy. She would come back for followup visit in 3 weeks with the start of cycle #3. She was advised to call immediately if she has any concerning symptoms in the interval. The patient voices understanding of current disease status and treatment options and is in agreement with the current care plan.  All questions were answered. The patient knows to call the clinic with any problems, questions or concerns. We can certainly see the patient much sooner if necessary.  Disclaimer: This note was dictated with voice recognition software. Similar sounding words can inadvertently be transcribed and may not be corrected upon review.

## 2014-03-17 ENCOUNTER — Telehealth: Payer: Self-pay | Admitting: Internal Medicine

## 2014-03-17 ENCOUNTER — Ambulatory Visit (HOSPITAL_BASED_OUTPATIENT_CLINIC_OR_DEPARTMENT_OTHER): Payer: Medicare Other

## 2014-03-17 VITALS — BP 134/52 | HR 69 | Temp 98.3°F

## 2014-03-17 DIAGNOSIS — C342 Malignant neoplasm of middle lobe, bronchus or lung: Secondary | ICD-10-CM

## 2014-03-17 DIAGNOSIS — Z5189 Encounter for other specified aftercare: Secondary | ICD-10-CM

## 2014-03-17 MED ORDER — PEGFILGRASTIM INJECTION 6 MG/0.6ML
6.0000 mg | Freq: Once | SUBCUTANEOUS | Status: AC
  Administered 2014-03-17: 6 mg via SUBCUTANEOUS
  Filled 2014-03-17: qty 0.6

## 2014-03-17 NOTE — Telephone Encounter (Signed)
Pt confirmed labs/ov/injection per 07/13 POF, gave pt AVS...Marland KitchenMarland KitchenKJ

## 2014-03-23 ENCOUNTER — Telehealth: Payer: Self-pay | Admitting: Internal Medicine

## 2014-03-23 ENCOUNTER — Other Ambulatory Visit: Payer: Self-pay

## 2014-03-23 ENCOUNTER — Other Ambulatory Visit (HOSPITAL_BASED_OUTPATIENT_CLINIC_OR_DEPARTMENT_OTHER): Payer: Medicare Other

## 2014-03-23 ENCOUNTER — Telehealth: Payer: Self-pay | Admitting: Medical Oncology

## 2014-03-23 ENCOUNTER — Emergency Department (HOSPITAL_COMMUNITY)
Admission: EM | Admit: 2014-03-23 | Discharge: 2014-03-23 | Disposition: A | Discharge status: Home / Self Care | Payer: Medicare Other | Attending: Emergency Medicine | Admitting: Emergency Medicine

## 2014-03-23 ENCOUNTER — Encounter (HOSPITAL_COMMUNITY): Payer: Self-pay | Admitting: Emergency Medicine

## 2014-03-23 DIAGNOSIS — R5383 Other fatigue: Secondary | ICD-10-CM

## 2014-03-23 DIAGNOSIS — D649 Anemia, unspecified: Secondary | ICD-10-CM

## 2014-03-23 DIAGNOSIS — Z79899 Other long term (current) drug therapy: Secondary | ICD-10-CM | POA: Insufficient documentation

## 2014-03-23 DIAGNOSIS — C342 Malignant neoplasm of middle lobe, bronchus or lung: Secondary | ICD-10-CM

## 2014-03-23 DIAGNOSIS — E039 Hypothyroidism, unspecified: Secondary | ICD-10-CM | POA: Insufficient documentation

## 2014-03-23 DIAGNOSIS — I10 Essential (primary) hypertension: Secondary | ICD-10-CM | POA: Insufficient documentation

## 2014-03-23 DIAGNOSIS — Z87891 Personal history of nicotine dependence: Secondary | ICD-10-CM | POA: Insufficient documentation

## 2014-03-23 DIAGNOSIS — K219 Gastro-esophageal reflux disease without esophagitis: Secondary | ICD-10-CM | POA: Insufficient documentation

## 2014-03-23 DIAGNOSIS — R55 Syncope and collapse: Secondary | ICD-10-CM | POA: Insufficient documentation

## 2014-03-23 DIAGNOSIS — Z7982 Long term (current) use of aspirin: Secondary | ICD-10-CM | POA: Insufficient documentation

## 2014-03-23 DIAGNOSIS — Z8701 Personal history of pneumonia (recurrent): Secondary | ICD-10-CM | POA: Insufficient documentation

## 2014-03-23 DIAGNOSIS — E785 Hyperlipidemia, unspecified: Secondary | ICD-10-CM | POA: Insufficient documentation

## 2014-03-23 DIAGNOSIS — C3490 Malignant neoplasm of unspecified part of unspecified bronchus or lung: Secondary | ICD-10-CM

## 2014-03-23 DIAGNOSIS — R5381 Other malaise: Secondary | ICD-10-CM | POA: Insufficient documentation

## 2014-03-23 DIAGNOSIS — R197 Diarrhea, unspecified: Secondary | ICD-10-CM | POA: Insufficient documentation

## 2014-03-23 DIAGNOSIS — F411 Generalized anxiety disorder: Secondary | ICD-10-CM | POA: Insufficient documentation

## 2014-03-23 DIAGNOSIS — M129 Arthropathy, unspecified: Secondary | ICD-10-CM | POA: Insufficient documentation

## 2014-03-23 DIAGNOSIS — R011 Cardiac murmur, unspecified: Secondary | ICD-10-CM | POA: Insufficient documentation

## 2014-03-23 LAB — COMPREHENSIVE METABOLIC PANEL
ALBUMIN: 3.1 g/dL — AB (ref 3.5–5.2)
ALT: 20 U/L (ref 0–35)
ANION GAP: 14 (ref 5–15)
AST: 16 U/L (ref 0–37)
Alkaline Phosphatase: 81 U/L (ref 39–117)
BUN: 13 mg/dL (ref 6–23)
CALCIUM: 9.8 mg/dL (ref 8.4–10.5)
CO2: 24 mEq/L (ref 19–32)
Chloride: 98 mEq/L (ref 96–112)
Creatinine, Ser: 1.17 mg/dL — ABNORMAL HIGH (ref 0.50–1.10)
GFR calc Af Amer: 53 mL/min — ABNORMAL LOW (ref >90)
GFR, EST NON AFRICAN AMERICAN: 46 mL/min — AB (ref >90)
Glucose, Bld: 120 mg/dL — ABNORMAL HIGH (ref 70–99)
Potassium: 4.1 mEq/L (ref 3.7–5.3)
Sodium: 136 mEq/L — ABNORMAL LOW (ref 137–147)
Total Bilirubin: 0.5 mg/dL (ref 0.3–1.2)
Total Protein: 6.1 g/dL (ref 6.0–8.3)

## 2014-03-23 LAB — CBC WITH DIFFERENTIAL/PLATELET
BASO%: 1.2 % (ref 0.0–2.0)
BASOS ABS: 0 10*3/uL (ref 0.0–0.1)
Basophils Absolute: 0 10*3/uL (ref 0.0–0.1)
Basophils Relative: 0 % (ref 0–1)
EOS PCT: 6 % — AB (ref 0–5)
EOS%: 8.9 % — AB (ref 0.0–7.0)
Eosinophils Absolute: 0.1 10*3/uL (ref 0.0–0.5)
Eosinophils Absolute: 0.1 10*3/uL (ref 0.0–0.7)
HCT: 26.7 % — ABNORMAL LOW (ref 34.8–46.6)
HCT: 25 % — ABNORMAL LOW (ref 36.0–46.0)
HGB: 8.9 g/dL — ABNORMAL LOW (ref 11.6–15.9)
Hemoglobin: 8.8 g/dL — ABNORMAL LOW (ref 12.0–15.0)
LYMPH#: 0.5 10*3/uL — AB (ref 0.9–3.3)
LYMPH%: 36.3 % (ref 14.0–49.7)
LYMPHS ABS: 1.3 10*3/uL (ref 0.7–4.0)
Lymphocytes Relative: 53 % — ABNORMAL HIGH (ref 12–46)
MCH: 32.8 pg (ref 25.1–34.0)
MCH: 33.1 pg (ref 26.0–34.0)
MCHC: 33.5 g/dL (ref 31.5–36.0)
MCHC: 35.2 g/dL (ref 30.0–36.0)
MCV: 94 fL (ref 78.0–100.0)
MCV: 97.9 fL (ref 79.5–101.0)
MONO#: 0.6 10*3/uL (ref 0.1–0.9)
MONO%: 42.6 % — ABNORMAL HIGH (ref 0.0–14.0)
MONOS PCT: 36 % — AB (ref 3–12)
Monocytes Absolute: 0.9 10*3/uL (ref 0.1–1.0)
NEUT#: 0.2 10*3/uL — CL (ref 1.5–6.5)
NEUT%: 11 % — ABNORMAL LOW (ref 38.4–76.8)
NEUTROS PCT: 5 % — AB (ref 43–77)
Neutro Abs: 0.1 10*3/uL — ABNORMAL LOW (ref 1.7–7.7)
Platelets: 85 10*3/uL — ABNORMAL LOW (ref 145–400)
Platelets: 81 10*3/uL — ABNORMAL LOW (ref 150–400)
RBC: 2.66 MIL/uL — AB (ref 3.87–5.11)
RBC: 2.72 10*6/uL — AB (ref 3.70–5.45)
RDW: 17.4 % — ABNORMAL HIGH (ref 11.2–14.5)
RDW: 16.2 % — ABNORMAL HIGH (ref 11.5–15.5)
WBC: 1.5 10*3/uL — ABNORMAL LOW (ref 3.9–10.3)
WBC: 2.4 10*3/uL — ABNORMAL LOW (ref 4.0–10.5)

## 2014-03-23 LAB — COMPREHENSIVE METABOLIC PANEL (CC13)
ALT: 22 U/L (ref 0–55)
AST: 15 U/L (ref 5–34)
Albumin: 3 g/dL — ABNORMAL LOW (ref 3.5–5.0)
Alkaline Phosphatase: 74 U/L (ref 40–150)
Anion Gap: 10 mEq/L (ref 3–11)
BUN: 12.7 mg/dL (ref 7.0–26.0)
CALCIUM: 9.6 mg/dL (ref 8.4–10.4)
CHLORIDE: 102 meq/L (ref 98–109)
CO2: 26 meq/L (ref 22–29)
Creatinine: 1.1 mg/dL (ref 0.6–1.1)
Glucose: 111 mg/dl (ref 70–140)
Potassium: 4.6 mEq/L (ref 3.5–5.1)
Sodium: 137 mEq/L (ref 136–145)
TOTAL PROTEIN: 6.1 g/dL — AB (ref 6.4–8.3)
Total Bilirubin: 0.54 mg/dL (ref 0.20–1.20)

## 2014-03-23 MED ORDER — SODIUM CHLORIDE 0.9 % IV BOLUS (SEPSIS)
1000.0000 mL | Freq: Once | INTRAVENOUS | Status: AC
  Administered 2014-03-23: 1000 mL via INTRAVENOUS

## 2014-03-23 NOTE — Discharge Instructions (Signed)
Make sure that you drink six 8 ounce glasses of water daily. Gatorade is also good. Call Dr.Muhammad if you are not feeling well. You can also return here if your condition worsens for any reason

## 2014-03-23 NOTE — ED Notes (Signed)
Jacubowitz MD notified pt is not orthostatic and denies dizziness with standing. Pt reports dizzy with walking earlier.

## 2014-03-23 NOTE — Telephone Encounter (Signed)
Sent michelle a staff message to add the iv fluids appt on 04/09/2014.

## 2014-03-23 NOTE — Telephone Encounter (Signed)
Pt passed out in car after phlebotomy and daughter took her to ED and she received IV fluids. I reviewed neutrapenic precautions with daughter. Daughter reports she is getting diarrhea on day 4 after chemo.  I instructed her to take Imodium at first episode. Onc tx request sent for IVF on aug 6th.

## 2014-03-23 NOTE — Telephone Encounter (Signed)
Pt in [facility] ED.

## 2014-03-23 NOTE — ED Provider Notes (Signed)
CSN: [id number]     Arrival date & time 03/23/14  0920 History   First MD Initiated Contact with Patient 03/23/14 [phone]     Chief Complaint  Patient presents with  . Near Syncope     (Consider location/radiation/quality/duration/timing/severity/associated sxs/prior Treatment) HPI Patient became lightheaded and suffered near syncopal event approximately 10 minutes after having phlebotomy performed this morning. She states that she did not feel well upon awakening this morning, felt generally weak. Symptoms typical after receiving chemotherapy. She last had chemotherapy for lung cancer 7 days ago. She admits to diarrhea 2 or 3 episodes per day for the past 6 days. Also typical after chemotherapy. Also admits to diminished appetite for the past several days, stating food does not taste good Denies fever denies cough denies shortness of breath denies chest pain denies other associated symptoms No other associated symptoms.  Past Medical History  Diagnosis Date  . Hypertension   . Thyroid disease   . GERD (gastroesophageal reflux disease)   . Dyslipidemia   . Aortic insufficiency   . Mitral insufficiency   . History of nuclear stress test 02/26/2006    exercise myoview; normal pattern of perfusion; low risk scan   . PONV (postoperative nausea and vomiting)   . Heart murmur   . Hypothyroidism   . Cough     dry, endobronchial mass  . Pneumonia     pus bronchitis  . Arthritis   . Syncope     "states she has passed out a few times. dr is trying to find cause.last time when geeting ready to go home after video bronch/bx  . Shortness of breath     with exertion  . Anxiety     due to surgery   . Bronchitis   . Radiation 12/01/13-01/08/14    50.4 gray to right central chest  . Atrial fibrillation   . Squamous cell carcinoma of lung    Past Surgical History  Procedure Laterality Date  . Thyroidectomy  1973  . Dilation and curettage of uterus    . Carpal tunnel release Right 1988  .  Rotator cuff repair  2006    ? side  . H/o met test w/pft  04/02/2012    low risk; peak VO2 77% predicted  . Cardiac catheterization  07/08/2008    normal coronaries  . Transthoracic echocardiogram  09/25/2012    EF 55-60%; mild LVH & mild concentric hypertrophy; mild AV regurg; RV systolic pressure increase consistent with mild pulm HTN  . Video bronchoscopy Bilateral 09/25/2013    Procedure: VIDEO BRONCHOSCOPY WITHOUT FLUORO;  Surgeon: Tanda Rockers, MD;  Location: [facility];  Service: Cardiopulmonary;  Laterality: Bilateral;  . Joint replacement  2003    thumb rt  . Knee arthroscopy  12    rt meniscus  . Video bronchoscopy with endobronchial ultrasound N/A 10/30/2013    Procedure: VIDEO BRONCHOSCOPY WITH ENDOBRONCHIAL ULTRASOUND;  Surgeon: Melrose Nakayama, MD;  Location: [place];  Service: Thoracic;  Laterality: N/A;  . Mediastinoscopy N/A 10/30/2013    Procedure: MEDIASTINOSCOPY;  Surgeon: Melrose Nakayama, MD;  Location: [facility];  Service: Thoracic;  Laterality: N/A;  . Eye surgery Bilateral   . Video assisted thoracoscopy (vats)/ lobectomy Right 11/13/2013    Procedure: VIDEO ASSISTED THORACOSCOPY (VATS) with mediastinal  biopsies;  Surgeon: Melrose Nakayama, MD;  Location: [place];  Service: Thoracic;  Laterality: Right;  RIGHT VATS,mediastinal biopsies   Family History  Problem Relation Age of Onset  .  Lung cancer Mother   . Heart attack Father   . Heart failure Maternal Grandfather   . Heart attack Paternal Grandfather    History  Substance Use Topics  . Smoking status: Former Smoker -- 0.50 packs/day for 6 years    Types: Cigarettes    Quit date: 09/05/1967  . Smokeless tobacco: Never Used     Comment: occ wine  . Alcohol Use: Yes     Comment: occasional 1-2 drinks/month   OB History   Grav Para Term Preterm Abortions TAB SAB Ect Mult Living                 Review of Systems  Constitutional: Positive for appetite change.  Gastrointestinal: Positive for  diarrhea.  Neurological: Positive for weakness.       Generalized weakness  All other systems reviewed and are negative.     Allergies  Milk-related compounds  Home Medications   Prior to Admission medications   Medication Sig Start Date End Date Taking? Authorizing Provider  acetaminophen (TYLENOL) 500 MG tablet Take 500 mg by mouth every 6 (six) hours as needed for mild pain.     Historical Provider, MD  albuterol (PROVENTIL HFA;VENTOLIN HFA) 108 (90 BASE) MCG/ACT inhaler Inhale 1 puff into the lungs every 6 (six) hours as needed for wheezing or shortness of breath.    Historical Provider, MD  amiodarone (PACERONE) 200 MG tablet Take 2 tablets (400 mg total) by mouth daily. For one month then 200 mg daily 02/25/14   Cecilie Kicks, NP  aspirin EC 325 MG tablet Take 1 tablet (325 mg total) by mouth daily. 02/09/14   Tarri Fuller, PA-C  atorvastatin (LIPITOR) 20 MG tablet Take 20 mg by mouth daily.    Historical Provider, MD  docusate sodium (COLACE) 100 MG capsule Take 100 mg by mouth 2 (two) times daily as needed for mild constipation or moderate constipation.     Historical Provider, MD  EPINEPHrine (EPIPEN IJ) Inject 1 each as directed once as needed (for reaction to milk products).    Historical Provider, MD  ibuprofen (ADVIL,MOTRIN) 200 MG tablet Take 200 mg by mouth every 6 (six) hours as needed for moderate pain.    Historical Provider, MD  levothyroxine (SYNTHROID, LEVOTHROID) 125 MCG tablet Take 125 mcg by mouth daily before breakfast.    Historical Provider, MD  loratadine (CLARITIN) 10 MG tablet Take 10 mg by mouth daily as needed for allergies.    Historical Provider, MD  losartan (COZAAR) 25 MG tablet Take 1 tablet (25 mg total) by mouth daily. 02/28/14   Donne Hazel, MD  metoprolol succinate (TOPROL-XL) 25 MG 24 hr tablet Take 12.5 mg by mouth daily.    Historical Provider, MD  montelukast (SINGULAIR) 10 MG tablet Take 10 mg by mouth daily. 04/15/13   Historical Provider, MD   pantoprazole (PROTONIX) 40 MG tablet Take 40 mg by mouth 2 (two) times daily.     Historical Provider, MD  prochlorperazine (COMPAZINE) 10 MG tablet Take 10 mg by mouth every 6 (six) hours as needed for nausea or vomiting.    Historical Provider, MD  ranitidine (ZANTAC) 300 MG tablet Take 300 mg by mouth at bedtime.    Historical Provider, MD  traMADol (ULTRAM) 50 MG tablet Take 25 mg by mouth every 6 (six) hours as needed for moderate pain.    Historical Provider, MD   BP 140/58  Pulse 69  Temp(Src) 97.8 F (36.6 C) (Oral)  Resp 19  SpO2 99% Physical Exam  Nursing note and vitals reviewed. Constitutional: She appears well-developed and well-nourished. No distress.  HENT:  Head: Normocephalic and atraumatic.  Eyes: Conjunctivae are normal. Pupils are equal, round, and reactive to light.  Neck: Neck supple. No tracheal deviation present. No thyromegaly present.  Cardiovascular: Normal rate and regular rhythm.   No murmur heard. Pulmonary/Chest: Effort normal and breath sounds normal.  Abdominal: Soft. Bowel sounds are normal. She exhibits no distension. There is no tenderness.  Musculoskeletal: Normal range of motion. She exhibits no edema and no tenderness.  Neurological: She is alert. Coordination normal.  Skin: Skin is warm and dry. No rash noted.  Psychiatric: She has a normal mood and affect.    ED Course  Procedures (including critical care time) Labs Review Labs Reviewed  COMPREHENSIVE METABOLIC PANEL  CBC WITH DIFFERENTIAL    Imaging Review No results found.   EKG Interpretation None      Date: 03/23/2014  Rate: 70  Rhythm: normal sinus rhythm  QRS Axis: left  Intervals: normal  ST/T Wave abnormalities: normal  Conduction Disutrbances:none  Narrative Interpretation:   Old EKG Reviewed: Unchanged from 10/28/2013 interpreted by me 10:45 AM patient feels well to go home after treatment with intravenous saline, 1 L. Results for orders placed during the  hospital encounter of 03/23/14  COMPREHENSIVE METABOLIC PANEL      Result Value Ref Range   Sodium 136 (*) 137 - 147 mEq/L   Potassium 4.1  3.7 - 5.3 mEq/L   Chloride 98  96 - 112 mEq/L   CO2 24  19 - 32 mEq/L   Glucose, Bld 120 (*) 70 - 99 mg/dL   BUN 13  6 - 23 mg/dL   Creatinine, Ser 1.17 (*) 0.50 - 1.10 mg/dL   Calcium 9.8  8.4 - 10.5 mg/dL   Total Protein 6.1  6.0 - 8.3 g/dL   Albumin 3.1 (*) 3.5 - 5.2 g/dL   AST 16  0 - 37 U/L   ALT 20  0 - 35 U/L   Alkaline Phosphatase 81  39 - 117 U/L   Total Bilirubin 0.5  0.3 - 1.2 mg/dL   GFR calc non Af Amer 46 (*) >90 mL/min   GFR calc Af Amer 53 (*) >90 mL/min   Anion gap 14  5 - 15  CBC WITH DIFFERENTIAL      Result Value Ref Range   WBC 2.4 (*) 4.0 - 10.5 K/uL   RBC 2.66 (*) 3.87 - 5.11 MIL/uL   Hemoglobin 8.8 (*) 12.0 - 15.0 g/dL   HCT 25.0 (*) 36.0 - 46.0 %   MCV 94.0  78.0 - 100.0 fL   MCH 33.1  26.0 - 34.0 pg   MCHC 35.2  30.0 - 36.0 g/dL   RDW 16.2 (*) 11.5 - 15.5 %   Platelets 81 (*) 150 - 400 K/uL   Neutrophils Relative % 5 (*) 43 - 77 %   Lymphocytes Relative 53 (*) 12 - 46 %   Monocytes Relative 36 (*) 3 - 12 %   Eosinophils Relative 6 (*) 0 - 5 %   Basophils Relative 0  0 - 1 %   Neutro Abs 0.1 (*) 1.7 - 7.7 K/uL   Lymphs Abs 1.3  0.7 - 4.0 K/uL   Monocytes Absolute 0.9  0.1 - 1.0 K/uL   Eosinophils Absolute 0.1  0.0 - 0.7 K/uL   Basophils Absolute 0.0  0.0 - 0.1 K/uL   WBC  Morphology DOHLE BODIES     Dg Chest 2 View  02/27/2014   CLINICAL DATA:  LOSS OF CONSCIOUSNESS  EXAM: CT HEAD WITHOUT CONTRAST  TECHNIQUE: Contiguous axial images were obtained from the base of the skull through the vertex without intravenous contrast.  COMPARISON:  Head CT 09/06/2012  FINDINGS: No acute intracranial abnormality. Specifically, no hemorrhage, hydrocephalus, mass lesion, acute infarction, or significant intracranial injury. No acute calvarial abnormality. The visualized paranasal sinuses and mastoid air cells are patent.   IMPRESSION: No acute intracranial abnormality.   Electronically Signed   By: Margaree Mackintosh M.D.   On: 02/27/2014 11:15   Ct Head Wo Contrast  02/27/2014   CLINICAL DATA:  LOSS OF CONSCIOUSNESS  EXAM: CT HEAD WITHOUT CONTRAST  TECHNIQUE: Contiguous axial images were obtained from the base of the skull through the vertex without intravenous contrast.  COMPARISON:  Head CT 09/06/2012  FINDINGS: No acute intracranial abnormality. Specifically, no hemorrhage, hydrocephalus, mass lesion, acute infarction, or significant intracranial injury. No acute calvarial abnormality. The visualized paranasal sinuses and mastoid air cells are patent.  IMPRESSION: No acute intracranial abnormality.   Electronically Signed   By: Margaree Mackintosh M.D.   On: 02/27/2014 11:15    MDM  Case discussed with Dr, Inda Merlin, oncology. Herbie Baltimore is unchanged. Patient is encouraged to  Hydrate orally. She can call oncology office if she's not feeling well or return here Near-syncope and generalized weakness felt to be secondary to chemotherapy and possibly mild dehydration. Anemia and thrombocytopenia are chronic Dx #1 near syncope #2 anemia #3 thrombocytopenia Final diagnoses:  None        Orlie Dakin, MD 03/23/14 1055

## 2014-03-23 NOTE — ED Notes (Addendum)
Per pt just left cancer center to have blood work. Daughter reports "could see it in her face", pt not feeling well. Pt reports tired and dizziness starting today but otherwise denies CP or SOB. Pt wide eyed but denies LOC. Pt alert and oriented x4. Pt currently receiving chemo.   Jacubowtiz MD at bedside.

## 2014-03-25 ENCOUNTER — Telehealth: Payer: Self-pay | Admitting: *Deleted

## 2014-03-25 NOTE — Telephone Encounter (Signed)
Per staff message and POF I have scheduled appts. Advised scheduler of appts. JMW  

## 2014-03-26 ENCOUNTER — Ambulatory Visit: Payer: Medicare Other | Admitting: Internal Medicine

## 2014-03-26 NOTE — Addendum Note (Signed)
Addended by: Vennie Homans on: 03/26/2014 04:32 PM   Modules accepted: Orders

## 2014-03-27 DIAGNOSIS — I4891 Unspecified atrial fibrillation: Secondary | ICD-10-CM

## 2014-03-28 ENCOUNTER — Telehealth: Payer: Self-pay | Admitting: Internal Medicine

## 2014-03-28 NOTE — Telephone Encounter (Signed)
Pt is aware of her iv fluid appt on 04/09/2014.

## 2014-03-30 ENCOUNTER — Other Ambulatory Visit (HOSPITAL_BASED_OUTPATIENT_CLINIC_OR_DEPARTMENT_OTHER): Payer: Medicare Other

## 2014-03-30 DIAGNOSIS — C3490 Malignant neoplasm of unspecified part of unspecified bronchus or lung: Secondary | ICD-10-CM

## 2014-03-30 DIAGNOSIS — C342 Malignant neoplasm of middle lobe, bronchus or lung: Secondary | ICD-10-CM

## 2014-03-30 LAB — CBC WITH DIFFERENTIAL/PLATELET
BASO%: 0.3 % (ref 0.0–2.0)
Basophils Absolute: 0.1 10*3/uL (ref 0.0–0.1)
EOS%: 0.3 % (ref 0.0–7.0)
Eosinophils Absolute: 0.1 10*3/uL (ref 0.0–0.5)
HCT: 25.2 % — ABNORMAL LOW (ref 34.8–46.6)
HGB: 8.2 g/dL — ABNORMAL LOW (ref 11.6–15.9)
LYMPH%: 3.7 % — ABNORMAL LOW (ref 14.0–49.7)
MCH: 32.2 pg (ref 25.1–34.0)
MCHC: 32.6 g/dL (ref 31.5–36.0)
MCV: 98.7 fL (ref 79.5–101.0)
MONO#: 1.2 10*3/uL — ABNORMAL HIGH (ref 0.1–0.9)
MONO%: 5.3 % (ref 0.0–14.0)
NEUT#: 19.9 10*3/uL — ABNORMAL HIGH (ref 1.5–6.5)
NEUT%: 90.4 % — ABNORMAL HIGH (ref 38.4–76.8)
Platelets: 206 10*3/uL (ref 145–400)
RBC: 2.55 10*6/uL — AB (ref 3.70–5.45)
RDW: 17.8 % — AB (ref 11.2–14.5)
WBC: 22 10*3/uL — ABNORMAL HIGH (ref 3.9–10.3)
lymph#: 0.8 10*3/uL — ABNORMAL LOW (ref 0.9–3.3)

## 2014-03-30 LAB — COMPREHENSIVE METABOLIC PANEL (CC13)
ALK PHOS: 125 U/L (ref 40–150)
ALT: 12 U/L (ref 0–55)
AST: 15 U/L (ref 5–34)
Albumin: 2.9 g/dL — ABNORMAL LOW (ref 3.5–5.0)
Anion Gap: 10 mEq/L (ref 3–11)
BUN: 10.2 mg/dL (ref 7.0–26.0)
CO2: 26 mEq/L (ref 22–29)
Calcium: 8.7 mg/dL (ref 8.4–10.4)
Chloride: 101 mEq/L (ref 98–109)
Creatinine: 1.1 mg/dL (ref 0.6–1.1)
Glucose: 134 mg/dl (ref 70–140)
POTASSIUM: 4 meq/L (ref 3.5–5.1)
SODIUM: 136 meq/L (ref 136–145)
Total Bilirubin: 0.48 mg/dL (ref 0.20–1.20)
Total Protein: 5.8 g/dL — ABNORMAL LOW (ref 6.4–8.3)

## 2014-04-02 ENCOUNTER — Other Ambulatory Visit: Payer: Self-pay

## 2014-04-02 ENCOUNTER — Other Ambulatory Visit: Payer: Self-pay | Admitting: Medical Oncology

## 2014-04-02 ENCOUNTER — Ambulatory Visit (HOSPITAL_COMMUNITY)
Admission: RE | Admit: 2014-04-02 | Discharge: 2014-04-02 | Disposition: A | Discharge status: Home / Self Care | Payer: Medicare Other | Source: Ambulatory Visit | Attending: Internal Medicine | Admitting: Internal Medicine

## 2014-04-02 ENCOUNTER — Ambulatory Visit: Payer: Medicare Other

## 2014-04-02 ENCOUNTER — Telehealth: Payer: Self-pay | Admitting: *Deleted

## 2014-04-02 ENCOUNTER — Encounter (HOSPITAL_COMMUNITY): Payer: Self-pay | Admitting: Emergency Medicine

## 2014-04-02 ENCOUNTER — Telehealth: Payer: Self-pay | Admitting: Medical Oncology

## 2014-04-02 ENCOUNTER — Observation Stay (HOSPITAL_COMMUNITY)
Admission: EM | Admit: 2014-04-02 | Discharge: 2014-04-03 | Disposition: A | Discharge status: Home / Self Care | Payer: Medicare Other | Attending: Internal Medicine | Admitting: Internal Medicine

## 2014-04-02 DIAGNOSIS — I482 Chronic atrial fibrillation, unspecified: Secondary | ICD-10-CM

## 2014-04-02 DIAGNOSIS — I10 Essential (primary) hypertension: Secondary | ICD-10-CM | POA: Insufficient documentation

## 2014-04-02 DIAGNOSIS — D72829 Elevated white blood cell count, unspecified: Secondary | ICD-10-CM | POA: Diagnosis not present

## 2014-04-02 DIAGNOSIS — D649 Anemia, unspecified: Secondary | ICD-10-CM | POA: Diagnosis not present

## 2014-04-02 DIAGNOSIS — Z923 Personal history of irradiation: Secondary | ICD-10-CM | POA: Diagnosis not present

## 2014-04-02 DIAGNOSIS — Z902 Acquired absence of lung [part of]: Secondary | ICD-10-CM | POA: Insufficient documentation

## 2014-04-02 DIAGNOSIS — J9809 Other diseases of bronchus, not elsewhere classified: Secondary | ICD-10-CM

## 2014-04-02 DIAGNOSIS — E871 Hypo-osmolality and hyponatremia: Secondary | ICD-10-CM

## 2014-04-02 DIAGNOSIS — Z79899 Other long term (current) drug therapy: Secondary | ICD-10-CM | POA: Insufficient documentation

## 2014-04-02 DIAGNOSIS — R531 Weakness: Secondary | ICD-10-CM

## 2014-04-02 DIAGNOSIS — J45909 Unspecified asthma, uncomplicated: Secondary | ICD-10-CM

## 2014-04-02 DIAGNOSIS — R55 Syncope and collapse: Secondary | ICD-10-CM | POA: Diagnosis present

## 2014-04-02 DIAGNOSIS — I4891 Unspecified atrial fibrillation: Secondary | ICD-10-CM | POA: Insufficient documentation

## 2014-04-02 DIAGNOSIS — E039 Hypothyroidism, unspecified: Secondary | ICD-10-CM | POA: Diagnosis not present

## 2014-04-02 DIAGNOSIS — Z87891 Personal history of nicotine dependence: Secondary | ICD-10-CM | POA: Diagnosis not present

## 2014-04-02 DIAGNOSIS — K529 Noninfective gastroenteritis and colitis, unspecified: Secondary | ICD-10-CM

## 2014-04-02 DIAGNOSIS — R0781 Pleurodynia: Secondary | ICD-10-CM

## 2014-04-02 DIAGNOSIS — Z9221 Personal history of antineoplastic chemotherapy: Secondary | ICD-10-CM | POA: Diagnosis not present

## 2014-04-02 DIAGNOSIS — J309 Allergic rhinitis, unspecified: Secondary | ICD-10-CM

## 2014-04-02 DIAGNOSIS — G453 Amaurosis fugax: Secondary | ICD-10-CM

## 2014-04-02 DIAGNOSIS — C342 Malignant neoplasm of middle lobe, bronchus or lung: Secondary | ICD-10-CM | POA: Diagnosis present

## 2014-04-02 DIAGNOSIS — C3490 Malignant neoplasm of unspecified part of unspecified bronchus or lung: Secondary | ICD-10-CM | POA: Diagnosis not present

## 2014-04-02 DIAGNOSIS — R197 Diarrhea, unspecified: Secondary | ICD-10-CM

## 2014-04-02 DIAGNOSIS — E785 Hyperlipidemia, unspecified: Secondary | ICD-10-CM | POA: Insufficient documentation

## 2014-04-02 DIAGNOSIS — Z96698 Presence of other orthopedic joint implants: Secondary | ICD-10-CM | POA: Diagnosis not present

## 2014-04-02 DIAGNOSIS — J189 Pneumonia, unspecified organism: Secondary | ICD-10-CM

## 2014-04-02 DIAGNOSIS — J9811 Atelectasis: Secondary | ICD-10-CM

## 2014-04-02 DIAGNOSIS — R042 Hemoptysis: Secondary | ICD-10-CM

## 2014-04-02 DIAGNOSIS — K219 Gastro-esophageal reflux disease without esophagitis: Secondary | ICD-10-CM | POA: Insufficient documentation

## 2014-04-02 DIAGNOSIS — R0602 Shortness of breath: Secondary | ICD-10-CM

## 2014-04-02 LAB — BASIC METABOLIC PANEL
Anion gap: 14 (ref 5–15)
BUN: 14 mg/dL (ref 6–23)
CHLORIDE: 94 meq/L — AB (ref 96–112)
CO2: 24 mEq/L (ref 19–32)
Calcium: 8.5 mg/dL (ref 8.4–10.5)
Creatinine, Ser: 1.06 mg/dL (ref 0.50–1.10)
GFR, EST AFRICAN AMERICAN: 60 mL/min — AB (ref >90)
GFR, EST NON AFRICAN AMERICAN: 52 mL/min — AB (ref >90)
Glucose, Bld: 116 mg/dL — ABNORMAL HIGH (ref 70–99)
POTASSIUM: 4.4 meq/L (ref 3.7–5.3)
Sodium: 132 mEq/L — ABNORMAL LOW (ref 137–147)

## 2014-04-02 LAB — PREPARE RBC (CROSSMATCH)

## 2014-04-02 LAB — CBC
HCT: 15.9 % — ABNORMAL LOW (ref 36.0–46.0)
Hemoglobin: 5.4 g/dL — CL (ref 12.0–15.0)
MCH: 33.5 pg (ref 26.0–34.0)
MCHC: 34 g/dL (ref 30.0–36.0)
MCV: 98.8 fL (ref 78.0–100.0)
Platelets: 261 10*3/uL (ref 150–400)
RBC: 1.61 MIL/uL — AB (ref 3.87–5.11)
RDW: 16.9 % — ABNORMAL HIGH (ref 11.5–15.5)
WBC: 31.5 10*3/uL — ABNORMAL HIGH (ref 4.0–10.5)

## 2014-04-02 LAB — HEMOGLOBIN: Hemoglobin: 7.1 g/dL — ABNORMAL LOW (ref 12.0–15.0)

## 2014-04-02 MED ORDER — MONTELUKAST SODIUM 10 MG PO TABS
10.0000 mg | ORAL_TABLET | Freq: Every day | ORAL | Status: DC
  Filled 2014-04-02: qty 1

## 2014-04-02 MED ORDER — SODIUM CHLORIDE 0.9 % IV BOLUS (SEPSIS)
2000.0000 mL | Freq: Once | INTRAVENOUS | Status: AC
  Administered 2014-04-02: 2000 mL via INTRAVENOUS

## 2014-04-02 MED ORDER — AMIODARONE HCL 200 MG PO TABS
200.0000 mg | ORAL_TABLET | Freq: Every day | ORAL | Status: DC
  Administered 2014-04-02 – 2014-04-03 (×2): 200 mg via ORAL
  Filled 2014-04-02 (×2): qty 1

## 2014-04-02 MED ORDER — EPINEPHRINE 0.3 MG/0.3ML IJ SOAJ: 0.3000 mg | Freq: Once | INTRAMUSCULAR | Status: DC | PRN

## 2014-04-02 MED ORDER — HYDROCODONE-ACETAMINOPHEN 5-325 MG PO TABS: 1.0000 | ORAL_TABLET | ORAL | Status: DC | PRN

## 2014-04-02 MED ORDER — ACETAMINOPHEN 650 MG RE SUPP: 650.0000 mg | Freq: Four times a day (QID) | RECTAL | Status: DC | PRN

## 2014-04-02 MED ORDER — SODIUM CHLORIDE 0.45 % IV SOLN
INTRAVENOUS | Status: DC
  Administered 2014-04-02: 20:00:00 via INTRAVENOUS

## 2014-04-02 MED ORDER — LEVOTHYROXINE SODIUM 125 MCG PO TABS
125.0000 ug | ORAL_TABLET | Freq: Every day | ORAL | Status: DC
  Administered 2014-04-03: 125 ug via ORAL
  Filled 2014-04-02 (×2): qty 1

## 2014-04-02 MED ORDER — ONDANSETRON HCL 4 MG/2ML IJ SOLN: 4.0000 mg | Freq: Four times a day (QID) | INTRAMUSCULAR | Status: DC | PRN

## 2014-04-02 MED ORDER — LORATADINE 10 MG PO TABS: 10.0000 mg | ORAL_TABLET | Freq: Every day | ORAL | Status: DC | PRN

## 2014-04-02 MED ORDER — ONDANSETRON HCL 4 MG PO TABS: 4.0000 mg | ORAL_TABLET | Freq: Four times a day (QID) | ORAL | Status: DC | PRN

## 2014-04-02 MED ORDER — ATORVASTATIN CALCIUM 20 MG PO TABS
20.0000 mg | ORAL_TABLET | Freq: Every day | ORAL | Status: DC
  Administered 2014-04-03: 20 mg via ORAL
  Filled 2014-04-02: qty 1

## 2014-04-02 MED ORDER — PROCHLORPERAZINE MALEATE 10 MG PO TABS
10.0000 mg | ORAL_TABLET | Freq: Four times a day (QID) | ORAL | Status: DC | PRN
  Filled 2014-04-02: qty 1

## 2014-04-02 MED ORDER — PANTOPRAZOLE SODIUM 40 MG IV SOLR
40.0000 mg | Freq: Two times a day (BID) | INTRAVENOUS | Status: DC
  Administered 2014-04-02 – 2014-04-03 (×2): 40 mg via INTRAVENOUS
  Filled 2014-04-02 (×3): qty 40

## 2014-04-02 MED ORDER — SODIUM CHLORIDE 0.9 % IV SOLN: Freq: Once | INTRAVENOUS | Status: DC

## 2014-04-02 MED ORDER — FUROSEMIDE 10 MG/ML IJ SOLN
20.0000 mg | Freq: Once | INTRAMUSCULAR | Status: AC
  Administered 2014-04-03: 20 mg via INTRAVENOUS
  Filled 2014-04-02: qty 2

## 2014-04-02 MED ORDER — ALBUTEROL SULFATE HFA 108 (90 BASE) MCG/ACT IN AERS
1.0000 | INHALATION_SPRAY | Freq: Four times a day (QID) | RESPIRATORY_TRACT | Status: DC | PRN
Start: 2014-04-02 — End: 2014-04-02

## 2014-04-02 MED ORDER — SODIUM CHLORIDE 0.9 % IV SOLN
Freq: Once | INTRAVENOUS | Status: AC
  Administered 2014-04-02: 22:00:00 via INTRAVENOUS

## 2014-04-02 MED ORDER — ZOLPIDEM TARTRATE 5 MG PO TABS: 5.0000 mg | ORAL_TABLET | Freq: Every evening | ORAL | Status: DC | PRN

## 2014-04-02 MED ORDER — ACETAMINOPHEN 325 MG PO TABS: 650.0000 mg | ORAL_TABLET | Freq: Four times a day (QID) | ORAL | Status: DC | PRN

## 2014-04-02 MED ORDER — METOPROLOL SUCCINATE 12.5 MG HALF TABLET
12.5000 mg | ORAL_TABLET | Freq: Every day | ORAL | Status: DC
  Administered 2014-04-03: 12.5 mg via ORAL
  Filled 2014-04-02: qty 1

## 2014-04-02 MED ORDER — ALBUTEROL SULFATE (2.5 MG/3ML) 0.083% IN NEBU: 2.5000 mg | INHALATION_SOLUTION | Freq: Four times a day (QID) | RESPIRATORY_TRACT | Status: DC | PRN

## 2014-04-02 NOTE — ED Provider Notes (Signed)
CSN: [id number]     Arrival date & time 04/02/14  1637 History   First MD Initiated Contact with Patient 04/02/14 1652     Chief Complaint  Patient presents with  . Near Syncope     (Consider location/radiation/quality/duration/timing/severity/associated sxs/prior Treatment) HPI 73 year old female with history of lung cancer, anemia, atrial fibrillation, recurrent syncope and near syncope, anorexia, chronic diarrhea daily for the last several months, last chemotherapy a couple weeks ago, yesterday had a few episodes of nonbloody vomiting without pain, today she did drink Pedialyte as well as a couple glasses of water and having a but still felt lightheaded and generally weak dry mouth and felt like she might faint so she called a friend to bring her to the hospital and while she was waiting for the friend the patient went to the bathroom had diarrhea felt lightheaded so she sat down in meter head against the wall so she would be safe he might of had a brief syncopal episode and woke up without trauma without headache without confusion without lateralizing weakness or numbness also without chest pain or shortness of breath but felt generally weak and lightheaded and thought she might need another blood transfusion so was brought to the ED for evaluation. She has no nausea or vomiting today just feels generally weak. She is no abdominal pain no bloody stools no chest pain no shortness breath. There is no treatment prior to arrival. Past Medical History  Diagnosis Date  . Hypertension   . Thyroid disease   . GERD (gastroesophageal reflux disease)   . Dyslipidemia   . Aortic insufficiency   . Mitral insufficiency   . History of nuclear stress test 02/26/2006    exercise myoview; normal pattern of perfusion; low risk scan   . PONV (postoperative nausea and vomiting)   . Heart murmur   . Hypothyroidism   . Cough     dry, endobronchial mass  . Pneumonia     pus bronchitis  . Arthritis   .  Syncope     "states she has passed out a few times. dr is trying to find cause.last time when geeting ready to go home after video bronch/bx  . Shortness of breath     with exertion  . Anxiety     due to surgery   . Bronchitis   . Radiation 12/01/13-01/08/14    50.4 gray to right central chest  . Atrial fibrillation   . Squamous cell carcinoma of lung    Past Surgical History  Procedure Laterality Date  . Thyroidectomy  1973  . Dilation and curettage of uterus    . Carpal tunnel release Right 1988  . Rotator cuff repair  2006    ? side  . H/o met test w/pft  04/02/2012    low risk; peak VO2 77% predicted  . Cardiac catheterization  07/08/2008    normal coronaries  . Transthoracic echocardiogram  09/25/2012    EF 55-60%; mild LVH & mild concentric hypertrophy; mild AV regurg; RV systolic pressure increase consistent with mild pulm HTN  . Video bronchoscopy Bilateral 09/25/2013    Procedure: VIDEO BRONCHOSCOPY WITHOUT FLUORO;  Surgeon: Tanda Rockers, MD;  Location: [facility];  Service: Cardiopulmonary;  Laterality: Bilateral;  . Joint replacement  2003    thumb rt  . Knee arthroscopy  12    rt meniscus  . Video bronchoscopy with endobronchial ultrasound N/A 10/30/2013    Procedure: VIDEO BRONCHOSCOPY WITH ENDOBRONCHIAL ULTRASOUND;  Surgeon: Revonda Standard  Roxan Hockey, MD;  Location: [place];  Service: Thoracic;  Laterality: N/A;  . Mediastinoscopy N/A 10/30/2013    Procedure: MEDIASTINOSCOPY;  Surgeon: Melrose Nakayama, MD;  Location: [place];  Service: Thoracic;  Laterality: N/A;  . Eye surgery Bilateral   . Video assisted thoracoscopy (vats)/ lobectomy Right 11/13/2013    Procedure: VIDEO ASSISTED THORACOSCOPY (VATS) with mediastinal  biopsies;  Surgeon: Melrose Nakayama, MD;  Location: [place];  Service: Thoracic;  Laterality: Right;  RIGHT VATS,mediastinal biopsies   Family History  Problem Relation Age of Onset  . Lung cancer Mother   . Heart attack Father   . Heart failure  Maternal Grandfather   . Heart attack Paternal Grandfather    History  Substance Use Topics  . Smoking status: Former Smoker -- 0.50 packs/day for 6 years    Types: Cigarettes    Quit date: 09/05/1967  . Smokeless tobacco: Never Used     Comment: occ wine  . Alcohol Use: Yes     Comment: occasional 1-2 drinks/month   OB History   Grav Para Term Preterm Abortions TAB SAB Ect Mult Living                 Review of Systems 10 Systems reviewed and are negative for acute change except as noted in the HPI.   Allergies  Milk-related compounds  Home Medications   Prior to Admission medications   Medication Sig Start Date End Date Taking? Authorizing Provider  albuterol (PROVENTIL HFA;VENTOLIN HFA) 108 (90 BASE) MCG/ACT inhaler Inhale 1 puff into the lungs every 6 (six) hours as needed for wheezing or shortness of breath.   Yes Historical Provider, MD  amiodarone (PACERONE) 200 MG tablet Take 200 mg by mouth every morning.    Yes Historical Provider, MD  atorvastatin (LIPITOR) 20 MG tablet Take 20 mg by mouth every morning.    Yes Historical Provider, MD  EPINEPHrine 0.3 mg/0.3 mL IJ SOAJ injection Inject 0.3 mg into the muscle once as needed (reaction to milk products).   Yes Historical Provider, MD  ibuprofen (ADVIL,MOTRIN) 200 MG tablet Take 200-400 mg by mouth every 6 (six) hours as needed for moderate pain.    Yes Historical Provider, MD  levothyroxine (SYNTHROID, LEVOTHROID) 125 MCG tablet Take 125 mcg by mouth daily before breakfast.   Yes Historical Provider, MD  loratadine (CLARITIN) 10 MG tablet Take 10 mg by mouth daily as needed for allergies. For 7 days after chemo   Yes Historical Provider, MD  metoprolol succinate (TOPROL-XL) 25 MG 24 hr tablet Take 12.5 mg by mouth daily.   Yes Historical Provider, MD  montelukast (SINGULAIR) 10 MG tablet Take 10 mg by mouth daily. 04/15/13  Yes Historical Provider, MD  pantoprazole (PROTONIX) 40 MG tablet Take 40 mg by mouth 2 (two) times  daily.    Yes Historical Provider, MD  prochlorperazine (COMPAZINE) 10 MG tablet Take 10 mg by mouth every 6 (six) hours as needed for nausea or vomiting.   Yes Historical Provider, MD  ranitidine (ZANTAC) 300 MG tablet Take 300 mg by mouth at bedtime.   Yes Historical Provider, MD   BP 132/49  Pulse 63  Temp(Src) 98 F (36.7 C) (Oral)  Resp 16  Ht '4\' 11"'  (1.499 m)  Wt 149 lb 11.1 oz (67.9 kg)  BMI 30.22 kg/m2  SpO2 98% Physical Exam  Nursing note and vitals reviewed. Constitutional:  Awake, alert, nontoxic appearance.  HENT:  Head: Atraumatic.  Eyes: Right eye exhibits  no discharge. Left eye exhibits no discharge.  Neck: Neck supple.  Cardiovascular: Normal rate and regular rhythm.   Murmur heard. Pulmonary/Chest: Effort normal and breath sounds normal. No respiratory distress. She has no wheezes. She has no rales. She exhibits no tenderness.  Abdominal: Soft. Bowel sounds are normal. She exhibits no distension and no mass. There is no tenderness. There is no rebound and no guarding.  Musculoskeletal: She exhibits no tenderness.  Baseline ROM, no obvious new focal weakness.  Neurological: She is alert.  Mental status and motor strength appears baseline for patient and situation. Patient moves all 4 extremities well.  Skin: No rash noted.  Psychiatric: She has a normal mood and affect.    ED Course  Procedures (including critical care time) Ordered transfusion.D/w [facility] for admit. 1815 Patient informed of clinical course, understand medical decision-making process, and agree with plan. CRITICAL CARE Performed by: Babette Relic Total critical care time: 49mn symptomatic anemia blood transfusion. Critical care time was exclusive of separately billable procedures and treating other patients. Critical care was necessary to treat or prevent imminent or life-threatening deterioration. Critical care was time spent personally by me on the following activities: development of  treatment plan with patient and/or surrogate as well as nursing, discussions with consultants, evaluation of patient's response to treatment, examination of patient, obtaining history from patient or surrogate, ordering and performing treatments and interventions, ordering and review of laboratory studies, ordering and review of radiographic studies, pulse oximetry and re-evaluation of patient's condition.  Labs Review Labs Reviewed  CBC - Abnormal; Notable for the following:    WBC 31.5 (*)    RBC 1.61 (*)    Hemoglobin 5.4 (*)    HCT 15.9 (*)    RDW 16.9 (*)    All other components within normal limits  BASIC METABOLIC PANEL - Abnormal; Notable for the following:    Sodium 132 (*)    Chloride 94 (*)    Glucose, Bld 116 (*)    GFR calc non Af Amer 52 (*)    GFR calc Af Amer 60 (*)    All other components within normal limits  HEMOGLOBIN - Abnormal; Notable for the following:    Hemoglobin 7.1 (*)    All other components within normal limits  CBC WITH DIFFERENTIAL - Abnormal; Notable for the following:    WBC 17.6 (*)    RBC 3.21 (*)    Hemoglobin 10.2 (*)    HCT 29.3 (*)    RDW 17.5 (*)    Neutrophils Relative % 90 (*)    Neutro Abs 15.9 (*)    Lymphocytes Relative 5 (*)    All other components within normal limits  GLUCOSE, CAPILLARY - Abnormal; Notable for the following:    Glucose-Capillary 115 (*)    All other components within normal limits  CBG MONITORING, ED  TYPE AND SCREEN  PREPARE RBC (CROSSMATCH)  PREPARE RBC (CROSSMATCH)    Imaging Review No results found.   EKG Interpretation None     Muse interface not working ECG: Sinus rhythm, rate 79, no significant change c/w prior ECG.  MDM   Final diagnoses:  Severe anemia  Syncope, unspecified syncope type    The patient appears reasonably stabilized for admission considering the current resources, flow, and capabilities available in the ED at this time, and I doubt any other [facility]  screening and/or treatment in the ED prior to admission.    JBabette Relic MD 04/03/14 [phone]

## 2014-04-02 NOTE — Telephone Encounter (Signed)
Very weak , like she did when she passed out before. I instructed pt to come in for la and IVF.

## 2014-04-02 NOTE — ED Notes (Signed)
Witnessed MD Bednar and hospitalist educating patient on benefits and risks of blood transfusion. Acknowledges understanding.

## 2014-04-02 NOTE — H&P (Addendum)
[facility]                                                                                    Patient Demographics  Kimberly [name], is a 73 y.o. female  CSN: [id number]  MRN: [id number]  DOB - 11-Jan-1941  Admit Date - 04/02/2014  Outpatient Primary MD for the patient is [name], MD   With History of -  Past Medical History  Diagnosis Date  . Hypertension   . Thyroid disease   . GERD (gastroesophageal reflux disease)   . Dyslipidemia   . Aortic insufficiency   . Mitral insufficiency   . History of nuclear stress test 02/26/2006    exercise myoview; normal pattern of perfusion; low risk scan   . PONV (postoperative nausea and vomiting)   . Heart murmur   . Hypothyroidism   . Cough     dry, endobronchial mass  . Pneumonia     pus bronchitis  . Arthritis   . Syncope     "states she has passed out a few times. dr is trying to find cause.last time when geeting ready to go home after video bronch/bx  . Shortness of breath     with exertion  . Anxiety     due to surgery   . Bronchitis   . Radiation 12/01/13-01/08/14    50.4 gray to right central chest  . Atrial fibrillation   . Squamous cell carcinoma of lung       Past Surgical History  Procedure Laterality Date  . Thyroidectomy  1973  . Dilation and curettage of uterus    . Carpal tunnel release Right 1988  . Rotator cuff repair  2006    ? side  . H/o met test w/pft  04/02/2012    low risk; peak VO2 77% predicted  . Cardiac catheterization  07/08/2008    normal coronaries  . Transthoracic echocardiogram  09/25/2012    EF 55-60%; mild LVH & mild concentric hypertrophy; mild AV regurg; RV systolic pressure increase consistent with mild pulm HTN  . Video bronchoscopy Bilateral 09/25/2013    Procedure: VIDEO BRONCHOSCOPY WITHOUT FLUORO;  Surgeon: Tanda Rockers, MD;  Location: [facility];  Service: Cardiopulmonary;  Laterality: Bilateral;  . Joint replacement  2003    thumb rt  . Knee  arthroscopy  12    rt meniscus  . Video bronchoscopy with endobronchial ultrasound N/A 10/30/2013    Procedure: VIDEO BRONCHOSCOPY WITH ENDOBRONCHIAL ULTRASOUND;  Surgeon: Melrose Nakayama, MD;  Location: [place];  Service: Thoracic;  Laterality: N/A;  . Mediastinoscopy N/A 10/30/2013    Procedure: MEDIASTINOSCOPY;  Surgeon: Melrose Nakayama, MD;  Location: [place];  Service: Thoracic;  Laterality: N/A;  . Eye surgery Bilateral   . Video assisted thoracoscopy (vats)/ lobectomy Right 11/13/2013    Procedure: VIDEO ASSISTED THORACOSCOPY (VATS) with mediastinal  biopsies;  Surgeon: Melrose Nakayama, MD;  Location: [place];  Service: Thoracic;  Laterality: Right;  RIGHT VATS,mediastinal biopsies    in for   Chief Complaint  Patient presents with  . Near Syncope     HPI  Kimberly [name]  is a 73  y.o. female, with past medical history significant for stage III lung cancer presenting to today with 3 days history of increasing weakness and syncopal episode that occurred today while standing in the bathroom. Patient lost consciousness for around 2 minutes but there was no trauma . no history of chest pains or shortness of breath , although reports mild dyspnea on exertion. Reports 2 episodes of vomiting today Her last chemotherapy episode was on 7/13 . Patient denies any blood in her stools or urine.    Review of Systems    In addition to the HPI above,  No Fever-chills, No Headache, No changes with Vision or hearing, No problems swallowing food or Liquids, No Chest pain, Cough or Shortness of Breath, No Abdominal pain,  reports chronic diarrhea, No Blood in stool or Urine, No dysuria, No new skin rashes or bruises, No recent weight gain or loss, No polyuria, polydypsia or polyphagia, No significant Mental Stressors.  A full 10 point Review of Systems was done, except as stated above, all other Review of Systems were negative.   Social History History  Substance Use Topics  .  Smoking status: Former Smoker -- 0.50 packs/day for 6 years    Types: Cigarettes    Quit date: 09/05/1967  . Smokeless tobacco: Never Used     Comment: occ wine  . Alcohol Use: Yes     Comment: occasional 1-2 drinks/month     Family History Family History  Problem Relation Age of Onset  . Lung cancer Mother   . Heart attack Father   . Heart failure Maternal Grandfather   . Heart attack Paternal Grandfather      Prior to Admission medications   Medication Sig Start Date End Date Taking? Authorizing Provider  albuterol (PROVENTIL HFA;VENTOLIN HFA) 108 (90 BASE) MCG/ACT inhaler Inhale 1 puff into the lungs every 6 (six) hours as needed for wheezing or shortness of breath.   Yes Historical Provider, MD  amiodarone (PACERONE) 200 MG tablet Take 200 mg by mouth every morning.    Yes Historical Provider, MD  atorvastatin (LIPITOR) 20 MG tablet Take 20 mg by mouth every morning.    Yes Historical Provider, MD  EPINEPHrine 0.3 mg/0.3 mL IJ SOAJ injection Inject 0.3 mg into the muscle once as needed (reaction to milk products).   Yes Historical Provider, MD  ibuprofen (ADVIL,MOTRIN) 200 MG tablet Take 200-400 mg by mouth every 6 (six) hours as needed for moderate pain.    Yes Historical Provider, MD  levothyroxine (SYNTHROID, LEVOTHROID) 125 MCG tablet Take 125 mcg by mouth daily before breakfast.   Yes Historical Provider, MD  loratadine (CLARITIN) 10 MG tablet Take 10 mg by mouth daily as needed for allergies. For 7 days after chemo   Yes Historical Provider, MD  losartan (COZAAR) 25 MG tablet Take 25 mg by mouth daily. 02/28/14  Yes Donne Hazel, MD  metoprolol succinate (TOPROL-XL) 25 MG 24 hr tablet Take 12.5 mg by mouth daily.   Yes Historical Provider, MD  montelukast (SINGULAIR) 10 MG tablet Take 10 mg by mouth daily. 04/15/13  Yes Historical Provider, MD  pantoprazole (PROTONIX) 40 MG tablet Take 40 mg by mouth 2 (two) times daily.    Yes Historical Provider, MD  prochlorperazine  (COMPAZINE) 10 MG tablet Take 10 mg by mouth every 6 (six) hours as needed for nausea or vomiting.   Yes Historical Provider, MD  ranitidine (ZANTAC) 300 MG tablet Take 300 mg by mouth at bedtime.   Yes  Historical Provider, MD    Allergies  Allergen Reactions  . Milk-Related Compounds Other (See Comments)    REACTION: GI upset, projectile vomiting    Physical Exam  Vitals  Blood pressure 127/55, pulse 79, temperature 99.3 F (37.4 C), temperature source Oral, resp. rate 23, SpO2 97.00%.   1. General  white American female, extremely pleasant, in no acute distress at the moment  2. Normal affect and insight, Not Suicidal or Homicidal, Awake Alert, Oriented X 3.  3. No F.N deficits, ALL C.Nerves Intact, Strength 5/5 all 4 extremities,   4. Ears and Eyes appear Normal, Conjunctivae  pale , PERRLA. Moist Oral Mucosa.  5. Supple Neck, No JVD, No cervical lymphadenopathy appriciated, No Carotid Bruits.  6. Symmetrical Chest wall movement, Good air movement bilaterally, CTAB.  7. RRR, No Gallops, Rubs or Murmurs, No Parasternal Heave.  8. Positive Bowel Sounds, Abdomen Soft, Non tender, No organomegaly appriciated,No rebound -guarding or rigidity.  9.  No Cyanosis, Normal Skin Turgor, No Skin Rash or Bruise.  10. Good muscle tone,  joints appear normal , no effusions, Normal ROM.  11. No Palpable Lymph Nodes in Neck or Axillae    Data Review  CBC  Recent Labs Lab 03/30/14 0848 04/02/14 1658  WBC 22.0* 31.5*  HGB 8.2* 5.4*  HCT 25.2* 15.9*  PLT 206 261  MCV 98.7 98.8  MCH 32.2 33.5  MCHC 32.6 34.0  RDW 17.8* 16.9*  LYMPHSABS 0.8*  --   MONOABS 1.2*  --   EOSABS 0.1  --   BASOSABS 0.1  --    ------------------------------------------------------------------------------------------------------------------  Chemistries   Recent Labs Lab 03/30/14 0848 04/02/14 1658  NA 136 132*  K 4.0 4.4  CL  --  94*  CO2 26 24  GLUCOSE 134 116*  BUN 10.2 14   CREATININE 1.1 1.06  CALCIUM 8.7 8.5  AST 15  --   ALT 12  --   ALKPHOS 125  --   BILITOT 0.48  --    ------------------------------------------------------------------------------------------------------------------ CrCl is unknown because both a height and weight (above a minimum accepted value) are required for this calculation. ------------------------------------------------------------------------------------------------------------------ No results found for this basename: TSH, T4TOTAL, FREET3, T3FREE, THYROIDAB,  in the last 72 hours   Coagulation profile No results found for this basename: INR, PROTIME,  in the last 168 hours ------------------------------------------------------------------------------------------------------------------- No results found for this basename: DDIMER,  in the last 72 hours -------------------------------------------------------------------------------------------------------------------  Cardiac Enzymes No results found for this basename: CK, CKMB, TROPONINI, MYOGLOBIN,  in the last 168 hours ------------------------------------------------------------------------------------------------------------------ No components found with this basename: POCBNP,    ---------------------------------------------------------------------------------------------------------------  Urinalysis    Component Value Date/Time   COLORURINE AMBER* 02/27/2014 1314   APPEARANCEUR CLEAR 02/27/2014 1314   LABSPEC 1.012 02/27/2014 1314   PHURINE 5.5 02/27/2014 1314   GLUCOSEU NEGATIVE 02/27/2014 1314   HGBUR NEGATIVE 02/27/2014 1314   BILIRUBINUR NEGATIVE 02/27/2014 1314   KETONESUR NEGATIVE 02/27/2014 1314   PROTEINUR NEGATIVE 02/27/2014 1314   UROBILINOGEN 0.2 02/27/2014 1314   NITRITE NEGATIVE 02/27/2014 1314   LEUKOCYTESUR NEGATIVE 02/27/2014 1314     ----------------------------------------------------------------------------------------------------------------   Imaging results:   No results found.    Assessment & Plan   1.Symptomatic  Anemia: Place him telemetry 2. Syncope, related to her anemia. This is recurrent. 3. Squamous cell carcinoma of the lung stage III status post 6 cycles of chemotherapy last one was on 7/13 and right lobectomy By VATS 4. History of atrial fibrillation 5. Leukocytosis probably related to  medications (?Neupogen) please follow CBC in a.m.  Plan  Blood transfusion x2 units for now and type and crossmatch x2 more units for possible repeat transfusion Follow CBC in a.m. Oncology consult if necessary  DVT Prophylaxis  SCDs  AM Labs Ordered, also please review Full Orders  Code Status  full  Disposition Plan:  home  Time spent in minutes : 33 minutes  Condition GUARDED   '@SIGNATURE' @

## 2014-04-02 NOTE — Telephone Encounter (Signed)
Received call from pt stating that she passed out & EMS is there & she wants to know if she should still come here for IVF.  She states that she was sitting down & got up to go to the BR & passed out.  She states she did not get hurt & a friend was there with her.  Talked with EMS/[facility] & they will transport to ED instead of to the cancer center after she is evaluated. Notified Dr Worthy Flank nurse & infusion room.

## 2014-04-02 NOTE — ED Notes (Signed)
Per EMS pt hx of lung cancer with syncopal episode. Pt reports nausea and vomiting yesterday but relates it to french toast eaten. Pt denies nausea and vomiting today. Pt reports diarrhea episode and syncopal during event. Pt reports on and off syncope for months. Pt receives chemo every 3 weeks for lung cancer.

## 2014-04-02 NOTE — Progress Notes (Signed)
har  done

## 2014-04-02 NOTE — ED Notes (Signed)
Pt brought to floor with blood consent and all necessary paperwork.

## 2014-04-03 DIAGNOSIS — R55 Syncope and collapse: Secondary | ICD-10-CM

## 2014-04-03 DIAGNOSIS — D72829 Elevated white blood cell count, unspecified: Secondary | ICD-10-CM

## 2014-04-03 LAB — CBC WITH DIFFERENTIAL/PLATELET
BASOS ABS: 0 10*3/uL (ref 0.0–0.1)
Basophils Relative: 0 % (ref 0–1)
EOS ABS: 0.1 10*3/uL (ref 0.0–0.7)
Eosinophils Relative: 1 % (ref 0–5)
HCT: 29.3 % — ABNORMAL LOW (ref 36.0–46.0)
Hemoglobin: 10.2 g/dL — ABNORMAL LOW (ref 12.0–15.0)
Lymphocytes Relative: 5 % — ABNORMAL LOW (ref 12–46)
Lymphs Abs: 0.8 10*3/uL (ref 0.7–4.0)
MCH: 31.8 pg (ref 26.0–34.0)
MCHC: 34.8 g/dL (ref 30.0–36.0)
MCV: 91.3 fL (ref 78.0–100.0)
MONO ABS: 0.8 10*3/uL (ref 0.1–1.0)
Monocytes Relative: 5 % (ref 3–12)
Neutro Abs: 15.9 10*3/uL — ABNORMAL HIGH (ref 1.7–7.7)
Neutrophils Relative %: 90 % — ABNORMAL HIGH (ref 43–77)
PLATELETS: 199 10*3/uL (ref 150–400)
RBC: 3.21 MIL/uL — ABNORMAL LOW (ref 3.87–5.11)
RDW: 17.5 % — AB (ref 11.5–15.5)
WBC: 17.6 10*3/uL — AB (ref 4.0–10.5)

## 2014-04-03 LAB — GLUCOSE, CAPILLARY: Glucose-Capillary: 115 mg/dL — ABNORMAL HIGH (ref 70–99)

## 2014-04-03 NOTE — Discharge Summary (Signed)
Physician Discharge Summary  Kimberly [name] AYT:[id number] DOB: 1940-12-10 DOA: 04/02/2014  PCP: [name], MD  Admit date: 04/02/2014 Discharge date: 04/03/2014  Recommendations for Outpatient Follow-up:  1.  Follow up with Oncology next week for repeat CBC.    Discharge Diagnoses:  Principal Problem:   Symptomatic anemia Active Problems:   Anemia   Discharge Condition: Stable, improved  Diet recommendation: Regular  Wt Readings from Last 3 Encounters:  04/02/14 67.9 kg (149 lb 11.1 oz)  03/16/14 69.673 kg (153 lb 9.6 oz)  02/27/14 65.772 kg (145 lb)    History of present illness:   73 year old female with stage III lung cancer, atrial fibrillation, hypertension, hypothyroidism, GERD who presents with syncope and found to be anemic.  She had 3 days of increasing weakness and finally culminated with a syncopal episode while standing in the bathroom. She stated she thought she lost consciousness for approximately 2 minutes. The last chemotherapy was on 7/13. No obvious bleeding.  Hospital Course:   Syncope, likely secondary to anemia. After blood transfusion, as she was able to stand up and ambulate without difficulty. Her weakness also improved dramatically with transfusion.    Symptomatic anemia with hemoglobin of 5.4 on admission, but this may have been spurious.  Her repeat hgb was 7.1. She was transfused 2 packed red blood cells and her subsequent hemoglobin was 10.2. She did not have any evidence of bleeding and most likely her anemia is secondary to her chemotherapy. She did not thrombocytopenia which would have suggested malignancy associated DIC.    Squamous cell carcinoma of the lung, stage III, status post 6 cycles of chemotherapy, last chemotherapy 7/13. She has also undergone right lobectomy via vats procedure. Followup with oncology.  Atrial fibrillation, current SR, continue amiodarone, BB.  Not on AC.  Defer to PCP.    HTN/HLD, blood pressure stable,  continued home medications.  Leukocytosis, may be secondary to acute anemia plus neulasta and trending down with blood transfusion.  No evidence of active infection.  F/u with oncology  Procedures:  Transfused   Consultations:  None  Discharge Exam: Filed Vitals:   04/03/14 0926  BP: 132/49  Pulse: 63  Temp:   Resp:    Filed Vitals:   04/03/14 0310 04/03/14 0410 04/03/14 0510 04/03/14 0926  BP: 122/54 113/64 113/61 132/49  Pulse: 60 61 61 63  Temp: 98 F (36.7 C) 97.5 F (36.4 C) 98 F (36.7 C)   TempSrc: Oral Oral Oral   Resp: 16 15 16    Height:      Weight:      SpO2: 97% 100% 98%     General: alopecia, CF, NAD HEENT:  NCAT, MMM Cardiovascular: RRR, 2/6 murmur at RUSB, no rubs or gallops Respiratory: CTAB, no increased WOB ABD:  NABS, soft, ND/NT MSK:  No LEE  Discharge Instructions       Discharge Instructions   Call MD for:  difficulty breathing, headache or visual disturbances    Complete by:  As directed      Call MD for:  extreme fatigue    Complete by:  As directed      Call MD for:  hives    Complete by:  As directed      Call MD for:  persistant dizziness or light-headedness    Complete by:  As directed      Call MD for:  persistant nausea and vomiting    Complete by:  As directed  Call MD for:  severe uncontrolled pain    Complete by:  As directed      Call MD for:  temperature >100.4    Complete by:  As directed      Diet general    Complete by:  As directed      Discharge instructions    Complete by:  As directed   You were hospitalized with anemia and have been transfused blood.   Your hemoglobin at the time of discharge is 10mg /dl.  Please follow up with Dr. Julien Nordmann at your next scheduled appointment.  IF you notice any bleeding, please call his office right away or return to the hospital.     Increase activity slowly    Complete by:  As directed             Medication List    STOP taking these medications       losartan  25 MG tablet  Commonly known as:  COZAAR      TAKE these medications       albuterol 108 (90 BASE) MCG/ACT inhaler  Commonly known as:  PROVENTIL HFA;VENTOLIN HFA  Inhale 1 puff into the lungs every 6 (six) hours as needed for wheezing or shortness of breath.     amiodarone 200 MG tablet  Commonly known as:  PACERONE  Take 200 mg by mouth every morning.     atorvastatin 20 MG tablet  Commonly known as:  LIPITOR  Take 20 mg by mouth every morning.     EPINEPHrine 0.3 mg/0.3 mL Soaj injection  Commonly known as:  EPI-PEN  Inject 0.3 mg into the muscle once as needed (reaction to milk products).     ibuprofen 200 MG tablet  Commonly known as:  ADVIL,MOTRIN  Take 200-400 mg by mouth every 6 (six) hours as needed for moderate pain.     levothyroxine 125 MCG tablet  Commonly known as:  SYNTHROID, LEVOTHROID  Take 125 mcg by mouth daily before breakfast.     loratadine 10 MG tablet  Commonly known as:  CLARITIN  Take 10 mg by mouth daily as needed for allergies. For 7 days after chemo     metoprolol succinate 25 MG 24 hr tablet  Commonly known as:  TOPROL-XL  Take 12.5 mg by mouth daily.     montelukast 10 MG tablet  Commonly known as:  SINGULAIR  Take 10 mg by mouth daily.     pantoprazole 40 MG tablet  Commonly known as:  PROTONIX  Take 40 mg by mouth 2 (two) times daily.     prochlorperazine 10 MG tablet  Commonly known as:  COMPAZINE  Take 10 mg by mouth every 6 (six) hours as needed for nausea or vomiting.     ranitidine 300 MG tablet  Commonly known as:  ZANTAC  Take 300 mg by mouth at bedtime.       Follow-up Information   Follow up with [name], MD. Schedule an appointment as soon as possible for a visit in 1 month. (As needed)    Specialty:  Internal Medicine   Contact information:   [address] [place] [address] [phone]       Follow up with Eilleen Kempf., MD. (already scheduled appointment)     Specialty:  Oncology   Contact information:   [place] [address] [phone]        The results of significant diagnostics from this hospitalization (including imaging,  microbiology, ancillary and laboratory) are listed below for reference.    Significant Diagnostic Studies: No results found.  Microbiology: No results found for this or any previous visit (from the past 240 hour(s)).   Labs: Basic Metabolic Panel:  Recent Labs Lab 03/30/14 0848 04/02/14 1658  NA 136 132*  K 4.0 4.4  CL  --  94*  CO2 26 24  GLUCOSE 134 116*  BUN 10.2 14  CREATININE 1.1 1.06  CALCIUM 8.7 8.5   Liver Function Tests:  Recent Labs Lab 03/30/14 0848  AST 15  ALT 12  ALKPHOS 125  BILITOT 0.48  PROT 5.8*  ALBUMIN 2.9*   No results found for this basename: LIPASE, AMYLASE,  in the last 168 hours No results found for this basename: AMMONIA,  in the last 168 hours CBC:  Recent Labs Lab 03/30/14 0848 04/02/14 1658 04/02/14 2048 04/03/14 0700  WBC 22.0* 31.5*  --  17.6*  NEUTROABS 19.9*  --   --  15.9*  HGB 8.2* 5.4* 7.1* 10.2*  HCT 25.2* 15.9*  --  29.3*  MCV 98.7 98.8  --  91.3  PLT 206 261  --  199   Cardiac Enzymes: No results found for this basename: CKTOTAL, CKMB, CKMBINDEX, TROPONINI,  in the last 168 hours BNP: BNP (last 3 results)  Recent Labs  02/07/14 2123  PROBNP 2388.0*   CBG:  Recent Labs Lab 04/02/14 1655  GLUCAP 115*    Time coordinating discharge: 35 minutes  Signed:  Sueo Cullen  [facility] 04/03/2014, 2:00 PM

## 2014-04-03 NOTE — Care Management Note (Signed)
    Page 1 of 1   04/03/2014     2:17:03 PM CARE MANAGEMENT NOTE 04/03/2014  Patient:  Kimberly [name], Kimberly [name]   Account Number:  [id number]  Date Initiated:  04/03/2014  Documentation initiated by:  Dessa Phi  Subjective/Objective Assessment:   73 Y/O F ADMITTED W/ANEMIA.     Action/Plan:   FROM HOME.   Anticipated DC Date:  04/03/2014   Anticipated DC Plan:  [place]  CM consult      Choice offered to / List presented to:             Status of service:  Completed, signed off Medicare Important Message given?  NA - LOS <3 / Initial given by admissions (If response is "NO", the following Medicare IM given date fields will be blank) Date Medicare IM given:   Medicare IM given by:   Date Additional Medicare IM given:   Additional Medicare IM given by:    Discharge Disposition:  HOME/SELF CARE  Per UR Regulation:  Reviewed for med. necessity/level of care/duration of stay  If discussed at [place] of Stay Meetings, dates discussed:    Comments:  04/03/14 Francene Mcerlean RN,BSN NCM [phone] PT-N/A.D/C HOME NO NEEDS OR ORDERS.

## 2014-04-03 NOTE — Evaluation (Signed)
Physical Therapy One Time Evaluation Patient Details Name: Kimberly [name] MRN: [id number] DOB: [birth year] Today's Date: 04/03/2014   History of Present Illness  73 y.o. female with past medical history significant for stage III lung cancer undergoing chemotherapy admitted with increasing weakness and syncopal episode.  Pt found to have low Hgb and s/p PRBCs.  Clinical Impression  Patient evaluated by Physical Therapy with no further acute PT needs identified. All education has been completed and the patient has no further questions.  Pt currently feels at baseline.  Pt reports no dizziness and no unsteadiness observed with mobility. See below for any follow-up Physial Therapy or equipment needs. PT is signing off. Thank you for this referral.      Follow Up Recommendations No PT follow up    Equipment Recommendations  None recommended by PT    Recommendations for Other Services       Precautions / Restrictions Precautions Precautions: Fall      Mobility  Bed Mobility Overal bed mobility: Modified Independent                Transfers Overall transfer level: Modified independent                  Ambulation/Gait Ambulation/Gait assistance: Supervision;Modified independent (Device/Increase time) Ambulation Distance (Feet): 400 Feet Assistive device: None Gait Pattern/deviations: WFL(Within Functional Limits)     General Gait Details: no unsteady gait observed, pt denies dizziness, reports being at baseline  Stairs            Wheelchair Mobility    Modified Rankin (Stroke Patients Only)       Balance Overall balance assessment: Modified Independent                           High level balance activites: Turns;Sudden stops;Head turns;Backward walking High Level Balance Comments: performed above activities without difficulty             Pertinent Vitals/Pain No complaints    Home Living Family/patient expects to be  discharged to:: Private residence Living Arrangements: Alone   Type of Home: House       Home Layout: One level Home Equipment: None      Prior Function Level of Independence: Independent               Hand Dominance        Extremity/Trunk Assessment               Lower Extremity Assessment: Overall WFL for tasks assessed      Cervical / Trunk Assessment: Normal  Communication   Communication: No difficulties  Cognition Arousal/Alertness: Awake/alert Behavior During Therapy: WFL for tasks assessed/performed Overall Cognitive Status: Within Functional Limits for tasks assessed                      General Comments      Exercises        Assessment/Plan    PT Assessment Patent does not need any further PT services  PT Diagnosis     PT Problem List    PT Treatment Interventions     PT Goals (Current goals can be found in the Care Plan section) Acute Rehab PT Goals PT Goal Formulation: No goals set, d/c therapy    Frequency     Barriers to discharge        Co-evaluation  End of Session   Activity Tolerance: Patient tolerated treatment well Patient left: in bed;with call bell/phone within reach;with bed alarm set Nurse Communication: Mobility status    Functional Assessment Tool Used: clinical judgement Functional Limitation: Mobility: Walking and moving around Mobility: Walking and Moving Around Current Status [phone]): 0 percent impaired, limited or restricted Mobility: Walking and Moving Around Goal Status [phone]): 0 percent impaired, limited or restricted Mobility: Walking and Moving Around Discharge Status [phone]): 0 percent impaired, limited or restricted    Time: 9373-4287 PT Time Calculation (min): 10 min   Charges:   PT Evaluation $Initial PT Evaluation Tier I: 1 Procedure PT Treatments $Gait Training: 8-22 mins   PT G Codes:   Functional Assessment Tool Used: clinical judgement Functional  Limitation: Mobility: Walking and moving around    [place] E 04/03/2014, 12:45 PM Carmelia Bake, PT, DPT 04/03/2014 Pager: [phone]

## 2014-04-03 NOTE — Progress Notes (Signed)
Utilization Review Completed.Donne Anon T7/31/2015

## 2014-04-06 ENCOUNTER — Ambulatory Visit (HOSPITAL_BASED_OUTPATIENT_CLINIC_OR_DEPARTMENT_OTHER): Payer: Medicare Other | Admitting: Physician Assistant

## 2014-04-06 ENCOUNTER — Ambulatory Visit: Payer: Medicare Other | Admitting: Nutrition

## 2014-04-06 ENCOUNTER — Other Ambulatory Visit (HOSPITAL_BASED_OUTPATIENT_CLINIC_OR_DEPARTMENT_OTHER): Payer: Medicare Other

## 2014-04-06 ENCOUNTER — Encounter: Payer: Self-pay | Admitting: Physician Assistant

## 2014-04-06 ENCOUNTER — Ambulatory Visit (HOSPITAL_BASED_OUTPATIENT_CLINIC_OR_DEPARTMENT_OTHER): Payer: Medicare Other

## 2014-04-06 ENCOUNTER — Telehealth: Payer: Self-pay | Admitting: Internal Medicine

## 2014-04-06 VITALS — BP 181/55 | HR 65 | Temp 98.5°F | Resp 18 | Ht 59.0 in | Wt 148.6 lb

## 2014-04-06 DIAGNOSIS — C342 Malignant neoplasm of middle lobe, bronchus or lung: Secondary | ICD-10-CM

## 2014-04-06 DIAGNOSIS — Z5111 Encounter for antineoplastic chemotherapy: Secondary | ICD-10-CM

## 2014-04-06 DIAGNOSIS — C3490 Malignant neoplasm of unspecified part of unspecified bronchus or lung: Secondary | ICD-10-CM

## 2014-04-06 DIAGNOSIS — G609 Hereditary and idiopathic neuropathy, unspecified: Secondary | ICD-10-CM

## 2014-04-06 DIAGNOSIS — D649 Anemia, unspecified: Secondary | ICD-10-CM

## 2014-04-06 LAB — COMPREHENSIVE METABOLIC PANEL (CC13)
ALBUMIN: 2.5 g/dL — AB (ref 3.5–5.0)
ALT: 10 U/L (ref 0–55)
AST: 14 U/L (ref 5–34)
Alkaline Phosphatase: 101 U/L (ref 40–150)
Anion Gap: 9 mEq/L (ref 3–11)
BUN: 12 mg/dL (ref 7.0–26.0)
CHLORIDE: 105 meq/L (ref 98–109)
CO2: 24 meq/L (ref 22–29)
Calcium: 8.6 mg/dL (ref 8.4–10.4)
Creatinine: 0.8 mg/dL (ref 0.6–1.1)
GLUCOSE: 125 mg/dL (ref 70–140)
POTASSIUM: 3.7 meq/L (ref 3.5–5.1)
Sodium: 139 mEq/L (ref 136–145)
Total Bilirubin: 0.46 mg/dL (ref 0.20–1.20)
Total Protein: 5.7 g/dL — ABNORMAL LOW (ref 6.4–8.3)

## 2014-04-06 LAB — TYPE AND SCREEN
ABO/RH(D): O POS
ANTIBODY SCREEN: NEGATIVE
Unit division: 0
Unit division: 0
Unit division: 0
Unit division: 0

## 2014-04-06 LAB — CBC WITH DIFFERENTIAL/PLATELET
BASO%: 0.3 % (ref 0.0–2.0)
BASOS ABS: 0 10*3/uL (ref 0.0–0.1)
EOS%: 7.3 % — ABNORMAL HIGH (ref 0.0–7.0)
Eosinophils Absolute: 0.7 10*3/uL — ABNORMAL HIGH (ref 0.0–0.5)
HCT: 34.2 % — ABNORMAL LOW (ref 34.8–46.6)
HGB: 11.5 g/dL — ABNORMAL LOW (ref 11.6–15.9)
LYMPH%: 6.5 % — ABNORMAL LOW (ref 14.0–49.7)
MCH: 31.6 pg (ref 25.1–34.0)
MCHC: 33.6 g/dL (ref 31.5–36.0)
MCV: 93.9 fL (ref 79.5–101.0)
MONO#: 0.7 10*3/uL (ref 0.1–0.9)
MONO%: 7.5 % (ref 0.0–14.0)
NEUT%: 78.4 % — ABNORMAL HIGH (ref 38.4–76.8)
NEUTROS ABS: 7.1 10*3/uL — AB (ref 1.5–6.5)
PLATELETS: 258 10*3/uL (ref 145–400)
RBC: 3.64 10*6/uL — AB (ref 3.70–5.45)
RDW: 17.7 % — AB (ref 11.2–14.5)
WBC: 9.1 10*3/uL (ref 3.9–10.3)
lymph#: 0.6 10*3/uL — ABNORMAL LOW (ref 0.9–3.3)

## 2014-04-06 LAB — HOLD TUBE, BLOOD BANK

## 2014-04-06 MED ORDER — FAMOTIDINE IN NACL 20-0.9 MG/50ML-% IV SOLN
20.0000 mg | Freq: Once | INTRAVENOUS | Status: AC
  Administered 2014-04-06: 20 mg via INTRAVENOUS

## 2014-04-06 MED ORDER — SODIUM CHLORIDE 0.9 % IV SOLN
327.2000 mg | Freq: Once | INTRAVENOUS | Status: AC
  Administered 2014-04-06: 330 mg via INTRAVENOUS
  Filled 2014-04-06: qty 33

## 2014-04-06 MED ORDER — FAMOTIDINE IN NACL 20-0.9 MG/50ML-% IV SOLN
INTRAVENOUS | Status: AC
  Filled 2014-04-06: qty 50

## 2014-04-06 MED ORDER — PROCHLORPERAZINE MALEATE 10 MG PO TABS: 10.0000 mg | ORAL_TABLET | Freq: Four times a day (QID) | ORAL | Status: DC | PRN

## 2014-04-06 MED ORDER — DEXAMETHASONE SODIUM PHOSPHATE 20 MG/5ML IJ SOLN
INTRAMUSCULAR | Status: AC
Start: 2014-04-06 — End: 2014-04-06
  Filled 2014-04-06: qty 5

## 2014-04-06 MED ORDER — DEXAMETHASONE SODIUM PHOSPHATE 20 MG/5ML IJ SOLN
20.0000 mg | Freq: Once | INTRAMUSCULAR | Status: AC
  Administered 2014-04-06: 20 mg via INTRAVENOUS

## 2014-04-06 MED ORDER — ONDANSETRON 16 MG/50ML IVPB (CHCC)
16.0000 mg | Freq: Once | INTRAVENOUS | Status: AC
  Administered 2014-04-06: 16 mg via INTRAVENOUS

## 2014-04-06 MED ORDER — DIPHENHYDRAMINE HCL 50 MG/ML IJ SOLN
50.0000 mg | Freq: Once | INTRAMUSCULAR | Status: AC
  Administered 2014-04-06: 50 mg via INTRAVENOUS

## 2014-04-06 MED ORDER — SODIUM CHLORIDE 0.9 % IV SOLN
Freq: Once | INTRAVENOUS | Status: AC
  Administered 2014-04-06: 11:00:00 via INTRAVENOUS

## 2014-04-06 MED ORDER — NYSTATIN 100000 UNIT/ML MT SUSP: 5.0000 mL | Freq: Four times a day (QID) | OROMUCOSAL | Status: DC

## 2014-04-06 MED ORDER — PACLITAXEL CHEMO INJECTION 300 MG/50ML
150.0000 mg/m2 | Freq: Once | INTRAVENOUS | Status: AC
  Administered 2014-04-06: 252 mg via INTRAVENOUS
  Filled 2014-04-06: qty 42

## 2014-04-06 MED ORDER — DIPHENHYDRAMINE HCL 50 MG/ML IJ SOLN
INTRAMUSCULAR | Status: AC
  Filled 2014-04-06: qty 1

## 2014-04-06 MED ORDER — ONDANSETRON 16 MG/50ML IVPB (CHCC)
INTRAVENOUS | Status: AC
  Filled 2014-04-06: qty 16

## 2014-04-06 NOTE — Progress Notes (Addendum)
[facility] [phone]   Fax:([phone]  OFFICE PROGRESS NOTE  [name], MD [address] [place] [address]  DIAGNOSIS: Unresectable, likely a stage IIIA (T2a, N2, M0) non-small cell lung cancer, squamous cell carcinoma diagnosed in February 2015.  PRIOR THERAPY:  1) On 11/13/2013 the patient underwent exploratory right VATS with mediastinal biopsy under the care of Dr. Roxan Hockey. 2) Concurrent chemoradiation with weekly carboplatin for AUC of 2 and paclitaxel 45 mg/M2, status post 6 cycles with partial response. First cycle was given on 12/01/2013.   CURRENT THERAPY: Consolidation chemotherapy with carboplatin for AUC of 5 and paclitaxel 175 mg/M2 every 3 weeks with Neulasta support. First dose 02/23/2014. Status post 2 cycles  INTERVAL HISTORY: Kimberly [name] 73 y.o. female returns to the clinic today for followup visit. She is accompanied today by her daughter-in-law Juliann Pulse. She reports that she was hospitalized twice for her "dehydration" after this last cycle of chemotherapy. She is scheduled for IV fluids on 04/09/2014 and requests to be scheduled for additional IV fluids next week to avoid hospitalization. Should her request a refill for her Compazine. She was admitted after the first cycle of her systemic chemotherapy to [facility] with syncopal episode secondary to orthostatic hypotension.  She also had significant neutropenia after the first cycle. The patient is feeling fine today with no specific complaints except for mild fatigue. She denied having any significant nausea or vomiting, no fever or chills. She denied having any chest pain but continues to have shortness of breath with exertion with no hemoptysis. She continues to have mild numbness and tingling is in her fingers and toes. She is here today to start cycle #3.   MEDICAL HISTORY: Past Medical History  Diagnosis Date  . Hypertension     . Thyroid disease   . GERD (gastroesophageal reflux disease)   . Dyslipidemia   . Aortic insufficiency   . Mitral insufficiency   . History of nuclear stress test 02/26/2006    exercise myoview; normal pattern of perfusion; low risk scan   . PONV (postoperative nausea and vomiting)   . Heart murmur   . Hypothyroidism   . Cough     dry, endobronchial mass  . Pneumonia     pus bronchitis  . Arthritis   . Syncope     "states she has passed out a few times. dr is trying to find cause.last time when geeting ready to go home after video bronch/bx  . Shortness of breath     with exertion  . Anxiety     due to surgery   . Bronchitis   . Radiation 12/01/13-01/08/14    50.4 gray to right central chest  . Atrial fibrillation   . Squamous cell carcinoma of lung     ALLERGIES:  is allergic to milk-related compounds.  MEDICATIONS:  Current Outpatient Prescriptions  Medication Sig Dispense Refill  . amiodarone (PACERONE) 200 MG tablet Take 200 mg by mouth daily.      [name] atorvastatin (LIPITOR) 20 MG tablet Take 20 mg by mouth every morning.       [name] ibuprofen (ADVIL,MOTRIN) 200 MG tablet Take 200-400 mg by mouth every 6 (six) hours as needed for moderate pain.       [name] levothyroxine (SYNTHROID, LEVOTHROID) 125 MCG tablet Take 125 mcg by mouth daily before breakfast.      . loratadine (CLARITIN) 10 MG tablet Take 10 mg by mouth daily  as needed for allergies. For 7 days after chemo      . metoprolol succinate (TOPROL-XL) 25 MG 24 hr tablet Take 12.5 mg by mouth daily.      . montelukast (SINGULAIR) 10 MG tablet Take 10 mg by mouth daily.      . pantoprazole (PROTONIX) 40 MG tablet Take 40 mg by mouth 2 (two) times daily.       . prochlorperazine (COMPAZINE) 10 MG tablet Take 1 tablet (10 mg total) by mouth every 6 (six) hours as needed for nausea or vomiting.  30 tablet  1  . ranitidine (ZANTAC) 300 MG tablet Take 300 mg by mouth at bedtime.      [name] albuterol (PROVENTIL HFA;VENTOLIN HFA) 108 (90  BASE) MCG/ACT inhaler Inhale 1 puff into the lungs every 6 (six) hours as needed for wheezing or shortness of breath.      . EPINEPHrine 0.3 mg/0.3 mL IJ SOAJ injection Inject 0.3 mg into the muscle once as needed (reaction to milk products).      . losartan (COZAAR) 25 MG tablet       . nystatin (MYCOSTATIN) 100000 UNIT/ML suspension Take 5 mLs (500,000 Units total) by mouth 4 (four) times daily.  240 mL  0   No current facility-administered medications for this visit.    SURGICAL HISTORY:  Past Surgical History  Procedure Laterality Date  . Thyroidectomy  1973  . Dilation and curettage of uterus    . Carpal tunnel release Right 1988  . Rotator cuff repair  2006    ? side  . H/o met test w/pft  04/02/2012    low risk; peak VO2 77% predicted  . Cardiac catheterization  07/08/2008    normal coronaries  . Transthoracic echocardiogram  09/25/2012    EF 55-60%; mild LVH & mild concentric hypertrophy; mild AV regurg; RV systolic pressure increase consistent with mild pulm HTN  . Video bronchoscopy Bilateral 09/25/2013    Procedure: VIDEO BRONCHOSCOPY WITHOUT FLUORO;  Surgeon: Tanda Rockers, MD;  Location: [facility];  Service: Cardiopulmonary;  Laterality: Bilateral;  . Joint replacement  2003    thumb rt  . Knee arthroscopy  12    rt meniscus  . Video bronchoscopy with endobronchial ultrasound N/A 10/30/2013    Procedure: VIDEO BRONCHOSCOPY WITH ENDOBRONCHIAL ULTRASOUND;  Surgeon: Melrose Nakayama, MD;  Location: [facility];  Service: Thoracic;  Laterality: N/A;  . Mediastinoscopy N/A 10/30/2013    Procedure: MEDIASTINOSCOPY;  Surgeon: Melrose Nakayama, MD;  Location: [facility];  Service: Thoracic;  Laterality: N/A;  . Eye surgery Bilateral   . Video assisted thoracoscopy (vats)/ lobectomy Right 11/13/2013    Procedure: VIDEO ASSISTED THORACOSCOPY (VATS) with mediastinal  biopsies;  Surgeon: Melrose Nakayama, MD;  Location: [facility];  Service: Thoracic;  Laterality: Right;  RIGHT  VATS,mediastinal biopsies    REVIEW OF SYSTEMS:  Constitutional: positive for fatigue Eyes: negative Ears, nose, mouth, throat, and face: negative Respiratory: positive for cough and dyspnea on exertion Cardiovascular: negative Gastrointestinal: negative Genitourinary:negative Integument/breast: negative Hematologic/lymphatic: negative Musculoskeletal:negative Neurological: negative Behavioral/Psych: negative Endocrine: negative Allergic/Immunologic: negative   PHYSICAL EXAMINATION: General appearance: alert, cooperative, fatigued and no distress Head: Normocephalic, without obvious abnormality, atraumatic Neck: no adenopathy, no JVD, supple, symmetrical, trachea midline and thyroid not enlarged, symmetric, no tenderness/mass/nodules Lymph nodes: Cervical, supraclavicular, and axillary nodes normal. Resp: clear to auscultation bilaterally Back: symmetric, no curvature. ROM normal. No CVA tenderness. Cardio: regular rate and rhythm, S1, S2 normal, no murmur, click,  rub or gallop GI: soft, non-tender; bowel sounds normal; no masses,  no organomegaly Extremities: extremities normal, atraumatic, no cyanosis or edema Neurologic: Alert and oriented X 3, normal strength and tone. Normal symmetric reflexes. Normal coordination and gait Mouth: Reveals thrush  ECOG PERFORMANCE STATUS: 1 - Symptomatic but completely ambulatory  Blood pressure 181/55, pulse 65, temperature 98.5 F (36.9 C), temperature source Oral, resp. rate 18, height _0  (1.499 m), weight 148 lb 9.6 oz (67.405 kg), SpO2 98.00%.  LABORATORY DATA: Lab Results  Component Value Date   WBC 9.1 04/06/2014   HGB 11.5* 04/06/2014   HCT 34.2* 04/06/2014   MCV 93.9 04/06/2014   PLT 258 04/06/2014      Chemistry      Component Value Date/Time   NA 139 04/06/2014 0954   NA 132* 04/02/2014 1658   K 3.7 04/06/2014 0954   K 4.4 04/02/2014 1658   CL 94* 04/02/2014 1658   CO2 24 04/06/2014 0954   CO2 24 04/02/2014 1658   BUN 12.0  04/06/2014 0954   BUN 14 04/02/2014 1658   CREATININE 0.8 04/06/2014 0954   CREATININE 1.06 04/02/2014 1658      Component Value Date/Time   CALCIUM 8.6 04/06/2014 0954   CALCIUM 8.5 04/02/2014 1658   ALKPHOS 101 04/06/2014 0954   ALKPHOS 81 03/23/2014 0927   AST 14 04/06/2014 0954   AST 16 03/23/2014 0927   ALT 10 04/06/2014 0954   ALT 20 03/23/2014 0927   BILITOT 0.46 04/06/2014 0954   BILITOT 0.5 03/23/2014 0927       RADIOGRAPHIC STUDIES:  ASSESSMENT AND PLAN: This is a very pleasant 73 years old white female with unresectable stage IIIa non-small cell lung cancer completed a course of concurrent chemoradiation with weekly carboplatin and paclitaxel status post 6 cycles.  She is currently undergoing consolidation chemotherapy with carboplatin for AUC of 5 and paclitaxel 175 mg/M2 status post 2 cycles. The first cycle was complicated with significant neutropenia in addition to syncopal episode secondary to orthostatic hypotension. She had subsequent admissions for dehydration requiring IV fluids. Patient was discussed with also seen by Dr. Julien Nordmann. She'll proceed with cycle #3 of her consolidation chemotherapy however we will dose reduce the carboplatin to an AUC of 4 and the paclitaxel 2 150 mg per meter squared and she will proceed with Neulasta support. She will continue with weekly labs as scheduled. She'll followup in 3 weeks with a restaging CT scan of the chest with contrast to reevaluate her disease. She'll continue with weekly labs as scheduled. For the oral candidiasis a prescription for nystatin swish and spit was sent to her pharmacy of record via E. scribe as well as a refill for her Compazine. She was prescribed nystatin as the fluconazole was incompatible with her amiodarone. 2 proceed with IV fluids as scheduled on 04/09/2014 I will arrange for her to receive IV fluids on 04/16/2014 as well if needed.   She was advised to call immediately if she has any concerning symptoms in the  interval. The patient voices understanding of current disease status and treatment options and is in agreement with the current care plan.  All questions were answered. The patient knows to call the clinic with any problems, questions or concerns. We can certainly see the patient much sooner if necessary.  Carlton Adam PA-C  ADDENDUM:  Hematology/Oncology Attending:  I had a face to face encounter with the patient today. I recommended her care plan. This is a very  pleasant 73 years old white female with unresectable stage IIIa non-small cell lung cancer status post concurrent chemoradiation and she is currently undergoing consolidation chemotherapy with reduced dose carboplatin and paclitaxel is status post 2 cycles. She is tolerating her treatment well except for the increasing fatigue and weakness as well as dehydration and neutropenia. 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 I recommended for her to proceed with cycle #3 today as scheduled but I would reduce the dose of carboplatin to AUC of 4 and paclitaxel to 150 mg/M2. She would come back for followup visit in 3 weeks with repeat CT scan of the chest for evaluation of her disease. She was advised to call immediately if she has any concerning symptoms in the interval.  Disclaimer: This note was dictated with voice recognition software. Similar sounding words can inadvertently be transcribed and may not be corrected upon review. Eilleen Kempf., MD 04/08/2014

## 2014-04-06 NOTE — Progress Notes (Signed)
Patient is struggling with consuming adequate fluid.  She does not tolerate most oral nutrition supplements.  States she has been diagnosed with thrush.  Weight decreased and documented as 148.6 pounds August 3 from 153.6 pounds July 13.  Patient consuming small meals but struggles to consume snacks.  Nutrition diagnosis: Unintended weight loss continues.  Intervention: Reviewed strategies for increasing calories and protein in smoothies/milkshakes throughout the day.  Recommended increased fluid intake and reviews sources of increased fluids in her diet.  Questions were answered.  Teach back method used.  Monitoring, evaluation, goals: Patient has been unable to increase calories and protein, and has had weight loss.  Next visit: To be scheduled with further treatments as needed.   **Disclaimer: This note was dictated with voice recognition software. Similar sounding words can inadvertently be transcribed and this note may contain transcription errors which may not have been corrected upon publication of note.**

## 2014-04-06 NOTE — Telephone Encounter (Signed)
Pt confirmed labs/ov per 08/03 POF, gave pt AVS..Marland KitchenKJ

## 2014-04-06 NOTE — Patient Instructions (Signed)
[facility] Discharge Instructions for Patients Receiving Chemotherapy  Today you received the following chemotherapy agents: Taxol/ Carboplatin  To help prevent nausea and vomiting after your treatment, we encourage you to take your nausea medication as needed.   If you develop nausea and vomiting that is not controlled by your nausea medication, call the clinic.   BELOW ARE SYMPTOMS THAT SHOULD BE REPORTED IMMEDIATELY:  *FEVER GREATER THAN 100.5 F  *CHILLS WITH OR WITHOUT FEVER  NAUSEA AND VOMITING THAT IS NOT CONTROLLED WITH YOUR NAUSEA MEDICATION  *UNUSUAL SHORTNESS OF BREATH  *UNUSUAL BRUISING OR BLEEDING  TENDERNESS IN MOUTH AND THROAT WITH OR WITHOUT PRESENCE OF ULCERS  *URINARY PROBLEMS  *BOWEL PROBLEMS  UNUSUAL RASH Items with * indicate a potential emergency and should be followed up as soon as possible.  Feel free to call the clinic you have any questions or concerns. The clinic phone number is [phone].    

## 2014-04-07 ENCOUNTER — Ambulatory Visit (HOSPITAL_BASED_OUTPATIENT_CLINIC_OR_DEPARTMENT_OTHER): Payer: Medicare Other

## 2014-04-07 VITALS — BP 147/62 | HR 67 | Temp 97.9°F

## 2014-04-07 DIAGNOSIS — C342 Malignant neoplasm of middle lobe, bronchus or lung: Secondary | ICD-10-CM

## 2014-04-07 DIAGNOSIS — Z5189 Encounter for other specified aftercare: Secondary | ICD-10-CM

## 2014-04-07 MED ORDER — PEGFILGRASTIM INJECTION 6 MG/0.6ML
6.0000 mg | Freq: Once | SUBCUTANEOUS | Status: AC
  Administered 2014-04-07: 6 mg via SUBCUTANEOUS
  Filled 2014-04-07: qty 0.6

## 2014-04-08 NOTE — Patient Instructions (Signed)
Continue weekly labs as scheduled Return for IV fluids also a scheduled Followup in 3 weeks with a restaging CT scan of your chest to reevaluate your disease

## 2014-04-09 ENCOUNTER — Ambulatory Visit (HOSPITAL_BASED_OUTPATIENT_CLINIC_OR_DEPARTMENT_OTHER): Payer: Medicare Other

## 2014-04-09 VITALS — BP 183/64 | HR 64 | Temp 98.3°F | Resp 20

## 2014-04-09 DIAGNOSIS — C342 Malignant neoplasm of middle lobe, bronchus or lung: Secondary | ICD-10-CM

## 2014-04-09 DIAGNOSIS — E860 Dehydration: Secondary | ICD-10-CM

## 2014-04-09 DIAGNOSIS — R5383 Other fatigue: Secondary | ICD-10-CM

## 2014-04-09 DIAGNOSIS — R5381 Other malaise: Secondary | ICD-10-CM

## 2014-04-09 MED ORDER — SODIUM CHLORIDE 0.9 % IV SOLN
Freq: Once | INTRAVENOUS | Status: AC
  Administered 2014-04-09: 10:00:00 via INTRAVENOUS

## 2014-04-09 NOTE — Patient Instructions (Signed)
Dehydration, Adult Dehydration is when you lose more fluids from the body than you take in. Vital organs like the kidneys, brain, and heart cannot function without a proper amount of fluids and salt. Any loss of fluids from the body can cause dehydration.  CAUSES   Vomiting.  Diarrhea.  Excessive sweating.  Excessive urine output.  Fever. SYMPTOMS  Mild dehydration  Thirst.  Dry lips.  Slightly dry mouth. Moderate dehydration  Very dry mouth.  Sunken eyes.  Skin does not bounce back quickly when lightly pinched and released.  Dark urine and decreased urine production.  Decreased tear production.  Headache. Severe dehydration  Very dry mouth.  Extreme thirst.  Rapid, weak pulse (more than 100 beats per minute at rest).  Cold hands and feet.  Not able to sweat in spite of heat and temperature.  Rapid breathing.  Blue lips.  Confusion and lethargy.  Difficulty being awakened.  Minimal urine production.  No tears. DIAGNOSIS  Your caregiver will diagnose dehydration based on your symptoms and your exam. Blood and urine tests will help confirm the diagnosis. The diagnostic evaluation should also identify the cause of dehydration. TREATMENT  Treatment of mild or moderate dehydration can often be done at home by increasing the amount of fluids that you drink. It is best to drink small amounts of fluid more often. Drinking too much at one time can make vomiting worse. Refer to the home care instructions below. Severe dehydration needs to be treated at the hospital where you will probably be given intravenous (IV) fluids that contain water and electrolytes. HOME CARE INSTRUCTIONS   Ask your caregiver about specific rehydration instructions.  Drink enough fluids to keep your urine clear or pale yellow.  Drink small amounts frequently if you have nausea and vomiting.  Eat as you normally do.  Avoid:  Foods or drinks high in sugar.  Carbonated  drinks.  Juice.  Extremely hot or cold fluids.  Drinks with caffeine.  Fatty, greasy foods.  Alcohol.  Tobacco.  Overeating.  Gelatin desserts.  Wash your hands well to avoid spreading bacteria and viruses.  Only take over-the-counter or prescription medicines for pain, discomfort, or fever as directed by your caregiver.  Ask your caregiver if you should continue all prescribed and over-the-counter medicines.  Keep all follow-up appointments with your caregiver. SEEK MEDICAL CARE IF:  You have abdominal pain and it increases or stays in one area (localizes).  You have a rash, stiff neck, or severe headache.  You are irritable, sleepy, or difficult to awaken.  You are weak, dizzy, or extremely thirsty. SEEK IMMEDIATE MEDICAL CARE IF:   You are unable to keep fluids down or you get worse despite treatment.  You have frequent episodes of vomiting or diarrhea.  You have blood or green matter (bile) in your vomit.  You have blood in your stool or your stool looks black and tarry.  You have not urinated in 6 to 8 hours, or you have only urinated a small amount of very dark urine.  You have a fever.  You faint. MAKE SURE YOU:   Understand these instructions.  Will watch your condition.  Will get help right away if you are not doing well or get worse. Document Released: 08/21/2005 Document Revised: 11/13/2011 Document Reviewed: 04/10/2011 Exit[employer] Patient Information 2015 [employer]. This information is not intended to replace advice given to you by your health care provider. Make sure you discuss any questions you have with your health care   provider.  

## 2014-04-13 ENCOUNTER — Other Ambulatory Visit: Payer: Self-pay | Admitting: Medical Oncology

## 2014-04-13 ENCOUNTER — Other Ambulatory Visit (HOSPITAL_BASED_OUTPATIENT_CLINIC_OR_DEPARTMENT_OTHER): Payer: Medicare Other

## 2014-04-13 DIAGNOSIS — D702 Other drug-induced agranulocytosis: Secondary | ICD-10-CM

## 2014-04-13 DIAGNOSIS — C342 Malignant neoplasm of middle lobe, bronchus or lung: Secondary | ICD-10-CM

## 2014-04-13 DIAGNOSIS — C3490 Malignant neoplasm of unspecified part of unspecified bronchus or lung: Secondary | ICD-10-CM

## 2014-04-13 DIAGNOSIS — B370 Candidal stomatitis: Secondary | ICD-10-CM

## 2014-04-13 LAB — CBC WITH DIFFERENTIAL/PLATELET
BASO%: 0.8 % (ref 0.0–2.0)
Basophils Absolute: 0 10*3/uL (ref 0.0–0.1)
EOS%: 29.9 % — ABNORMAL HIGH (ref 0.0–7.0)
Eosinophils Absolute: 0.4 10*3/uL (ref 0.0–0.5)
HCT: 35.1 % (ref 34.8–46.6)
HGB: 12 g/dL (ref 11.6–15.9)
LYMPH#: 0.6 10*3/uL — AB (ref 0.9–3.3)
LYMPH%: 45.7 % (ref 14.0–49.7)
MCH: 31.8 pg (ref 25.1–34.0)
MCHC: 34.2 g/dL (ref 31.5–36.0)
MCV: 93.1 fL (ref 79.5–101.0)
MONO#: 0.3 10*3/uL (ref 0.1–0.9)
MONO%: 22.8 % — ABNORMAL HIGH (ref 0.0–14.0)
NEUT#: 0 10*3/uL — CL (ref 1.5–6.5)
NEUT%: 0.8 % — AB (ref 38.4–76.8)
NRBC: 0 % (ref 0–0)
Platelets: 60 10*3/uL — ABNORMAL LOW (ref 145–400)
RBC: 3.77 10*6/uL (ref 3.70–5.45)
RDW: 15.7 % — ABNORMAL HIGH (ref 11.2–14.5)
WBC: 1.3 10*3/uL — ABNORMAL LOW (ref 3.9–10.3)

## 2014-04-13 LAB — COMPREHENSIVE METABOLIC PANEL
ALBUMIN: 3.4 g/dL — AB (ref 3.5–5.2)
ALT: 27 U/L (ref 0–35)
AST: 17 U/L (ref 0–37)
Alkaline Phosphatase: 82 U/L (ref 39–117)
BUN: 16 mg/dL (ref 6–23)
CHLORIDE: 96 meq/L (ref 96–112)
CO2: 24 mEq/L (ref 19–32)
Calcium: 9.3 mg/dL (ref 8.4–10.5)
Creatinine, Ser: 1.01 mg/dL (ref 0.50–1.10)
Glucose, Bld: 96 mg/dL (ref 70–99)
POTASSIUM: 4.5 meq/L (ref 3.5–5.3)
Sodium: 131 mEq/L — ABNORMAL LOW (ref 135–145)
TOTAL PROTEIN: 5.9 g/dL — AB (ref 6.0–8.3)
Total Bilirubin: 0.6 mg/dL (ref 0.2–1.2)

## 2014-04-13 LAB — HOLD TUBE, BLOOD BANK

## 2014-04-13 MED ORDER — MOXIFLOXACIN HCL 400 MG PO TABS: 400.0000 mg | ORAL_TABLET | Freq: Every day | ORAL | Status: DC

## 2014-04-13 MED ORDER — CLOTRIMAZOLE 10 MG MT LOZG: 10.0000 mg | LOZENGE | Freq: Every day | OROMUCOSAL | Status: DC

## 2014-04-13 NOTE — Telephone Encounter (Signed)
Reviewed neutropenic precautions with pt and when to call and when to start antibiotic.She voiced understanding.

## 2014-04-14 ENCOUNTER — Other Ambulatory Visit: Payer: Self-pay | Admitting: Medical Oncology

## 2014-04-14 ENCOUNTER — Telehealth: Payer: Self-pay | Admitting: Medical Oncology

## 2014-04-14 DIAGNOSIS — C342 Malignant neoplasm of middle lobe, bronchus or lung: Secondary | ICD-10-CM

## 2014-04-14 NOTE — Telephone Encounter (Signed)
appts given for tomorrow.

## 2014-04-14 NOTE — Telephone Encounter (Signed)
Pt called to report she is very tired and weak and would like to get possible fluids tomorrow.

## 2014-04-15 ENCOUNTER — Ambulatory Visit (HOSPITAL_BASED_OUTPATIENT_CLINIC_OR_DEPARTMENT_OTHER): Payer: Medicare Other

## 2014-04-15 ENCOUNTER — Other Ambulatory Visit (HOSPITAL_BASED_OUTPATIENT_CLINIC_OR_DEPARTMENT_OTHER): Payer: Medicare Other

## 2014-04-15 VITALS — BP 138/50 | HR 63 | Temp 97.4°F

## 2014-04-15 DIAGNOSIS — C3490 Malignant neoplasm of unspecified part of unspecified bronchus or lung: Secondary | ICD-10-CM

## 2014-04-15 DIAGNOSIS — E860 Dehydration: Secondary | ICD-10-CM

## 2014-04-15 DIAGNOSIS — C342 Malignant neoplasm of middle lobe, bronchus or lung: Secondary | ICD-10-CM

## 2014-04-15 LAB — CBC WITH DIFFERENTIAL/PLATELET
BASO%: 0.6 % (ref 0.0–2.0)
Basophils Absolute: 0.1 10*3/uL (ref 0.0–0.1)
EOS%: 5.3 % (ref 0.0–7.0)
Eosinophils Absolute: 0.5 10*3/uL (ref 0.0–0.5)
HCT: 32.7 % — ABNORMAL LOW (ref 34.8–46.6)
HGB: 10.9 g/dL — ABNORMAL LOW (ref 11.6–15.9)
LYMPH%: 10.3 % — ABNORMAL LOW (ref 14.0–49.7)
MCH: 31.6 pg (ref 25.1–34.0)
MCHC: 33.3 g/dL (ref 31.5–36.0)
MCV: 94.8 fL (ref 79.5–101.0)
MONO#: 1.4 10*3/uL — AB (ref 0.1–0.9)
MONO%: 14.6 % — ABNORMAL HIGH (ref 0.0–14.0)
NEUT#: 6.6 10*3/uL — ABNORMAL HIGH (ref 1.5–6.5)
NEUT%: 69.2 % (ref 38.4–76.8)
PLATELETS: 53 10*3/uL — AB (ref 145–400)
RBC: 3.45 10*6/uL — AB (ref 3.70–5.45)
RDW: 16.8 % — ABNORMAL HIGH (ref 11.2–14.5)
WBC: 9.6 10*3/uL (ref 3.9–10.3)
lymph#: 1 10*3/uL (ref 0.9–3.3)

## 2014-04-15 LAB — HOLD TUBE, BLOOD BANK

## 2014-04-15 MED ORDER — HEPARIN SOD (PORK) LOCK FLUSH 100 UNIT/ML IV SOLN
500.0000 [IU] | Freq: Once | INTRAVENOUS | Status: DC
  Filled 2014-04-15: qty 5

## 2014-04-15 MED ORDER — SODIUM CHLORIDE 0.9 % IJ SOLN
10.0000 mL | INTRAMUSCULAR | Status: DC | PRN
  Filled 2014-04-15: qty 10

## 2014-04-15 MED ORDER — SODIUM CHLORIDE 0.9 % IV SOLN
1000.0000 mL | INTRAVENOUS | Status: DC
  Administered 2014-04-15: 13:00:00 via INTRAVENOUS

## 2014-04-15 NOTE — Progress Notes (Signed)
Pt reported "bedsore " at top of buttock over coccyx. She has been applying Vaseline over it. This area  is approx 3/4 in. In diameter , skin intact , slight redness around edges. I instructed pt to keep area clean and dry and to apply balmax ( not vaseline)  and to cover with op cite dressing . She was instructed to monitor for signs of infection including increased pain , any draining and or temp >/= 100.5 f.

## 2014-04-15 NOTE — Progress Notes (Signed)
Patient states she has been very tired over the past couple of days. Patient states she has had multiple episodes of diarrhea over the past couple of days resolving with Imodium. Patient denies diarrhea today stating she only has gas today. Awilda Metro, PA notified. Order given and carried out for Normal saline 2 hours per Awilda Metro, PA.

## 2014-04-15 NOTE — Patient Instructions (Signed)
Dehydration, Adult Dehydration is when you lose more fluids from the body than you take in. Vital organs like the kidneys, brain, and heart cannot function without a proper amount of fluids and salt. Any loss of fluids from the body can cause dehydration.  CAUSES   Vomiting.  Diarrhea.  Excessive sweating.  Excessive urine output.  Fever. SYMPTOMS  Mild dehydration  Thirst.  Dry lips.  Slightly dry mouth. Moderate dehydration  Very dry mouth.  Sunken eyes.  Skin does not bounce back quickly when lightly pinched and released.  Dark urine and decreased urine production.  Decreased tear production.  Headache. Severe dehydration  Very dry mouth.  Extreme thirst.  Rapid, weak pulse (more than 100 beats per minute at rest).  Cold hands and feet.  Not able to sweat in spite of heat and temperature.  Rapid breathing.  Blue lips.  Confusion and lethargy.  Difficulty being awakened.  Minimal urine production.  No tears. DIAGNOSIS  Your caregiver will diagnose dehydration based on your symptoms and your exam. Blood and urine tests will help confirm the diagnosis. The diagnostic evaluation should also identify the cause of dehydration. TREATMENT  Treatment of mild or moderate dehydration can often be done at home by increasing the amount of fluids that you drink. It is best to drink small amounts of fluid more often. Drinking too much at one time can make vomiting worse. Refer to the home care instructions below. Severe dehydration needs to be treated at the hospital where you will probably be given intravenous (IV) fluids that contain water and electrolytes. HOME CARE INSTRUCTIONS   Ask your caregiver about specific rehydration instructions.  Drink enough fluids to keep your urine clear or pale yellow.  Drink small amounts frequently if you have nausea and vomiting.  Eat as you normally do.  Avoid:  Foods or drinks high in sugar.  Carbonated  drinks.  Juice.  Extremely hot or cold fluids.  Drinks with caffeine.  Fatty, greasy foods.  Alcohol.  Tobacco.  Overeating.  Gelatin desserts.  Wash your hands well to avoid spreading bacteria and viruses.  Only take over-the-counter or prescription medicines for pain, discomfort, or fever as directed by your caregiver.  Ask your caregiver if you should continue all prescribed and over-the-counter medicines.  Keep all follow-up appointments with your caregiver. SEEK MEDICAL CARE IF:  You have abdominal pain and it increases or stays in one area (localizes).  You have a rash, stiff neck, or severe headache.  You are irritable, sleepy, or difficult to awaken.  You are weak, dizzy, or extremely thirsty. SEEK IMMEDIATE MEDICAL CARE IF:   You are unable to keep fluids down or you get worse despite treatment.  You have frequent episodes of vomiting or diarrhea.  You have blood or green matter (bile) in your vomit.  You have blood in your stool or your stool looks black and tarry.  You have not urinated in 6 to 8 hours, or you have only urinated a small amount of very dark urine.  You have a fever.  You faint. MAKE SURE YOU:   Understand these instructions.  Will watch your condition.  Will get help right away if you are not doing well or get worse. Document Released: 08/21/2005 Document Revised: 11/13/2011 Document Reviewed: 04/10/2011 Exit[employer] Patient Information 2015 [employer]. This information is not intended to replace advice given to you by your health care provider. Make sure you discuss any questions you have with your health care   provider.  

## 2014-04-17 ENCOUNTER — Other Ambulatory Visit: Payer: Self-pay | Admitting: Internal Medicine

## 2014-04-17 NOTE — Telephone Encounter (Signed)
Rx was sent to pharmacy electronically. 

## 2014-04-20 ENCOUNTER — Other Ambulatory Visit (HOSPITAL_BASED_OUTPATIENT_CLINIC_OR_DEPARTMENT_OTHER): Payer: Medicare Other

## 2014-04-20 DIAGNOSIS — C3490 Malignant neoplasm of unspecified part of unspecified bronchus or lung: Secondary | ICD-10-CM

## 2014-04-20 DIAGNOSIS — C342 Malignant neoplasm of middle lobe, bronchus or lung: Secondary | ICD-10-CM

## 2014-04-20 LAB — COMPREHENSIVE METABOLIC PANEL (CC13)
ALK PHOS: 134 U/L (ref 40–150)
ALT: 16 U/L (ref 0–55)
ANION GAP: 8 meq/L (ref 3–11)
AST: 17 U/L (ref 5–34)
Albumin: 2.9 g/dL — ABNORMAL LOW (ref 3.5–5.0)
BILIRUBIN TOTAL: 0.23 mg/dL (ref 0.20–1.20)
BUN: 9.7 mg/dL (ref 7.0–26.0)
CO2: 28 meq/L (ref 22–29)
CREATININE: 0.9 mg/dL (ref 0.6–1.1)
Calcium: 9.1 mg/dL (ref 8.4–10.4)
Chloride: 103 mEq/L (ref 98–109)
GLUCOSE: 111 mg/dL (ref 70–140)
Potassium: 4.1 mEq/L (ref 3.5–5.1)
SODIUM: 139 meq/L (ref 136–145)
Total Protein: 6 g/dL — ABNORMAL LOW (ref 6.4–8.3)

## 2014-04-20 LAB — CBC WITH DIFFERENTIAL/PLATELET
BASO%: 0.6 % (ref 0.0–2.0)
Basophils Absolute: 0.1 10*3/uL (ref 0.0–0.1)
EOS%: 1 % (ref 0.0–7.0)
Eosinophils Absolute: 0.2 10*3/uL (ref 0.0–0.5)
HCT: 31.9 % — ABNORMAL LOW (ref 34.8–46.6)
HEMOGLOBIN: 10.5 g/dL — AB (ref 11.6–15.9)
LYMPH#: 1 10*3/uL (ref 0.9–3.3)
LYMPH%: 6.5 % — ABNORMAL LOW (ref 14.0–49.7)
MCH: 31 pg (ref 25.1–34.0)
MCHC: 32.8 g/dL (ref 31.5–36.0)
MCV: 94.6 fL (ref 79.5–101.0)
MONO#: 1 10*3/uL — ABNORMAL HIGH (ref 0.1–0.9)
MONO%: 6.3 % (ref 0.0–14.0)
NEUT#: 13 10*3/uL — ABNORMAL HIGH (ref 1.5–6.5)
NEUT%: 85.6 % — ABNORMAL HIGH (ref 38.4–76.8)
Platelets: 115 10*3/uL — ABNORMAL LOW (ref 145–400)
RBC: 3.37 10*6/uL — ABNORMAL LOW (ref 3.70–5.45)
RDW: 17 % — AB (ref 11.2–14.5)
WBC: 15.2 10*3/uL — ABNORMAL HIGH (ref 3.9–10.3)

## 2014-04-24 ENCOUNTER — Ambulatory Visit (HOSPITAL_COMMUNITY)
Admission: RE | Admit: 2014-04-24 | Discharge: 2014-04-24 | Disposition: A | Discharge status: Home / Self Care | Payer: Medicare Other | Source: Ambulatory Visit | Attending: Physician Assistant | Admitting: Physician Assistant

## 2014-04-24 DIAGNOSIS — C342 Malignant neoplasm of middle lobe, bronchus or lung: Secondary | ICD-10-CM | POA: Diagnosis present

## 2014-04-24 MED ORDER — IOHEXOL 300 MG/ML  SOLN
80.0000 mL | Freq: Once | INTRAMUSCULAR | Status: AC | PRN
  Administered 2014-04-24: 80 mL via INTRAVENOUS

## 2014-04-27 ENCOUNTER — Other Ambulatory Visit (HOSPITAL_BASED_OUTPATIENT_CLINIC_OR_DEPARTMENT_OTHER): Payer: Medicare Other

## 2014-04-27 ENCOUNTER — Telehealth: Payer: Self-pay | Admitting: Internal Medicine

## 2014-04-27 ENCOUNTER — Ambulatory Visit (HOSPITAL_BASED_OUTPATIENT_CLINIC_OR_DEPARTMENT_OTHER): Payer: Medicare Other | Admitting: Internal Medicine

## 2014-04-27 VITALS — BP 186/68 | HR 66 | Temp 97.8°F | Resp 19 | Ht 59.0 in | Wt 139.5 lb

## 2014-04-27 DIAGNOSIS — C3490 Malignant neoplasm of unspecified part of unspecified bronchus or lung: Secondary | ICD-10-CM

## 2014-04-27 DIAGNOSIS — C342 Malignant neoplasm of middle lobe, bronchus or lung: Secondary | ICD-10-CM

## 2014-04-27 DIAGNOSIS — F411 Generalized anxiety disorder: Secondary | ICD-10-CM

## 2014-04-27 DIAGNOSIS — I10 Essential (primary) hypertension: Secondary | ICD-10-CM

## 2014-04-27 LAB — CBC WITH DIFFERENTIAL/PLATELET
BASO%: 1.2 % (ref 0.0–2.0)
BASOS ABS: 0.1 10*3/uL (ref 0.0–0.1)
EOS ABS: 0.8 10*3/uL — AB (ref 0.0–0.5)
EOS%: 8.8 % — ABNORMAL HIGH (ref 0.0–7.0)
HEMATOCRIT: 31.7 % — AB (ref 34.8–46.6)
HEMOGLOBIN: 10.6 g/dL — AB (ref 11.6–15.9)
LYMPH#: 0.9 10*3/uL (ref 0.9–3.3)
LYMPH%: 10.4 % — AB (ref 14.0–49.7)
MCH: 31.8 pg (ref 25.1–34.0)
MCHC: 33.6 g/dL (ref 31.5–36.0)
MCV: 94.7 fL (ref 79.5–101.0)
MONO#: 0.5 10*3/uL (ref 0.1–0.9)
MONO%: 5.9 % (ref 0.0–14.0)
NEUT%: 73.7 % (ref 38.4–76.8)
NEUTROS ABS: 6.3 10*3/uL (ref 1.5–6.5)
PLATELETS: 181 10*3/uL (ref 145–400)
RBC: 3.34 10*6/uL — ABNORMAL LOW (ref 3.70–5.45)
RDW: 16.9 % — ABNORMAL HIGH (ref 11.2–14.5)
WBC: 8.6 10*3/uL (ref 3.9–10.3)

## 2014-04-27 LAB — COMPREHENSIVE METABOLIC PANEL (CC13)
ALT: 14 U/L (ref 0–55)
ANION GAP: 8 meq/L (ref 3–11)
AST: 19 U/L (ref 5–34)
Albumin: 3.2 g/dL — ABNORMAL LOW (ref 3.5–5.0)
Alkaline Phosphatase: 115 U/L (ref 40–150)
BUN: 14 mg/dL (ref 7.0–26.0)
CO2: 27 meq/L (ref 22–29)
CREATININE: 1 mg/dL (ref 0.6–1.1)
Calcium: 9.4 mg/dL (ref 8.4–10.4)
Chloride: 103 mEq/L (ref 98–109)
GLUCOSE: 84 mg/dL (ref 70–140)
Potassium: 4.3 mEq/L (ref 3.5–5.1)
Sodium: 138 mEq/L (ref 136–145)
Total Bilirubin: 0.28 mg/dL (ref 0.20–1.20)
Total Protein: 6.4 g/dL (ref 6.4–8.3)

## 2014-04-27 NOTE — Telephone Encounter (Signed)
Pt confirmed labs/ov per 08/24 POF, sent msg to r/s and add chemo , gave pt AVS..Marland KitchenKJ

## 2014-04-27 NOTE — Progress Notes (Signed)
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  PRIOR THERAPY:  1) On 11/13/2013 the patient underwent exploratory right VATS with mediastinal biopsy under the care of Dr. Roxan Hockey. 2) Concurrent chemoradiation with weekly carboplatin for AUC of 2 and paclitaxel 45 mg/M2, status post 6 cycles with partial response. First cycle was given on 12/01/2013. 3) Consolidation chemotherapy with carboplatin for AUC of 5 and paclitaxel 175 mg/M2 every 3 weeks with Neulasta support. First dose 02/23/2014. Status post 3 cycles with partial response.   CURRENT THERAPY: Observation.  INTERVAL HISTORY: Kimberly [name] 73 y.o. female returns to the clinic today for followup visit accompanied by her son. The patient completed 3 cycles of consolidation chemotherapy with reduced dose carboplatin and paclitaxel. She had a rough time with her treatment with significant pancytopenia and weakness as well as fatigue. She is recovering slowly from her treatment. She continues to have lack of appetite and mild peripheral neuropathy. She denied having any significant nausea or vomiting, no fever or chills. She denied having any chest pain but continues to have shortness of breath with exertion with no hemoptysis. She had repeat CT scan of the chest performed recently and she is here for evaluation and discussion of her scan results.  MEDICAL HISTORY: Past Medical History  Diagnosis Date  . Hypertension   . Thyroid disease   . GERD (gastroesophageal reflux disease)   . Dyslipidemia   . Aortic insufficiency   . Mitral insufficiency   . History of nuclear stress test 02/26/2006    exercise myoview; normal pattern of perfusion; low risk scan   .  PONV (postoperative nausea and vomiting)   . Heart murmur   . Hypothyroidism   . Cough     dry, endobronchial mass  . Pneumonia     pus bronchitis  . Arthritis   . Syncope     "states she has passed out a few times. dr is trying to find cause.last time when geeting ready to go home after video bronch/bx  . Shortness of breath     with exertion  . Anxiety     due to surgery   . Bronchitis   . Radiation 12/01/13-01/08/14    50.4 gray to right central chest  . Atrial fibrillation   . Squamous cell carcinoma of lung     ALLERGIES:  is allergic to milk-related compounds.  MEDICATIONS:  Current Outpatient Prescriptions  Medication Sig Dispense Refill  . albuterol (PROVENTIL HFA;VENTOLIN HFA) 108 (90 BASE) MCG/ACT inhaler Inhale 1 puff into the lungs every 6 (six) hours as needed for wheezing or shortness of breath.      [name] amiodarone (PACERONE) 200 MG tablet Take 200 mg by mouth daily.      [name] atorvastatin (LIPITOR) 20 MG tablet Take 20 mg by mouth every morning.       [name] EPINEPHrine 0.3 mg/0.3 mL IJ SOAJ injection Inject 0.3 mg into the muscle once as needed (reaction to milk products).      [name] levothyroxine (SYNTHROID, LEVOTHROID) 125 MCG tablet Take 125 mcg by mouth daily before breakfast.      . losartan (COZAAR) 25 MG tablet       . metoprolol succinate (TOPROL-XL) 25 MG 24 hr tablet Take 0.5  tablets (12.5 mg total) by mouth daily.  30 tablet  4  . montelukast (SINGULAIR) 10 MG tablet Take 10 mg by mouth daily.      . pantoprazole (PROTONIX) 40 MG tablet Take 40 mg by mouth 2 (two) times daily.       . ranitidine (ZANTAC) 300 MG tablet Take 300 mg by mouth at bedtime.      . clotrimazole (MYCELEX) 10 MG troche Take 1 lozenge (10 mg total) by mouth 5 (five) times daily.  50 lozenge  0  . ibuprofen (ADVIL,MOTRIN) 200 MG tablet Take 200-400 mg by mouth every 6 (six) hours as needed for moderate pain.       [name] loratadine (CLARITIN) 10 MG tablet Take 10 mg by mouth daily as needed for  allergies. For 7 days after chemo      . moxifloxacin (AVELOX) 400 MG tablet Take 1 tablet (400 mg total) by mouth daily at 8 pm.  5 tablet  0  . nystatin (MYCOSTATIN) 100000 UNIT/ML suspension Take 5 mLs (500,000 Units total) by mouth 4 (four) times daily.  240 mL  0  . prochlorperazine (COMPAZINE) 10 MG tablet Take 1 tablet (10 mg total) by mouth every 6 (six) hours as needed for nausea or vomiting.  30 tablet  1   No current facility-administered medications for this visit.    SURGICAL HISTORY:  Past Surgical History  Procedure Laterality Date  . Thyroidectomy  1973  . Dilation and curettage of uterus    . Carpal tunnel release Right 1988  . Rotator cuff repair  2006    ? side  . H/o met test w/pft  04/02/2012    low risk; peak VO2 77% predicted  . Cardiac catheterization  07/08/2008    normal coronaries  . Transthoracic echocardiogram  09/25/2012    EF 55-60%; mild LVH & mild concentric hypertrophy; mild AV regurg; RV systolic pressure increase consistent with mild pulm HTN  . Video bronchoscopy Bilateral 09/25/2013    Procedure: VIDEO BRONCHOSCOPY WITHOUT FLUORO;  Surgeon: Tanda Rockers, MD;  Location: [facility];  Service: Cardiopulmonary;  Laterality: Bilateral;  . Joint replacement  2003    thumb rt  . Knee arthroscopy  12    rt meniscus  . Video bronchoscopy with endobronchial ultrasound N/A 10/30/2013    Procedure: VIDEO BRONCHOSCOPY WITH ENDOBRONCHIAL ULTRASOUND;  Surgeon: Melrose Nakayama, MD;  Location: [facility];  Service: Thoracic;  Laterality: N/A;  . Mediastinoscopy N/A 10/30/2013    Procedure: MEDIASTINOSCOPY;  Surgeon: Melrose Nakayama, MD;  Location: [facility];  Service: Thoracic;  Laterality: N/A;  . Eye surgery Bilateral   . Video assisted thoracoscopy (vats)/ lobectomy Right 11/13/2013    Procedure: VIDEO ASSISTED THORACOSCOPY (VATS) with mediastinal  biopsies;  Surgeon: Melrose Nakayama, MD;  Location: [place];  Service: Thoracic;  Laterality: Right;  RIGHT  VATS,mediastinal biopsies    REVIEW OF SYSTEMS:  Constitutional: positive for fatigue Eyes: negative Ears, nose, mouth, throat, and face: negative Respiratory: positive for cough and dyspnea on exertion Cardiovascular: negative Gastrointestinal: negative Genitourinary:negative Integument/breast: negative Hematologic/lymphatic: negative Musculoskeletal:negative Neurological: negative Behavioral/Psych: negative Endocrine: negative Allergic/Immunologic: negative   PHYSICAL EXAMINATION: General appearance: alert, cooperative, fatigued and no distress Head: Normocephalic, without obvious abnormality, atraumatic Neck: no adenopathy, no JVD, supple, symmetrical, trachea midline and thyroid not enlarged, symmetric, no tenderness/mass/nodules Lymph nodes: Cervical, supraclavicular, and axillary nodes normal. Resp: clear to auscultation bilaterally Back: symmetric, no curvature. ROM normal. No CVA tenderness. Cardio: regular rate  and rhythm, S1, S2 normal, no murmur, click, rub or gallop GI: soft, non-tender; bowel sounds normal; no masses,  no organomegaly Extremities: extremities normal, atraumatic, no cyanosis or edema Neurologic: Alert and oriented X 3, normal strength and tone. Normal symmetric reflexes. Normal coordination and gait  ECOG PERFORMANCE STATUS: 1 - Symptomatic but completely ambulatory  Blood pressure 186/68, pulse 66, temperature 97.8 F (36.6 C), temperature source Oral, resp. rate 19, height _0  (1.499 m), weight 139 lb 8 oz (63.277 kg).  LABORATORY DATA: Lab Results  Component Value Date   WBC 8.6 04/27/2014   HGB 10.6* 04/27/2014   HCT 31.7* 04/27/2014   MCV 94.7 04/27/2014   PLT 181 04/27/2014      Chemistry      Component Value Date/Time   NA 138 04/27/2014 0858   NA 131* 04/13/2014 0814   K 4.3 04/27/2014 0858   K 4.5 04/13/2014 0814   CL 96 04/13/2014 0814   CO2 27 04/27/2014 0858   CO2 24 04/13/2014 0814   BUN 14.0 04/27/2014 0858   BUN 16 04/13/2014  0814   CREATININE 1.0 04/27/2014 0858   CREATININE 1.01 04/13/2014 0814      Component Value Date/Time   CALCIUM 9.4 04/27/2014 0858   CALCIUM 9.3 04/13/2014 0814   ALKPHOS 115 04/27/2014 0858   ALKPHOS 82 04/13/2014 0814   AST 19 04/27/2014 0858   AST 17 04/13/2014 0814   ALT 14 04/27/2014 0858   ALT 27 04/13/2014 0814   BILITOT 0.28 04/27/2014 0858   BILITOT 0.6 04/13/2014 0814       RADIOGRAPHIC STUDIES: Ct Chest W Contrast  04/24/2014   CLINICAL DATA:  Followup lung cancer.  EXAM: CT CHEST WITH CONTRAST  TECHNIQUE: Multidetector CT imaging of the chest was performed during intravenous contrast administration.  CONTRAST:  58m OMNIPAQUE IOHEXOL 300 MG/ML  SOLN  COMPARISON:  02/13/2014  FINDINGS: The heart size is normal. No pericardial effusion. 8 mm sub- carinal lymph node is identified, image 27/series 2. Previously 1.1 cm. No axillary or supraclavicular adenopathy.  Right middle lobe central lung lesion measures 8 x 1.5 cm, image 27/series 2. Previously 2.6 x 3.7 cm. Continued postobstructive consolidation is identified within the right middle lobe which is improved from previous exam. Patchy areas of ground-glass attenuation and airspace consolidation in the right lung are identified and appear increased from previous exam. This is favored to represent areas of pneumonitis secondary to external beam radiation. No new suspicious nodule or mass identified. Stable granulomas within the left base.  Incidental imaging through the upper abdomen shows a small low attenuation structure within the left hepatic lobe measuring 4 mm. This is too small to characterize but remain stable from previous exam. The adrenal glands both appear normal.  Review of the visualized osseous structures is remarkable for mild mid thoracic spine degenerative disc disease  IMPRESSION: 1. Right middle lobe central mass is decreased in size from previous exam. There has also been interval decrease in size of previously described  sub- carinal lymph node. 2. New patchy areas of ground-glass attenuation and airspace consolidation in the right lung is favored to represent sequelae of external beam radiation.   Electronically Signed   By: TKerby MoorsM.D.   On: 04/24/2014 14:51   ASSESSMENT AND PLAN:  1) unresectable stage IIIA non-small cell lung cancer:  This is a very pleasant 73years old white female with unresectable stage IIIa non-small cell lung cancer completed a course of concurrent  chemoradiation with weekly carboplatin and paclitaxel status post 6 cycles.  This is followed by 3 cycles of consolidation chemotherapy with reduced dose carboplatin and paclitaxel. Her recent CT scan of the chest showed continuous improvement in her disease with decrease in the size of the right middle lobe central mass as well as the mediastinal lymphadenopathy. I discussed the scan results with the patient and her son. I recommended for her to continue on observation with repeat CT scan of the chest in 3 months with restaging of her disease.  2) Hypertension: Her blood pressure was elevated today secondary to anxiety about the scan results but the patient mentions that at home her blood pressure is usually within the normal range. She also did not take her blood pressure medication earlier today. I strongly advise the patient to take her antihypertensive medication as prescribed and to consult with her primary care physician if she continues to have elevated blood pressure.  She was advised to call immediately if she has any concerning symptoms in the interval. The patient voices understanding of current disease status and treatment options and is in agreement with the current care plan.  All questions were answered. The patient knows to call the clinic with any problems, questions or concerns. We can certainly see the patient much sooner if necessary.  Disclaimer: This note was dictated with voice recognition software. Similar  sounding words can inadvertently be transcribed and may not be corrected upon review.

## 2014-04-28 ENCOUNTER — Telehealth: Payer: Self-pay | Admitting: Internal Medicine

## 2014-04-28 NOTE — Telephone Encounter (Signed)
Spk w/pt advised of no labs for 08/31 per [facility].[name]..KJ

## 2014-04-28 NOTE — Telephone Encounter (Signed)
s.w pt and advised on lab b4 ct scan....pt ok anda ware

## 2014-05-04 ENCOUNTER — Other Ambulatory Visit: Payer: Medicare Other

## 2014-05-07 ENCOUNTER — Encounter: Payer: Self-pay | Admitting: Internal Medicine

## 2014-05-07 ENCOUNTER — Ambulatory Visit (INDEPENDENT_AMBULATORY_CARE_PROVIDER_SITE_OTHER): Payer: Medicare Other | Admitting: Internal Medicine

## 2014-05-07 VITALS — BP 140/70 | HR 72 | Ht 59.75 in | Wt 138.1 lb

## 2014-05-07 DIAGNOSIS — I10 Essential (primary) hypertension: Secondary | ICD-10-CM

## 2014-05-07 DIAGNOSIS — C342 Malignant neoplasm of middle lobe, bronchus or lung: Secondary | ICD-10-CM

## 2014-05-07 DIAGNOSIS — I4891 Unspecified atrial fibrillation: Secondary | ICD-10-CM

## 2014-05-07 DIAGNOSIS — R55 Syncope and collapse: Secondary | ICD-10-CM

## 2014-05-07 NOTE — Patient Instructions (Signed)
Your physician has recommended making the following medication changes:  STOP Losartan  Dr Debara Pickett wants you to follow-up in 6 months. You will receive a reminder letter in the mail one months in advance. If you don't receive a letter, please call our office to schedule the follow-up appointment.

## 2014-05-07 NOTE — Progress Notes (Signed)
OFFICE NOTE  Chief Complaint:  Follow up appointment  Primary Care Physician: [name], MD  HPI:  Kimberly [name] is a 73 year old female who recently has been having some chest discomfort and shortness of breath as well as 2 episodes of syncope, both of which were during stressful situations, once while on a ladder. We went ahead and checked an echocardiogram which essentially shows only mild abnormalities. She did have a loudly calcified aortic valve which was not stenotic; however, there was mild regurgitation. There was mild LVH with EF 55% to 60% and grade 1 diastolic dysfunction. She had borderline pulmonary hypertension. A monitor was also worn by her from September 13, 2012 to October 12, 2012. This showed a predominantly sinus rhythm with PACs. There was, however, 1 episode of a nonsustained ventricular tachycardia which was about 5 beats. Of course she potentially would have to had longer episodes of NSVT this could have been playing a role in her syncopal event. I do not think she, however, is at high risk for lethal sustained VT given her normal ejection fraction and no clear evidence of ischemia. As you recall, she had a low-risk MET-test on April 02, 2012, with a peak VO2 of 77% predicted without any evidence of an ischemic response. This would make it quite unlikely that she has underlying coronary blockage that is responsible for her episode. I have, however, recommended adding a beta blocker to her regimen which will additionally help control her recent uncontrolled hypertension. This is Toprol XL 12.5 mg daily. It should help also with the VT. she is continuing to take low-dose Toprol and has had no further episodes of passing out in the past 6 months. She was thoroughly evaluated by a neurologist and felt to possibly be having a vasodepressive syncope. There was lower extremity.swelling and compression stockings were recommended.  [name] returns today for followup.  She has been hospitalized a number of times since getting a diagnosis of lung cancer. 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 Unfortunately she developed A. fib with RVR and was treated and placed on amiodarone. She's been having problems with recurrent vasovagal syncope and hypotension. She does have a mass that is pressing against the atrium.  PMHx:  Past Medical History  Diagnosis Date  . Hypertension   . Thyroid disease   . GERD (gastroesophageal reflux disease)   . Dyslipidemia   . Aortic insufficiency   . Mitral insufficiency   . History of nuclear stress test 02/26/2006    exercise myoview; normal pattern of perfusion; low risk scan   . PONV (postoperative nausea and vomiting)   . Heart murmur   . Hypothyroidism   . Cough     dry, endobronchial mass  . Pneumonia     pus bronchitis  . Arthritis   . Syncope     "states she has passed out a few times. dr is trying to find cause.last time when geeting ready to go home after video bronch/bx  . Shortness of breath     with exertion  . Anxiety     due to surgery   . Bronchitis   . Radiation 12/01/13-01/08/14    50.4 gray to right central chest  . Atrial fibrillation   . Squamous cell carcinoma of lung     Past Surgical History  Procedure Laterality Date  . Thyroidectomy  1973  . Dilation and curettage of uterus    . Carpal  tunnel release Right 1988  . Rotator cuff repair  2006    ? side  . H/o met test w/pft  04/02/2012    low risk; peak VO2 77% predicted  . Cardiac catheterization  07/08/2008    normal coronaries  . Transthoracic echocardiogram  09/25/2012    EF 55-60%; mild LVH & mild concentric hypertrophy; mild AV regurg; RV systolic pressure increase consistent with mild pulm HTN  . Video bronchoscopy Bilateral 09/25/2013    Procedure: VIDEO BRONCHOSCOPY WITHOUT FLUORO;  Surgeon: Tanda Rockers, MD;  Location: [facility];  Service: Cardiopulmonary;  Laterality: Bilateral;  .  Joint replacement  2003    thumb rt  . Knee arthroscopy  12    rt meniscus  . Video bronchoscopy with endobronchial ultrasound N/A 10/30/2013    Procedure: VIDEO BRONCHOSCOPY WITH ENDOBRONCHIAL ULTRASOUND;  Surgeon: Melrose Nakayama, MD;  Location: [place];  Service: Thoracic;  Laterality: N/A;  . Mediastinoscopy N/A 10/30/2013    Procedure: MEDIASTINOSCOPY;  Surgeon: Melrose Nakayama, MD;  Location: [facility] [place];  Service: Thoracic;  Laterality: N/A;  . Eye surgery Bilateral   . Video assisted thoracoscopy (vats)/ lobectomy Right 11/13/2013    Procedure: VIDEO ASSISTED THORACOSCOPY (VATS) with mediastinal  biopsies;  Surgeon: Melrose Nakayama, MD;  Location: [place];  Service: Thoracic;  Laterality: Right;  RIGHT VATS,mediastinal biopsies    FAMHx:  Family History  Problem Relation Age of Onset  . Lung cancer Mother   . Heart attack Father   . Heart failure Maternal Grandfather   . Heart attack Paternal Grandfather     SOCHx:   reports that she quit smoking about 46 years ago. Her smoking use included Cigarettes. She has a 3 pack-year smoking history. She has never used smokeless tobacco. She reports that she drinks alcohol. She reports that she does not use illicit drugs.  ALLERGIES:  Allergies  Allergen Reactions  . Milk-Related Compounds Other (See Comments)    REACTION: GI upset, projectile vomiting    ROS: A comprehensive review of systems was negative except for: Cardiovascular: positive for lower extremity edema and syncope  HOME MEDS: Current Outpatient Prescriptions  Medication Sig Dispense Refill  . albuterol (PROVENTIL HFA;VENTOLIN HFA) 108 (90 BASE) MCG/ACT inhaler Inhale 1 puff into the lungs every 6 (six) hours as needed for wheezing or shortness of breath.      [name] amiodarone (PACERONE) 200 MG tablet Take 200 mg by mouth daily.      [name] atorvastatin (LIPITOR) 20 MG tablet Take 20 mg by mouth every morning.       [name] EPINEPHrine 0.3 mg/0.3 mL IJ SOAJ injection Inject  0.3 mg into the muscle once as needed (reaction to milk products).      [name] levothyroxine (SYNTHROID, LEVOTHROID) 125 MCG tablet Take 125 mcg by mouth daily before breakfast.      . losartan (COZAAR) 25 MG tablet Take 25 mg by mouth daily.       . metoprolol succinate (TOPROL-XL) 25 MG 24 hr tablet Take 0.5 tablets (12.5 mg total) by mouth daily.  30 tablet  4  . montelukast (SINGULAIR) 10 MG tablet Take 10 mg by mouth daily.      [name] moxifloxacin (AVELOX) 400 MG tablet Take 400 mg by mouth as needed (for elevated temperature).       No current facility-administered medications for this visit.    LABS/IMAGING: No results found for this or any previous visit (from the past 48 hour(s)). No results found.  VITALS: BP 140/70  Pulse 72  Ht 4' 11.75" (1.518 m)  Wt 138 lb 1.6 oz (62.642 kg)  BMI 27.18 kg/m2  EXAM: GEN: Awake, nad HEENT: PERRLA, EOMI LUNGS: Clear bilaterally CV: RRR, s1/s2, no MRG's ABD: S/NT, +BS EXT: No edema NEURO: A&O x 3, grossly non-focal SKIN: DRY, warm, no rashes PSYCH: Mood, affect normal  EKG: deferred  ASSESSMENT: 1. Lung cancer (large cell) 2. Recurrent vasodepressor syncope  3. Lower extremity edema 4. Hypertension - recently hypotensive 5. Paroxysmal atrial fibrillation on amiodarone  PLAN: 1.   Mrs. Jasko has been through a lot recently with lung cancer and problems with A. fib with RVR with a mass in the pericardium is pressing against the atrium. She seems to be more stable now on amiodarone. She is still having episodes of syncope with wild swings in her blood pressure and therefore recommend her coming off of losartan to allow higher blood pressure. She should remain on her beta blocker for heart rate control with regards to A. fib. Plan to see her back in 6 months or sooner as necessary.  Pixie Casino, MD, [facility] Attending Cardiologist [facility]  Rosia Syme C 05/07/2014, 11:01 AM

## 2014-07-27 ENCOUNTER — Ambulatory Visit (HOSPITAL_COMMUNITY)
Admission: RE | Admit: 2014-07-27 | Discharge: 2014-07-27 | Disposition: A | Discharge status: Home / Self Care | Payer: Medicare Other | Source: Ambulatory Visit | Attending: Internal Medicine | Admitting: Internal Medicine

## 2014-07-27 ENCOUNTER — Encounter (HOSPITAL_COMMUNITY): Payer: Self-pay

## 2014-07-27 ENCOUNTER — Other Ambulatory Visit (HOSPITAL_BASED_OUTPATIENT_CLINIC_OR_DEPARTMENT_OTHER): Payer: Medicare Other

## 2014-07-27 DIAGNOSIS — C342 Malignant neoplasm of middle lobe, bronchus or lung: Secondary | ICD-10-CM

## 2014-07-27 DIAGNOSIS — C3490 Malignant neoplasm of unspecified part of unspecified bronchus or lung: Secondary | ICD-10-CM | POA: Diagnosis present

## 2014-07-27 LAB — COMPREHENSIVE METABOLIC PANEL (CC13)
ALK PHOS: 141 U/L (ref 40–150)
ALT: 19 U/L (ref 0–55)
ANION GAP: 8 meq/L (ref 3–11)
AST: 20 U/L (ref 5–34)
Albumin: 3.2 g/dL — ABNORMAL LOW (ref 3.5–5.0)
BILIRUBIN TOTAL: 0.35 mg/dL (ref 0.20–1.20)
BUN: 22.6 mg/dL (ref 7.0–26.0)
CO2: 28 meq/L (ref 22–29)
CREATININE: 1.1 mg/dL (ref 0.6–1.1)
Calcium: 10.1 mg/dL (ref 8.4–10.4)
Chloride: 105 mEq/L (ref 98–109)
Glucose: 91 mg/dl (ref 70–140)
Potassium: 4.6 mEq/L (ref 3.5–5.1)
SODIUM: 141 meq/L (ref 136–145)
TOTAL PROTEIN: 6.5 g/dL (ref 6.4–8.3)

## 2014-07-27 LAB — CBC WITH DIFFERENTIAL/PLATELET
BASO%: 0.6 % (ref 0.0–2.0)
Basophils Absolute: 0 10*3/uL (ref 0.0–0.1)
EOS%: 12.4 % — ABNORMAL HIGH (ref 0.0–7.0)
Eosinophils Absolute: 0.8 10*3/uL — ABNORMAL HIGH (ref 0.0–0.5)
HCT: 32.2 % — ABNORMAL LOW (ref 34.8–46.6)
HGB: 10.5 g/dL — ABNORMAL LOW (ref 11.6–15.9)
LYMPH%: 16.2 % (ref 14.0–49.7)
MCH: 33.2 pg (ref 25.1–34.0)
MCHC: 32.6 g/dL (ref 31.5–36.0)
MCV: 101.9 fL — ABNORMAL HIGH (ref 79.5–101.0)
MONO#: 0.6 10*3/uL (ref 0.1–0.9)
MONO%: 9.2 % (ref 0.0–14.0)
NEUT#: 4 10*3/uL (ref 1.5–6.5)
NEUT%: 61.6 % (ref 38.4–76.8)
PLATELETS: 281 10*3/uL (ref 145–400)
RBC: 3.16 10*6/uL — ABNORMAL LOW (ref 3.70–5.45)
RDW: 12.5 % (ref 11.2–14.5)
WBC: 6.5 10*3/uL (ref 3.9–10.3)
lymph#: 1.1 10*3/uL (ref 0.9–3.3)

## 2014-07-27 MED ORDER — IOHEXOL 300 MG/ML  SOLN
80.0000 mL | Freq: Once | INTRAMUSCULAR | Status: AC | PRN
  Administered 2014-07-27: 80 mL via INTRAVENOUS

## 2014-07-28 ENCOUNTER — Encounter: Payer: Self-pay | Admitting: Internal Medicine

## 2014-07-28 ENCOUNTER — Telehealth: Payer: Self-pay | Admitting: Internal Medicine

## 2014-07-28 ENCOUNTER — Other Ambulatory Visit: Payer: Medicare Other

## 2014-07-28 ENCOUNTER — Ambulatory Visit (HOSPITAL_BASED_OUTPATIENT_CLINIC_OR_DEPARTMENT_OTHER): Payer: Medicare Other | Admitting: Internal Medicine

## 2014-07-28 VITALS — BP 152/51 | HR 62 | Temp 97.8°F | Resp 18 | Ht 59.0 in | Wt 136.0 lb

## 2014-07-28 DIAGNOSIS — C342 Malignant neoplasm of middle lobe, bronchus or lung: Secondary | ICD-10-CM

## 2014-07-28 NOTE — Progress Notes (Signed)
[place] Telephone:([phone]) [phone]   Fax:([phone]) [phone]  OFFICE PROGRESS NOTE  [name], MD [address] 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  PRIOR THERAPY:  1) On 11/13/2013 the patient underwent exploratory right VATS with mediastinal biopsy under the care of Dr. Roxan Hockey. 2) Concurrent chemoradiation with weekly carboplatin for AUC of 2 and paclitaxel 45 mg/M2, status post 6 cycles with partial response. First cycle was given on 12/01/2013. 3) Consolidation chemotherapy with carboplatin for AUC of 5 and paclitaxel 175 mg/M2 every 3 weeks with Neulasta support. First dose 02/23/2014. Status post 3 cycles with partial response.   CURRENT THERAPY: Observation.  INTERVAL HISTORY: Kimberly [name] 73 y.o. female returns to the clinic today for followup visit. The patient has been on observation for the last 3 months. She is feeling much better today except for mild fatigue and shortness breath with exertion. She denied having any significant nausea or vomiting, no fever or chills. She denied having any chest pain but continues to have shortness of breath with exertion with no hemoptysis. She had repeat CT scan of the chest performed recently and she is here for evaluation and discussion of her scan results.  MEDICAL HISTORY: Past Medical History  Diagnosis Date  . Hypertension   . Thyroid disease   . GERD (gastroesophageal reflux disease)   . Dyslipidemia   . Aortic insufficiency   . Mitral insufficiency   . History of nuclear stress test 02/26/2006    exercise myoview; normal pattern of perfusion; low risk scan   . PONV (postoperative nausea and vomiting)   . Heart murmur   . Hypothyroidism   . Cough     dry, endobronchial mass  . Pneumonia     pus bronchitis  . Arthritis   . Syncope     "states she  has passed out a few times. dr is trying to find cause.last time when geeting ready to go home after video bronch/bx  . Shortness of breath     with exertion  . Anxiety     due to surgery   . Bronchitis   . Radiation 12/01/13-01/08/14    50.4 gray to right central chest  . Atrial fibrillation   . Squamous cell carcinoma of lung     ALLERGIES:  is allergic to milk-related compounds.  MEDICATIONS:  Current Outpatient Prescriptions  Medication Sig Dispense Refill  . albuterol (PROVENTIL HFA;VENTOLIN HFA) 108 (90 BASE) MCG/ACT inhaler Inhale 1 puff into the lungs every 6 (six) hours as needed for wheezing or shortness of breath.    [name] amiodarone (PACERONE) 200 MG tablet Take 200 mg by mouth daily.    [name] atorvastatin (LIPITOR) 20 MG tablet Take 20 mg by mouth every morning.     [name] EPINEPHrine 0.3 mg/0.3 mL IJ SOAJ injection Inject 0.3 mg into the muscle once as needed (reaction to milk products).    [name] levothyroxine (SYNTHROID, LEVOTHROID) 125 MCG tablet Take 125 mcg by mouth daily before breakfast.    . metoprolol succinate (TOPROL-XL) 25 MG 24 hr tablet Take 0.5 tablets (12.5 mg total) by mouth daily. 30 tablet 4  . montelukast (SINGULAIR) 10 MG tablet Take 10 mg by mouth daily.    [name] moxifloxacin (AVELOX) 400 MG tablet Take 400 mg by mouth as needed (for elevated temperature).     No current facility-administered  medications for this visit.    SURGICAL HISTORY:  Past Surgical History  Procedure Laterality Date  . Thyroidectomy  1973  . Dilation and curettage of uterus    . Carpal tunnel release Right 1988  . Rotator cuff repair  2006    ? side  . H/o met test w/pft  04/02/2012    low risk; peak VO2 77% predicted  . Cardiac catheterization  07/08/2008    normal coronaries  . Transthoracic echocardiogram  09/25/2012    EF 55-60%; mild LVH & mild concentric hypertrophy; mild AV regurg; RV systolic pressure increase consistent with mild pulm HTN  . Video bronchoscopy Bilateral 09/25/2013     Procedure: VIDEO BRONCHOSCOPY WITHOUT FLUORO;  Surgeon: Tanda Rockers, MD;  Location: [facility];  Service: Cardiopulmonary;  Laterality: Bilateral;  . Joint replacement  2003    thumb rt  . Knee arthroscopy  12    rt meniscus  . Video bronchoscopy with endobronchial ultrasound N/A 10/30/2013    Procedure: VIDEO BRONCHOSCOPY WITH ENDOBRONCHIAL ULTRASOUND;  Surgeon: Melrose Nakayama, MD;  Location: [facility];  Service: Thoracic;  Laterality: N/A;  . Mediastinoscopy N/A 10/30/2013    Procedure: MEDIASTINOSCOPY;  Surgeon: Melrose Nakayama, MD;  Location: [facility];  Service: Thoracic;  Laterality: N/A;  . Eye surgery Bilateral   . Video assisted thoracoscopy (vats)/ lobectomy Right 11/13/2013    Procedure: VIDEO ASSISTED THORACOSCOPY (VATS) with mediastinal  biopsies;  Surgeon: Melrose Nakayama, MD;  Location: [facility];  Service: Thoracic;  Laterality: Right;  RIGHT VATS,mediastinal biopsies    REVIEW OF SYSTEMS:  Constitutional: positive for fatigue Eyes: negative Ears, nose, mouth, throat, and face: negative Respiratory: positive for cough and dyspnea on exertion Cardiovascular: negative Gastrointestinal: negative Genitourinary:negative Integument/breast: negative Hematologic/lymphatic: negative Musculoskeletal:negative Neurological: negative Behavioral/Psych: negative Endocrine: negative Allergic/Immunologic: negative   PHYSICAL EXAMINATION: General appearance: alert, cooperative, fatigued and no distress Head: Normocephalic, without obvious abnormality, atraumatic Neck: no adenopathy, no JVD, supple, symmetrical, trachea midline and thyroid not enlarged, symmetric, no tenderness/mass/nodules Lymph nodes: Cervical, supraclavicular, and axillary nodes normal. Resp: clear to auscultation bilaterally Back: symmetric, no curvature. ROM normal. No CVA tenderness. Cardio: regular rate and rhythm, S1, S2 normal, no murmur, click, rub or gallop GI: soft, non-tender; bowel sounds normal;  no masses,  no organomegaly Extremities: extremities normal, atraumatic, no cyanosis or edema Neurologic: Alert and oriented X 3, normal strength and tone. Normal symmetric reflexes. Normal coordination and gait  ECOG PERFORMANCE STATUS: 1 - Symptomatic but completely ambulatory  Blood pressure 152/51, pulse 62, temperature 97.8 F (36.6 C), temperature source Oral, resp. rate 18, height _0  (1.499 m), weight 136 lb (61.689 kg), SpO2 99 %.  LABORATORY DATA: Lab Results  Component Value Date   WBC 6.5 07/27/2014   HGB 10.5* 07/27/2014   HCT 32.2* 07/27/2014   MCV 101.9* 07/27/2014   PLT 281 07/27/2014      Chemistry      Component Value Date/Time   NA 141 07/27/2014 0916   NA 131* 04/13/2014 0814   K 4.6 07/27/2014 0916   K 4.5 04/13/2014 0814   CL 96 04/13/2014 0814   CO2 28 07/27/2014 0916   CO2 24 04/13/2014 0814   BUN 22.6 07/27/2014 0916   BUN 16 04/13/2014 0814   CREATININE 1.1 07/27/2014 0916   CREATININE 1.01 04/13/2014 0814      Component Value Date/Time   CALCIUM 10.1 07/27/2014 0916   CALCIUM 9.3 04/13/2014 0814   ALKPHOS 141 07/27/2014 0916  ALKPHOS 82 04/13/2014 0814   AST 20 07/27/2014 0916   AST 17 04/13/2014 0814   ALT 19 07/27/2014 0916   ALT 27 04/13/2014 0814   BILITOT 0.35 07/27/2014 0916   BILITOT 0.6 04/13/2014 0814       RADIOGRAPHIC STUDIES: Ct Chest W Contrast  07/27/2014   CLINICAL DATA:  Subsequent encounter for Lung cancer restaging.  EXAM: CT CHEST WITH CONTRAST  TECHNIQUE: Multidetector CT imaging of the chest was performed during intravenous contrast administration.  CONTRAST:  38m OMNIPAQUE IOHEXOL 300 MG/ML  SOLN  COMPARISON:  04/24/2014.  FINDINGS: Soft tissue / Mediastinum: There is no axillary lymphadenopathy. Small precarinal lymph nodes are unchanged. The soft tissue fullness around the right mainstem bronchus has increased in the interval (compare image 20 series 2 today where it is 5 mm in thickness to image 23 series 2  previously where it measured 1-2 mm). The No left hilar lymphadenopathy. The 8 mm subcarinal lymph node measured previously is 9 mm on the current study (image 24 series 2).  Lungs / Pleura: The previously measured right middle lobe lesion is unchanged. The previous study was measured on soft tissue windows at 8 x 15 mm. On soft tissue windows today (image 24 series 2) same lesion measures 8 x 14 mm. Lung windows show interval increase in posterior right upper lobe airspace disease with more advanced architectural distortion and pleural-parenchymal scarring in the lateral and right anterior have hemi thorax. Right middle lobe consolidative change has progressed with some further volume loss associated (compared image 26 series 5 today to image 30 series 5 previously). Several calcified granulomata are noted in the left lower lobe, as before. Airspace opacity in the anterior left upper lobe has progressed in the interval.  Bones: Bone windows reveal no worrisome lytic or sclerotic osseous lesions.  Upper Abdomen: Unremarkable. The 4 mm hypo attenuating structure seen in the left hepatic lobe on the previous study is less evident today.  IMPRESSION: 1. Stable previously measured right middle lobe lung lesion with stable index subcarinal lymph node. 2. Interval increase in a amorphous soft tissue around the right mainstem bronchus. While this may be reactive, recurrent disease cannot be excluded in close attention on followup imaging is recommended. 3. Interval increase in areas of airspace consolidation in the right upper and lower lungs as well as left upper lobe anteriorly. These changes may reflect sequelae from treatment, but infection or tumor cannot be excluded given the distribution and continued progression.   Electronically Signed   By: EMisty StanleyM.D.   On: 07/27/2014 12:17   ASSESSMENT AND PLAN:  1) unresectable stage IIIA non-small cell lung cancer:  This is a very pleasant 73years old white female  with unresectable stage IIIa non-small cell lung cancer completed a course of concurrent chemoradiation with weekly carboplatin and paclitaxel status post 6 cycles.  This is followed by 3 cycles of consolidation chemotherapy with reduced dose carboplatin and paclitaxel. The recent CT scan of the chest showed stable disease with increase and the soft tissue around the right mainstem bronchus patient to be reactive in nature but malignancy could not be excluded. She also has some areas of airspace consolidation secondary to prior treatment. I discussed the scan results with the patient. I recommended for her to continue on observation with repeat CT scan of the chest in 3 months with restaging of her disease. She was advised to call immediately if she has any concerning symptoms in the interval. The  patient voices understanding of current disease status and treatment options and is in agreement with the current care plan.  All questions were answered. The patient knows to call the clinic with any problems, questions or concerns. We can certainly see the patient much sooner if necessary.  Disclaimer: This note was dictated with voice recognition software. Similar sounding words can inadvertently be transcribed and may not be corrected upon review.

## 2014-07-28 NOTE — Telephone Encounter (Signed)
gv adn printed appt sched and avs for pt for FEb 2016

## 2014-09-11 DIAGNOSIS — Z1231 Encounter for screening mammogram for malignant neoplasm of breast: Secondary | ICD-10-CM | POA: Diagnosis not present

## 2014-09-14 DIAGNOSIS — E780 Pure hypercholesterolemia: Secondary | ICD-10-CM | POA: Diagnosis not present

## 2014-09-14 DIAGNOSIS — Z0000 Encounter for general adult medical examination without abnormal findings: Secondary | ICD-10-CM | POA: Diagnosis not present

## 2014-09-14 DIAGNOSIS — I10 Essential (primary) hypertension: Secondary | ICD-10-CM | POA: Diagnosis not present

## 2014-09-14 DIAGNOSIS — E039 Hypothyroidism, unspecified: Secondary | ICD-10-CM | POA: Diagnosis not present

## 2014-09-14 DIAGNOSIS — D649 Anemia, unspecified: Secondary | ICD-10-CM | POA: Diagnosis not present

## 2014-09-18 DIAGNOSIS — Z0000 Encounter for general adult medical examination without abnormal findings: Secondary | ICD-10-CM | POA: Diagnosis not present

## 2014-09-18 DIAGNOSIS — E780 Pure hypercholesterolemia: Secondary | ICD-10-CM | POA: Diagnosis not present

## 2014-09-18 DIAGNOSIS — E032 Hypothyroidism due to medicaments and other exogenous substances: Secondary | ICD-10-CM | POA: Diagnosis not present

## 2014-09-18 DIAGNOSIS — C3490 Malignant neoplasm of unspecified part of unspecified bronchus or lung: Secondary | ICD-10-CM | POA: Diagnosis not present

## 2014-09-24 ENCOUNTER — Telehealth: Payer: Self-pay | Admitting: Internal Medicine

## 2014-09-24 DIAGNOSIS — R928 Other abnormal and inconclusive findings on diagnostic imaging of breast: Secondary | ICD-10-CM | POA: Diagnosis not present

## 2014-09-24 MED ORDER — AMIODARONE HCL 200 MG PO TABS
200.0000 mg | ORAL_TABLET | Freq: Every day | ORAL | Status: DC
Start: 2014-09-24 — End: 2014-12-27

## 2014-09-24 NOTE — Telephone Encounter (Signed)
Clarified med, amlodipine is not listed in pt's Rx. She had meant amiodarone. Did verify spelling of medication and dosage w/ pt. This refill submitted to [facility].

## 2014-09-24 NOTE — Telephone Encounter (Signed)
°  1. Which medications need to be refilled? Amlodipine   2. Which pharmacy is medication to be sent to? [facility]  3. Do they need a 30 day or 90 day supply? 90  4. Would they like a call back once the medication has been sent to the pharmacy? no

## 2014-10-27 ENCOUNTER — Other Ambulatory Visit (HOSPITAL_BASED_OUTPATIENT_CLINIC_OR_DEPARTMENT_OTHER): Payer: Medicare Other

## 2014-10-27 ENCOUNTER — Encounter (HOSPITAL_COMMUNITY): Payer: Self-pay

## 2014-10-27 ENCOUNTER — Ambulatory Visit (HOSPITAL_COMMUNITY)
Admission: RE | Admit: 2014-10-27 | Discharge: 2014-10-27 | Disposition: A | Discharge status: Home / Self Care | Payer: Medicare Other | Source: Ambulatory Visit | Attending: Internal Medicine | Admitting: Internal Medicine

## 2014-10-27 DIAGNOSIS — C342 Malignant neoplasm of middle lobe, bronchus or lung: Secondary | ICD-10-CM | POA: Diagnosis not present

## 2014-10-27 DIAGNOSIS — Z9221 Personal history of antineoplastic chemotherapy: Secondary | ICD-10-CM | POA: Diagnosis not present

## 2014-10-27 DIAGNOSIS — Z923 Personal history of irradiation: Secondary | ICD-10-CM | POA: Diagnosis not present

## 2014-10-27 DIAGNOSIS — Z08 Encounter for follow-up examination after completed treatment for malignant neoplasm: Secondary | ICD-10-CM | POA: Diagnosis not present

## 2014-10-27 DIAGNOSIS — R918 Other nonspecific abnormal finding of lung field: Secondary | ICD-10-CM | POA: Diagnosis not present

## 2014-10-27 LAB — COMPREHENSIVE METABOLIC PANEL (CC13)
ALT: 29 U/L (ref 0–55)
AST: 28 U/L (ref 5–34)
Albumin: 3.4 g/dL — ABNORMAL LOW (ref 3.5–5.0)
Alkaline Phosphatase: 134 U/L (ref 40–150)
Anion Gap: 8 mEq/L (ref 3–11)
BUN: 21 mg/dL (ref 7.0–26.0)
CHLORIDE: 107 meq/L (ref 98–109)
CO2: 27 mEq/L (ref 22–29)
CREATININE: 1 mg/dL (ref 0.6–1.1)
Calcium: 9.5 mg/dL (ref 8.4–10.4)
EGFR: 57 mL/min/{1.73_m2} — ABNORMAL LOW (ref >90)
Glucose: 90 mg/dl (ref 70–140)
Potassium: 4.7 mEq/L (ref 3.5–5.1)
Sodium: 142 mEq/L (ref 136–145)
Total Bilirubin: 0.3 mg/dL (ref 0.20–1.20)
Total Protein: 6.4 g/dL (ref 6.4–8.3)

## 2014-10-27 LAB — CBC WITH DIFFERENTIAL/PLATELET
BASO%: 1.2 % (ref 0.0–2.0)
BASOS ABS: 0.1 10*3/uL (ref 0.0–0.1)
EOS%: 3.5 % (ref 0.0–7.0)
Eosinophils Absolute: 0.2 10*3/uL (ref 0.0–0.5)
HEMATOCRIT: 34.2 % — AB (ref 34.8–46.6)
HGB: 11.2 g/dL — ABNORMAL LOW (ref 11.6–15.9)
LYMPH%: 21.9 % (ref 14.0–49.7)
MCH: 32.3 pg (ref 25.1–34.0)
MCHC: 32.7 g/dL (ref 31.5–36.0)
MCV: 98.8 fL (ref 79.5–101.0)
MONO#: 0.5 10*3/uL (ref 0.1–0.9)
MONO%: 8.5 % (ref 0.0–14.0)
NEUT#: 4.1 10*3/uL (ref 1.5–6.5)
NEUT%: 64.9 % (ref 38.4–76.8)
PLATELETS: 290 10*3/uL (ref 145–400)
RBC: 3.46 10*6/uL — ABNORMAL LOW (ref 3.70–5.45)
RDW: 14.2 % (ref 11.2–14.5)
WBC: 6.3 10*3/uL (ref 3.9–10.3)
lymph#: 1.4 10*3/uL (ref 0.9–3.3)

## 2014-10-27 MED ORDER — IOHEXOL 300 MG/ML  SOLN
80.0000 mL | Freq: Once | INTRAMUSCULAR | Status: AC | PRN
  Administered 2014-10-27: 80 mL via INTRAVENOUS

## 2014-10-29 ENCOUNTER — Telehealth: Payer: Self-pay | Admitting: Internal Medicine

## 2014-10-29 ENCOUNTER — Ambulatory Visit (HOSPITAL_BASED_OUTPATIENT_CLINIC_OR_DEPARTMENT_OTHER): Payer: Medicare Other | Admitting: Internal Medicine

## 2014-10-29 ENCOUNTER — Encounter: Payer: Self-pay | Admitting: Internal Medicine

## 2014-10-29 VITALS — BP 153/56 | HR 71 | Temp 98.5°F | Resp 18 | Ht 59.0 in | Wt 143.6 lb

## 2014-10-29 DIAGNOSIS — C342 Malignant neoplasm of middle lobe, bronchus or lung: Secondary | ICD-10-CM | POA: Diagnosis not present

## 2014-10-29 NOTE — Telephone Encounter (Signed)
Gave avs & calendar for June. °

## 2014-10-29 NOTE — Progress Notes (Signed)
[place] Telephone:(336) [phone]   Fax:(336) [phone]  OFFICE PROGRESS NOTE  [name], MD [address] [place] [address]  DIAGNOSIS: Unresectable, likely a stage IIIA (T2a, N2, M0) non-small cell lung cancer, squamous cell carcinoma diagnosed in February 2015.  PRIOR THERAPY:  1) On 11/13/2013 the patient underwent exploratory right VATS with mediastinal biopsy under the care of Dr. Roxan Hockey. 2) Concurrent chemoradiation with weekly carboplatin for AUC of 2 and paclitaxel 45 mg/M2, status post 6 cycles with partial response. First cycle was given on 12/01/2013. 3) Consolidation chemotherapy with carboplatin for AUC of 5 and paclitaxel 175 mg/M2 every 3 weeks with Neulasta support. First dose 02/23/2014. Status post 3 cycles with partial response.   CURRENT THERAPY: Observation.  INTERVAL HISTORY: Kimberly [name] 74 y.o. female returns to the clinic today for followup visit. The patient has been on observation for the last 6 months. She is feeling much better today with no significant complaints. She denied having any significant nausea or vomiting, no fever or chills. She denied having any chest pain, shortness of breath, cough or hemoptysis. She had repeat CT scan of the chest performed recently and she is here for evaluation and discussion of her scan results.  MEDICAL HISTORY: Past Medical History  Diagnosis Date  . Hypertension   . Thyroid disease   . GERD (gastroesophageal reflux disease)   . Dyslipidemia   . Aortic insufficiency   . Mitral insufficiency   . History of nuclear stress test 02/26/2006    exercise myoview; normal pattern of perfusion; low risk scan   . PONV (postoperative nausea and vomiting)   . Heart murmur   . Hypothyroidism   . Cough     dry, endobronchial mass  . Pneumonia     pus bronchitis  . Arthritis   . Syncope     "states she has passed out a few times. dr is trying to find cause.last  time when geeting ready to go home after video bronch/bx  . Shortness of breath     with exertion  . Anxiety     due to surgery   . Bronchitis   . Radiation 12/01/13-01/08/14    50.4 gray to right central chest  . Atrial fibrillation   . Squamous cell carcinoma of lung     ALLERGIES:  is allergic to milk-related compounds.  MEDICATIONS:  Current Outpatient Prescriptions  Medication Sig Dispense Refill  . albuterol (PROVENTIL HFA;VENTOLIN HFA) 108 (90 BASE) MCG/ACT inhaler Inhale 1 puff into the lungs every 6 (six) hours as needed for wheezing or shortness of breath.    [name] amiodarone (PACERONE) 200 MG tablet Take 1 tablet (200 mg total) by mouth daily. 90 tablet 0  . atorvastatin (LIPITOR) 20 MG tablet Take 20 mg by mouth every morning.     [name] levothyroxine (SYNTHROID, LEVOTHROID) 125 MCG tablet Take 125 mcg by mouth daily before breakfast.    . metoprolol succinate (TOPROL-XL) 25 MG 24 hr tablet Take 0.5 tablets (12.5 mg total) by mouth daily. 30 tablet 4  . montelukast (SINGULAIR) 10 MG tablet Take 10 mg by mouth daily.    [name] EPINEPHrine 0.3 mg/0.3 mL IJ SOAJ injection Inject 0.3 mg into the muscle once as needed (reaction to milk products).     No current facility-administered medications for this visit.    SURGICAL HISTORY:  Past Surgical History  Procedure Laterality Date  . Thyroidectomy  1973  . Dilation and  curettage of uterus    . Carpal tunnel release Right 1988  . Rotator cuff repair  2006    ? side  . H/o met test w/pft  04/02/2012    low risk; peak VO2 77% predicted  . Cardiac catheterization  07/08/2008    normal coronaries  . Transthoracic echocardiogram  09/25/2012    EF 55-60%; mild LVH & mild concentric hypertrophy; mild AV regurg; RV systolic pressure increase consistent with mild pulm HTN  . Video bronchoscopy Bilateral 09/25/2013    Procedure: VIDEO BRONCHOSCOPY WITHOUT FLUORO;  Surgeon: Tanda Rockers, MD;  Location: [facility];  Service: Cardiopulmonary;   Laterality: Bilateral;  . Joint replacement  2003    thumb rt  . Knee arthroscopy  12    rt meniscus  . Video bronchoscopy with endobronchial ultrasound N/A 10/30/2013    Procedure: VIDEO BRONCHOSCOPY WITH ENDOBRONCHIAL ULTRASOUND;  Surgeon: Melrose Nakayama, MD;  Location: [facility];  Service: Thoracic;  Laterality: N/A;  . Mediastinoscopy N/A 10/30/2013    Procedure: MEDIASTINOSCOPY;  Surgeon: Melrose Nakayama, MD;  Location: [facility];  Service: Thoracic;  Laterality: N/A;  . Eye surgery Bilateral   . Video assisted thoracoscopy (vats)/ lobectomy Right 11/13/2013    Procedure: VIDEO ASSISTED THORACOSCOPY (VATS) with mediastinal  biopsies;  Surgeon: Melrose Nakayama, MD;  Location: [facility];  Service: Thoracic;  Laterality: Right;  RIGHT VATS,mediastinal biopsies    REVIEW OF SYSTEMS:  A comprehensive review of systems was negative.   PHYSICAL EXAMINATION: General appearance: alert, cooperative, fatigued and no distress Head: Normocephalic, without obvious abnormality, atraumatic Neck: no adenopathy, no JVD, supple, symmetrical, trachea midline and thyroid not enlarged, symmetric, no tenderness/mass/nodules Lymph nodes: Cervical, supraclavicular, and axillary nodes normal. Resp: clear to auscultation bilaterally Back: symmetric, no curvature. ROM normal. No CVA tenderness. Cardio: regular rate and rhythm, S1, S2 normal, no murmur, click, rub or gallop GI: soft, non-tender; bowel sounds normal; no masses,  no organomegaly Extremities: extremities normal, atraumatic, no cyanosis or edema Neurologic: Alert and oriented X 3, normal strength and tone. Normal symmetric reflexes. Normal coordination and gait  ECOG PERFORMANCE STATUS: 1 - Symptomatic but completely ambulatory  Blood pressure 153/56, pulse 71, temperature 98.5 F (36.9 C), temperature source Oral, resp. rate 18, height 4' 11" (1.499 m), weight 143 lb 9.6 oz (65.137 kg), SpO2 99 %.  LABORATORY DATA: Lab Results  Component  Value Date   WBC 6.3 10/27/2014   HGB 11.2* 10/27/2014   HCT 34.2* 10/27/2014   MCV 98.8 10/27/2014   PLT 290 10/27/2014      Chemistry      Component Value Date/Time   NA 142 10/27/2014 1051   NA 131* 04/13/2014 0814   K 4.7 10/27/2014 1051   K 4.5 04/13/2014 0814   CL 96 04/13/2014 0814   CO2 27 10/27/2014 1051   CO2 24 04/13/2014 0814   BUN 21.0 10/27/2014 1051   BUN 16 04/13/2014 0814   CREATININE 1.0 10/27/2014 1051   CREATININE 1.01 04/13/2014 0814      Component Value Date/Time   CALCIUM 9.5 10/27/2014 1051   CALCIUM 9.3 04/13/2014 0814   ALKPHOS 134 10/27/2014 1051   ALKPHOS 82 04/13/2014 0814   AST 28 10/27/2014 1051   AST 17 04/13/2014 0814   ALT 29 10/27/2014 1051   ALT 27 04/13/2014 0814   BILITOT 0.30 10/27/2014 1051   BILITOT 0.6 04/13/2014 0814       RADIOGRAPHIC STUDIES: Ct Chest W Contrast  10/27/2014  CLINICAL DATA:  Right middle lobe lung cancer. Completed chemotherapy and radiation therapy in August, 2015. Adhesions prevented lobectomy.  EXAM: CT CHEST WITH CONTRAST  TECHNIQUE: Multidetector CT imaging of the chest was performed during intravenous contrast administration.  CONTRAST:  26m OMNIPAQUE IOHEXOL 300 MG/ML  SOLN  COMPARISON:  Multiple exams, including 07/27/2014  FINDINGS: Mediastinum/Nodes: Hazy density surrounds the lower trachea, and subcarinal region with soft tissue density encasing much of the right mainstem bronchus in a manner very similar to the prior exam. Tissue planes are indistinct and discrete lymph nodes are not early measurable in this vicinity, and even the thickness of the soft tissues posterior to the mainstem bronchus are variable in degree although seemingly slightly thicker, at about 9 mm in thickness on image 20 of series 2 compared to 6 mm on the prior exam. There is some volume loss in the right hemithorax.  Lungs/Pleura: There atelectasis and airspace opacity in the superior segment right lower lobe and in the right  middle lobe and anterior segment right lower lobe, with slightly increased airspace opacity/air bronchograms in the superior segment right lower lobe and upper portion of the right middle lobe, and bandlike opacities in this vicinity with narrowing of the right middle lobe airway similar to prior. Some of the nodularity in the posterior basal segment right lower lobe has resolved although some of the faint nodularity in this area persists.  There is evidence of old granulomatous disease in the left lower lobe.  Upper abdomen: Unremarkable  Musculoskeletal: Lipoma in the left infraspinatus muscle. Lower thoracic spondylosis and degenerative disc disease noted.  IMPRESSION: 1. Slightly increased right middle lobe and superior segment right lower lobe airspace opacity favoring radiation pneumonitis and/or fibrosis. There is continued indistinct hazy density around the lower trachea, carina, and right mainstem bronchus, minimally thicker posterior to the right mainstem bronchus compared to before, which could be an inflammatory response or due to progressive tumor. 2. Most of the posterior basal segment right lower lobe nodularity has resolved although there is some faint residual nodularity which merits observation. 3. Old granulomatous disease in the left lung.   Electronically Signed   By: WVan ClinesM.D.   On: 10/27/2014 15:00   ASSESSMENT AND PLAN:  1) unresectable stage IIIA non-small cell lung cancer:  This is a very pleasant 74years old white female with unresectable stage IIIA non-small cell lung cancer completed a course of concurrent chemoradiation with weekly carboplatin and paclitaxel status post 6 cycles.  This is followed by 3 cycles of consolidation chemotherapy with reduced dose carboplatin and paclitaxel. The recent CT scan of the chest showed no evidence for disease progression but increased and the radiation induced pneumonitis in the right middle and lower lobes. I discussed the scan  results with the patient. I recommended for her to continue on observation with repeat CT scan of the chest in 4 months with restaging of her disease. She was advised to call immediately if she has any concerning symptoms in the interval. The patient voices understanding of current disease status and treatment options and is in agreement with the current care plan.  All questions were answered. The patient knows to call the clinic with any problems, questions or concerns. We can certainly see the patient much sooner if necessary.  Disclaimer: This note was dictated with voice recognition software. Similar sounding words can inadvertently be transcribed and may not be corrected upon review.

## 2014-11-04 ENCOUNTER — Ambulatory Visit (INDEPENDENT_AMBULATORY_CARE_PROVIDER_SITE_OTHER): Payer: Medicare Other | Admitting: Internal Medicine

## 2014-11-04 ENCOUNTER — Encounter: Payer: Self-pay | Admitting: Internal Medicine

## 2014-11-04 VITALS — BP 162/74 | HR 63 | Ht 59.0 in | Wt 145.6 lb

## 2014-11-04 DIAGNOSIS — I10 Essential (primary) hypertension: Secondary | ICD-10-CM | POA: Diagnosis not present

## 2014-11-04 DIAGNOSIS — C342 Malignant neoplasm of middle lobe, bronchus or lung: Secondary | ICD-10-CM

## 2014-11-04 DIAGNOSIS — E785 Hyperlipidemia, unspecified: Secondary | ICD-10-CM

## 2014-11-04 DIAGNOSIS — I480 Paroxysmal atrial fibrillation: Secondary | ICD-10-CM | POA: Diagnosis not present

## 2014-11-04 DIAGNOSIS — H4601 Optic papillitis, right eye: Secondary | ICD-10-CM | POA: Diagnosis not present

## 2014-11-04 DIAGNOSIS — I4891 Unspecified atrial fibrillation: Secondary | ICD-10-CM | POA: Diagnosis not present

## 2014-11-04 MED ORDER — METOPROLOL SUCCINATE ER 25 MG PO TB24: 12.5000 mg | ORAL_TABLET | Freq: Every day | ORAL | Status: DC

## 2014-11-04 MED ORDER — LOSARTAN POTASSIUM 25 MG PO TABS: 25.0000 mg | ORAL_TABLET | Freq: Every day | ORAL | Status: DC

## 2014-11-04 NOTE — Progress Notes (Signed)
OFFICE NOTE  Chief Complaint:  Stable shortness of breath  Primary Care Physician: [name], MD  HPI:  Kimberly [name] is a 74 year old female who recently has been having some chest discomfort and shortness of breath as well as 2 episodes of syncope, both of which were during stressful situations, once while on a ladder. We went ahead and checked an echocardiogram which essentially shows only mild abnormalities. She did have a loudly calcified aortic valve which was not stenotic; however, there was mild regurgitation. There was mild LVH with EF 55% to 60% and grade 1 diastolic dysfunction. She had borderline pulmonary hypertension. A monitor was also worn by her from September 13, 2012 to October 12, 2012. This showed a predominantly sinus rhythm with PACs. There was, however, 1 episode of a nonsustained ventricular tachycardia which was about 5 beats. Of course she potentially would have to had longer episodes of NSVT this could have been playing a role in her syncopal event. I do not think she, however, is at high risk for lethal sustained VT given her normal ejection fraction and no clear evidence of ischemia. As you recall, she had a low-risk MET-test on April 02, 2012, with a peak VO2 of 77% predicted without any evidence of an ischemic response. This would make it quite unlikely that she has underlying coronary blockage that is responsible for her episode. I have, however, recommended adding a beta blocker to her regimen which will additionally help control her recent uncontrolled hypertension. This is Toprol XL 12.5 mg daily. It should help also with the VT. she is continuing to take low-dose Toprol and has had no further episodes of passing out in the past 6 months. She was thoroughly evaluated by a neurologist and felt to possibly be having a vasodepressive syncope. There was lower extremity.swelling and compression stockings were recommended.  Kimberly [name] returns today for  followup. She has been hospitalized a number of times since getting a diagnosis of lung cancer. 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 Unfortunately she developed A. fib with RVR and was treated and placed on amiodarone. She's been having problems with recurrent vasovagal syncope and hypotension. She does have a mass that is pressing against the atrium.  I saw Kimberly [name] back in the office today. She is undergoing chemotherapy which is cause some mild shrinkage of her tumor however it is not disappeared, and apparently is incurable and unresectable. Fortunate she's had no recurrence of her A. fib and remains on low-dose amiodarone. She says that she feels better than she had when she was undergoing radiation chemotherapy. She has no new complaints.  PMHx:  Past Medical History  Diagnosis Date  . Hypertension   . Thyroid disease   . GERD (gastroesophageal reflux disease)   . Dyslipidemia   . Aortic insufficiency   . Mitral insufficiency   . History of nuclear stress test 02/26/2006    exercise myoview; normal pattern of perfusion; low risk scan   . PONV (postoperative nausea and vomiting)   . Heart murmur   . Hypothyroidism   . Cough     dry, endobronchial mass  . Pneumonia     pus bronchitis  . Arthritis   . Syncope     "states she has passed out a few times. dr is trying to find cause.last time when geeting ready to go home after video bronch/bx  . Shortness of breath     with  exertion  . Anxiety     due to surgery   . Bronchitis   . Radiation 12/01/13-01/08/14    50.4 gray to right central chest  . Atrial fibrillation   . Squamous cell carcinoma of lung     Past Surgical History  Procedure Laterality Date  . Thyroidectomy  1973  . Dilation and curettage of uterus    . Carpal tunnel release Right 1988  . Rotator cuff repair  2006    ? side  . H/o met test w/pft  04/02/2012    low risk; peak VO2 77% predicted  . Cardiac  catheterization  07/08/2008    normal coronaries  . Transthoracic echocardiogram  09/25/2012    EF 55-60%; mild LVH & mild concentric hypertrophy; mild AV regurg; RV systolic pressure increase consistent with mild pulm HTN  . Video bronchoscopy Bilateral 09/25/2013    Procedure: VIDEO BRONCHOSCOPY WITHOUT FLUORO;  Surgeon: Tanda Rockers, MD;  Location: [facility];  Service: Cardiopulmonary;  Laterality: Bilateral;  . Joint replacement  2003    thumb rt  . Knee arthroscopy  12    rt meniscus  . Video bronchoscopy with endobronchial ultrasound N/A 10/30/2013    Procedure: VIDEO BRONCHOSCOPY WITH ENDOBRONCHIAL ULTRASOUND;  Surgeon: Melrose Nakayama, MD;  Location: [facility];  Service: Thoracic;  Laterality: N/A;  . Mediastinoscopy N/A 10/30/2013    Procedure: MEDIASTINOSCOPY;  Surgeon: Melrose Nakayama, MD;  Location: [facility];  Service: Thoracic;  Laterality: N/A;  . Eye surgery Bilateral   . Video assisted thoracoscopy (vats)/ lobectomy Right 11/13/2013    Procedure: VIDEO ASSISTED THORACOSCOPY (VATS) with mediastinal  biopsies;  Surgeon: Melrose Nakayama, MD;  Location: [facility];  Service: Thoracic;  Laterality: Right;  RIGHT VATS,mediastinal biopsies    FAMHx:  Family History  Problem Relation Age of Onset  . Lung cancer Mother   . Heart attack Father   . Heart failure Maternal Grandfather   . Heart attack Paternal Grandfather     SOCHx:   reports that she quit smoking about 47 years ago. Her smoking use included Cigarettes. She has a 3 pack-year smoking history. She has never used smokeless tobacco. She reports that she drinks alcohol. She reports that she does not use illicit drugs.  ALLERGIES:  Allergies  Allergen Reactions  . Milk-Related Compounds Other (See Comments)    REACTION: GI upset, projectile vomiting    ROS: A comprehensive review of systems was negative except for: Cardiovascular: positive for lower extremity edema and syncope  HOME MEDS: Current Outpatient  Prescriptions  Medication Sig Dispense Refill  . albuterol (PROVENTIL HFA;VENTOLIN HFA) 108 (90 BASE) MCG/ACT inhaler Inhale 1 puff into the lungs every 6 (six) hours as needed for wheezing or shortness of breath.    [name] amiodarone (PACERONE) 200 MG tablet Take 1 tablet (200 mg total) by mouth daily. 90 tablet 0  . atorvastatin (LIPITOR) 20 MG tablet Take 20 mg by mouth every morning.     [name] EPINEPHrine 0.3 mg/0.3 mL IJ SOAJ injection Inject 0.3 mg into the muscle once as needed (reaction to milk products).    [name] levothyroxine (SYNTHROID, LEVOTHROID) 125 MCG tablet Take 125 mcg by mouth daily before breakfast.    . metoprolol succinate (TOPROL-XL) 25 MG 24 hr tablet Take 0.5 tablets (12.5 mg total) by mouth daily. 45 tablet 3  . montelukast (SINGULAIR) 10 MG tablet Take 10 mg by mouth daily.    [name] losartan (COZAAR) 25 MG tablet Take 1 tablet (  25 mg total) by mouth daily. 90 tablet 3   No current facility-administered medications for this visit.    LABS/IMAGING: No results found for this or any previous visit (from the past 48 hour(s)). No results found.  VITALS: BP 162/74 mmHg  Pulse 63  Ht _0  (1.499 m)  Wt 145 lb 9.6 oz (66.044 kg)  BMI 29.39 kg/m2  EXAM: GEN: Awake, nad HEENT: PERRLA, EOMI LUNGS: Clear bilaterally CV: RRR, s1/s2, no MRG's ABD: S/NT, +BS EXT: No edema NEURO: A&O x 3, grossly non-focal SKIN: DRY, warm, no rashes PSYCH: Mood, affect normal  EKG: Normal sinus rhythm at 63  ASSESSMENT: 1. Lung cancer (non-small cell stage III) 2. Recurrent vasodepressor syncope  3. Lower extremity edema 4. Hypertension - recently hypotensive 5. Paroxysmal atrial fibrillation on amiodarone  PLAN: 1.   Kimberly [name] recently has been feeling better without any recurrent syncopal episodes. Her A. fib is been controlled on amiodarone. Her blood pressure is now recovering to the point where I think we could restart her losartan at 25 mg daily. Unfortunately the prognosis for  cancer is not good. She is aware that and is grateful for his many days as she may have. I'm happy to see her back in about 6 months and will continue her current therapy for now.  Pixie Casino, MD, [facility] Attending Cardiologist [facility] [place] [facility] 11/04/2014, 6:01 PM

## 2014-11-04 NOTE — Patient Instructions (Signed)
Dr.Hilty wants you to follow-up in: SIX MONTHS. You will receive a reminder letter in the mail two months in advance. If you don't receive a letter, please call our office to schedule the follow-up appointment.  START Losartan 25 mg daily

## 2014-11-05 ENCOUNTER — Telehealth: Payer: Self-pay | Admitting: *Deleted

## 2014-11-05 DIAGNOSIS — I10 Essential (primary) hypertension: Secondary | ICD-10-CM | POA: Diagnosis not present

## 2014-11-05 DIAGNOSIS — H5340 Unspecified visual field defects: Secondary | ICD-10-CM | POA: Diagnosis not present

## 2014-11-05 DIAGNOSIS — Z6829 Body mass index (BMI) 29.0-29.9, adult: Secondary | ICD-10-CM | POA: Diagnosis not present

## 2014-11-05 NOTE — Telephone Encounter (Signed)
Call from patient with status update and advice on eye care.  "Having trouble blurred vision in right that progressed to loss of vision in right eye.  Saw Dr. 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  Being referred to [facility] for MRI to verify this is not a cancer that has spread.  Family encouraged me to let Dr. Julien Nordmann know.  Am I doing the right thing?  I am waiting for a call for next appointments." This nurse assured her to follow eye doctor's treatment plans and this nurse will notify Dr, Julien Nordmann.

## 2014-11-07 DIAGNOSIS — H538 Other visual disturbances: Secondary | ICD-10-CM | POA: Diagnosis not present

## 2014-11-09 DIAGNOSIS — H5340 Unspecified visual field defects: Secondary | ICD-10-CM | POA: Diagnosis not present

## 2014-11-11 DIAGNOSIS — H5340 Unspecified visual field defects: Secondary | ICD-10-CM | POA: Diagnosis not present

## 2014-11-17 DIAGNOSIS — H5340 Unspecified visual field defects: Secondary | ICD-10-CM | POA: Diagnosis not present

## 2014-11-17 DIAGNOSIS — Z6829 Body mass index (BMI) 29.0-29.9, adult: Secondary | ICD-10-CM | POA: Diagnosis not present

## 2014-12-15 DIAGNOSIS — H47291 Other optic atrophy, right eye: Secondary | ICD-10-CM | POA: Diagnosis not present

## 2014-12-15 DIAGNOSIS — H53431 Sector or arcuate defects, right eye: Secondary | ICD-10-CM | POA: Diagnosis not present

## 2014-12-15 DIAGNOSIS — H5340 Unspecified visual field defects: Secondary | ICD-10-CM | POA: Diagnosis not present

## 2014-12-15 DIAGNOSIS — Z961 Presence of intraocular lens: Secondary | ICD-10-CM | POA: Diagnosis not present

## 2014-12-15 DIAGNOSIS — H468 Other optic neuritis: Secondary | ICD-10-CM | POA: Diagnosis not present

## 2014-12-15 DIAGNOSIS — I10 Essential (primary) hypertension: Secondary | ICD-10-CM | POA: Diagnosis not present

## 2014-12-15 DIAGNOSIS — Z87891 Personal history of nicotine dependence: Secondary | ICD-10-CM | POA: Diagnosis not present

## 2014-12-15 DIAGNOSIS — Z85118 Personal history of other malignant neoplasm of bronchus and lung: Secondary | ICD-10-CM | POA: Diagnosis not present

## 2014-12-15 DIAGNOSIS — H47011 Ischemic optic neuropathy, right eye: Secondary | ICD-10-CM | POA: Diagnosis not present

## 2014-12-16 ENCOUNTER — Telehealth: Payer: Self-pay | Admitting: Internal Medicine

## 2014-12-16 NOTE — Telephone Encounter (Signed)
Fine with me.  Dr. Lemmie Evens

## 2014-12-16 NOTE — Telephone Encounter (Signed)
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

## 2014-12-16 NOTE — Telephone Encounter (Signed)
Pt have been diagnosed with AION. Her question is, can she take an aspirin 81 mg? This was one of the doctor suggestion for this condition.

## 2014-12-17 NOTE — Telephone Encounter (Signed)
Returned call to patient Dr.Hilty advised ok to take aspirin 81 mg daily.

## 2014-12-27 ENCOUNTER — Other Ambulatory Visit: Payer: Self-pay | Admitting: Internal Medicine

## 2014-12-28 NOTE — Telephone Encounter (Signed)
Rx has been sent to the pharmacy electronically. ° °

## 2015-01-26 DIAGNOSIS — H3400 Transient retinal artery occlusion, unspecified eye: Secondary | ICD-10-CM | POA: Diagnosis not present

## 2015-02-25 ENCOUNTER — Other Ambulatory Visit (HOSPITAL_BASED_OUTPATIENT_CLINIC_OR_DEPARTMENT_OTHER): Payer: Medicare Other

## 2015-02-25 ENCOUNTER — Ambulatory Visit (HOSPITAL_COMMUNITY)
Admission: RE | Admit: 2015-02-25 | Discharge: 2015-02-25 | Disposition: A | Discharge status: Home / Self Care | Payer: Medicare Other | Source: Ambulatory Visit | Attending: Internal Medicine | Admitting: Internal Medicine

## 2015-02-25 ENCOUNTER — Encounter (HOSPITAL_COMMUNITY): Payer: Self-pay

## 2015-02-25 DIAGNOSIS — D71 Functional disorders of polymorphonuclear neutrophils: Secondary | ICD-10-CM | POA: Diagnosis not present

## 2015-02-25 DIAGNOSIS — C342 Malignant neoplasm of middle lobe, bronchus or lung: Secondary | ICD-10-CM

## 2015-02-25 DIAGNOSIS — R05 Cough: Secondary | ICD-10-CM | POA: Diagnosis not present

## 2015-02-25 DIAGNOSIS — C3401 Malignant neoplasm of right main bronchus: Secondary | ICD-10-CM | POA: Diagnosis not present

## 2015-02-25 DIAGNOSIS — Z923 Personal history of irradiation: Secondary | ICD-10-CM | POA: Diagnosis not present

## 2015-02-25 DIAGNOSIS — Z9221 Personal history of antineoplastic chemotherapy: Secondary | ICD-10-CM | POA: Diagnosis not present

## 2015-02-25 LAB — COMPREHENSIVE METABOLIC PANEL (CC13)
ALBUMIN: 3.6 g/dL (ref 3.5–5.0)
ALT: 25 U/L (ref 0–55)
AST: 23 U/L (ref 5–34)
Alkaline Phosphatase: 122 U/L (ref 40–150)
Anion Gap: 5 mEq/L (ref 3–11)
BUN: 22.8 mg/dL (ref 7.0–26.0)
CO2: 29 mEq/L (ref 22–29)
Calcium: 9.5 mg/dL (ref 8.4–10.4)
Chloride: 107 mEq/L (ref 98–109)
Creatinine: 1.1 mg/dL (ref 0.6–1.1)
EGFR: 51 mL/min/{1.73_m2} — ABNORMAL LOW (ref >90)
Glucose: 83 mg/dl (ref 70–140)
POTASSIUM: 5.1 meq/L (ref 3.5–5.1)
Sodium: 140 mEq/L (ref 136–145)
Total Bilirubin: 0.51 mg/dL (ref 0.20–1.20)
Total Protein: 6.5 g/dL (ref 6.4–8.3)

## 2015-02-25 LAB — CBC WITH DIFFERENTIAL/PLATELET
BASO%: 0.8 % (ref 0.0–2.0)
BASOS ABS: 0.1 10*3/uL (ref 0.0–0.1)
EOS%: 4.1 % (ref 0.0–7.0)
Eosinophils Absolute: 0.3 10*3/uL (ref 0.0–0.5)
HCT: 33.7 % — ABNORMAL LOW (ref 34.8–46.6)
HGB: 11.2 g/dL — ABNORMAL LOW (ref 11.6–15.9)
LYMPH%: 19.2 % (ref 14.0–49.7)
MCH: 32.7 pg (ref 25.1–34.0)
MCHC: 33.2 g/dL (ref 31.5–36.0)
MCV: 98.3 fL (ref 79.5–101.0)
MONO#: 0.5 10*3/uL (ref 0.1–0.9)
MONO%: 7.3 % (ref 0.0–14.0)
NEUT#: 4.3 10*3/uL (ref 1.5–6.5)
NEUT%: 68.6 % (ref 38.4–76.8)
Platelets: 234 10*3/uL (ref 145–400)
RBC: 3.43 10*6/uL — ABNORMAL LOW (ref 3.70–5.45)
RDW: 13.8 % (ref 11.2–14.5)
WBC: 6.3 10*3/uL (ref 3.9–10.3)
lymph#: 1.2 10*3/uL (ref 0.9–3.3)

## 2015-02-25 MED ORDER — IOHEXOL 300 MG/ML  SOLN
100.0000 mL | Freq: Once | INTRAMUSCULAR | Status: AC | PRN
  Administered 2015-02-25: 80 mL via INTRAVENOUS

## 2015-03-04 ENCOUNTER — Telehealth: Payer: Self-pay | Admitting: Internal Medicine

## 2015-03-04 ENCOUNTER — Ambulatory Visit (HOSPITAL_BASED_OUTPATIENT_CLINIC_OR_DEPARTMENT_OTHER): Payer: Medicare Other | Admitting: Internal Medicine

## 2015-03-04 ENCOUNTER — Encounter: Payer: Self-pay | Admitting: Internal Medicine

## 2015-03-04 VITALS — BP 165/75 | HR 62 | Temp 98.7°F | Resp 18 | Ht 59.0 in | Wt 155.7 lb

## 2015-03-04 DIAGNOSIS — C342 Malignant neoplasm of middle lobe, bronchus or lung: Secondary | ICD-10-CM | POA: Diagnosis not present

## 2015-03-04 NOTE — Telephone Encounter (Signed)
Pt confirmed labs/ov per 06/30 POF, gave pt AVS and Calendar... KJ

## 2015-03-04 NOTE — Telephone Encounter (Signed)
Returned patient call re message left that her lab appoitnment should be one week prior to her f/u with MM. Moved f/u appointment from 10/27 to 11/3. Lab appointment remains the same on 10/28. Spoke with patient she is aware. [facility] radi[facility] scheduling will call patient re ct - patient aware.

## 2015-03-04 NOTE — Progress Notes (Signed)
[place] [phone]   [phone]  OFFICE PROGRESS NOTE  Kimberly Pel, MD [address] Kimberly [name]  [id number]  DIAGNOSIS: Unresectable, likely a stage IIIA (T2a, N2, M0) non-small cell lung cancer, squamous cell carcinoma diagnosed in February 2015.  PRIOR THERAPY:  1) On 11/13/2013 the patient underwent exploratory right VATS with mediastinal biopsy under the care of Dr. Roxan Hockey. 2) Concurrent chemoradiation with weekly carboplatin for AUC of 2 and paclitaxel 45 mg/M2, status post 6 cycles with partial response. First cycle was given on 12/01/2013. 3) Consolidation chemotherapy with carboplatin for AUC of 5 and paclitaxel 175 mg/M2 every 3 weeks with Neulasta support. First dose 02/23/2014. Status post 3 cycles with partial response.   CURRENT THERAPY: Observation.  INTERVAL HISTORY: Kimberly [name] 74 y.o. female returns to the clinic today for followup visit. The patient has been on observation for the last 12 months. She is feeling much better today with no significant complaints. She denied having any significant nausea or vomiting, no fever or chills. She denied having any chest pain, shortness of breath, cough or hemoptysis. No significant weight loss or night sweats. She had repeat CT scan of the chest performed recently and she is here for evaluation and discussion of her scan results.  MEDICAL HISTORY: Past Medical History  Diagnosis Date  . Hypertension   . Thyroid disease   . GERD (gastroesophageal reflux disease)   . Dyslipidemia   . Aortic insufficiency   . Mitral insufficiency   . History of nuclear stress test 02/26/2006    exercise myoview; normal pattern of perfusion; low risk scan   . PONV (postoperative nausea and vomiting)   . Heart murmur   . Hypothyroidism   . Cough     dry, endobronchial mass  . Pneumonia     pus bronchitis  . Arthritis   . Syncope     "states she has passed out  a few times. dr is trying to find cause.last time when geeting ready to go home after video bronch/bx  . Shortness of breath     with exertion  . Anxiety     due to surgery   . Bronchitis   . Radiation 12/01/13-01/08/14    50.4 gray to right central chest  . Atrial fibrillation   . Squamous cell carcinoma of lung     ALLERGIES:  is allergic to milk-related compounds.  MEDICATIONS:  Current Outpatient Prescriptions  Medication Sig Dispense Refill  . albuterol (PROVENTIL HFA;VENTOLIN HFA) 108 (90 BASE) MCG/ACT inhaler Inhale 1 puff into the lungs every 6 (six) hours as needed for wheezing or shortness of breath.    [name] amiodarone (PACERONE) 200 MG tablet Take 1 tablet by mouth  daily 90 tablet 2  . atorvastatin (LIPITOR) 20 MG tablet Take 20 mg by mouth every morning.     [name] EPINEPHrine 0.3 mg/0.3 mL IJ SOAJ injection Inject 0.3 mg into the muscle once as needed (reaction to milk products).    [name] levothyroxine (SYNTHROID, LEVOTHROID) 125 MCG tablet Take 125 mcg by mouth daily before breakfast.    . losartan (COZAAR) 25 MG tablet Take 1 tablet (25 mg total) by mouth daily. 90 tablet 3  . metoprolol succinate (TOPROL-XL) 25 MG 24 hr tablet Take 0.5 tablets (12.5 mg total) by mouth daily. 45 tablet 3  . montelukast (SINGULAIR) 10 MG tablet Take 10 mg by mouth daily.     No current facility-administered medications for  this visit.    SURGICAL HISTORY:  Past Surgical History  Procedure Laterality Date  . Thyroidectomy  1973  . Dilation and curettage of uterus    . Carpal tunnel release Right 1988  . Rotator cuff repair  2006    ? side  . H/o met test w/pft  04/02/2012    low risk; peak VO2 77% predicted  . Cardiac catheterization  07/08/2008    normal coronaries  . Transthoracic echocardiogram  09/25/2012    EF 55-60%; mild LVH & mild concentric hypertrophy; mild AV regurg; RV systolic pressure increase consistent with mild pulm HTN  . Video bronchoscopy Bilateral 09/25/2013    Procedure:  VIDEO BRONCHOSCOPY WITHOUT FLUORO;  Surgeon: Tanda Rockers, MD;  Location: [facility];  Service: Cardiopulmonary;  Laterality: Bilateral;  . Joint replacement  2003    thumb rt  . Knee arthroscopy  12    rt meniscus  . Video bronchoscopy with endobronchial ultrasound N/A 10/30/2013    Procedure: VIDEO BRONCHOSCOPY WITH ENDOBRONCHIAL ULTRASOUND;  Surgeon: Melrose Nakayama, MD;  Location: [place];  Service: Thoracic;  Laterality: N/A;  . Mediastinoscopy N/A 10/30/2013    Procedure: MEDIASTINOSCOPY;  Surgeon: Melrose Nakayama, MD;  Location: [place];  Service: Thoracic;  Laterality: N/A;  . Eye surgery Bilateral   . Video assisted thoracoscopy (vats)/ lobectomy Right 11/13/2013    Procedure: VIDEO ASSISTED THORACOSCOPY (VATS) with mediastinal  biopsies;  Surgeon: Melrose Nakayama, MD;  Location: [place];  Service: Thoracic;  Laterality: Right;  RIGHT VATS,mediastinal biopsies    REVIEW OF SYSTEMS:  A comprehensive review of systems was negative.   PHYSICAL EXAMINATION: General appearance: alert, cooperative, fatigued and no distress Head: Normocephalic, without obvious abnormality, atraumatic Neck: no adenopathy, no JVD, supple, symmetrical, trachea midline and thyroid not enlarged, symmetric, no tenderness/mass/nodules Lymph nodes: Cervical, supraclavicular, and axillary nodes normal. Resp: clear to auscultation bilaterally Back: symmetric, no curvature. ROM normal. No CVA tenderness. Cardio: regular rate and rhythm, S1, S2 normal, no murmur, click, rub or gallop GI: soft, non-tender; bowel sounds normal; no masses,  no organomegaly Extremities: extremities normal, atraumatic, no cyanosis or edema Neurologic: Alert and oriented X 3, normal strength and tone. Normal symmetric reflexes. Normal coordination and gait  ECOG PERFORMANCE STATUS: 1 - Symptomatic but completely ambulatory  Blood pressure 165/75, pulse 62, temperature 98.7 F (37.1 C), temperature source Oral, resp. rate 18,  height '4\' 11"'  (1.499 m), weight 155 lb 11.2 oz (70.625 kg), SpO2 99 %.  LABORATORY DATA: Lab Results  Component Value Date   WBC 6.3 02/25/2015   HGB 11.2* 02/25/2015   HCT 33.7* 02/25/2015   MCV 98.3 02/25/2015   PLT 234 02/25/2015      Chemistry      Component Value Date/Time   NA 140 02/25/2015 1042   NA 131* 04/13/2014 0814   K 5.1 02/25/2015 1042   K 4.5 04/13/2014 0814   CL 96 04/13/2014 0814   CO2 29 02/25/2015 1042   CO2 24 04/13/2014 0814   BUN 22.8 02/25/2015 1042   BUN 16 04/13/2014 0814   CREATININE 1.1 02/25/2015 1042   CREATININE 1.01 04/13/2014 0814      Component Value Date/Time   CALCIUM 9.5 02/25/2015 1042   CALCIUM 9.3 04/13/2014 0814   ALKPHOS 122 02/25/2015 1042   ALKPHOS 82 04/13/2014 0814   AST 23 02/25/2015 1042   AST 17 04/13/2014 0814   ALT 25 02/25/2015 1042   ALT 27 04/13/2014 6962  BILITOT 0.51 02/25/2015 1042   BILITOT 0.6 04/13/2014 8144       RADIOGRAPHIC STUDIES: Ct Chest W Contrast  02/25/2015   CLINICAL DATA:  Right middle lobe lung cancer diagnosed in February 2015. Chronic cough. Prior chemotherapy and radiation therapy in August 2015.  EXAM: CT CHEST WITH CONTRAST  TECHNIQUE: Multidetector CT imaging of the chest was performed during intravenous contrast administration.  CONTRAST:  3m OMNIPAQUE IOHEXOL 300 MG/ML  SOLN  COMPARISON:  10/27/2014  FINDINGS: Mediastinum/Nodes: Prior thyroidectomy. Subcarinal nodal tissue 1.2 cm in short axis on image 23 series 2, formerly 1.1 cm.  Lungs/Pleura: Volume loss in the right hemithorax with continued airspace opacity/consolidation around the right major fissure with involvement of the upper lobe, lower lobe, and especially middle lobe. Associated volume loss. I do not see a residual endobronchial tumor although given the degree of airspace opacity, small amounts of residual or recurrent tumor are difficult to exclude. There is no significant progression compared to the prior exam to suggest a  worsening process.  Stable old granulomatous disease in the left lower lobe. Stable minimal residual nodularity in the right lower lobe.  Upper abdomen: Unremarkable  Musculoskeletal: Unremarkable  IMPRESSION: 1. Stable appearance of the chest, likely representing primarily radiation pneumonitis and related atelectasis along the right major fissure and in the right middle lobe. No progressive tumor identified ; the degree of airspace opacity and atelectasis could disguise very small amounts of residual tumor. No pathologic adenopathy observed. 2. Old granulomatous disease in the left lower lobe.   Electronically Signed   By: WVan ClinesM.D.   On: 02/25/2015 13:40   ASSESSMENT AND PLAN:  1) unresectable stage IIIA non-small cell lung cancer:  This is a very pleasant 74years old white female with unresectable stage IIIA non-small cell lung cancer completed a course of concurrent chemoradiation with weekly carboplatin and paclitaxel status post 6 cycles.  This is followed by 3 cycles of consolidation chemotherapy with reduced dose carboplatin and paclitaxel. The recent CT scan of the chest showed no evidence for disease progression. She is feeling very well today. I discussed the scan results with the patient. I recommended for her to continue on observation with repeat CT scan of the chest in 4 months with restaging of her disease. She was advised to call immediately if she has any concerning symptoms in the interval. The patient voices understanding of current disease status and treatment options and is in agreement with the current care plan.  All questions were answered. The patient knows to call the clinic with any problems, questions or concerns. We can certainly see the patient much sooner if necessary.  Disclaimer: This note was dictated with voice recognition software. Similar sounding words can inadvertently be transcribed and may not be corrected upon review.

## 2015-04-16 ENCOUNTER — Encounter: Payer: Self-pay | Admitting: Internal Medicine

## 2015-04-16 ENCOUNTER — Ambulatory Visit (INDEPENDENT_AMBULATORY_CARE_PROVIDER_SITE_OTHER): Payer: Medicare Other | Admitting: Internal Medicine

## 2015-04-16 VITALS — BP 198/86 | HR 57 | Ht 59.0 in | Wt 155.2 lb

## 2015-04-16 DIAGNOSIS — E039 Hypothyroidism, unspecified: Secondary | ICD-10-CM

## 2015-04-16 DIAGNOSIS — E785 Hyperlipidemia, unspecified: Secondary | ICD-10-CM

## 2015-04-16 DIAGNOSIS — I480 Paroxysmal atrial fibrillation: Secondary | ICD-10-CM

## 2015-04-16 DIAGNOSIS — Z79899 Other long term (current) drug therapy: Secondary | ICD-10-CM | POA: Diagnosis not present

## 2015-04-16 DIAGNOSIS — R0602 Shortness of breath: Secondary | ICD-10-CM

## 2015-04-16 DIAGNOSIS — C342 Malignant neoplasm of middle lobe, bronchus or lung: Secondary | ICD-10-CM

## 2015-04-16 DIAGNOSIS — I10 Essential (primary) hypertension: Secondary | ICD-10-CM

## 2015-04-16 MED ORDER — LOSARTAN POTASSIUM 50 MG PO TABS: 50.0000 mg | ORAL_TABLET | Freq: Every day | ORAL | Status: DC

## 2015-04-16 NOTE — Progress Notes (Signed)
OFFICE NOTE  Chief Complaint:  Stable shortness of breath  Primary Care Physician: [name], MD  HPI:  Kimberly [name] is a 74 year old female who recently has been having some chest discomfort and shortness of breath as well as 2 episodes of syncope, both of which were during stressful situations, once while on a ladder. We went ahead and checked an echocardiogram which essentially shows only mild abnormalities. She did have a loudly calcified aortic valve which was not stenotic; however, there was mild regurgitation. There was mild LVH with EF 55% to 60% and grade 1 diastolic dysfunction. She had borderline pulmonary hypertension. A monitor was also worn by her from September 13, 2012 to October 12, 2012. This showed a predominantly sinus rhythm with PACs. There was, however, 1 episode of a nonsustained ventricular tachycardia which was about 5 beats. Of course she potentially would have to had longer episodes of NSVT this could have been playing a role in her syncopal event. I do not think she, however, is at high risk for lethal sustained VT given her normal ejection fraction and no clear evidence of ischemia. As you recall, she had a low-risk MET-test on April 02, 2012, with a peak VO2 of 77% predicted without any evidence of an ischemic response. This would make it quite unlikely that she has underlying coronary blockage that is responsible for her episode. I have, however, recommended adding a beta blocker to her regimen which will additionally help control her recent uncontrolled hypertension. This is Toprol XL 12.5 mg daily. It should help also with the VT. she is continuing to take low-dose Toprol and has had no further episodes of passing out in the past 6 months. She was thoroughly evaluated by a neurologist and felt to possibly be having a vasodepressive syncope. There was lower extremity.swelling and compression stockings were recommended.  Kimberly [name] returns today for  followup. She has been hospitalized a number of times since getting a diagnosis of lung cancer. 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 Unfortunately she developed A. fib with RVR and was treated and placed on amiodarone. She's been having problems with recurrent vasovagal syncope and hypotension. She does have a mass that is pressing against the atrium.  I saw Kimberly [name] back in the office today. She is undergoing chemotherapy which is cause some mild shrinkage of her tumor however it is not disappeared, and apparently is incurable and unresectable. Fortunate she's had no recurrence of her A. fib and remains on low-dose amiodarone. She says that she feels better than she had when she was undergoing radiation chemotherapy. She has no new complaints.  Kimberly [name] returns today for follow-up. She seems to be holding her own with regards for lung cancer. She has another 4 month reprieve to see her oncologist. Her most recent scan showed a slight increase in the size of her cancer with another small tumor but she seems to be taking this pretty well. She's not had recurrent A. fib from what I can tell and maintains on low-dose amiodarone. She is due for repeat lipid testing as well as surveillance thyroid testing.  PMHx:  Past Medical History  Diagnosis Date  . Hypertension   . Thyroid disease   . GERD (gastroesophageal reflux disease)   . Dyslipidemia   . Aortic insufficiency   . Mitral insufficiency   . History of nuclear stress test 02/26/2006    exercise myoview; normal pattern of perfusion; low  risk scan   . PONV (postoperative nausea and vomiting)   . Heart murmur   . Hypothyroidism   . Cough     dry, endobronchial mass  . Pneumonia     pus bronchitis  . Arthritis   . Syncope     "states she has passed out a few times. dr is trying to find cause.last time when geeting ready to go home after video bronch/bx  . Shortness of breath     with exertion    . Anxiety     due to surgery   . Bronchitis   . Radiation 12/01/13-01/08/14    50.4 gray to right central chest  . Atrial fibrillation   . Squamous cell carcinoma of lung     Past Surgical History  Procedure Laterality Date  . Thyroidectomy  1973  . Dilation and curettage of uterus    . Carpal tunnel release Right 1988  . Rotator cuff repair  2006    ? side  . H/o met test w/pft  04/02/2012    low risk; peak VO2 77% predicted  . Cardiac catheterization  07/08/2008    normal coronaries  . Transthoracic echocardiogram  09/25/2012    EF 55-60%; mild LVH & mild concentric hypertrophy; mild AV regurg; RV systolic pressure increase consistent with mild pulm HTN  . Video bronchoscopy Bilateral 09/25/2013    Procedure: VIDEO BRONCHOSCOPY WITHOUT FLUORO;  Surgeon: Tanda Rockers, MD;  Location: [facility];  Service: Cardiopulmonary;  Laterality: Bilateral;  . Joint replacement  2003    thumb rt  . Knee arthroscopy  12    rt meniscus  . Video bronchoscopy with endobronchial ultrasound N/A 10/30/2013    Procedure: VIDEO BRONCHOSCOPY WITH ENDOBRONCHIAL ULTRASOUND;  Surgeon: Melrose Nakayama, MD;  Location: [place];  Service: Thoracic;  Laterality: N/A;  . Mediastinoscopy N/A 10/30/2013    Procedure: MEDIASTINOSCOPY;  Surgeon: Melrose Nakayama, MD;  Location: [place];  Service: Thoracic;  Laterality: N/A;  . Eye surgery Bilateral   . Video assisted thoracoscopy (vats)/ lobectomy Right 11/13/2013    Procedure: VIDEO ASSISTED THORACOSCOPY (VATS) with mediastinal  biopsies;  Surgeon: Melrose Nakayama, MD;  Location: [place];  Service: Thoracic;  Laterality: Right;  RIGHT VATS,mediastinal biopsies    FAMHx:  Family History  Problem Relation Age of Onset  . Lung cancer Mother   . Heart attack Father   . Heart failure Maternal Grandfather   . Heart attack Paternal Grandfather     SOCHx:   reports that she quit smoking about 47 years ago. Her smoking use included Cigarettes. She has a 3  pack-year smoking history. She has never used smokeless tobacco. She reports that she drinks alcohol. She reports that she does not use illicit drugs.  ALLERGIES:  Allergies  Allergen Reactions  . Milk-Related Compounds Other (See Comments)    REACTION: GI upset, projectile vomiting    ROS: A comprehensive review of systems was negative except for: Respiratory: positive for dyspnea on exertion  HOME MEDS: Current Outpatient Prescriptions  Medication Sig Dispense Refill  . aspirin EC 81 MG tablet Take 81 mg by mouth daily.    . Multiple Vitamins-Iron (MULTIVITAMIN/IRON PO) Take by mouth daily.    [name] albuterol (PROVENTIL HFA;VENTOLIN HFA) 108 (90 BASE) MCG/ACT inhaler Inhale 1 puff into the lungs every 6 (six) hours as needed for wheezing or shortness of breath.    [name] amiodarone (PACERONE) 200 MG tablet Take 1 tablet by mouth  daily 90  tablet 2  . atorvastatin (LIPITOR) 20 MG tablet Take 20 mg by mouth every morning.     [name] EPINEPHrine 0.3 mg/0.3 mL IJ SOAJ injection Inject 0.3 mg into the muscle once as needed (reaction to milk products).    [name] levothyroxine (SYNTHROID, LEVOTHROID) 125 MCG tablet Take 125 mcg by mouth daily before breakfast.    . losartan (COZAAR) 50 MG tablet Take 1 tablet (50 mg total) by mouth daily. 90 tablet 3  . metoprolol succinate (TOPROL-XL) 25 MG 24 hr tablet Take 0.5 tablets (12.5 mg total) by mouth daily. 45 tablet 3  . montelukast (SINGULAIR) 10 MG tablet Take 10 mg by mouth daily.     No current facility-administered medications for this visit.    LABS/IMAGING: No results found for this or any previous visit (from the past 48 hour(s)). No results found.  VITALS: BP 198/86 mmHg  Pulse 57  Ht _0  (1.499 m)  Wt 155 lb 3.2 oz (70.398 kg)  BMI 31.33 kg/m2  EXAM: GEN: Awake, nad HEENT: PERRLA, EOMI LUNGS: Clear bilaterally CV: RRR, s1/s2, no MRG's ABD: S/NT, +BS EXT: No edema NEURO: A&O x 3, grossly non-focal SKIN: DRY, warm, no rashes PSYCH:  Mood, affect normal  EKG: Sinus bradycardia 57  ASSESSMENT: 1. Lung cancer (non-small cell stage III) 2. Recurrent vasodepressor syncope  3. Lower extremity edema 4. Hypertension - blood pressure now elevated 5. Paroxysmal atrial fibrillation on amiodarone  PLAN: 1.   Kimberly [name] seems to be doing fairly well although her lung cancer may be progressing. She's not had recurrent A. fib on amiodarone. Blood pressure is recovering for some reason and is now elevated. We had cut back her losartan previously in fact stopped her blood pressure medicines because of low blood pressure and at her last office visit I restarted it. She will need more blood pressure medication I like to increase her losartan to 50 mg daily today. Plan to have her seen back in about 2 weeks with Erasmo Downer and she may further up titrate her blood pressure medications.  Pixie Casino, MD, [facility] Attending Cardiologist [place] 04/16/2015, 1:01 PM

## 2015-04-16 NOTE — Patient Instructions (Signed)
Your physician has recommended you make the following change in your medication: INCREASE losartan to '50mg'$  daily  Your physician recommends that you return for lab work FASTING  Your physician recommends that you schedule a follow-up appointment in 2 weeks with Kimberly [name] for a blood pressure check  >> please bring your BP monitor and any readings if you have them   Your physician wants you to follow-up in: 6 months with Dr. Debara Pickett. You will receive a reminder letter in the mail two months in advance. If you don't receive a letter, please call our office to schedule the follow-up appointment.

## 2015-04-20 DIAGNOSIS — Z79899 Other long term (current) drug therapy: Secondary | ICD-10-CM | POA: Diagnosis not present

## 2015-04-20 DIAGNOSIS — E039 Hypothyroidism, unspecified: Secondary | ICD-10-CM | POA: Diagnosis not present

## 2015-04-20 DIAGNOSIS — E785 Hyperlipidemia, unspecified: Secondary | ICD-10-CM | POA: Diagnosis not present

## 2015-04-21 LAB — LIPID PANEL
Cholesterol: 134 mg/dL (ref 125–200)
HDL: 60 mg/dL (ref >=46)
LDL CALC: 60 mg/dL (ref <130)
Total CHOL/HDL Ratio: 2.2 Ratio (ref <=5.0)
Triglycerides: 71 mg/dL (ref <150)
VLDL: 14 mg/dL (ref <30)

## 2015-04-21 LAB — TSH: TSH: 0.555 u[IU]/mL (ref 0.350–4.500)

## 2015-04-29 ENCOUNTER — Ambulatory Visit (INDEPENDENT_AMBULATORY_CARE_PROVIDER_SITE_OTHER): Payer: Medicare Other | Admitting: Pharmacist Clinician (PhC)/ Clinical Pharmacy Specialist

## 2015-04-29 ENCOUNTER — Encounter: Payer: Self-pay | Admitting: Pharmacist Clinician (PhC)/ Clinical Pharmacy Specialist

## 2015-04-29 VITALS — BP 192/80 | HR 68 | Ht 59.0 in | Wt 155.4 lb

## 2015-04-29 DIAGNOSIS — I10 Essential (primary) hypertension: Secondary | ICD-10-CM | POA: Diagnosis not present

## 2015-04-29 NOTE — Progress Notes (Signed)
04/29/2015 HARLOWE DOWLER [birth year] [id number]   HPI:  Kimberly [name] is a 74 y.o. female patient of Dr Debara Pickett, with a PMH below who presents today for hypertension clinic evaluation.  She saw Dr. Debara Pickett about 2 weeks ago and her losartan was increased from 25 to 50 mg daily.  She reports no problems with this increase in dose.  She had previously been on losartan, then had it discontinued due to hypotension, and restarted this past summer.  She is also being followed by the [facility] for large cell lung cancer.  She is not currently taking chemo or radiation and will follow up with them next in November.  Cardiac Hx: mild LVH,    Social Hx: no tobacco, occasional alcohol.  Drinks 1-2 cups of coffee/day with the occasional caffeinated soda  Diet: eats mostly at home, does add some salt when cooking, but not at the table  Exercise: previous walker, has not done so recently because of heat and SOB if walking uphill  Home BP readings: 6 readings since seeing Dr. Debara Pickett; highest 150/72, all others WNL, average 138/71.  She brings her meter into the office today for validation, an older [employer] bodel  Current antihypertensive medications: losartan 50 mg   Current Outpatient Prescriptions  Medication Sig Dispense Refill  . albuterol (PROVENTIL HFA;VENTOLIN HFA) 108 (90 BASE) MCG/ACT inhaler Inhale 1 puff into the lungs every 6 (six) hours as needed for wheezing or shortness of breath.    [name] amiodarone (PACERONE) 200 MG tablet Take 1 tablet by mouth  daily 90 tablet 2  . aspirin EC 81 MG tablet Take 81 mg by mouth daily.    [name] atorvastatin (LIPITOR) 20 MG tablet Take 20 mg by mouth every morning.     [name] EPINEPHrine 0.3 mg/0.3 mL IJ SOAJ injection Inject 0.3 mg into the muscle once as needed (reaction to milk products).    [name] levothyroxine (SYNTHROID, LEVOTHROID) 125 MCG tablet Take 125 mcg by mouth daily before breakfast.    . losartan (COZAAR) 50 MG tablet Take 1 tablet (50 mg total)  by mouth daily. 90 tablet 3  . metoprolol succinate (TOPROL-XL) 25 MG 24 hr tablet Take 0.5 tablets (12.5 mg total) by mouth daily. 45 tablet 3  . montelukast (SINGULAIR) 10 MG tablet Take 10 mg by mouth daily.    . Multiple Vitamins-Iron (MULTIVITAMIN/IRON PO) Take by mouth daily.     No current facility-administered medications for this visit.    Allergies  Allergen Reactions  . Milk-Related Compounds Other (See Comments)    REACTION: GI upset, projectile vomiting    Past Medical History  Diagnosis Date  . Hypertension   . Thyroid disease   . GERD (gastroesophageal reflux disease)   . Dyslipidemia   . Aortic insufficiency   . Mitral insufficiency   . History of nuclear stress test 02/26/2006    exercise myoview; normal pattern of perfusion; low risk scan   . PONV (postoperative nausea and vomiting)   . Heart murmur   . Hypothyroidism   . Cough     dry, endobronchial mass  . Pneumonia     pus bronchitis  . Arthritis   . Syncope     "states she has passed out a few times. dr is trying to find cause.last time when geeting ready to go home after video bronch/bx  . Shortness of breath     with exertion  . Anxiety     due to surgery   .  Bronchitis   . Radiation 12/01/13-01/08/14    50.4 gray to right central chest  . Atrial fibrillation   . Squamous cell carcinoma of lung     Blood pressure 192/80, pulse 68, height '4\' 11"'$  (1.499 m), weight 155 lb 6.4 oz (70.489 kg).    Tommy Medal PharmD CPP [facility]

## 2015-04-29 NOTE — Patient Instructions (Signed)
Your blood pressure today is 192/80 (goal is < 150/90).  Because your meter read very similar, I think this is probably "white coat" hypertension  Check your blood pressure at home and keep record of the readings.  Take your BP meds as follows: continue with current medications  Bring all of your meds, your BP cuff and your record of home blood pressures to your next appointment.  Exercise as you're able, try to walk approximately 30 minutes per day.  Keep salt intake to a minimum, especially watch canned and prepared boxed foods.  Eat more fresh fruits and vegetables and fewer canned items.  Avoid eating in fast food restaurants.    HOW TO TAKE YOUR BLOOD PRESSURE: . Rest 5 minutes before taking your blood pressure. .  Don't smoke or drink caffeinated beverages for at least 30 minutes before. . Take your blood pressure before (not after) you eat. . Sit comfortably with your back supported and both feet on the floor (don't cross your legs). . Elevate your arm to heart level on a table or a desk. . Use the proper sized cuff. It should fit smoothly and snugly around your bare upper arm. There should be enough room to slip a fingertip under the cuff. The bottom edge of the cuff should be 1 inch above the crease of the elbow. . Ideally, take 3 measurements at one sitting and record the average.

## 2015-04-30 NOTE — Assessment & Plan Note (Signed)
She has been on the losartan 50 mg for about 2 weeks now and her home BP readings are well WNL.  Unfortunately in the office it was quite elevated at 192/80.  Her home cuff was tested, and it read 184/81.  Because this was within 10 points, I am going to assume that her home readings are accurate.  I suspect there is an element of white coat hypertension today.  She is to continue with daily home monitoring of her BP and is to call if she sees it trending to > 150/90.  I don't want to increase her medication today and cause her to feel hypotensive.

## 2015-05-20 DIAGNOSIS — M810 Age-related osteoporosis without current pathological fracture: Secondary | ICD-10-CM | POA: Diagnosis not present

## 2015-07-01 ENCOUNTER — Ambulatory Visit: Payer: Medicare Other | Admitting: Internal Medicine

## 2015-07-02 ENCOUNTER — Ambulatory Visit (HOSPITAL_COMMUNITY)
Admission: RE | Admit: 2015-07-02 | Discharge: 2015-07-02 | Disposition: A | Discharge status: Home / Self Care | Payer: Medicare Other | Source: Ambulatory Visit | Attending: Internal Medicine | Admitting: Internal Medicine

## 2015-07-02 ENCOUNTER — Other Ambulatory Visit (HOSPITAL_BASED_OUTPATIENT_CLINIC_OR_DEPARTMENT_OTHER): Payer: Medicare Other

## 2015-07-02 DIAGNOSIS — Z08 Encounter for follow-up examination after completed treatment for malignant neoplasm: Secondary | ICD-10-CM | POA: Insufficient documentation

## 2015-07-02 DIAGNOSIS — C3490 Malignant neoplasm of unspecified part of unspecified bronchus or lung: Secondary | ICD-10-CM | POA: Diagnosis not present

## 2015-07-02 DIAGNOSIS — Z923 Personal history of irradiation: Secondary | ICD-10-CM | POA: Diagnosis not present

## 2015-07-02 DIAGNOSIS — C342 Malignant neoplasm of middle lobe, bronchus or lung: Secondary | ICD-10-CM | POA: Insufficient documentation

## 2015-07-02 DIAGNOSIS — R918 Other nonspecific abnormal finding of lung field: Secondary | ICD-10-CM | POA: Diagnosis not present

## 2015-07-02 DIAGNOSIS — I7090 Unspecified atherosclerosis: Secondary | ICD-10-CM | POA: Diagnosis not present

## 2015-07-02 LAB — CBC WITH DIFFERENTIAL/PLATELET
BASO%: 1.2 % (ref 0.0–2.0)
Basophils Absolute: 0.1 10*3/uL (ref 0.0–0.1)
EOS%: 6.4 % (ref 0.0–7.0)
Eosinophils Absolute: 0.4 10*3/uL (ref 0.0–0.5)
HCT: 34.7 % — ABNORMAL LOW (ref 34.8–46.6)
HGB: 11.5 g/dL — ABNORMAL LOW (ref 11.6–15.9)
LYMPH%: 19.9 % (ref 14.0–49.7)
MCH: 32.3 pg (ref 25.1–34.0)
MCHC: 33.3 g/dL (ref 31.5–36.0)
MCV: 97 fL (ref 79.5–101.0)
MONO#: 0.5 10*3/uL (ref 0.1–0.9)
MONO%: 7.6 % (ref 0.0–14.0)
NEUT%: 64.9 % (ref 38.4–76.8)
NEUTROS ABS: 4 10*3/uL (ref 1.5–6.5)
PLATELETS: 284 10*3/uL (ref 145–400)
RBC: 3.58 10*6/uL — AB (ref 3.70–5.45)
RDW: 14 % (ref 11.2–14.5)
WBC: 6.1 10*3/uL (ref 3.9–10.3)
lymph#: 1.2 10*3/uL (ref 0.9–3.3)

## 2015-07-02 LAB — COMPREHENSIVE METABOLIC PANEL (CC13)
ALBUMIN: 3.7 g/dL (ref 3.5–5.0)
ALT: 29 U/L (ref 0–55)
AST: 25 U/L (ref 5–34)
Alkaline Phosphatase: 119 U/L (ref 40–150)
Anion Gap: 6 mEq/L (ref 3–11)
BILIRUBIN TOTAL: 0.31 mg/dL (ref 0.20–1.20)
BUN: 19 mg/dL (ref 7.0–26.0)
CO2: 29 mEq/L (ref 22–29)
Calcium: 9.9 mg/dL (ref 8.4–10.4)
Chloride: 106 mEq/L (ref 98–109)
Creatinine: 1.1 mg/dL (ref 0.6–1.1)
EGFR: 48 mL/min/{1.73_m2} — AB (ref >90)
GLUCOSE: 89 mg/dL (ref 70–140)
Potassium: 5.3 mEq/L — ABNORMAL HIGH (ref 3.5–5.1)
Sodium: 140 mEq/L (ref 136–145)
Total Protein: 6.8 g/dL (ref 6.4–8.3)

## 2015-07-02 MED ORDER — IOHEXOL 300 MG/ML  SOLN
75.0000 mL | Freq: Once | INTRAMUSCULAR | Status: AC | PRN
  Administered 2015-07-02: 75 mL via INTRAVENOUS

## 2015-07-03 ENCOUNTER — Other Ambulatory Visit: Payer: Self-pay | Admitting: Internal Medicine

## 2015-07-05 DIAGNOSIS — M810 Age-related osteoporosis without current pathological fracture: Secondary | ICD-10-CM | POA: Diagnosis not present

## 2015-07-05 DIAGNOSIS — Z23 Encounter for immunization: Secondary | ICD-10-CM | POA: Diagnosis not present

## 2015-07-05 DIAGNOSIS — R110 Nausea: Secondary | ICD-10-CM | POA: Diagnosis not present

## 2015-07-08 ENCOUNTER — Telehealth: Payer: Self-pay | Admitting: Internal Medicine

## 2015-07-08 ENCOUNTER — Ambulatory Visit (HOSPITAL_BASED_OUTPATIENT_CLINIC_OR_DEPARTMENT_OTHER): Payer: Medicare Other | Admitting: Internal Medicine

## 2015-07-08 ENCOUNTER — Encounter: Payer: Self-pay | Admitting: Internal Medicine

## 2015-07-08 VITALS — BP 196/64 | HR 62 | Temp 98.3°F | Resp 18 | Ht 59.0 in | Wt 160.3 lb

## 2015-07-08 DIAGNOSIS — C342 Malignant neoplasm of middle lobe, bronchus or lung: Secondary | ICD-10-CM

## 2015-07-08 NOTE — Progress Notes (Signed)
    [facility] [phone]   [phone]  OFFICE NOTE  PHARR,WALTER DAVIDSON, MD [address] [place] [address]  DIAGNOSIS: Unresectable, likely a stage IIIA (T2a, N2, M0) non-small cell lung cancer, squamous cell carcinoma diagnosed in February 2015.  PRIOR THERAPY:  1) On 11/13/2013 the patient underwent exploratory right VATS with mediastinal biopsy under the care of Dr. Hendrickson. 2) Concurrent chemoradiation with weekly carboplatin for AUC of 2 and paclitaxel 45 mg/M2, status post 6 cycles with partial response. First cycle was given on 12/01/2013. 3) Consolidation chemotherapy with carboplatin for AUC of 5 and paclitaxel 175 mg/M2 every 3 weeks with Neulasta support. First dose 02/23/2014. Status post 3 cycles with partial response.   CURRENT THERAPY: Observation.  INTERVAL HISTORY: Kimberly [name] 74 y.o. female returns to the clinic today for followup visit. The patient is feeling much better today with no significant complaints except for mild dry cough increased with allergy or perfumes. She also has occasional nausea after eating certain foods mainly dairy products. She denied having any significant no fever or chills. She denied having any chest pain, shortness of breath, cough or hemoptysis. No significant weight loss or night sweats. She had repeat CT scan of the chest performed recently and she is here for evaluation and discussion of her scan results.  MEDICAL HISTORY: Past Medical History  Diagnosis Date  . Hypertension   . Thyroid disease   . GERD (gastroesophageal reflux disease)   . Dyslipidemia   . Aortic insufficiency   . Mitral insufficiency   . History of nuclear stress test 02/26/2006    exercise myoview; normal pattern of perfusion; low risk scan   . PONV (postoperative nausea and vomiting)   . Heart murmur   . Hypothyroidism   . Cough     dry, endobronchial mass  . Pneumonia     pus  bronchitis  . Arthritis   . Syncope     "states she has passed out a few times. dr is trying to find cause.last time when geeting ready to go home after video bronch/bx  . Shortness of breath     with exertion  . Anxiety     due to surgery   . Bronchitis   . Radiation 12/01/13-01/08/14    50.4 gray to right central chest  . Atrial fibrillation (HCC)   . Squamous cell carcinoma of lung (HCC)     ALLERGIES:  is allergic to milk-related compounds.  MEDICATIONS:  Current Outpatient Prescriptions  Medication Sig Dispense Refill  . amiodarone (PACERONE) 200 MG tablet Take 1 tablet by mouth  daily 90 tablet 1  . aspirin EC 81 MG tablet Take 81 mg by mouth daily.    . atorvastatin (LIPITOR) 20 MG tablet Take 20 mg by mouth every morning.     . levothyroxine (SYNTHROID, LEVOTHROID) 125 MCG tablet Take 125 mcg by mouth daily before breakfast.    . losartan (COZAAR) 50 MG tablet Take 1 tablet (50 mg total) by mouth daily. 90 tablet 3  . metoprolol succinate (TOPROL-XL) 25 MG 24 hr tablet Take 0.5 tablets (12.5 mg total) by mouth daily. 45 tablet 3  . montelukast (SINGULAIR) 10 MG tablet Take 10 mg by mouth daily.    . Multiple Vitamins-Iron (MULTIVITAMIN/IRON PO) Take by mouth daily.    . albuterol (PROVENTIL HFA;VENTOLIN HFA) 108 (90 BASE) MCG/ACT inhaler Inhale 1 puff into the lungs every 6 (six) hours as needed for wheezing   or shortness of breath.     No current facility-administered medications for this visit.    SURGICAL HISTORY:  Past Surgical History  Procedure Laterality Date  . Thyroidectomy  1973  . Dilation and curettage of uterus    . Carpal tunnel release Right 1988  . Rotator cuff repair  2006    ? side  . H/o met test w/pft  04/02/2012    low risk; peak VO2 77% predicted  . Cardiac catheterization  07/08/2008    normal coronaries  . Transthoracic echocardiogram  09/25/2012    EF 55-60%; mild LVH & mild concentric hypertrophy; mild AV regurg; RV systolic pressure increase  consistent with mild pulm HTN  . Video bronchoscopy Bilateral 09/25/2013    Procedure: VIDEO BRONCHOSCOPY WITHOUT FLUORO;  Surgeon: Michael B Wert, MD;  Location: [facility];  Service: Cardiopulmonary;  Laterality: Bilateral;  . Joint replacement  2003    thumb rt  . Knee arthroscopy  12    rt meniscus  . Video bronchoscopy with endobronchial ultrasound N/A 10/30/2013    Procedure: VIDEO BRONCHOSCOPY WITH ENDOBRONCHIAL ULTRASOUND;  Surgeon: Steven C Hendrickson, MD;  Location: [facility] OR;  Service: Thoracic;  Laterality: N/A;  . Mediastinoscopy N/A 10/30/2013    Procedure: MEDIASTINOSCOPY;  Surgeon: Steven C Hendrickson, MD;  Location: [facility] OR;  Service: Thoracic;  Laterality: N/A;  . Eye surgery Bilateral   . Video assisted thoracoscopy (vats)/ lobectomy Right 11/13/2013    Procedure: VIDEO ASSISTED THORACOSCOPY (VATS) with mediastinal  biopsies;  Surgeon: Steven C Hendrickson, MD;  Location: [facility] OR;  Service: Thoracic;  Laterality: Right;  RIGHT VATS,mediastinal biopsies    REVIEW OF SYSTEMS:  A comprehensive review of systems was negative except for: Respiratory: positive for cough Gastrointestinal: positive for nausea   PHYSICAL EXAMINATION: General appearance: alert, cooperative, fatigued and no distress Head: Normocephalic, without obvious abnormality, atraumatic Neck: no adenopathy, no JVD, supple, symmetrical, trachea midline and thyroid not enlarged, symmetric, no tenderness/mass/nodules Lymph nodes: Cervical, supraclavicular, and axillary nodes normal. Resp: clear to auscultation bilaterally Back: symmetric, no curvature. ROM normal. No CVA tenderness. Cardio: regular rate and rhythm, S1, S2 normal, no murmur, click, rub or gallop GI: soft, non-tender; bowel sounds normal; no masses,  no organomegaly Extremities: extremities normal, atraumatic, no cyanosis or edema Neurologic: Alert and oriented X 3, normal strength and tone. Normal symmetric reflexes. Normal coordination and gait  ECOG  PERFORMANCE STATUS: 1 - Symptomatic but completely ambulatory  Blood pressure 196/64, pulse 62, temperature 98.3 F (36.8 C), temperature source Oral, resp. rate 18, height 4' 11" (1.499 m), weight 160 lb 4.8 oz (72.712 kg), SpO2 99 %.  LABORATORY DATA: Lab Results  Component Value Date   WBC 6.1 07/02/2015   HGB 11.5* 07/02/2015   HCT 34.7* 07/02/2015   MCV 97.0 07/02/2015   PLT 284 07/02/2015      Chemistry      Component Value Date/Time   NA 140 07/02/2015 1005   NA 131* 04/13/2014 0814   K 5.3* 07/02/2015 1005   K 4.5 04/13/2014 0814   CL 96 04/13/2014 0814   CO2 29 07/02/2015 1005   CO2 24 04/13/2014 0814   BUN 19.0 07/02/2015 1005   BUN 16 04/13/2014 0814   CREATININE 1.1 07/02/2015 1005   CREATININE 1.01 04/13/2014 0814      Component Value Date/Time   CALCIUM 9.9 07/02/2015 1005   CALCIUM 9.3 04/13/2014 0814   ALKPHOS 119 07/02/2015 1005   ALKPHOS 82 04/13/2014 0814   AST   25 07/02/2015 1005   AST 17 04/13/2014 0814   ALT 29 07/02/2015 1005   ALT 27 04/13/2014 0814   BILITOT 0.31 07/02/2015 1005   BILITOT 0.6 04/13/2014 0814       RADIOGRAPHIC STUDIES: Ct Chest W Contrast  07/02/2015  CLINICAL DATA:  Unresectable stage IIIA non-small cell lung cancer (squamous cell carcinoma) diagnosed in February 2015. Restaging post chemotherapy and radiation therapy. EXAM: CT CHEST WITH CONTRAST TECHNIQUE: Multidetector CT imaging of the chest was performed during intravenous contrast administration. CONTRAST:  61m OMNIPAQUE IOHEXOL 300 MG/ML  SOLN COMPARISON:  Chest CTs dated 02/17/2015 and 10/27/2014. FINDINGS: Mediastinum/Nodes: There is stable volume loss in the right hemithorax with stable soft tissue fullness over on the right hilum and subcarinal region. No discretely enlarged mediastinal, hilar or axillary lymph nodes are demonstrated.There is stable mild esophageal wall thickening. Patient is status post thyroid active The heart size is normal. There is no  pericardial effusion. Mild atherosclerosis of the aorta, great vessels and coronary arteries again noted. Lungs/Pleura: There is no pleural effusion. A small amount of pleural thickening on the right appears unchanged. There are stable radiation changes medially in the right lung with air bronchograms and fibrotic changes. There are mildly increased inflammatory changes in the right lower lobe. No endobronchial lesion or discrete nodule demonstrated. The left lung appears unchanged with small calcified granulomas. Upper abdomen: The visualized upper abdomen appears stable. There is tenting of the right hemidiaphragm attributed to radiation changes in the right lung. Musculoskeletal/Chest wall: There is no chest wall mass or suspicious osseous finding. Mild thoracic spine degenerative changes are stable. IMPRESSION: 1. Stable appearance of the chest with volume loss and paramediastinal radiation changes in the right hemithorax. No evidence of local recurrence or metastatic disease. 2. Minimal increased patchy opacity in the right lower lobe, likely inflammatory. 3. Mild atherosclerosis Electronically Signed   By: WRichardean SaleM.D.   On: 07/02/2015 12:44   ASSESSMENT AND PLAN:  1) unresectable stage IIIA non-small cell lung cancer:  This is a very pleasant 74years old white female with unresectable stage IIIA non-small cell lung cancer completed a course of concurrent chemoradiation with weekly carboplatin and paclitaxel status post 6 cycles.  This is followed by 3 cycles of consolidation chemotherapy with reduced dose carboplatin and paclitaxel. The recent CT scan of the chest showed no evidence for disease progression.  I discussed the scan results with the patient. I recommended for her to continue on observation with repeat CT scan of the chest in 6 months with restaging of her disease.                                                                                                     She was advised  to call immediately if she has any concerning symptoms in the interval. The patient voices understanding of current disease status and treatment options and is in agreement with the current care plan.  All questions were answered. The patient knows to call the clinic with any problems, questions or concerns. We can certainly see  the patient much sooner if necessary.  Disclaimer: This note was dictated with voice recognition software. Similar sounding words can inadvertently be transcribed and may not be corrected upon review.

## 2015-07-08 NOTE — Telephone Encounter (Signed)
Gave and printed appt sched and avs fo rpt for May 2017 °

## 2015-07-21 ENCOUNTER — Other Ambulatory Visit: Payer: Self-pay | Admitting: Internal Medicine

## 2015-07-21 NOTE — Telephone Encounter (Signed)
Rx request sent to pharmacy.  

## 2015-08-11 DIAGNOSIS — H3400 Transient retinal artery occlusion, unspecified eye: Secondary | ICD-10-CM | POA: Diagnosis not present

## 2015-08-11 DIAGNOSIS — H524 Presbyopia: Secondary | ICD-10-CM | POA: Diagnosis not present

## 2015-08-13 ENCOUNTER — Encounter: Payer: Self-pay | Admitting: Internal Medicine

## 2015-08-13 NOTE — Progress Notes (Signed)
Received communication from the patient's optometrist, Dr. Nicki Reaper, that he is seeing significant corneal deposits of amiodarone. This may be affecting her vision. He is recommending that I discontinue amiodarone. As she had not had recurrence of a-fib and it was initially thought to be due to her peri-atrial mass, this is reasonable with low likelihood of recurrence.   Kimberly Casino, MD, [facility] Attending Cardiologist [place]

## 2015-08-16 ENCOUNTER — Telehealth: Payer: Self-pay | Admitting: Internal Medicine

## 2015-08-16 NOTE — Telephone Encounter (Signed)
Please see if she can get in to see Erasmo Downer. I'm in an RIE this week.  Dr. Lemmie Evens

## 2015-08-16 NOTE — Addendum Note (Signed)
Addended by: Cristopher Estimable on: 08/16/2015 05:03 PM   Modules accepted: Orders, Medications

## 2015-08-16 NOTE — Progress Notes (Signed)
Patient voiced understanding to stop the amiodarone.

## 2015-08-16 NOTE — Telephone Encounter (Signed)
Pt reports BPs running high - she had not consistently been checking them at home, but after a visit to eye doctor where her BP was noted to be elevated (110 systolic), she checked for past few days at home.  Notes "ancy feeling", sometimes feels "funny" when standing but denies lightheadedness or dizziness.  She reports BPs from 12/7 to today:  12/7 198/85  155/76 12/8 147/68 12/9 156/76 12/10 169/77 12/11 162/74  156/73 12/12 156/84  150/104  She reports HR between 60-82 for her readings  Meds are as reported. Pt aware I will defer to Dr. Debara Pickett for advice.

## 2015-08-16 NOTE — Telephone Encounter (Signed)
Please call,having problem with blood pressure being high,

## 2015-08-18 MED ORDER — LOSARTAN POTASSIUM 100 MG PO TABS: 100.0000 mg | ORAL_TABLET | Freq: Every day | ORAL | Status: DC

## 2015-08-18 NOTE — Telephone Encounter (Signed)
Have her increase losartan to 100 mg qd and schedule her to see me in 2 weeks.

## 2015-08-18 NOTE — Telephone Encounter (Signed)
Set appt for BP visit and dose adjustment, she voiced understanding of recommendation on dose increase and to continue her BP checks at home. Advised to call if any new concerns or needs. Pt voiced thanks for the call.

## 2015-09-02 ENCOUNTER — Encounter: Payer: Self-pay | Admitting: Pharmacist Clinician (PhC)/ Clinical Pharmacy Specialist

## 2015-09-02 ENCOUNTER — Ambulatory Visit (INDEPENDENT_AMBULATORY_CARE_PROVIDER_SITE_OTHER): Payer: Medicare Other | Admitting: Pharmacist Clinician (PhC)/ Clinical Pharmacy Specialist

## 2015-09-02 VITALS — BP 176/92 | HR 68 | Ht 59.0 in | Wt 162.1 lb

## 2015-09-02 DIAGNOSIS — I10 Essential (primary) hypertension: Secondary | ICD-10-CM

## 2015-09-02 MED ORDER — CHLORTHALIDONE 25 MG PO TABS: 12.5000 mg | ORAL_TABLET | Freq: Every day | ORAL | Status: DC

## 2015-09-02 NOTE — Assessment & Plan Note (Addendum)
With her pressure still elevated at 176/92 today I am going to start her on chlorthalidone.  When I saw her in August we confirmed that her home BP Cuff was accurate to within 10 points.  Because half of her current month home readings are <150/90, I will start the chlorthalidone at 12.5 mg and see her back next month.  At that time I will have her check a BMET.  She is also to work on getting her diet back on track now that the holidays are over.

## 2015-09-02 NOTE — Progress Notes (Signed)
09/02/2015 [name] SHENG PRITZ 1941-02-06 [id number]   HPI:  VANNIA POLA is a 74 y.o. female patient of Dr Debara Pickett, with a PMH below who presents today for hypertension clinic follow up.  I saw her last in August, and while her reading in the office was elevated, her home readings had all been WNL, with the highest at 150/72.  She called the office on Dec 12 to report increasing BP readings and we increased her losartan to 100 mg per day.  She returns today for follow up.  She complains today of feeling more "jittery" and notes that she has started having hot flashes recently, even though it's been 20 years since she went thru menopause.  She reports some difficulty getting a deep breath, but no tightness or chest pressure.   Cardiac Hx: mild LVH,    Social Hx: no tobacco, occasional alcohol.  Drinks 1-2 cups of coffee/day with the occasional caffeinated soda  Diet: eats mostly at home, does add some salt when cooking, but not at the table;admits to worse eating habits in past 2-3 weeks because of the holidays, more eating out, grabbing quick unhealthy snacks  Exercise: none, other than occasional yard work and some walking  Home BP readings: 150-198/68-104 per home record, has been erratic for past several weeks, even after increasing losartan  Current antihypertensive medications: losartan 100 mg, metoprolol succ 25 mg qd   Current Outpatient Prescriptions  Medication Sig Dispense Refill  . albuterol (PROVENTIL HFA;VENTOLIN HFA) 108 (90 BASE) MCG/ACT inhaler Inhale 1 puff into the lungs every 6 (six) hours as needed for wheezing or shortness of breath.    [name] aspirin EC 81 MG tablet Take 81 mg by mouth daily.    [name] atorvastatin (LIPITOR) 20 MG tablet Take 20 mg by mouth every morning.     . calcium-vitamin D 250-100 MG-UNIT tablet Take 1 tablet by mouth daily.    . chlorthalidone (HYGROTON) 25 MG tablet Take 0.5 tablets (12.5 mg total) by mouth daily. 15 tablet 1  . Cholecalciferol  (VITAMIN D3) 3000 UNITS TABS Take 1,000 Units by mouth daily.    [name] levothyroxine (SYNTHROID, LEVOTHROID) 125 MCG tablet Take 125 mcg by mouth daily before breakfast.    . losartan (COZAAR) 100 MG tablet Take 1 tablet (100 mg total) by mouth daily. 90 tablet 3  . metoprolol succinate (TOPROL-XL) 25 MG 24 hr tablet Take one-half tablet by  mouth daily 45 tablet 0  . montelukast (SINGULAIR) 10 MG tablet Take 10 mg by mouth daily.    . Multiple Vitamins-Iron (MULTIVITAMIN/IRON PO) Take by mouth daily.     No current facility-administered medications for this visit.    Allergies  Allergen Reactions  . Milk-Related Compounds Other (See Comments)    REACTION: GI upset, projectile vomiting    Past Medical History  Diagnosis Date  . Hypertension   . Thyroid disease   . GERD (gastroesophageal reflux disease)   . Dyslipidemia   . Aortic insufficiency   . Mitral insufficiency   . History of nuclear stress test 02/26/2006    exercise myoview; normal pattern of perfusion; low risk scan   . PONV (postoperative nausea and vomiting)   . Heart murmur   . Hypothyroidism   . Cough     dry, endobronchial mass  . Pneumonia     pus bronchitis  . Arthritis   . Syncope     "states she has passed out a few times. dr is trying  to find cause.last time when geeting ready to go home after video bronch/bx  . Shortness of breath     with exertion  . Anxiety     due to surgery   . Bronchitis   . Radiation 12/01/13-01/08/14    50.4 gray to right central chest  . Atrial fibrillation ([place])   . Squamous cell carcinoma of lung (HCC)     Blood pressure 176/92, pulse 68, height '4\' 11"'$  (1.499 m), weight 162 lb 1.6 oz (73.528 kg).    Tommy Medal PharmD CPP [facility]

## 2015-09-02 NOTE — Patient Instructions (Signed)
Return for a a follow up appointment in 1 month  Your blood pressure today is 176/92  (goal is <150/90)  Check your blood pressure at home daily (if able) and keep record of the readings.  Take your BP meds as follows: add chlorthalidone 12.5 mg once daily in the morning; switch losartan 100 mg to evenings starting today Dec 29  Bring all of your meds, your BP cuff and your record of home blood pressures to your next appointment.  Exercise as you're able, try to walk approximately 30 minutes per day.  Keep salt intake to a minimum, especially watch canned and prepared boxed foods.  Eat more fresh fruits and vegetables and fewer canned items.  Avoid eating in fast food restaurants.    HOW TO TAKE YOUR BLOOD PRESSURE: . Rest 5 minutes before taking your blood pressure. .  Don't smoke or drink caffeinated beverages for at least 30 minutes before. . Take your blood pressure before (not after) you eat. . Sit comfortably with your back supported and both feet on the floor (don't cross your legs). . Elevate your arm to heart level on a table or a desk. . Use the proper sized cuff. It should fit smoothly and snugly around your bare upper arm. There should be enough room to slip a fingertip under the cuff. The bottom edge of the cuff should be 1 inch above the crease of the elbow. . Ideally, take 3 measurements at one sitting and record the average.

## 2015-09-22 DIAGNOSIS — D649 Anemia, unspecified: Secondary | ICD-10-CM | POA: Diagnosis not present

## 2015-09-22 DIAGNOSIS — I10 Essential (primary) hypertension: Secondary | ICD-10-CM | POA: Diagnosis not present

## 2015-09-22 DIAGNOSIS — E039 Hypothyroidism, unspecified: Secondary | ICD-10-CM | POA: Diagnosis not present

## 2015-09-22 DIAGNOSIS — E7800 Pure hypercholesterolemia, unspecified: Secondary | ICD-10-CM | POA: Diagnosis not present

## 2015-09-28 ENCOUNTER — Encounter: Payer: Self-pay | Admitting: Pharmacist Clinician (PhC)/ Clinical Pharmacy Specialist

## 2015-09-28 ENCOUNTER — Ambulatory Visit (INDEPENDENT_AMBULATORY_CARE_PROVIDER_SITE_OTHER): Payer: Medicare Other | Admitting: Pharmacist Clinician (PhC)/ Clinical Pharmacy Specialist

## 2015-09-28 VITALS — BP 148/78 | Ht 59.0 in | Wt 163.0 lb

## 2015-09-28 DIAGNOSIS — I10 Essential (primary) hypertension: Secondary | ICD-10-CM | POA: Diagnosis not present

## 2015-09-28 MED ORDER — CHLORTHALIDONE 25 MG PO TABS: 25.0000 mg | ORAL_TABLET | Freq: Every day | ORAL | Status: DC

## 2015-09-28 NOTE — Progress Notes (Signed)
09/28/2015 Kimberly [name] [name] 1940-10-21 [id number]   HPI:  Kimberly [name] is a 75 y.o. female patient of Dr Debara Pickett, with a PMH below who presents today for hypertension clinic follow up.  I saw her last in December and we added chlorthalidone 12.5 mg once daily.  She reports no problems with this medication, but does note that she still has occasional hot flashes.  She reports some difficulty getting a deep breath, but no tightness or chest pressure, believing this to be related to previous lung cancer treatment.      Cardiac Hx: mild LVH,    Social Hx: no tobacco, occasional alcohol.  Drinks 1-2 cups of coffee/day with the occasional caffeinated soda  Diet: eats mostly at home, has cut out salt in diet  Exercise: none, other than occasional yard work and some walking  Home BP readings: only 14 readings this month, 110-159/61-114  Only one diastolic reading >89, 5 systolic readings >381.    Current antihypertensive medications: losartan 100 mg, metoprolol succ 25 mg qd; chlorthalidone 12.5 mg   Current Outpatient Prescriptions  Medication Sig Dispense Refill  . albuterol (PROVENTIL HFA;VENTOLIN HFA) 108 (90 BASE) MCG/ACT inhaler Inhale 1 puff into the lungs every 6 (six) hours as needed for wheezing or shortness of breath.    [name] aspirin EC 81 MG tablet Take 81 mg by mouth daily.    [name] atorvastatin (LIPITOR) 20 MG tablet Take 20 mg by mouth every morning.     . calcium-vitamin D 250-100 MG-UNIT tablet Take 1 tablet by mouth daily.    . chlorthalidone (HYGROTON) 25 MG tablet Take 1 tablet (25 mg total) by mouth daily. 90 tablet 3  . Cholecalciferol (VITAMIN D3) 3000 UNITS TABS Take 1,000 Units by mouth daily.    [name] levothyroxine (SYNTHROID, LEVOTHROID) 150 MCG tablet Take 150 mcg by mouth daily.    [name] losartan (COZAAR) 100 MG tablet Take 1 tablet (100 mg total) by mouth daily. 90 tablet 3  . metoprolol succinate (TOPROL-XL) 25 MG 24 hr tablet Take one-half tablet by  mouth daily 45  tablet 0  . montelukast (SINGULAIR) 10 MG tablet Take 10 mg by mouth daily.    . Multiple Vitamins-Iron (MULTIVITAMIN/IRON PO) Take by mouth daily.     No current facility-administered medications for this visit.    Allergies  Allergen Reactions  . Milk-Related Compounds Other (See Comments)    REACTION: GI upset, projectile vomiting    Past Medical History  Diagnosis Date  . Hypertension   . Thyroid disease   . GERD (gastroesophageal reflux disease)   . Dyslipidemia   . Aortic insufficiency   . Mitral insufficiency   . History of nuclear stress test 02/26/2006    exercise myoview; normal pattern of perfusion; low risk scan   . PONV (postoperative nausea and vomiting)   . Heart murmur   . Hypothyroidism   . Cough     dry, endobronchial mass  . Pneumonia     pus bronchitis  . Arthritis   . Syncope     "states she has passed out a few times. dr is trying to find cause.last time when geeting ready to go home after video bronch/bx  . Shortness of breath     with exertion  . Anxiety     due to surgery   . Bronchitis   . Radiation 12/01/13-01/08/14    50.4 gray to right central chest  . Atrial fibrillation ([place])   . Squamous  cell carcinoma of lung (HCC)     Blood pressure 148/78, height '4\' 11"'$  (1.499 m), weight 163 lb (73.936 kg).    Tommy Medal PharmD CPP [facility]

## 2015-09-28 NOTE — Patient Instructions (Addendum)
Return for a a follow up appointment with Dr. Debara Pickett on Feb 28  Your blood pressure today is 148/78  (goal is <150/90)  Check your blood pressure at home daily and keep record of the readings.  Take your BP meds as follows:  Increase chlorthalidone to 25 mg once daily; continue with all other medications  Bring all of your meds, your BP cuff and your record of home blood pressures to your next appointment.  Exercise as you're able, try to walk approximately 30 minutes per day.  Keep salt intake to a minimum, especially watch canned and prepared boxed foods.  Eat more fresh fruits and vegetables and fewer canned items.  Avoid eating in fast food restaurants.    HOW TO TAKE YOUR BLOOD PRESSURE: . Rest 5 minutes before taking your blood pressure. .  Don't smoke or drink caffeinated beverages for at least 30 minutes before. . Take your blood pressure before (not after) you eat. . Sit comfortably with your back supported and both feet on the floor (don't cross your legs). . Elevate your arm to heart level on a table or a desk. . Use the proper sized cuff. It should fit smoothly and snugly around your bare upper arm. There should be enough room to slip a fingertip under the cuff. The bottom edge of the cuff should be 1 inch above the crease of the elbow. . Ideally, take 3 measurements at one sitting and record the average.

## 2015-09-28 NOTE — Assessment & Plan Note (Signed)
BP is much improved today, but would like to see pressure closer to 047 systolic and have fewer readings > 150.  Will increase chlorthalidone to 25 mg once daily.  Patient is due for follow up with Dr. Debara Pickett at the end of February.  She will monitor home readings and bring that information when she sees him.

## 2015-09-29 ENCOUNTER — Telehealth: Payer: Self-pay | Admitting: Internal Medicine

## 2015-09-29 DIAGNOSIS — D649 Anemia, unspecified: Secondary | ICD-10-CM | POA: Diagnosis not present

## 2015-09-29 DIAGNOSIS — N183 Chronic kidney disease, stage 3 (moderate): Secondary | ICD-10-CM | POA: Diagnosis not present

## 2015-09-29 DIAGNOSIS — Z0000 Encounter for general adult medical examination without abnormal findings: Secondary | ICD-10-CM | POA: Diagnosis not present

## 2015-09-29 DIAGNOSIS — M810 Age-related osteoporosis without current pathological fracture: Secondary | ICD-10-CM | POA: Diagnosis not present

## 2015-09-29 DIAGNOSIS — C3490 Malignant neoplasm of unspecified part of unspecified bronchus or lung: Secondary | ICD-10-CM | POA: Diagnosis not present

## 2015-09-29 NOTE — Telephone Encounter (Signed)
Received records from [facility] for appointment on  11/02/15 with Dr Debara Pickett.  Records given to [facility] (medical records) for Dr Lysbeth Penner schedule on 11/02/15. lp

## 2015-11-02 ENCOUNTER — Ambulatory Visit: Payer: Medicare Other | Admitting: Internal Medicine

## 2015-11-05 ENCOUNTER — Ambulatory Visit (INDEPENDENT_AMBULATORY_CARE_PROVIDER_SITE_OTHER): Payer: Medicare Other | Admitting: Internal Medicine

## 2015-11-05 ENCOUNTER — Encounter: Payer: Self-pay | Admitting: Internal Medicine

## 2015-11-05 VITALS — BP 146/80 | HR 66 | Ht <= 58 in | Wt 166.4 lb

## 2015-11-05 DIAGNOSIS — I10 Essential (primary) hypertension: Secondary | ICD-10-CM | POA: Diagnosis not present

## 2015-11-05 DIAGNOSIS — E785 Hyperlipidemia, unspecified: Secondary | ICD-10-CM

## 2015-11-05 DIAGNOSIS — C342 Malignant neoplasm of middle lobe, bronchus or lung: Secondary | ICD-10-CM | POA: Diagnosis not present

## 2015-11-05 DIAGNOSIS — I4891 Unspecified atrial fibrillation: Secondary | ICD-10-CM

## 2015-11-05 NOTE — Patient Instructions (Signed)
Your physician wants you to follow-up in: 6 months with Dr. Hilty. You will receive a reminder letter in the mail two months in advance. If you don't receive a letter, please call our office to schedule the follow-up appointment.    

## 2015-11-05 NOTE — Progress Notes (Signed)
OFFICE NOTE  Chief Complaint:   Routine follow-up  Primary Care Physician: [name], MD  HPI:  Kimberly [name] is a 75 year old female who recently has been having some chest discomfort and shortness of breath as well as 2 episodes of syncope, both of which were during stressful situations, once while on a ladder. We went ahead and checked an echocardiogram which essentially shows only mild abnormalities. She did have a loudly calcified aortic valve which was not stenotic; however, there was mild regurgitation. There was mild LVH with EF 55% to 60% and grade 1 diastolic dysfunction. She had borderline pulmonary hypertension. A monitor was also worn by her from September 13, 2012 to October 12, 2012. This showed a predominantly sinus rhythm with PACs. There was, however, 1 episode of a nonsustained ventricular tachycardia which was about 5 beats. Of course she potentially would have to had longer episodes of NSVT this could have been playing a role in her syncopal event. I do not think she, however, is at high risk for lethal sustained VT given her normal ejection fraction and no clear evidence of ischemia. As you recall, she had a low-risk MET-test on April 02, 2012, with a peak VO2 of 77% predicted without any evidence of an ischemic response. This would make it quite unlikely that she has underlying coronary blockage that is responsible for her episode. I have, however, recommended adding a beta blocker to her regimen which will additionally help control her recent uncontrolled hypertension. This is Toprol XL 12.5 mg daily. It should help also with the VT. she is continuing to take low-dose Toprol and has had no further episodes of passing out in the past 6 months. She was thoroughly evaluated by a neurologist and felt to possibly be having a vasodepressive syncope. There was lower extremity.swelling and compression stockings were recommended.  Kimberly [name] returns today for followup.  She has been hospitalized a number of times since getting a diagnosis of lung cancer. 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 Unfortunately she developed A. fib with RVR and was treated and placed on amiodarone. She's been having problems with recurrent vasovagal syncope and hypotension. She does have a mass that is pressing against the atrium.  I saw Kimberly [name] back in the office today. She is undergoing chemotherapy which is cause some mild shrinkage of her tumor however it is not disappeared, and apparently is incurable and unresectable. Fortunate she's had no recurrence of her A. fib and remains on low-dose amiodarone. She says that she feels better than she had when she was undergoing radiation chemotherapy. She has no new complaints.  Kimberly [name] returns today for follow-up. She seems to be holding her own with regards for lung cancer. She has another 4 month reprieve to see her oncologist. Her most recent scan showed a slight increase in the size of her cancer with another small tumor but she seems to be taking this pretty well. She's not had recurrent A. fib from what I can tell and maintains on low-dose amiodarone. She is due for repeat lipid testing as well as surveillance thyroid testing.   Kimberly [name] returns today for follow-up. She denies any chest pain although does get short of breath with some activities. She reports that her lung cancer is still growing at a slow rate however  She was intolerant of extensive chemotherapy and is now on a chemotherapy break. 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 Since she's not had A. Fib in some time I thought it would be reasonable to take her off the medicine to see if there would be any recurrence. She's had some vision loss unrelated to amiodarone and we did not like to see  that adding to it.  PMHx:  Past Medical History  Diagnosis Date  . Hypertension   . Thyroid disease   . GERD (gastroesophageal reflux disease)   . Dyslipidemia   . Aortic insufficiency   . Mitral insufficiency   . History of nuclear stress test 02/26/2006    exercise myoview; normal pattern of perfusion; low risk scan   . PONV (postoperative nausea and vomiting)   . Heart murmur   . Hypothyroidism   . Cough     dry, endobronchial mass  . Pneumonia     pus bronchitis  . Arthritis   . Syncope     "states she has passed out a few times. dr is trying to find cause.last time when geeting ready to go home after video bronch/bx  . Shortness of breath     with exertion  . Anxiety     due to surgery   . Bronchitis   . Radiation 12/01/13-01/08/14    50.4 gray to right central chest  . Atrial fibrillation ([place])   . Squamous cell carcinoma of lung [facility])     Past Surgical History  Procedure Laterality Date  . Thyroidectomy  1973  . Dilation and curettage of uterus    . Carpal tunnel release Right 1988  . Rotator cuff repair  2006    ? side  . H/o met test w/pft  04/02/2012    low risk; peak VO2 77% predicted  . Cardiac catheterization  07/08/2008    normal coronaries  . Transthoracic echocardiogram  09/25/2012    EF 55-60%; mild LVH & mild concentric hypertrophy; mild AV regurg; RV systolic pressure increase consistent with mild pulm HTN  . Video bronchoscopy Bilateral 09/25/2013    Procedure: VIDEO BRONCHOSCOPY WITHOUT FLUORO;  Surgeon: Tanda Rockers, MD;  Location: [facility];  Service: Cardiopulmonary;  Laterality: Bilateral;  . Joint replacement  2003    thumb rt  . Knee arthroscopy  12    rt meniscus  . Video bronchoscopy with endobronchial ultrasound N/A 10/30/2013    Procedure: VIDEO BRONCHOSCOPY WITH ENDOBRONCHIAL ULTRASOUND;  Surgeon: Melrose Nakayama, MD;  Location: [place];  Service: Thoracic;  Laterality: N/A;  . Mediastinoscopy N/A 10/30/2013    Procedure:  MEDIASTINOSCOPY;  Surgeon: Melrose Nakayama, MD;  Location: [facility];  Service: Thoracic;  Laterality: N/A;  . Eye surgery Bilateral   . Video assisted thoracoscopy (vats)/ lobectomy Right 11/13/2013    Procedure: VIDEO ASSISTED THORACOSCOPY (VATS) with mediastinal  biopsies;  Surgeon: Melrose Nakayama, MD;  Location: [place];  Service: Thoracic;  Laterality: Right;  RIGHT VATS,mediastinal biopsies    FAMHx:  Family History  Problem Relation Age of Onset  . Lung cancer Mother   . Heart attack Father   . Heart failure Maternal Grandfather   . Heart attack Paternal Grandfather     SOCHx:   reports that she quit smoking about 48 years ago. Her smoking use included Cigarettes. She has a 3 pack-year smoking history. She has never used smokeless tobacco. She reports that she drinks alcohol. She reports that she does not use illicit drugs.  ALLERGIES:  Allergies  Allergen Reactions  . Milk-Related Compounds Other (See Comments)    REACTION: GI upset, projectile vomiting    ROS: Pertinent items noted in HPI and remainder of comprehensive ROS otherwise negative.  HOME MEDS: Current Outpatient Prescriptions  Medication Sig Dispense Refill  . albuterol (PROVENTIL HFA;VENTOLIN HFA) 108 (90 BASE) MCG/ACT inhaler Inhale 1 puff into the lungs every 6 (six) hours as needed for wheezing or shortness of breath.    [name] aspirin EC 81 MG tablet Take 81 mg by mouth daily.    [name] atorvastatin (LIPITOR) 20 MG tablet Take 20 mg by mouth every morning.     . calcium-vitamin D 250-100 MG-UNIT tablet Take 1 tablet by mouth daily.    . chlorthalidone (HYGROTON) 25 MG tablet Take 1 tablet (25 mg total) by mouth daily. 90 tablet 3  . Cholecalciferol (VITAMIN D-3 PO) Take 1,000 Units by mouth daily.    . Ibuprofen (ADVIL PO) Take 1 capsule by mouth as needed.    [name] levothyroxine (SYNTHROID, LEVOTHROID) 150 MCG tablet Take 150 mcg by mouth daily.    [name] losartan (COZAAR) 100 MG tablet Take 1 tablet (100 mg total)  by mouth daily. 90 tablet 3  . metoprolol succinate (TOPROL-XL) 25 MG 24 hr tablet Take one-half tablet by  mouth daily 45 tablet 0  . montelukast (SINGULAIR) 10 MG tablet Take 10 mg by mouth daily.    . Multiple Vitamins-Iron (MULTIVITAMIN/IRON PO) Take by mouth daily.     No current facility-administered medications for this visit.    LABS/IMAGING: No results found for this or any previous visit (from the past 48 hour(s)). No results found.  VITALS: BP 146/80 mmHg  Pulse 66  Ht _0  (1.473 m)  Wt 166 lb 6.4 oz (75.479 kg)  BMI 34.79 kg/m2  SpO2 98%  EXAM: GEN: Awake, nad HEENT: PERRLA, EOMI LUNGS: Clear bilaterally CV: RRR, s1/s2, no MRG's ABD: S/NT, +BS EXT: No edema NEURO: A&O x 3, grossly non-focal SKIN: DRY, warm, no rashes PSYCH: Mood, affect normal  EKG:  Normal sinus rhythm at 66, PVCs  ASSESSMENT: 1. Lung cancer (non-small cell stage III) 2. Recurrent vasodepressor syncope  3. Lower extremity edema 4. Hypertension - blood pressure now elevated 5. Paroxysmal atrial fibrillation  PLAN: 1.   Mrs. Hollick seems to be doing fairly well although her lung cancer may be progressing.  She's not had recurrent A. Fib and we have now taken her off of amiodarone due to corneal deposits. Blood pressure actually is much better controlled.  Her shortness of breath is fairly stable. We did ambulate her today around the office and her oxygen saturation did not go below 95%.   Plan follow-up in 6 months.  Pixie Casino, MD, [facility] Attending Cardiologist [place] 11/05/2015, 1:46 PM

## 2015-11-19 DIAGNOSIS — E039 Hypothyroidism, unspecified: Secondary | ICD-10-CM | POA: Diagnosis not present

## 2015-11-23 DIAGNOSIS — D531 Other megaloblastic anemias, not elsewhere classified: Secondary | ICD-10-CM | POA: Diagnosis not present

## 2015-11-23 DIAGNOSIS — M5442 Lumbago with sciatica, left side: Secondary | ICD-10-CM | POA: Diagnosis not present

## 2015-11-23 DIAGNOSIS — M5136 Other intervertebral disc degeneration, lumbar region: Secondary | ICD-10-CM | POA: Diagnosis not present

## 2015-11-23 DIAGNOSIS — M5441 Lumbago with sciatica, right side: Secondary | ICD-10-CM | POA: Diagnosis not present

## 2015-11-23 DIAGNOSIS — E039 Hypothyroidism, unspecified: Secondary | ICD-10-CM | POA: Diagnosis not present

## 2015-11-30 DIAGNOSIS — M545 Low back pain: Secondary | ICD-10-CM | POA: Diagnosis not present

## 2015-12-07 DIAGNOSIS — M4316 Spondylolisthesis, lumbar region: Secondary | ICD-10-CM | POA: Diagnosis not present

## 2015-12-14 DIAGNOSIS — M545 Low back pain: Secondary | ICD-10-CM | POA: Diagnosis not present

## 2016-01-06 ENCOUNTER — Ambulatory Visit (HOSPITAL_COMMUNITY)
Admission: RE | Admit: 2016-01-06 | Discharge: 2016-01-06 | Disposition: A | Discharge status: Home / Self Care | Payer: Medicare Other | Source: Ambulatory Visit | Attending: Internal Medicine | Admitting: Internal Medicine

## 2016-01-06 ENCOUNTER — Encounter (HOSPITAL_COMMUNITY): Payer: Self-pay

## 2016-01-06 ENCOUNTER — Other Ambulatory Visit (HOSPITAL_BASED_OUTPATIENT_CLINIC_OR_DEPARTMENT_OTHER): Payer: Medicare Other

## 2016-01-06 DIAGNOSIS — I2510 Atherosclerotic heart disease of native coronary artery without angina pectoris: Secondary | ICD-10-CM | POA: Diagnosis not present

## 2016-01-06 DIAGNOSIS — C342 Malignant neoplasm of middle lobe, bronchus or lung: Secondary | ICD-10-CM

## 2016-01-06 DIAGNOSIS — J701 Chronic and other pulmonary manifestations due to radiation: Secondary | ICD-10-CM | POA: Diagnosis not present

## 2016-01-06 DIAGNOSIS — J8410 Pulmonary fibrosis, unspecified: Secondary | ICD-10-CM | POA: Diagnosis not present

## 2016-01-06 DIAGNOSIS — I700 Atherosclerosis of aorta: Secondary | ICD-10-CM | POA: Insufficient documentation

## 2016-01-06 LAB — CBC WITH DIFFERENTIAL/PLATELET
BASO%: 0.7 % (ref 0.0–2.0)
Basophils Absolute: 0.1 10*3/uL (ref 0.0–0.1)
EOS ABS: 0.2 10*3/uL (ref 0.0–0.5)
EOS%: 2.9 % (ref 0.0–7.0)
HCT: 33.7 % — ABNORMAL LOW (ref 34.8–46.6)
HGB: 11.2 g/dL — ABNORMAL LOW (ref 11.6–15.9)
LYMPH%: 20.9 % (ref 14.0–49.7)
MCH: 32.4 pg (ref 25.1–34.0)
MCHC: 33.2 g/dL (ref 31.5–36.0)
MCV: 97.4 fL (ref 79.5–101.0)
MONO#: 0.5 10*3/uL (ref 0.1–0.9)
MONO%: 6.8 % (ref 0.0–14.0)
NEUT#: 5 10*3/uL (ref 1.5–6.5)
NEUT%: 68.7 % (ref 38.4–76.8)
PLATELETS: 234 10*3/uL (ref 145–400)
RBC: 3.46 10*6/uL — AB (ref 3.70–5.45)
RDW: 13 % (ref 11.2–14.5)
WBC: 7.2 10*3/uL (ref 3.9–10.3)
lymph#: 1.5 10*3/uL (ref 0.9–3.3)

## 2016-01-06 LAB — COMPREHENSIVE METABOLIC PANEL
ALK PHOS: 93 U/L (ref 40–150)
ALT: 21 U/L (ref 0–55)
ANION GAP: 8 meq/L (ref 3–11)
AST: 23 U/L (ref 5–34)
Albumin: 3.7 g/dL (ref 3.5–5.0)
BUN: 23.3 mg/dL (ref 7.0–26.0)
CHLORIDE: 105 meq/L (ref 98–109)
CO2: 28 meq/L (ref 22–29)
Calcium: 9.7 mg/dL (ref 8.4–10.4)
Creatinine: 1.1 mg/dL (ref 0.6–1.1)
EGFR: 52 mL/min/{1.73_m2} — AB (ref >90)
Glucose: 80 mg/dl (ref 70–140)
Potassium: 4.8 mEq/L (ref 3.5–5.1)
Sodium: 140 mEq/L (ref 136–145)
Total Bilirubin: 0.49 mg/dL (ref 0.20–1.20)
Total Protein: 6.7 g/dL (ref 6.4–8.3)

## 2016-01-06 MED ORDER — IOPAMIDOL (ISOVUE-300) INJECTION 61%
75.0000 mL | Freq: Once | INTRAVENOUS | Status: AC | PRN
  Administered 2016-01-06: 75 mL via INTRAVENOUS

## 2016-01-11 DIAGNOSIS — E1122 Type 2 diabetes mellitus with diabetic chronic kidney disease: Secondary | ICD-10-CM | POA: Diagnosis not present

## 2016-01-11 DIAGNOSIS — E7800 Pure hypercholesterolemia, unspecified: Secondary | ICD-10-CM | POA: Diagnosis not present

## 2016-01-11 DIAGNOSIS — I10 Essential (primary) hypertension: Secondary | ICD-10-CM | POA: Diagnosis not present

## 2016-01-13 ENCOUNTER — Telehealth: Payer: Self-pay | Admitting: Internal Medicine

## 2016-01-13 ENCOUNTER — Encounter: Payer: Self-pay | Admitting: Internal Medicine

## 2016-01-13 ENCOUNTER — Ambulatory Visit (HOSPITAL_BASED_OUTPATIENT_CLINIC_OR_DEPARTMENT_OTHER): Payer: Medicare Other | Admitting: Internal Medicine

## 2016-01-13 VITALS — BP 138/69 | HR 85 | Temp 97.8°F | Resp 18 | Ht <= 58 in | Wt 165.0 lb

## 2016-01-13 DIAGNOSIS — D649 Anemia, unspecified: Secondary | ICD-10-CM

## 2016-01-13 DIAGNOSIS — C342 Malignant neoplasm of middle lobe, bronchus or lung: Secondary | ICD-10-CM

## 2016-01-13 NOTE — Telephone Encounter (Signed)
Gave pt avs °

## 2016-01-13 NOTE — Progress Notes (Signed)
[place] [phone]   [phone]  OFFICE NOTE  [name], MD [place] [id number]  DIAGNOSIS: Unresectable, likely a stage IIIA (T2a, N2, M0) non-small cell lung cancer, squamous cell carcinoma diagnosed in February 2015.  PRIOR THERAPY:  1) On 11/13/2013 the patient underwent exploratory right VATS with mediastinal biopsy under the care of Dr. Roxan Hockey. 2) Concurrent chemoradiation with weekly carboplatin for AUC of 2 and paclitaxel 45 mg/M2, status post 6 cycles with partial response. First cycle was given on 12/01/2013. 3) Consolidation chemotherapy with carboplatin for AUC of 5 and paclitaxel 175 mg/M2 every 3 weeks with Neulasta support. First dose 02/23/2014. Status post 3 cycles with partial response.   CURRENT THERAPY: Observation.  INTERVAL HISTORY: Kimberly [name] 75 y.o. female returns to the clinic today for followup visit. The patient is feeling much better today with no significant complaints except for persistent mild dry cough. She denied having any significant no fever or chills. She denied having any chest pain, shortness of breath, or hemoptysis. No significant weight loss or night sweats. She had repeat CT scan of the chest performed recently and she is here for evaluation and discussion of her scan results.  MEDICAL HISTORY: Past Medical History  Diagnosis Date  . Hypertension   . Thyroid disease   . GERD (gastroesophageal reflux disease)   . Dyslipidemia   . Aortic insufficiency   . Mitral insufficiency   . History of nuclear stress test 02/26/2006    exercise myoview; normal pattern of perfusion; low risk scan   . PONV (postoperative nausea and vomiting)   . Heart murmur   . Hypothyroidism   . Cough     dry, endobronchial mass  . Pneumonia     pus bronchitis  . Arthritis   . Syncope     "states she has passed out a few times. dr is trying to find cause.last time  when geeting ready to go home after video bronch/bx  . Shortness of breath     with exertion  . Anxiety     due to surgery   . Bronchitis   . Radiation 12/01/13-01/08/14    50.4 gray to right central chest  . Atrial fibrillation ([place])   . Squamous cell carcinoma of lung ([facility])     ALLERGIES:  is allergic to amiodarone and milk-related compounds.  MEDICATIONS:  Current Outpatient Prescriptions  Medication Sig Dispense Refill  . aspirin EC 81 MG tablet Take 81 mg by mouth daily.    [name] atorvastatin (LIPITOR) 20 MG tablet Take 20 mg by mouth every morning.     . calcium-vitamin D 250-100 MG-UNIT tablet Take 1 tablet by mouth daily.    . chlorthalidone (HYGROTON) 25 MG tablet Take 1 tablet (25 mg total) by mouth daily. 90 tablet 3  . Cholecalciferol (VITAMIN D-3 PO) Take 1,000 Units by mouth daily.    . Ibuprofen (ADVIL PO) Take 1 capsule by mouth as needed.    [name] levothyroxine (SYNTHROID, LEVOTHROID) 175 MCG tablet Take 175 mcg by mouth daily.  1  . losartan (COZAAR) 100 MG tablet Take 1 tablet (100 mg total) by mouth daily. 90 tablet 3  . metoprolol succinate (TOPROL-XL) 25 MG 24 hr tablet Take one-half tablet by  mouth daily 45 tablet 0  . montelukast (SINGULAIR) 10 MG tablet Take 10 mg by mouth daily.    . Multiple Vitamins-Iron (MULTIVITAMIN/IRON PO) Take by mouth daily.    [name]  albuterol (PROVENTIL HFA;VENTOLIN HFA) 108 (90 BASE) MCG/ACT inhaler Inhale 1 puff into the lungs every 6 (six) hours as needed for wheezing or shortness of breath. Reported on 01/13/2016     No current facility-administered medications for this visit.    SURGICAL HISTORY:  Past Surgical History  Procedure Laterality Date  . Thyroidectomy  1973  . Dilation and curettage of uterus    . Carpal tunnel release Right 1988  . Rotator cuff repair  2006    ? side  . H/o met test w/pft  04/02/2012    low risk; peak VO2 77% predicted  . Cardiac catheterization  07/08/2008    normal coronaries  . Transthoracic  echocardiogram  09/25/2012    EF 55-60%; mild LVH & mild concentric hypertrophy; mild AV regurg; RV systolic pressure increase consistent with mild pulm HTN  . Video bronchoscopy Bilateral 09/25/2013    Procedure: VIDEO BRONCHOSCOPY WITHOUT FLUORO;  Surgeon: Tanda Rockers, MD;  Location: [facility];  Service: Cardiopulmonary;  Laterality: Bilateral;  . Joint replacement  2003    thumb rt  . Knee arthroscopy  12    rt meniscus  . Video bronchoscopy with endobronchial ultrasound N/A 10/30/2013    Procedure: VIDEO BRONCHOSCOPY WITH ENDOBRONCHIAL ULTRASOUND;  Surgeon: Melrose Nakayama, MD;  Location: [facility];  Service: Thoracic;  Laterality: N/A;  . Mediastinoscopy N/A 10/30/2013    Procedure: MEDIASTINOSCOPY;  Surgeon: Melrose Nakayama, MD;  Location: [facility];  Service: Thoracic;  Laterality: N/A;  . Eye surgery Bilateral   . Video assisted thoracoscopy (vats)/ lobectomy Right 11/13/2013    Procedure: VIDEO ASSISTED THORACOSCOPY (VATS) with mediastinal  biopsies;  Surgeon: Melrose Nakayama, MD;  Location: [facility];  Service: Thoracic;  Laterality: Right;  RIGHT VATS,mediastinal biopsies    REVIEW OF SYSTEMS:  A comprehensive review of systems was negative except for: Respiratory: positive for cough   PHYSICAL EXAMINATION: General appearance: alert, cooperative, fatigued and no distress Head: Normocephalic, without obvious abnormality, atraumatic Neck: no adenopathy, no JVD, supple, symmetrical, trachea midline and thyroid not enlarged, symmetric, no tenderness/mass/nodules Lymph nodes: Cervical, supraclavicular, and axillary nodes normal. Resp: clear to auscultation bilaterally Back: symmetric, no curvature. ROM normal. No CVA tenderness. Cardio: regular rate and rhythm, S1, S2 normal, no murmur, click, rub or gallop GI: soft, non-tender; bowel sounds normal; no masses,  no organomegaly Extremities: extremities normal, atraumatic, no cyanosis or edema Neurologic: Alert and oriented X 3,  normal strength and tone. Normal symmetric reflexes. Normal coordination and gait  ECOG PERFORMANCE STATUS: 1 - Symptomatic but completely ambulatory  Blood pressure 138/69, pulse 85, temperature 97.8 F (36.6 C), temperature source Oral, resp. rate 18, height _0  (1.473 m), weight 165 lb (74.844 kg), SpO2 99 %.  LABORATORY DATA: Lab Results  Component Value Date   WBC 7.2 01/06/2016   HGB 11.2* 01/06/2016   HCT 33.7* 01/06/2016   MCV 97.4 01/06/2016   PLT 234 01/06/2016      Chemistry      Component Value Date/Time   NA 140 01/06/2016 1119   NA 131* 04/13/2014 0814   K 4.8 01/06/2016 1119   K 4.5 04/13/2014 0814   CL 96 04/13/2014 0814   CO2 28 01/06/2016 1119   CO2 24 04/13/2014 0814   BUN 23.3 01/06/2016 1119   BUN 16 04/13/2014 0814   CREATININE 1.1 01/06/2016 1119   CREATININE 1.01 04/13/2014 0814      Component Value Date/Time   CALCIUM 9.7 01/06/2016 1119  CALCIUM 9.3 04/13/2014 0814   ALKPHOS 93 01/06/2016 1119   ALKPHOS 82 04/13/2014 0814   AST 23 01/06/2016 1119   AST 17 04/13/2014 0814   ALT 21 01/06/2016 1119   ALT 27 04/13/2014 0814   BILITOT 0.49 01/06/2016 1119   BILITOT 0.6 04/13/2014 0814       RADIOGRAPHIC STUDIES: Ct Chest W Contrast  01/06/2016  CLINICAL DATA:  Restaging of right-sided lung cancer, diagnosed 8/15. Middle lobe primary. EXAM: CT CHEST WITH CONTRAST TECHNIQUE: Multidetector CT imaging of the chest was performed during intravenous contrast administration. CONTRAST:  12m ISOVUE-300 IOPAMIDOL (ISOVUE-300) INJECTION 61% COMPARISON:  07/02/2015 FINDINGS: Mediastinum/Nodes: Thyroidectomy. No supraclavicular adenopathy. Volume loss in the right hemi thorax. Aortic and branch vessel atherosclerosis. Borderline cardiomegaly. No pericardial effusion. Lad and right coronary artery atherosclerosis. No central pulmonary embolism, on this non-dedicated study. Soft tissue thickening in the right-sided mediastinum is similar, and likely radiation  induced. Example image 35/ series 2. No well-defined adenopathy. No hilar adenopathy. Tiny hiatal hernia. Lungs/Pleura: Similar mild right-sided pleural thickening. Minimal right lower lobe subpleural nodularity on image 62/series 5, felt to be similar. Calcified left lower lobe granulomas are again identified. Similar distribution of right lower lobe, right middle lobe, and paramediastinal right upper lobe radiation fibrosis. No well-defined residual lesion. Upper abdomen: Normal imaged portions of the liver, spleen, pancreas, gallbladder, adrenal glands, kidneys. Musculoskeletal: Left supraspinatus lipoma. No acute osseous abnormality. IMPRESSION: 1. Similar appearance of the chest. Radiation fibrosis throughout the right lung with resultant volume loss. No locally recurrent or metastatic disease. 2.  Atherosclerosis, including within the coronary arteries. Electronically Signed   By: KAbigail MiyamotoM.D.   On: 01/06/2016 14:47   ASSESSMENT AND PLAN:  1) unresectable stage IIIA non-small cell lung cancer:  This is a very pleasant 75years old white female with unresectable stage IIIA non-small cell lung cancer completed a course of concurrent chemoradiation with weekly carboplatin and paclitaxel status post 6 cycles.  This is followed by 3 cycles of consolidation chemotherapy with reduced dose carboplatin and paclitaxel. The recent CT scan of the chest showed no evidence for disease progression.  I discussed the scan results with the patient. I recommended for her to continue on observation with repeat CT scan of the chest in 6 months with restaging of her disease.    For the persistent anemia, the patient was advised to take over-the-counter oral iron supplements.                                                                                              She was advised to call immediately if she has any concerning symptoms in the interval. The patient voices understanding of current disease status and  treatment options and is in agreement with the current care plan.  All questions were answered. The patient knows to call the clinic with any problems, questions or concerns. We can certainly see the patient much sooner if necessary.  Disclaimer: This note was dictated with voice recognition software. Similar sounding words can inadvertently be transcribed and may not be corrected upon review.

## 2016-01-14 ENCOUNTER — Other Ambulatory Visit: Payer: Self-pay | Admitting: Internal Medicine

## 2016-01-21 DIAGNOSIS — R197 Diarrhea, unspecified: Secondary | ICD-10-CM | POA: Diagnosis not present

## 2016-01-21 DIAGNOSIS — R1030 Lower abdominal pain, unspecified: Secondary | ICD-10-CM | POA: Diagnosis not present

## 2016-01-24 DIAGNOSIS — Z1212 Encounter for screening for malignant neoplasm of rectum: Secondary | ICD-10-CM | POA: Diagnosis not present

## 2016-01-24 DIAGNOSIS — R197 Diarrhea, unspecified: Secondary | ICD-10-CM | POA: Diagnosis not present

## 2016-02-02 DIAGNOSIS — C3490 Malignant neoplasm of unspecified part of unspecified bronchus or lung: Secondary | ICD-10-CM | POA: Diagnosis not present

## 2016-02-02 DIAGNOSIS — I10 Essential (primary) hypertension: Secondary | ICD-10-CM | POA: Diagnosis not present

## 2016-02-02 DIAGNOSIS — R197 Diarrhea, unspecified: Secondary | ICD-10-CM | POA: Diagnosis not present

## 2016-02-02 DIAGNOSIS — Z86010 Personal history of colonic polyps: Secondary | ICD-10-CM | POA: Diagnosis not present

## 2016-02-08 ENCOUNTER — Encounter (HOSPITAL_COMMUNITY): Payer: Self-pay | Admitting: Anesthesiology

## 2016-02-08 ENCOUNTER — Other Ambulatory Visit: Payer: Self-pay | Admitting: Gastroenterology

## 2016-02-08 ENCOUNTER — Encounter (HOSPITAL_COMMUNITY): Payer: Self-pay | Admitting: *Deleted

## 2016-02-08 NOTE — Anesthesia Preprocedure Evaluation (Deleted)
Anesthesia Evaluation  Patient identified by MRN, date of birth, ID band Patient awake    Reviewed: Allergy & Precautions, NPO status , Patient's Chart, lab work & pertinent test results  Airway        Dental   Pulmonary neg pulmonary ROS, asthma (inhalers) , former smoker (quit 1969),  Stage III non small cell lung CA, SP Lobectomy, radiation and chemo, last CT showed no increase in tumor size          Cardiovascular hypertension, Pt. on medications negative cardio ROS    ECHO EF 55%, mild mitral insufficiency, AF in past, none recently      Neuro/Psych Anxiety negative neurological ROS  negative psych ROS   GI/Hepatic negative GI ROS, Neg liver ROS, GERD  Medicated,  Endo/Other  negative endocrine ROS  Renal/GU negative Renal ROS  negative genitourinary   Musculoskeletal negative musculoskeletal ROS (+)   Abdominal   Peds negative pediatric ROS (+)  Hematology negative hematology ROS (+) 11/34   Anesthesia Other Findings   Reproductive/Obstetrics negative OB ROS                             Anesthesia Physical Anesthesia Plan  ASA: III  Anesthesia Plan: MAC   Post-op Pain Management:    Induction: Intravenous  Airway Management Planned: Nasal Cannula  Additional Equipment:   Intra-op Plan:   Post-operative Plan:   Informed Consent: I have reviewed the patients History and Physical, chart, labs and discussed the procedure including the risks, benefits and alternatives for the proposed anesthesia with the patient or authorized representative who has indicated his/her understanding and acceptance.     Plan Discussed with:   Anesthesia Plan Comments:         Anesthesia Quick Evaluation

## 2016-02-10 NOTE — H&P (Signed)
Kimberly [name] HPI: The patient started to have diarrhea and lower abdominal pain earlier in the month. Her symptoms started acutely to have her bowel movements and it was watery at first, but now it varies between loose to watery. Prior to this time her stools were "regular". 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 This is inoperable and the growth of the cancer is slow. She was diagnosed two years ago. Her treatment was 2 years ago with chemo/XRT. She reports that Dr. Shelia Media identified an E. coli infecton from her stool sample. Her last colonoscopy was with Dr. Lajoyce Corners with findings of some polyps.  Past Medical History  Diagnosis Date  . Hypertension   . Thyroid disease   . GERD (gastroesophageal reflux disease)   . Dyslipidemia   . Aortic insufficiency   . Mitral insufficiency   . History of nuclear stress test 02/26/2006    exercise myoview; normal pattern of perfusion; low risk scan   . PONV (postoperative nausea and vomiting)   . Heart murmur   . Hypothyroidism   . Cough     dry, endobronchial mass  . Pneumonia     pus bronchitis  . Arthritis   . Syncope     "states she has passed out a few times. dr is trying to find cause.last time when geeting ready to go home after video bronch/bx  . Shortness of breath     with exertion  . Anxiety     due to surgery   . Bronchitis   . Radiation 12/01/13-01/08/14    50.4 gray to right central chest  . Atrial fibrillation ([place])   . Squamous cell carcinoma of lung [facility])     Past Surgical History  Procedure Laterality Date  . Thyroidectomy  1973  . Dilation and curettage of uterus    . Carpal tunnel release Right 1988  . Rotator cuff repair  2006    ? side  . H/o met test w/pft  04/02/2012    low risk; peak VO2 77% predicted  . Cardiac catheterization  07/08/2008    normal coronaries  . Transthoracic echocardiogram  09/25/2012    EF  55-60%; mild LVH & mild concentric hypertrophy; mild AV regurg; RV systolic pressure increase consistent with mild pulm HTN  . Video bronchoscopy Bilateral 09/25/2013    Procedure: VIDEO BRONCHOSCOPY WITHOUT FLUORO;  Surgeon: Tanda Rockers, MD;  Location: [facility];  Service: Cardiopulmonary;  Laterality: Bilateral;  . Joint replacement  2003    thumb rt  . Knee arthroscopy  12    rt meniscus  . Video bronchoscopy with endobronchial ultrasound N/A 10/30/2013    Procedure: VIDEO BRONCHOSCOPY WITH ENDOBRONCHIAL ULTRASOUND;  Surgeon: Melrose Nakayama, MD;  Location: [place];  Service: Thoracic;  Laterality: N/A;  . Mediastinoscopy N/A 10/30/2013    Procedure: MEDIASTINOSCOPY;  Surgeon: Melrose Nakayama, MD;  Location: [place];  Service: Thoracic;  Laterality: N/A;  . Eye surgery Bilateral   . Video assisted thoracoscopy (vats)/ lobectomy Right 11/13/2013    Procedure: VIDEO ASSISTED THORACOSCOPY (VATS) with mediastinal  biopsies;  Surgeon: Melrose Nakayama, MD;  Location: [place];  Service: Thoracic;  Laterality: Right;  RIGHT VATS,mediastinal biopsies    Family History  Problem Relation Age of Onset  . Lung cancer Mother   . Heart attack Father   . Heart failure Maternal Grandfather   .  Heart attack Paternal Grandfather     Social History:  reports that she quit smoking about 48 years ago. Her smoking use included Cigarettes. She has a 3 pack-year smoking history. She has never used smokeless tobacco. She reports that she drinks alcohol. She reports that she does not use illicit drugs.  Allergies:  Allergies  Allergen Reactions  . Amiodarone Other (See Comments)    Corneal deposits  . Milk-Related Compounds Other (See Comments)    GI upset, projectile vomiting - can't take any dairy products    Medications: Scheduled: Continuous:  No results found for this or any previous visit (from the past 24 hour(s)).   No results found.  ROS:  As stated above in the HPI otherwise  negative.  There were no vitals taken for this visit.    PE: Gen: NAD, Alert and Oriented HEENT:  [place]/AT, EOMI Neck: Supple, no LAD Lungs: CTA Bilaterally CV: RRR without M/G/R ABM: Soft, NTND, +BS Ext: No C/C/E  Assessment/Plan: 1) Diarrhea - I will obtain the stool study results from Dr. Pennie Banter office to see about the E. coli pathogen. If the E. coli is nonpathogenic her diarrhea could be a manifestation of microscopic colitis. I will schedule her for a colonoscopy with biopsies. I have discussed the risks of bleeding, infection, perforation, medication reactions, a 10% miss rate for a small colon cancer or polyp, and the risk of death. All questions were answered and the patient acknowledges these risks and wishes to proceed.   ADDENDUM: The patient was identified to have Enterpathogic E. coli. In adults it is typically not an issue, but in children it causes more of a diarrheal illness. Supportive care is typically rendered. I still have a suspicion about her diarrhea at her age and the acute onset for microscopic colitis. I will still pursue the colonoscopy.   Blanche Gallien D 02/10/2016, 2:08 PM

## 2016-02-11 ENCOUNTER — Encounter (HOSPITAL_COMMUNITY)
Admission: RE | Disposition: A | Discharge status: Home / Self Care | Payer: Self-pay | Source: Ambulatory Visit | Attending: Gastroenterology

## 2016-02-11 ENCOUNTER — Ambulatory Visit (HOSPITAL_COMMUNITY)
Admission: RE | Admit: 2016-02-11 | Discharge: 2016-02-11 | Disposition: A | Discharge status: Home / Self Care | Payer: Medicare Other | Source: Ambulatory Visit | Attending: Gastroenterology | Admitting: Gastroenterology

## 2016-02-11 ENCOUNTER — Encounter (HOSPITAL_COMMUNITY): Payer: Self-pay

## 2016-02-11 DIAGNOSIS — M1990 Unspecified osteoarthritis, unspecified site: Secondary | ICD-10-CM | POA: Insufficient documentation

## 2016-02-11 DIAGNOSIS — Z87891 Personal history of nicotine dependence: Secondary | ICD-10-CM | POA: Diagnosis not present

## 2016-02-11 DIAGNOSIS — I4891 Unspecified atrial fibrillation: Secondary | ICD-10-CM | POA: Insufficient documentation

## 2016-02-11 DIAGNOSIS — K529 Noninfective gastroenteritis and colitis, unspecified: Secondary | ICD-10-CM | POA: Diagnosis not present

## 2016-02-11 DIAGNOSIS — K5730 Diverticulosis of large intestine without perforation or abscess without bleeding: Secondary | ICD-10-CM | POA: Insufficient documentation

## 2016-02-11 DIAGNOSIS — R197 Diarrhea, unspecified: Secondary | ICD-10-CM | POA: Diagnosis not present

## 2016-02-11 DIAGNOSIS — I10 Essential (primary) hypertension: Secondary | ICD-10-CM | POA: Diagnosis not present

## 2016-02-11 DIAGNOSIS — Z86010 Personal history of colonic polyps: Secondary | ICD-10-CM | POA: Diagnosis not present

## 2016-02-11 DIAGNOSIS — Z85118 Personal history of other malignant neoplasm of bronchus and lung: Secondary | ICD-10-CM | POA: Insufficient documentation

## 2016-02-11 DIAGNOSIS — K599 Functional intestinal disorder, unspecified: Secondary | ICD-10-CM | POA: Diagnosis not present

## 2016-02-11 HISTORY — PX: COLONOSCOPY: SHX5424

## 2016-02-11 SURGERY — COLONOSCOPY
Anesthesia: Monitor Anesthesia Care

## 2016-02-11 MED ORDER — MEPERIDINE HCL 100 MG/ML IJ SOLN: 6.2500 mg | INTRAMUSCULAR | Status: DC | PRN

## 2016-02-11 MED ORDER — MIDAZOLAM HCL 5 MG/5ML IJ SOLN
INTRAMUSCULAR | Status: DC | PRN
  Administered 2016-02-11 (×2): 1 mg via INTRAVENOUS
  Administered 2016-02-11: 2 mg via INTRAVENOUS

## 2016-02-11 MED ORDER — FENTANYL CITRATE (PF) 100 MCG/2ML IJ SOLN
INTRAMUSCULAR | Status: DC | PRN
  Administered 2016-02-11 (×2): 25 ug via INTRAVENOUS

## 2016-02-11 MED ORDER — MIDAZOLAM HCL 5 MG/ML IJ SOLN
INTRAMUSCULAR | Status: AC
  Filled 2016-02-11: qty 2

## 2016-02-11 MED ORDER — PROMETHAZINE HCL 25 MG/ML IJ SOLN: 6.2500 mg | INTRAMUSCULAR | Status: DC | PRN

## 2016-02-11 MED ORDER — SODIUM CHLORIDE 0.9 % IV SOLN
INTRAVENOUS | Status: DC
Start: 2016-02-11 — End: 2016-02-11

## 2016-02-11 MED ORDER — FENTANYL CITRATE (PF) 100 MCG/2ML IJ SOLN
INTRAMUSCULAR | Status: AC
  Filled 2016-02-11: qty 2

## 2016-02-11 MED ORDER — DIPHENHYDRAMINE HCL 50 MG/ML IJ SOLN
INTRAMUSCULAR | Status: AC
Start: 2016-02-11 — End: 2016-02-11
  Filled 2016-02-11: qty 1

## 2016-02-11 NOTE — Discharge Instructions (Signed)

## 2016-02-11 NOTE — Op Note (Signed)
[facility] Patient Name: Kimberly [name] Procedure Date: 02/11/2016 MRN: [id number] Attending MD: Carol Ada , MD Date of Birth: 05/15/1941 CSN: [id number] Age: 75 Admit Type: Outpatient Procedure:                Colonoscopy Indications:              Chronic diarrhea Providers:                Carol Ada, MD, Carolynn Comment, RN, Cherylynn Ridges, Technician Referring MD:              Medicines:                Fentanyl 50 micrograms IV, Midazolam 5 mg IV Complications:            No immediate complications. Estimated Blood Loss:     Estimated blood loss was minimal. Procedure:                Pre-Anesthesia Assessment:                           - Prior to the procedure, a History and Physical                            was performed, and patient medications and                            allergies were reviewed. The patient's tolerance of                            previous anesthesia was also reviewed. The risks                            and benefits of the procedure and the sedation                            options and risks were discussed with the patient.                            All questions were answered, and informed consent                            was obtained. Prior Anticoagulants: The patient has                            taken no previous anticoagulant or antiplatelet                            agents. ASA Grade Assessment: III - A patient with                            severe systemic disease. After reviewing the risks  and benefits, the patient was deemed in                            satisfactory condition to undergo the procedure.                           - Sedation was administered by an endoscopy nurse.                            The sedation level attained was moderate.                           After obtaining informed consent, the colonoscope                            was passed  under direct vision. Throughout the                            procedure, the patient's blood pressure, pulse, and                            oxygen saturations were monitored continuously. The                            EC-[id number] ([id number]) scope was introduced through                            the anus and advanced to the the cecum, identified                            by appendiceal orifice and ileocecal valve. The                            colonoscopy was performed without difficulty. The                            patient tolerated the procedure well. The quality                            of the bowel preparation was good. The ileocecal                            valve, appendiceal orifice, and rectum were                            photographed. Scope In: 8:51:59 AM Scope Out: 9:19:24 AM Scope Withdrawal Time: 0 hours 20 minutes 0 seconds  Total Procedure Duration: 0 hours 27 minutes 25 seconds  Findings:      Normal mucosa was found in the entire colon. Biopsies for histology were       taken with a cold forceps from the entire colon for evaluation of       microscopic colitis.      Scattered medium-mouthed diverticula were found in the sigmoid colon.      Extensive washing  was required in the cecum and the ascending colon to       obtain good views of the lumen. Impression:               - Normal mucosa in the entire examined colon.                            Biopsied.                           - Diverticulosis in the sigmoid colon. Moderate Sedation:      Moderate (conscious) sedation was administered by the endoscopy nurse       and supervised by the endoscopist. The following parameters were       monitored: oxygen saturation, heart rate, blood pressure, and response       to care. Recommendation:           - Patient has a contact number available for                            emergencies. The signs and symptoms of potential                            delayed  complications were discussed with the                            patient. Return to normal activities tomorrow.                            Written discharge instructions were provided to the                            patient.                           - Resume previous diet.                           - Continue present medications.                           - Await pathology results.                           - No repeat colonoscopy due to age and the absence                            of colonic polyps. Procedure Code(s):        --- Professional ---                           [phone], Colonoscopy, flexible; with biopsy, single                            or multiple Diagnosis Code(s):        --- Professional ---  K52.9, Noninfective gastroenteritis and colitis,                            unspecified                           K57.30, Diverticulosis of large intestine without                            perforation or abscess without bleeding CPT copyright 2016 American [employer] Association. All rights reserved. The codes documented in this report are preliminary and upon coder review may  be revised to meet current compliance requirements. Carol Ada, MD Carol Ada, MD 02/11/2016 9:27:39 AM This report has been signed electronically. Number of Addenda: 0

## 2016-02-14 ENCOUNTER — Encounter (HOSPITAL_COMMUNITY): Payer: Self-pay | Admitting: Gastroenterology

## 2016-03-01 DIAGNOSIS — E039 Hypothyroidism, unspecified: Secondary | ICD-10-CM | POA: Diagnosis not present

## 2016-05-03 ENCOUNTER — Encounter: Payer: Self-pay | Admitting: Internal Medicine

## 2016-05-03 ENCOUNTER — Ambulatory Visit (INDEPENDENT_AMBULATORY_CARE_PROVIDER_SITE_OTHER): Payer: Medicare Other | Admitting: Internal Medicine

## 2016-05-03 VITALS — BP 138/78 | HR 82 | Ht <= 58 in | Wt 166.0 lb

## 2016-05-03 DIAGNOSIS — R5383 Other fatigue: Secondary | ICD-10-CM

## 2016-05-03 DIAGNOSIS — I480 Paroxysmal atrial fibrillation: Secondary | ICD-10-CM

## 2016-05-03 DIAGNOSIS — Z789 Other specified health status: Secondary | ICD-10-CM | POA: Insufficient documentation

## 2016-05-03 DIAGNOSIS — Z7289 Other problems related to lifestyle: Secondary | ICD-10-CM | POA: Diagnosis not present

## 2016-05-03 DIAGNOSIS — R0602 Shortness of breath: Secondary | ICD-10-CM

## 2016-05-03 NOTE — Patient Instructions (Addendum)
Your physician has requested that you have a lexiscan myoview. For further information please visit [email or web address]. Please follow instruction sheet, as given.  Your physician recommends that you schedule a follow-up appointment with Dr. Hilty after your stress test.   

## 2016-05-03 NOTE — Progress Notes (Signed)
OFFICE NOTE  Chief Complaint:   Routine follow-up  Primary Care Physician: [name], MD  HPI:  Kimberly [name] is a 75 year old female who recently has been having some chest discomfort and shortness of breath as well as 2 episodes of syncope, both of which were during stressful situations, once while on a ladder. We went ahead and checked an echocardiogram which essentially shows only mild abnormalities. She did have a loudly calcified aortic valve which was not stenotic; however, there was mild regurgitation. There was mild LVH with EF 55% to 60% and grade 1 diastolic dysfunction. She had borderline pulmonary hypertension. A monitor was also worn by her from September 13, 2012 to October 12, 2012. This showed a predominantly sinus rhythm with PACs. There was, however, 1 episode of a nonsustained ventricular tachycardia which was about 5 beats. Of course she potentially would have to had longer episodes of NSVT this could have been playing a role in her syncopal event. I do not think she, however, is at high risk for lethal sustained VT given her normal ejection fraction and no clear evidence of ischemia. As you recall, she had a low-risk MET-test on April 02, 2012, with a peak VO2 of 77% predicted without any evidence of an ischemic response. This would make it quite unlikely that she has underlying coronary blockage that is responsible for her episode. I have, however, recommended adding a beta blocker to her regimen which will additionally help control her recent uncontrolled hypertension. This is Toprol XL 12.5 mg daily. It should help also with the VT. she is continuing to take low-dose Toprol and has had no further episodes of passing out in the past 6 months. She was thoroughly evaluated by a neurologist and felt to possibly be having a vasodepressive syncope. There was lower extremity.swelling and compression stockings were recommended.  Kimberly [name] returns today for followup.  She has been hospitalized a number of times since getting a diagnosis of lung cancer. 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 Unfortunately she developed A. fib with RVR and was treated and placed on amiodarone. She's been having problems with recurrent vasovagal syncope and hypotension. She does have a mass that is pressing against the atrium.  I saw Kimberly [name] back in the office today. She is undergoing chemotherapy which is cause some mild shrinkage of her tumor however it is not disappeared, and apparently is incurable and unresectable. Fortunate she's had no recurrence of her A. fib and remains on low-dose amiodarone. She says that she feels better than she had when she was undergoing radiation chemotherapy. She has no new complaints.  Kimberly [name] returns today for follow-up. She seems to be holding her own with regards for lung cancer. She has another 4 month reprieve to see her oncologist. Her most recent scan showed a slight increase in the size of her cancer with another small tumor but she seems to be taking this pretty well. She's not had recurrent A. fib from what I can tell and maintains on low-dose amiodarone. She is due for repeat lipid testing as well as surveillance thyroid testing.   Kimberly [name] returns today for follow-up. She denies any chest pain although does get short of breath with some activities. She reports that her lung cancer is still growing at a slow rate however  She was intolerant of extensive chemotherapy and is now on a chemotherapy break. 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 Since she's not had A. Fib in some time I thought it would be reasonable to take her off the medicine to see if there would be any recurrence. She's had some vision loss unrelated to amiodarone and we did not like to see  that adding to it.  05/03/2016  Kimberly [name] returns today for follow-up. She says that recently she's continued to be fatigued. She does not relate this to her recent lung cancer and chemotherapy. She stopped taking atorvastatin due to muscle aches and reports some improvement and no symptoms. She is concerned about cardiac causes of her symptoms including coronary artery disease. Blood pressure appears well controlled today. EKG shows a sinus rhythm with poor anterior R-wave progression and 82.  PMHx:  Past Medical History:  Diagnosis Date  . Anxiety    due to surgery   . Aortic insufficiency   . Arthritis   . Atrial fibrillation ([place])   . Bronchitis   . Cough    dry, endobronchial mass  . Dyslipidemia   . GERD (gastroesophageal reflux disease)   . Heart murmur   . History of nuclear stress test 02/26/2006   exercise myoview; normal pattern of perfusion; low risk scan   . Hypertension   . Hypothyroidism   . Mitral insufficiency   . Pneumonia    pus bronchitis  . PONV (postoperative nausea and vomiting)   . Radiation 12/01/13-01/08/14   50.4 gray to right central chest  . Shortness of breath    with exertion  . Squamous cell carcinoma of lung ([place])   . Syncope    "states she has passed out a few times. dr is trying to find cause.last time when geeting ready to go home after video bronch/bx  . Thyroid disease     Past Surgical History:  Procedure Laterality Date  . CARDIAC CATHETERIZATION  07/08/2008   normal coronaries  . CARPAL TUNNEL RELEASE Right 1988  . COLONOSCOPY N/A 02/11/2016   Procedure: COLONOSCOPY;  Surgeon: Carol Ada, MD;  Location: [facility];  Service: Endoscopy;  Laterality: N/A;  . DILATION AND CURETTAGE OF UTERUS    . EYE SURGERY Bilateral   . H/O MET Test w/PFT  04/02/2012   low risk; peak VO2 77% predicted  . JOINT REPLACEMENT  2003   thumb rt  . KNEE ARTHROSCOPY  12   rt meniscus  . MEDIASTINOSCOPY N/A 10/30/2013   Procedure: MEDIASTINOSCOPY;  Surgeon:  Melrose Nakayama, MD;  Location: [place];  Service: Thoracic;  Laterality: N/A;  . ROTATOR CUFF REPAIR  2006   ? side  . THYROIDECTOMY  1973  . TRANSTHORACIC ECHOCARDIOGRAM  09/25/2012   EF 55-60%; mild LVH & mild concentric hypertrophy; mild AV regurg; RV systolic pressure increase consistent with mild pulm HTN  . VIDEO ASSISTED THORACOSCOPY (VATS)/ LOBECTOMY Right 11/13/2013   Procedure: VIDEO ASSISTED THORACOSCOPY (VATS) with mediastinal  biopsies;  Surgeon: Melrose Nakayama, MD;  Location: [place];  Service: Thoracic;  Laterality: Right;  RIGHT VATS,mediastinal biopsies  . VIDEO BRONCHOSCOPY Bilateral 09/25/2013   Procedure: VIDEO BRONCHOSCOPY WITHOUT FLUORO;  Surgeon: Tanda Rockers, MD;  Location: [facility];  Service: Cardiopulmonary;  Laterality: Bilateral;  . VIDEO BRONCHOSCOPY WITH ENDOBRONCHIAL ULTRASOUND N/A 10/30/2013   Procedure: VIDEO BRONCHOSCOPY WITH ENDOBRONCHIAL ULTRASOUND;  Surgeon: Melrose Nakayama, MD;  Location: [place];  Service: Thoracic;  Laterality: N/A;    FAMHx:  Family History  Problem Relation Age of Onset  . Lung cancer Mother   . Heart attack Father   . Heart failure Maternal Grandfather   . Heart attack Paternal Grandfather     SOCHx:   reports that she quit smoking about 48 years ago. Her smoking use included Cigarettes. She has a 3.00 pack-year smoking history. She has never used smokeless tobacco. She reports that she drinks alcohol. She reports that she does not use drugs.  ALLERGIES:  Allergies  Allergen Reactions  . Amiodarone Other (See Comments)    Corneal deposits  . Milk-Related Compounds Other (See Comments)    GI upset, projectile vomiting - can't take any dairy products    ROS: Pertinent items noted in HPI and remainder of comprehensive ROS otherwise negative.  HOME MEDS: Current Outpatient Prescriptions  Medication Sig Dispense Refill  . albuterol (PROVENTIL HFA;VENTOLIN HFA) 108 (90 BASE) MCG/ACT inhaler Inhale 1 puff into  the lungs every 6 (six) hours as needed for wheezing or shortness of breath.     [name] aspirin EC 81 MG tablet Take 81 mg by mouth daily.    [name] atorvastatin (LIPITOR) 20 MG tablet Take 20 mg by mouth every morning.     . Calcium Carb-Cholecalciferol (CALCIUM 600 + D PO) Take 1 tablet by mouth daily.    . chlorthalidone (HYGROTON) 25 MG tablet Take 1 tablet (25 mg total) by mouth daily. 90 tablet 3  . Cholecalciferol (VITAMIN D3) 1000 units CAPS Take 1,000 Units by mouth daily.    [name] levothyroxine (SYNTHROID, LEVOTHROID) 150 MCG tablet Take 150 mcg by mouth at bedtime.    . metoprolol succinate (TOPROL-XL) 25 MG 24 hr tablet Take one-half tablet by  mouth daily 45 tablet 3  . montelukast (SINGULAIR) 10 MG tablet Take 10 mg by mouth at bedtime.     . Multiple Vitamins-Iron (MULTIVITAMIN/IRON PO) Take 1 tablet by mouth daily.      No current facility-administered medications for this visit.     LABS/IMAGING: No results found for this or any previous visit (from the past 48 hour(s)). No results found.  VITALS: BP 138/78   Pulse 82   Ht _0  (1.473 m)   Wt 166 lb (75.3 kg)   BMI 34.69 kg/m   EXAM: GEN: Awake, nad HEENT: PERRLA, EOMI LUNGS: Clear bilaterally CV: RRR, s1/s2, no MRG's ABD: S/NT, +BS EXT: No edema NEURO: A&O x 3, grossly non-focal SKIN: DRY, warm, no rashes PSYCH: Mood, affect normal  EKG: Normal sinus rhythm at 82, poor R-wave progression  ASSESSMENT: 1. Progressive fatigue and dyspnea 2. Lung cancer (non-small cell stage III) 3. Recurrent vasodepressor syncope  4. Lower extremity edema 5. Hypertension - blood pressure now elevated 6. Paroxysmal atrial fibrillation  PLAN: 1.   Kimberly [name] has described progressive fatigue and dyspnea which she feels is out of proportion for her lung cancer. We last did stress testing which was a cardio metabolic stress test in 1031 and was normal. This is a low sensitivity test and if she were to have some coronary disease may  have missed it or it could've progressed since then. I like for her to have a Lexiscan Myoview to evaluate for any possible ischemia that could explain her symptoms.  Follow-up with me afterwards to discuss.  Pixie Casino, MD, [facility] Attending Cardiologist [place] 05/03/2016, 5:42 PM

## 2016-05-11 ENCOUNTER — Telehealth (HOSPITAL_COMMUNITY): Payer: Self-pay

## 2016-05-11 NOTE — Telephone Encounter (Signed)
Encounter complete. 

## 2016-05-16 ENCOUNTER — Ambulatory Visit (HOSPITAL_COMMUNITY)
Admission: RE | Admit: 2016-05-16 | Discharge: 2016-05-16 | Disposition: A | Discharge status: Home / Self Care | Payer: Medicare Other | Source: Ambulatory Visit | Attending: Internal Medicine | Admitting: Internal Medicine

## 2016-05-16 DIAGNOSIS — R0609 Other forms of dyspnea: Secondary | ICD-10-CM | POA: Insufficient documentation

## 2016-05-16 DIAGNOSIS — Z7289 Other problems related to lifestyle: Secondary | ICD-10-CM | POA: Diagnosis not present

## 2016-05-16 DIAGNOSIS — E663 Overweight: Secondary | ICD-10-CM | POA: Insufficient documentation

## 2016-05-16 DIAGNOSIS — R0602 Shortness of breath: Secondary | ICD-10-CM

## 2016-05-16 DIAGNOSIS — I10 Essential (primary) hypertension: Secondary | ICD-10-CM | POA: Diagnosis not present

## 2016-05-16 DIAGNOSIS — Z8249 Family history of ischemic heart disease and other diseases of the circulatory system: Secondary | ICD-10-CM | POA: Diagnosis not present

## 2016-05-16 DIAGNOSIS — R079 Chest pain, unspecified: Secondary | ICD-10-CM | POA: Diagnosis not present

## 2016-05-16 DIAGNOSIS — Z789 Other specified health status: Secondary | ICD-10-CM

## 2016-05-16 DIAGNOSIS — R5383 Other fatigue: Secondary | ICD-10-CM

## 2016-05-16 DIAGNOSIS — Z6834 Body mass index (BMI) 34.0-34.9, adult: Secondary | ICD-10-CM | POA: Diagnosis not present

## 2016-05-16 LAB — MYOCARDIAL PERFUSION IMAGING
CHL CUP NUCLEAR SDS: 0
CHL CUP RESTING HR STRESS: 82 {beats}/min
LVDIAVOL: 59 mL (ref 46–106)
LVSYSVOL: 24 mL
NUC STRESS TID: 1.15
Peak HR: 98 {beats}/min
SRS: 0
SSS: 0

## 2016-05-16 MED ORDER — TECHNETIUM TC 99M TETROFOSMIN IV KIT
31.9000 | PACK | Freq: Once | INTRAVENOUS | Status: AC | PRN
  Administered 2016-05-16: 31.9 via INTRAVENOUS
  Filled 2016-05-16: qty 32

## 2016-05-16 MED ORDER — TECHNETIUM TC 99M TETROFOSMIN IV KIT
10.7000 | PACK | Freq: Once | INTRAVENOUS | Status: AC | PRN
  Administered 2016-05-16: 10.7 via INTRAVENOUS
  Filled 2016-05-16: qty 11

## 2016-05-16 MED ORDER — REGADENOSON 0.4 MG/5ML IV SOLN
0.4000 mg | Freq: Once | INTRAVENOUS | Status: AC
  Administered 2016-05-16: 0.4 mg via INTRAVENOUS

## 2016-05-16 MED ORDER — AMINOPHYLLINE 25 MG/ML IV SOLN
75.0000 mg | Freq: Once | INTRAVENOUS | Status: AC
  Administered 2016-05-16: 75 mg via INTRAVENOUS

## 2016-05-23 DIAGNOSIS — E039 Hypothyroidism, unspecified: Secondary | ICD-10-CM | POA: Diagnosis not present

## 2016-06-05 ENCOUNTER — Encounter: Payer: Self-pay | Admitting: Internal Medicine

## 2016-06-05 ENCOUNTER — Ambulatory Visit (INDEPENDENT_AMBULATORY_CARE_PROVIDER_SITE_OTHER): Payer: Medicare Other | Admitting: Internal Medicine

## 2016-06-05 VITALS — BP 158/96 | HR 89 | Ht 58.5 in | Wt 167.8 lb

## 2016-06-05 DIAGNOSIS — C342 Malignant neoplasm of middle lobe, bronchus or lung: Secondary | ICD-10-CM | POA: Diagnosis not present

## 2016-06-05 DIAGNOSIS — R5383 Other fatigue: Secondary | ICD-10-CM | POA: Diagnosis not present

## 2016-06-05 DIAGNOSIS — I4891 Unspecified atrial fibrillation: Secondary | ICD-10-CM | POA: Diagnosis not present

## 2016-06-05 DIAGNOSIS — R0602 Shortness of breath: Secondary | ICD-10-CM | POA: Diagnosis not present

## 2016-06-05 NOTE — Patient Instructions (Signed)
Medication Instructions:  Your physician recommends that you continue on your current medications as directed. Please refer to the Current Medication list given to you today.  Labwork: None   Testing/Procedures: None   Follow-Up: Your physician wants you to follow-up in: 6 months with Dr Debara Pickett. You will receive a reminder letter in the mail two months in advance. If you don't receive a letter, please call our office to schedule the follow-up appointment.  Any Other Special Instructions Will Be Listed Below (If Applicable).     If you need a refill on your cardiac medications before your next appointment, please call your pharmacy.

## 2016-06-05 NOTE — Progress Notes (Signed)
OFFICE NOTE  Chief Complaint:  Follow-up stress test  Primary Care Physician: [name], MD  HPI:  Kimberly [name] is a 75 year old female who recently has been having some chest discomfort and shortness of breath as well as 2 episodes of syncope, both of which were during stressful situations, once while on a ladder. We went ahead and checked an echocardiogram which essentially shows only mild abnormalities. She did have a loudly calcified aortic valve which was not stenotic; however, there was mild regurgitation. There was mild LVH with EF 55% to 60% and grade 1 diastolic dysfunction. She had borderline pulmonary hypertension. A monitor was also worn by her from September 13, 2012 to October 12, 2012. This showed a predominantly sinus rhythm with PACs. There was, however, 1 episode of a nonsustained ventricular tachycardia which was about 5 beats. Of course she potentially would have to had longer episodes of NSVT this could have been playing a role in her syncopal event. I do not think she, however, is at high risk for lethal sustained VT given her normal ejection fraction and no clear evidence of ischemia. As you recall, she had a low-risk MET-test on April 02, 2012, with a peak VO2 of 77% predicted without any evidence of an ischemic response. This would make it quite unlikely that she has underlying coronary blockage that is responsible for her episode. I have, however, recommended adding a beta blocker to her regimen which will additionally help control her recent uncontrolled hypertension. This is Toprol XL 12.5 mg daily. It should help also with the VT. she is continuing to take low-dose Toprol and has had no further episodes of passing out in the past 6 months. She was thoroughly evaluated by a neurologist and felt to possibly be having a vasodepressive syncope. There was lower extremity.swelling and compression stockings were recommended.  Kimberly [name] returns today for followup.  She has been hospitalized a number of times since getting a diagnosis of lung cancer. 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 Unfortunately she developed A. fib with RVR and was treated and placed on amiodarone. She's been having problems with recurrent vasovagal syncope and hypotension. She does have a mass that is pressing against the atrium.  I saw Kimberly [name] back in the office today. She is undergoing chemotherapy which is cause some mild shrinkage of her tumor however it is not disappeared, and apparently is incurable and unresectable. Fortunate she's had no recurrence of her A. fib and remains on low-dose amiodarone. She says that she feels better than she had when she was undergoing radiation chemotherapy. She has no new complaints.  Kimberly [name] returns today for follow-up. She seems to be holding her own with regards for lung cancer. She has another 4 month reprieve to see her oncologist. Her most recent scan showed a slight increase in the size of her cancer with another small tumor but she seems to be taking this pretty well. She's not had recurrent A. fib from what I can tell and maintains on low-dose amiodarone. She is due for repeat lipid testing as well as surveillance thyroid testing.   Kimberly [name] returns today for follow-up. She denies any chest pain although does get short of breath with some activities. She reports that her lung cancer is still growing at a slow rate however  She was intolerant of extensive chemotherapy and is now on a chemotherapy break. 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 Since she's not had A. Fib in some time I thought it would be reasonable to take her off the medicine to see if there would be any recurrence. She's had some vision loss unrelated to amiodarone and we did not like to see  that adding to it.  05/03/2016  Kimberly [name] returns today for follow-up. She says that recently she's continued to be fatigued. She does not relate this to her recent lung cancer and chemotherapy. She stopped taking atorvastatin due to muscle aches and reports some improvement and no symptoms. She is concerned about cardiac causes of her symptoms including coronary artery disease. Blood pressure appears well controlled today. EKG shows a sinus rhythm with poor anterior R-wave progression and 82.  06/05/2016  Kimberly [name] was seen today in follow-up. She underwent nuclear stress testing which was negative for ischemia and showed normal LV function. I reassured her that I did not feel that her fatigue is related to worsening coronary artery disease.  PMHx:  Past Medical History:  Diagnosis Date  . Anxiety    due to surgery   . Aortic insufficiency   . Arthritis   . Atrial fibrillation ([place])   . Bronchitis   . Cough    dry, endobronchial mass  . Dyslipidemia   . GERD (gastroesophageal reflux disease)   . Heart murmur   . History of nuclear stress test 02/26/2006   exercise myoview; normal pattern of perfusion; low risk scan   . Hypertension   . Hypothyroidism   . Mitral insufficiency   . Pneumonia    pus bronchitis  . PONV (postoperative nausea and vomiting)   . Radiation 12/01/13-01/08/14   50.4 gray to right central chest  . Shortness of breath    with exertion  . Squamous cell carcinoma of lung ([place])   . Syncope    "states she has passed out a few times. dr is trying to find cause.last time when geeting ready to go home after video bronch/bx  . Thyroid disease     Past Surgical History:  Procedure Laterality Date  . CARDIAC CATHETERIZATION  07/08/2008   normal coronaries  . CARPAL TUNNEL RELEASE Right 1988  . COLONOSCOPY N/A 02/11/2016   Procedure: COLONOSCOPY;  Surgeon: Carol Ada, MD;  Location: [facility];  Service: Endoscopy;  Laterality: N/A;  . DILATION AND CURETTAGE OF UTERUS      . EYE SURGERY Bilateral   . H/O MET Test w/PFT  04/02/2012   low risk; peak VO2 77% predicted  . JOINT REPLACEMENT  2003   thumb rt  . KNEE ARTHROSCOPY  12   rt meniscus  . MEDIASTINOSCOPY N/A 10/30/2013   Procedure: MEDIASTINOSCOPY;  Surgeon: Melrose Nakayama, MD;  Location: [place];  Service: Thoracic;  Laterality: N/A;  . ROTATOR CUFF REPAIR  2006   ? side  . THYROIDECTOMY  1973  . TRANSTHORACIC ECHOCARDIOGRAM  09/25/2012   EF 55-60%; mild LVH & mild concentric hypertrophy; mild AV regurg; RV systolic pressure increase consistent with mild pulm HTN  . VIDEO ASSISTED THORACOSCOPY (VATS)/ LOBECTOMY Right 11/13/2013   Procedure: VIDEO ASSISTED THORACOSCOPY (VATS) with mediastinal  biopsies;  Surgeon: Melrose Nakayama, MD;  Location: [facility];  Service: Thoracic;  Laterality: Right;  RIGHT VATS,mediastinal biopsies  . VIDEO BRONCHOSCOPY Bilateral 09/25/2013   Procedure: VIDEO BRONCHOSCOPY WITHOUT FLUORO;  Surgeon: Tanda Rockers, MD;  Location: [facility];  Service: Cardiopulmonary;  Laterality: Bilateral;  . VIDEO BRONCHOSCOPY WITH ENDOBRONCHIAL ULTRASOUND N/A 10/30/2013   Procedure: VIDEO BRONCHOSCOPY WITH ENDOBRONCHIAL ULTRASOUND;  Surgeon: Melrose Nakayama, MD;  Location: [facility] OR;  Service: Thoracic;  Laterality: N/A;    FAMHx:  Family History  Problem Relation Age of Onset  . Lung cancer Mother   . Heart attack Father   . Heart failure Maternal Grandfather   . Heart attack Paternal Grandfather     SOCHx:   reports that she quit smoking about 48 years ago. Her smoking use included Cigarettes. She has a 3.00 pack-year smoking history. She has never used smokeless tobacco. She reports that she drinks alcohol. She reports that she does not use drugs.  ALLERGIES:  Allergies  Allergen Reactions  . Amiodarone Other (See Comments)    Corneal deposits  . Milk-Related Compounds Other (See Comments)    GI upset, projectile vomiting - can't take any dairy products     ROS: Pertinent items noted in HPI and remainder of comprehensive ROS otherwise negative.  HOME MEDS: Current Outpatient Prescriptions  Medication Sig Dispense Refill  . albuterol (PROVENTIL HFA;VENTOLIN HFA) 108 (90 BASE) MCG/ACT inhaler Inhale 1 puff into the lungs every 6 (six) hours as needed for wheezing or shortness of breath.     [name] aspirin EC 81 MG tablet Take 81 mg by mouth daily.    [name] atorvastatin (LIPITOR) 20 MG tablet Take 20 mg by mouth every morning.     . Calcium Carb-Cholecalciferol (CALCIUM 600 + D PO) Take 1 tablet by mouth daily.    . chlorthalidone (HYGROTON) 25 MG tablet Take 1 tablet (25 mg total) by mouth daily. 90 tablet 3  . Cholecalciferol (VITAMIN D3) 1000 units CAPS Take 1,000 Units by mouth daily.    [name] levothyroxine (SYNTHROID, LEVOTHROID) 150 MCG tablet Take 150 mcg by mouth at bedtime.    . metoprolol succinate (TOPROL-XL) 25 MG 24 hr tablet Take one-half tablet by  mouth daily 45 tablet 3  . montelukast (SINGULAIR) 10 MG tablet Take 10 mg by mouth at bedtime.     . Multiple Vitamins-Iron (MULTIVITAMIN/IRON PO) Take 1 tablet by mouth daily.      No current facility-administered medications for this visit.     LABS/IMAGING: No results found for this or any previous visit (from the past 48 hour(s)). No results found.  VITALS: BP (!) 158/96   Pulse 89   Ht 4' 10.5" (1.486 m)   Wt 167 lb 12.8 oz (76.1 kg)   BMI 34.47 kg/m   EXAM: Deferred  EKG: Deferred  ASSESSMENT: 1. Progressive fatigue and dyspnea - low risk myoview stress test (EF 60%) - 05/2016 2. Lung cancer (non-small cell stage III) 3. Recurrent vasodepressor syncope  4. Lower extremity edema 5. Hypertension - blood pressure now elevated 6. Paroxysmal atrial fibrillation  PLAN: 1.   Mrs. Pokorney had a low risk Myoview which is reassuring. I do not feel that her fatigue and dyspnea is related to this however it may be a new normal after her lung cancer. Otherwise she seems to be doing  well and I've encouraged her to try to get as much activity as possible other she has chronic back problems which limit her mobility. Follow-up with me in 6 months or sooner as necessary.  Pixie Casino, MD, [facility] Attending Cardiologist [place] 06/05/2016, 12:57 PM

## 2016-06-13 DIAGNOSIS — Z23 Encounter for immunization: Secondary | ICD-10-CM | POA: Diagnosis not present

## 2016-07-05 DIAGNOSIS — R05 Cough: Secondary | ICD-10-CM | POA: Diagnosis not present

## 2016-07-05 DIAGNOSIS — J069 Acute upper respiratory infection, unspecified: Secondary | ICD-10-CM | POA: Diagnosis not present

## 2016-07-07 DIAGNOSIS — J700 Acute pulmonary manifestations due to radiation: Secondary | ICD-10-CM | POA: Diagnosis not present

## 2016-07-07 DIAGNOSIS — Z85118 Personal history of other malignant neoplasm of bronchus and lung: Secondary | ICD-10-CM | POA: Diagnosis not present

## 2016-07-07 DIAGNOSIS — J9801 Acute bronchospasm: Secondary | ICD-10-CM | POA: Diagnosis not present

## 2016-07-07 DIAGNOSIS — J40 Bronchitis, not specified as acute or chronic: Secondary | ICD-10-CM | POA: Diagnosis not present

## 2016-07-10 ENCOUNTER — Encounter (HOSPITAL_COMMUNITY): Payer: Self-pay

## 2016-07-10 ENCOUNTER — Ambulatory Visit (HOSPITAL_COMMUNITY)
Admission: RE | Admit: 2016-07-10 | Discharge: 2016-07-10 | Disposition: A | Discharge status: Home / Self Care | Payer: Medicare Other | Source: Ambulatory Visit | Attending: Internal Medicine | Admitting: Internal Medicine

## 2016-07-10 ENCOUNTER — Other Ambulatory Visit (HOSPITAL_BASED_OUTPATIENT_CLINIC_OR_DEPARTMENT_OTHER): Payer: Medicare Other

## 2016-07-10 DIAGNOSIS — C342 Malignant neoplasm of middle lobe, bronchus or lung: Secondary | ICD-10-CM | POA: Diagnosis not present

## 2016-07-10 DIAGNOSIS — I700 Atherosclerosis of aorta: Secondary | ICD-10-CM | POA: Diagnosis not present

## 2016-07-10 DIAGNOSIS — C3490 Malignant neoplasm of unspecified part of unspecified bronchus or lung: Secondary | ICD-10-CM | POA: Diagnosis not present

## 2016-07-10 LAB — CBC WITH DIFFERENTIAL/PLATELET
BASO%: 0.1 % (ref 0.0–2.0)
Basophils Absolute: 0 10*3/uL (ref 0.0–0.1)
EOS%: 0.2 % (ref 0.0–7.0)
Eosinophils Absolute: 0 10*3/uL (ref 0.0–0.5)
HCT: 34.7 % — ABNORMAL LOW (ref 34.8–46.6)
HGB: 11.8 g/dL (ref 11.6–15.9)
LYMPH%: 16.8 % (ref 14.0–49.7)
MCH: 31.6 pg (ref 25.1–34.0)
MCHC: 34 g/dL (ref 31.5–36.0)
MCV: 93 fL (ref 79.5–101.0)
MONO#: 1.3 10*3/uL — ABNORMAL HIGH (ref 0.1–0.9)
MONO%: 10.7 % (ref 0.0–14.0)
NEUT%: 72.2 % (ref 38.4–76.8)
NEUTROS ABS: 8.9 10*3/uL — AB (ref 1.5–6.5)
Platelets: 306 10*3/uL (ref 145–400)
RBC: 3.73 10*6/uL (ref 3.70–5.45)
RDW: 13.1 % (ref 11.2–14.5)
WBC: 12.4 10*3/uL — AB (ref 3.9–10.3)
lymph#: 2.1 10*3/uL (ref 0.9–3.3)

## 2016-07-10 LAB — COMPREHENSIVE METABOLIC PANEL
ALT: 18 U/L (ref 0–55)
AST: 19 U/L (ref 5–34)
Albumin: 3.5 g/dL (ref 3.5–5.0)
Alkaline Phosphatase: 97 U/L (ref 40–150)
Anion Gap: 9 mEq/L (ref 3–11)
BILIRUBIN TOTAL: 0.37 mg/dL (ref 0.20–1.20)
BUN: 27.8 mg/dL — ABNORMAL HIGH (ref 7.0–26.0)
CO2: 31 meq/L — AB (ref 22–29)
Calcium: 10.1 mg/dL (ref 8.4–10.4)
Chloride: 99 mEq/L (ref 98–109)
Creatinine: 1.1 mg/dL (ref 0.6–1.1)
EGFR: 48 mL/min/{1.73_m2} — AB (ref >90)
GLUCOSE: 86 mg/dL (ref 70–140)
Potassium: 4.1 mEq/L (ref 3.5–5.1)
SODIUM: 139 meq/L (ref 136–145)
TOTAL PROTEIN: 7.1 g/dL (ref 6.4–8.3)

## 2016-07-10 MED ORDER — IOPAMIDOL (ISOVUE-300) INJECTION 61%
75.0000 mL | Freq: Once | INTRAVENOUS | Status: AC | PRN
  Administered 2016-07-10: 75 mL via INTRAVENOUS

## 2016-07-14 DIAGNOSIS — J40 Bronchitis, not specified as acute or chronic: Secondary | ICD-10-CM | POA: Diagnosis not present

## 2016-07-17 ENCOUNTER — Encounter: Payer: Self-pay | Admitting: Internal Medicine

## 2016-07-17 ENCOUNTER — Ambulatory Visit (HOSPITAL_BASED_OUTPATIENT_CLINIC_OR_DEPARTMENT_OTHER): Payer: Medicare Other | Admitting: Internal Medicine

## 2016-07-17 ENCOUNTER — Telehealth: Payer: Self-pay | Admitting: Internal Medicine

## 2016-07-17 VITALS — BP 171/73 | HR 93 | Temp 98.2°F | Resp 18 | Ht 58.5 in | Wt 166.5 lb

## 2016-07-17 DIAGNOSIS — C342 Malignant neoplasm of middle lobe, bronchus or lung: Secondary | ICD-10-CM

## 2016-07-17 DIAGNOSIS — M545 Low back pain: Secondary | ICD-10-CM | POA: Diagnosis not present

## 2016-07-17 NOTE — Progress Notes (Signed)
[place] [phone]   [phone]  OFFICE NOTE  Kimberly Pel, MD [place] [address]  DIAGNOSIS: Unresectable, likely a stage IIIA (T2a, N2, M0) non-small cell lung cancer, squamous cell carcinoma diagnosed in February 2015.  PRIOR THERAPY:  1) On 11/13/2013 the patient underwent exploratory right VATS with mediastinal biopsy under the care of Dr. Roxan Hockey. 2) Concurrent chemoradiation with weekly carboplatin for AUC of 2 and paclitaxel 45 mg/M2, status post 6 cycles with partial response. First cycle was given on 12/01/2013. 3) Consolidation chemotherapy with carboplatin for AUC of 5 and paclitaxel 175 mg/M2 every 3 weeks with Neulasta support. First dose 02/23/2014. Status post 3 cycles with partial response.   CURRENT THERAPY: Observation.  INTERVAL HISTORY: Kimberly [name] 75 y.o. female returns to the clinic today for followup visit. Over the last few months the patient did not feel well and she has been complaining of shortness of breath as well as cough. She also has persistent low back pain and she had MRI of the lumbar spine performed on 12/07/2015 that showed moderately severe spinal stenosis at L3-4 and L4-5. She is followed by orthopedic surgery. She was also treated for urinary tract infection. She denied having any significant no fever or chills. She denied having any chest pain or hemoptysis. No significant weight loss or night sweats. She had repeat CT scan of the chest performed recently and she is here for evaluation and discussion of her scan results.  MEDICAL HISTORY: Past Medical History:  Diagnosis Date  . Anxiety    due to surgery   . Aortic insufficiency   . Arthritis   . Atrial fibrillation ([place])   . Bronchitis   . Cough    dry, endobronchial mass  . Dyslipidemia   . GERD (gastroesophageal reflux disease)   . Heart murmur   . History of nuclear stress test 02/26/2006   exercise myoview; normal pattern of perfusion; low risk scan   . Hypertension   . Hypothyroidism   . Mitral insufficiency   . Pneumonia    pus bronchitis  . PONV (postoperative nausea and vomiting)   . Radiation 12/01/13-01/08/14   50.4 gray to right central chest  . Shortness of breath    with exertion  . Squamous cell carcinoma of lung ([place])   . Syncope    "states she has passed out a few times. dr is trying to find cause.last time when geeting ready to go home after video bronch/bx  . Thyroid disease     ALLERGIES:  is allergic to amiodarone and milk-related compounds.  MEDICATIONS:  Current Outpatient Prescriptions  Medication Sig Dispense Refill  . albuterol (PROVENTIL HFA;VENTOLIN HFA) 108 (90 BASE) MCG/ACT inhaler Inhale 1 puff into the lungs every 6 (six) hours as needed for wheezing or shortness of breath.     [name] aspirin EC 81 MG tablet Take 81 mg by mouth daily.    [name] atorvastatin (LIPITOR) 20 MG tablet Take 20 mg by mouth every morning.     . Calcium Carb-Cholecalciferol (CALCIUM 600 + D PO) Take 1 tablet by mouth daily.    . chlorthalidone (HYGROTON) 25 MG tablet Take 1 tablet (25 mg total) by mouth daily. 90 tablet 3  . Cholecalciferol (VITAMIN D3) 1000 units CAPS Take 1,000 Units by mouth daily.    [name] levothyroxine (SYNTHROID, LEVOTHROID) 150 MCG tablet Take 150 mcg by mouth at bedtime.    . metoprolol succinate (  TOPROL-XL) 25 MG 24 hr tablet Take one-half tablet by  mouth daily 45 tablet 3  . montelukast (SINGULAIR) 10 MG tablet Take 10 mg by mouth at bedtime.     . Multiple Vitamins-Iron (MULTIVITAMIN/IRON PO) Take 1 tablet by mouth daily.      No current facility-administered medications for this visit.     SURGICAL HISTORY:  Past Surgical History:  Procedure Laterality Date  . CARDIAC CATHETERIZATION  07/08/2008   normal coronaries  . CARPAL TUNNEL RELEASE Right 1988  . COLONOSCOPY N/A 02/11/2016   Procedure: COLONOSCOPY;  Surgeon: Carol Ada, MD;  [facility];  Service: Endoscopy;  Laterality: N/A;  . DILATION AND CURETTAGE OF UTERUS    . EYE SURGERY Bilateral   . H/O MET Test w/PFT  04/02/2012   low risk; peak VO2 77% predicted  . JOINT REPLACEMENT  2003   thumb rt  . KNEE ARTHROSCOPY  12   rt meniscus  . MEDIASTINOSCOPY N/A 10/30/2013   Procedure: MEDIASTINOSCOPY;  Surgeon: Melrose Nakayama, MD;  Location: [facility];  Service: Thoracic;  Laterality: N/A;  . ROTATOR CUFF REPAIR  2006   ? side  . THYROIDECTOMY  1973  . TRANSTHORACIC ECHOCARDIOGRAM  09/25/2012   EF 55-60%; mild LVH & mild concentric hypertrophy; mild AV regurg; RV systolic pressure increase consistent with mild pulm HTN  . VIDEO ASSISTED THORACOSCOPY (VATS)/ LOBECTOMY Right 11/13/2013   Procedure: VIDEO ASSISTED THORACOSCOPY (VATS) with mediastinal  biopsies;  Surgeon: Melrose Nakayama, MD;  Location: [place] [facility];  Service: Thoracic;  Laterality: Right;  RIGHT VATS,mediastinal biopsies  . VIDEO BRONCHOSCOPY Bilateral 09/25/2013   Procedure: VIDEO BRONCHOSCOPY WITHOUT FLUORO;  Surgeon: Tanda Rockers, MD;  [facility];  Service: Cardiopulmonary;  Laterality: Bilateral;  . VIDEO BRONCHOSCOPY WITH ENDOBRONCHIAL ULTRASOUND N/A 10/30/2013   Procedure: VIDEO BRONCHOSCOPY WITH ENDOBRONCHIAL ULTRASOUND;  Surgeon: Melrose Nakayama, MD;  Location: [place] [facility];  Service: Thoracic;  Laterality: N/A;    REVIEW OF SYSTEMS:  A comprehensive review of systems was negative except for: Constitutional: positive for fatigue Respiratory: positive for cough and dyspnea on exertion Musculoskeletal: positive for back pain   PHYSICAL EXAMINATION: General appearance: alert, cooperative, fatigued and no distress Head: Normocephalic, without obvious abnormality, atraumatic Neck: no adenopathy, no JVD, supple, symmetrical, trachea midline and thyroid not enlarged, symmetric, no tenderness/mass/nodules Lymph nodes: Cervical, supraclavicular, and axillary nodes normal. Resp: clear to  auscultation bilaterally Back: symmetric, no curvature. ROM normal. No CVA tenderness. Cardio: regular rate and rhythm, S1, S2 normal, no murmur, click, rub or gallop GI: soft, non-tender; bowel sounds normal; no masses,  no organomegaly Extremities: extremities normal, atraumatic, no cyanosis or edema Neurologic: Alert and oriented X 3, normal strength and tone. Normal symmetric reflexes. Normal coordination and gait  ECOG PERFORMANCE STATUS: 1 - Symptomatic but completely ambulatory  Blood pressure (!) 171/73, pulse 93, temperature 98.2 F (36.8 C), temperature source Oral, resp. rate 18, height 4' 10.5" (1.486 m), weight 166 lb 8 oz (75.5 kg), SpO2 100 %.  LABORATORY DATA: Lab Results  Component Value Date   WBC 12.4 (H) 07/10/2016   HGB 11.8 07/10/2016   HCT 34.7 (L) 07/10/2016   MCV 93.0 07/10/2016   PLT 306 07/10/2016      Chemistry      Component Value Date/Time   NA 139 07/10/2016 1210   K 4.1 07/10/2016 1210   CL 96 04/13/2014 0814   CO2 31 (H) 07/10/2016 1210   BUN 27.8 (H) 07/10/2016 1210  CREATININE 1.1 07/10/2016 1210      Component Value Date/Time   CALCIUM 10.1 07/10/2016 1210   ALKPHOS 97 07/10/2016 1210   AST 19 07/10/2016 1210   ALT 18 07/10/2016 1210   BILITOT 0.37 07/10/2016 1210       RADIOGRAPHIC STUDIES: Ct Chest W Contrast  Result Date: 07/10/2016 CLINICAL DATA:  Lung cancer restaging. History of right non-small-cell lung cancer. EXAM: CT CHEST WITH CONTRAST TECHNIQUE: Multidetector CT imaging of the chest was performed during intravenous contrast administration. CONTRAST:  9m ISOVUE-300 IOPAMIDOL (ISOVUE-300) INJECTION 61% COMPARISON:  01/06/2016. FINDINGS: Cardiovascular: The heart size is normal. No pericardial effusion. Coronary artery calcification is noted. Atherosclerotic calcification is noted in the wall of the thoracic aorta. Mediastinum/Nodes: Interval progression of the abnormal soft tissue along the inferior aspect of the right  mainstem bronchus and abutting the posterior aspect of the right main pulmonary artery. This measures 2.7 x 1.7 cm today compared to 1.3 x 1.1 cm when I remeasure at the same location on the previous exam. Imaging features today suggest an enlarged subcarinal lymph node. The rim of soft tissue encasing the right mainstem bronchus also appears progressed compared image 48 series 2 today to image 33 series 2 previously. The esophagus has normal imaging features. There is no axillary lymphadenopathy. Lungs/Pleura: Right parahilar post radiation fibrosis is stable. Stable appearance volume loss right hemi thorax. No new or progressive pulmonary nodule or mass. Calcified granulomata again noted left lower lobe. Upper Abdomen: Unremarkable. Musculoskeletal: Bone windows reveal no worrisome lytic or sclerotic osseous lesions. IMPRESSION: 1. Interval progression of abnormal subcarinal soft tissue, highly suspicious for disease progression. Soft tissue encasing the right mainstem bronchus also appears progressed in the interval. 2. Stable appearance right parahilar radiation fibrosis. 3. Coronary artery and thoracic aortic atherosclerosis. Electronically Signed   By: EMisty StanleyM.D.   On: 07/10/2016 16:43   ASSESSMENT AND PLAN:  unresectable stage IIIA non-small cell lung cancer:  This is a very pleasant 75years old white female with unresectable stage IIIA non-small cell lung cancer completed a course of concurrent chemoradiation with weekly carboplatin and paclitaxel status post 6 cycles.  This is followed by 3 cycles of consolidation chemotherapy with reduced dose carboplatin and paclitaxel. She has been observation for more than 2 years. Unfortunately the recent CT scan of the chest showed concerning findings for disease progression involving subcarinal lymph nodes as well as soft tissue encasing the right mainstem bronchus. I discussed the scan results with the patient and her daughter was available by  phone. I recommended for her to have a PET scan performed next week for further evaluation of her disease and to rule out disease recurrence. I would see the patient back for follow-up visit in 2 weeks for discussion of her PET scan results and further recommendation regarding treatment of her condition. For the low back pain, she will continue her current treatment by orthopedic surgery.                                                                                    She was advised to call immediately if she has any concerning symptoms in the  interval. The patient voices understanding of current disease status and treatment options and is in agreement with the current care plan.  All questions were answered. The patient knows to call the clinic with any problems, questions or concerns. We can certainly see the patient much sooner if necessary.  Disclaimer: This note was dictated with voice recognition software. Similar sounding words can inadvertently be transcribed and may not be corrected upon review.

## 2016-07-17 NOTE — Telephone Encounter (Signed)
Appointments scheduled per 11/13 LOS. Patient given AVS report and calendars with future scheduled appointments.

## 2016-07-25 ENCOUNTER — Ambulatory Visit (HOSPITAL_COMMUNITY)
Admission: RE | Admit: 2016-07-25 | Discharge: 2016-07-25 | Disposition: A | Discharge status: Home / Self Care | Payer: Medicare Other | Source: Ambulatory Visit | Attending: Internal Medicine | Admitting: Internal Medicine

## 2016-07-25 DIAGNOSIS — K449 Diaphragmatic hernia without obstruction or gangrene: Secondary | ICD-10-CM | POA: Diagnosis not present

## 2016-07-25 DIAGNOSIS — J701 Chronic and other pulmonary manifestations due to radiation: Secondary | ICD-10-CM | POA: Diagnosis not present

## 2016-07-25 DIAGNOSIS — M899 Disorder of bone, unspecified: Secondary | ICD-10-CM | POA: Insufficient documentation

## 2016-07-25 DIAGNOSIS — C3431 Malignant neoplasm of lower lobe, right bronchus or lung: Secondary | ICD-10-CM | POA: Diagnosis not present

## 2016-07-25 DIAGNOSIS — I700 Atherosclerosis of aorta: Secondary | ICD-10-CM | POA: Diagnosis not present

## 2016-07-25 DIAGNOSIS — I2510 Atherosclerotic heart disease of native coronary artery without angina pectoris: Secondary | ICD-10-CM | POA: Insufficient documentation

## 2016-07-25 DIAGNOSIS — C342 Malignant neoplasm of middle lobe, bronchus or lung: Secondary | ICD-10-CM | POA: Insufficient documentation

## 2016-07-25 LAB — GLUCOSE, CAPILLARY: Glucose-Capillary: 94 mg/dL (ref 65–99)

## 2016-07-25 MED ORDER — FLUDEOXYGLUCOSE F - 18 (FDG) INJECTION
8.1000 | Freq: Once | INTRAVENOUS | Status: AC | PRN
  Administered 2016-07-25: 8.1 via INTRAVENOUS

## 2016-08-03 ENCOUNTER — Ambulatory Visit (HOSPITAL_BASED_OUTPATIENT_CLINIC_OR_DEPARTMENT_OTHER): Payer: Medicare Other | Admitting: Internal Medicine

## 2016-08-03 ENCOUNTER — Telehealth: Payer: Self-pay | Admitting: *Deleted

## 2016-08-03 ENCOUNTER — Telehealth: Payer: Self-pay | Admitting: Internal Medicine

## 2016-08-03 ENCOUNTER — Encounter: Payer: Self-pay | Admitting: Internal Medicine

## 2016-08-03 VITALS — BP 179/78 | HR 88 | Temp 98.4°F | Resp 18 | Ht 58.5 in | Wt 165.1 lb

## 2016-08-03 DIAGNOSIS — M545 Low back pain: Secondary | ICD-10-CM

## 2016-08-03 DIAGNOSIS — I10 Essential (primary) hypertension: Secondary | ICD-10-CM

## 2016-08-03 DIAGNOSIS — C342 Malignant neoplasm of middle lobe, bronchus or lung: Secondary | ICD-10-CM | POA: Diagnosis not present

## 2016-08-03 NOTE — Telephone Encounter (Signed)
Per LOS I have scheduled appt and notified the scheduleer

## 2016-08-03 NOTE — Telephone Encounter (Signed)
Appointments scheduled per 11/30 LOS. Patient given AVS report and calendars with future scheduled appointments. Patient aware of MRI appointments.

## 2016-08-03 NOTE — Progress Notes (Signed)
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  PRIOR THERAPY:  1) Status post exploratory right VATS with mediastinal biopsy under the care of Dr. Roxan Hockey on 11/13/2013. The tumor was found to be unresectable at that time.[name] 2) Concurrent chemoradiation with weekly carboplatin for AUC of 2 and paclitaxel 45 mg/M2, status post 6 cycles with partial response. First cycle was given on 12/01/2013. 3) Consolidation chemotherapy with carboplatin for AUC of 5 and paclitaxel 175 mg/M2 every 3 weeks with Neulasta support. First dose 02/23/2014. Status post 3 cycles with partial response.   CURRENT THERAPY: None.  INTERVAL HISTORY: Kimberly [name] 75 y.o. female with history of stage IIIa non-small cell lung cancer status post concurrent chemoradiation with weekly carboplatin and paclitaxel followed by consolidation chemotherapy with 3 cycles of carboplatin and paclitaxel with partial response. She has been on observation for the last 2 years with no concerning findings for disease recurrence until recently. She came to the clinic today accompanied by her daughter. The patient has increasing fatigue and weakness. She also has increasing shortness of breath. She denied having any nausea or vomiting. She has no fever or chills. She denied having any significant chest pain, cough or hemoptysis. She lost few pounds over the last few weeks. She also reported low back pain started few weeks ago. She is very anxious today. Her blood pressure is elevated because of her anxiety and concern about her scan results. She was found on recent CT scan of the chest to have concerning finding in the mediastinum for disease recurrence. I ordered a PET scan which  was performed recently and she is here for evaluation and discussion of the PET scan results.  MEDICAL HISTORY: Past Medical History:  Diagnosis Date  . Anxiety    due to surgery   . Aortic insufficiency   . Arthritis   . Atrial fibrillation ([place])   . Bronchitis   . Cough    dry, endobronchial mass  . Dyslipidemia   . GERD (gastroesophageal reflux disease)   . Heart murmur   . History of nuclear stress test 02/26/2006   exercise myoview; normal pattern of perfusion; low risk scan   . Hypertension   . Hypothyroidism   . Mitral insufficiency   . Pneumonia    pus bronchitis  . PONV (postoperative nausea and vomiting)   . Radiation 12/01/13-01/08/14   50.4 gray to right central chest  . Shortness of breath    with exertion  . Squamous cell carcinoma of lung ([place])   . Syncope    "states she has passed out a few times. dr is trying to find cause.last time when geeting ready to go home after video bronch/bx  . Thyroid disease     ALLERGIES:  is allergic to amiodarone and milk-related compounds.  MEDICATIONS:  Current Outpatient Prescriptions  Medication Sig Dispense Refill  . albuterol (PROVENTIL HFA;VENTOLIN HFA) 108 (90 BASE) MCG/ACT inhaler Inhale 1 puff into the lungs every 6 (six) hours as needed for wheezing or shortness of breath.     [name] aspirin EC 81 MG tablet Take 81 mg by mouth daily.    [name] atorvastatin (LIPITOR) 20 MG tablet Take 20 mg by mouth every morning.     . Calcium Carb-Cholecalciferol (CALCIUM  600 + D PO) Take 1 tablet by mouth daily.    . chlorthalidone (HYGROTON) 25 MG tablet Take 1 tablet (25 mg total) by mouth daily. 90 tablet 3  . Cholecalciferol (VITAMIN D3) 1000 units CAPS Take 1,000 Units by mouth daily.    [name] levothyroxine (SYNTHROID, LEVOTHROID) 150 MCG tablet Take 150 mcg by mouth at bedtime.    . meloxicam (MOBIC) 7.5 MG tablet Take 7.5 mg by mouth 2 (two) times daily as needed for pain.    . methocarbamol (ROBAXIN) 500 MG tablet Take 500 mg by mouth  every 8 (eight) hours as needed for muscle spasms.    . metoprolol succinate (TOPROL-XL) 25 MG 24 hr tablet Take one-half tablet by  mouth daily 45 tablet 3  . montelukast (SINGULAIR) 10 MG tablet Take 10 mg by mouth at bedtime.     . Multiple Vitamins-Iron (MULTIVITAMIN/IRON PO) Take 1 tablet by mouth daily.     . traMADol (ULTRAM) 50 MG tablet Take by mouth every 8 (eight) hours as needed.     No current facility-administered medications for this visit.     SURGICAL HISTORY:  Past Surgical History:  Procedure Laterality Date  . CARDIAC CATHETERIZATION  07/08/2008   normal coronaries  . CARPAL TUNNEL RELEASE Right 1988  . COLONOSCOPY N/A 02/11/2016   Procedure: COLONOSCOPY;  Surgeon: Carol Ada, MD;  Location: [facility];  Service: Endoscopy;  Laterality: N/A;  . DILATION AND CURETTAGE OF UTERUS    . EYE SURGERY Bilateral   . H/O MET Test w/PFT  04/02/2012   low risk; peak VO2 77% predicted  . JOINT REPLACEMENT  2003   thumb rt  . KNEE ARTHROSCOPY  12   rt meniscus  . MEDIASTINOSCOPY N/A 10/30/2013   Procedure: MEDIASTINOSCOPY;  Surgeon: Melrose Nakayama, MD;  Location: [facility];  Service: Thoracic;  Laterality: N/A;  . ROTATOR CUFF REPAIR  2006   ? side  . THYROIDECTOMY  1973  . TRANSTHORACIC ECHOCARDIOGRAM  09/25/2012   EF 55-60%; mild LVH & mild concentric hypertrophy; mild AV regurg; RV systolic pressure increase consistent with mild pulm HTN  . VIDEO ASSISTED THORACOSCOPY (VATS)/ LOBECTOMY Right 11/13/2013   Procedure: VIDEO ASSISTED THORACOSCOPY (VATS) with mediastinal  biopsies;  Surgeon: Melrose Nakayama, MD;  Location: [facility];  Service: Thoracic;  Laterality: Right;  RIGHT VATS,mediastinal biopsies  . VIDEO BRONCHOSCOPY Bilateral 09/25/2013   Procedure: VIDEO BRONCHOSCOPY WITHOUT FLUORO;  Surgeon: Tanda Rockers, MD;  Location: [facility];  Service: Cardiopulmonary;  Laterality: Bilateral;  . VIDEO BRONCHOSCOPY WITH ENDOBRONCHIAL ULTRASOUND N/A 10/30/2013   Procedure:  VIDEO BRONCHOSCOPY WITH ENDOBRONCHIAL ULTRASOUND;  Surgeon: Melrose Nakayama, MD;  Location: [facility];  Service: Thoracic;  Laterality: N/A;    REVIEW OF SYSTEMS:  Constitutional: positive for anorexia, fatigue and weight loss Eyes: negative for icterus, irritation and redness Ears, nose, mouth, throat, and face: negative for epistaxis, hoarseness, sore mouth and sore throat Respiratory: positive for dyspnea on exertion and wheezing Cardiovascular: negative for chest pain, irregular heart beat, orthopnea and palpitations Gastrointestinal: negative for constipation, diarrhea, dysphagia, nausea and vomiting Genitourinary:negative for dysuria, frequency and hematuria Integument/breast: negative Hematologic/lymphatic: negative Musculoskeletal:positive for arthralgias, back pain and bone pain Neurological: negative Behavioral/Psych: negative Endocrine: negative Allergic/Immunologic: negative   PHYSICAL EXAMINATION: General appearance: alert, cooperative, appears stated age, fatigued, no distress and mildly obese Head: Normocephalic, without obvious abnormality Neck: no adenopathy and supple, symmetrical, trachea midline Lymph nodes: Cervical, supraclavicular, and axillary nodes normal. Resp: wheezes bilaterally Back:  negative, no kyphosis present, no scoliosis present, range of motion normal Cardio: regular rate and rhythm and S1, S2 normal GI: soft, non-tender; bowel sounds normal; no masses,  no organomegaly Extremities: extremities normal, atraumatic, no cyanosis or edema Neurologic: Grossly normal  ECOG PERFORMANCE STATUS: 1 - Symptomatic but completely ambulatory  Blood pressure (!) 179/78, pulse 88, temperature 98.4 F (36.9 C), resp. rate 18, height 4' 10.5" (1.486 m), weight 165 lb 1.6 oz (74.9 kg), SpO2 98 %.  LABORATORY DATA: Lab Results  Component Value Date   WBC 12.4 (H) 07/10/2016   HGB 11.8 07/10/2016   HCT 34.7 (L) 07/10/2016   MCV 93.0 07/10/2016   PLT 306  07/10/2016      Chemistry      Component Value Date/Time   NA 139 07/10/2016 1210   K 4.1 07/10/2016 1210   CL 96 04/13/2014 0814   CO2 31 (H) 07/10/2016 1210   BUN 27.8 (H) 07/10/2016 1210   CREATININE 1.1 07/10/2016 1210      Component Value Date/Time   CALCIUM 10.1 07/10/2016 1210   ALKPHOS 97 07/10/2016 1210   AST 19 07/10/2016 1210   ALT 18 07/10/2016 1210   BILITOT 0.37 07/10/2016 1210       RADIOGRAPHIC STUDIES: Ct Chest W Contrast  Result Date: 07/10/2016 CLINICAL DATA:  Lung cancer restaging. History of right non-small-cell lung cancer. EXAM: CT CHEST WITH CONTRAST TECHNIQUE: Multidetector CT imaging of the chest was performed during intravenous contrast administration. CONTRAST:  37m ISOVUE-300 IOPAMIDOL (ISOVUE-300) INJECTION 61% COMPARISON:  01/06/2016. FINDINGS: Cardiovascular: The heart size is normal. No pericardial effusion. Coronary artery calcification is noted. Atherosclerotic calcification is noted in the wall of the thoracic aorta. Mediastinum/Nodes: Interval progression of the abnormal soft tissue along the inferior aspect of the right mainstem bronchus and abutting the posterior aspect of the right main pulmonary artery. This measures 2.7 x 1.7 cm today compared to 1.3 x 1.1 cm when I remeasure at the same location on the previous exam. Imaging features today suggest an enlarged subcarinal lymph node. The rim of soft tissue encasing the right mainstem bronchus also appears progressed compared image 48 series 2 today to image 33 series 2 previously. The esophagus has normal imaging features. There is no axillary lymphadenopathy. Lungs/Pleura: Right parahilar post radiation fibrosis is stable. Stable appearance volume loss right hemi thorax. No new or progressive pulmonary nodule or mass. Calcified granulomata again noted left lower lobe. Upper Abdomen: Unremarkable. Musculoskeletal: Bone windows reveal no worrisome lytic or sclerotic osseous lesions. IMPRESSION: 1.  Interval progression of abnormal subcarinal soft tissue, highly suspicious for disease progression. Soft tissue encasing the right mainstem bronchus also appears progressed in the interval. 2. Stable appearance right parahilar radiation fibrosis. 3. Coronary artery and thoracic aortic atherosclerosis. Electronically Signed   By: EMisty StanleyM.D.   On: 07/10/2016 16:43   Nm Pet Image Restag (ps) Skull Base To Thigh  Result Date: 07/25/2016 CLINICAL DATA:  Subsequent treatment strategy for right lower lobe lung cancer. Enlarging mediastinal nodes. EXAM: NUCLEAR MEDICINE PET SKULL BASE TO THIGH TECHNIQUE: 8.1 mCi F-18 FDG was injected intravenously. Full-ring PET imaging was performed from the skull base to thigh after the radiotracer. CT data was obtained and used for attenuation correction and anatomic localization. FASTING BLOOD GLUCOSE:  Value: 94 mg/dl COMPARISON:  Chest CT 07/10/2016.  Prior PET of 10/15/2013. FINDINGS: NECK No areas of abnormal hypermetabolism. No cervical adenopathy. Surgical changes of at least partial thyroidectomy. CHEST Hypermetabolism corresponding to the  enlarging subcarinal soft tissue. This measures 2.1 cm and a S.U.V. max of 15.0 on image 60/series 4. There is low-level hypermetabolism within right middle lobe consolidation which is likely radiation induced. A more focal area of hypermetabolism identified within measures a S.U.V. max of 8.9 on approximately image 30/series 7. No well-defined mass in this area. Distal esophageal hypermetabolism is without CT correlate and measures a S.U.V. max of 6.9 on image 80/series 4. Other chest findings deferred to recent diagnostic CT. Multivessel coronary artery atherosclerosis. Trace right pleural thickening. Right lower lobe radiation fibrosis as well. Calcified left lower lobe granuloma. ABDOMEN/PELVIS No abdominal pelvic nodal or parenchymal hypermetabolism. Tiny hiatal hernia. Aortic and branch vessel atherosclerosis. SKELETON New  hypermetabolism centered about the posterior elements at L3. This measures a S.U.V. max of 7.3. No well-defined CT correlate. New muscular hypermetabolism about the posterior aspect of the left scapula. This measures a S.U.V. max of 5.6, including on image 51/series 4. No CT correlate today or on the prior diagnostic CT. Lipomas identified posterior to the left scapula and within the right thigh. IMPRESSION: 1. Hypermetabolism corresponding to subcarinal nodal tissue, consistent with metastatic disease. 2. Right middle lobe radiation fibrosis with a focus of hypermetabolism within, favored to be inflammatory. Recommend attention on follow-up. 3. Hypermetabolism about the posterior elements of L3, without definite CT correlate. Suspicious for metastatic disease. Degenerative etiology felt less likely. Consider dedicated pre and post contrast lumbar spine MRI. A muscular focus of hypermetabolism posterior to the left scapula is without CT correlate and indeterminate. Especially if the L3 hypermetabolism is confirmed to be an osseous metastasis, muscular metastasis is suspected. 4. Small hiatal hernia with likely physiologic or inflammatory distal esophageal hypermetabolism. 5.  Coronary artery atherosclerosis. Aortic atherosclerosis. Electronically Signed   By: Abigail Miyamoto M.D.   On: 07/25/2016 10:36   ASSESSMENT AND PLAN:  This is a very pleasant 75 years old white female with: 1) recurrent non-small cell lung cancer initially diagnosed as unresectable stage IIIA squamous cell carcinoma status post concurrent chemoradiation followed by consolidation chemotherapy. She has been observation for the last 2 years and no concerning findings for disease recurrence until recently. Her recent CT scan of the chest showed interval progression of abnormal subcarinal soft tissue mass concerning for disease recurrence. I ordered a PET scan which was performed recently. I personally reviewed the PET scan and CT scan images  and discussed it with the patient and her daughter and showed them the images. Unfortunately the PET scan showed hyper metabolic activity in the subcarinal node tissue consistent with metastatic disease. There was also some concerning finding in the posterior element of L3 suspicious for metastatic disease and a muscular focus of hypermetabolism to the left scapula. I had a lengthy discussion with the patient and her daughter about these findings and further recommendation regarding treatment of her condition. This is a very complicated case especially with recurrence in the previously treated area was concurrent chemoradiation. I gave the patient the option of palliative care versus consideration of systemic chemotherapy with carboplatin for AUC of 5 and paclitaxel 175 MG/M2 every 3 weeks with Neulasta support. I discussed with the patient and her daughter the adverse effect of this treatment including but not limited to alopecia, myelosuppression, nausea and vomiting, peripheral neuropathy, liver or renal dysfunction. If she fails the first line systemic chemotherapy, I may consider the patient for treatment with immunotherapy. The patient would like to start the treatment as soon as possible. She is expected to  start the first dose of this treatment on 08/14/2016. She will need a carboplatin test dose before every cycle of chemotherapy because of the previous treatment with this regimen. 2) back pain: There is a concerning findings on the PET scan at L3. This is suspicious for metastatic disease. I will order MRI of the lumbar spine to evaluate this lesion. She will take tramadol for back pain for now. 3) poorly controlled hypertension: The patient is very anxious today and her blood pressure is always high when she comes to the office. I strongly advised her to monitor her blood pressure closely at home and if she has persistent elevation of her pressure to consult with her primary care physician for  adjustment of her medication. She is currently on metoprolol 4) medication refill: I will call her pharmacy was prescription for Compazine 10 mg by mouth every 6 hours to be used as needed for nausea with her chemotherapy. 5) follow-up visit: The patient would come back for follow-up visit on 08/21/2016 for evaluation and management of any adverse effect of her treatment. She was advised to call immediately if she has any concerning symptoms in the interval.  The patient voices understanding of current disease status and treatment options and is in agreement with the current care plan.  All questions were answered. The patient knows to call the clinic with any problems, questions or concerns. We can certainly see the patient much sooner if necessary.  Disclaimer: This note was dictated with voice recognition software. Similar sounding words can inadvertently be transcribed and may not be corrected upon review.

## 2016-08-10 ENCOUNTER — Ambulatory Visit (HOSPITAL_COMMUNITY): Admission: RE | Admit: 2016-08-10 | Payer: Medicare Other | Source: Ambulatory Visit

## 2016-08-10 ENCOUNTER — Ambulatory Visit (HOSPITAL_COMMUNITY): Payer: Medicare Other

## 2016-08-10 ENCOUNTER — Ambulatory Visit (HOSPITAL_COMMUNITY)
Admission: RE | Admit: 2016-08-10 | Discharge: 2016-08-10 | Disposition: A | Discharge status: Home / Self Care | Payer: Medicare Other | Source: Ambulatory Visit | Attending: Internal Medicine | Admitting: Internal Medicine

## 2016-08-10 DIAGNOSIS — R05 Cough: Secondary | ICD-10-CM | POA: Diagnosis not present

## 2016-08-10 DIAGNOSIS — C342 Malignant neoplasm of middle lobe, bronchus or lung: Secondary | ICD-10-CM | POA: Insufficient documentation

## 2016-08-10 DIAGNOSIS — M48061 Spinal stenosis, lumbar region without neurogenic claudication: Secondary | ICD-10-CM | POA: Insufficient documentation

## 2016-08-10 DIAGNOSIS — M7138 Other bursal cyst, other site: Secondary | ICD-10-CM | POA: Insufficient documentation

## 2016-08-10 DIAGNOSIS — C3490 Malignant neoplasm of unspecified part of unspecified bronchus or lung: Secondary | ICD-10-CM | POA: Diagnosis not present

## 2016-08-10 MED ORDER — GADOBENATE DIMEGLUMINE 529 MG/ML IV SOLN
15.0000 mL | Freq: Once | INTRAVENOUS | Status: AC | PRN
  Administered 2016-08-10: 15 mL via INTRAVENOUS

## 2016-08-11 ENCOUNTER — Telehealth: Payer: Self-pay | Admitting: *Deleted

## 2016-08-11 NOTE — Telephone Encounter (Signed)
Patient called and stated,"Dr. Deland Pretty diagnosed me with a bacterial sinus infection and fever blisters. I am taking antibiotics, Valacyclovir, probiotics and an inhaler. I wanted Dr. Julien Nordmann to know this because I am suppose to have chemotherapy on Monday. I instructed patient to keep all of her appointments on Monday with Dr. Julien Nordmann. Patient verbalized understanding.

## 2016-08-14 ENCOUNTER — Other Ambulatory Visit: Payer: Self-pay | Admitting: Medical Oncology

## 2016-08-14 ENCOUNTER — Other Ambulatory Visit: Payer: Self-pay | Admitting: Internal Medicine

## 2016-08-14 ENCOUNTER — Ambulatory Visit (HOSPITAL_BASED_OUTPATIENT_CLINIC_OR_DEPARTMENT_OTHER): Payer: Medicare Other

## 2016-08-14 ENCOUNTER — Other Ambulatory Visit (HOSPITAL_BASED_OUTPATIENT_CLINIC_OR_DEPARTMENT_OTHER): Payer: Medicare Other

## 2016-08-14 ENCOUNTER — Encounter: Payer: Self-pay | Admitting: Internal Medicine

## 2016-08-14 ENCOUNTER — Telehealth: Payer: Self-pay | Admitting: Internal Medicine

## 2016-08-14 ENCOUNTER — Ambulatory Visit (HOSPITAL_BASED_OUTPATIENT_CLINIC_OR_DEPARTMENT_OTHER): Payer: Medicare Other | Admitting: Internal Medicine

## 2016-08-14 VITALS — BP 130/86 | HR 77 | Temp 97.8°F | Resp 17

## 2016-08-14 VITALS — BP 157/68 | HR 86 | Temp 98.6°F | Resp 18 | Ht 58.5 in | Wt 165.0 lb

## 2016-08-14 DIAGNOSIS — C342 Malignant neoplasm of middle lobe, bronchus or lung: Secondary | ICD-10-CM | POA: Diagnosis not present

## 2016-08-14 DIAGNOSIS — G8929 Other chronic pain: Secondary | ICD-10-CM

## 2016-08-14 DIAGNOSIS — Z5111 Encounter for antineoplastic chemotherapy: Secondary | ICD-10-CM | POA: Insufficient documentation

## 2016-08-14 DIAGNOSIS — I878 Other specified disorders of veins: Secondary | ICD-10-CM

## 2016-08-14 DIAGNOSIS — M549 Dorsalgia, unspecified: Secondary | ICD-10-CM

## 2016-08-14 DIAGNOSIS — M545 Low back pain: Secondary | ICD-10-CM

## 2016-08-14 DIAGNOSIS — I10 Essential (primary) hypertension: Secondary | ICD-10-CM

## 2016-08-14 HISTORY — DX: Dorsalgia, unspecified: M54.9

## 2016-08-14 LAB — COMPREHENSIVE METABOLIC PANEL
ALT: 17 U/L (ref 0–55)
AST: 19 U/L (ref 5–34)
Albumin: 3.5 g/dL (ref 3.5–5.0)
Alkaline Phosphatase: 98 U/L (ref 40–150)
Anion Gap: 12 mEq/L — ABNORMAL HIGH (ref 3–11)
BILIRUBIN TOTAL: 0.51 mg/dL (ref 0.20–1.20)
BUN: 17.6 mg/dL (ref 7.0–26.0)
CO2: 26 mEq/L (ref 22–29)
CREATININE: 1 mg/dL (ref 0.6–1.1)
Calcium: 9.9 mg/dL (ref 8.4–10.4)
Chloride: 98 mEq/L (ref 98–109)
EGFR: 57 mL/min/{1.73_m2} — ABNORMAL LOW (ref >90)
GLUCOSE: 110 mg/dL (ref 70–140)
Potassium: 3.6 mEq/L (ref 3.5–5.1)
SODIUM: 136 meq/L (ref 136–145)
TOTAL PROTEIN: 7.1 g/dL (ref 6.4–8.3)

## 2016-08-14 LAB — CBC WITH DIFFERENTIAL/PLATELET
BASO%: 0.6 % (ref 0.0–2.0)
Basophils Absolute: 0.1 10*3/uL (ref 0.0–0.1)
EOS%: 1.6 % (ref 0.0–7.0)
Eosinophils Absolute: 0.2 10*3/uL (ref 0.0–0.5)
HCT: 35.7 % (ref 34.8–46.6)
HGB: 12 g/dL (ref 11.6–15.9)
LYMPH%: 12.9 % — ABNORMAL LOW (ref 14.0–49.7)
MCH: 31.4 pg (ref 25.1–34.0)
MCHC: 33.5 g/dL (ref 31.5–36.0)
MCV: 93.5 fL (ref 79.5–101.0)
MONO#: 0.7 10*3/uL (ref 0.1–0.9)
MONO%: 6.1 % (ref 0.0–14.0)
NEUT%: 78.8 % — ABNORMAL HIGH (ref 38.4–76.8)
NEUTROS ABS: 8.4 10*3/uL — AB (ref 1.5–6.5)
PLATELETS: 277 10*3/uL (ref 145–400)
RBC: 3.82 10*6/uL (ref 3.70–5.45)
RDW: 13.3 % (ref 11.2–14.5)
WBC: 10.6 10*3/uL — AB (ref 3.9–10.3)
lymph#: 1.4 10*3/uL (ref 0.9–3.3)

## 2016-08-14 MED ORDER — SODIUM CHLORIDE 0.9 % IV SOLN
421.0000 mg | Freq: Once | INTRAVENOUS | Status: AC
  Administered 2016-08-14: 420 mg via INTRAVENOUS
  Filled 2016-08-14: qty 42

## 2016-08-14 MED ORDER — PALONOSETRON HCL INJECTION 0.25 MG/5ML
INTRAVENOUS | Status: AC
  Filled 2016-08-14: qty 5

## 2016-08-14 MED ORDER — SODIUM CHLORIDE 0.9 % IV SOLN
20.0000 mg | Freq: Once | INTRAVENOUS | Status: AC
  Administered 2016-08-14: 20 mg via INTRAVENOUS
  Filled 2016-08-14: qty 2

## 2016-08-14 MED ORDER — FAMOTIDINE IN NACL 20-0.9 MG/50ML-% IV SOLN
20.0000 mg | Freq: Once | INTRAVENOUS | Status: AC
  Administered 2016-08-14: 20 mg via INTRAVENOUS

## 2016-08-14 MED ORDER — DIPHENHYDRAMINE HCL 50 MG/ML IJ SOLN
INTRAMUSCULAR | Status: AC
  Filled 2016-08-14: qty 1

## 2016-08-14 MED ORDER — SODIUM CHLORIDE 0.9 % IV SOLN
Freq: Once | INTRAVENOUS | Status: AC
  Administered 2016-08-14: 13:00:00 via INTRAVENOUS

## 2016-08-14 MED ORDER — FAMOTIDINE IN NACL 20-0.9 MG/50ML-% IV SOLN
INTRAVENOUS | Status: AC
  Filled 2016-08-14: qty 50

## 2016-08-14 MED ORDER — SODIUM CHLORIDE 0.9 % IV SOLN
170.0000 mg/m2 | Freq: Once | INTRAVENOUS | Status: AC
  Administered 2016-08-14: 300 mg via INTRAVENOUS
  Filled 2016-08-14: qty 50

## 2016-08-14 MED ORDER — DIPHENHYDRAMINE HCL 50 MG/ML IJ SOLN
50.0000 mg | Freq: Once | INTRAMUSCULAR | Status: AC
  Administered 2016-08-14: 50 mg via INTRAVENOUS

## 2016-08-14 MED ORDER — ONDANSETRON HCL 8 MG PO TABS: 8.0000 mg | ORAL_TABLET | Freq: Three times a day (TID) | ORAL | 0 refills | Status: DC | PRN

## 2016-08-14 MED ORDER — PALONOSETRON HCL INJECTION 0.25 MG/5ML
0.2500 mg | Freq: Once | INTRAVENOUS | Status: AC
  Administered 2016-08-14: 0.25 mg via INTRAVENOUS

## 2016-08-14 MED ORDER — PROCHLORPERAZINE MALEATE 10 MG PO TABS: 10.0000 mg | ORAL_TABLET | Freq: Four times a day (QID) | ORAL | 1 refills | Status: DC | PRN

## 2016-08-14 NOTE — Telephone Encounter (Signed)
Message sent to chemo scheduler to be added per 08/14/16. Per patient, she will not be available, week of 08/29/16 for labs, as per she will be out of town. No labs was scheduled for week of 08/29/16. Appointments scheduled per 08/14/16 los. A copy of the appointment schedule was given to the patient, per 08/14/16 los.

## 2016-08-14 NOTE — Progress Notes (Signed)
Taxol infusion ran over 1hr today (see eMAR) Kimberly Lesser NP and pharmacy aware. Patient tolerated infusion well with no complaints.  Pt and VS remain stable, pt educated on hypersensitivity protocol in the event a reaction occurs after discharge per Kimberly Lesser NP. Pt verbalizes understanding. Pt and VS stable at discharge.

## 2016-08-14 NOTE — Patient Instructions (Addendum)
[facility] Discharge Instructions for Patients Receiving Chemotherapy  Today you received the following chemotherapy agents Taxol and Carboplatin  To help prevent nausea and vomiting after your treatment, we encourage you to take your nausea medication as directed. No Zofran for 3 days. Take Compazine instead.   If you develop nausea and vomiting that is not controlled by your nausea medication, call the clinic.   BELOW ARE SYMPTOMS THAT SHOULD BE REPORTED IMMEDIATELY:  *FEVER GREATER THAN 100.5 F  *CHILLS WITH OR WITHOUT FEVER  NAUSEA AND VOMITING THAT IS NOT CONTROLLED WITH YOUR NAUSEA MEDICATION  *UNUSUAL SHORTNESS OF BREATH  *UNUSUAL BRUISING OR BLEEDING  TENDERNESS IN MOUTH AND THROAT WITH OR WITHOUT PRESENCE OF ULCERS  *URINARY PROBLEMS  *BOWEL PROBLEMS  UNUSUAL RASH Items with * indicate a potential emergency and should be followed up as soon as possible.  Feel free to call the clinic you have any questions or concerns. The clinic phone number is [phone].  Please show the [facility] at check-in to the Emergency Department and triage nurse.  Paclitaxel injection What is this medicine? PACLITAXEL (PAK li TAX el) is a chemotherapy drug. It targets fast dividing cells, like cancer cells, and causes these cells to die. This medicine is used to treat ovarian cancer, breast cancer, and other cancers. This medicine may be used for other purposes; ask your health care provider or pharmacist if you have questions. COMMON BRAND NAME(S): Onxol, Taxol What should I tell my health care provider before I take this medicine? They need to know if you have any of these conditions: -blood disorders -irregular heartbeat -infection (especially a virus infection such as chickenpox, cold sores, or herpes) -liver disease -previous or ongoing radiation therapy -an unusual or allergic reaction to paclitaxel, alcohol, polyoxyethylated castor oil, other  chemotherapy agents, other medicines, foods, dyes, or preservatives -pregnant or trying to get pregnant -breast-feeding How should I use this medicine? This drug is given as an infusion into a vein. It is administered in a hospital or clinic by a specially trained health care professional. Talk to your pediatrician regarding the use of this medicine in children. Special care may be needed. Overdosage: If you think you have taken too much of this medicine contact a poison control center or emergency room at once. NOTE: This medicine is only for you. Do not share this medicine with others. What if I miss a dose? It is important not to miss your dose. Call your doctor or health care professional if you are unable to keep an appointment. What may interact with this medicine? Do not take this medicine with any of the following medications: -disulfiram -metronidazole This medicine may also interact with the following medications: -cyclosporine -diazepam -ketoconazole -medicines to increase blood counts like filgrastim, pegfilgrastim, sargramostim -other chemotherapy drugs like cisplatin, doxorubicin, epirubicin, etoposide, teniposide, vincristine -quinidine -testosterone -vaccines -verapamil Talk to your doctor or health care professional before taking any of these medicines: -acetaminophen -aspirin -ibuprofen -ketoprofen -naproxen This list may not describe all possible interactions. Give your health care provider a list of all the medicines, herbs, non-prescription drugs, or dietary supplements you use. Also tell them if you smoke, drink alcohol, or use illegal drugs. Some items may interact with your medicine. What should I watch for while using this medicine? Your condition will be monitored carefully while you are receiving this medicine. You will need important blood work done while you are taking this medicine. This medicine can cause serious allergic reactions.  To reduce your risk  you will need to take other medicine(s) before treatment with this medicine. If you experience allergic reactions like skin rash, itching or hives, swelling of the face, lips, or tongue, tell your doctor or health care professional right away. In some cases, you may be given additional medicines to help with side effects. Follow all directions for their use. This drug may make you feel generally unwell. This is not uncommon, as chemotherapy can affect healthy cells as well as cancer cells. Report any side effects. Continue your course of treatment even though you feel ill unless your doctor tells you to stop. Call your doctor or health care professional for advice if you get a fever, chills or sore throat, or other symptoms of a cold or flu. Do not treat yourself. This drug decreases your body's ability to fight infections. Try to avoid being around people who are sick. This medicine may increase your risk to bruise or bleed. Call your doctor or health care professional if you notice any unusual bleeding. Be careful brushing and flossing your teeth or using a toothpick because you may get an infection or bleed more easily. If you have any dental work done, tell your dentist you are receiving this medicine. Avoid taking products that contain aspirin, acetaminophen, ibuprofen, naproxen, or ketoprofen unless instructed by your doctor. These medicines may hide a fever. Do not become pregnant while taking this medicine. Women should inform their doctor if they wish to become pregnant or think they might be pregnant. There is a potential for serious side effects to an unborn child. Talk to your health care professional or pharmacist for more information. Do not breast-feed an infant while taking this medicine. Men are advised not to father a child while receiving this medicine. This product may contain alcohol. Ask your pharmacist or healthcare provider if this medicine contains alcohol. Be sure to tell all  healthcare providers you are taking this medicine. Certain medicines, like metronidazole and disulfiram, can cause an unpleasant reaction when taken with alcohol. The reaction includes flushing, headache, nausea, vomiting, sweating, and increased thirst. The reaction can last from 30 minutes to several hours. What side effects may I notice from receiving this medicine? Side effects that you should report to your doctor or health care professional as soon as possible: -allergic reactions like skin rash, itching or hives, swelling of the face, lips, or tongue -low blood counts - This drug may decrease the number of white blood cells, red blood cells and platelets. You may be at increased risk for infections and bleeding. -signs of infection - fever or chills, cough, sore throat, pain or difficulty passing urine -signs of decreased platelets or bleeding - bruising, pinpoint red spots on the skin, black, tarry stools, nosebleeds -signs of decreased red blood cells - unusually weak or tired, fainting spells, lightheadedness -breathing problems -chest pain -high or low blood pressure -mouth sores -nausea and vomiting -pain, swelling, redness or irritation at the injection site -pain, tingling, numbness in the hands or feet -slow or irregular heartbeat -swelling of the ankle, feet, hands Side effects that usually do not require medical attention (report to your doctor or health care professional if they continue or are bothersome): -bone pain -complete hair loss including hair on your head, underarms, pubic hair, eyebrows, and eyelashes -changes in the color of fingernails -diarrhea -loosening of the fingernails -loss of appetite -muscle or joint pain -red flush to skin -sweating This list may not describe all possible side  effects. Call your doctor for medical advice about side effects. You may report side effects to FDA at 1-800-FDA-1088. Where should I keep my medicine? This drug is given in  a hospital or clinic and will not be stored at home. NOTE: This sheet is a summary. It may not cover all possible information. If you have questions about this medicine, talk to your doctor, pharmacist, or health care provider.  2017 [employer] (2015-06-22 19:58:00)   Carboplatin injection What is this medicine? CARBOPLATIN (KAR boe pla tin) is a chemotherapy drug. It targets fast dividing cells, like cancer cells, and causes these cells to die. This medicine is used to treat ovarian cancer and many other cancers. This medicine may be used for other purposes; ask your health care provider or pharmacist if you have questions. COMMON BRAND NAME(S): Paraplatin What should I tell my health care provider before I take this medicine? They need to know if you have any of these conditions: -blood disorders -hearing problems -kidney disease -recent or ongoing radiation therapy -an unusual or allergic reaction to carboplatin, cisplatin, other chemotherapy, other medicines, foods, dyes, or preservatives -pregnant or trying to get pregnant -breast-feeding How should I use this medicine? This drug is usually given as an infusion into a vein. It is administered in a hospital or clinic by a specially trained health care professional. Talk to your pediatrician regarding the use of this medicine in children. Special care may be needed. Overdosage: If you think you have taken too much of this medicine contact a poison control center or emergency room at once. NOTE: This medicine is only for you. Do not share this medicine with others. What if I miss a dose? It is important not to miss a dose. Call your doctor or health care professional if you are unable to keep an appointment. What may interact with this medicine? -medicines for seizures -medicines to increase blood counts like filgrastim, pegfilgrastim, sargramostim -some antibiotics like amikacin, gentamicin, neomycin, streptomycin,  tobramycin -vaccines Talk to your doctor or health care professional before taking any of these medicines: -acetaminophen -aspirin -ibuprofen -ketoprofen -naproxen This list may not describe all possible interactions. Give your health care provider a list of all the medicines, herbs, non-prescription drugs, or dietary supplements you use. Also tell them if you smoke, drink alcohol, or use illegal drugs. Some items may interact with your medicine. What should I watch for while using this medicine? Your condition will be monitored carefully while you are receiving this medicine. You will need important blood work done while you are taking this medicine. This drug may make you feel generally unwell. This is not uncommon, as chemotherapy can affect healthy cells as well as cancer cells. Report any side effects. Continue your course of treatment even though you feel ill unless your doctor tells you to stop. In some cases, you may be given additional medicines to help with side effects. Follow all directions for their use. Call your doctor or health care professional for advice if you get a fever, chills or sore throat, or other symptoms of a cold or flu. Do not treat yourself. This drug decreases your body's ability to fight infections. Try to avoid being around people who are sick. This medicine may increase your risk to bruise or bleed. Call your doctor or health care professional if you notice any unusual bleeding. Be careful brushing and flossing your teeth or using a toothpick because you may get an infection or bleed more easily. If you  have any dental work done, tell your dentist you are receiving this medicine. Avoid taking products that contain aspirin, acetaminophen, ibuprofen, naproxen, or ketoprofen unless instructed by your doctor. These medicines may hide a fever. Do not become pregnant while taking this medicine. Women should inform their doctor if they wish to become pregnant or think  they might be pregnant. There is a potential for serious side effects to an unborn child. Talk to your health care professional or pharmacist for more information. Do not breast-feed an infant while taking this medicine. What side effects may I notice from receiving this medicine? Side effects that you should report to your doctor or health care professional as soon as possible: -allergic reactions like skin rash, itching or hives, swelling of the face, lips, or tongue -signs of infection - fever or chills, cough, sore throat, pain or difficulty passing urine -signs of decreased platelets or bleeding - bruising, pinpoint red spots on the skin, black, tarry stools, nosebleeds -signs of decreased red blood cells - unusually weak or tired, fainting spells, lightheadedness -breathing problems -changes in hearing -changes in vision -chest pain -high blood pressure -low blood counts - This drug may decrease the number of white blood cells, red blood cells and platelets. You may be at increased risk for infections and bleeding. -nausea and vomiting -pain, swelling, redness or irritation at the injection site -pain, tingling, numbness in the hands or feet -problems with balance, talking, walking -trouble passing urine or change in the amount of urine Side effects that usually do not require medical attention (report to your doctor or health care professional if they continue or are bothersome): -hair loss -loss of appetite -metallic taste in the mouth or changes in taste This list may not describe all possible side effects. Call your doctor for medical advice about side effects. You may report side effects to FDA at 1-800-FDA-1088. Where should I keep my medicine? This drug is given in a hospital or clinic and will not be stored at home. NOTE: This sheet is a summary. It may not cover all possible information. If you have questions about this medicine, talk to your doctor, pharmacist, or health care  provider.  2017 [employer] Standard (2007-11-26 14:38:05)

## 2016-08-14 NOTE — Progress Notes (Signed)
[facility] [phone]   [phone]  OFFICE PROGRESS NOTE  [name], MD [address] [place] [address]  DIAGNOSIS: Recurrent non-small cell lung cancer, squamous cell carcinoma initially diagnosed as unresectable stage III in February 2015.  PRIOR THERAPY: 1) Status post exploratory right VATS with mediastinal biopsy under the care of Dr. Roxan Hockey on 11/13/2013. The tumor was found to be unresectable at that time.[name] 2) Concurrent chemoradiation with weekly carboplatin for AUC of 2 and paclitaxel 45 mg/M2, status post 6 cycles with partial response. First cycle was given on 12/01/2013. 3) Consolidation chemotherapy with carboplatin for AUC of 5 and paclitaxel 175 mg/M2 every 3 weeks with Neulasta support. First dose 02/23/2014. Status post 3 cycles with partial response.  CURRENT THERAPY: Systemic chemotherapy with carboplatin for AUC of 5 and paclitaxel 175 MG/M2 every 3 weeks with Neulasta support. First cycle 08/14/2016.  INTERVAL HISTORY: Kimberly [name] 75 y.o. female returns to the clinic today for follow-up visit accompanied by her daughter. The patient is complaining of recent sinus headache and congestion. She was seen by her primary care physician Dr. Shelia Media and started on amoxicillin. She is feeling a little bit better. She continues to complain of fatigue. She has no chest pain but has shortness breath with exertion with mild cough and no hemoptysis. She denied having any fever or chills. She has no nausea or vomiting. She was complaining of low back pain and I order MRI of the lumbar spine as well as MRI of the brain and she is here for evaluation and discussion of her scan results before starting the first cycle of chemotherapy.  MEDICAL HISTORY: Past Medical History:  Diagnosis Date  . Anxiety    due to surgery   . Aortic insufficiency   . Arthritis   . Atrial fibrillation ([place])   . Bronchitis     . Cough    dry, endobronchial mass  . Dyslipidemia   . GERD (gastroesophageal reflux disease)   . Heart murmur   . History of nuclear stress test 02/26/2006   exercise myoview; normal pattern of perfusion; low risk scan   . Hypertension   . Hypothyroidism   . Mitral insufficiency   . Pneumonia    pus bronchitis  . PONV (postoperative nausea and vomiting)   . Radiation 12/01/13-01/08/14   50.4 gray to right central chest  . Shortness of breath    with exertion  . Squamous cell carcinoma of lung ([place])   . Syncope    "states she has passed out a few times. dr is trying to find cause.last time when geeting ready to go home after video bronch/bx  . Thyroid disease     ALLERGIES:  is allergic to amiodarone and milk-related compounds.  MEDICATIONS:  Current Outpatient Prescriptions  Medication Sig Dispense Refill  . albuterol (PROVENTIL HFA;VENTOLIN HFA) 108 (90 BASE) MCG/ACT inhaler Inhale 1 puff into the lungs every 6 (six) hours as needed for wheezing or shortness of breath.     [name] amoxicillin-clavulanate (AUGMENTIN) 875-125 MG tablet Take 1 tablet by mouth 2 (two) times daily.    [name] aspirin EC 81 MG tablet Take 81 mg by mouth daily.    [name] atorvastatin (LIPITOR) 20 MG tablet Take 20 mg by mouth every morning.     . Calcium Carb-Cholecalciferol (CALCIUM 600 + D PO) Take 1 tablet by mouth daily.    . chlorthalidone (HYGROTON) 25 MG tablet Take 1 tablet (  25 mg total) by mouth daily. 90 tablet 3  . Cholecalciferol (VITAMIN D3) 1000 units CAPS Take 1,000 Units by mouth daily.    [name] levothyroxine (SYNTHROID, LEVOTHROID) 150 MCG tablet Take 150 mcg by mouth at bedtime.    . meloxicam (MOBIC) 7.5 MG tablet Take 7.5 mg by mouth 2 (two) times daily as needed for pain.    . methocarbamol (ROBAXIN) 500 MG tablet Take 500 mg by mouth every 8 (eight) hours as needed for muscle spasms.    . metoprolol succinate (TOPROL-XL) 25 MG 24 hr tablet Take one-half tablet by  mouth daily 45 tablet 3  .  montelukast (SINGULAIR) 10 MG tablet Take 10 mg by mouth at bedtime.     . Multiple Vitamins-Iron (MULTIVITAMIN/IRON PO) Take 1 tablet by mouth daily.     . traMADol (ULTRAM) 50 MG tablet Take by mouth every 8 (eight) hours as needed.     No current facility-administered medications for this visit.     SURGICAL HISTORY:  Past Surgical History:  Procedure Laterality Date  . CARDIAC CATHETERIZATION  07/08/2008   normal coronaries  . CARPAL TUNNEL RELEASE Right 1988  . COLONOSCOPY N/A 02/11/2016   Procedure: COLONOSCOPY;  Surgeon: Carol Ada, MD;  Location: [facility];  Service: Endoscopy;  Laterality: N/A;  . DILATION AND CURETTAGE OF UTERUS    . EYE SURGERY Bilateral   . H/O MET Test w/PFT  04/02/2012   low risk; peak VO2 77% predicted  . JOINT REPLACEMENT  2003   thumb rt  . KNEE ARTHROSCOPY  12   rt meniscus  . MEDIASTINOSCOPY N/A 10/30/2013   Procedure: MEDIASTINOSCOPY;  Surgeon: Melrose Nakayama, MD;  Location: [facility];  Service: Thoracic;  Laterality: N/A;  . ROTATOR CUFF REPAIR  2006   ? side  . THYROIDECTOMY  1973  . TRANSTHORACIC ECHOCARDIOGRAM  09/25/2012   EF 55-60%; mild LVH & mild concentric hypertrophy; mild AV regurg; RV systolic pressure increase consistent with mild pulm HTN  . VIDEO ASSISTED THORACOSCOPY (VATS)/ LOBECTOMY Right 11/13/2013   Procedure: VIDEO ASSISTED THORACOSCOPY (VATS) with mediastinal  biopsies;  Surgeon: Melrose Nakayama, MD;  Location: [facility];  Service: Thoracic;  Laterality: Right;  RIGHT VATS,mediastinal biopsies  . VIDEO BRONCHOSCOPY Bilateral 09/25/2013   Procedure: VIDEO BRONCHOSCOPY WITHOUT FLUORO;  Surgeon: Tanda Rockers, MD;  Location: [facility];  Service: Cardiopulmonary;  Laterality: Bilateral;  . VIDEO BRONCHOSCOPY WITH ENDOBRONCHIAL ULTRASOUND N/A 10/30/2013   Procedure: VIDEO BRONCHOSCOPY WITH ENDOBRONCHIAL ULTRASOUND;  Surgeon: Melrose Nakayama, MD;  Location: [facility];  Service: Thoracic;  Laterality: N/A;    REVIEW OF  SYSTEMS:  Constitutional: positive for fatigue Eyes: negative Ears, nose, mouth, throat, and face: positive for Sinusitis Respiratory: positive for dyspnea on exertion Cardiovascular: negative for dyspnea, orthopnea and palpitations Gastrointestinal: negative for constipation, diarrhea, nausea and vomiting Genitourinary:negative for dysuria, frequency and hematuria Integument/breast: negative for pruritus and rash Hematologic/lymphatic: negative for bleeding, easy bruising and lymphadenopathy Musculoskeletal:positive for arthralgias and back pain Neurological: negative for dizziness, seizures and tremors Behavioral/Psych: negative Endocrine: negative Allergic/Immunologic: negative   PHYSICAL EXAMINATION: General appearance: alert, cooperative, fatigued and no distress Head: Normocephalic, without obvious abnormality, atraumatic Neck: no adenopathy, no JVD, supple, symmetrical, trachea midline and thyroid not enlarged, symmetric, no tenderness/mass/nodules Lymph nodes: Cervical, supraclavicular, and axillary nodes normal. Resp: clear to auscultation bilaterally Back: symmetric, no curvature. ROM normal. No CVA tenderness. Cardio: regular rate and rhythm, S1, S2 normal, no murmur, click, rub or gallop GI: soft, non-tender;  bowel sounds normal; no masses,  no organomegaly Extremities: extremities normal, atraumatic, no cyanosis or edema Neurologic: Alert and oriented X 3, normal strength and tone. Normal symmetric reflexes. Normal coordination and gait  ECOG PERFORMANCE STATUS: 1 - Symptomatic but completely ambulatory  Blood pressure (!) 157/68, pulse 86, temperature 98.6 F (37 C), temperature source Oral, resp. rate 18, height 4' 10.5" (1.486 m), weight 165 lb (74.8 kg), SpO2 98 %.  LABORATORY DATA: Lab Results  Component Value Date   WBC 10.6 (H) 08/14/2016   HGB 12.0 08/14/2016   HCT 35.7 08/14/2016   MCV 93.5 08/14/2016   PLT 277 08/14/2016      Chemistry      Component  Value Date/Time   NA 139 07/10/2016 1210   K 4.1 07/10/2016 1210   CL 96 04/13/2014 0814   CO2 31 (H) 07/10/2016 1210   BUN 27.8 (H) 07/10/2016 1210   CREATININE 1.1 07/10/2016 1210      Component Value Date/Time   CALCIUM 10.1 07/10/2016 1210   ALKPHOS 97 07/10/2016 1210   AST 19 07/10/2016 1210   ALT 18 07/10/2016 1210   BILITOT 0.37 07/10/2016 1210       RADIOGRAPHIC STUDIES: Mr Jeri Cos OZ Contrast  Result Date: 08/11/2016 CLINICAL DATA:  Lung cancer staging EXAM: MRI HEAD WITHOUT AND WITH CONTRAST TECHNIQUE: Multiplanar, multiecho pulse sequences of the brain and surrounding structures were obtained without and with intravenous contrast. CONTRAST:  30m MULTIHANCE GADOBENATE DIMEGLUMINE 529 MG/ML IV SOLN COMPARISON:  MRI head 11/11/2014 FINDINGS: Brain: Ventricle size and cerebral volume normal. Negative for acute infarct. Scattered small white matter hyperintensities bilaterally consistent with mild chronic microvascular ischemia. Negative for hemorrhage. Negative for mass or edema. Normal enhancement following contrast infusion. No enhancing metastatic disease. Leptomeningeal enhancement is normal. Vascular: Normal arterial flow voids. Skull and upper cervical spine: Negative Sinuses/Orbits: Negative Other: None IMPRESSION: Negative for metastatic disease to the brain. Normal for age MRI of the brain with contrast. Electronically Signed   By: CFranchot GalloM.D.   On: 08/11/2016 08:42   Mr Lumbar Spine W Wo Contrast  Result Date: 08/11/2016 CLINICAL DATA:  Lung cancer. PET scan question lumbar metastatic disease. Hypermetabolic activity posterior elements of L3 EXAM: MRI LUMBAR SPINE WITHOUT AND WITH CONTRAST TECHNIQUE: Multiplanar and multiecho pulse sequences of the lumbar spine were obtained without and with intravenous contrast. CONTRAST:  147mMULTIHANCE GADOBENATE DIMEGLUMINE 529 MG/ML IV SOLN COMPARISON:  PET 07/25/2016 FINDINGS: Segmentation:  Normal Alignment: Mild  levoscoliosis at L3. Mild anterolisthesis L3-4 and L4-5. Vertebrae:  Discogenic edema and enhancement on the right at L2-3. Extensive facet arthropathy at L3-4 and L4-5. There is degenerative change between the spinous processes of L3 and L4 with edema and enhancement of the soft tissues as well as cysts in the adjacent soft tissues. This is most compatible with Baastrup's disease and corresponds to the PET scan finding. This appears inflammatory and not neoplastic. Conus medullaris: Extends to the L1-2 level and appears normal. Paraspinal and other soft tissues: Retroperitoneal structures normal. Paraspinous muscles are symmetric and well developed. Disc levels: L1-2:  Negative L2-3: Disc degeneration, right greater than left related to scoliosis. Endplate edema and enhancement. Diffuse disc bulging and endplate spurring. Bilateral facet degeneration. Moderate spinal stenosis. Mild foraminal narrowing bilaterally L3-4: Mild anterolisthesis. Severe facet degeneration. Diffuse disc bulging. Severe spinal stenosis. Moderate to severe foraminal encroachment bilaterally. L4-5: Mild anterolisthesis. Severe facet degeneration. 5 mm cyst in the left foramen appears to be a synovial  cyst arising from the facet joint and is compressing the left L4 nerve root. Severe spinal stenosis. Small cysts adjacent to the L3 and L4 spinous processes compatible with degenerative change between the spinous processes. This corresponds to the PET scan. L5-S1: Negative IMPRESSION: Negative for metastatic disease PET scan findings corresponds to severe facet degeneration bilaterally at L3-4 L4-5. In addition, there is significant degenerative change and soft tissues cystic changes between the spinous processes of L3 and L4, consistent with Baastrup's disease. Moderate spinal stenosis L2-3 with mild foraminal narrowing bilaterally. Severe spinal stenosis L3-4 with moderate to severe foraminal encroachment bilaterally Severe spinal stenosis  L4-5. 5 mm synovial cyst projecting into the left foramen compressing the left L4 nerve root. Electronically Signed   By: Franchot Gallo M.D.   On: 08/11/2016 09:05   Nm Pet Image Restag (ps) Skull Base To Thigh  Result Date: 07/25/2016 CLINICAL DATA:  Subsequent treatment strategy for right lower lobe lung cancer. Enlarging mediastinal nodes. EXAM: NUCLEAR MEDICINE PET SKULL BASE TO THIGH TECHNIQUE: 8.1 mCi F-18 FDG was injected intravenously. Full-ring PET imaging was performed from the skull base to thigh after the radiotracer. CT data was obtained and used for attenuation correction and anatomic localization. FASTING BLOOD GLUCOSE:  Value: 94 mg/dl COMPARISON:  Chest CT 07/10/2016.  Prior PET of 10/15/2013. FINDINGS: NECK No areas of abnormal hypermetabolism. No cervical adenopathy. Surgical changes of at least partial thyroidectomy. CHEST Hypermetabolism corresponding to the enlarging subcarinal soft tissue. This measures 2.1 cm and a S.U.V. max of 15.0 on image 60/series 4. There is low-level hypermetabolism within right middle lobe consolidation which is likely radiation induced. A more focal area of hypermetabolism identified within measures a S.U.V. max of 8.9 on approximately image 30/series 7. No well-defined mass in this area. Distal esophageal hypermetabolism is without CT correlate and measures a S.U.V. max of 6.9 on image 80/series 4. Other chest findings deferred to recent diagnostic CT. Multivessel coronary artery atherosclerosis. Trace right pleural thickening. Right lower lobe radiation fibrosis as well. Calcified left lower lobe granuloma. ABDOMEN/PELVIS No abdominal pelvic nodal or parenchymal hypermetabolism. Tiny hiatal hernia. Aortic and branch vessel atherosclerosis. SKELETON New hypermetabolism centered about the posterior elements at L3. This measures a S.U.V. max of 7.3. No well-defined CT correlate. New muscular hypermetabolism about the posterior aspect of the left scapula. This  measures a S.U.V. max of 5.6, including on image 51/series 4. No CT correlate today or on the prior diagnostic CT. Lipomas identified posterior to the left scapula and within the right thigh. IMPRESSION: 1. Hypermetabolism corresponding to subcarinal nodal tissue, consistent with metastatic disease. 2. Right middle lobe radiation fibrosis with a focus of hypermetabolism within, favored to be inflammatory. Recommend attention on follow-up. 3. Hypermetabolism about the posterior elements of L3, without definite CT correlate. Suspicious for metastatic disease. Degenerative etiology felt less likely. Consider dedicated pre and post contrast lumbar spine MRI. A muscular focus of hypermetabolism posterior to the left scapula is without CT correlate and indeterminate. Especially if the L3 hypermetabolism is confirmed to be an osseous metastasis, muscular metastasis is suspected. 4. Small hiatal hernia with likely physiologic or inflammatory distal esophageal hypermetabolism. 5.  Coronary artery atherosclerosis. Aortic atherosclerosis. Electronically Signed   By: Abigail Miyamoto M.D.   On: 07/25/2016 10:36    ASSESSMENT AND PLAN: This is a very pleasant 75 years old white female with the following condition. 1) recurrent non-small cell lung cancer, squamous cell carcinoma this was initially diagnosed as unresectable stage IIIa status  post concurrent chemoradiation followed by consolidation chemotherapy. The patient imaging studies including CT scan of the chest and PET scan showed evidence for disease recurrence in the subcarinal lymphadenopathy. The recent MRI of the brain and lumbar spine showed no evidence for metastatic disease. I discussed with the patient her treatment options last visit. She is here today to start the first cycle of systemic chemotherapy with carboplatin for AUC of 5 and paclitaxel 175 MG/M2 every 3 weeks. I reminded the patient adverse effect of this treatment. 2) persistent low back pain: MRI  of the lumbar spine showed no evidence for metastatic disease. 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 3) hypertension: The patient is very anxious today and her blood pressure still uncontrolled but it's usually much better at home. 4) acute sinusitis: She will continue her current treatment with amoxicillin. The patient would come back for follow-up visit in 3 weeks for evaluation before starting cycle #2. She was advised to call immediately if she has any concerning symptoms in the interval. The patient voices understanding of current disease status and treatment options and is in agreement with the current care plan.  All questions were answered. The patient knows to call the clinic with any problems, questions or concerns. We can certainly see the patient much sooner if necessary.  Disclaimer: This note was dictated with voice recognition software. Similar sounding words can inadvertently be transcribed and may not be corrected upon review.

## 2016-08-15 ENCOUNTER — Telehealth: Payer: Self-pay | Admitting: *Deleted

## 2016-08-15 NOTE — Telephone Encounter (Signed)
Per LOS I have scheduled appts and notified the scheduler 

## 2016-08-16 ENCOUNTER — Ambulatory Visit (HOSPITAL_BASED_OUTPATIENT_CLINIC_OR_DEPARTMENT_OTHER): Payer: Medicare Other

## 2016-08-16 VITALS — BP 144/76 | HR 88 | Temp 97.7°F | Resp 18

## 2016-08-16 DIAGNOSIS — Z5189 Encounter for other specified aftercare: Secondary | ICD-10-CM | POA: Diagnosis not present

## 2016-08-16 DIAGNOSIS — C342 Malignant neoplasm of middle lobe, bronchus or lung: Secondary | ICD-10-CM

## 2016-08-16 MED ORDER — PEGFILGRASTIM INJECTION 6 MG/0.6ML ~~LOC~~
6.0000 mg | PREFILLED_SYRINGE | Freq: Once | SUBCUTANEOUS | Status: AC
  Administered 2016-08-16: 6 mg via SUBCUTANEOUS
  Filled 2016-08-16: qty 0.6

## 2016-08-16 NOTE — Patient Instructions (Signed)
Pegfilgrastim injection What is this medicine? PEGFILGRASTIM (PEG fil gra stim) is a long-acting granulocyte colony-stimulating factor that stimulates the growth of neutrophils, a type of white blood cell important in the body's fight against infection. It is used to reduce the incidence of fever and infection in patients with certain types of cancer who are receiving chemotherapy that affects the bone marrow, and to increase survival after being exposed to high doses of radiation. This medicine may be used for other purposes; ask your health care provider or pharmacist if you have questions. COMMON BRAND NAME(S): Neulasta What should I tell my health care provider before I take this medicine? They need to know if you have any of these conditions: -kidney disease -latex allergy -ongoing radiation therapy -sickle cell disease -skin reactions to acrylic adhesives (On-Body Injector only) -an unusual or allergic reaction to pegfilgrastim, filgrastim, other medicines, foods, dyes, or preservatives -pregnant or trying to get pregnant -breast-feeding How should I use this medicine? This medicine is for injection under the skin. If you get this medicine at home, you will be taught how to prepare and give the pre-filled syringe or how to use the On-body Injector. Refer to the patient Instructions for Use for detailed instructions. Use exactly as directed. Take your medicine at regular intervals. Do not take your medicine more often than directed. It is important that you put your used needles and syringes in a special sharps container. Do not put them in a trash can. If you do not have a sharps container, call your pharmacist or healthcare provider to get one. Talk to your pediatrician regarding the use of this medicine in children. While this drug may be prescribed for selected conditions, precautions do apply. Overdosage: If you think you have taken too much of this medicine contact a poison control  center or emergency room at once. NOTE: This medicine is only for you. Do not share this medicine with others. What if I miss a dose? It is important not to miss your dose. Call your doctor or health care professional if you miss your dose. If you miss a dose due to an On-body Injector failure or leakage, a new dose should be administered as soon as possible using a single prefilled syringe for manual use. What may interact with this medicine? Interactions have not been studied. Give your health care provider a list of all the medicines, herbs, non-prescription drugs, or dietary supplements you use. Also tell them if you smoke, drink alcohol, or use illegal drugs. Some items may interact with your medicine. This list may not describe all possible interactions. Give your health care provider a list of all the medicines, herbs, non-prescription drugs, or dietary supplements you use. Also tell them if you smoke, drink alcohol, or use illegal drugs. Some items may interact with your medicine. What should I watch for while using this medicine? You may need blood work done while you are taking this medicine. If you are going to need a MRI, CT scan, or other procedure, tell your doctor that you are using this medicine (On-Body Injector only). What side effects may I notice from receiving this medicine? Side effects that you should report to your doctor or health care professional as soon as possible: -allergic reactions like skin rash, itching or hives, swelling of the face, lips, or tongue -dizziness -fever -pain, redness, or irritation at site where injected -pinpoint red spots on the skin -red or dark-brown urine -shortness of breath or breathing problems -stomach or   side pain, or pain at the shoulder -swelling -tiredness -trouble passing urine or change in the amount of urine Side effects that usually do not require medical attention (report to your doctor or health care professional if they  continue or are bothersome): -bone pain -muscle pain This list may not describe all possible side effects. Call your doctor for medical advice about side effects. You may report side effects to FDA at 1-800-FDA-1088. Where should I keep my medicine? Keep out of the reach of children. Store pre-filled syringes in a refrigerator between 2 and 8 degrees C (36 and 46 degrees F). Do not freeze. Keep in carton to protect from light. Throw away this medicine if it is left out of the refrigerator for more than 48 hours. Throw away any unused medicine after the expiration date. NOTE: This sheet is a summary. It may not cover all possible information. If you have questions about this medicine, talk to your doctor, pharmacist, or health care provider.  2017 Elsevier/Gold Standard (2014-09-10 14:30:14)  

## 2016-08-18 ENCOUNTER — Other Ambulatory Visit: Payer: Self-pay | Admitting: Student

## 2016-08-18 ENCOUNTER — Telehealth: Payer: Self-pay | Admitting: *Deleted

## 2016-08-18 ENCOUNTER — Other Ambulatory Visit (HOSPITAL_BASED_OUTPATIENT_CLINIC_OR_DEPARTMENT_OTHER): Payer: Medicare Other

## 2016-08-18 ENCOUNTER — Other Ambulatory Visit: Payer: Self-pay | Admitting: Radiology

## 2016-08-18 ENCOUNTER — Ambulatory Visit: Payer: Medicare Other | Admitting: Nurse Practitioner

## 2016-08-18 ENCOUNTER — Encounter: Payer: Self-pay | Admitting: Nurse Practitioner

## 2016-08-18 ENCOUNTER — Ambulatory Visit (HOSPITAL_BASED_OUTPATIENT_CLINIC_OR_DEPARTMENT_OTHER): Payer: Medicare Other | Admitting: Nurse Practitioner

## 2016-08-18 VITALS — BP 121/76 | HR 117 | Temp 98.8°F | Resp 18 | Ht 58.5 in | Wt 161.9 lb

## 2016-08-18 DIAGNOSIS — R530 Neoplastic (malignant) related fatigue: Secondary | ICD-10-CM | POA: Diagnosis not present

## 2016-08-18 DIAGNOSIS — R197 Diarrhea, unspecified: Secondary | ICD-10-CM

## 2016-08-18 DIAGNOSIS — C342 Malignant neoplasm of middle lobe, bronchus or lung: Secondary | ICD-10-CM

## 2016-08-18 DIAGNOSIS — C3412 Malignant neoplasm of upper lobe, left bronchus or lung: Secondary | ICD-10-CM | POA: Diagnosis not present

## 2016-08-18 DIAGNOSIS — E860 Dehydration: Secondary | ICD-10-CM

## 2016-08-18 LAB — COMPREHENSIVE METABOLIC PANEL
ALT: 28 U/L (ref 0–55)
ANION GAP: 12 meq/L — AB (ref 3–11)
AST: 34 U/L (ref 5–34)
Albumin: 3.5 g/dL (ref 3.5–5.0)
Alkaline Phosphatase: 114 U/L (ref 40–150)
BUN: 26.2 mg/dL — ABNORMAL HIGH (ref 7.0–26.0)
CHLORIDE: 94 meq/L — AB (ref 98–109)
CO2: 29 meq/L (ref 22–29)
CREATININE: 1.3 mg/dL — AB (ref 0.6–1.1)
Calcium: 9.6 mg/dL (ref 8.4–10.4)
EGFR: 41 mL/min/{1.73_m2} — AB (ref >90)
Glucose: 134 mg/dl (ref 70–140)
Potassium: 4 mEq/L (ref 3.5–5.1)
SODIUM: 135 meq/L — AB (ref 136–145)
TOTAL PROTEIN: 7 g/dL (ref 6.4–8.3)
Total Bilirubin: 1.05 mg/dL (ref 0.20–1.20)

## 2016-08-18 LAB — CBC WITH DIFFERENTIAL/PLATELET
BASO%: 0.2 % (ref 0.0–2.0)
BASOS ABS: 0 10*3/uL (ref 0.0–0.1)
EOS%: 0.4 % (ref 0.0–7.0)
Eosinophils Absolute: 0.1 10*3/uL (ref 0.0–0.5)
HEMATOCRIT: 34.9 % (ref 34.8–46.6)
HEMOGLOBIN: 11.9 g/dL (ref 11.6–15.9)
LYMPH#: 1.1 10*3/uL (ref 0.9–3.3)
LYMPH%: 5 % — ABNORMAL LOW (ref 14.0–49.7)
MCH: 31.6 pg (ref 25.1–34.0)
MCHC: 34.1 g/dL (ref 31.5–36.0)
MCV: 92.6 fL (ref 79.5–101.0)
MONO#: 0.2 10*3/uL (ref 0.1–0.9)
MONO%: 0.7 % (ref 0.0–14.0)
NEUT#: 21.2 10*3/uL — ABNORMAL HIGH (ref 1.5–6.5)
NEUT%: 93.7 % — AB (ref 38.4–76.8)
Platelets: 201 10*3/uL (ref 145–400)
RBC: 3.77 10*6/uL (ref 3.70–5.45)
RDW: 13.2 % (ref 11.2–14.5)
WBC: 22.6 10*3/uL — ABNORMAL HIGH (ref 3.9–10.3)

## 2016-08-18 LAB — MAGNESIUM: Magnesium: 1.4 mg/dl — CL (ref 1.5–2.5)

## 2016-08-18 MED ORDER — LIDOCAINE-PRILOCAINE 2.5-2.5 % EX CREA: 1.0000 "application " | TOPICAL_CREAM | CUTANEOUS | 0 refills | Status: AC | PRN

## 2016-08-18 MED ORDER — DIPHENOXYLATE-ATROPINE 2.5-0.025 MG PO TABS: 2.0000 | ORAL_TABLET | Freq: Four times a day (QID) | ORAL | 0 refills | Status: DC | PRN

## 2016-08-18 MED ORDER — SODIUM CHLORIDE 0.9 % IV SOLN
Freq: Once | INTRAVENOUS | Status: AC
  Administered 2016-08-18: 15:00:00 via INTRAVENOUS

## 2016-08-18 NOTE — Telephone Encounter (Signed)
Daughter called from [place] to say mother" is not doing well after 1st chemo on Monday, has not been drinking and is having diarrhea"  RN spoke with Selena Lesser, NP  Pt will come in ASAP for labs, see [facility] and have IVF.

## 2016-08-18 NOTE — Assessment & Plan Note (Addendum)
Patient had her first cycle of chemotherapy on 08/14/2016; and has been experiencing multiple episodes of diarrhea and subsequent dehydration.  She has been taking intermittent Imodium as directed.  Also, patient has been taking Augmentin for previously diagnosed sinusitis.  She states she discontinued the last 3 days of the Augmentin just yesterday-stating that she felt that this was making her diarrhea worse.  The  On exam today.  Patient appears mildly dehydrated and fatigued; but nontoxic.  Labs obtained today did reveal the patient was dehydrated with sodium 135 and creatinine has increased to 1.3.  Patient will be given IV fluid rehydration while at the cancer Center today.  Also, patient was given a prescription for Lomotil to alternate with the Imodium.  Patient was advised that she could return to the [facility] for additional IV fluid rehydration.  If she feels she needs to.  She was also encouraged to push fluids at home is much as possible.

## 2016-08-18 NOTE — Patient Instructions (Signed)
Dehydration, Adult Dehydration is a condition in which there is not enough fluid or water in the body. This happens when you lose more fluids than you take in. Important organs, such as the kidneys, brain, and heart, cannot function without a proper amount of fluids. Any loss of fluids from the body can lead to dehydration. Dehydration can range from mild to severe. This condition should be treated right away to prevent it from becoming severe. What are the causes? This condition may be caused by:  Vomiting.  Diarrhea.  Excessive sweating, such as from heat exposure or exercise.  Not drinking enough fluid, especially:  When ill.  While doing activity that requires a lot of energy.  Excessive urination.  Fever.  Infection.  Certain medicines, such as medicines that cause the body to lose excess fluid (diuretics).  Inability to access safe drinking water.  Reduced physical ability to get adequate water and food. What increases the risk? This condition is more likely to develop in people:  Who have a poorly controlled long-term (chronic) illness, such as diabetes, heart disease, or kidney disease.  Who are age 65 or older.  Who are disabled.  Who live in a place with high altitude.  Who play endurance sports. What are the signs or symptoms? Symptoms of mild dehydration may include:   Thirst.  Dry lips.  Slightly dry mouth.  Dry, warm skin.  Dizziness. Symptoms of moderate dehydration may include:   Very dry mouth.  Muscle cramps.  Dark urine. Urine may be the color of tea.  Decreased urine production.  Decreased tear production.  Heartbeat that is irregular or faster than normal (palpitations).  Headache.  Light-headedness, especially when you stand up from a sitting position.  Fainting (syncope). Symptoms of severe dehydration may include:   Changes in skin, such as:  Cold and clammy skin.  Blotchy (mottled) or pale skin.  Skin that does  not quickly return to normal after being lightly pinched and released (poor skin turgor).  Changes in body fluids, such as:  Extreme thirst.  No tear production.  Inability to sweat when body temperature is high, such as in hot weather.  Very little urine production.  Changes in vital signs, such as:  Weak pulse.  Pulse that is more than 100 beats a minute when sitting still.  Rapid breathing.  Low blood pressure.  Other changes, such as:  Sunken eyes.  Cold hands and feet.  Confusion.  Lack of energy (lethargy).  Difficulty waking up from sleep.  Short-term weight loss.  Unconsciousness. How is this diagnosed? This condition is diagnosed based on your symptoms and a physical exam. Blood and urine tests may be done to help confirm the diagnosis. How is this treated? Treatment for this condition depends on the severity. Mild or moderate dehydration can often be treated at home. Treatment should be started right away. Do not wait until dehydration becomes severe. Severe dehydration is an emergency and it needs to be treated in a hospital. Treatment for mild dehydration may include:   Drinking more fluids.  Replacing salts and minerals in your blood (electrolytes) that you may have lost. Treatment for moderate dehydration may include:   Drinking an oral rehydration solution (ORS). This is a drink that helps you replace fluids and electrolytes (rehydrate). It can be found at pharmacies and retail stores. Treatment for severe dehydration may include:   Receiving fluids through an IV tube.  Receiving an electrolyte solution through a feeding tube that is   passed through your nose and into your stomach (nasogastric tube, or NG tube).  Correcting any abnormalities in electrolytes.  Treating the underlying cause of dehydration. Follow these instructions at home:  If directed by your health care provider, drink an ORS:  Make an ORS by following instructions on the  package.  Start by drinking small amounts, about  cup (120 mL) every 5-10 minutes.  Slowly increase how much you drink until you have taken the amount recommended by your health care provider.  Drink enough clear fluid to keep your urine clear or pale yellow. If you were told to drink an ORS, finish the ORS first, then start slowly drinking other clear fluids. Drink fluids such as:  Water. Do not drink only water. Doing that can lead to having too little salt (sodium) in the body (hyponatremia).  Ice chips.  Fruit juice that you have added water to (diluted fruit juice).  Low-calorie sports drinks.  Avoid:  Alcohol.  Drinks that contain a lot of sugar. These include high-calorie sports drinks, fruit juice that is not diluted, and soda.  Caffeine.  Foods that are greasy or contain a lot of fat or sugar.  Take over-the-counter and prescription medicines only as told by your health care provider.  Do not take sodium tablets. This can lead to having too much sodium in the body (hypernatremia).  Eat foods that contain a healthy balance of electrolytes, such as bananas, oranges, potatoes, tomatoes, and spinach.  Keep all follow-up visits as told by your health care provider. This is important. Contact a health care provider if:  You have abdominal pain that:  Gets worse.  Stays in one area (localizes).  You have a rash.  You have a stiff neck.  You are more irritable than usual.  You are sleepier or more difficult to wake up than usual.  You feel weak or dizzy.  You feel very thirsty.  You have urinated only a small amount of very dark urine over 6-8 hours. Get help right away if:  You have symptoms of severe dehydration.  You cannot drink fluids without vomiting.  Your symptoms get worse with treatment.  You have a fever.  You have a severe headache.  You have vomiting or diarrhea that:  Gets worse.  Does not go away.  You have blood or green matter  (bile) in your vomit.  You have blood in your stool. This may cause stool to look black and tarry.  You have not urinated in 6-8 hours.  You faint.  Your heart rate while sitting still is over 100 beats a minute.  You have trouble breathing. This information is not intended to replace advice given to you by your health care provider. Make sure you discuss any questions you have with your health care provider. Document Released: 08/21/2005 Document Revised: 03/17/2016 Document Reviewed: 10/15/2015 Elsevier Interactive Patient Education  2017 [employer]  

## 2016-08-18 NOTE — Assessment & Plan Note (Signed)
Patient received cycle one of her carboplatin/Taxol chemotherapy regimen on 08/14/2016.  She also underwent Neulasta for growth factor support as well.  See further notes for details of today's visit.  Patient will return for Port-A-Cath placement on Monday, 08/21/2016.  She is scheduled for labs, visit, and her next cycle of chemotherapy on 09/05/2016.

## 2016-08-18 NOTE — Assessment & Plan Note (Signed)
Patient had her first cycle of chemotherapy on 08/14/2016; and has been experiencing multiple episodes of diarrhea and subsequent dehydration.  She has been taking intermittent Imodium as directed.  On exam today.  Patient appears mildly dehydrated and fatigued; but nontoxic.  Labs obtained today did reveal the patient was dehydrated with sodium 135 and creatinine has increased to 1.3.  Patient will be given IV fluid rehydration while at the cancer Center today.  Also, patient was given a prescription for Lomotil to alternate with the Imodium.  Patient was advised that she could return to the [facility] for additional IV fluid rehydration.  If she feels she needs to.  She was also encouraged to push fluids at home is much as possible.  Note: Patient states that she typically has diarrhea becomes dehydrated after each chemotherapy cycle.  She is interested in discussing the possibility of receiving a prescheduled IV fluid rehydration once or twice after each chemotherapy cycle in the future.

## 2016-08-18 NOTE — Progress Notes (Signed)
SYMPTOM MANAGEMENT CLINIC    Chief Complaint: Diarrhea, dehydration  HPI:  Kimberly [name] 75 y.o. female diagnosed with lung cancer.  Currently undergoing Taxol/carboplatin chemotherapy regimen.    No history exists.    Review of Systems  Constitutional: Positive for malaise/fatigue.  Gastrointestinal: Positive for diarrhea.  All other systems reviewed and are negative.   Past Medical History:  Diagnosis Date  . Anxiety    due to surgery   . Aortic insufficiency   . Arthritis   . Atrial fibrillation ([place])   . Back pain 08/14/2016  . Bronchitis   . Cough    dry, endobronchial mass  . Dyslipidemia   . GERD (gastroesophageal reflux disease)   . Heart murmur   . History of nuclear stress test 02/26/2006   exercise myoview; normal pattern of perfusion; low risk scan   . Hypertension   . Hypothyroidism   . Mitral insufficiency   . Pneumonia    pus bronchitis  . PONV (postoperative nausea and vomiting)   . Radiation 12/01/13-01/08/14   50.4 gray to right central chest  . Shortness of breath    with exertion  . 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 Syncope    "states she has passed out a few times. dr is trying to find cause.last time when geeting ready to go home after video bronch/bx  . Thyroid disease     Past Surgical History:  Procedure Laterality Date  . CARDIAC CATHETERIZATION  07/08/2008   normal coronaries  . CARPAL TUNNEL RELEASE Right 1988  . COLONOSCOPY N/A 02/11/2016   Procedure: COLONOSCOPY;  Surgeon: Carol Ada, MD;  Location: [facility];  Service: Endoscopy;  Laterality: N/A;  . DILATION AND CURETTAGE OF UTERUS    . EYE SURGERY Bilateral   . H/O MET Test w/PFT  04/02/2012   low risk; peak VO2 77% predicted  . JOINT REPLACEMENT  2003   thumb rt  . KNEE ARTHROSCOPY  12   rt meniscus  . MEDIASTINOSCOPY N/A 10/30/2013   Procedure: MEDIASTINOSCOPY;  Surgeon: Melrose Nakayama, MD;  Location: [facility];  Service: Thoracic;  Laterality: N/A;  .  ROTATOR CUFF REPAIR  2006   ? side  . THYROIDECTOMY  1973  . TRANSTHORACIC ECHOCARDIOGRAM  09/25/2012   EF 55-60%; mild LVH & mild concentric hypertrophy; mild AV regurg; RV systolic pressure increase consistent with mild pulm HTN  . VIDEO ASSISTED THORACOSCOPY (VATS)/ LOBECTOMY Right 11/13/2013   Procedure: VIDEO ASSISTED THORACOSCOPY (VATS) with mediastinal  biopsies;  Surgeon: Melrose Nakayama, MD;  Location: [facility];  Service: Thoracic;  Laterality: Right;  RIGHT VATS,mediastinal biopsies  . VIDEO BRONCHOSCOPY Bilateral 09/25/2013   Procedure: VIDEO BRONCHOSCOPY WITHOUT FLUORO;  Surgeon: Tanda Rockers, MD;  Location: [facility];  Service: Cardiopulmonary;  Laterality: Bilateral;  . VIDEO BRONCHOSCOPY WITH ENDOBRONCHIAL ULTRASOUND N/A 10/30/2013   Procedure: VIDEO BRONCHOSCOPY WITH ENDOBRONCHIAL ULTRASOUND;  Surgeon: Melrose Nakayama, MD;  Location: [facility];  Service: Thoracic;  Laterality: N/A;    has Hyperlipidemia; Essential hypertension; ALLERGIC RHINITIS; ASTHMA; SHORTNESS OF BREATH (SOB); Syncope; Amaurosis fugax; Hemoptysis; Collapse of right lung: RML; Bronchial obstruction; Leukocytosis; Obstructive pneumonia; Lung cancer, middle lobe ([place]); Colitis; Bloody diarrhea; Atrial fibrillation with rapid ventricular response ([place]); Pleuritic chest pain; Atrial fibrillation ([place]); Diarrhea; Hyponatremia; Anemia; Symptomatic anemia; Other fatigue; Patient unable to exercise; Encounter for antineoplastic chemotherapy; Back pain; Poor venous access; and Dehydration on her problem list.    is allergic to amiodarone and milk-related  compounds.  Allergies as of 08/18/2016      Reactions   Amiodarone Other (See Comments)   Corneal deposits   Milk-related Compounds Other (See Comments)   GI upset, projectile vomiting - can't take any dairy products      Medication List       Accurate as of 08/18/16  6:29 PM. Always use your most recent med list.          albuterol 108 (90 Base)  MCG/ACT inhaler Commonly known as:  PROVENTIL HFA;VENTOLIN HFA Inhale 1 puff into the lungs every 6 (six) hours as needed for wheezing or shortness of breath.   amoxicillin-clavulanate 875-125 MG tablet Commonly known as:  AUGMENTIN Take 1 tablet by mouth 2 (two) times daily.   aspirin EC 81 MG tablet Take 81 mg by mouth daily.   atorvastatin 20 MG tablet Commonly known as:  LIPITOR Take 20 mg by mouth every morning.   CALCIUM 600 + D PO Take 1 tablet by mouth daily.   chlorthalidone 25 MG tablet Commonly known as:  HYGROTON Take 1 tablet (25 mg total) by mouth daily.   diphenoxylate-atropine 2.5-0.025 MG tablet Commonly known as:  LOMOTIL Take 2 tablets by mouth 4 (four) times daily as needed for diarrhea or loose stools.   levothyroxine 150 MCG tablet Commonly known as:  SYNTHROID, LEVOTHROID Take 150 mcg by mouth at bedtime.   lidocaine-prilocaine cream Commonly known as:  EMLA Apply 1 application topically as needed.   loperamide 2 MG capsule Commonly known as:  IMODIUM Take 2 mg by mouth as needed for diarrhea or loose stools.   meloxicam 7.5 MG tablet Commonly known as:  MOBIC Take 7.5 mg by mouth 2 (two) times daily as needed for pain.   methocarbamol 500 MG tablet Commonly known as:  ROBAXIN Take 500 mg by mouth every 8 (eight) hours as needed for muscle spasms.   metoprolol succinate 25 MG 24 hr tablet Commonly known as:  TOPROL-XL Take one-half tablet by  mouth daily   montelukast 10 MG tablet Commonly known as:  SINGULAIR Take 10 mg by mouth at bedtime.   MULTIVITAMIN/IRON PO Take 1 tablet by mouth daily.   ondansetron 8 MG tablet Commonly known as:  ZOFRAN Take 1 tablet (8 mg total) by mouth every 8 (eight) hours as needed for refractory nausea / vomiting (start on Day 3 after each treatment).   prochlorperazine 10 MG tablet Commonly known as:  COMPAZINE Take 1 tablet (10 mg total) by mouth every 6 (six) hours as needed for nausea or  vomiting.   traMADol 50 MG tablet Commonly known as:  ULTRAM Take by mouth every 8 (eight) hours as needed.   Vitamin D3 1000 units Caps Take 1,000 Units by mouth daily.        PHYSICAL EXAMINATION  Oncology Vitals 08/18/2016 08/16/2016  Height 149 cm -  Weight 73.437 kg -  Weight (lbs) 161 lbs 14 oz -  BMI (kg/m2) 33.26 kg/m2 -  Temp 98.8 97.7  Pulse 117 88  Resp 18 18  SpO2 100 97  BSA (m2) 1.74 m2 -   BP Readings from Last 2 Encounters:  08/18/16 121/76  08/16/16 (!) 144/76    Physical Exam  Constitutional: She is oriented to person, place, and time and well-developed, well-nourished, and in no distress.  HENT:  Head: Normocephalic and atraumatic.  Eyes: Conjunctivae and EOM are normal. Pupils are equal, round, and reactive to light.  Neck: Normal range of motion.  Pulmonary/Chest: Effort normal. No respiratory distress.  Musculoskeletal: Normal range of motion.  Neurological: She is alert and oriented to person, place, and time. Gait normal.  Skin: Skin is warm and dry.  Psychiatric: Affect normal.  Nursing note and vitals reviewed.   LABORATORY DATA:. Appointment on 08/18/2016  Component Date Value Ref Range Status  . WBC 08/18/2016 22.6* 3.9 - 10.3 10e3/uL Final  . NEUT# 08/18/2016 21.2* 1.5 - 6.5 10e3/uL Final  . HGB 08/18/2016 11.9  11.6 - 15.9 g/dL Final  . HCT 08/18/2016 34.9  34.8 - 46.6 % Final  . Platelets 08/18/2016 201  145 - 400 10e3/uL Final  . MCV 08/18/2016 92.6  79.5 - 101.0 fL Final  . MCH 08/18/2016 31.6  25.1 - 34.0 pg Final  . MCHC 08/18/2016 34.1  31.5 - 36.0 g/dL Final  . RBC 08/18/2016 3.77  3.70 - 5.45 10e6/uL Final  . RDW 08/18/2016 13.2  11.2 - 14.5 % Final  . lymph# 08/18/2016 1.1  0.9 - 3.3 10e3/uL Final  . MONO# 08/18/2016 0.2  0.1 - 0.9 10e3/uL Final  . Eosinophils Absolute 08/18/2016 0.1  0.0 - 0.5 10e3/uL Final  . Basophils Absolute 08/18/2016 0.0  0.0 - 0.1 10e3/uL Final  . NEUT% 08/18/2016 93.7* 38.4 - 76.8 % Final  .  LYMPH% 08/18/2016 5.0* 14.0 - 49.7 % Final  . MONO% 08/18/2016 0.7  0.0 - 14.0 % Final  . EOS% 08/18/2016 0.4  0.0 - 7.0 % Final  . BASO% 08/18/2016 0.2  0.0 - 2.0 % Final  . Sodium 08/18/2016 135* 136 - 145 mEq/L Final  . Potassium 08/18/2016 4.0  3.5 - 5.1 mEq/L Final  . Chloride 08/18/2016 94* 98 - 109 mEq/L Final  . CO2 08/18/2016 29  22 - 29 mEq/L Final  . Glucose 08/18/2016 134  70 - 140 mg/dl Final  . BUN 08/18/2016 26.2* 7.0 - 26.0 mg/dL Final  . Creatinine 08/18/2016 1.3* 0.6 - 1.1 mg/dL Final  . Total Bilirubin 08/18/2016 1.05  0.20 - 1.20 mg/dL Final  . Alkaline Phosphatase 08/18/2016 114  40 - 150 U/L Final  . AST 08/18/2016 34  5 - 34 U/L Final  . ALT 08/18/2016 28  0 - 55 U/L Final  . Total Protein 08/18/2016 7.0  6.4 - 8.3 g/dL Final  . Albumin 08/18/2016 3.5  3.5 - 5.0 g/dL Final  . Calcium 08/18/2016 9.6  8.4 - 10.4 mg/dL Final  . Anion Gap 08/18/2016 12* 3 - 11 mEq/L Final  . EGFR 08/18/2016 41* >90 ml/min/1.73 m2 Final  . Magnesium 08/18/2016 1.4* 1.5 - 2.5 mg/dl Final    RADIOGRAPHIC STUDIES: No results found.  ASSESSMENT/PLAN:    Lung cancer, middle lobe [facility]) Patient received cycle one of her carboplatin/Taxol chemotherapy regimen on 08/14/2016.  She also underwent Neulasta for growth factor support as well.  See further notes for details of today's visit.  Patient will return for Port-A-Cath placement on Monday, 08/21/2016.  She is scheduled for labs, visit, and her next cycle of chemotherapy on 09/05/2016.  Diarrhea Patient had her first cycle of chemotherapy on 08/14/2016; and has been experiencing multiple episodes of diarrhea and subsequent dehydration.  She has been taking intermittent Imodium as directed.  Also, patient has been taking Augmentin for previously diagnosed sinusitis.  She states she discontinued the last 3 days of the Augmentin just yesterday-stating that she felt that this was making her diarrhea worse.  The  On exam today.  Patient  appears mildly dehydrated  and fatigued; but nontoxic.  Labs obtained today did reveal the patient was dehydrated with sodium 135 and creatinine has increased to 1.3.  Patient will be given IV fluid rehydration while at the cancer Center today.  Also, patient was given a prescription for Lomotil to alternate with the Imodium.  Patient was advised that she could return to the [facility] for additional IV fluid rehydration.  If she feels she needs to.  She was also encouraged to push fluids at home is much as possible.    Dehydration Patient had her first cycle of chemotherapy on 08/14/2016; and has been experiencing multiple episodes of diarrhea and subsequent dehydration.  She has been taking intermittent Imodium as directed.  On exam today.  Patient appears mildly dehydrated and fatigued; but nontoxic.  Labs obtained today did reveal the patient was dehydrated with sodium 135 and creatinine has increased to 1.3.  Patient will be given IV fluid rehydration while at the cancer Center today.  Also, patient was given a prescription for Lomotil to alternate with the Imodium.  Patient was advised that she could return to the [facility] for additional IV fluid rehydration.  If she feels she needs to.  She was also encouraged to push fluids at home is much as possible.  Note: Patient states that she typically has diarrhea becomes dehydrated after each chemotherapy cycle.  She is interested in discussing the possibility of receiving a prescheduled IV fluid rehydration once or twice after each chemotherapy cycle in the future.   Patient stated understanding of all instructions; and was in agreement with this plan of care. The patient knows to call the clinic with any problems, questions or concerns.   Total time spent with patient was 25 minutes;  with greater than 75 percent of that time spent in face to face counseling regarding patient's symptoms,  and coordination of care and follow  up.  Disclaimer:This dictation was prepared with Dragon/digital dictation along with [employer]. Any transcriptional errors that result from this process are unintentional.  Drue Second, NP 08/18/2016

## 2016-08-19 ENCOUNTER — Other Ambulatory Visit: Payer: Self-pay

## 2016-08-19 ENCOUNTER — Emergency Department (HOSPITAL_COMMUNITY): Payer: Medicare Other

## 2016-08-19 ENCOUNTER — Encounter (HOSPITAL_COMMUNITY): Payer: Self-pay | Admitting: Emergency Medicine

## 2016-08-19 ENCOUNTER — Inpatient Hospital Stay (HOSPITAL_COMMUNITY)
Admission: EM | Admit: 2016-08-19 | Discharge: 2016-08-22 | DRG: 371 | Disposition: A | Discharge status: Home / Self Care | Payer: Medicare Other | Attending: Internal Medicine | Admitting: Internal Medicine

## 2016-08-19 DIAGNOSIS — J181 Lobar pneumonia, unspecified organism: Secondary | ICD-10-CM | POA: Diagnosis not present

## 2016-08-19 DIAGNOSIS — R112 Nausea with vomiting, unspecified: Secondary | ICD-10-CM

## 2016-08-19 DIAGNOSIS — E860 Dehydration: Secondary | ICD-10-CM | POA: Diagnosis not present

## 2016-08-19 DIAGNOSIS — I129 Hypertensive chronic kidney disease with stage 1 through stage 4 chronic kidney disease, or unspecified chronic kidney disease: Secondary | ICD-10-CM | POA: Diagnosis present

## 2016-08-19 DIAGNOSIS — J45909 Unspecified asthma, uncomplicated: Secondary | ICD-10-CM | POA: Diagnosis not present

## 2016-08-19 DIAGNOSIS — J189 Pneumonia, unspecified organism: Secondary | ICD-10-CM

## 2016-08-19 DIAGNOSIS — J4520 Mild intermittent asthma, uncomplicated: Secondary | ICD-10-CM | POA: Diagnosis not present

## 2016-08-19 DIAGNOSIS — Z87891 Personal history of nicotine dependence: Secondary | ICD-10-CM | POA: Diagnosis not present

## 2016-08-19 DIAGNOSIS — R509 Fever, unspecified: Secondary | ICD-10-CM | POA: Diagnosis not present

## 2016-08-19 DIAGNOSIS — K219 Gastro-esophageal reflux disease without esophagitis: Secondary | ICD-10-CM | POA: Diagnosis present

## 2016-08-19 DIAGNOSIS — I10 Essential (primary) hypertension: Secondary | ICD-10-CM | POA: Diagnosis not present

## 2016-08-19 DIAGNOSIS — I480 Paroxysmal atrial fibrillation: Secondary | ICD-10-CM | POA: Diagnosis not present

## 2016-08-19 DIAGNOSIS — Z888 Allergy status to other drugs, medicaments and biological substances status: Secondary | ICD-10-CM | POA: Diagnosis not present

## 2016-08-19 DIAGNOSIS — E871 Hypo-osmolality and hyponatremia: Secondary | ICD-10-CM | POA: Diagnosis present

## 2016-08-19 DIAGNOSIS — J440 Chronic obstructive pulmonary disease with acute lower respiratory infection: Secondary | ICD-10-CM | POA: Diagnosis present

## 2016-08-19 DIAGNOSIS — Z791 Long term (current) use of non-steroidal anti-inflammatories (NSAID): Secondary | ICD-10-CM

## 2016-08-19 DIAGNOSIS — E876 Hypokalemia: Secondary | ICD-10-CM | POA: Diagnosis not present

## 2016-08-19 DIAGNOSIS — D899 Disorder involving the immune mechanism, unspecified: Secondary | ICD-10-CM | POA: Diagnosis present

## 2016-08-19 DIAGNOSIS — C3490 Malignant neoplasm of unspecified part of unspecified bronchus or lung: Secondary | ICD-10-CM

## 2016-08-19 DIAGNOSIS — Z9221 Personal history of antineoplastic chemotherapy: Secondary | ICD-10-CM

## 2016-08-19 DIAGNOSIS — Z7982 Long term (current) use of aspirin: Secondary | ICD-10-CM

## 2016-08-19 DIAGNOSIS — Z91011 Allergy to milk products: Secondary | ICD-10-CM

## 2016-08-19 DIAGNOSIS — I351 Nonrheumatic aortic (valve) insufficiency: Secondary | ICD-10-CM | POA: Diagnosis not present

## 2016-08-19 DIAGNOSIS — E039 Hypothyroidism, unspecified: Secondary | ICD-10-CM | POA: Diagnosis present

## 2016-08-19 DIAGNOSIS — C3431 Malignant neoplasm of lower lobe, right bronchus or lung: Secondary | ICD-10-CM | POA: Diagnosis not present

## 2016-08-19 DIAGNOSIS — N183 Chronic kidney disease, stage 3 (moderate): Secondary | ICD-10-CM | POA: Diagnosis not present

## 2016-08-19 DIAGNOSIS — Z801 Family history of malignant neoplasm of trachea, bronchus and lung: Secondary | ICD-10-CM

## 2016-08-19 DIAGNOSIS — C342 Malignant neoplasm of middle lobe, bronchus or lung: Secondary | ICD-10-CM | POA: Diagnosis present

## 2016-08-19 DIAGNOSIS — C3491 Malignant neoplasm of unspecified part of right bronchus or lung: Secondary | ICD-10-CM | POA: Diagnosis not present

## 2016-08-19 DIAGNOSIS — Z923 Personal history of irradiation: Secondary | ICD-10-CM | POA: Diagnosis not present

## 2016-08-19 DIAGNOSIS — Z8249 Family history of ischemic heart disease and other diseases of the circulatory system: Secondary | ICD-10-CM | POA: Diagnosis not present

## 2016-08-19 DIAGNOSIS — A0472 Enterocolitis due to Clostridium difficile, not specified as recurrent: Secondary | ICD-10-CM | POA: Diagnosis not present

## 2016-08-19 DIAGNOSIS — E785 Hyperlipidemia, unspecified: Secondary | ICD-10-CM | POA: Diagnosis not present

## 2016-08-19 DIAGNOSIS — Z79899 Other long term (current) drug therapy: Secondary | ICD-10-CM

## 2016-08-19 DIAGNOSIS — I340 Nonrheumatic mitral (valve) insufficiency: Secondary | ICD-10-CM | POA: Diagnosis not present

## 2016-08-19 DIAGNOSIS — R197 Diarrhea, unspecified: Secondary | ICD-10-CM | POA: Diagnosis not present

## 2016-08-19 LAB — COMPREHENSIVE METABOLIC PANEL
ALT: 28 U/L (ref 14–54)
AST: 35 U/L (ref 15–41)
Albumin: 3.6 g/dL (ref 3.5–5.0)
Alkaline Phosphatase: 85 U/L (ref 38–126)
Anion gap: 11 (ref 5–15)
BUN: 22 mg/dL — AB (ref 6–20)
CHLORIDE: 94 mmol/L — AB (ref 101–111)
CO2: 26 mmol/L (ref 22–32)
Calcium: 8.4 mg/dL — ABNORMAL LOW (ref 8.9–10.3)
Creatinine, Ser: 1.16 mg/dL — ABNORMAL HIGH (ref 0.44–1.00)
GFR calc Af Amer: 53 mL/min — ABNORMAL LOW (ref >60)
GFR, EST NON AFRICAN AMERICAN: 46 mL/min — AB (ref >60)
Glucose, Bld: 143 mg/dL — ABNORMAL HIGH (ref 65–99)
Potassium: 3 mmol/L — ABNORMAL LOW (ref 3.5–5.1)
SODIUM: 131 mmol/L — AB (ref 135–145)
Total Bilirubin: 1 mg/dL (ref 0.3–1.2)
Total Protein: 6.5 g/dL (ref 6.5–8.1)

## 2016-08-19 LAB — DIFFERENTIAL
BASOS ABS: 0 10*3/uL (ref 0.0–0.1)
BASOS PCT: 0 %
EOS ABS: 0.2 10*3/uL (ref 0.0–0.7)
EOS PCT: 1 %
LYMPHS PCT: 7 %
Lymphs Abs: 0.7 10*3/uL (ref 0.7–4.0)
Monocytes Absolute: 0.1 10*3/uL (ref 0.1–1.0)
Monocytes Relative: 1 %
NEUTROS PCT: 91 %
Neutro Abs: 10.1 10*3/uL — ABNORMAL HIGH (ref 1.7–7.7)

## 2016-08-19 LAB — URINALYSIS, ROUTINE W REFLEX MICROSCOPIC
Bilirubin Urine: NEGATIVE
GLUCOSE, UA: NEGATIVE mg/dL
Ketones, ur: NEGATIVE mg/dL
Nitrite: NEGATIVE
PROTEIN: 30 mg/dL — AB
Specific Gravity, Urine: 1.015 (ref 1.005–1.030)
pH: 5 (ref 5.0–8.0)

## 2016-08-19 LAB — CBC
HCT: 31.4 % — ABNORMAL LOW (ref 36.0–46.0)
Hemoglobin: 11.1 g/dL — ABNORMAL LOW (ref 12.0–15.0)
MCH: 32.5 pg (ref 26.0–34.0)
MCHC: 35.4 g/dL (ref 30.0–36.0)
MCV: 91.8 fL (ref 78.0–100.0)
PLATELETS: 192 10*3/uL (ref 150–400)
RBC: 3.42 MIL/uL — AB (ref 3.87–5.11)
RDW: 13.2 % (ref 11.5–15.5)
WBC: 11.5 10*3/uL — AB (ref 4.0–10.5)

## 2016-08-19 LAB — LIPASE, BLOOD: Lipase: 16 U/L (ref 11–51)

## 2016-08-19 LAB — I-STAT TROPONIN, ED: TROPONIN I, POC: 0.03 ng/mL (ref 0.00–0.08)

## 2016-08-19 LAB — C DIFFICILE QUICK SCREEN W PCR REFLEX
C Diff antigen: POSITIVE — AB
C Diff interpretation: DETECTED
C Diff toxin: POSITIVE — AB

## 2016-08-19 LAB — I-STAT CG4 LACTIC ACID, ED: Lactic Acid, Venous: 1.31 mmol/L (ref 0.5–1.9)

## 2016-08-19 MED ORDER — MORPHINE SULFATE (PF) 4 MG/ML IV SOLN
4.0000 mg | Freq: Once | INTRAVENOUS | Status: AC
  Administered 2016-08-19: 4 mg via INTRAVENOUS
  Filled 2016-08-19: qty 1

## 2016-08-19 MED ORDER — PROMETHAZINE HCL 25 MG/ML IJ SOLN: 12.5000 mg | Freq: Four times a day (QID) | INTRAMUSCULAR | Status: DC | PRN

## 2016-08-19 MED ORDER — DIPHENOXYLATE-ATROPINE 2.5-0.025 MG PO TABS
2.0000 | ORAL_TABLET | Freq: Once | ORAL | Status: AC
  Administered 2016-08-19: 2 via ORAL
  Filled 2016-08-19: qty 2

## 2016-08-19 MED ORDER — SODIUM CHLORIDE 0.9 % IV SOLN
INTRAVENOUS | Status: DC
  Administered 2016-08-19 – 2016-08-22 (×8): via INTRAVENOUS

## 2016-08-19 MED ORDER — ENOXAPARIN SODIUM 40 MG/0.4ML ~~LOC~~ SOLN
40.0000 mg | SUBCUTANEOUS | Status: DC
  Administered 2016-08-19 – 2016-08-21 (×3): 40 mg via SUBCUTANEOUS
  Filled 2016-08-19 (×3): qty 0.4

## 2016-08-19 MED ORDER — SODIUM CHLORIDE 0.9 % IV SOLN
8.0000 mg | Freq: Once | INTRAVENOUS | Status: AC
  Administered 2016-08-19: 8 mg via INTRAVENOUS
  Filled 2016-08-19: qty 4

## 2016-08-19 MED ORDER — PROMETHAZINE HCL 25 MG RE SUPP: 12.5000 mg | Freq: Four times a day (QID) | RECTAL | Status: DC | PRN

## 2016-08-19 MED ORDER — ACETAMINOPHEN 325 MG PO TABS
650.0000 mg | ORAL_TABLET | Freq: Four times a day (QID) | ORAL | Status: DC | PRN
  Administered 2016-08-20: 650 mg via ORAL
  Filled 2016-08-19: qty 2

## 2016-08-19 MED ORDER — LORAZEPAM 1 MG PO TABS: 2.0000 mg | ORAL_TABLET | Freq: Once | ORAL | Status: DC

## 2016-08-19 MED ORDER — METHOCARBAMOL 500 MG PO TABS: 500.0000 mg | ORAL_TABLET | Freq: Three times a day (TID) | ORAL | Status: DC | PRN

## 2016-08-19 MED ORDER — CALCIUM CARBONATE-VITAMIN D 500-200 MG-UNIT PO TABS
1.0000 | ORAL_TABLET | Freq: Every day | ORAL | Status: DC
  Filled 2016-08-19 (×5): qty 1

## 2016-08-19 MED ORDER — METOPROLOL SUCCINATE ER 25 MG PO TB24
12.5000 mg | ORAL_TABLET | Freq: Every day | ORAL | Status: DC
  Administered 2016-08-19 – 2016-08-22 (×4): 12.5 mg via ORAL
  Filled 2016-08-19 (×4): qty 1

## 2016-08-19 MED ORDER — ONDANSETRON HCL 4 MG/2ML IJ SOLN: 4.0000 mg | Freq: Four times a day (QID) | INTRAMUSCULAR | Status: DC | PRN

## 2016-08-19 MED ORDER — ONDANSETRON HCL 4 MG PO TABS: 4.0000 mg | ORAL_TABLET | Freq: Four times a day (QID) | ORAL | Status: DC | PRN

## 2016-08-19 MED ORDER — POTASSIUM CHLORIDE CRYS ER 20 MEQ PO TBCR
40.0000 meq | EXTENDED_RELEASE_TABLET | Freq: Once | ORAL | Status: DC
  Filled 2016-08-19: qty 2

## 2016-08-19 MED ORDER — MORPHINE SULFATE (PF) 2 MG/ML IV SOLN: 2.0000 mg | INTRAVENOUS | Status: DC | PRN

## 2016-08-19 MED ORDER — LEVOFLOXACIN IN D5W 500 MG/100ML IV SOLN
500.0000 mg | Freq: Once | INTRAVENOUS | Status: AC
  Administered 2016-08-19: 500 mg via INTRAVENOUS
  Filled 2016-08-19: qty 100

## 2016-08-19 MED ORDER — VANCOMYCIN 50 MG/ML ORAL SOLUTION
125.0000 mg | Freq: Four times a day (QID) | ORAL | Status: DC
  Administered 2016-08-19 – 2016-08-22 (×13): 125 mg via ORAL
  Filled 2016-08-19 (×15): qty 2.5

## 2016-08-19 MED ORDER — ATORVASTATIN CALCIUM 10 MG PO TABS
20.0000 mg | ORAL_TABLET | Freq: Every day | ORAL | Status: DC
  Administered 2016-08-19 – 2016-08-21 (×2): 20 mg via ORAL
  Administered 2016-08-22: 10 mg via ORAL
  Filled 2016-08-19 (×4): qty 2

## 2016-08-19 MED ORDER — VITAMIN D 1000 UNITS PO TABS
1000.0000 [IU] | ORAL_TABLET | Freq: Every day | ORAL | Status: DC
  Administered 2016-08-21 – 2016-08-22 (×2): 1000 [IU] via ORAL
  Filled 2016-08-19 (×4): qty 1

## 2016-08-19 MED ORDER — SODIUM CHLORIDE 0.9 % IV SOLN
30.0000 meq | Freq: Once | INTRAVENOUS | Status: AC
  Administered 2016-08-19: 30 meq via INTRAVENOUS
  Filled 2016-08-19: qty 15

## 2016-08-19 MED ORDER — LEVOTHYROXINE SODIUM 150 MCG PO TABS
150.0000 ug | ORAL_TABLET | ORAL | Status: DC
  Administered 2016-08-20 – 2016-08-22 (×2): 150 ug via ORAL
  Filled 2016-08-19 (×2): qty 1

## 2016-08-19 MED ORDER — ACETAMINOPHEN 650 MG RE SUPP: 650.0000 mg | Freq: Four times a day (QID) | RECTAL | Status: DC | PRN

## 2016-08-19 MED ORDER — SODIUM CHLORIDE 0.9 % IV BOLUS (SEPSIS)
2000.0000 mL | Freq: Once | INTRAVENOUS | Status: AC
  Administered 2016-08-19: 2000 mL via INTRAVENOUS

## 2016-08-19 MED ORDER — ALBUTEROL SULFATE (2.5 MG/3ML) 0.083% IN NEBU: 2.5000 mg | INHALATION_SOLUTION | Freq: Four times a day (QID) | RESPIRATORY_TRACT | Status: DC | PRN

## 2016-08-19 MED ORDER — ASPIRIN EC 81 MG PO TBEC
81.0000 mg | DELAYED_RELEASE_TABLET | Freq: Every day | ORAL | Status: DC
  Administered 2016-08-20 – 2016-08-22 (×3): 81 mg via ORAL
  Filled 2016-08-19 (×4): qty 1

## 2016-08-19 MED ORDER — ONDANSETRON HCL 4 MG/2ML IJ SOLN
4.0000 mg | Freq: Once | INTRAMUSCULAR | Status: AC | PRN
  Administered 2016-08-19: 4 mg via INTRAVENOUS
  Filled 2016-08-19: qty 2

## 2016-08-19 MED ORDER — METRONIDAZOLE 500 MG PO TABS
500.0000 mg | ORAL_TABLET | Freq: Once | ORAL | Status: DC
  Filled 2016-08-19: qty 1

## 2016-08-19 MED ORDER — PROMETHAZINE HCL 25 MG PO TABS
12.5000 mg | ORAL_TABLET | Freq: Four times a day (QID) | ORAL | Status: DC | PRN
Start: 2016-08-19 — End: 2016-08-22

## 2016-08-19 MED ORDER — MONTELUKAST SODIUM 10 MG PO TABS
10.0000 mg | ORAL_TABLET | Freq: Every day | ORAL | Status: DC
  Administered 2016-08-19 – 2016-08-21 (×3): 10 mg via ORAL
  Filled 2016-08-19 (×3): qty 1

## 2016-08-19 MED ORDER — ONDANSETRON 8 MG PO TBDP: 8.0000 mg | ORAL_TABLET | Freq: Once | ORAL | Status: DC

## 2016-08-19 NOTE — ED Provider Notes (Signed)
[facility] DEPT Provider Note   CSN: [id number] Arrival date & time: 08/19/16  0730     History   Chief Complaint Chief Complaint  Patient presents with  . Weakness  . Cancer    HPI Kimberly [name] is a 75 y.o. female. She presents with weakness, nausea, and diarrhea. Patient is receiving chemotherapy for recurrent lung cancer. Her first cycle of chemotherapy aches on December 11, 5 days ago on Monday. Has had nausea since that time. Had diarrhea for the first 3-4 days. Saw some improvement. Then recurred about 36 hours, and has been persistent. Received IV fluids at the cancer center yesterday. Continue diarrhea last night, and early this morning she presents here weak.  Currently undergoing Taxol/carboplatin chemotherapy regimen.     HPI  Past Medical History:  Diagnosis Date  . Anxiety    due to surgery   . Aortic insufficiency   . Arthritis   . Atrial fibrillation ([place])   . Back pain 08/14/2016  . Bronchitis   . Cough    dry, endobronchial mass  . Dyslipidemia   . GERD (gastroesophageal reflux disease)   . Heart murmur   . History of nuclear stress test 02/26/2006   exercise myoview; normal pattern of perfusion; low risk scan   . Hypertension   . Hypothyroidism   . Mitral insufficiency   . Pneumonia    pus bronchitis  . PONV (postoperative nausea and vomiting)   . Radiation 12/01/13-01/08/14   50.4 gray to right central chest  . Shortness of breath    with exertion  . 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 Syncope    "states she has passed out a few times. dr is trying to find cause.last time when geeting ready to go home after video bronch/bx  . Thyroid disease     Patient Active Problem List   Diagnosis Date Noted  . Dehydration 08/18/2016  . Encounter for antineoplastic chemotherapy 08/14/2016  . Back pain 08/14/2016  . Poor venous access 08/14/2016  . Other fatigue 05/03/2016  . Patient unable to exercise 05/03/2016  . Symptomatic anemia  04/02/2014  . Diarrhea 02/27/2014  . Hyponatremia 02/27/2014  . Anemia 02/27/2014  . Atrial fibrillation with rapid ventricular response ([place]) 02/07/2014    Class: Acute  . Pleuritic chest pain 02/07/2014  . Atrial fibrillation ([place]) 02/07/2014  . Colitis 01/11/2014  . Bloody diarrhea 01/11/2014  . Lung cancer, middle lobe ([place]) 11/13/2013  . Hemoptysis 09/19/2013  . Collapse of right lung: RML 09/19/2013  . Bronchial obstruction 09/19/2013  . Leukocytosis 09/19/2013  . Obstructive pneumonia 09/19/2013  . Amaurosis fugax 05/23/2013  . Syncope 04/19/2013  . Hyperlipidemia 08/17/2008  . Essential hypertension 08/17/2008  . ALLERGIC RHINITIS 08/17/2008  . ASTHMA 08/17/2008  . SHORTNESS OF BREATH (SOB) 08/17/2008    Past Surgical History:  Procedure Laterality Date  . CARDIAC CATHETERIZATION  07/08/2008   normal coronaries  . CARPAL TUNNEL RELEASE Right 1988  . COLONOSCOPY N/A 02/11/2016   Procedure: COLONOSCOPY;  Surgeon: Carol Ada, MD;  Location: [facility];  Service: Endoscopy;  Laterality: N/A;  . DILATION AND CURETTAGE OF UTERUS    . EYE SURGERY Bilateral   . H/O MET Test w/PFT  04/02/2012   low risk; peak VO2 77% predicted  . JOINT REPLACEMENT  2003   thumb rt  . KNEE ARTHROSCOPY  12   rt meniscus  . MEDIASTINOSCOPY N/A 10/30/2013   Procedure: MEDIASTINOSCOPY;  Surgeon: [place] Nakayama, MD;  Location: [facility];  Service: Thoracic;  Laterality: N/A;  . ROTATOR CUFF REPAIR  2006   ? side  . THYROIDECTOMY  1973  . TRANSTHORACIC ECHOCARDIOGRAM  09/25/2012   EF 55-60%; mild LVH & mild concentric hypertrophy; mild AV regurg; RV systolic pressure increase consistent with mild pulm HTN  . VIDEO ASSISTED THORACOSCOPY (VATS)/ LOBECTOMY Right 11/13/2013   Procedure: VIDEO ASSISTED THORACOSCOPY (VATS) with mediastinal  biopsies;  Surgeon: [place] Nakayama, MD;  Location: [facility];  Service: Thoracic;  Laterality: Right;  RIGHT VATS,mediastinal biopsies  . VIDEO BRONCHOSCOPY  Bilateral 09/25/2013   Procedure: VIDEO BRONCHOSCOPY WITHOUT FLUORO;  Surgeon: Tanda Rockers, MD;  Location: [facility];  Service: Cardiopulmonary;  Laterality: Bilateral;  . VIDEO BRONCHOSCOPY WITH ENDOBRONCHIAL ULTRASOUND N/A 10/30/2013   Procedure: VIDEO BRONCHOSCOPY WITH ENDOBRONCHIAL ULTRASOUND;  Surgeon: Mel[place] Nakayama, MD;  Location: [facility];  Service: Thoracic;  Laterality: N/A;    OB History    No data available       Home Medications    Prior to Admission medications   Medication Sig Start Date End Date Taking? Authorizing Provider  albuterol (PROVENTIL HFA;VENTOLIN HFA) 108 (90 BASE) MCG/ACT inhaler Inhale 1 puff into the lungs every 6 (six) hours as needed for wheezing or shortness of breath.    Yes Historical Provider, MD  amoxicillin-clavulanate (AUGMENTIN) 875-125 MG tablet Take 1 tablet by mouth 2 (two) times daily. 08/10/16  Yes Historical Provider, MD  aspirin EC 81 MG tablet Take 81 mg by mouth daily.   Yes Historical Provider, MD  atorvastatin (LIPITOR) 20 MG tablet Take 20 mg by mouth every morning.    Yes Historical Provider, MD  Calcium Carb-Cholecalciferol (CALCIUM 600 + D PO) Take 1 tablet by mouth daily.   Yes Historical Provider, MD  chlorthalidone (HYGROTON) 25 MG tablet Take 1 tablet (25 mg total) by mouth daily. 09/28/15  Yes Pixie Casino, MD  Cholecalciferol (VITAMIN D3) 1000 units CAPS Take 1,000 Units by mouth daily.   Yes Historical Provider, MD  diphenoxylate-atropine (LOMOTIL) 2.5-0.025 MG tablet Take 2 tablets by mouth 4 (four) times daily as needed for diarrhea or loose stools. 08/18/16  Yes Susanne Borders, NP  levothyroxine (SYNTHROID, LEVOTHROID) 150 MCG tablet Take 150 mcg by mouth as directed. Take 1 tablet once every Tuesday, Thursday, Saturday, and Sunday.   Yes Historical Provider, MD  lidocaine-prilocaine (EMLA) cream Apply 1 application topically as needed. 08/18/16  Yes Susanne Borders, NP  loperamide (IMODIUM) 2 MG capsule Take 2 mg by  mouth as needed for diarrhea or loose stools.   Yes Historical Provider, MD  meloxicam (MOBIC) 7.5 MG tablet Take 7.5 mg by mouth 2 (two) times daily as needed for pain.   Yes Historical Provider, MD  methocarbamol (ROBAXIN) 500 MG tablet Take 500 mg by mouth every 8 (eight) hours as needed for muscle spasms.   Yes Historical Provider, MD  montelukast (SINGULAIR) 10 MG tablet Take 10 mg by mouth at bedtime.  04/15/13  Yes Historical Provider, MD  Multiple Vitamins-Iron (MULTIVITAMIN/IRON PO) Take 1 tablet by mouth daily.    Yes Historical Provider, MD  ondansetron (ZOFRAN) 8 MG tablet Take 1 tablet (8 mg total) by mouth every 8 (eight) hours as needed for refractory nausea / vomiting (start on Day 3 after each treatment). 08/14/16  Yes Curt Bears, MD  prochlorperazine (COMPAZINE) 10 MG tablet Take 1 tablet (10 mg total) by mouth every 6 (six) hours as needed for nausea or vomiting.  08/14/16  Yes Curt Bears, MD  metoprolol succinate (TOPROL-XL) 25 MG 24 hr tablet Take one-half tablet by  mouth daily 01/14/16   Pixie Casino, MD    Family History Family History  Problem Relation Age of Onset  . Lung cancer Mother   . Heart attack Father   . Heart failure Maternal Grandfather   . Heart attack Paternal Grandfather     Social History Social History  Substance Use Topics  . Smoking status: Former Smoker    Packs/day: 0.50    Years: 6.00    Types: Cigarettes    Quit date: 09/05/1967  . Smokeless tobacco: Never Used     Comment: occ wine  . Alcohol use Yes     Comment: occasional 1-2 drinks/month     Allergies   Amiodarone and Milk-related compounds   Review of Systems Review of Systems  Constitutional: Negative for appetite change, chills, diaphoresis, fatigue and fever.  HENT: Negative for mouth sores, sore throat and trouble swallowing.   Eyes: Negative for visual disturbance.  Respiratory: Negative for cough, chest tightness, shortness of breath and wheezing.     Cardiovascular: Negative for chest pain.  Gastrointestinal: Positive for diarrhea, nausea and vomiting. Negative for abdominal distention and abdominal pain.  Endocrine: Negative for polydipsia, polyphagia and polyuria.  Genitourinary: Positive for decreased urine volume. Negative for dysuria, frequency and hematuria.  Musculoskeletal: Negative for gait problem.  Skin: Negative for color change, pallor and rash.  Neurological: Positive for weakness. Negative for dizziness, syncope, light-headedness and headaches.  Hematological: Does not bruise/bleed easily.  Psychiatric/Behavioral: Negative for behavioral problems and confusion.     Physical Exam Updated Vital Signs BP (!) 169/105   Pulse 102   Temp 98 F (36.7 C) (Oral)   Resp (!) 29   Ht 4' 10.5" (1.486 m)   Wt 161 lb (73 kg)   SpO2 98%   BMI 33.08 kg/m   Physical Exam  Constitutional: She is oriented to person, place, and time. She appears well-developed and well-nourished. No distress.  HENT:  Head: Normocephalic.  Because membranes of the mouth are dry. Conjunctiva are not pale.  Eyes: Conjunctivae are normal. Pupils are equal, round, and reactive to light. No scleral icterus.  Neck: Normal range of motion. Neck supple. No thyromegaly present.  Cardiovascular: Normal rate and regular rhythm.  Exam reveals no gallop and no friction rub.   No murmur heard. Pulmonary/Chest: Effort normal and breath sounds normal. No respiratory distress. She has no wheezes. She has no rales.  Abdominal: Soft. Bowel sounds are normal. She exhibits no distension. There is no tenderness. There is no rebound.  Normal active bowel sounds. No tenderness. Patient denies any abdominal pain.  Musculoskeletal: Normal range of motion.  Neurological: She is alert and oriented to person, place, and time.  Skin: Skin is warm and dry. No rash noted.  Psychiatric: She has a normal mood and affect. Her behavior is normal.     ED Treatments / Results   Labs (all labs ordered are listed, but only abnormal results are displayed) Labs Reviewed  C DIFFICILE QUICK SCREEN W PCR REFLEX - Abnormal; Notable for the following:       Result Value   C Diff antigen POSITIVE (*)    C Diff toxin POSITIVE (*)    All other components within normal limits  CBC - Abnormal; Notable for the following:    WBC 11.5 (*)    RBC 3.42 (*)    Hemoglobin  11.1 (*)    HCT 31.4 (*)    All other components within normal limits  COMPREHENSIVE METABOLIC PANEL - Abnormal; Notable for the following:    Sodium 131 (*)    Potassium 3.0 (*)    Chloride 94 (*)    Glucose, Bld 143 (*)    BUN 22 (*)    Creatinine, Ser 1.16 (*)    Calcium 8.4 (*)    GFR calc non Af Amer 46 (*)    GFR calc Af Amer 53 (*)    All other components within normal limits  CULTURE, BLOOD (SINGLE)  LIPASE, BLOOD  URINALYSIS, ROUTINE W REFLEX MICROSCOPIC  DIFFERENTIAL  I-STAT TROPOININ, ED  I-STAT CG4 LACTIC ACID, ED  I-STAT CG4 LACTIC ACID, ED    EKG  EKG Interpretation  Date/Time:  Saturday August 19 2016 07:40:14 EST Ventricular Rate:  88 PR Interval:    QRS Duration: 92 QT Interval:  395 QTC Calculation: 478 R Axis:   -12 Text Interpretation:  Sinus rhythm Low voltage, precordial leads Anteroseptal infarct, old Confirmed by Jeneen Rinks  MD, [place] ([id number]) on 08/19/2016 8:19:56 AM Also confirmed by Jeneen Rinks  MD, [place] ([id number]), editor [place], Leda Gauze [phone])  on 08/19/2016 9:56:34 AM       Radiology Dg Chest 2 View  Result Date: 08/19/2016 CLINICAL DATA:  Fever, 5 days duration. Being treated for recurrent lung cancer. EXAM: CHEST  2 VIEW COMPARISON:  07/05/2016 FINDINGS: Left chest remains clear. There is newly seen infiltrate in the right lung, particularly in the upper lung, consistent with bronchopneumonia superimposed upon treatment changes, tumor and volume loss. IMPRESSION: Increased density in the right lung probably representing pneumonia superimposed upon treatment changes,  tumor and volume loss. Electronically Signed   By: Nelson Chimes M.D.   On: 08/19/2016 08:22    Procedures Procedures (including critical care time)  Medications Ordered in ED Medications  ondansetron (ZOFRAN) injection 4 mg (not administered)  levofloxacin (LEVAQUIN) IVPB 500 mg (500 mg Intravenous New Bag/Given 08/19/16 1044)  potassium chloride SA (K-DUR,KLOR-CON) CR tablet 40 mEq (0 mEq Oral Hold 08/19/16 1044)  metroNIDAZOLE (FLAGYL) tablet 500 mg (0 mg Oral Hold 08/19/16 1044)  potassium chloride 30 mEq in sodium chloride 0.9 % 265 mL (KCL MULTIRUN) IVPB (not administered)  ondansetron (ZOFRAN) 8 mg in sodium chloride 0.9 % 50 mL IVPB (8 mg Intravenous Given 08/19/16 0924)  morphine 4 MG/ML injection 4 mg (4 mg Intravenous Given 08/19/16 0847)  diphenoxylate-atropine (LOMOTIL) 2.5-0.025 MG per tablet 2 tablet (2 tablets Oral Given 08/19/16 0847)  sodium chloride 0.9 % bolus 2,000 mL (0 mLs Intravenous Stopped 08/19/16 1110)     Initial Impression / Assessment and Plan / ED Course  I have reviewed the triage vital signs and the nursing notes.  Pertinent labs & imaging results that were available during my care of the patient were reviewed by me and considered in my medical decision making (see chart for details).  Clinical Course     Of note, per the nurse practitioner's note from her oncology visit she just recently finished Augmentin for recent sinusitis. If if patient produces to the emergency room sent for C. difficile testing. Otherwise plan IV fluids and symptomatically treatment. Lab evaluation. Reevaluation after the above.  11:12:  Patient noted to have right upper lobe pneumonia which is new. This is distal to her known cancerous lesion. She does have chronic cough. Has not noticed changes with being more short of breath. No hemoptysis. 7.5. Differential pending.  Positive for C. difficile. Patient given by mouth potassium and some clear liquids. Has vomiting. Additional  diarrhea. Will discussed with hospitalist regarding admission this patient will require by mouth antibiotics, and potassium if discharge. Not tolerating clear liquids here.  Final Clinical Impressions(s) / ED Diagnoses   Final diagnoses:  Malignant neoplasm of right lung, unspecified part of lung ([place])  Community acquired pneumonia of right upper lobe of lung ([facility])  C. difficile colitis  Nausea and vomiting, intractability of vomiting not specified, unspecified vomiting type  Diarrhea, unspecified type    New Prescriptions New Prescriptions   No medications on file     Tanna Furry, MD 08/19/16 1115

## 2016-08-19 NOTE — ED Triage Notes (Signed)
Pt complaint of worsening n/v/d with "heart burn" since chemotherapy Monday. Pt hx of lung cancer.

## 2016-08-19 NOTE — H&P (Addendum)
History and Physical    Kimberly [name]:[id number] DOB: 1941/04/04 DOA: 08/19/2016  PCP: [name], MD Patient coming from: Home  Chief Complaint: Nausea and vomiting and diarrhea  HPI: Kimberly [name] is a 75 y.o. female with medical history significant of lung cancer, atrial fibrillation, hypertension, asthma. Symptoms started 6 days ago after her chemotherapy and lasted about one day with nausea, vomiting, diarrhea. Unfortunately, 3 days later, symptoms restarted and persisted. The next day (yesterday), she went to the cancer center for IV fluids which did not help her symptoms. She has not been able to keep food or fluids down. No known sick contacts.  ED Course: Vitals: Afebrile. Normal pulses. Normal respirations. Hypertensive. On room air. Labs: Sodium of 131. Potassium 3.0. Creatinine of 1.16. Lactic acid of 1.31. WBC of 11.5K down from 22.6K. ANC is 10.1 Imaging: Chest x-ray significant for increased density in right lung concerning for possible pneumonia superimposed on treatment/tumor changes. Medications/Course: Metronidazole ordered but not given. IV fluids. Morphine. Zofran. Potassium.  Review of Systems: Review of Systems  Constitutional: Negative for chills, fever and malaise/fatigue.  Respiratory: Positive for cough (chronic). Negative for hemoptysis, sputum production, shortness of breath and wheezing.   Cardiovascular: Negative for chest pain, palpitations and leg swelling.  Gastrointestinal: Positive for diarrhea, nausea and vomiting. Negative for abdominal pain, blood in stool, constipation and melena.  Neurological: Negative for headaches.  All other systems reviewed and are negative.   Past Medical History:  Diagnosis Date  . Anxiety    due to surgery   . Aortic insufficiency   . Arthritis   . Atrial fibrillation ([place])   . Back pain 08/14/2016  . Bronchitis   . Cough    dry, endobronchial mass  . Dyslipidemia   . GERD (gastroesophageal  reflux disease)   . Heart murmur   . History of nuclear stress test 02/26/2006   exercise myoview; normal pattern of perfusion; low risk scan   . Hypertension   . Hypothyroidism   . Mitral insufficiency   . Pneumonia    pus bronchitis  . PONV (postoperative nausea and vomiting)   . Radiation 12/01/13-01/08/14   50.4 gray to right central chest  . Shortness of breath    with exertion  . 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 Syncope    "states she has passed out a few times. dr is trying to find cause.last time when geeting ready to go home after video bronch/bx  . Thyroid disease     Past Surgical History:  Procedure Laterality Date  . CARDIAC CATHETERIZATION  07/08/2008   normal coronaries  . CARPAL TUNNEL RELEASE Right 1988  . COLONOSCOPY N/A 02/11/2016   Procedure: COLONOSCOPY;  Surgeon: Carol Ada, MD;  Location: [facility];  Service: Endoscopy;  Laterality: N/A;  . DILATION AND CURETTAGE OF UTERUS    . EYE SURGERY Bilateral   . H/O MET Test w/PFT  04/02/2012   low risk; peak VO2 77% predicted  . JOINT REPLACEMENT  2003   thumb rt  . KNEE ARTHROSCOPY  12   rt meniscus  . MEDIASTINOSCOPY N/A 10/30/2013   Procedure: MEDIASTINOSCOPY;  Surgeon: Melrose Nakayama, MD;  Location: [place];  Service: Thoracic;  Laterality: N/A;  . ROTATOR CUFF REPAIR  2006   ? side  . THYROIDECTOMY  1973  . TRANSTHORACIC ECHOCARDIOGRAM  09/25/2012   EF 55-60%; mild LVH & mild concentric hypertrophy; mild AV regurg; RV systolic pressure increase consistent with mild  pulm HTN  . VIDEO ASSISTED THORACOSCOPY (VATS)/ LOBECTOMY Right 11/13/2013   Procedure: VIDEO ASSISTED THORACOSCOPY (VATS) with mediastinal  biopsies;  Surgeon: Melrose Nakayama, MD;  Location: [facility];  Service: Thoracic;  Laterality: Right;  RIGHT VATS,mediastinal biopsies  . VIDEO BRONCHOSCOPY Bilateral 09/25/2013   Procedure: VIDEO BRONCHOSCOPY WITHOUT FLUORO;  Surgeon: Tanda Rockers, MD;  Location: [facility];  Service:  Cardiopulmonary;  Laterality: Bilateral;  . VIDEO BRONCHOSCOPY WITH ENDOBRONCHIAL ULTRASOUND N/A 10/30/2013   Procedure: VIDEO BRONCHOSCOPY WITH ENDOBRONCHIAL ULTRASOUND;  Surgeon: Melrose Nakayama, MD;  Location: [place];  Service: Thoracic;  Laterality: N/A;     reports that she quit smoking about 48 years ago. Her smoking use included Cigarettes. She has a 3.00 pack-year smoking history. She has never used smokeless tobacco. She reports that she drinks alcohol. She reports that she does not use drugs.  Allergies  Allergen Reactions  . Amiodarone Other (See Comments)    Corneal deposits  . Milk-Related Compounds Other (See Comments)    GI upset, projectile vomiting - can't take any dairy products    Family History  Problem Relation Age of Onset  . Lung cancer Mother   . Heart attack Father   . Heart failure Maternal Grandfather   . Heart attack Paternal Grandfather     Prior to Admission medications   Medication Sig Start Date End Date Taking? Authorizing Provider  albuterol (PROVENTIL HFA;VENTOLIN HFA) 108 (90 BASE) MCG/ACT inhaler Inhale 1 puff into the lungs every 6 (six) hours as needed for wheezing or shortness of breath.    Yes Historical Provider, MD  amoxicillin-clavulanate (AUGMENTIN) 875-125 MG tablet Take 1 tablet by mouth 2 (two) times daily. 08/10/16  Yes Historical Provider, MD  aspirin EC 81 MG tablet Take 81 mg by mouth daily.   Yes Historical Provider, MD  atorvastatin (LIPITOR) 20 MG tablet Take 20 mg by mouth every morning.    Yes Historical Provider, MD  Calcium Carb-Cholecalciferol (CALCIUM 600 + D PO) Take 1 tablet by mouth daily.   Yes Historical Provider, MD  chlorthalidone (HYGROTON) 25 MG tablet Take 1 tablet (25 mg total) by mouth daily. 09/28/15  Yes Pixie Casino, MD  Cholecalciferol (VITAMIN D3) 1000 units CAPS Take 1,000 Units by mouth daily.   Yes Historical Provider, MD  diphenoxylate-atropine (LOMOTIL) 2.5-0.025 MG tablet Take 2 tablets by mouth 4  (four) times daily as needed for diarrhea or loose stools. 08/18/16  Yes Susanne Borders, NP  levothyroxine (SYNTHROID, LEVOTHROID) 150 MCG tablet Take 150 mcg by mouth as directed. Take 1 tablet once every Tuesday, Thursday, Saturday, and Sunday.   Yes Historical Provider, MD  lidocaine-prilocaine (EMLA) cream Apply 1 application topically as needed. 08/18/16  Yes Susanne Borders, NP  loperamide (IMODIUM) 2 MG capsule Take 2 mg by mouth as needed for diarrhea or loose stools.   Yes Historical Provider, MD  meloxicam (MOBIC) 7.5 MG tablet Take 7.5 mg by mouth 2 (two) times daily as needed for pain.   Yes Historical Provider, MD  methocarbamol (ROBAXIN) 500 MG tablet Take 500 mg by mouth every 8 (eight) hours as needed for muscle spasms.   Yes Historical Provider, MD  montelukast (SINGULAIR) 10 MG tablet Take 10 mg by mouth at bedtime.  04/15/13  Yes Historical Provider, MD  Multiple Vitamins-Iron (MULTIVITAMIN/IRON PO) Take 1 tablet by mouth daily.    Yes Historical Provider, MD  ondansetron (ZOFRAN) 8 MG tablet Take 1 tablet (8 mg total) by mouth  every 8 (eight) hours as needed for refractory nausea / vomiting (start on Day 3 after each treatment). 08/14/16  Yes Curt Bears, MD  prochlorperazine (COMPAZINE) 10 MG tablet Take 1 tablet (10 mg total) by mouth every 6 (six) hours as needed for nausea or vomiting. 08/14/16  Yes Curt Bears, MD  metoprolol succinate (TOPROL-XL) 25 MG 24 hr tablet Take one-half tablet by  mouth daily 01/14/16   Pixie Casino, MD    Physical Exam: Vitals:   08/19/16 1100 08/19/16 1115 08/19/16 1130 08/19/16 1153  BP: (!) 169/105  191/86   Pulse: 102 83  88  Resp: (!) '29 10 13 19  ' Temp:      TempSrc:      SpO2: 98% 100%  100%  Weight:      Height:         Constitutional: NAD, calm, comfortable Eyes: PERRL, lids and conjunctivae normal ENMT: Mucous membranes are moist. Posterior pharynx clear of any exudate or lesions.Normal dentition.  Neck: normal,  supple, no masses, no thyromegaly Respiratory: clear to auscultation bilaterally, no wheezing, no crackles. Normal respiratory effort. No accessory muscle use.  Cardiovascular: Regular rate and rhythm, no murmurs / rubs / gallops. No extremity edema. 2+ pedal pulses. Abdomen: no tenderness, no masses palpated. No hepatosplenomegaly. Bowel sounds positive.  Musculoskeletal: no clubbing / cyanosis. No joint deformity upper and lower extremities. Good ROM, no contractures. Normal muscle tone.  Skin: ecchymotic rash on right distal forearm. No ulcers Neurologic: CN 2-12 grossly intact. Sensation intact, DTR normal. Strength 5/5 in all 4.  Psychiatric: Normal judgment and insight. Alert and oriented x 3. Normal mood.   Labs on Admission: I have personally reviewed following labs and imaging studies  CBC:  Recent Labs Lab 08/14/16 1038 08/18/16 1415 08/19/16 0801  WBC 10.6* 22.6* 11.5*  NEUTROABS 8.4* 21.2*  --   HGB 12.0 11.9 11.1*  HCT 35.7 34.9 31.4*  MCV 93.5 92.6 91.8  PLT 277 201 286   Basic Metabolic Panel:  Recent Labs Lab 08/14/16 1038 08/18/16 1415 08/19/16 0801  NA 136 135* 131*  K 3.6 4.0 3.0*  CL  --   --  94*  CO2 '26 29 26  ' GLUCOSE 110 134 143*  BUN 17.6 26.2* 22*  CREATININE 1.0 1.3* 1.16*  CALCIUM 9.9 9.6 8.4*  MG  --  1.4*  --    GFR: Estimated Creatinine Clearance: 37.2 mL/min (by C-G formula based on SCr of 1.16 mg/dL (H)).   Liver Function Tests:  Recent Labs Lab 08/14/16 1038 08/18/16 1415 08/19/16 0801  AST 19 34 35  ALT '17 28 28  ' ALKPHOS 98 114 85  BILITOT 0.51 1.05 1.0  PROT 7.1 7.0 6.5  ALBUMIN 3.5 3.5 3.6    Recent Labs Lab 08/19/16 0801  LIPASE 16   Urine analysis:    Component Value Date/Time   COLORURINE AMBER (A) 02/27/2014 1314   APPEARANCEUR CLEAR 02/27/2014 1314   LABSPEC 1.012 02/27/2014 1314   PHURINE 5.5 02/27/2014 1314   GLUCOSEU NEGATIVE 02/27/2014 1314   HGBUR NEGATIVE 02/27/2014 1314   BILIRUBINUR NEGATIVE  02/27/2014 1314   KETONESUR NEGATIVE 02/27/2014 1314   PROTEINUR NEGATIVE 02/27/2014 1314   UROBILINOGEN 0.2 02/27/2014 1314   NITRITE NEGATIVE 02/27/2014 1314   LEUKOCYTESUR NEGATIVE 02/27/2014 1314    Recent Results (from the past 240 hour(s))  C difficile quick scan w PCR reflex     Status: Abnormal   Collection Time: 08/19/16  8:28 AM  Result  Value Ref Range Status   C Diff antigen POSITIVE (A) NEGATIVE Final   C Diff toxin POSITIVE (A) NEGATIVE Final   C Diff interpretation Toxin producing C. difficile detected.  Final    Comment: RESULT CALLED TO, READ BACK BY AND VERIFIED WITH: ROSSER,M RN AT 1034 ON 12.16.17 BY EPPERSON,S      Radiological Exams on Admission: Dg Chest 2 View  Result Date: 08/19/2016 CLINICAL DATA:  Fever, 5 days duration. Being treated for recurrent lung cancer. EXAM: CHEST  2 VIEW COMPARISON:  07/05/2016 FINDINGS: Left chest remains clear. There is newly seen infiltrate in the right lung, particularly in the upper lung, consistent with bronchopneumonia superimposed upon treatment changes, tumor and volume loss. IMPRESSION: Increased density in the right lung probably representing pneumonia superimposed upon treatment changes, tumor and volume loss. Electronically Signed   By: Nelson Chimes M.D.   On: 08/19/2016 08:22    EKG: Independently reviewed. Sinus rhythm  Assessment/Plan Principal Problem:   C. difficile colitis Active Problems:   Essential hypertension   Asthma   Lung cancer, middle lobe (HCC)   Paroxysmal atrial fibrillation (HCC)   Hyponatremia   Hypokalemia   C. Difficile colitis Positive antigen and toxin. Symptomatic. -Vancomycin by mouth -May need to switch to metronidazole IV if not able to tolerate vancomycin by mouth -IV fluids -Enteric precautions  Nausea and vomiting Likely secondary to acute illness -IV hydration -Zofran when necessary -Phenergan when necessary  Pulmonary infiltrate Discusses radiologist. Concern is  possible superimposed pneumonia as infiltrate appears worsened. Clinically, patient does not appear to have pneumonia. -Hold off on antibiotics for now -Monitor clinically  Hypokalemia Secondary to vomiting/diarrhea -replete -recheck BMP  Hyponatremia Secondary to volume loss from vomiting/diarrhea -IV hydration  Lung cancer Initially presented in 2015 status post chemotherapy and radiation therapy. She now has recurrence of her cancer and is currently undergoing chemotherapy. She is followed by Dr. Julien Nordmann  Hypothyroidism -Continue home thyroid regimen. If not tolerating by mouth we'll need to switch to IV administration  Hypertension Uncontrolled secondary to not taking medications -Continue home metoprolol succinate -Hold chlorthalidone in setting of dehydration  CKD stage III Stable  Paroxysmal atrial fibrillation CHA2DS2-VASc Score is 3. Not on anticoagulation. Taken off amiodarone in March secondary to corneal deposits. Currently in sinus rhythm and rate controlled  Asthma -continue albuterol prn   DVT prophylaxis: Lovenox subcutaneous Code Status: Full code Family Communication: Daughter at bedside Disposition Plan: Expect discharge home in 2-3 days Consults called: None Admission status: Inpatient, medical floor   Cordelia Poche, MD [facility] Pager [phone]  If 7PM-7AM, please contact night-coverage [email or web address] Password [facility]  08/19/2016, 12:24 PM

## 2016-08-19 NOTE — ED Notes (Signed)
Pt aware of need for urine sample. Unable to void at this time. Intervention: bedside commode placed in room.

## 2016-08-19 NOTE — ED Notes (Signed)
Blood cultures obtained prior to antibiotic administration.

## 2016-08-19 NOTE — ED Notes (Signed)
MD hospitalist at bedside.

## 2016-08-19 NOTE — ED Notes (Signed)
ED Provider at bedside. 

## 2016-08-19 NOTE — ED Notes (Signed)
Pt advised U/A needed per MD order. Pt states unable to urinate, will attempt later. Commode w/seat provided/placed at bedside. [employer]

## 2016-08-19 NOTE — ED Notes (Signed)
Patient transported to X-ray 

## 2016-08-19 NOTE — ED Notes (Signed)
Pt attempted to void. Still unable to provide urine sample. Intervention: 2 L NS fluids given. Commode at bedside. Pt informed of need for sample. Plan: reassess.

## 2016-08-20 DIAGNOSIS — C3490 Malignant neoplasm of unspecified part of unspecified bronchus or lung: Secondary | ICD-10-CM

## 2016-08-20 DIAGNOSIS — I480 Paroxysmal atrial fibrillation: Secondary | ICD-10-CM

## 2016-08-20 DIAGNOSIS — R112 Nausea with vomiting, unspecified: Secondary | ICD-10-CM

## 2016-08-20 LAB — CBC
HCT: 28 % — ABNORMAL LOW (ref 36.0–46.0)
HEMOGLOBIN: 9.6 g/dL — AB (ref 12.0–15.0)
MCH: 32.2 pg (ref 26.0–34.0)
MCHC: 34.3 g/dL (ref 30.0–36.0)
MCV: 94 fL (ref 78.0–100.0)
PLATELETS: 172 10*3/uL (ref 150–400)
RBC: 2.98 MIL/uL — AB (ref 3.87–5.11)
RDW: 13.5 % (ref 11.5–15.5)
WBC: 3 10*3/uL — ABNORMAL LOW (ref 4.0–10.5)

## 2016-08-20 LAB — BASIC METABOLIC PANEL
Anion gap: 5 (ref 5–15)
BUN: 12 mg/dL (ref 6–20)
CALCIUM: 7.8 mg/dL — AB (ref 8.9–10.3)
CO2: 27 mmol/L (ref 22–32)
CREATININE: 0.83 mg/dL (ref 0.44–1.00)
Chloride: 100 mmol/L — ABNORMAL LOW (ref 101–111)
Glucose, Bld: 85 mg/dL (ref 65–99)
Potassium: 3.3 mmol/L — ABNORMAL LOW (ref 3.5–5.1)
Sodium: 132 mmol/L — ABNORMAL LOW (ref 135–145)

## 2016-08-20 LAB — MAGNESIUM: MAGNESIUM: 1.1 mg/dL — AB (ref 1.7–2.4)

## 2016-08-20 MED ORDER — POTASSIUM CHLORIDE CRYS ER 20 MEQ PO TBCR
40.0000 meq | EXTENDED_RELEASE_TABLET | ORAL | Status: AC
  Administered 2016-08-20 (×2): 40 meq via ORAL
  Filled 2016-08-20 (×2): qty 2

## 2016-08-20 MED ORDER — AMLODIPINE BESYLATE 5 MG PO TABS
5.0000 mg | ORAL_TABLET | Freq: Every day | ORAL | Status: DC
  Administered 2016-08-20 – 2016-08-22 (×3): 5 mg via ORAL
  Filled 2016-08-20 (×3): qty 1

## 2016-08-20 MED ORDER — SACCHAROMYCES BOULARDII 250 MG PO CAPS
250.0000 mg | ORAL_CAPSULE | Freq: Two times a day (BID) | ORAL | Status: DC
  Administered 2016-08-20 – 2016-08-22 (×5): 250 mg via ORAL
  Filled 2016-08-20 (×5): qty 1

## 2016-08-20 MED ORDER — MAGNESIUM SULFATE 4 GM/100ML IV SOLN
4.0000 g | Freq: Once | INTRAVENOUS | Status: AC
  Administered 2016-08-20: 4 g via INTRAVENOUS
  Filled 2016-08-20: qty 100

## 2016-08-20 NOTE — Progress Notes (Addendum)
PROGRESS NOTE   Kimberly [name]  HBZ:[id number] DOB: 10/14/1940 DOA: 08/19/2016 PCP: [name], MD    Brief Narrative:  Kimberly [name] is a 75 y.o. female with medical history significant of lung cancer, atrial fibrillation, hypertension, asthma. Symptoms started 6 days ago after her chemotherapy and lasted about one day with nausea, vomiting, diarrhea. Unfortunately, 3 days later, symptoms restarted and persisted. The next day (yesterday), she went to the cancer center for IV fluids which did not help her symptoms. She has not been able to keep food or fluids down. No known sick contacts.   Assessment & Plan:   Principal Problem:   C. difficile colitis Active Problems:   Essential hypertension   Asthma   Lung cancer, middle lobe (HCC)   Paroxysmal atrial fibrillation (HCC)   Hyponatremia   Hypokalemia   Malignant neoplasm of right lung (HCC)   Nausea and vomiting   Hypomagnesemia  #1 C. difficile colitis Patient presented with nausea vomiting diarrhea with C. difficile PCR positive for antigen and toxin. Patient states still having numerous loose stools every 2 hours with some nausea however no further emesis. Patient also noted to have significant electrolyte abnormalities of hypokalemia and hypomagnesemia. Patient noted to have a history of lung cancer on chemotherapy and immunosuppressed. Patient currently on clear liquids. Continue IV fluid resuscitation. Continue oral vancomycin. Enteric precautions. Symptomatic and supportive care. Place on florastor. Follow.  #2 hypokalemia/hypomagnesemia/hyponatremia Secondary to GI losses. Replete. Continue IV fluid.  #3 hypertension Continue Toprol-XL. Start Norvasc 5 mg daily and up titrate as needed.  #4 hypothyroidism Continue home dose Synthroid.  #5 paroxysmal atrial fibrillation CHA2DS2VASC 3 Currently rate controlled on Toprol-XL. Aspirin. Patient was taken off amiodarone in March secondary to corneal  deposits.  #6 history of lung cancer Patient currently undergoing Taxol and carboplatin chemotherapy per oncology. Once C. difficile colitis has resolved patient will need to follow-up with oncology in the outpatient setting.  #7 dehydration IV fluids.  #8 pulmonary infiltrate Noted on chest x-ray. Patient however with no respiratory symptoms. Will monitor for now. If patient develops respiratory symptoms with worsening leukocytosis and fever may consider repeating chest x-ray.  #9 chronic kidney disease stage III Stable.  #10 asthma Albuterol as needed.     DVT prophylaxis: SCDs Code Status: Full Family Communication: Updated patient and daughter at bedside. Disposition Plan: Home when number of stools has decreased and patient able to adequately stay hydrated and tolerating oral solid intake, with resolution of electrolyte abnormalities. Patient with a history of lung cancer currently undergoing chemotherapy and in the immunocompromised state presenting with a C. difficile colitis and due to a immunocompromised state needs to be monitored closely in the inpatient setting as patient is likely to get significantly dehydrated with worsening of acute illness.   Consultants:   None  Procedures:   CXR 08/19/2016  Antimicrobials:   Oral vancomycin 08/19/2016   Subjective: Patient states having loose numerous stools every 2 hours. Patient denies any emesis, however some nausea. Patient has been started on clears.  Objective: Vitals:   08/19/16 1315 08/19/16 2050 08/20/16 0505 08/20/16 1349  BP: (!) 172/89 (!) 166/71 (!) 181/77 (!) 181/70  Pulse: 90 73 71 71  Resp: '18 19 18 18  '$ Temp: 97.8 F (36.6 C) 98.3 F (36.8 C) 98.5 F (36.9 C) 98.4 F (36.9 C)  TempSrc: Axillary Oral Oral Oral  SpO2: 99% 100% 99% 100%  Weight:      Height:  Intake/Output Summary (Last 24 hours) at 08/20/16 1755 Last data filed at 08/20/16 1639  Gross per 24 hour  Intake           1704.17 ml  Output                2 ml  Net          1702.17 ml   Filed Weights   08/19/16 0737  Weight: 73 kg (161 lb)    Examination:  General exam: Appears calm and comfortable  Respiratory system: Clear to auscultation. Respiratory effort normal. Cardiovascular system: S1 & S2 heard, RRR. No JVD, murmurs, rubs, gallops or clicks. No pedal edema. Gastrointestinal system: Abdomen is nondistended, soft and nontender. No organomegaly or masses felt. Normal bowel sounds heard. Central nervous system: Alert and oriented. No focal neurological deficits. Extremities: Symmetric 5 x 5 power. Skin: No rashes, lesions or ulcers Psychiatry: Judgement and insight appear normal. Mood & affect appropriate.     Data Reviewed: I have personally reviewed following labs and imaging studies  CBC:  Recent Labs Lab 08/14/16 1038 08/18/16 1415 08/19/16 0801 08/20/16 0544  WBC 10.6* 22.6* 11.5* 3.0*  NEUTROABS 8.4* 21.2* 10.1*  --   HGB 12.0 11.9 11.1* 9.6*  HCT 35.7 34.9 31.4* 28.0*  MCV 93.5 92.6 91.8 94.0  PLT 277 201 192 277   Basic Metabolic Panel:  Recent Labs Lab 08/14/16 1038 08/18/16 1415 08/19/16 0801 08/20/16 0544  NA 136 135* 131* 132*  K 3.6 4.0 3.0* 3.3*  CL  --   --  94* 100*  CO2 '26 29 26 27  '$ GLUCOSE 110 134 143* 85  BUN 17.6 26.2* 22* 12  CREATININE 1.0 1.3* 1.16* 0.83  CALCIUM 9.9 9.6 8.4* 7.8*  MG  --  1.4*  --  1.1*   GFR: Estimated Creatinine Clearance: 51.9 mL/min (by C-G formula based on SCr of 0.83 mg/dL). Liver Function Tests:  Recent Labs Lab 08/14/16 1038 08/18/16 1415 08/19/16 0801  AST 19 34 35  ALT '17 28 28  '$ ALKPHOS 98 114 85  BILITOT 0.51 1.05 1.0  PROT 7.1 7.0 6.5  ALBUMIN 3.5 3.5 3.6    Recent Labs Lab 08/19/16 0801  LIPASE 16   No results for input(s): AMMONIA in the last 168 hours. Coagulation Profile: No results for input(s): INR, PROTIME in the last 168 hours. Cardiac Enzymes: No results for input(s): CKTOTAL, CKMB,  CKMBINDEX, TROPONINI in the last 168 hours. BNP (last 3 results) No results for input(s): PROBNP in the last 8760 hours. HbA1C: No results for input(s): HGBA1C in the last 72 hours. CBG: No results for input(s): GLUCAP in the last 168 hours. Lipid Profile: No results for input(s): CHOL, HDL, LDLCALC, TRIG, CHOLHDL, LDLDIRECT in the last 72 hours. Thyroid Function Tests: No results for input(s): TSH, T4TOTAL, FREET4, T3FREE, THYROIDAB in the last 72 hours. Anemia Panel: No results for input(s): VITAMINB12, FOLATE, FERRITIN, TIBC, IRON, RETICCTPCT in the last 72 hours. Sepsis Labs:  Recent Labs Lab 08/19/16 8242  LATICACIDVEN 1.31    Recent Results (from the past 240 hour(s))  C difficile quick scan w PCR reflex     Status: Abnormal   Collection Time: 08/19/16  8:28 AM  Result Value Ref Range Status   C Diff antigen POSITIVE (A) NEGATIVE Final   C Diff toxin POSITIVE (A) NEGATIVE Final   C Diff interpretation Toxin producing C. difficile detected.  Final    Comment: RESULT CALLED TO, READ BACK BY AND  VERIFIED WITH: ROSSER,M RN AT 6387 ON 12.16.17 BY EPPERSON,S   Culture, blood (single)     Status: None (Preliminary result)   Collection Time: 08/19/16 11:21 AM  Result Value Ref Range Status   Specimen Description BLOOD BLOOD LEFT FOREARM  Final   Special Requests BOTTLES DRAWN AEROBIC AND ANAEROBIC 5CC  Final   Culture   Final    NO GROWTH < 24 HOURS Performed at [facility]    Report Status PENDING  Incomplete         Radiology Studies: Dg Chest 2 View  Result Date: 08/19/2016 CLINICAL DATA:  Fever, 5 days duration. Being treated for recurrent lung cancer. EXAM: CHEST  2 VIEW COMPARISON:  07/05/2016 FINDINGS: Left chest remains clear. There is newly seen infiltrate in the right lung, particularly in the upper lung, consistent with bronchopneumonia superimposed upon treatment changes, tumor and volume loss. IMPRESSION: Increased density in the right lung  probably representing pneumonia superimposed upon treatment changes, tumor and volume loss. Electronically Signed   By: Nelson Chimes M.D.   On: 08/19/2016 08:22        Scheduled Meds: . amLODipine  5 mg Oral Daily  . aspirin EC  81 mg Oral Daily  . atorvastatin  20 mg Oral Daily  . calcium-vitamin D  1 tablet Oral Q1200  . cholecalciferol  1,000 Units Oral Daily  . enoxaparin (LOVENOX) injection  40 mg Subcutaneous Q24H  . levothyroxine  150 mcg Oral Once per day on Sun Tue Thu Sat  . metoprolol succinate  12.5 mg Oral Daily  . montelukast  10 mg Oral QHS  . potassium chloride  40 mEq Oral Once  . saccharomyces boulardii  250 mg Oral BID  . vancomycin  125 mg Oral QID   Continuous Infusions: . sodium chloride 125 mL/hr at 08/20/16 1635     LOS: 1 day    Time spent: 1 mins    Elpidia Karn, MD [facility] Pager [phone]  If 7PM-7AM, please contact night-coverage [email or web address] Password [email or web address] 08/20/2016, 5:55 PM

## 2016-08-21 ENCOUNTER — Ambulatory Visit (HOSPITAL_COMMUNITY): Payer: Medicare Other

## 2016-08-21 ENCOUNTER — Inpatient Hospital Stay (HOSPITAL_COMMUNITY): Admission: RE | Admit: 2016-08-21 | Payer: Medicare Other | Source: Ambulatory Visit

## 2016-08-21 ENCOUNTER — Telehealth: Payer: Self-pay | Admitting: *Deleted

## 2016-08-21 ENCOUNTER — Other Ambulatory Visit: Payer: Medicare Other

## 2016-08-21 DIAGNOSIS — J181 Lobar pneumonia, unspecified organism: Secondary | ICD-10-CM

## 2016-08-21 DIAGNOSIS — C3491 Malignant neoplasm of unspecified part of right bronchus or lung: Secondary | ICD-10-CM

## 2016-08-21 DIAGNOSIS — R197 Diarrhea, unspecified: Secondary | ICD-10-CM

## 2016-08-21 DIAGNOSIS — A0472 Enterocolitis due to Clostridium difficile, not specified as recurrent: Principal | ICD-10-CM

## 2016-08-21 DIAGNOSIS — I10 Essential (primary) hypertension: Secondary | ICD-10-CM

## 2016-08-21 DIAGNOSIS — J189 Pneumonia, unspecified organism: Secondary | ICD-10-CM

## 2016-08-21 LAB — CBC WITH DIFFERENTIAL/PLATELET
BASOS PCT: 0 %
Basophils Absolute: 0 10*3/uL (ref 0.0–0.1)
EOS ABS: 0.2 10*3/uL (ref 0.0–0.7)
Eosinophils Relative: 7 %
HCT: 27.4 % — ABNORMAL LOW (ref 36.0–46.0)
HEMOGLOBIN: 9.3 g/dL — AB (ref 12.0–15.0)
LYMPHS ABS: 0.6 10*3/uL — AB (ref 0.7–4.0)
LYMPHS PCT: 17 %
MCH: 31.8 pg (ref 26.0–34.0)
MCHC: 33.9 g/dL (ref 30.0–36.0)
MCV: 93.8 fL (ref 78.0–100.0)
Monocytes Absolute: 0.4 10*3/uL (ref 0.1–1.0)
Monocytes Relative: 11 %
NEUTROS ABS: 2.2 10*3/uL (ref 1.7–7.7)
Neutrophils Relative %: 65 %
Platelets: 165 10*3/uL (ref 150–400)
RBC: 2.92 MIL/uL — ABNORMAL LOW (ref 3.87–5.11)
RDW: 13.6 % (ref 11.5–15.5)
WBC: 3.4 10*3/uL — ABNORMAL LOW (ref 4.0–10.5)

## 2016-08-21 LAB — URINE CULTURE

## 2016-08-21 LAB — BASIC METABOLIC PANEL
Anion gap: 6 (ref 5–15)
BUN: 5 mg/dL — AB (ref 6–20)
CHLORIDE: 105 mmol/L (ref 101–111)
CO2: 25 mmol/L (ref 22–32)
CREATININE: 0.75 mg/dL (ref 0.44–1.00)
Calcium: 8.1 mg/dL — ABNORMAL LOW (ref 8.9–10.3)
GFR calc Af Amer: >60 mL/min (ref >60)
GFR calc non Af Amer: >60 mL/min (ref >60)
Glucose, Bld: 85 mg/dL (ref 65–99)
Potassium: 4.7 mmol/L (ref 3.5–5.1)
SODIUM: 136 mmol/L (ref 135–145)

## 2016-08-21 LAB — MAGNESIUM: MAGNESIUM: 1.6 mg/dL — AB (ref 1.7–2.4)

## 2016-08-21 MED ORDER — ENSURE ENLIVE PO LIQD
237.0000 mL | Freq: Two times a day (BID) | ORAL | Status: DC
  Administered 2016-08-22: 237 mL via ORAL

## 2016-08-21 MED ORDER — ENSURE ENLIVE PO LIQD
237.0000 mL | Freq: Once | ORAL | Status: AC
  Administered 2016-08-21: 237 mL via ORAL

## 2016-08-21 NOTE — Telephone Encounter (Signed)
Call received from Rowe Robert PA to inform Dr. Julien Nordmann that patient is currently admitted with c-diff and would like to confirm with Dr. Julien Nordmann to hold on port placement.  Dr. Julien Nordmann notified and order received to hold on port placement.  Tiffany in [place] notified.

## 2016-08-21 NOTE — Progress Notes (Addendum)
Initial Nutrition Assessment  DOCUMENTATION CODES:   Not applicable  INTERVENTION:   Ensure Enlive po BID, each supplement provides 350 kcal and 20 grams of protein  NUTRITION DIAGNOSIS:   Inadequate oral intake related to nausea, poor appetite, cancer and cancer related treatments as evidenced by per patient/family report, meal completion < 50%.  GOAL:   Patient will meet greater than or equal to 90% of their needs  MONITOR:   PO intake, Supplement acceptance  REASON FOR ASSESSMENT:   Malnutrition Screening Tool    ASSESSMENT:   75 y.o. female with medical history significant of lung cancer, atrial fibrillation, hypertension, asthma. Symptoms started 6 days ago after her chemotherapy and lasted about one day with nausea, vomiting, diarrhea. Unfortunately, 3 days later, symptoms restarted and persisted. The next day (yesterday), she went to the cancer center for IV fluids which did not help her symptoms. She has not been able to keep food or fluids down.   Met with pt in room today. Pt reports poor appetite for past several months r/t cancer diagnosis and chemotherapy. Pt reports that her taste has changed and that she doesn't enjoy food anymore. Pt currently eating 50% meals in hospital. Pt advanced to a full liquid diet today. Per chart pts weights are stable. Pt reports still having diarrhea today but denies N/V. Pt is severely lactose intolerant. Pt reports diarrhea and projectile vomiting with milk products. Pt reports having allergy testing in the past and was negative for milk protein allergy but had severe lactose intolerance.     Medications reviewed and include: aspirin, Ca-Vit D, lovenox, synthroid, KCl, vancomycin   Labs reviewed: BUN 5(L), Ca 8.1(L), Mg 1.6(L) Wbc- 3.4(L), Hgb 9.3(L), Hct 27.4(L)  Nutrition-Focused physical exam completed. Findings are no fat depletion, no muscle depletion, and no edema.   Diet Order:  Diet full liquid Room service appropriate?  Yes; Fluid consistency: Thin  Skin:  Reviewed, no issues  Last BM:  12/18 -diarrhea  Height:   Ht Readings from Last 1 Encounters:  08/19/16 4' 10.5" (1.486 m)    Weight:   Wt Readings from Last 1 Encounters:  08/19/16 161 lb (73 kg)    Ideal Body Weight:  44.6 kg  BMI:  Body mass index is 33.08 kg/m.  Estimated Nutritional Needs:   Kcal:  1400-1700kcal/day   Protein:  87-102g/day   Fluid:  >1.5L/day   EDUCATION NEEDS:   No education needs identified at this time  Koleen Distance, RD, LDN

## 2016-08-21 NOTE — Progress Notes (Signed)
PROGRESS NOTE    Kimberly [name]  XLK:[id number] DOB: 05-Jun-1941 DOA: 08/19/2016 PCP: [name], MD    Brief Narrative:  75 y.o.femalewith medical history significant of lung cancer, atrial fibrillation, hypertension, asthma.Symptoms started 6 days ago after her chemotherapy and lasted about one day with nausea, vomiting, diarrhea. Unfortunately, 3 days later, symptoms restarted and persisted. The next day (yesterday), she went to the cancer center for IV fluids which did not help her symptoms. She has not been able to keep food or fluids down. No known sick contacts.  Assessment & Plan:   Principal Problem:   C. difficile colitis Active Problems:   Essential hypertension   Asthma   Lung cancer, middle lobe (HCC)   Paroxysmal atrial fibrillation (HCC)   Hyponatremia   Hypokalemia   Malignant neoplasm of right lung (HCC)   Nausea and vomiting   Hypomagnesemia   #1 C. difficile colitis -Continue on IV fluid hydration -Patient is continued on oral vancomycin -Still reports loose stools, albeit somewhat improved today -Patient is tolerating clear liquid diet, will advance as tolerated -Remains afebrile  #2 hypokalemia/hypomagnesemia/hyponatremia -Likely secondary to diarrhea -Continue to correct as needed  #3 hypertension -Blood pressure stable -Continue current regimen for now, titrate accordingly  #4 hypothyroidism -Stable at present -will continue home dose Synthroid.  #5 paroxysmal atrial fibrillation CHA2DS2VASC 3 -Presently rate controlled -Would continue Toprol-XL. Aspirin.  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  #6 history of lung cancer -Patient currently undergoing Taxol and carboplatin chemotherapy per oncology.  -Patient's oncologist aware of C. difficile colitis.  -Anticipate holding off on chemotherapy until colitis fully resolves.   #7 dehydration -Continue IV fluid hydration for  now as tolerated  #8 pulmonary infiltrate -Reported on recent chest x-ray. Patient otherwise a symptomatically -Consider follow-up chest x-ray if patient develops pulmonary symptoms  #9 chronic kidney disease stage III -Remains stable at this time -Repeat basic metabolic panel morning  #36 asthma -No audible wheezing on exam -Continue albuterol as needed  DVT prophylaxis: Lovenox Code Status: Full code Family Communication: Patient room, family at bedside Disposition Plan: Anticipate discharge home when C. difficile colitis improves and patient can tolerate by mouth  Consultants:    Procedures:     Antimicrobials: Anti-infectives    Start     Dose/Rate Route Frequency Ordered Stop   08/19/16 1400  vancomycin (VANCOCIN) 50 mg/mL oral solution 125 mg     125 mg Oral 4 times daily 08/19/16 1335 09/02/16 1359   08/19/16 1045  metroNIDAZOLE (FLAGYL) tablet 500 mg  Status:  Discontinued     500 mg Oral  Once 08/19/16 1037 08/19/16 1335   08/19/16 1030  levofloxacin (LEVAQUIN) IVPB 500 mg     500 mg 100 mL/hr over 60 Minutes Intravenous  Once 08/19/16 1021 08/19/16 1159       Subjective: Eager to try regular food  Objective: Vitals:   08/20/16 1815 08/20/16 2012 08/21/16 0420 08/21/16 1442  BP: (!) 160/60 (!) 162/73 (!) 162/82 (!) 171/64  Pulse:  78 81 71  Resp:  '18 19 18  '$ Temp:  97.9 F (36.6 C) 98.7 F (37.1 C) 98.6 F (37 C)  TempSrc:  Oral Oral Oral  SpO2:  100% 99% 100%  Weight:      Height:        Intake/Output Summary (Last 24 hours) at 08/21/16 1906 Last data filed at 08/21/16 1442  Gross per 24 hour  Intake  360 ml  Output              900 ml  Net             -540 ml   Filed Weights   08/19/16 0737  Weight: 73 kg (161 lb)    Examination:  General exam: Appears calm and comfortable  Respiratory system: Clear to auscultation. Respiratory effort normal. Cardiovascular system: S1 & S2 heard, RRR Gastrointestinal system: Abdomen  is nondistended, soft and nontender. No organomegaly or masses felt. Positive bowel sounds Central nervous system: Alert and oriented. No focal neurological deficits. Extremities: Symmetric 5 x 5 power. Skin: No rashes, lesions Psychiatry: Judgement and insight appear normal. Mood & affect appropriate.   Data Reviewed: I have personally reviewed following labs and imaging studies  CBC:  Recent Labs Lab 08/18/16 1415 08/19/16 0801 08/20/16 0544 08/21/16 0559  WBC 22.6* 11.5* 3.0* 3.4*  NEUTROABS 21.2* 10.1*  --  2.2  HGB 11.9 11.1* 9.6* 9.3*  HCT 34.9 31.4* 28.0* 27.4*  MCV 92.6 91.8 94.0 93.8  PLT 201 192 172 585   Basic Metabolic Panel:  Recent Labs Lab 08/18/16 1415 08/19/16 0801 08/20/16 0544 08/21/16 0559  NA 135* 131* 132* 136  K 4.0 3.0* 3.3* 4.7  CL  --  94* 100* 105  CO2 '29 26 27 25  '$ GLUCOSE 134 143* 85 85  BUN 26.2* 22* 12 5*  CREATININE 1.3* 1.16* 0.83 0.75  CALCIUM 9.6 8.4* 7.8* 8.1*  MG 1.4*  --  1.1* 1.6*   GFR: Estimated Creatinine Clearance: 53.9 mL/min (by C-G formula based on SCr of 0.75 mg/dL). Liver Function Tests:  Recent Labs Lab 08/18/16 1415 08/19/16 0801  AST 34 35  ALT 28 28  ALKPHOS 114 85  BILITOT 1.05 1.0  PROT 7.0 6.5  ALBUMIN 3.5 3.6    Recent Labs Lab 08/19/16 0801  LIPASE 16   No results for input(s): AMMONIA in the last 168 hours. Coagulation Profile: No results for input(s): INR, PROTIME in the last 168 hours. Cardiac Enzymes: No results for input(s): CKTOTAL, CKMB, CKMBINDEX, TROPONINI in the last 168 hours. BNP (last 3 results) No results for input(s): PROBNP in the last 8760 hours. HbA1C: No results for input(s): HGBA1C in the last 72 hours. CBG: No results for input(s): GLUCAP in the last 168 hours. Lipid Profile: No results for input(s): CHOL, HDL, LDLCALC, TRIG, CHOLHDL, LDLDIRECT in the last 72 hours. Thyroid Function Tests: No results for input(s): TSH, T4TOTAL, FREET4, T3FREE, THYROIDAB in the last  72 hours. Anemia Panel: No results for input(s): VITAMINB12, FOLATE, FERRITIN, TIBC, IRON, RETICCTPCT in the last 72 hours. Sepsis Labs:  Recent Labs Lab 08/19/16 2778  LATICACIDVEN 1.31    Recent Results (from the past 240 hour(s))  C difficile quick scan w PCR reflex     Status: Abnormal   Collection Time: 08/19/16  8:28 AM  Result Value Ref Range Status   C Diff antigen POSITIVE (A) NEGATIVE Final   C Diff toxin POSITIVE (A) NEGATIVE Final   C Diff interpretation Toxin producing C. difficile detected.  Final    Comment: RESULT CALLED TO, READ BACK BY AND VERIFIED WITH: ROSSER,M RN AT 1034 ON 12.16.17 BY EPPERSON,S   Culture, blood (single)     Status: None (Preliminary result)   Collection Time: 08/19/16 11:21 AM  Result Value Ref Range Status   Specimen Description BLOOD BLOOD LEFT FOREARM  Final   Special Requests BOTTLES DRAWN AEROBIC AND ANAEROBIC 5CC  Final   Culture   Final    NO GROWTH 2 DAYS Performed at [facility]    Report Status PENDING  Incomplete  Culture, Urine     Status: Abnormal   Collection Time: 08/20/16  3:24 PM  Result Value Ref Range Status   Specimen Description URINE, RANDOM  Final   Special Requests NONE  Final   Culture (A)  Final    <10,000 COLONIES/mL INSIGNIFICANT GROWTH Performed at [facility]    Report Status 08/21/2016 FINAL  Final     Radiology Studies: No results found.  Scheduled Meds: . amLODipine  5 mg Oral Daily  . aspirin EC  81 mg Oral Daily  . atorvastatin  20 mg Oral Daily  . calcium-vitamin D  1 tablet Oral Q1200  . cholecalciferol  1,000 Units Oral Daily  . enoxaparin (LOVENOX) injection  40 mg Subcutaneous Q24H  . [START ON 08/22/2016] feeding supplement (ENSURE ENLIVE)  237 mL Oral BID BM  . levothyroxine  150 mcg Oral Once per day on Sun Tue Thu Sat  . metoprolol succinate  12.5 mg Oral Daily  . montelukast  10 mg Oral QHS  . potassium chloride  40 mEq Oral Once  . saccharomyces boulardii   250 mg Oral BID  . vancomycin  125 mg Oral QID   Continuous Infusions: . sodium chloride 125 mL/hr at 08/21/16 1604     LOS: 2 days   CHIU, Orpah Melter, MD [facility] Pager [phone]  If 7PM-7AM, please contact night-coverage [email or web address] Password [email or web address] 08/21/2016, 7:06 PM

## 2016-08-21 NOTE — Progress Notes (Signed)
Subjective: The patient is seen and examined today. She is a very pleasant 75 years old white female with recurrent non-small cell lung cancer, squamous cell carcinoma. She was recently started on systemic chemotherapy was carboplatin and paclitaxel status post 1 cycle. She tolerated the first cycle well but unfortunately week after her treatment she presented with persistent diarrhea. She was treated in the past with several courses of antibiotics for sinus infection. Lab work at the time of the admission showed finding consistent with C. Difficile. The patient was admitted for evaluation and treatment of her condition. She continues to have few episodes of diarrhea. She denied having any current fever or chills. She has no nausea or vomiting.  Objective: Vital signs in last 24 hours: Temp:  [97.9 F (36.6 C)-98.7 F (37.1 C)] 98.7 F (37.1 C) (12/18 0420) Pulse Rate:  [71-81] 81 (12/18 0420) Resp:  [18-19] 19 (12/18 0420) BP: (160-181)/(60-82) 162/82 (12/18 0420) SpO2:  [99 %-100 %] 99 % (12/18 0420)  Intake/Output from previous day: 12/17 0701 - 12/18 0700 In: -  Out: 902 [Urine:901; Stool:1] Intake/Output this shift: Total I/O In: 120 [P.O.:120] Out: -   General appearance: alert, cooperative, fatigued and no distress Resp: clear to auscultation bilaterally Cardio: regular rate and rhythm, S1, S2 normal, no murmur, click, rub or gallop GI: soft, non-tender; bowel sounds normal; no masses,  no organomegaly Extremities: extremities normal, atraumatic, no cyanosis or edema  Lab Results:   Recent Labs  08/20/16 0544 08/21/16 0559  WBC 3.0* 3.4*  HGB 9.6* 9.3*  HCT 28.0* 27.4*  PLT 172 165   BMET  Recent Labs  08/20/16 0544 08/21/16 0559  NA 132* 136  K 3.3* 4.7  CL 100* 105  CO2 27 25  GLUCOSE 85 85  BUN 12 5*  CREATININE 0.83 0.75  CALCIUM 7.8* 8.1*    Studies/Results: No results found.  Medications: I have reviewed the patient's current  medications.   Assessment/Plan: 1) recurrent non-small cell lung cancer, squamous cell carcinoma. The patient was started recently on systemic chemotherapy with carboplatin and paclitaxel status post 1 cycle. I will delay the start of cycle #2 by 1 week to give the patient more time to recover from the recent C. difficile infection. 2) C. Difficile: Continue current treatment with vancomycin and Florastor. 3) COPD and hypothyroidism. Continue current treatment for the primary team. Thank you so much for taking good care of Kimberly [name], I will continue to follow up the patient with you and assist in her management an as-needed basis.  LOS: 2 days    [name]. 08/21/2016

## 2016-08-22 DIAGNOSIS — E871 Hypo-osmolality and hyponatremia: Secondary | ICD-10-CM

## 2016-08-22 DIAGNOSIS — E876 Hypokalemia: Secondary | ICD-10-CM

## 2016-08-22 LAB — BASIC METABOLIC PANEL
Anion gap: 5 (ref 5–15)
BUN: 6 mg/dL (ref 6–20)
CHLORIDE: 106 mmol/L (ref 101–111)
CO2: 25 mmol/L (ref 22–32)
CREATININE: 0.81 mg/dL (ref 0.44–1.00)
Calcium: 8.3 mg/dL — ABNORMAL LOW (ref 8.9–10.3)
GFR calc non Af Amer: >60 mL/min (ref >60)
GLUCOSE: 87 mg/dL (ref 65–99)
Potassium: 4.5 mmol/L (ref 3.5–5.1)
Sodium: 136 mmol/L (ref 135–145)

## 2016-08-22 MED ORDER — SACCHAROMYCES BOULARDII 250 MG PO CAPS: 250.0000 mg | ORAL_CAPSULE | Freq: Two times a day (BID) | ORAL | 0 refills | Status: DC

## 2016-08-22 MED ORDER — AMLODIPINE BESYLATE 5 MG PO TABS: 5.0000 mg | ORAL_TABLET | Freq: Every day | ORAL | 0 refills | Status: DC

## 2016-08-22 MED ORDER — VANCOMYCIN 50 MG/ML ORAL SOLUTION: 125.0000 mg | Freq: Four times a day (QID) | ORAL | 0 refills | Status: DC

## 2016-08-22 MED FILL — VANCOMYCIN 50MG/ML ORAL SOL: 11 days supply | Qty: 200 | Fill #0

## 2016-08-22 NOTE — Progress Notes (Signed)
Date: August 22, 2016 Discharge orders checked for needs. No case management needs present at time of discharge. Velva Harman, RN, BSN, [place]   [phone]

## 2016-08-22 NOTE — Progress Notes (Signed)
Nutrition Brief Follow-up  RD followed up with patient for supplement tolerance and acceptance.  Pt reports tolerating and liking the flavor of the ensure supplements. Pt's daughter in the room and states she will be buying the supplements for the patient at home.  Reviewed other protein supplement options with pt and pt's daughter. Pt expects to discharge today.  If further nutrition issues arise, please consult RD.   Clayton Bibles, MS, RD, LDN Pager: [phone] After Hours Pager: [phone]

## 2016-08-22 NOTE — Discharge Summary (Signed)
Physician Discharge Summary  Kimberly [name] IEP:[id number] DOB: [birth year] DOA: 08/19/2016  PCP: [name], MD  Admit date: 08/19/2016 Discharge date: 08/22/2016  Admitted From: Home Disposition:  Home  Recommendations for Outpatient Follow-up:  1. Follow up with PCP in 1-2 weeks 2. Please obtain BMP in one week  Discharge Condition:Improved CODE STATUS:Full Diet recommendation: Soft diet, advance as tolerated   Brief/Interim Summary: 75 y.o.femalewith medical history significant of lung cancer, atrial fibrillation, hypertension, asthma.Symptoms started 6 days ago after her chemotherapy and lasted about one day with nausea, vomiting, diarrhea. Unfortunately, 3 days later, symptoms restarted and persisted. The next day (yesterday), she went to the cancer center for IV fluids which did not help her symptoms. She has not been able to keep food or fluids down. No known sick contacts.  #1 C. difficile colitis -Continue on IV fluid hydration -Patient is continued on oral vancomycin -Stool quantity and consistency improved with PO antibiotic -Patient was able to tolerate soft diet -Patient to complete total of 14 days of oral vancomycin on discharge (11 more days after discharge)  #2 hypokalemia/hypomagnesemia/hyponatremia -Likely secondary to diarrhea -Corrected. -Would repeat levels in 1-2 weeks after discharge  #3 hypertension -Blood pressure remained stable  #4 hypothyroidism -Stable at present -Continued home dose of Synthroid.  #5 paroxysmal atrial fibrillationCHA2DS2VASC 3 -Presently remained rate controlled -continued Toprol-XL. Aspirin. -Of note, Patient was taken off amiodarone inMarch secondary to corneal deposits.  #6 history of lung cancer -Patient currently undergoing Taxol and carboplatin chemotherapy per oncology.  -Patient's oncologist aware of C. difficile colitis.  -Anticipate holding off on chemotherapy until colitis fully  resolves.   #7 dehydration -Continue IV fluid hydration for now as tolerated  #8 pulmonary infiltrate -Reported on recent chest x-ray. Patient otherwise a symptomatically -Consider follow-up chest x-ray if patient develops pulmonary symptoms  #9 chronic kidney disease stage III -Remains stable at this time  #10 asthma -No audible wheezing on exam -Continued albuterol as needed -Remained stable  Discharge Diagnoses:  Principal Problem:   C. difficile colitis Active Problems:   Essential hypertension   Asthma   Lung cancer, middle lobe (HCC)   Paroxysmal atrial fibrillation (HCC)   Hyponatremia   Hypokalemia   Malignant neoplasm of right lung (HCC)   Nausea and vomiting   Hypomagnesemia   Community acquired pneumonia of right upper lobe of lung ([place])    Discharge Instructions   Allergies as of 08/22/2016      Reactions   Amiodarone Other (See Comments)   Corneal deposits   Milk-related Compounds Other (See Comments)   GI upset, projectile vomiting - can't take any dairy products      Medication List    STOP taking these medications   amoxicillin-clavulanate 875-125 MG tablet Commonly known as:  AUGMENTIN     TAKE these medications   albuterol 108 (90 Base) MCG/ACT inhaler Commonly known as:  PROVENTIL HFA;VENTOLIN HFA Inhale 1 puff into the lungs every 6 (six) hours as needed for wheezing or shortness of breath.   amLODipine 5 MG tablet Commonly known as:  NORVASC Take 1 tablet (5 mg total) by mouth daily. Start taking on:  08/23/2016   aspirin EC 81 MG tablet Take 81 mg by mouth daily.   atorvastatin 20 MG tablet Commonly known as:  LIPITOR Take 20 mg by mouth every morning.   CALCIUM 600 + D PO Take 1 tablet by mouth daily.   chlorthalidone 25 MG tablet Commonly known as:  HYGROTON Take 1 tablet (  25 mg total) by mouth daily.   diphenoxylate-atropine 2.5-0.025 MG tablet Commonly known as:  LOMOTIL Take 2 tablets by mouth 4 (four) times  daily as needed for diarrhea or loose stools.   levothyroxine 150 MCG tablet Commonly known as:  SYNTHROID, LEVOTHROID Take 150 mcg by mouth as directed. Take 1 tablet once every Tuesday, Thursday, Saturday, and Sunday.   lidocaine-prilocaine cream Commonly known as:  EMLA Apply 1 application topically as needed.   loperamide 2 MG capsule Commonly known as:  IMODIUM Take 2 mg by mouth as needed for diarrhea or loose stools.   meloxicam 7.5 MG tablet Commonly known as:  MOBIC Take 7.5 mg by mouth 2 (two) times daily as needed for pain.   methocarbamol 500 MG tablet Commonly known as:  ROBAXIN Take 500 mg by mouth every 8 (eight) hours as needed for muscle spasms.   metoprolol succinate 25 MG 24 hr tablet Commonly known as:  TOPROL-XL Take one-half tablet by  mouth daily   montelukast 10 MG tablet Commonly known as:  SINGULAIR Take 10 mg by mouth at bedtime.   MULTIVITAMIN/IRON PO Take 1 tablet by mouth daily.   ondansetron 8 MG tablet Commonly known as:  ZOFRAN Take 1 tablet (8 mg total) by mouth every 8 (eight) hours as needed for refractory nausea / vomiting (start on Day 3 after each treatment).   prochlorperazine 10 MG tablet Commonly known as:  COMPAZINE Take 1 tablet (10 mg total) by mouth every 6 (six) hours as needed for nausea or vomiting.   saccharomyces boulardii 250 MG capsule Commonly known as:  FLORASTOR Take 1 capsule (250 mg total) by mouth 2 (two) times daily.   vancomycin 50 mg/mL oral solution Commonly known as:  VANCOCIN Take 2.5 mLs (125 mg total) by mouth 4 (four) times daily.   Vitamin D3 1000 units Caps Take 1,000 Units by mouth daily.      Follow-up Information    [name], MD. Schedule an appointment as soon as possible for a visit in 2 week(s).   Specialty:  Internal Medicine Contact information: [place] [address] [phone]          Allergies  Allergen Reactions  .  Amiodarone Other (See Comments)    Corneal deposits  . Milk-Related Compounds Other (See Comments)    GI upset, projectile vomiting - can't take any dairy products     Procedures/Studies: Dg Chest 2 View  Result Date: 08/19/2016 CLINICAL DATA:  Fever, 5 days duration. Being treated for recurrent lung cancer. EXAM: CHEST  2 VIEW COMPARISON:  07/05/2016 FINDINGS: Left chest remains clear. There is newly seen infiltrate in the right lung, particularly in the upper lung, consistent with bronchopneumonia superimposed upon treatment changes, tumor and volume loss. IMPRESSION: Increased density in the right lung probably representing pneumonia superimposed upon treatment changes, tumor and volume loss. Electronically Signed   By: Nelson Chimes M.D.   On: 08/19/2016 08:22   Mr Jeri Cos RJ Contrast  Result Date: 08/11/2016 CLINICAL DATA:  Lung cancer staging EXAM: MRI HEAD WITHOUT AND WITH CONTRAST TECHNIQUE: Multiplanar, multiecho pulse sequences of the brain and surrounding structures were obtained without and with intravenous contrast. CONTRAST:  33m MULTIHANCE GADOBENATE DIMEGLUMINE 529 MG/ML IV SOLN COMPARISON:  MRI head 11/11/2014 FINDINGS: Brain: Ventricle size and cerebral volume normal. Negative for acute infarct. Scattered small white matter hyperintensities bilaterally consistent with mild chronic microvascular ischemia. Negative for hemorrhage. Negative for mass or edema. Normal  enhancement following contrast infusion. No enhancing metastatic disease. Leptomeningeal enhancement is normal. Vascular: Normal arterial flow voids. Skull and upper cervical spine: Negative Sinuses/Orbits: Negative Other: None IMPRESSION: Negative for metastatic disease to the brain. Normal for age MRI of the brain with contrast. Electronically Signed   By: Franchot Gallo M.D.   On: 08/11/2016 08:42   Mr Lumbar Spine W Wo Contrast  Result Date: 08/11/2016 CLINICAL DATA:  Lung cancer. PET scan question lumbar metastatic  disease. Hypermetabolic activity posterior elements of L3 EXAM: MRI LUMBAR SPINE WITHOUT AND WITH CONTRAST TECHNIQUE: Multiplanar and multiecho pulse sequences of the lumbar spine were obtained without and with intravenous contrast. CONTRAST:  57m MULTIHANCE GADOBENATE DIMEGLUMINE 529 MG/ML IV SOLN COMPARISON:  PET 07/25/2016 FINDINGS: Segmentation:  Normal Alignment: Mild levoscoliosis at L3. Mild anterolisthesis L3-4 and L4-5. Vertebrae:  Discogenic edema and enhancement on the right at L2-3. Extensive facet arthropathy at L3-4 and L4-5. There is degenerative change between the spinous processes of L3 and L4 with edema and enhancement of the soft tissues as well as cysts in the adjacent soft tissues. This is most compatible with Baastrup's disease and corresponds to the PET scan finding. This appears inflammatory and not neoplastic. Conus medullaris: Extends to the L1-2 level and appears normal. Paraspinal and other soft tissues: Retroperitoneal structures normal. Paraspinous muscles are symmetric and well developed. Disc levels: L1-2:  Negative L2-3: Disc degeneration, right greater than left related to scoliosis. Endplate edema and enhancement. Diffuse disc bulging and endplate spurring. Bilateral facet degeneration. Moderate spinal stenosis. Mild foraminal narrowing bilaterally L3-4: Mild anterolisthesis. Severe facet degeneration. Diffuse disc bulging. Severe spinal stenosis. Moderate to severe foraminal encroachment bilaterally. L4-5: Mild anterolisthesis. Severe facet degeneration. 5 mm cyst in the left foramen appears to be a synovial cyst arising from the facet joint and is compressing the left L4 nerve root. Severe spinal stenosis. Small cysts adjacent to the L3 and L4 spinous processes compatible with degenerative change between the spinous processes. This corresponds to the PET scan. L5-S1: Negative IMPRESSION: Negative for metastatic disease PET scan findings corresponds to severe facet degeneration  bilaterally at L3-4 L4-5. In addition, there is significant degenerative change and soft tissues cystic changes between the spinous processes of L3 and L4, consistent with Baastrup's disease. Moderate spinal stenosis L2-3 with mild foraminal narrowing bilaterally. Severe spinal stenosis L3-4 with moderate to severe foraminal encroachment bilaterally Severe spinal stenosis L4-5. 5 mm synovial cyst projecting into the left foramen compressing the left L4 nerve root. Electronically Signed   By: CFranchot GalloM.D.   On: 08/11/2016 09:05   Nm Pet Image Restag (ps) Skull Base To Thigh  Result Date: 07/25/2016 CLINICAL DATA:  Subsequent treatment strategy for right lower lobe lung cancer. Enlarging mediastinal nodes. EXAM: NUCLEAR MEDICINE PET SKULL BASE TO THIGH TECHNIQUE: 8.1 mCi F-18 FDG was injected intravenously. Full-ring PET imaging was performed from the skull base to thigh after the radiotracer. CT data was obtained and used for attenuation correction and anatomic localization. FASTING BLOOD GLUCOSE:  Value: 94 mg/dl COMPARISON:  Chest CT 07/10/2016.  Prior PET of 10/15/2013. FINDINGS: NECK No areas of abnormal hypermetabolism. No cervical adenopathy. Surgical changes of at least partial thyroidectomy. CHEST Hypermetabolism corresponding to the enlarging subcarinal soft tissue. This measures 2.1 cm and a S.U.V. max of 15.0 on image 60/series 4. There is low-level hypermetabolism within right middle lobe consolidation which is likely radiation induced. A more focal area of hypermetabolism identified within measures a S.U.V. max of 8.9 on  approximately image 30/series 7. No well-defined mass in this area. Distal esophageal hypermetabolism is without CT correlate and measures a S.U.V. max of 6.9 on image 80/series 4. Other chest findings deferred to recent diagnostic CT. Multivessel coronary artery atherosclerosis. Trace right pleural thickening. Right lower lobe radiation fibrosis as well. Calcified left lower  lobe granuloma. ABDOMEN/PELVIS No abdominal pelvic nodal or parenchymal hypermetabolism. Tiny hiatal hernia. Aortic and branch vessel atherosclerosis. SKELETON New hypermetabolism centered about the posterior elements at L3. This measures a S.U.V. max of 7.3. No well-defined CT correlate. New muscular hypermetabolism about the posterior aspect of the left scapula. This measures a S.U.V. max of 5.6, including on image 51/series 4. No CT correlate today or on the prior diagnostic CT. Lipomas identified posterior to the left scapula and within the right thigh. IMPRESSION: 1. Hypermetabolism corresponding to subcarinal nodal tissue, consistent with metastatic disease. 2. Right middle lobe radiation fibrosis with a focus of hypermetabolism within, favored to be inflammatory. Recommend attention on follow-up. 3. Hypermetabolism about the posterior elements of L3, without definite CT correlate. Suspicious for metastatic disease. Degenerative etiology felt less likely. Consider dedicated pre and post contrast lumbar spine MRI. A muscular focus of hypermetabolism posterior to the left scapula is without CT correlate and indeterminate. Especially if the L3 hypermetabolism is confirmed to be an osseous metastasis, muscular metastasis is suspected. 4. Small hiatal hernia with likely physiologic or inflammatory distal esophageal hypermetabolism. 5.  Coronary artery atherosclerosis. Aortic atherosclerosis. Electronically Signed   By: Abigail Miyamoto M.D.   On: 07/25/2016 10:36     Subjective: Eager to go home  Discharge Exam: Vitals:   08/21/16 2055 08/22/16 0502  BP: (!) 151/94 (!) 174/73  Pulse: 84 97  Resp: 18 18  Temp: 98.8 F (37.1 C) 98.7 F (37.1 C)   Vitals:   08/21/16 0420 08/21/16 1442 08/21/16 2055 08/22/16 0502  BP: (!) 162/82 (!) 171/64 (!) 151/94 (!) 174/73  Pulse: 81 71 84 97  Resp: '19 18 18 18  '$ Temp: 98.7 F (37.1 C) 98.6 F (37 C) 98.8 F (37.1 C) 98.7 F (37.1 C)  TempSrc: Oral Oral Oral  Oral  SpO2: 99% 100% 99% 96%  Weight:      Height:        General: Pt is alert, awake, not in acute distress Cardiovascular: RRR, S1/S2 +, no rubs, no gallops Respiratory: CTA bilaterally, no wheezing, no rhonchi Abdominal: Soft, NT, ND, bowel sounds + Extremities: no edema, no cyanosis   The results of significant diagnostics from this hospitalization (including imaging, microbiology, ancillary and laboratory) are listed below for reference.     Microbiology: Recent Results (from the past 240 hour(s))  C difficile quick scan w PCR reflex     Status: Abnormal   Collection Time: 08/19/16  8:28 AM  Result Value Ref Range Status   C Diff antigen POSITIVE (A) NEGATIVE Final   C Diff toxin POSITIVE (A) NEGATIVE Final   C Diff interpretation Toxin producing C. difficile detected.  Final    Comment: RESULT CALLED TO, READ BACK BY AND VERIFIED WITH: ROSSER,M RN AT 1034 ON 12.16.17 BY EPPERSON,S   Culture, blood (single)     Status: None (Preliminary result)   Collection Time: 08/19/16 11:21 AM  Result Value Ref Range Status   Specimen Description BLOOD BLOOD LEFT FOREARM  Final   Special Requests BOTTLES DRAWN AEROBIC AND ANAEROBIC 5CC  Final   Culture   Final    NO GROWTH 2 DAYS Performed  at [facility]    Report Status PENDING  Incomplete  Culture, Urine     Status: Abnormal   Collection Time: 08/20/16  3:24 PM  Result Value Ref Range Status   Specimen Description URINE, RANDOM  Final   Special Requests NONE  Final   Culture (A)  Final    <10,000 COLONIES/mL INSIGNIFICANT GROWTH Performed at [facility]    Report Status 08/21/2016 FINAL  Final     Labs: BNP (last 3 results) No results for input(s): BNP in the last 8760 hours. Basic Metabolic Panel:  Recent Labs Lab 08/18/16 1415 08/19/16 0801 08/20/16 0544 08/21/16 0559 08/22/16 0552  NA 135* 131* 132* 136 136  K 4.0 3.0* 3.3* 4.7 4.5  CL  --  94* 100* 105 106  CO2 '29 26 27 25 25  '$ GLUCOSE 134  143* 85 85 87  BUN 26.2* 22* 12 5* 6  CREATININE 1.3* 1.16* 0.83 0.75 0.81  CALCIUM 9.6 8.4* 7.8* 8.1* 8.3*  MG 1.4*  --  1.1* 1.6*  --    Liver Function Tests:  Recent Labs Lab 08/18/16 1415 08/19/16 0801  AST 34 35  ALT 28 28  ALKPHOS 114 85  BILITOT 1.05 1.0  PROT 7.0 6.5  ALBUMIN 3.5 3.6    Recent Labs Lab 08/19/16 0801  LIPASE 16   No results for input(s): AMMONIA in the last 168 hours. CBC:  Recent Labs Lab 08/18/16 1415 08/19/16 0801 08/20/16 0544 08/21/16 0559  WBC 22.6* 11.5* 3.0* 3.4*  NEUTROABS 21.2* 10.1*  --  2.2  HGB 11.9 11.1* 9.6* 9.3*  HCT 34.9 31.4* 28.0* 27.4*  MCV 92.6 91.8 94.0 93.8  PLT 201 192 172 165   Cardiac Enzymes: No results for input(s): CKTOTAL, CKMB, CKMBINDEX, TROPONINI in the last 168 hours. BNP: Invalid input(s): POCBNP CBG: No results for input(s): GLUCAP in the last 168 hours. D-Dimer No results for input(s): DDIMER in the last 72 hours. Hgb A1c No results for input(s): HGBA1C in the last 72 hours. Lipid Profile No results for input(s): CHOL, HDL, LDLCALC, TRIG, CHOLHDL, LDLDIRECT in the last 72 hours. Thyroid function studies No results for input(s): TSH, T4TOTAL, T3FREE, THYROIDAB in the last 72 hours.  Invalid input(s): FREET3 Anemia work up No results for input(s): VITAMINB12, FOLATE, FERRITIN, TIBC, IRON, RETICCTPCT in the last 72 hours. Urinalysis    Component Value Date/Time   COLORURINE YELLOW 08/19/2016 1546   APPEARANCEUR CLEAR 08/19/2016 1546   LABSPEC 1.015 08/19/2016 1546   PHURINE 5.0 08/19/2016 1546   GLUCOSEU NEGATIVE 08/19/2016 1546   HGBUR SMALL (A) 08/19/2016 1546   BILIRUBINUR NEGATIVE 08/19/2016 1546   KETONESUR NEGATIVE 08/19/2016 1546   PROTEINUR 30 (A) 08/19/2016 1546   UROBILINOGEN 0.2 02/27/2014 1314   NITRITE NEGATIVE 08/19/2016 1546   LEUKOCYTESUR MODERATE (A) 08/19/2016 1546   Sepsis Labs Invalid input(s): PROCALCITONIN,  WBC,  LACTICIDVEN Microbiology Recent Results (from  the past 240 hour(s))  C difficile quick scan w PCR reflex     Status: Abnormal   Collection Time: 08/19/16  8:28 AM  Result Value Ref Range Status   C Diff antigen POSITIVE (A) NEGATIVE Final   C Diff toxin POSITIVE (A) NEGATIVE Final   C Diff interpretation Toxin producing C. difficile detected.  Final    Comment: RESULT CALLED TO, READ BACK BY AND VERIFIED WITH: ROSSER,M RN AT 1914 ON 12.16.17 BY EPPERSON,S   Culture, blood (single)     Status: None (Preliminary result)  Collection Time: 08/19/16 11:21 AM  Result Value Ref Range Status   Specimen Description BLOOD BLOOD LEFT FOREARM  Final   Special Requests BOTTLES DRAWN AEROBIC AND ANAEROBIC 5CC  Final   Culture   Final    NO GROWTH 2 DAYS Performed at [facility]    Report Status PENDING  Incomplete  Culture, Urine     Status: Abnormal   Collection Time: 08/20/16  3:24 PM  Result Value Ref Range Status   Specimen Description URINE, RANDOM  Final   Special Requests NONE  Final   Culture (A)  Final    <10,000 COLONIES/mL INSIGNIFICANT GROWTH Performed at [facility]    Report Status 08/21/2016 FINAL  Final     SIGNED:   Donne Hazel, MD  [facility] 08/22/2016, 12:49 PM  If 7PM-7AM, please contact night-coverage [email or web address] Password [email or web address]

## 2016-08-24 ENCOUNTER — Telehealth: Payer: Self-pay | Admitting: *Deleted

## 2016-08-24 LAB — CULTURE, BLOOD (SINGLE): Culture: NO GROWTH

## 2016-08-24 NOTE — Telephone Encounter (Signed)
[name] to follow up on [facility] visit on 08/18/16. Pt was admitted to hospital on 08/19/16 with C. Diff. She was discharged home on 08/22/16.  Spoke with patient.  She sates she feels 100% better. On po vancomycin. She states she has 1 loose stool a day now. She is eating soft, bland diet. Denies fever, chills. Drinking good amount of fluids. She does have questions about her f/u appts. She was told that her appts for 09/06/15 were to be cancelled and that she would see Dr. Julien Nordmann on 09/12/15.[name] Discharge summary states she needs repeat labs 1 week s/p hospital discharge.  At this time-no changes appear in her appts.  Message left for Diane, RN with Dr. Julien Nordmann.

## 2016-08-25 ENCOUNTER — Telehealth: Payer: Self-pay | Admitting: Oncology

## 2016-08-25 NOTE — Telephone Encounter (Signed)
Spoke with patient re lab 12/26.

## 2016-08-29 ENCOUNTER — Other Ambulatory Visit (HOSPITAL_BASED_OUTPATIENT_CLINIC_OR_DEPARTMENT_OTHER): Payer: Medicare Other

## 2016-08-29 DIAGNOSIS — C342 Malignant neoplasm of middle lobe, bronchus or lung: Secondary | ICD-10-CM | POA: Diagnosis not present

## 2016-08-29 LAB — CBC WITH DIFFERENTIAL/PLATELET
BASO%: 0.3 % (ref 0.0–2.0)
BASOS ABS: 0 10*3/uL (ref 0.0–0.1)
EOS ABS: 0.2 10*3/uL (ref 0.0–0.5)
EOS%: 1.4 % (ref 0.0–7.0)
HEMATOCRIT: 29.9 % — AB (ref 34.8–46.6)
HGB: 10.2 g/dL — ABNORMAL LOW (ref 11.6–15.9)
LYMPH#: 1.3 10*3/uL (ref 0.9–3.3)
LYMPH%: 12.5 % — ABNORMAL LOW (ref 14.0–49.7)
MCH: 32 pg (ref 25.1–34.0)
MCHC: 34.1 g/dL (ref 31.5–36.0)
MCV: 93.7 fL (ref 79.5–101.0)
MONO#: 0.5 10*3/uL (ref 0.1–0.9)
MONO%: 4.6 % (ref 0.0–14.0)
NEUT#: 8.6 10*3/uL — ABNORMAL HIGH (ref 1.5–6.5)
NEUT%: 81.2 % — AB (ref 38.4–76.8)
PLATELETS: 157 10*3/uL (ref 145–400)
RBC: 3.19 10*6/uL — ABNORMAL LOW (ref 3.70–5.45)
RDW: 13.2 % (ref 11.2–14.5)
WBC: 10.6 10*3/uL — ABNORMAL HIGH (ref 3.9–10.3)

## 2016-08-29 LAB — COMPREHENSIVE METABOLIC PANEL
ALBUMIN: 3.3 g/dL — AB (ref 3.5–5.0)
ALK PHOS: 111 U/L (ref 40–150)
ALT: 29 U/L (ref 0–55)
ANION GAP: 10 meq/L (ref 3–11)
AST: 22 U/L (ref 5–34)
BUN: 23.2 mg/dL (ref 7.0–26.0)
CALCIUM: 10.3 mg/dL (ref 8.4–10.4)
CHLORIDE: 96 meq/L — AB (ref 98–109)
CO2: 31 mEq/L — ABNORMAL HIGH (ref 22–29)
CREATININE: 0.9 mg/dL (ref 0.6–1.1)
EGFR: 60 mL/min/{1.73_m2} — ABNORMAL LOW (ref >90)
Glucose: 117 mg/dl (ref 70–140)
POTASSIUM: 3.8 meq/L (ref 3.5–5.1)
Sodium: 137 mEq/L (ref 136–145)
Total Bilirubin: 0.3 mg/dL (ref 0.20–1.20)
Total Protein: 7.3 g/dL (ref 6.4–8.3)

## 2016-09-05 ENCOUNTER — Ambulatory Visit: Payer: Medicare Other

## 2016-09-05 ENCOUNTER — Telehealth: Payer: Self-pay | Admitting: *Deleted

## 2016-09-05 ENCOUNTER — Other Ambulatory Visit: Payer: Medicare Other

## 2016-09-05 ENCOUNTER — Ambulatory Visit: Payer: Medicare Other | Admitting: Internal Medicine

## 2016-09-05 NOTE — Telephone Encounter (Signed)
Per patient request I have canceled her treatment appt on 1/8

## 2016-09-06 DIAGNOSIS — A0472 Enterocolitis due to Clostridium difficile, not specified as recurrent: Secondary | ICD-10-CM | POA: Diagnosis not present

## 2016-09-07 ENCOUNTER — Ambulatory Visit: Payer: Medicare Other

## 2016-09-07 DIAGNOSIS — R197 Diarrhea, unspecified: Secondary | ICD-10-CM | POA: Diagnosis not present

## 2016-09-11 ENCOUNTER — Telehealth: Payer: Self-pay | Admitting: Internal Medicine

## 2016-09-11 ENCOUNTER — Ambulatory Visit: Payer: Medicare Other

## 2016-09-11 ENCOUNTER — Encounter: Payer: Self-pay | Admitting: Internal Medicine

## 2016-09-11 ENCOUNTER — Ambulatory Visit (HOSPITAL_BASED_OUTPATIENT_CLINIC_OR_DEPARTMENT_OTHER): Payer: Medicare Other | Admitting: Internal Medicine

## 2016-09-11 ENCOUNTER — Other Ambulatory Visit: Payer: Self-pay | Admitting: Radiology

## 2016-09-11 ENCOUNTER — Other Ambulatory Visit: Payer: Medicare Other

## 2016-09-11 ENCOUNTER — Other Ambulatory Visit (HOSPITAL_BASED_OUTPATIENT_CLINIC_OR_DEPARTMENT_OTHER): Payer: Medicare Other

## 2016-09-11 VITALS — BP 142/70 | HR 97 | Temp 98.5°F | Resp 18 | Ht 58.5 in | Wt 163.3 lb

## 2016-09-11 DIAGNOSIS — C342 Malignant neoplasm of middle lobe, bronchus or lung: Secondary | ICD-10-CM | POA: Diagnosis not present

## 2016-09-11 DIAGNOSIS — D6481 Anemia due to antineoplastic chemotherapy: Secondary | ICD-10-CM | POA: Diagnosis not present

## 2016-09-11 DIAGNOSIS — Z5111 Encounter for antineoplastic chemotherapy: Secondary | ICD-10-CM

## 2016-09-11 DIAGNOSIS — K529 Noninfective gastroenteritis and colitis, unspecified: Secondary | ICD-10-CM

## 2016-09-11 DIAGNOSIS — R0602 Shortness of breath: Secondary | ICD-10-CM

## 2016-09-11 DIAGNOSIS — A0472 Enterocolitis due to Clostridium difficile, not specified as recurrent: Secondary | ICD-10-CM

## 2016-09-11 DIAGNOSIS — I10 Essential (primary) hypertension: Secondary | ICD-10-CM

## 2016-09-11 LAB — CBC WITH DIFFERENTIAL/PLATELET
BASO%: 1 % (ref 0.0–2.0)
Basophils Absolute: 0.1 10*3/uL (ref 0.0–0.1)
EOS ABS: 0.3 10*3/uL (ref 0.0–0.5)
EOS%: 2.5 % (ref 0.0–7.0)
HCT: 32.6 % — ABNORMAL LOW (ref 34.8–46.6)
HEMOGLOBIN: 11.1 g/dL — AB (ref 11.6–15.9)
LYMPH%: 10.9 % — ABNORMAL LOW (ref 14.0–49.7)
MCH: 32.4 pg (ref 25.1–34.0)
MCHC: 34.1 g/dL (ref 31.5–36.0)
MCV: 94.9 fL (ref 79.5–101.0)
MONO#: 0.7 10*3/uL (ref 0.1–0.9)
MONO%: 6.1 % (ref 0.0–14.0)
NEUT%: 79.5 % — ABNORMAL HIGH (ref 38.4–76.8)
NEUTROS ABS: 8.7 10*3/uL — AB (ref 1.5–6.5)
Platelets: 469 10*3/uL — ABNORMAL HIGH (ref 145–400)
RBC: 3.43 10*6/uL — AB (ref 3.70–5.45)
RDW: 14.7 % — AB (ref 11.2–14.5)
WBC: 11 10*3/uL — ABNORMAL HIGH (ref 3.9–10.3)
lymph#: 1.2 10*3/uL (ref 0.9–3.3)

## 2016-09-11 LAB — COMPREHENSIVE METABOLIC PANEL
ALBUMIN: 3.6 g/dL (ref 3.5–5.0)
ALK PHOS: 129 U/L (ref 40–150)
ALT: 16 U/L (ref 0–55)
AST: 18 U/L (ref 5–34)
Anion Gap: 12 mEq/L — ABNORMAL HIGH (ref 3–11)
BILIRUBIN TOTAL: 0.37 mg/dL (ref 0.20–1.20)
BUN: 24.4 mg/dL (ref 7.0–26.0)
CO2: 33 meq/L — AB (ref 22–29)
CREATININE: 1 mg/dL (ref 0.6–1.1)
Calcium: 10.1 mg/dL (ref 8.4–10.4)
Chloride: 96 mEq/L — ABNORMAL LOW (ref 98–109)
EGFR: 54 mL/min/{1.73_m2} — ABNORMAL LOW (ref >90)
GLUCOSE: 143 mg/dL — AB (ref 70–140)
Potassium: 3.3 mEq/L — ABNORMAL LOW (ref 3.5–5.1)
SODIUM: 141 meq/L (ref 136–145)
TOTAL PROTEIN: 7.1 g/dL (ref 6.4–8.3)

## 2016-09-11 NOTE — Telephone Encounter (Signed)
Gave patient avs report and appointments for January and February, port placement appointment.

## 2016-09-11 NOTE — Progress Notes (Signed)
[place] [phone]   [phone]  OFFICE PROGRESS NOTE  [name], MD [address] [place] [address]  DIAGNOSIS: Recurrent non-small cell lung cancer, squamous cell carcinoma initially diagnosed as unresectable a stage IIIa in February 2015.  PRIOR THERAPY: 1) Status post exploratory right VATS with mediastinal biopsy under the care of Dr. Roxan Hockey on 11/13/2013. The tumor was found to be unresectable at that time.[name] 2) Concurrent chemoradiation with weekly carboplatin for AUC of 2 and paclitaxel 45 mg/M2, status post 6 cycles with partial response. First cycle was given on 12/01/2013. 3) Consolidation chemotherapy with carboplatin for AUC of 5 and paclitaxel 175 mg/M2 every 3 weeks with Neulasta support. First dose 02/23/2014. Status post 3 cycles with partial response.  CURRENT THERAPY: Systemic chemotherapy with carboplatin for AUC of 5 and paclitaxel 175 MG/M2 every 3 weeks with Neulasta support. Status post one cycle. First dose was given on 08/14/2016.  INTERVAL HISTORY: Kimberly [name] 76 y.o. female returns to the clinic today for follow-up visit accompanied by her daughter. The patient is feeling a little bit better today except for alopecia and mild peripheral neuropathy. She tolerated the first cycle of her treatment well except for significant diarrhea and she was diagnosed with C. difficile after the first cycle of the treatment. She is feeling much better today. She denied having any further diarrhea. She has no chest pain but continues to have shortness breath with exertion and mild cough with no hemoptysis. She has no fever or chills. She denied having any significant weight loss or night sweats. She is here today for evaluation and discussion of her condition before resuming her chemotherapy.  MEDICAL HISTORY: Past Medical History:  Diagnosis Date  . Anxiety    due to surgery   . Aortic  insufficiency   . Arthritis   . Atrial fibrillation ([place])   . Back pain 08/14/2016  . Bronchitis   . Cough    dry, endobronchial mass  . Dyslipidemia   . GERD (gastroesophageal reflux disease)   . Heart murmur   . History of nuclear stress test 02/26/2006   exercise myoview; normal pattern of perfusion; low risk scan   . Hypertension   . Hypothyroidism   . Mitral insufficiency   . Pneumonia    pus bronchitis  . PONV (postoperative nausea and vomiting)   . Radiation 12/01/13-01/08/14   50.4 gray to right central chest  . Shortness of breath    with exertion  . Squamous cell carcinoma of lung ([place])   . Syncope    "states she has passed out a few times. dr is trying to find cause.last time when geeting ready to go home after video bronch/bx  . Thyroid disease     ALLERGIES:  is allergic to amiodarone and milk-related compounds.  MEDICATIONS:  Current Outpatient Prescriptions  Medication Sig Dispense Refill  . albuterol (PROVENTIL HFA;VENTOLIN HFA) 108 (90 BASE) MCG/ACT inhaler Inhale 1 puff into the lungs every 6 (six) hours as needed for wheezing or shortness of breath.     [name] amLODipine (NORVASC) 5 MG tablet Take 1 tablet (5 mg total) by mouth daily. 30 tablet 0  . aspirin EC 81 MG tablet Take 81 mg by mouth daily.    [name] atorvastatin (LIPITOR) 20 MG tablet Take 20 mg by mouth every morning.     . Calcium Carb-Cholecalciferol (CALCIUM 600 + D PO) Take 1 tablet by mouth daily.    [name]  chlorthalidone (HYGROTON) 25 MG tablet Take 1 tablet (25 mg total) by mouth daily. 90 tablet 3  . Cholecalciferol (VITAMIN D3) 1000 units CAPS Take 1,000 Units by mouth daily.    . diphenoxylate-atropine (LOMOTIL) 2.5-0.025 MG tablet Take 2 tablets by mouth 4 (four) times daily as needed for diarrhea or loose stools. 30 tablet 0  . levothyroxine (SYNTHROID, LEVOTHROID) 150 MCG tablet Take 150 mcg by mouth as directed. Take 1 tablet once every Tuesday, Thursday, Saturday, and Sunday.    .  lidocaine-prilocaine (EMLA) cream Apply 1 application topically as needed. 30 g 0  . loperamide (IMODIUM) 2 MG capsule Take 2 mg by mouth as needed for diarrhea or loose stools.    . meloxicam (MOBIC) 7.5 MG tablet Take 7.5 mg by mouth 2 (two) times daily as needed for pain.    . methocarbamol (ROBAXIN) 500 MG tablet Take 500 mg by mouth every 8 (eight) hours as needed for muscle spasms.    . metoprolol succinate (TOPROL-XL) 25 MG 24 hr tablet Take one-half tablet by  mouth daily 45 tablet 3  . montelukast (SINGULAIR) 10 MG tablet Take 10 mg by mouth at bedtime.     . Multiple Vitamins-Iron (MULTIVITAMIN/IRON PO) Take 1 tablet by mouth daily.     . ondansetron (ZOFRAN) 8 MG tablet Take 1 tablet (8 mg total) by mouth every 8 (eight) hours as needed for refractory nausea / vomiting (start on Day 3 after each treatment). 20 tablet 0  . prochlorperazine (COMPAZINE) 10 MG tablet Take 1 tablet (10 mg total) by mouth every 6 (six) hours as needed for nausea or vomiting. 30 tablet 1  . saccharomyces boulardii (FLORASTOR) 250 MG capsule Take 1 capsule (250 mg total) by mouth 2 (two) times daily. 30 capsule 0  . vancomycin (VANCOCIN) 50 mg/mL oral solution Take 2.5 mLs (125 mg total) by mouth 4 (four) times daily. 110 mL 0   No current facility-administered medications for this visit.     SURGICAL HISTORY:  Past Surgical History:  Procedure Laterality Date  . CARDIAC CATHETERIZATION  07/08/2008   normal coronaries  . CARPAL TUNNEL RELEASE Right 1988  . COLONOSCOPY N/A 02/11/2016   Procedure: COLONOSCOPY;  Surgeon: Carol Ada, MD;  Location: [facility];  Service: Endoscopy;  Laterality: N/A;  . DILATION AND CURETTAGE OF UTERUS    . EYE SURGERY Bilateral   . H/O MET Test w/PFT  04/02/2012   low risk; peak VO2 77% predicted  . JOINT REPLACEMENT  2003   thumb rt  . KNEE ARTHROSCOPY  12   rt meniscus  . MEDIASTINOSCOPY N/A 10/30/2013   Procedure: MEDIASTINOSCOPY;  Surgeon: Melrose Nakayama, MD;   Location: [facility];  Service: Thoracic;  Laterality: N/A;  . ROTATOR CUFF REPAIR  2006   ? side  . THYROIDECTOMY  1973  . TRANSTHORACIC ECHOCARDIOGRAM  09/25/2012   EF 55-60%; mild LVH & mild concentric hypertrophy; mild AV regurg; RV systolic pressure increase consistent with mild pulm HTN  . VIDEO ASSISTED THORACOSCOPY (VATS)/ LOBECTOMY Right 11/13/2013   Procedure: VIDEO ASSISTED THORACOSCOPY (VATS) with mediastinal  biopsies;  Surgeon: Melrose Nakayama, MD;  Location: [facility];  Service: Thoracic;  Laterality: Right;  RIGHT VATS,mediastinal biopsies  . VIDEO BRONCHOSCOPY Bilateral 09/25/2013   Procedure: VIDEO BRONCHOSCOPY WITHOUT FLUORO;  Surgeon: Tanda Rockers, MD;  Location: [facility];  Service: Cardiopulmonary;  Laterality: Bilateral;  . VIDEO BRONCHOSCOPY WITH ENDOBRONCHIAL ULTRASOUND N/A 10/30/2013   Procedure: VIDEO BRONCHOSCOPY WITH ENDOBRONCHIAL ULTRASOUND;  Surgeon: Melrose Nakayama, MD;  Location: [facility];  Service: Thoracic;  Laterality: N/A;    REVIEW OF SYSTEMS:  Constitutional: positive for fatigue Eyes: negative Ears, nose, mouth, throat, and face: negative Respiratory: positive for cough and dyspnea on exertion Cardiovascular: negative Gastrointestinal: negative Genitourinary:negative Integument/breast: negative Hematologic/lymphatic: negative Musculoskeletal:negative Neurological: negative Behavioral/Psych: negative Endocrine: negative Allergic/Immunologic: negative   PHYSICAL EXAMINATION: General appearance: alert, cooperative, fatigued and no distress Head: Normocephalic, without obvious abnormality, atraumatic Neck: no adenopathy, no JVD, supple, symmetrical, trachea midline and thyroid not enlarged, symmetric, no tenderness/mass/nodules Lymph nodes: Cervical, supraclavicular, and axillary nodes normal. Resp: clear to auscultation bilaterally Back: symmetric, no curvature. ROM normal. No CVA tenderness. Cardio: regular rate and rhythm, S1, S2 normal, no  murmur, click, rub or gallop GI: soft, non-tender; bowel sounds normal; no masses,  no organomegaly Extremities: extremities normal, atraumatic, no cyanosis or edema Neurologic: Alert and oriented X 3, normal strength and tone. Normal symmetric reflexes. Normal coordination and gait  ECOG PERFORMANCE STATUS: 1 - Symptomatic but completely ambulatory  Blood pressure (!) 142/70, pulse 97, temperature 98.5 F (36.9 C), temperature source Oral, resp. rate 18, height 4' 10.5" (1.486 m), weight 163 lb 4.8 oz (74.1 kg), SpO2 100 %.  LABORATORY DATA: Lab Results  Component Value Date   WBC 11.0 (H) 09/11/2016   HGB 11.1 (L) 09/11/2016   HCT 32.6 (L) 09/11/2016   MCV 94.9 09/11/2016   PLT 469 (H) 09/11/2016      Chemistry      Component Value Date/Time   NA 141 09/11/2016 0952   K 3.3 (L) 09/11/2016 0952   CL 106 08/22/2016 0552   CO2 33 (H) 09/11/2016 0952   BUN 24.4 09/11/2016 0952   CREATININE 1.0 09/11/2016 0952      Component Value Date/Time   CALCIUM 10.1 09/11/2016 0952   ALKPHOS 129 09/11/2016 0952   AST 18 09/11/2016 0952   ALT 16 09/11/2016 0952   BILITOT 0.37 09/11/2016 0952       RADIOGRAPHIC STUDIES: Dg Chest 2 View  Result Date: 08/19/2016 CLINICAL DATA:  Fever, 5 days duration. Being treated for recurrent lung cancer. EXAM: CHEST  2 VIEW COMPARISON:  07/05/2016 FINDINGS: Left chest remains clear. There is newly seen infiltrate in the right lung, particularly in the upper lung, consistent with bronchopneumonia superimposed upon treatment changes, tumor and volume loss. IMPRESSION: Increased density in the right lung probably representing pneumonia superimposed upon treatment changes, tumor and volume loss. Electronically Signed   By: Nelson Chimes M.D.   On: 08/19/2016 08:22    ASSESSMENT AND PLAN: This is a very pleasant 76 years old white female with recurrent non-small cell lung cancer, squamous cell carcinoma. She status post concurrent chemoradiation followed  by consolidation chemotherapy and has been on observation for the last 2 years before she developed disease recurrence and she is currently undergoing systemic chemotherapy with carboplatin and paclitaxel. She is status post 1 cycle. She tolerated the first cycle except for the diarrhea that was secondary to C. Difficile. She is feeling much better today. I recommended for the patient to resume her systemic chemotherapy and she is expected to start the second cycle of her chemotherapy next week. For the chemotherapy-induced anemia, I encouraged the patient to continue on oral iron tablets for now. History of C. difficile, she completed treatment with oral vancomycin. For hypertension, she was advised to take her blood pressure medication was Toprol-XL and Norvasc. For COPD, she will continue her current treatment with  Singulair and albuterol. The patient would come back for follow-up visit in 4 weeks with the start of cycle #3. She was advised to call immediately if she has any concerning symptoms in the interval. The patient voices understanding of current disease status and treatment options and is in agreement with the current care plan.  All questions were answered. The patient knows to call the clinic with any problems, questions or concerns. We can certainly see the patient much sooner if necessary.  Disclaimer: This note was dictated with voice recognition software. Similar sounding words can inadvertently be transcribed and may not be corrected upon review.

## 2016-09-12 ENCOUNTER — Ambulatory Visit (HOSPITAL_COMMUNITY)
Admission: RE | Admit: 2016-09-12 | Discharge: 2016-09-12 | Disposition: A | Discharge status: Home / Self Care | Payer: Medicare Other | Source: Ambulatory Visit | Attending: Internal Medicine | Admitting: Internal Medicine

## 2016-09-12 ENCOUNTER — Other Ambulatory Visit: Payer: Self-pay | Admitting: Internal Medicine

## 2016-09-12 ENCOUNTER — Encounter (HOSPITAL_COMMUNITY): Payer: Self-pay

## 2016-09-12 DIAGNOSIS — Z5111 Encounter for antineoplastic chemotherapy: Secondary | ICD-10-CM | POA: Diagnosis not present

## 2016-09-12 DIAGNOSIS — I878 Other specified disorders of veins: Secondary | ICD-10-CM

## 2016-09-12 DIAGNOSIS — C3491 Malignant neoplasm of unspecified part of right bronchus or lung: Secondary | ICD-10-CM | POA: Diagnosis not present

## 2016-09-12 DIAGNOSIS — Z7982 Long term (current) use of aspirin: Secondary | ICD-10-CM | POA: Insufficient documentation

## 2016-09-12 DIAGNOSIS — Z923 Personal history of irradiation: Secondary | ICD-10-CM | POA: Diagnosis not present

## 2016-09-12 DIAGNOSIS — Z79899 Other long term (current) drug therapy: Secondary | ICD-10-CM | POA: Insufficient documentation

## 2016-09-12 DIAGNOSIS — I4891 Unspecified atrial fibrillation: Secondary | ICD-10-CM | POA: Diagnosis not present

## 2016-09-12 DIAGNOSIS — Z87891 Personal history of nicotine dependence: Secondary | ICD-10-CM | POA: Insufficient documentation

## 2016-09-12 DIAGNOSIS — Z9221 Personal history of antineoplastic chemotherapy: Secondary | ICD-10-CM | POA: Insufficient documentation

## 2016-09-12 DIAGNOSIS — I10 Essential (primary) hypertension: Secondary | ICD-10-CM | POA: Insufficient documentation

## 2016-09-12 DIAGNOSIS — Z452 Encounter for adjustment and management of vascular access device: Secondary | ICD-10-CM | POA: Diagnosis not present

## 2016-09-12 DIAGNOSIS — E785 Hyperlipidemia, unspecified: Secondary | ICD-10-CM | POA: Diagnosis not present

## 2016-09-12 DIAGNOSIS — Z96691 Finger-joint replacement of right hand: Secondary | ICD-10-CM | POA: Insufficient documentation

## 2016-09-12 DIAGNOSIS — E039 Hypothyroidism, unspecified: Secondary | ICD-10-CM | POA: Insufficient documentation

## 2016-09-12 HISTORY — DX: Personal history of antineoplastic chemotherapy: Z92.21

## 2016-09-12 HISTORY — DX: Unspecified asthma, uncomplicated: J45.909

## 2016-09-12 HISTORY — DX: Family history of other specified conditions: Z84.89

## 2016-09-12 HISTORY — PX: IR GENERIC HISTORICAL: IMG1180011

## 2016-09-12 HISTORY — DX: Personal history of other diseases of the respiratory system: Z87.09

## 2016-09-12 HISTORY — DX: Personal history of other medical treatment: Z92.89

## 2016-09-12 LAB — CBC WITH DIFFERENTIAL/PLATELET
Basophils Absolute: 0.1 10*3/uL (ref 0.0–0.1)
Basophils Relative: 1 %
Eosinophils Absolute: 0.3 10*3/uL (ref 0.0–0.7)
Eosinophils Relative: 2 %
HEMATOCRIT: 32.5 % — AB (ref 36.0–46.0)
HEMOGLOBIN: 11.1 g/dL — AB (ref 12.0–15.0)
LYMPHS ABS: 1.4 10*3/uL (ref 0.7–4.0)
LYMPHS PCT: 12 %
MCH: 32.3 pg (ref 26.0–34.0)
MCHC: 34.2 g/dL (ref 30.0–36.0)
MCV: 94.5 fL (ref 78.0–100.0)
Monocytes Absolute: 0.9 10*3/uL (ref 0.1–1.0)
Monocytes Relative: 8 %
NEUTROS ABS: 9 10*3/uL — AB (ref 1.7–7.7)
NEUTROS PCT: 77 %
Platelets: 486 10*3/uL — ABNORMAL HIGH (ref 150–400)
RBC: 3.44 MIL/uL — AB (ref 3.87–5.11)
RDW: 14.8 % (ref 11.5–15.5)
WBC: 11.6 10*3/uL — AB (ref 4.0–10.5)

## 2016-09-12 LAB — PROTIME-INR
INR: 0.86
PROTHROMBIN TIME: 11.7 s (ref 11.4–15.2)

## 2016-09-12 MED ORDER — CEFAZOLIN SODIUM-DEXTROSE 2-4 GM/100ML-% IV SOLN
2.0000 g | INTRAVENOUS | Status: AC
  Administered 2016-09-12: 2 g via INTRAVENOUS
  Filled 2016-09-12: qty 100

## 2016-09-12 MED ORDER — HEPARIN SOD (PORK) LOCK FLUSH 100 UNIT/ML IV SOLN
INTRAVENOUS | Status: AC
  Filled 2016-09-12: qty 5

## 2016-09-12 MED ORDER — FENTANYL CITRATE (PF) 100 MCG/2ML IJ SOLN
INTRAMUSCULAR | Status: AC | PRN
  Administered 2016-09-12: 25 ug via INTRAVENOUS
  Administered 2016-09-12: 50 ug via INTRAVENOUS

## 2016-09-12 MED ORDER — MIDAZOLAM HCL 2 MG/2ML IJ SOLN
INTRAMUSCULAR | Status: AC
  Filled 2016-09-12: qty 6

## 2016-09-12 MED ORDER — SODIUM CHLORIDE 0.9 % IV SOLN
INTRAVENOUS | Status: DC
  Administered 2016-09-12: 08:00:00 via INTRAVENOUS

## 2016-09-12 MED ORDER — HEPARIN SOD (PORK) LOCK FLUSH 100 UNIT/ML IV SOLN
INTRAVENOUS | Status: AC | PRN
  Administered 2016-09-12: 500 [IU]

## 2016-09-12 MED ORDER — FENTANYL CITRATE (PF) 100 MCG/2ML IJ SOLN
INTRAMUSCULAR | Status: AC
  Filled 2016-09-12: qty 4

## 2016-09-12 MED ORDER — MIDAZOLAM HCL 2 MG/2ML IJ SOLN
INTRAMUSCULAR | Status: AC | PRN
  Administered 2016-09-12 (×5): 1 mg via INTRAVENOUS

## 2016-09-12 MED ORDER — LIDOCAINE-EPINEPHRINE (PF) 2 %-1:200000 IJ SOLN
INTRAMUSCULAR | Status: AC
  Filled 2016-09-12: qty 20

## 2016-09-12 MED ORDER — LIDOCAINE-EPINEPHRINE (PF) 2 %-1:200000 IJ SOLN
INTRAMUSCULAR | Status: AC | PRN
  Administered 2016-09-12: 20 mL

## 2016-09-12 NOTE — Discharge Instructions (Signed)
Implanted Port Home Guide °An implanted port is a type of central line that is placed under the skin. Central lines are used to provide IV access when treatment or nutrition needs to be given through a person's veins. Implanted ports are used for long-term IV access. An implanted port may be placed because:  °· You need IV medicine that would be irritating to the small veins in your hands or arms.   °· You need long-term IV medicines, such as antibiotics.   °· You need IV nutrition for a long period.   °· You need frequent blood draws for lab tests.   °· You need dialysis.   °Implanted ports are usually placed in the chest area, but they can also be placed in the upper arm, the abdomen, or the leg. An implanted port has two main parts:  °· Reservoir. The reservoir is round and will appear as a small, raised area under your skin. The reservoir is the part where a needle is inserted to give medicines or draw blood.   °· Catheter. The catheter is a thin, flexible tube that extends from the reservoir. The catheter is placed into a large vein. Medicine that is inserted into the reservoir goes into the catheter and then into the vein.   °HOW WILL I CARE FOR MY INCISION SITE? °Do not get the incision site wet. Bathe or shower as directed by your health care provider.  °HOW IS MY PORT ACCESSED? °Special steps must be taken to access the port:  °· Before the port is accessed, a numbing cream can be placed on the skin. This helps numb the skin over the port site.   °· Your health care provider uses a sterile technique to access the port. °¨ Your health care provider must put on a mask and sterile gloves. °¨ The skin over your port is cleaned carefully with an antiseptic and allowed to dry. °¨ The port is gently pinched between sterile gloves, and a needle is inserted into the port. °· Only "non-coring" port needles should be used to access the port. Once the port is accessed, a blood return should be checked. This helps  ensure that the port is in the vein and is not clogged.   °· If your port needs to remain accessed for a constant infusion, a clear (transparent) bandage will be placed over the needle site. The bandage and needle will need to be changed every week, or as directed by your health care provider.   °· Keep the bandage covering the needle clean and dry. Do not get it wet. Follow your health care provider's instructions on how to take a shower or bath while the port is accessed.   °· If your port does not need to stay accessed, no bandage is needed over the port.   °WHAT IS FLUSHING? °Flushing helps keep the port from getting clogged. Follow your health care provider's instructions on how and when to flush the port. Ports are usually flushed with saline solution or a medicine called heparin. The need for flushing will depend on how the port is used.  °· If the port is used for intermittent medicines or blood draws, the port will need to be flushed:   °¨ After medicines have been given.   °¨ After blood has been drawn.   °¨ As part of routine maintenance.   °· If a constant infusion is running, the port may not need to be flushed.   °HOW LONG WILL MY PORT STAY IMPLANTED? °The port can stay in for as long as your health care   provider thinks it is needed. When it is time for the port to come out, surgery will be done to remove it. The procedure is similar to the one performed when the port was put in.  WHEN SHOULD I SEEK IMMEDIATE MEDICAL CARE? When you have an implanted port, you should seek immediate medical care if:   You notice a bad smell coming from the incision site.   You have swelling, redness, or drainage at the incision site.   You have more swelling or pain at the port site or the surrounding area.   You have a fever that is not controlled with medicine. This information is not intended to replace advice given to you by your health care provider. Make sure you discuss any questions you have with  your health care provider. Document Released: 08/21/2005 Document Revised: 06/11/2013 Document Reviewed: 04/28/2013 Elsevier Interactive Patient Education  2017 [place] Insertion, Care After Refer to this sheet in the next few weeks. These instructions provide you with information on caring for yourself after your procedure. Your health care provider may also give you more specific instructions. Your treatment has been planned according to current medical practices, but problems sometimes occur. Call your health care provider if you have any problems or questions after your procedure. WHAT TO EXPECT AFTER THE PROCEDURE After your procedure, it is typical to have the following:   Discomfort at the port insertion site. Ice packs to the area will help.  Bruising on the skin over the port. This will subside in 3-4 days. HOME CARE INSTRUCTIONS  After your port is placed, you will get a manufacturer's information card. The card has information about your port. Keep this card with you at all times.   Know what kind of port you have. There are many types of ports available.   Wear a medical alert bracelet in case of an emergency. This can help alert health care workers that you have a port.   The port can stay in for as long as your health care provider believes it is necessary.   A home health care nurse may give medicines and take care of the port.   You or a family member can get special training and directions for giving medicine and taking care of the port at home.  SEEK MEDICAL CARE IF:   Your port does not flush or you are unable to get a blood return.   You have a fever or chills. SEEK IMMEDIATE MEDICAL CARE IF:  You have new fluid or pus coming from your incision.   You notice a bad smell coming from your incision site.   You have swelling, pain, or more redness at the incision or port site.   You have chest pain or shortness of breath. This  information is not intended to replace advice given to you by your health care provider. Make sure you discuss any questions you have with your health care provider. Document Released: 06/11/2013 Document Revised: 08/26/2013 Document Reviewed: 06/11/2013 Elsevier Interactive Patient Education  2017 [place]. Moderate Conscious Sedation, Adult, Care After These instructions provide you with information about caring for yourself after your procedure. Your health care provider may also give you more specific instructions. Your treatment has been planned according to current medical practices, but problems sometimes occur. Call your health care provider if you have any problems or questions after your procedure. What can I expect after the procedure? After your procedure, it is common:  To feel sleepy for several hours.  To feel clumsy and have poor balance for several hours.  To have poor judgment for several hours.  To vomit if you eat too soon. Follow these instructions at home: For at least 24 hours after the procedure:   Do not:  Participate in activities where you could fall or become injured.  Drive.  Use heavy machinery.  Drink alcohol.  Take sleeping pills or medicines that cause drowsiness.  Make important decisions or sign legal documents.  Take care of children on your own.  Rest. Eating and drinking  Follow the diet recommended by your health care provider.  If you vomit:  Drink water, juice, or soup when you can drink without vomiting.  Make sure you have little or no nausea before eating solid foods. General instructions  Have a responsible adult stay with you until you are awake and alert.  Take over-the-counter and prescription medicines only as told by your health care provider.  If you smoke, do not smoke without supervision.  Keep all follow-up visits as told by your health care provider. This is important. Contact a health care provider  if:  You keep feeling nauseous or you keep vomiting.  You feel light-headed.  You develop a rash.  You have a fever. Get help right away if:  You have trouble breathing. This information is not intended to replace advice given to you by your health care provider. Make sure you discuss any questions you have with your health care provider. Document Released: 06/11/2013 Document Revised: 01/24/2016 Document Reviewed: 12/11/2015 Elsevier Interactive Patient Education  2017 [employer].

## 2016-09-12 NOTE — Procedures (Signed)
Interventional Radiology Procedure Note  Procedure: Placement of a right IJ approach single lumen PowerPort.  Tip is positioned at the superior cavoatrial junction and catheter is ready for immediate use.  Complications: No immediate Recommendations:  - Ok to shower tomorrow - Do not submerge for 7 days - Routine line care   Signed,  Heath K. McCullough, MD   

## 2016-09-12 NOTE — Consult Note (Signed)
Chief Complaint: Patient was seen in consultation today for Port-A-Cath placement  Referring Physician(s): [name],[name]  Supervising Physician: [name]  Patient Status: [facility] - Out-pt  History of Present Illness: Kimberly [name] is a 76 y.o. female with recurrent non-small cell lung cancer, squamous cell carcinoma. She status post concurrent chemoradiation followed by consolidation chemotherapy and has been on observation for the last 2 years before she developed disease recurrence and she is currently undergoing systemic chemotherapy with carboplatin and paclitaxel.She has poor venous access and presents today for Port-A-Cath placement.  Past Medical History:  Diagnosis Date  . Allergic asthma without acute exacerbation or status asthmaticus   . Aortic insufficiency   . Arthritis   . Atrial fibrillation ([place])   . Back pain 08/14/2016  . Bronchitis   . Cough    dry, endobronchial mass  . Dyslipidemia   . Family history of adverse reaction to anesthesia    pts son also experiences N/V  . GERD (gastroesophageal reflux disease)   . Heart murmur   . History of blood transfusion   . History of bronchitis   . History of chemotherapy   . History of nuclear stress test 02/26/2006   exercise myoview; normal pattern of perfusion; low risk scan   . Hypertension   . Hypothyroidism   . Mitral insufficiency   . Pneumonia   . PONV (postoperative nausea and vomiting)   . Radiation 12/01/13-01/08/14   50.4 gray to right central chest  . Shortness of breath    with exertion  . Squamous cell carcinoma of lung ([place])   . Syncope    "states she has passed out a few times. dr is trying to find cause.last time when geeting ready to go home after video bronch/bx  . Thyroid disease     Past Surgical History:  Procedure Laterality Date  . CARDIAC CATHETERIZATION  07/08/2008   normal coronaries  . CARPAL TUNNEL RELEASE Right 1988  . COLONOSCOPY N/A 02/11/2016   Procedure:  COLONOSCOPY;  Surgeon: Carol Ada, MD;  Location: [facility];  Service: Endoscopy;  Laterality: N/A;  . DILATION AND CURETTAGE OF UTERUS    . EYE SURGERY Bilateral   . H/O MET Test w/PFT  04/02/2012   low risk; peak VO2 77% predicted  . JOINT REPLACEMENT  2003   thumb rt  . KNEE ARTHROSCOPY  12   rt meniscus  . MEDIASTINOSCOPY N/A 10/30/2013   Procedure: MEDIASTINOSCOPY;  Surgeon: Melrose Nakayama, MD;  Location: [facility];  Service: Thoracic;  Laterality: N/A;  . ROTATOR CUFF REPAIR  2006   ? side  . THYROIDECTOMY  1973  . TRANSTHORACIC ECHOCARDIOGRAM  09/25/2012   EF 55-60%; mild LVH & mild concentric hypertrophy; mild AV regurg; RV systolic pressure increase consistent with mild pulm HTN  . VIDEO ASSISTED THORACOSCOPY (VATS)/ LOBECTOMY Right 11/13/2013   Procedure: VIDEO ASSISTED THORACOSCOPY (VATS) with mediastinal  biopsies;  Surgeon: Melrose Nakayama, MD;  Location: [facility];  Service: Thoracic;  Laterality: Right;  RIGHT VATS,mediastinal biopsies  . VIDEO BRONCHOSCOPY Bilateral 09/25/2013   Procedure: VIDEO BRONCHOSCOPY WITHOUT FLUORO;  Surgeon: Tanda Rockers, MD;  Location: [facility];  Service: Cardiopulmonary;  Laterality: Bilateral;  . VIDEO BRONCHOSCOPY WITH ENDOBRONCHIAL ULTRASOUND N/A 10/30/2013   Procedure: VIDEO BRONCHOSCOPY WITH ENDOBRONCHIAL ULTRASOUND;  Surgeon: Melrose Nakayama, MD;  Location: [facility];  Service: Thoracic;  Laterality: N/A;    Allergies: Lactose intolerance (gi); Amiodarone; and Milk-related compounds  Medications: Prior to Admission medications  Medication Sig Start Date End Date Taking? Authorizing Provider  albuterol (PROVENTIL HFA;VENTOLIN HFA) 108 (90 BASE) MCG/ACT inhaler Inhale 1 puff into the lungs every 6 (six) hours as needed for wheezing or shortness of breath.    Yes Historical Provider, MD  amLODipine (NORVASC) 5 MG tablet Take 1 tablet (5 mg total) by mouth daily. 08/23/16  Yes Donne Hazel, MD  aspirin EC 81 MG tablet Take 81  mg by mouth daily.   Yes Historical Provider, MD  atorvastatin (LIPITOR) 20 MG tablet Take 20 mg by mouth every morning.    Yes Historical Provider, MD  Calcium Carb-Cholecalciferol (CALCIUM 600 + D PO) Take 1 tablet by mouth daily.   Yes Historical Provider, MD  chlorthalidone (HYGROTON) 25 MG tablet Take 1 tablet (25 mg total) by mouth daily. 09/28/15  Yes Pixie Casino, MD  Cholecalciferol (VITAMIN D3) 1000 units CAPS Take 1,000 Units by mouth daily.   Yes Historical Provider, MD  levothyroxine (SYNTHROID, LEVOTHROID) 150 MCG tablet Take 150 mcg by mouth as directed. Take 1 tablet once every Tuesday, Thursday, Saturday, and Sunday.   Yes Historical Provider, MD  montelukast (SINGULAIR) 10 MG tablet Take 10 mg by mouth at bedtime.  04/15/13  Yes Historical Provider, MD  saccharomyces boulardii (FLORASTOR) 250 MG capsule Take 1 capsule (250 mg total) by mouth 2 (two) times daily. 08/22/16  Yes Donne Hazel, MD  diphenoxylate-atropine (LOMOTIL) 2.5-0.025 MG tablet Take 2 tablets by mouth 4 (four) times daily as needed for diarrhea or loose stools. 08/18/16   Susanne Borders, NP  lidocaine-prilocaine (EMLA) cream Apply 1 application topically as needed. 08/18/16   Susanne Borders, NP  loperamide (IMODIUM) 2 MG capsule Take 2 mg by mouth as needed for diarrhea or loose stools.    Historical Provider, MD  meloxicam (MOBIC) 7.5 MG tablet Take 7.5 mg by mouth 2 (two) times daily as needed for pain.    Historical Provider, MD  methocarbamol (ROBAXIN) 500 MG tablet Take 500 mg by mouth every 8 (eight) hours as needed for muscle spasms.    Historical Provider, MD  metoprolol succinate (TOPROL-XL) 25 MG 24 hr tablet Take one-half tablet by  mouth daily 01/14/16   Pixie Casino, MD  Multiple Vitamins-Iron (MULTIVITAMIN/IRON PO) Take 1 tablet by mouth daily.     Historical Provider, MD  ondansetron (ZOFRAN) 8 MG tablet Take 1 tablet (8 mg total) by mouth every 8 (eight) hours as needed for refractory nausea /  vomiting (start on Day 3 after each treatment). 08/14/16   Curt Bears, MD  prochlorperazine (COMPAZINE) 10 MG tablet Take 1 tablet (10 mg total) by mouth every 6 (six) hours as needed for nausea or vomiting. 08/14/16   Curt Bears, MD  vancomycin (VANCOCIN) 50 mg/mL oral solution Take 2.5 mLs (125 mg total) by mouth 4 (four) times daily. 08/22/16   Donne Hazel, MD     Family History  Problem Relation Age of Onset  . Lung cancer Mother   . Heart attack Father   . Heart failure Maternal Grandfather   . Heart attack Paternal Grandfather     Social History   Social History  . Marital status: Divorced    Spouse name: N/A  . Number of children: 2  . Years of education: N/A   Social History Main Topics  . Smoking status: Former Smoker    Packs/day: 0.50    Years: 6.00    Types: Cigarettes    Quit  date: 09/05/1967  . Smokeless tobacco: Never Used     Comment: occ wine  . Alcohol use Yes     Comment: occasional 1-2 drinks/month  . Drug use: No  . Sexual activity: Not Currently   Other Topics Concern  . None   Social History Narrative  . None      Review of Systems Denies fever, headache, chest pain,  abdominal/back pain, nausea, vomiting or bleeding.  Does have dyspnea with exertion, intermittent coughing, peripheral neuropathy  Vital Signs: BP (!) 146/74 (BP Location: Left Arm)   Pulse 74   Temp 98.7 F (37.1 C) (Oral)   Resp 18   Ht 4' 10.5" (1.486 m)   Wt 159 lb (72.1 kg)   SpO2 99%   BMI 32.67 kg/m   Physical Exam Awake alert. Chest clear to auscultation bilaterally. Heart with regular rate and rhythm. Abdomen soft, positive bowel sounds, nontender. Lower extremities with no edema  Mallampati Score:     Imaging: Dg Chest 2 View  Result Date: 08/19/2016 CLINICAL DATA:  Fever, 5 days duration. Being treated for recurrent lung cancer. EXAM: CHEST  2 VIEW COMPARISON:  07/05/2016 FINDINGS: Left chest remains clear. There is newly seen infiltrate in  the right lung, particularly in the upper lung, consistent with bronchopneumonia superimposed upon treatment changes, tumor and volume loss. IMPRESSION: Increased density in the right lung probably representing pneumonia superimposed upon treatment changes, tumor and volume loss. Electronically Signed   By: Nelson Chimes M.D.   On: 08/19/2016 08:22    Labs:  CBC:  Recent Labs  08/21/16 0559 08/29/16 1401 09/11/16 0952 09/12/16 0753  WBC 3.4* 10.6* 11.0* 11.6*  HGB 9.3* 10.2* 11.1* 11.1*  HCT 27.4* 29.9* 32.6* 32.5*  PLT 165 157 469* 486*    COAGS:  Recent Labs  09/12/16 0753  INR 0.86    BMP:  Recent Labs  08/19/16 0801 08/20/16 0544 08/21/16 0559 08/22/16 0552 08/29/16 1401 09/11/16 0952  NA 131* 132* 136 136 137 141  K 3.0* 3.3* 4.7 4.5 3.8 3.3*  CL 94* 100* 105 106  --   --   CO2 '26 27 25 25 ' 31* 33*  GLUCOSE 143* 85 85 87 117 143*  BUN 22* 12 5* 6 23.2 24.4  CALCIUM 8.4* 7.8* 8.1* 8.3* 10.3 10.1  CREATININE 1.16* 0.83 0.75 0.81 0.9 1.0  GFRNONAA 46* >60 >60 >60  --   --   GFRAA 53* >60 >60 >60  --   --     LIVER FUNCTION TESTS:  Recent Labs  08/18/16 1415 08/19/16 0801 08/29/16 1401 09/11/16 0952  BILITOT 1.05 1.0 0.30 0.37  AST 34 35 22 18  ALT '28 28 29 16  ' ALKPHOS 114 85 111 129  PROT 7.0 6.5 7.3 7.1  ALBUMIN 3.5 3.6 3.3* 3.6    TUMOR MARKERS: No results for input(s): AFPTM, CEA, CA199, CHROMGRNA in the last 8760 hours.  Assessment and Plan: 76 y.o. female with recurrent non-small cell lung cancer, squamous cell carcinoma. She status post concurrent chemoradiation followed by consolidation chemotherapy and has been on observation for the last 2 years before she developed disease recurrence and she is currently undergoing systemic chemotherapy with carboplatin and paclitaxel. She recently completed treatment for C diff. She has poor venous access and presents today for Port-A-Cath placement. Risks and benefits discussed with the patient  including, but not limited to bleeding, infection, pneumothorax, or fibrin sheath development and need for additional procedures.All of the patient's questions were  answered, patient is agreeable to proceed.Consent signed and in chart.     Thank you for this interesting consult.  I greatly enjoyed meeting ADEA GEISEL and look forward to participating in their care.  A copy of this report was sent to the requesting provider on this date.  Electronically Signed: D. Rowe Robert 09/12/2016, 8:46 AM   I spent a total of 20 minutes  in face to face in clinical consultation, greater than 50% of which was counseling/coordinating care for Port-A-Cath placement

## 2016-09-15 DIAGNOSIS — R197 Diarrhea, unspecified: Secondary | ICD-10-CM | POA: Diagnosis not present

## 2016-09-18 ENCOUNTER — Other Ambulatory Visit: Payer: Medicare Other

## 2016-09-18 ENCOUNTER — Other Ambulatory Visit (HOSPITAL_BASED_OUTPATIENT_CLINIC_OR_DEPARTMENT_OTHER): Payer: Medicare Other

## 2016-09-18 ENCOUNTER — Ambulatory Visit (HOSPITAL_BASED_OUTPATIENT_CLINIC_OR_DEPARTMENT_OTHER): Payer: Medicare Other

## 2016-09-18 VITALS — BP 147/75 | HR 89 | Temp 98.0°F | Resp 20

## 2016-09-18 DIAGNOSIS — Z5111 Encounter for antineoplastic chemotherapy: Secondary | ICD-10-CM | POA: Diagnosis not present

## 2016-09-18 DIAGNOSIS — C342 Malignant neoplasm of middle lobe, bronchus or lung: Secondary | ICD-10-CM | POA: Diagnosis not present

## 2016-09-18 LAB — COMPREHENSIVE METABOLIC PANEL
ALBUMIN: 3.5 g/dL (ref 3.5–5.0)
ALK PHOS: 127 U/L (ref 40–150)
ALT: 13 U/L (ref 0–55)
AST: 17 U/L (ref 5–34)
Anion Gap: 11 mEq/L (ref 3–11)
BUN: 23.4 mg/dL (ref 7.0–26.0)
CO2: 29 mEq/L (ref 22–29)
Calcium: 9.9 mg/dL (ref 8.4–10.4)
Chloride: 102 mEq/L (ref 98–109)
Creatinine: 1 mg/dL (ref 0.6–1.1)
EGFR: 57 mL/min/{1.73_m2} — ABNORMAL LOW (ref >90)
GLUCOSE: 136 mg/dL (ref 70–140)
POTASSIUM: 3.7 meq/L (ref 3.5–5.1)
SODIUM: 142 meq/L (ref 136–145)
Total Bilirubin: 0.54 mg/dL (ref 0.20–1.20)
Total Protein: 7 g/dL (ref 6.4–8.3)

## 2016-09-18 LAB — CBC WITH DIFFERENTIAL/PLATELET
BASO%: 0.7 % (ref 0.0–2.0)
BASOS ABS: 0.1 10*3/uL (ref 0.0–0.1)
EOS%: 1.7 % (ref 0.0–7.0)
Eosinophils Absolute: 0.2 10*3/uL (ref 0.0–0.5)
HCT: 34.1 % — ABNORMAL LOW (ref 34.8–46.6)
HEMOGLOBIN: 11.5 g/dL — AB (ref 11.6–15.9)
LYMPH%: 9.1 % — ABNORMAL LOW (ref 14.0–49.7)
MCH: 32.4 pg (ref 25.1–34.0)
MCHC: 33.7 g/dL (ref 31.5–36.0)
MCV: 96.1 fL (ref 79.5–101.0)
MONO#: 0.9 10*3/uL (ref 0.1–0.9)
MONO%: 6.6 % (ref 0.0–14.0)
NEUT#: 11.1 10*3/uL — ABNORMAL HIGH (ref 1.5–6.5)
NEUT%: 81.9 % — ABNORMAL HIGH (ref 38.4–76.8)
Platelets: 321 10*3/uL (ref 145–400)
RBC: 3.54 10*6/uL — ABNORMAL LOW (ref 3.70–5.45)
RDW: 14.8 % — AB (ref 11.2–14.5)
WBC: 13.6 10*3/uL — AB (ref 3.9–10.3)
lymph#: 1.2 10*3/uL (ref 0.9–3.3)

## 2016-09-18 MED ORDER — PALONOSETRON HCL INJECTION 0.25 MG/5ML
0.2500 mg | Freq: Once | INTRAVENOUS | Status: AC
  Administered 2016-09-18: 0.25 mg via INTRAVENOUS

## 2016-09-18 MED ORDER — SODIUM CHLORIDE 0.9 % IV SOLN
20.0000 mg | Freq: Once | INTRAVENOUS | Status: AC
  Administered 2016-09-18: 20 mg via INTRAVENOUS
  Filled 2016-09-18: qty 2

## 2016-09-18 MED ORDER — PACLITAXEL CHEMO INJECTION 300 MG/50ML
172.0000 mg/m2 | Freq: Once | INTRAVENOUS | Status: AC
  Administered 2016-09-18: 300 mg via INTRAVENOUS
  Filled 2016-09-18: qty 50

## 2016-09-18 MED ORDER — SODIUM CHLORIDE 0.9 % IV SOLN
420.0000 mg | Freq: Once | INTRAVENOUS | Status: AC
  Administered 2016-09-18: 420 mg via INTRAVENOUS
  Filled 2016-09-18: qty 42

## 2016-09-18 MED ORDER — DIPHENHYDRAMINE HCL 50 MG/ML IJ SOLN
INTRAMUSCULAR | Status: AC
  Filled 2016-09-18: qty 1

## 2016-09-18 MED ORDER — PALONOSETRON HCL INJECTION 0.25 MG/5ML
INTRAVENOUS | Status: AC
  Filled 2016-09-18: qty 5

## 2016-09-18 MED ORDER — SODIUM CHLORIDE 0.9 % IV SOLN
Freq: Once | INTRAVENOUS | Status: AC
  Administered 2016-09-18: 11:00:00 via INTRAVENOUS

## 2016-09-18 MED ORDER — FAMOTIDINE IN NACL 20-0.9 MG/50ML-% IV SOLN
INTRAVENOUS | Status: AC
  Filled 2016-09-18: qty 50

## 2016-09-18 MED ORDER — SODIUM CHLORIDE 0.9% FLUSH
10.0000 mL | INTRAVENOUS | Status: DC | PRN
Start: 2016-09-18 — End: 2016-09-18
  Administered 2016-09-18: 10 mL
  Filled 2016-09-18: qty 10

## 2016-09-18 MED ORDER — DIPHENHYDRAMINE HCL 50 MG/ML IJ SOLN
50.0000 mg | Freq: Once | INTRAMUSCULAR | Status: AC
  Administered 2016-09-18: 50 mg via INTRAVENOUS

## 2016-09-18 MED ORDER — HEPARIN SOD (PORK) LOCK FLUSH 100 UNIT/ML IV SOLN
500.0000 [IU] | Freq: Once | INTRAVENOUS | Status: AC | PRN
  Administered 2016-09-18: 500 [IU]
  Filled 2016-09-18: qty 5

## 2016-09-18 MED ORDER — CARBOPLATIN CHEMO INTRADERMAL TEST DOSE 100MCG/0.02ML
100.0000 ug | Freq: Once | INTRADERMAL | Status: AC
  Administered 2016-09-18: 100 ug via INTRADERMAL
  Filled 2016-09-18: qty 0.02

## 2016-09-18 MED ORDER — FAMOTIDINE IN NACL 20-0.9 MG/50ML-% IV SOLN
20.0000 mg | Freq: Once | INTRAVENOUS | Status: AC
  Administered 2016-09-18: 20 mg via INTRAVENOUS

## 2016-09-18 NOTE — Patient Instructions (Addendum)
[facility] Discharge Instructions for Patients Receiving Chemotherapy  Today you received the following chemotherapy agents Taxol and Carboplatin   To help prevent nausea and vomiting after your treatment, we enco aurage you to take your nausea medication as directed. No Zofran for 3 days. Take Compazine instead.    If you develop nausea and vomiting that is not controlled by your nausea medication, call the clinic.   BELOW ARE SYMPTOMS THAT SHOULD BE REPORTED IMMEDIATELY:  *FEVER GREATER THAN 100.5 F  *CHILLS WITH OR WITHOUT FEVER  NAUSEA AND VOMITING THAT IS NOT CONTROLLED WITH YOUR NAUSEA MEDICATION  *UNUSUAL SHORTNESS OF BREATH  *UNUSUAL BRUISING OR BLEEDING  TENDERNESS IN MOUTH AND THROAT WITH OR WITHOUT PRESENCE OF ULCERS  *URINARY PROBLEMS  *BOWEL PROBLEMS  UNUSUAL RASH Items with * indicate a potential emergency and should be followed up as soon as possible.  Feel free to call the clinic you have any questions or concerns. The clinic phone number is [phone].  Please show the [facility] at check-in to the Emergency Department and triage nurse.

## 2016-09-20 ENCOUNTER — Ambulatory Visit: Payer: Medicare Other

## 2016-09-21 ENCOUNTER — Telehealth: Payer: Self-pay | Admitting: Internal Medicine

## 2016-09-21 NOTE — Telephone Encounter (Signed)
Appointment rescheduled per weather.

## 2016-09-22 ENCOUNTER — Ambulatory Visit (HOSPITAL_BASED_OUTPATIENT_CLINIC_OR_DEPARTMENT_OTHER): Payer: Medicare Other

## 2016-09-22 VITALS — BP 126/77 | HR 85 | Temp 98.3°F | Resp 18

## 2016-09-22 DIAGNOSIS — C342 Malignant neoplasm of middle lobe, bronchus or lung: Secondary | ICD-10-CM

## 2016-09-22 DIAGNOSIS — Z5189 Encounter for other specified aftercare: Secondary | ICD-10-CM

## 2016-09-22 MED ORDER — PEGFILGRASTIM INJECTION 6 MG/0.6ML ~~LOC~~
6.0000 mg | PREFILLED_SYRINGE | Freq: Once | SUBCUTANEOUS | Status: AC
  Administered 2016-09-22: 6 mg via SUBCUTANEOUS
  Filled 2016-09-22: qty 0.6

## 2016-09-25 ENCOUNTER — Ambulatory Visit: Payer: Medicare Other | Admitting: Internal Medicine

## 2016-09-25 ENCOUNTER — Other Ambulatory Visit (HOSPITAL_BASED_OUTPATIENT_CLINIC_OR_DEPARTMENT_OTHER): Payer: Medicare Other

## 2016-09-25 ENCOUNTER — Other Ambulatory Visit: Payer: Medicare Other

## 2016-09-25 ENCOUNTER — Ambulatory Visit: Payer: Medicare Other

## 2016-09-25 DIAGNOSIS — C342 Malignant neoplasm of middle lobe, bronchus or lung: Secondary | ICD-10-CM | POA: Diagnosis not present

## 2016-09-25 LAB — CBC WITH DIFFERENTIAL/PLATELET
BASO%: 0.5 % (ref 0.0–2.0)
Basophils Absolute: 0 10*3/uL (ref 0.0–0.1)
EOS%: 9.1 % — AB (ref 0.0–7.0)
Eosinophils Absolute: 0.2 10*3/uL (ref 0.0–0.5)
HCT: 31.6 % — ABNORMAL LOW (ref 34.8–46.6)
HEMOGLOBIN: 10.8 g/dL — AB (ref 11.6–15.9)
LYMPH%: 32.7 % (ref 14.0–49.7)
MCH: 32.2 pg (ref 25.1–34.0)
MCHC: 34.2 g/dL (ref 31.5–36.0)
MCV: 94.3 fL (ref 79.5–101.0)
MONO#: 0.1 10*3/uL (ref 0.1–0.9)
MONO%: 5.3 % (ref 0.0–14.0)
NEUT%: 52.4 % (ref 38.4–76.8)
NEUTROS ABS: 1.1 10*3/uL — AB (ref 1.5–6.5)
Platelets: 135 10*3/uL — ABNORMAL LOW (ref 145–400)
RBC: 3.35 10*6/uL — ABNORMAL LOW (ref 3.70–5.45)
RDW: 14.1 % (ref 11.2–14.5)
WBC: 2.1 10*3/uL — AB (ref 3.9–10.3)
lymph#: 0.7 10*3/uL — ABNORMAL LOW (ref 0.9–3.3)

## 2016-09-25 LAB — COMPREHENSIVE METABOLIC PANEL
ALBUMIN: 3.6 g/dL (ref 3.5–5.0)
ALK PHOS: 100 U/L (ref 40–150)
ALT: 35 U/L (ref 0–55)
AST: 28 U/L (ref 5–34)
Anion Gap: 11 mEq/L (ref 3–11)
BILIRUBIN TOTAL: 0.97 mg/dL (ref 0.20–1.20)
BUN: 26.6 mg/dL — AB (ref 7.0–26.0)
CO2: 32 meq/L — AB (ref 22–29)
Calcium: 9.9 mg/dL (ref 8.4–10.4)
Chloride: 95 mEq/L — ABNORMAL LOW (ref 98–109)
Creatinine: 1.2 mg/dL — ABNORMAL HIGH (ref 0.6–1.1)
EGFR: 44 mL/min/{1.73_m2} — ABNORMAL LOW (ref >90)
GLUCOSE: 114 mg/dL (ref 70–140)
POTASSIUM: 3.1 meq/L — AB (ref 3.5–5.1)
SODIUM: 137 meq/L (ref 136–145)
TOTAL PROTEIN: 6.9 g/dL (ref 6.4–8.3)

## 2016-09-26 ENCOUNTER — Ambulatory Visit (HOSPITAL_BASED_OUTPATIENT_CLINIC_OR_DEPARTMENT_OTHER): Payer: Medicare Other

## 2016-09-26 ENCOUNTER — Encounter: Payer: Self-pay | Admitting: Nurse Practitioner

## 2016-09-26 ENCOUNTER — Telehealth: Payer: Self-pay | Admitting: *Deleted

## 2016-09-26 ENCOUNTER — Ambulatory Visit (HOSPITAL_BASED_OUTPATIENT_CLINIC_OR_DEPARTMENT_OTHER): Payer: Medicare Other | Admitting: Nurse Practitioner

## 2016-09-26 ENCOUNTER — Telehealth: Payer: Self-pay

## 2016-09-26 VITALS — BP 131/66 | HR 97 | Temp 98.8°F | Resp 18 | Ht 58.5 in | Wt 160.7 lb

## 2016-09-26 DIAGNOSIS — E876 Hypokalemia: Secondary | ICD-10-CM

## 2016-09-26 DIAGNOSIS — E860 Dehydration: Secondary | ICD-10-CM

## 2016-09-26 DIAGNOSIS — D701 Agranulocytosis secondary to cancer chemotherapy: Secondary | ICD-10-CM

## 2016-09-26 DIAGNOSIS — C342 Malignant neoplasm of middle lobe, bronchus or lung: Secondary | ICD-10-CM

## 2016-09-26 DIAGNOSIS — R112 Nausea with vomiting, unspecified: Secondary | ICD-10-CM | POA: Diagnosis not present

## 2016-09-26 DIAGNOSIS — T451X5A Adverse effect of antineoplastic and immunosuppressive drugs, initial encounter: Secondary | ICD-10-CM

## 2016-09-26 LAB — COMPREHENSIVE METABOLIC PANEL
ALBUMIN: 3.6 g/dL (ref 3.5–5.0)
ALK PHOS: 104 U/L (ref 40–150)
ALT: 28 U/L (ref 0–55)
ANION GAP: 11 meq/L (ref 3–11)
AST: 17 U/L (ref 5–34)
BILIRUBIN TOTAL: 0.88 mg/dL (ref 0.20–1.20)
BUN: 26.9 mg/dL — ABNORMAL HIGH (ref 7.0–26.0)
CALCIUM: 10.3 mg/dL (ref 8.4–10.4)
CO2: 30 mEq/L — ABNORMAL HIGH (ref 22–29)
CREATININE: 1.3 mg/dL — AB (ref 0.6–1.1)
Chloride: 93 mEq/L — ABNORMAL LOW (ref 98–109)
EGFR: 40 mL/min/{1.73_m2} — AB (ref >90)
Glucose: 160 mg/dl — ABNORMAL HIGH (ref 70–140)
Potassium: 3 mEq/L — CL (ref 3.5–5.1)
Sodium: 135 mEq/L — ABNORMAL LOW (ref 136–145)
TOTAL PROTEIN: 6.6 g/dL (ref 6.4–8.3)

## 2016-09-26 LAB — CBC WITH DIFFERENTIAL/PLATELET
BASO%: 1.2 % (ref 0.0–2.0)
Basophils Absolute: 0 10*3/uL (ref 0.0–0.1)
EOS ABS: 0.1 10*3/uL (ref 0.0–0.5)
EOS%: 6.2 % (ref 0.0–7.0)
HEMATOCRIT: 31.1 % — AB (ref 34.8–46.6)
HEMOGLOBIN: 10.5 g/dL — AB (ref 11.6–15.9)
LYMPH#: 0.9 10*3/uL (ref 0.9–3.3)
LYMPH%: 38.2 % (ref 14.0–49.7)
MCH: 32.1 pg (ref 25.1–34.0)
MCHC: 33.7 g/dL (ref 31.5–36.0)
MCV: 95.2 fL (ref 79.5–101.0)
MONO#: 0.8 10*3/uL (ref 0.1–0.9)
MONO%: 36.6 % — ABNORMAL HIGH (ref 0.0–14.0)
NEUT%: 17.8 % — ABNORMAL LOW (ref 38.4–76.8)
NEUTROS ABS: 0.4 10*3/uL — AB (ref 1.5–6.5)
PLATELETS: 130 10*3/uL — AB (ref 145–400)
RBC: 3.27 10*6/uL — AB (ref 3.70–5.45)
RDW: 14.2 % (ref 11.2–14.5)
WBC: 2.3 10*3/uL — AB (ref 3.9–10.3)

## 2016-09-26 LAB — URINALYSIS, MICROSCOPIC - CHCC
BILIRUBIN (URINE): NEGATIVE
Blood: NEGATIVE
GLUCOSE UR CHCC: NEGATIVE mg/dL
KETONES: NEGATIVE mg/dL
Leukocyte Esterase: NEGATIVE
NITRITE: NEGATIVE
PROTEIN: NEGATIVE mg/dL
RBC / HPF: NEGATIVE (ref 0–2)
Specific Gravity, Urine: 1.005 (ref 1.003–1.035)
Urobilinogen, UR: 0.2 mg/dL (ref 0.2–1)
pH: 6 (ref 4.6–8.0)

## 2016-09-26 MED ORDER — HEPARIN SOD (PORK) LOCK FLUSH 100 UNIT/ML IV SOLN
500.0000 [IU] | Freq: Once | INTRAVENOUS | Status: AC
  Administered 2016-09-26: 500 [IU] via INTRAVENOUS
  Filled 2016-09-26: qty 5

## 2016-09-26 MED ORDER — SODIUM CHLORIDE 0.9 % IV SOLN
Freq: Once | INTRAVENOUS | Status: AC
  Administered 2016-09-26: 15:00:00 via INTRAVENOUS
  Filled 2016-09-26: qty 1000

## 2016-09-26 MED ORDER — SODIUM CHLORIDE 0.9% FLUSH
10.0000 mL | Freq: Once | INTRAVENOUS | Status: AC
  Administered 2016-09-26: 10 mL via INTRAVENOUS
  Filled 2016-09-26: qty 10

## 2016-09-26 MED ORDER — POTASSIUM CHLORIDE CRYS ER 20 MEQ PO TBCR: 20.0000 meq | EXTENDED_RELEASE_TABLET | Freq: Every day | ORAL | 0 refills | Status: DC

## 2016-09-26 NOTE — Assessment & Plan Note (Signed)
Patient's potassium is down to 3.0 today.  Dr. Julien Nordmann has are called in potassium tablets for the patient; but she has not picked them up as of yet.

## 2016-09-26 NOTE — Progress Notes (Signed)
SYMPTOM MANAGEMENT CLINIC    Chief Complaint: Dehydration, nausea/vomiting  HPI:  Kimberly [name] 76 y.o. female diagnosed with lung cancer.  Currently on Taxol/carboplatin chemotherapy regimen.   No history exists.    Review of Systems  Constitutional: Positive for chills and malaise/fatigue.  Gastrointestinal: Positive for nausea and vomiting.  All other systems reviewed and are negative.   Past Medical History:  Diagnosis Date  . Allergic asthma without acute exacerbation or status asthmaticus   . Aortic insufficiency   . Arthritis   . Atrial fibrillation ([place])   . Back pain 08/14/2016  . Bronchitis   . Cough    dry, endobronchial mass  . Dyslipidemia   . Family history of adverse reaction to anesthesia    pts son also experiences N/V  . GERD (gastroesophageal reflux disease)   . Heart murmur   . History of blood transfusion   . History of bronchitis   . History of chemotherapy   . History of nuclear stress test 02/26/2006   exercise myoview; normal pattern of perfusion; low risk scan   . Hypertension   . Hypothyroidism   . Mitral insufficiency   . Pneumonia   . PONV (postoperative nausea and vomiting)   . Radiation 12/01/13-01/08/14   50.4 gray to right central chest  . Shortness of breath    with exertion  . 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 Syncope    "states she has passed out a few times. dr is trying to find cause.last time when geeting ready to go home after video bronch/bx  . Thyroid disease     Past Surgical History:  Procedure Laterality Date  . CARDIAC CATHETERIZATION  07/08/2008   normal coronaries  . CARPAL TUNNEL RELEASE Right 1988  . COLONOSCOPY N/A 02/11/2016   Procedure: COLONOSCOPY;  Surgeon: Carol Ada, MD;  Location: [facility];  Service: Endoscopy;  Laterality: N/A;  . DILATION AND CURETTAGE OF UTERUS    . EYE SURGERY Bilateral   . H/O MET Test w/PFT  04/02/2012   low risk; peak VO2 77% predicted  . IR GENERIC HISTORICAL   09/12/2016   IR FLUORO GUIDE PORT INSERTION RIGHT 09/12/2016 [name], MD [facility]  . IR GENERIC HISTORICAL  09/12/2016   IR US GUIDE VASC ACCESS RIGHT 09/12/2016 [name], MD [facility]  . JOINT REPLACEMENT  2003   thumb rt  . KNEE ARTHROSCOPY  12   rt meniscus  . MEDIASTINOSCOPY N/A 10/30/2013   Procedure: MEDIASTINOSCOPY;  Surgeon: Melrose Nakayama, MD;  Location: [facility];  Service: Thoracic;  Laterality: N/A;  . ROTATOR CUFF REPAIR  2006   ? side  . THYROIDECTOMY  1973  . TRANSTHORACIC ECHOCARDIOGRAM  09/25/2012   EF 55-60%; mild LVH & mild concentric hypertrophy; mild AV regurg; RV systolic pressure increase consistent with mild pulm HTN  . VIDEO ASSISTED THORACOSCOPY (VATS)/ LOBECTOMY Right 11/13/2013   Procedure: VIDEO ASSISTED THORACOSCOPY (VATS) with mediastinal  biopsies;  Surgeon: Melrose Nakayama, MD;  Location: [facility];  Service: Thoracic;  Laterality: Right;  RIGHT VATS,mediastinal biopsies  . VIDEO BRONCHOSCOPY Bilateral 09/25/2013   Procedure: VIDEO BRONCHOSCOPY WITHOUT FLUORO;  Surgeon: Tanda Rockers, MD;  Location: [facility];  Service: Cardiopulmonary;  Laterality: Bilateral;  . VIDEO BRONCHOSCOPY WITH ENDOBRONCHIAL ULTRASOUND N/A 10/30/2013   Procedure: VIDEO BRONCHOSCOPY WITH ENDOBRONCHIAL ULTRASOUND;  Surgeon: Melrose Nakayama, MD;  Location: [facility];  Service: Thoracic;  Laterality: N/A;    has Hyperlipidemia; Essential  hypertension; ALLERGIC RHINITIS; Asthma; SHORTNESS OF BREATH (SOB); Syncope; Amaurosis fugax; Hemoptysis; Collapse of right lung: RML; Bronchial obstruction; Leukocytosis; Obstructive pneumonia; Lung cancer, middle lobe ([place]); Colitis; Bloody diarrhea; Atrial fibrillation with rapid ventricular response ([place]); Pleuritic chest pain; Paroxysmal atrial fibrillation ([place]); Diarrhea; Hyponatremia; Anemia; Symptomatic anemia; Other fatigue; Patient unable to exercise; Encounter for antineoplastic chemotherapy; Back pain; Poor venous access;  Dehydration; C. difficile colitis; Hypokalemia; Malignant neoplasm of right lung ([place]); Nausea with vomiting; Hypomagnesemia; Community acquired pneumonia of right upper lobe of lung ([place]); and Chemotherapy induced neutropenia ([place]) on her problem list.    is allergic to lactose intolerance (gi); amiodarone; augmentin [amoxicillin-pot clavulanate]; and milk-related compounds.  Allergies as of 09/26/2016      Reactions   Lactose Intolerance (gi) Other (See Comments)   Amiodarone Other (See Comments)   Corneal deposits   Augmentin [amoxicillin-pot Clavulanate] Diarrhea   Milk-related Compounds Other (See Comments)   GI upset, projectile vomiting - can't take any dairy products      Medication List       Accurate as of 09/26/16  6:13 PM. Always use your most recent med list.          albuterol 108 (90 Base) MCG/ACT inhaler Commonly known as:  PROVENTIL HFA;VENTOLIN HFA Inhale 1 puff into the lungs every 6 (six) hours as needed for wheezing or shortness of breath.   amLODipine 5 MG tablet Commonly known as:  NORVASC Take 1 tablet (5 mg total) by mouth daily.   aspirin EC 81 MG tablet Take 81 mg by mouth daily.   atorvastatin 20 MG tablet Commonly known as:  LIPITOR Take 20 mg by mouth every morning.   CALCIUM 600 + D PO Take 1 tablet by mouth daily.   chlorthalidone 25 MG tablet Commonly known as:  HYGROTON Take 1 tablet (25 mg total) by mouth daily.   diphenoxylate-atropine 2.5-0.025 MG tablet Commonly known as:  LOMOTIL Take 2 tablets by mouth 4 (four) times daily as needed for diarrhea or loose stools.   levothyroxine 150 MCG tablet Commonly known as:  SYNTHROID, LEVOTHROID Take 150 mcg by mouth as directed. Take 1 tablet once every Tuesday, Thursday, Saturday, and Sunday.   lidocaine-prilocaine cream Commonly known as:  EMLA Apply 1 application topically as needed.   loperamide 2 MG capsule Commonly known as:  IMODIUM Take 2 mg by mouth as needed for diarrhea  or loose stools.   meloxicam 7.5 MG tablet Commonly known as:  MOBIC Take 7.5 mg by mouth 2 (two) times daily as needed for pain.   methocarbamol 500 MG tablet Commonly known as:  ROBAXIN Take 500 mg by mouth every 8 (eight) hours as needed for muscle spasms.   metoprolol succinate 25 MG 24 hr tablet Commonly known as:  TOPROL-XL Take one-half tablet by  mouth daily   montelukast 10 MG tablet Commonly known as:  SINGULAIR Take 10 mg by mouth at bedtime.   MULTIVITAMIN/IRON PO Take 1 tablet by mouth daily.   ondansetron 8 MG tablet Commonly known as:  ZOFRAN Take 1 tablet (8 mg total) by mouth every 8 (eight) hours as needed for refractory nausea / vomiting (start on Day 3 after each treatment).   potassium chloride SA 20 MEQ tablet Commonly known as:  K-DUR,KLOR-CON Take 1 tablet (20 mEq total) by mouth daily.   prochlorperazine 10 MG tablet Commonly known as:  COMPAZINE Take 1 tablet (10 mg total) by mouth every 6 (six) hours as needed for nausea or  vomiting.   saccharomyces boulardii 250 MG capsule Commonly known as:  FLORASTOR Take 1 capsule (250 mg total) by mouth 2 (two) times daily.   vancomycin 50 mg/mL oral solution Commonly known as:  VANCOCIN Take 2.5 mLs (125 mg total) by mouth 4 (four) times daily.   Vitamin D3 1000 units Caps Take 1,000 Units by mouth daily.        PHYSICAL EXAMINATION  Oncology Vitals 09/26/2016 09/26/2016  Height - 149 cm  Weight - 72.893 kg  Weight (lbs) - 160 lbs 11 oz  BMI (kg/m2) - 33.01 kg/m2  Temp - 98.8  Pulse 97 104  Resp 18 18  SpO2 100 97  BSA (m2) - 1.73 m2   BP Readings from Last 2 Encounters:  09/26/16 131/66  09/22/16 126/77    Physical Exam  Constitutional: She is oriented to person, place, and time. Vital signs are normal. She appears malnourished and dehydrated. She appears unhealthy. She appears cachectic.  HENT:  Head: Normocephalic and atraumatic.  Mouth/Throat: Oropharynx is clear and moist.    Eyes: Conjunctivae and EOM are normal. Pupils are equal, round, and reactive to light. Right eye exhibits no discharge. Left eye exhibits no discharge. No scleral icterus.  Neck: Normal range of motion. Neck supple. No JVD present. No tracheal deviation present. No thyromegaly present.  Cardiovascular: Normal rate, regular rhythm, normal heart sounds and intact distal pulses.   Pulmonary/Chest: Effort normal and breath sounds normal. No respiratory distress. She has no wheezes. She has no rales. She exhibits no tenderness.  Abdominal: Soft. Bowel sounds are normal. She exhibits no distension and no mass. There is no tenderness. There is no rebound and no guarding.  Musculoskeletal: Normal range of motion. She exhibits no edema or tenderness.  Lymphadenopathy:    She has no cervical adenopathy.  Neurological: She is alert and oriented to person, place, and time. Gait normal.  Skin: Skin is warm and dry. No rash noted. No erythema. There is pallor.  Psychiatric: Affect normal.  Nursing note and vitals reviewed.   LABORATORY DATA:. Appointment on 09/26/2016  Component Date Value Ref Range Status  . Glucose 09/26/2016 Negative  Negative mg/dL Final  . Bilirubin (Urine) 09/26/2016 Negative  Negative Final  . Ketones 09/26/2016 Negative  Negative mg/dL Final  . Specific Gravity, Urine 09/26/2016 1.005  1.003 - 1.035 Final  . Blood 09/26/2016 Negative  Negative Final  . pH 09/26/2016 6.0  4.6 - 8.0 Final  . Protein 09/26/2016 Negative  Negative- <30 mg/dL Final  . Urobilinogen, UR 09/26/2016 0.2  0.2 - 1 mg/dL Final  . Nitrite 09/26/2016 Negative  Negative Final  . Leukocyte Esterase 09/26/2016 Negative  Negative Final  . RBC / HPF 09/26/2016 Negative  0 - 2 Final  . WBC, UA 09/26/2016 0-2  0-2;Negative Final  . Bacteria, UA 09/26/2016 Many  Negative- Trace Final  . Epithelial Cells 09/26/2016 Rare  Negative- Few Final  Appointment on 09/26/2016  Component Date Value Ref Range Status  .  WBC 09/26/2016 2.3* 3.9 - 10.3 10e3/uL Final  . NEUT# 09/26/2016 0.4* 1.5 - 6.5 10e3/uL Final  . HGB 09/26/2016 10.5* 11.6 - 15.9 g/dL Final  . HCT 09/26/2016 31.1* 34.8 - 46.6 % Final  . Platelets 09/26/2016 130* 145 - 400 10e3/uL Final  . MCV 09/26/2016 95.2  79.5 - 101.0 fL Final  . MCH 09/26/2016 32.1  25.1 - 34.0 pg Final  . MCHC 09/26/2016 33.7  31.5 - 36.0 g/dL Final  . RBC  09/26/2016 3.27* 3.70 - 5.45 10e6/uL Final  . RDW 09/26/2016 14.2  11.2 - 14.5 % Final  . lymph# 09/26/2016 0.9  0.9 - 3.3 10e3/uL Final  . MONO# 09/26/2016 0.8  0.1 - 0.9 10e3/uL Final  . Eosinophils Absolute 09/26/2016 0.1  0.0 - 0.5 10e3/uL Final  . Basophils Absolute 09/26/2016 0.0  0.0 - 0.1 10e3/uL Final  . NEUT% 09/26/2016 17.8* 38.4 - 76.8 % Final  . LYMPH% 09/26/2016 38.2  14.0 - 49.7 % Final  . MONO% 09/26/2016 36.6* 0.0 - 14.0 % Final  . EOS% 09/26/2016 6.2  0.0 - 7.0 % Final  . BASO% 09/26/2016 1.2  0.0 - 2.0 % Final  . Sodium 09/26/2016 135* 136 - 145 mEq/L Final  . Potassium 09/26/2016 3.0* 3.5 - 5.1 mEq/L Final  . Chloride 09/26/2016 93* 98 - 109 mEq/L Final  . CO2 09/26/2016 30* 22 - 29 mEq/L Final  . Glucose 09/26/2016 160* 70 - 140 mg/dl Final  . BUN 09/26/2016 26.9* 7.0 - 26.0 mg/dL Final  . Creatinine 09/26/2016 1.3* 0.6 - 1.1 mg/dL Final  . Total Bilirubin 09/26/2016 0.88  0.20 - 1.20 mg/dL Final  . Alkaline Phosphatase 09/26/2016 104  40 - 150 U/L Final  . AST 09/26/2016 17  5 - 34 U/L Final  . ALT 09/26/2016 28  0 - 55 U/L Final  . Total Protein 09/26/2016 6.6  6.4 - 8.3 g/dL Final  . Albumin 09/26/2016 3.6  3.5 - 5.0 g/dL Final  . Calcium 09/26/2016 10.3  8.4 - 10.4 mg/dL Final  . Anion Gap 09/26/2016 11  3 - 11 mEq/L Final  . EGFR 09/26/2016 40* >90 ml/min/1.73 m2 Final  Appointment on 09/25/2016  Component Date Value Ref Range Status  . WBC 09/25/2016 2.1* 3.9 - 10.3 10e3/uL Final  . NEUT# 09/25/2016 1.1* 1.5 - 6.5 10e3/uL Final  . HGB 09/25/2016 10.8* 11.6 - 15.9 g/dL Final    . HCT 09/25/2016 31.6* 34.8 - 46.6 % Final  . Platelets 09/25/2016 135* 145 - 400 10e3/uL Final  . MCV 09/25/2016 94.3  79.5 - 101.0 fL Final  . MCH 09/25/2016 32.2  25.1 - 34.0 pg Final  . MCHC 09/25/2016 34.2  31.5 - 36.0 g/dL Final  . RBC 09/25/2016 3.35* 3.70 - 5.45 10e6/uL Final  . RDW 09/25/2016 14.1  11.2 - 14.5 % Final  . lymph# 09/25/2016 0.7* 0.9 - 3.3 10e3/uL Final  . MONO# 09/25/2016 0.1  0.1 - 0.9 10e3/uL Final  . Eosinophils Absolute 09/25/2016 0.2  0.0 - 0.5 10e3/uL Final  . Basophils Absolute 09/25/2016 0.0  0.0 - 0.1 10e3/uL Final  . NEUT% 09/25/2016 52.4  38.4 - 76.8 % Final  . LYMPH% 09/25/2016 32.7  14.0 - 49.7 % Final  . MONO% 09/25/2016 5.3  0.0 - 14.0 % Final  . EOS% 09/25/2016 9.1* 0.0 - 7.0 % Final  . BASO% 09/25/2016 0.5  0.0 - 2.0 % Final  . Sodium 09/25/2016 137  136 - 145 mEq/L Final  . Potassium 09/25/2016 3.1* 3.5 - 5.1 mEq/L Final  . Chloride 09/25/2016 95* 98 - 109 mEq/L Final  . CO2 09/25/2016 32* 22 - 29 mEq/L Final  . Glucose 09/25/2016 114  70 - 140 mg/dl Final  . BUN 09/25/2016 26.6* 7.0 - 26.0 mg/dL Final  . Creatinine 09/25/2016 1.2* 0.6 - 1.1 mg/dL Final  . Total Bilirubin 09/25/2016 0.97  0.20 - 1.20 mg/dL Final  . Alkaline Phosphatase 09/25/2016 100  40 - 150 U/L Final  .  AST 09/25/2016 28  5 - 34 U/L Final  . ALT 09/25/2016 35  0 - 55 U/L Final  . Total Protein 09/25/2016 6.9  6.4 - 8.3 g/dL Final  . Albumin 09/25/2016 3.6  3.5 - 5.0 g/dL Final  . Calcium 09/25/2016 9.9  8.4 - 10.4 mg/dL Final  . Anion Gap 09/25/2016 11  3 - 11 mEq/L Final  . EGFR 09/25/2016 44* >90 ml/min/1.73 m2 Final    RADIOGRAPHIC STUDIES: No results found.  ASSESSMENT/PLAN:    Nausea with vomiting Patient states she has been experiencing nausea and occasional vomiting.  Since her last cycle of chemotherapy.  She has been taking both Compazine and Zofran with only moderate effectiveness.  She appears dehydrated and will receive IV fluid rehydration.  She  refused any further anti--nausea medication while cancer Center today.  Lung cancer, middle lobe [facility]) Patient received cycle 2 of her Taxotere/carboplatin chemotherapy regimen on 09/18/2016.  She has received Neulasta for growth factor support.  See further notes for details of today's visit.  Patient return for labs only on 10/02/2016.  She will return for labs, visit, and chemotherapy on 10/09/2016.  Hypokalemia Patient's potassium is down to 3.0 today.  Dr. Julien Nordmann has are called in potassium tablets for the patient; but she has not picked them up as of yet.  Dehydration Patient states she has been experiencing nausea and occasional vomiting.  Since her last cycle of chemotherapy.  She has been taking both Compazine and Zofran with only moderate effectiveness.  She appears dehydrated and will receive IV fluid rehydration.  She refused any further anti--nausea medication while cancer Center today.  Chemotherapy induced neutropenia ([place]) Patient received her last cycle of chemotherapy on 03/18/2017; and received Neulasta for growth factor support on 09/22/2016.  Blood counts obtained today reveal neutropenic at 0.4, ANC.  Patient states that she's had some chills at night; but no documented fevers.  Also of note-patient has been diagnosed with C. difficile recently; and just completed all of her antibiotics for treatment of C. difficile.  She states that all I re-has since resolved.  Urinalysis obtained today revealed no leukocytes or WBCs; but increased bacteria only.  Will await urine culture results prior to prescribing any antibiotics-since patient has a recent history of C. difficile.  Also, patient had blood cultures 2 drawn as well.  Patient was afebrile today and other vital signs were stable.  Reviewed all of these findings with Dr. Burr Medico; who agreed with this plan of care.   She was advised to call/return or go directly to the emergency department with any worsening symptoms  whatsoever.   Patient stated understanding of all instructions; and was in agreement with this plan of care. The patient knows to call the clinic with any problems, questions or concerns.   Total time spent with patient was 25 minutes;  with greater than 75 percent of that time spent in face to face counseling regarding patient's symptoms,  and coordination of care and follow up.  Disclaimer:This dictation was prepared with Dragon/digital dictation along with [employer]. Any transcriptional errors that result from this process are unintentional.  Drue Second, NP 09/26/2016

## 2016-09-26 NOTE — Assessment & Plan Note (Signed)
Patient received cycle 2 of her Taxotere/carboplatin chemotherapy regimen on 09/18/2016.  She has received Neulasta for growth factor support.  See further notes for details of today's visit.  Patient return for labs only on 10/02/2016.  She will return for labs, visit, and chemotherapy on 10/09/2016.

## 2016-09-26 NOTE — Patient Instructions (Addendum)
Neutropenia Introduction Neutropenia is a condition that occurs when you have a lower-than-normal level of a type of white blood cell (neutrophil) in your body. Neutrophils are made in the spongy center of large bones (bone marrow) and they fight infections. Neutrophils are your body's main defense against bacterial and fungal infections. The fewer neutrophils you have and the longer your body remains without them, the greater your risk of getting a severe infection. What are the causes? This condition can occur if your body uses up or destroys neutrophils faster than your bone marrow can make them. This problem may happen because of:  Bacterial or fungal infection.  Allergic disorders.  Reactions to some medicines.  Autoimmune disease.  An enlarged spleen. This condition can also occur if your bone marrow does not produce enough neutrophils. This problem may be caused by:  Cancer.  Cancer treatments, such as radiation or chemotherapy.  Viral infections.  Medicines, such as phenytoin.  Vitamin B12 deficiency.  Diseases of the bone marrow.  Environmental toxins, such as insecticides. What are the signs or symptoms? This condition does not usually cause symptoms. If symptoms are present, they are usually caused by an underlying infection. Symptoms of an infection may include:  Fever.  Chills.  Swollen glands.  Oral or anal ulcers.  Cough and shortness of breath.  Rash.  Skin infection.  Fatigue. How is this diagnosed? Your health care provider may suspect neutropenia if you have:  A condition that may cause neutropenia.  Symptoms of infection, especially fever.  Frequent and unusual infections. You will have a medical history and physical exam. Tests will also be done, such as:  A complete blood count (CBC).  A procedure to collect a sample of bone marrow for examination (bone marrow biopsy).  A chest X-ray.  A urine culture.  A blood culture. How is  this treated? Treatment depends on the underlying cause and severity of your condition. Mild neutropenia may not require treatment. Treatment may include medicines, such as:  Antibiotic medicine given through an IV tube.  Antiviral medicines.  Antifungal medicines.  A medicine to increase neutrophil production (colony-stimulating factor). You may get this drug through an IV tube or by injection.  Steroids given through an IV tube. If an underlying condition is causing neutropenia, you may need treatment for that condition. If medicines you are taking are causing neutropenia, your health care provider may have you stop taking those medicines. Follow these instructions at home: Medicines  Take over-the-counter and prescription medicines only as told by your health care provider.  Get a seasonal flu shot (influenza vaccine). Lifestyle  Do not eat unpasteurized foods.Do not eat unwashed raw fruits or vegetables.  Avoid exposure to groups of people or children.  Avoid being around people who are sick.  Avoid being around dirt or dust, such as in construction areas or gardens.  Do not provide direct care for pets. Avoid animal droppings. Do not clean litter boxes and bird cages. Hygiene   Bathe daily.  Clean the area between the genitals and the anus (perineal area) after you urinate or have a bowel movement. If you are female, wipe from front to back.  Brush your teeth with a soft toothbrush before and after meals.  Do not use a razor that has a blade. Use an electric razor to remove hair.  Wash your hands often. Make sure others who come in contact with you also wash their hands. If soap and water are not available, use hand   sanitizer. General instructions  Do not have sex unless your health care provider has approved.  Take actions to avoid cuts and burns. For example:  Be cautious when you use knives. Always cut away from yourself.  Keep knives in protective sheaths or  guards when not in use.  Use oven mitts when you cook with a hot stove, oven, or grill.  Stand a safe distance away from open fires.  Avoid people who received a vaccine in the past 30 days if that vaccine contained a live version of the germ (live vaccine). You should not get a live vaccine. Common live vaccines are varicella, measles, mumps, and rubella.  Do not share food utensils.  Do not use tampons, enemas, or rectal suppositories unless your health care provider has approved.  Keep all appointments as told by your health care provider. This is important. Contact a health care provider if:  You have a fever.  You have chills or you start to shake.  You have:  A sore throat.  A warm, red, or tender area on your skin.  A cough.  Frequent or painful urination.  Vaginal discharge or itching.  You develop:  Sores in your mouth or anus.  Swollen lymph nodes.  Red streaks on the skin.  A rash.  You feel:  Nauseous or you vomit.  Very fatigued.  Short of breath. This information is not intended to replace advice given to you by your health care provider. Make sure you discuss any questions you have with your health care provider. Document Released: 02/10/2002 Document Revised: 01/27/2016 Document Reviewed: 03/03/2015  2017 Elsevier    Hypokalemia Hypokalemia means that the amount of potassium in the blood is lower than normal.Potassium is a chemical that helps regulate the amount of fluid in the body (electrolyte). It also stimulates muscle tightening (contraction) and helps nerves work properly.Normally, most of the body's potassium is inside of cells, and only a very small amount is in the blood. Because the amount in the blood is so small, minor changes to potassium levels in the blood can be life-threatening. What are the causes? This condition may be caused by:  Antibiotic medicine.  Diarrhea or vomiting. Taking too much of a medicine that helps you  have a bowel movement (laxative) can cause diarrhea and lead to hypokalemia.  Chronic kidney disease (CKD).  Medicines that help the body get rid of excess fluid (diuretics).  Eating disorders, such as bulimia.  Low magnesium levels in the body.  Sweating a lot. What are the signs or symptoms? Symptoms of this condition include:  Weakness.  Constipation.  Fatigue.  Muscle cramps.  Mental confusion.  Skipped heartbeats or irregular heartbeat (palpitations).  Tingling or numbness. How is this diagnosed? This condition is diagnosed with a blood test. How is this treated? Hypokalemia can be treated by taking potassium supplements by mouth or adjusting the medicines that you take. Treatment may also include eating more foods that contain a lot of potassium. If your potassium level is very low, you may need to get potassium through an IV tube in one of your veins and be monitored in the hospital. Follow these instructions at home:  Take over-the-counter and prescription medicines only as told by your health care provider. This includes vitamins and supplements.  Eat a healthy diet. A healthy diet includes fresh fruits and vegetables, whole grains, healthy fats, and lean proteins.  If instructed, eat more foods that contain a lot of potassium, such as:  Nuts, such as peanuts and pistachios.  Seeds, such as sunflower seeds and pumpkin seeds.  Peas, lentils, and lima beans.  Whole grain and bran cereals and breads.  Fresh fruits and vegetables, such as apricots, avocado, bananas, cantaloupe, kiwi, oranges, tomatoes, asparagus, and potatoes.  Orange juice.  Tomato juice.  Red meats.  Yogurt.  Keep all follow-up visits as told by your health care provider. This is important. Contact a health care provider if:  You have weakness that gets worse.  You feel your heart pounding or racing.  You vomit.  You have diarrhea.  You have diabetes (diabetes mellitus) and  you have trouble keeping your blood sugar (glucose) in your target range. Get help right away if:  You have chest pain.  You have shortness of breath.  You have vomiting or diarrhea that lasts for more than 2 days.  You faint. This information is not intended to replace advice given to you by your health care provider. Make sure you discuss any questions you have with your health care provider. Document Released: 08/21/2005 Document Revised: 04/08/2016 Document Reviewed: 04/08/2016 Elsevier Interactive Patient Education  2017 [employer]    Dehydration, Adult Dehydration is a condition in which there is not enough fluid or water in the body. This happens when you lose more fluids than you take in. Important organs, such as the kidneys, brain, and heart, cannot function without a proper amount of fluids. Any loss of fluids from the body can lead to dehydration. Dehydration can range from mild to severe. This condition should be treated right away to prevent it from becoming severe. What are the causes? This condition may be caused by:  Vomiting.  Diarrhea.  Excessive sweating, such as from heat exposure or exercise.  Not drinking enough fluid, especially:  When ill.  While doing activity that requires a lot of energy.  Excessive urination.  Fever.  Infection.  Certain medicines, such as medicines that cause the body to lose excess fluid (diuretics).  Inability to access safe drinking water.  Reduced physical ability to get adequate water and food. What increases the risk? This condition is more likely to develop in people:  Who have a poorly controlled long-term (chronic) illness, such as diabetes, heart disease, or kidney disease.  Who are age 72 or older.  Who are disabled.  Who live in a place with high altitude.  Who play endurance sports. What are the signs or symptoms? Symptoms of mild dehydration may include:  Thirst.  Dry lips.  Slightly dry  mouth.  Dry, warm skin.  Dizziness. Symptoms of moderate dehydration may include:  Very dry mouth.  Muscle cramps.  Dark urine. Urine may be the color of tea.  Decreased urine production.  Decreased tear production.  Heartbeat that is irregular or faster than normal (palpitations).  Headache.  Light-headedness, especially when you stand up from a sitting position.  Fainting (syncope). Symptoms of severe dehydration may include:  Changes in skin, such as:  Cold and clammy skin.  Blotchy (mottled) or pale skin.  Skin that does not quickly return to normal after being lightly pinched and released (poor skin turgor).  Changes in body fluids, such as:  Extreme thirst.  No tear production.  Inability to sweat when body temperature is high, such as in hot weather.  Very little urine production.  Changes in vital signs, such as:  Weak pulse.  Pulse that is more than 100 beats a minute when sitting still.  Rapid breathing.  Low blood pressure.  Other changes, such as:  Sunken eyes.  Cold hands and feet.  Confusion.  Lack of energy (lethargy).  Difficulty waking up from sleep.  Short-term weight loss.  Unconsciousness. How is this diagnosed? This condition is diagnosed based on your symptoms and a physical exam. Blood and urine tests may be done to help confirm the diagnosis. How is this treated? Treatment for this condition depends on the severity. Mild or moderate dehydration can often be treated at home. Treatment should be started right away. Do not wait until dehydration becomes severe. Severe dehydration is an emergency and it needs to be treated in a hospital. Treatment for mild dehydration may include:  Drinking more fluids.  Replacing salts and minerals in your blood (electrolytes) that you may have lost. Treatment for moderate dehydration may include:  Drinking an oral rehydration solution (ORS). This is a drink that helps you replace  fluids and electrolytes (rehydrate). It can be found at pharmacies and retail stores. Treatment for severe dehydration may include:  Receiving fluids through an IV tube.  Receiving an electrolyte solution through a feeding tube that is passed through your nose and into your stomach (nasogastric tube, or NG tube).  Correcting any abnormalities in electrolytes.  Treating the underlying cause of dehydration. Follow these instructions at home:  If directed by your health care provider, drink an ORS:  Make an ORS by following instructions on the package.  Start by drinking small amounts, about  cup (120 mL) every 5-10 minutes.  Slowly increase how much you drink until you have taken the amount recommended by your health care provider.  Drink enough clear fluid to keep your urine clear or pale yellow. If you were told to drink an ORS, finish the ORS first, then start slowly drinking other clear fluids. Drink fluids such as:  Water. Do not drink only water. Doing that can lead to having too little salt (sodium) in the body (hyponatremia).  Ice chips.  Fruit juice that you have added water to (diluted fruit juice).  Low-calorie sports drinks.  Avoid:  Alcohol.  Drinks that contain a lot of sugar. These include high-calorie sports drinks, fruit juice that is not diluted, and soda.  Caffeine.  Foods that are greasy or contain a lot of fat or sugar.  Take over-the-counter and prescription medicines only as told by your health care provider.  Do not take sodium tablets. This can lead to having too much sodium in the body (hypernatremia).  Eat foods that contain a healthy balance of electrolytes, such as bananas, oranges, potatoes, tomatoes, and spinach.  Keep all follow-up visits as told by your health care provider. This is important. Contact a health care provider if:  You have abdominal pain that:  Gets worse.  Stays in one area (localizes).  You have a rash.  You  have a stiff neck.  You are more irritable than usual.  You are sleepier or more difficult to wake up than usual.  You feel weak or dizzy.  You feel very thirsty.  You have urinated only a small amount of very dark urine over 6-8 hours. Get help right away if:  You have symptoms of severe dehydration.  You cannot drink fluids without vomiting.  Your symptoms get worse with treatment.  You have a fever.  You have a severe headache.  You have vomiting or diarrhea that:  Gets worse.  Does not go away.  You have blood or green matter (bile) in  your vomit.  You have blood in your stool. This may cause stool to look black and tarry.  You have not urinated in 6-8 hours.  You faint.  Your heart rate while sitting still is over 100 beats a minute.  You have trouble breathing. This information is not intended to replace advice given to you by your health care provider. Make sure you discuss any questions you have with your health care provider. Document Released: 08/21/2005 Document Revised: 03/17/2016 Document Reviewed: 10/15/2015 Elsevier Interactive Patient Education  2017 [employer].

## 2016-09-26 NOTE — Telephone Encounter (Signed)
-----   Message from Ardeen Garland, RN sent at 09/26/2016  9:50 AM EST -----   ----- Message ----- From: Curt Bears, MD Sent: 09/25/2016   8:44 PM To: Ardeen Garland, RN  K is low. Please order K Dur 20 meq X 7 days. ----- Message ----- From: Interface, Lab In [facility] One Sent: 09/25/2016  11:48 AM To: Curt Bears, MD

## 2016-09-26 NOTE — Telephone Encounter (Signed)
Pt threw up bile and fluid about 2x/day, quickly comes right up and right out. About 1/2-3/4 cup at a time. She was able to drink about 30 oz of fluid yesterday. She does feel very weak, getting dizzy when she gets up. This is yesterday and today. She is requesting an infusion of fluids. It will take her about 1 hr to get to [facility]. Pt stated she is using about 2 compazine and 2 zofran each day and does not feel they are helping.   S/w Dr Julien Nordmann and had pt come in to [facility].

## 2016-09-26 NOTE — Assessment & Plan Note (Signed)
Patient states she has been experiencing nausea and occasional vomiting.  Since her last cycle of chemotherapy.  She has been taking both Compazine and Zofran with only moderate effectiveness.  She appears dehydrated and will receive IV fluid rehydration.  She refused any further anti--nausea medication while cancer Center today.

## 2016-09-26 NOTE — Telephone Encounter (Signed)
Rx for K-Dur sent to pt pharmacy. Notified pt K+ is low and to pick up rx today.

## 2016-09-26 NOTE — Assessment & Plan Note (Signed)
Patient received her last cycle of chemotherapy on 03/18/2017; and received Neulasta for growth factor support on 09/22/2016.  Blood counts obtained today reveal neutropenic at 0.4, ANC.  Patient states that she's had some chills at night; but no documented fevers.  Also of note-patient has been diagnosed with C. difficile recently; and just completed all of her antibiotics for treatment of C. difficile.  She states that all I re-has since resolved.  Urinalysis obtained today revealed no leukocytes or WBCs; but increased bacteria only.  Will await urine culture results prior to prescribing any antibiotics-since patient has a recent history of C. difficile.  Also, patient had blood cultures 2 drawn as well.  Patient was afebrile today and other vital signs were stable.  Reviewed all of these findings with Dr. Burr Medico; who agreed with this plan of care.   She was advised to call/return or go directly to the emergency department with any worsening symptoms whatsoever.

## 2016-09-27 ENCOUNTER — Ambulatory Visit: Payer: Medicare Other

## 2016-09-28 ENCOUNTER — Other Ambulatory Visit: Payer: Self-pay | Admitting: *Deleted

## 2016-09-28 ENCOUNTER — Telehealth: Payer: Self-pay | Admitting: *Deleted

## 2016-09-28 DIAGNOSIS — N390 Urinary tract infection, site not specified: Secondary | ICD-10-CM

## 2016-09-28 LAB — URINE CULTURE

## 2016-09-28 MED ORDER — CIPROFLOXACIN HCL 500 MG PO TABS: 500.0000 mg | ORAL_TABLET | Freq: Two times a day (BID) | ORAL | 0 refills | Status: AC

## 2016-09-28 NOTE — Telephone Encounter (Signed)
[name]. Spoke with her and advised her that she does have UTI-culture report back and positive for E. Coli.  Advised her that Cipro 500 mg BID has been called in to her [facility] pharmacy.  Pt voiced understanding.. Advised pt to call back if she experiences any problems with the cipro/ diarrhea, n/v.  Pt states she feels better overall.

## 2016-10-02 ENCOUNTER — Telehealth: Payer: Self-pay

## 2016-10-02 ENCOUNTER — Ambulatory Visit (HOSPITAL_BASED_OUTPATIENT_CLINIC_OR_DEPARTMENT_OTHER): Payer: Medicare Other | Admitting: Nurse Practitioner

## 2016-10-02 ENCOUNTER — Other Ambulatory Visit (HOSPITAL_BASED_OUTPATIENT_CLINIC_OR_DEPARTMENT_OTHER): Payer: Medicare Other

## 2016-10-02 ENCOUNTER — Encounter: Payer: Self-pay | Admitting: Nurse Practitioner

## 2016-10-02 ENCOUNTER — Other Ambulatory Visit: Payer: Medicare Other

## 2016-10-02 VITALS — BP 132/59 | HR 89 | Temp 98.7°F | Resp 18 | Ht <= 58 in | Wt 162.1 lb

## 2016-10-02 DIAGNOSIS — C342 Malignant neoplasm of middle lobe, bronchus or lung: Secondary | ICD-10-CM

## 2016-10-02 DIAGNOSIS — R21 Rash and other nonspecific skin eruption: Secondary | ICD-10-CM | POA: Diagnosis not present

## 2016-10-02 DIAGNOSIS — W57XXXA Bitten or stung by nonvenomous insect and other nonvenomous arthropods, initial encounter: Secondary | ICD-10-CM | POA: Insufficient documentation

## 2016-10-02 LAB — COMPREHENSIVE METABOLIC PANEL
ALBUMIN: 3.5 g/dL (ref 3.5–5.0)
ALK PHOS: 149 U/L (ref 40–150)
ALT: 17 U/L (ref 0–55)
AST: 16 U/L (ref 5–34)
Anion Gap: 10 mEq/L (ref 3–11)
BUN: 15.2 mg/dL (ref 7.0–26.0)
CALCIUM: 8.5 mg/dL (ref 8.4–10.4)
CO2: 29 mEq/L (ref 22–29)
Chloride: 100 mEq/L (ref 98–109)
Creatinine: 0.9 mg/dL (ref 0.6–1.1)
EGFR: 62 mL/min/{1.73_m2} — AB (ref >90)
Glucose: 122 mg/dl (ref 70–140)
POTASSIUM: 3.6 meq/L (ref 3.5–5.1)
Sodium: 139 mEq/L (ref 136–145)
Total Bilirubin: 0.32 mg/dL (ref 0.20–1.20)
Total Protein: 6.6 g/dL (ref 6.4–8.3)

## 2016-10-02 LAB — CULTURE, BLOOD (SINGLE)

## 2016-10-02 LAB — CBC WITH DIFFERENTIAL/PLATELET
BASO%: 0.2 % (ref 0.0–2.0)
BASOS ABS: 0 10*3/uL (ref 0.0–0.1)
EOS ABS: 0.2 10*3/uL (ref 0.0–0.5)
EOS%: 1.1 % (ref 0.0–7.0)
HEMATOCRIT: 29.4 % — AB (ref 34.8–46.6)
HEMOGLOBIN: 9.7 g/dL — AB (ref 11.6–15.9)
LYMPH#: 1.5 10*3/uL (ref 0.9–3.3)
LYMPH%: 8.3 % — ABNORMAL LOW (ref 14.0–49.7)
MCH: 31.8 pg (ref 25.1–34.0)
MCHC: 33 g/dL (ref 31.5–36.0)
MCV: 96.4 fL (ref 79.5–101.0)
MONO#: 1.1 10*3/uL — ABNORMAL HIGH (ref 0.1–0.9)
MONO%: 6.1 % (ref 0.0–14.0)
NEUT#: 14.9 10*3/uL — ABNORMAL HIGH (ref 1.5–6.5)
NEUT%: 84.3 % — ABNORMAL HIGH (ref 38.4–76.8)
Platelets: 120 10*3/uL — ABNORMAL LOW (ref 145–400)
RBC: 3.05 10*6/uL — ABNORMAL LOW (ref 3.70–5.45)
RDW: 15 % — AB (ref 11.2–14.5)
WBC: 17.7 10*3/uL — ABNORMAL HIGH (ref 3.9–10.3)

## 2016-10-02 MED ORDER — METHYLPREDNISOLONE 4 MG PO TBPK: ORAL_TABLET | ORAL | 0 refills | Status: DC

## 2016-10-02 NOTE — Assessment & Plan Note (Signed)
Patient received cycle 2 of her Taxol/carboplatin chemotherapy regimen on 09/18/2016.  She also received Neulasta for growth factor support.  She is scheduled to return for labs, visit, and her next cycle of chemotherapy on 10/09/2016.

## 2016-10-02 NOTE — Assessment & Plan Note (Signed)
Patient states that she noticed 2 firm lesions to her left forearm when she woke this morning.  By midmorning.  There was a third lesion noted to her forearm near her wrist as well.  The areas have a firm lump in the middle; with some surrounding erythema.  There is no pruritus to the site.  She also denies any other new symptoms.  She denies any recent fevers or chills.  On exam today.  It appears that these 3 areas are actually insect bites of some type.  Advised patient it could be a spider bite but very difficult to tell for sure.  Patient was advised to elevate her arm whenever she is at rest.  She was also given both verbal and written instructions regarding the use of both Benadryl and Pepcid.  Dr. Julien Nordmann in to examine the patient as well; and he was in agreement that this is most likely insect bites.  He advised patient be prescribed a Medrol Dosepak as well.  Of note-patient just completed the Cipro antibiotics just yesterday for treatment of her previous UTI.

## 2016-10-02 NOTE — Telephone Encounter (Signed)
Pt called stating she woke up this morning with 2 big lumps on her upper forearm, they are red and feel like a bruise. She says she can feel bumps under the skin. She recalls no trauma to her forearm. She has labs this morning at 11. She had carbo/taxol on 1/15, she had fluids and +UTI with antibiotics on 1/23. S/w Dr Julien Nordmann and he said for her to see Cyndee. Called her back on cell and LVM to see Cyndee, did explain that Cyndee is out until 1130 or 12.  Inbasket sent

## 2016-10-02 NOTE — Progress Notes (Signed)
SYMPTOM MANAGEMENT CLINIC   Chief Complaint: Insect bites  HPI:  Kimberly [name] 76 y.o. female diagnosed with lung cancer.  Currently undergoing Taxol/carboplatin chemotherapy regimen.    No history exists.    Review of Systems  Skin: Positive for rash.  All other systems reviewed and are negative.   Past Medical History:  Diagnosis Date  . Allergic asthma without acute exacerbation or status asthmaticus   . Aortic insufficiency   . Arthritis   . Atrial fibrillation ([place])   . Back pain 08/14/2016  . Bronchitis   . Cough    dry, endobronchial mass  . Dyslipidemia   . Family history of adverse reaction to anesthesia    pts son also experiences N/V  . GERD (gastroesophageal reflux disease)   . Heart murmur   . History of blood transfusion   . History of bronchitis   . History of chemotherapy   . History of nuclear stress test 02/26/2006   exercise myoview; normal pattern of perfusion; low risk scan   . Hypertension   . Hypothyroidism   . Mitral insufficiency   . Pneumonia   . PONV (postoperative nausea and vomiting)   . Radiation 12/01/13-01/08/14   50.4 gray to right central chest  . Shortness of breath    with exertion  . 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 Syncope    "states she has passed out a few times. dr is trying to find cause.last time when geeting ready to go home after video bronch/bx  . Thyroid disease     Past Surgical History:  Procedure Laterality Date  . CARDIAC CATHETERIZATION  07/08/2008   normal coronaries  . CARPAL TUNNEL RELEASE Right 1988  . COLONOSCOPY N/A 02/11/2016   Procedure: COLONOSCOPY;  Surgeon: Carol Ada, MD;  Location: [facility];  Service: Endoscopy;  Laterality: N/A;  . DILATION AND CURETTAGE OF UTERUS    . EYE SURGERY Bilateral   . H/O MET Test w/PFT  04/02/2012   low risk; peak VO2 77% predicted  . IR GENERIC HISTORICAL  09/12/2016   IR FLUORO GUIDE PORT INSERTION RIGHT 09/12/2016 [name], MD [facility]    . IR GENERIC HISTORICAL  09/12/2016   IR US GUIDE VASC ACCESS RIGHT 09/12/2016 [name], MD [facility]  . JOINT REPLACEMENT  2003   thumb rt  . KNEE ARTHROSCOPY  12   rt meniscus  . MEDIASTINOSCOPY N/A 10/30/2013   Procedure: MEDIASTINOSCOPY;  Surgeon: Melrose Nakayama, MD;  Location: [facility];  Service: Thoracic;  Laterality: N/A;  . ROTATOR CUFF REPAIR  2006   ? side  . THYROIDECTOMY  1973  . TRANSTHORACIC ECHOCARDIOGRAM  09/25/2012   EF 55-60%; mild LVH & mild concentric hypertrophy; mild AV regurg; RV systolic pressure increase consistent with mild pulm HTN  . VIDEO ASSISTED THORACOSCOPY (VATS)/ LOBECTOMY Right 11/13/2013   Procedure: VIDEO ASSISTED THORACOSCOPY (VATS) with mediastinal  biopsies;  Surgeon: Melrose Nakayama, MD;  Location: [facility];  Service: Thoracic;  Laterality: Right;  RIGHT VATS,mediastinal biopsies  . VIDEO BRONCHOSCOPY Bilateral 09/25/2013   Procedure: VIDEO BRONCHOSCOPY WITHOUT FLUORO;  Surgeon: Tanda Rockers, MD;  Location: [facility];  Service: Cardiopulmonary;  Laterality: Bilateral;  . VIDEO BRONCHOSCOPY WITH ENDOBRONCHIAL ULTRASOUND N/A 10/30/2013   Procedure: VIDEO BRONCHOSCOPY WITH ENDOBRONCHIAL ULTRASOUND;  Surgeon: Melrose Nakayama, MD;  Location: [facility];  Service: Thoracic;  Laterality: N/A;    has Hyperlipidemia; Essential hypertension; ALLERGIC RHINITIS; Asthma; SHORTNESS OF BREATH (SOB);  Syncope; Amaurosis fugax; Hemoptysis; Collapse of right lung: RML; Bronchial obstruction; Leukocytosis; Obstructive pneumonia; Lung cancer, middle lobe ([place]); Colitis; Bloody diarrhea; Atrial fibrillation with rapid ventricular response ([place]); Pleuritic chest pain; Paroxysmal atrial fibrillation ([place]); Diarrhea; Hyponatremia; Anemia; Symptomatic anemia; Other fatigue; Patient unable to exercise; Encounter for antineoplastic chemotherapy; Back pain; Poor venous access; Dehydration; C. difficile colitis; Hypokalemia; Malignant neoplasm of right lung ([place]);  Nausea with vomiting; Hypomagnesemia; Community acquired pneumonia of right upper lobe of lung ([place]); and Chemotherapy induced neutropenia ([place]) on her problem list.    is allergic to lactose intolerance (gi); amiodarone; augmentin [amoxicillin-pot clavulanate]; and milk-related compounds.  Allergies as of 10/02/2016      Reactions   Lactose Intolerance (gi) Other (See Comments)   Amiodarone Other (See Comments)   Corneal deposits   Augmentin [amoxicillin-pot Clavulanate] Diarrhea   Milk-related Compounds Other (See Comments)   GI upset, projectile vomiting - can't take any dairy products      Medication List       Accurate as of 10/02/16  1:06 PM. Always use your most recent med list.          albuterol 108 (90 Base) MCG/ACT inhaler Commonly known as:  PROVENTIL HFA;VENTOLIN HFA Inhale 1 puff into the lungs every 6 (six) hours as needed for wheezing or shortness of breath.   amLODipine 5 MG tablet Commonly known as:  NORVASC Take 1 tablet (5 mg total) by mouth daily.   aspirin EC 81 MG tablet Take 81 mg by mouth daily.   atorvastatin 20 MG tablet Commonly known as:  LIPITOR Take 20 mg by mouth every morning.   CALCIUM 600 + D PO Take 1 tablet by mouth daily.   chlorthalidone 25 MG tablet Commonly known as:  HYGROTON Take 1 tablet (25 mg total) by mouth daily.   ciprofloxacin 500 MG tablet Commonly known as:  CIPRO Take 1 tablet (500 mg total) by mouth 2 (two) times daily.   diphenoxylate-atropine 2.5-0.025 MG tablet Commonly known as:  LOMOTIL Take 2 tablets by mouth 4 (four) times daily as needed for diarrhea or loose stools.   levothyroxine 150 MCG tablet Commonly known as:  SYNTHROID, LEVOTHROID Take 150 mcg by mouth as directed. Take 1 tablet once every Tuesday, Thursday, Saturday, and Sunday.   lidocaine-prilocaine cream Commonly known as:  EMLA Apply 1 application topically as needed.   loperamide 2 MG capsule Commonly known as:  IMODIUM Take 2 mg by  mouth as needed for diarrhea or loose stools.   meloxicam 7.5 MG tablet Commonly known as:  MOBIC Take 7.5 mg by mouth 2 (two) times daily as needed for pain.   methocarbamol 500 MG tablet Commonly known as:  ROBAXIN Take 500 mg by mouth every 8 (eight) hours as needed for muscle spasms.   metoprolol succinate 25 MG 24 hr tablet Commonly known as:  TOPROL-XL Take one-half tablet by  mouth daily   montelukast 10 MG tablet Commonly known as:  SINGULAIR Take 10 mg by mouth at bedtime.   MULTIVITAMIN/IRON PO Take 1 tablet by mouth daily.   ondansetron 8 MG tablet Commonly known as:  ZOFRAN Take 1 tablet (8 mg total) by mouth every 8 (eight) hours as needed for refractory nausea / vomiting (start on Day 3 after each treatment).   potassium chloride SA 20 MEQ tablet Commonly known as:  K-DUR,KLOR-CON Take 1 tablet (20 mEq total) by mouth daily.   prochlorperazine 10 MG tablet Commonly known as:  COMPAZINE Take 1  tablet (10 mg total) by mouth every 6 (six) hours as needed for nausea or vomiting.   saccharomyces boulardii 250 MG capsule Commonly known as:  FLORASTOR Take 1 capsule (250 mg total) by mouth 2 (two) times daily.   vancomycin 50 mg/mL oral solution Commonly known as:  VANCOCIN Take 2.5 mLs (125 mg total) by mouth 4 (four) times daily.   Vitamin D3 1000 units Caps Take 1,000 Units by mouth daily.        PHYSICAL EXAMINATION  Oncology Vitals 10/02/2016 09/26/2016  Height 147 cm -  Weight 73.528 kg -  Weight (lbs) 162 lbs 2 oz -  BMI (kg/m2) 33.88 kg/m2 -  Temp 98.7 -  Pulse 89 97  Resp 18 18  SpO2 100 100  BSA (m2) 1.73 m2 -   BP Readings from Last 2 Encounters:  10/02/16 (!) 132/59  09/26/16 131/66    Physical Exam  Constitutional: She is oriented to person, place, and time and well-developed, well-nourished, and in no distress.  HENT:  Head: Normocephalic and atraumatic.  Eyes: Conjunctivae and EOM are normal. Pupils are equal, round, and  reactive to light.  Neck: Normal range of motion.  Pulmonary/Chest: Effort normal. No respiratory distress.  Musculoskeletal: Normal range of motion. She exhibits edema. She exhibits no tenderness.  Neurological: She is alert and oriented to person, place, and time. Gait normal.  Skin: Skin is warm and dry. Rash noted. There is erythema.  3.  Lesions to the left forearm with a firm center and surrounding erythema.  Psychiatric: Affect normal.  Nursing note and vitals reviewed.   LABORATORY DATA:. Appointment on 10/02/2016  Component Date Value Ref Range Status  . WBC 10/02/2016 17.7* 3.9 - 10.3 10e3/uL Final  . NEUT# 10/02/2016 14.9* 1.5 - 6.5 10e3/uL Final  . HGB 10/02/2016 9.7* 11.6 - 15.9 g/dL Final  . HCT 10/02/2016 29.4* 34.8 - 46.6 % Final  . Platelets 10/02/2016 120* 145 - 400 10e3/uL Final  . MCV 10/02/2016 96.4  79.5 - 101.0 fL Final  . MCH 10/02/2016 31.8  25.1 - 34.0 pg Final  . MCHC 10/02/2016 33.0  31.5 - 36.0 g/dL Final  . RBC 10/02/2016 3.05* 3.70 - 5.45 10e6/uL Final  . RDW 10/02/2016 15.0* 11.2 - 14.5 % Final  . lymph# 10/02/2016 1.5  0.9 - 3.3 10e3/uL Final  . MONO# 10/02/2016 1.1* 0.1 - 0.9 10e3/uL Final  . Eosinophils Absolute 10/02/2016 0.2  0.0 - 0.5 10e3/uL Final  . Basophils Absolute 10/02/2016 0.0  0.0 - 0.1 10e3/uL Final  . NEUT% 10/02/2016 84.3* 38.4 - 76.8 % Final  . LYMPH% 10/02/2016 8.3* 14.0 - 49.7 % Final  . MONO% 10/02/2016 6.1  0.0 - 14.0 % Final  . EOS% 10/02/2016 1.1  0.0 - 7.0 % Final  . BASO% 10/02/2016 0.2  0.0 - 2.0 % Final  . Sodium 10/02/2016 139  136 - 145 mEq/L Final  . Potassium 10/02/2016 3.6  3.5 - 5.1 mEq/L Final  . Chloride 10/02/2016 100  98 - 109 mEq/L Final  . CO2 10/02/2016 29  22 - 29 mEq/L Final  . Glucose 10/02/2016 122  70 - 140 mg/dl Final  . BUN 10/02/2016 15.2  7.0 - 26.0 mg/dL Final  . Creatinine 10/02/2016 0.9  0.6 - 1.1 mg/dL Final  . Total Bilirubin 10/02/2016 0.32  0.20 - 1.20 mg/dL Final  . Alkaline Phosphatase  10/02/2016 149  40 - 150 U/L Final  . AST 10/02/2016 16  5 - 34  U/L Final  . ALT 10/02/2016 17  0 - 55 U/L Final  . Total Protein 10/02/2016 6.6  6.4 - 8.3 g/dL Final  . Albumin 10/02/2016 3.5  3.5 - 5.0 g/dL Final  . Calcium 10/02/2016 8.5  8.4 - 10.4 mg/dL Final  . Anion Gap 10/02/2016 10  3 - 11 mEq/L Final  . EGFR 10/02/2016 62* >90 ml/min/1.73 m2 Final          RADIOGRAPHIC STUDIES: No results found.  ASSESSMENT/PLAN:    No problem-specific Assessment & Plan notes found for this encounter.   Patient stated understanding of all instructions; and was in agreement with this plan of care. The patient knows to call the clinic with any problems, questions or concerns.   Total time spent with patient was 25 minutes;  with greater than 75 percent of that time spent in face to face counseling regarding patient's symptoms,  and coordination of care and follow up.  Disclaimer:This dictation was prepared with Dragon/digital dictation along with [employer]. Any transcriptional errors that result from this process are unintentional.  Drue Second, NP 10/02/2016   ADDENDUM: Hematology/Oncology Attending: I had a face to face encounter with the patient today. I recommended her care plan. This is a very pleasant 76 years old white female with recurrent non-small cell lung cancer, squamous cell carcinoma. She is currently undergoing treatment with systemic chemotherapy with carboplatin and paclitaxel with Neulasta support status post 2 cycles. She is tolerating her treatment well. The patient woke up this morning with multiple large areas of macular skin rash on her left upper extremity. On exam it is suspicious to be secondary to insect bites. There is also possibility that this could represent acute neutrophilic dermatitis secondary to her treatment with Neulasta and significant increase in neutrophils on the recent blood work. We will start the patient on treatment with  Solu-Medrol, Benadryl and Pepcid. We'll may consider the patient for treatment with antibiotic if there is any worsening or signs of infection. She would come back for follow-up visit next week for evaluation before starting the next cycle of her chemotherapy. The patient was advised to call immediately if she has any concerning symptoms in the interval.  Disclaimer: This note was dictated with voice recognition software. Similar sounding words can inadvertently be transcribed and may be missed upon review. Eilleen Kempf., MD 10/02/16

## 2016-10-05 ENCOUNTER — Other Ambulatory Visit: Payer: Self-pay | Admitting: Internal Medicine

## 2016-10-09 ENCOUNTER — Other Ambulatory Visit (HOSPITAL_BASED_OUTPATIENT_CLINIC_OR_DEPARTMENT_OTHER): Payer: Medicare Other

## 2016-10-09 ENCOUNTER — Encounter: Payer: Self-pay | Admitting: Internal Medicine

## 2016-10-09 ENCOUNTER — Other Ambulatory Visit: Payer: Medicare Other

## 2016-10-09 ENCOUNTER — Ambulatory Visit (HOSPITAL_BASED_OUTPATIENT_CLINIC_OR_DEPARTMENT_OTHER): Payer: Medicare Other

## 2016-10-09 ENCOUNTER — Telehealth: Payer: Self-pay | Admitting: Internal Medicine

## 2016-10-09 ENCOUNTER — Ambulatory Visit (HOSPITAL_BASED_OUTPATIENT_CLINIC_OR_DEPARTMENT_OTHER): Payer: Medicare Other | Admitting: Internal Medicine

## 2016-10-09 VITALS — BP 134/66 | HR 98 | Temp 98.7°F | Resp 18 | Ht <= 58 in | Wt 160.9 lb

## 2016-10-09 DIAGNOSIS — Z5111 Encounter for antineoplastic chemotherapy: Secondary | ICD-10-CM

## 2016-10-09 DIAGNOSIS — C342 Malignant neoplasm of middle lobe, bronchus or lung: Secondary | ICD-10-CM

## 2016-10-09 DIAGNOSIS — R5383 Other fatigue: Secondary | ICD-10-CM | POA: Diagnosis not present

## 2016-10-09 DIAGNOSIS — I10 Essential (primary) hypertension: Secondary | ICD-10-CM

## 2016-10-09 DIAGNOSIS — G620 Drug-induced polyneuropathy: Secondary | ICD-10-CM | POA: Diagnosis not present

## 2016-10-09 LAB — CBC WITH DIFFERENTIAL/PLATELET
BASO%: 0.2 % (ref 0.0–2.0)
Basophils Absolute: 0 10*3/uL (ref 0.0–0.1)
EOS%: 0.5 % (ref 0.0–7.0)
Eosinophils Absolute: 0.1 10*3/uL (ref 0.0–0.5)
HEMATOCRIT: 32.1 % — AB (ref 34.8–46.6)
HEMOGLOBIN: 10.7 g/dL — AB (ref 11.6–15.9)
LYMPH#: 1.1 10*3/uL (ref 0.9–3.3)
LYMPH%: 6.8 % — ABNORMAL LOW (ref 14.0–49.7)
MCH: 32.6 pg (ref 25.1–34.0)
MCHC: 33.5 g/dL (ref 31.5–36.0)
MCV: 97.3 fL (ref 79.5–101.0)
MONO#: 0.9 10*3/uL (ref 0.1–0.9)
MONO%: 5.7 % (ref 0.0–14.0)
NEUT%: 86.8 % — ABNORMAL HIGH (ref 38.4–76.8)
NEUTROS ABS: 13.6 10*3/uL — AB (ref 1.5–6.5)
Platelets: 287 10*3/uL (ref 145–400)
RBC: 3.3 10*6/uL — ABNORMAL LOW (ref 3.70–5.45)
RDW: 16.4 % — AB (ref 11.2–14.5)
WBC: 15.7 10*3/uL — AB (ref 3.9–10.3)

## 2016-10-09 LAB — COMPREHENSIVE METABOLIC PANEL
ALBUMIN: 3.6 g/dL (ref 3.5–5.0)
ALK PHOS: 131 U/L (ref 40–150)
ALT: 19 U/L (ref 0–55)
AST: 16 U/L (ref 5–34)
Anion Gap: 9 mEq/L (ref 3–11)
BILIRUBIN TOTAL: 0.55 mg/dL (ref 0.20–1.20)
BUN: 19.5 mg/dL (ref 7.0–26.0)
CO2: 28 mEq/L (ref 22–29)
Calcium: 9.3 mg/dL (ref 8.4–10.4)
Chloride: 103 mEq/L (ref 98–109)
Creatinine: 0.9 mg/dL (ref 0.6–1.1)
EGFR: 65 mL/min/{1.73_m2} — ABNORMAL LOW (ref >90)
GLUCOSE: 90 mg/dL (ref 70–140)
POTASSIUM: 3.7 meq/L (ref 3.5–5.1)
SODIUM: 140 meq/L (ref 136–145)
TOTAL PROTEIN: 6.3 g/dL — AB (ref 6.4–8.3)

## 2016-10-09 MED ORDER — DIPHENHYDRAMINE HCL 50 MG/ML IJ SOLN
INTRAMUSCULAR | Status: AC
Start: 2016-10-09 — End: 2016-10-09
  Filled 2016-10-09: qty 1

## 2016-10-09 MED ORDER — FAMOTIDINE IN NACL 20-0.9 MG/50ML-% IV SOLN
20.0000 mg | Freq: Once | INTRAVENOUS | Status: AC
  Administered 2016-10-09: 20 mg via INTRAVENOUS

## 2016-10-09 MED ORDER — HEPARIN SOD (PORK) LOCK FLUSH 100 UNIT/ML IV SOLN
500.0000 [IU] | Freq: Once | INTRAVENOUS | Status: AC | PRN
  Administered 2016-10-09: 500 [IU]
  Filled 2016-10-09: qty 5

## 2016-10-09 MED ORDER — SODIUM CHLORIDE 0.9 % IV SOLN
20.0000 mg | Freq: Once | INTRAVENOUS | Status: AC
  Administered 2016-10-09: 20 mg via INTRAVENOUS
  Filled 2016-10-09: qty 2

## 2016-10-09 MED ORDER — DEXTROSE 5 % IV SOLN
150.0000 mg/m2 | Freq: Once | INTRAVENOUS | Status: AC
  Administered 2016-10-09: 264 mg via INTRAVENOUS
  Filled 2016-10-09: qty 44

## 2016-10-09 MED ORDER — SODIUM CHLORIDE 0.9 % IV SOLN
Freq: Once | INTRAVENOUS | Status: AC
  Administered 2016-10-09: 12:00:00 via INTRAVENOUS

## 2016-10-09 MED ORDER — CARBOPLATIN CHEMO INTRADERMAL TEST DOSE 100MCG/0.02ML
100.0000 ug | Freq: Once | INTRADERMAL | Status: AC
  Administered 2016-10-09: 100 ug via INTRADERMAL
  Filled 2016-10-09: qty 0.02

## 2016-10-09 MED ORDER — PALONOSETRON HCL INJECTION 0.25 MG/5ML
INTRAVENOUS | Status: AC
  Filled 2016-10-09: qty 5

## 2016-10-09 MED ORDER — FAMOTIDINE IN NACL 20-0.9 MG/50ML-% IV SOLN
INTRAVENOUS | Status: AC
  Filled 2016-10-09: qty 50

## 2016-10-09 MED ORDER — SODIUM CHLORIDE 0.9% FLUSH
10.0000 mL | INTRAVENOUS | Status: DC | PRN
  Administered 2016-10-09: 10 mL
  Filled 2016-10-09: qty 10

## 2016-10-09 MED ORDER — PALONOSETRON HCL INJECTION 0.25 MG/5ML
0.2500 mg | Freq: Once | INTRAVENOUS | Status: AC
  Administered 2016-10-09: 0.25 mg via INTRAVENOUS

## 2016-10-09 MED ORDER — CARBOPLATIN CHEMO INJECTION 600 MG/60ML
421.0000 mg | Freq: Once | INTRAVENOUS | Status: AC
  Administered 2016-10-09: 420 mg via INTRAVENOUS
  Filled 2016-10-09: qty 42

## 2016-10-09 MED ORDER — DIPHENHYDRAMINE HCL 50 MG/ML IJ SOLN
50.0000 mg | Freq: Once | INTRAMUSCULAR | Status: AC
  Administered 2016-10-09: 50 mg via INTRAVENOUS

## 2016-10-09 NOTE — Telephone Encounter (Signed)
Appointments scheduled per 2/5 LOS. Patient given AVS report and calendars with future scheduled appointments.  Patient stated that she usually gets the injected contrast for her CT scans. Patient refused to take the barium contrast

## 2016-10-09 NOTE — Progress Notes (Signed)
[place] [phone]   [phone]) [phone]  OFFICE PROGRESS NOTE  [name], MD [address] [place] [address]  DIAGNOSIS: Recurrent non-small cell lung cancer, squamous cell carcinoma initially diagnosed as unresectable a stage IIIa in February 2015.  PRIOR THERAPY: 1) Status post exploratory right VATS with mediastinal biopsy under the care of Dr. Roxan Hockey on 11/13/2013. The tumor was found to be unresectable at that time.[name] 2) Concurrent chemoradiation with weekly carboplatin for AUC of 2 and paclitaxel 45 mg/M2, status post 6 cycles with partial response. First cycle was given on 12/01/2013. 3) Consolidation chemotherapy with carboplatin for AUC of 5 and paclitaxel 175 mg/M2 every 3 weeks with Neulasta support. First dose 02/23/2014. Status post 3 cycles with partial response.  CURRENT THERAPY: Systemic chemotherapy with carboplatin for AUC of 5 and paclitaxel 175 MG/M2 every 3 weeks with Neulasta support. Status post 2 cycles. First dose was given on 08/14/2016.  INTERVAL HISTORY: Kimberly [name] 76 y.o. female came to the clinic today for follow-up visit. 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 She had rash on the forearm last week after her treatment that probably was either insect bite or neutrophilic dermatitis. She was treated with Medrol Dosepak and she has significant improvement in her rash. She denied having any current fever or chills. She has no nausea, vomiting, diarrhea or constipation. She denied having any chest pain but continues to have shortness breath with exertion with no cough or hemoptysis. She is here today for evaluation before starting cycle #3.   MEDICAL HISTORY: Past Medical History:  Diagnosis Date  . Allergic asthma without acute exacerbation or status asthmaticus   . Aortic insufficiency   . Arthritis   . Atrial  fibrillation ([place])   . Back pain 08/14/2016  . Bronchitis   . Cough    dry, endobronchial mass  . Dyslipidemia   . Family history of adverse reaction to anesthesia    pts son also experiences N/V  . GERD (gastroesophageal reflux disease)   . Heart murmur   . History of blood transfusion   . History of bronchitis   . History of chemotherapy   . History of nuclear stress test 02/26/2006   exercise myoview; normal pattern of perfusion; low risk scan   . Hypertension   . Hypothyroidism   . Mitral insufficiency   . Pneumonia   . PONV (postoperative nausea and vomiting)   . Radiation 12/01/13-01/08/14   50.4 gray to right central chest  . Shortness of breath    with exertion  . Squamous cell carcinoma of lung ([place])   . Syncope    "states she has passed out a few times. dr is trying to find cause.last time when geeting ready to go home after video bronch/bx  . Thyroid disease     ALLERGIES:  is allergic to lactose intolerance (gi); amiodarone; augmentin [amoxicillin-pot clavulanate]; and milk-related compounds.  MEDICATIONS:  Current Outpatient Prescriptions  Medication Sig Dispense Refill  . albuterol (PROVENTIL HFA;VENTOLIN HFA) 108 (90 BASE) MCG/ACT inhaler Inhale 1 puff into the lungs every 6 (six) hours as needed for wheezing or shortness of breath.     [name] amLODipine (NORVASC) 5 MG tablet Take 1 tablet (5 mg total) by mouth daily. 30 tablet 0  . aspirin EC 81 MG tablet Take 81 mg by mouth daily.    [name] atorvastatin (LIPITOR) 20 MG  tablet Take 20 mg by mouth every morning.     . Calcium Carb-Cholecalciferol (CALCIUM 600 + D PO) Take 1 tablet by mouth daily.    . chlorthalidone (HYGROTON) 25 MG tablet TAKE 1 TABLET BY MOUTH  DAILY 90 tablet 1  . Cholecalciferol (VITAMIN D3) 1000 units CAPS Take 1,000 Units by mouth daily.    . diphenoxylate-atropine (LOMOTIL) 2.5-0.025 MG tablet Take 2 tablets by mouth 4 (four) times daily as needed for diarrhea or loose stools. 30 tablet 0  .  levothyroxine (SYNTHROID, LEVOTHROID) 150 MCG tablet Take 150 mcg by mouth as directed. Take 1 tablet once every Tuesday, Thursday, Saturday, and Sunday.    . lidocaine-prilocaine (EMLA) cream Apply 1 application topically as needed. 30 g 0  . loperamide (IMODIUM) 2 MG capsule Take 2 mg by mouth as needed for diarrhea or loose stools.    . meloxicam (MOBIC) 7.5 MG tablet Take 7.5 mg by mouth 2 (two) times daily as needed for pain.    . methocarbamol (ROBAXIN) 500 MG tablet Take 500 mg by mouth every 8 (eight) hours as needed for muscle spasms.    . metoprolol succinate (TOPROL-XL) 25 MG 24 hr tablet Take one-half tablet by  mouth daily 45 tablet 3  . montelukast (SINGULAIR) 10 MG tablet Take 10 mg by mouth at bedtime.     . Multiple Vitamins-Iron (MULTIVITAMIN/IRON PO) Take 1 tablet by mouth daily.     . ondansetron (ZOFRAN) 8 MG tablet Take 1 tablet (8 mg total) by mouth every 8 (eight) hours as needed for refractory nausea / vomiting (start on Day 3 after each treatment). 20 tablet 0  . prochlorperazine (COMPAZINE) 10 MG tablet Take 1 tablet (10 mg total) by mouth every 6 (six) hours as needed for nausea or vomiting. 30 tablet 1   No current facility-administered medications for this visit.     SURGICAL HISTORY:  Past Surgical History:  Procedure Laterality Date  . CARDIAC CATHETERIZATION  07/08/2008   normal coronaries  . CARPAL TUNNEL RELEASE Right 1988  . COLONOSCOPY N/A 02/11/2016   Procedure: COLONOSCOPY;  Surgeon: Carol Ada, MD;  Location: [facility];  Service: Endoscopy;  Laterality: N/A;  . DILATION AND CURETTAGE OF UTERUS    . EYE SURGERY Bilateral   . H/O MET Test w/PFT  04/02/2012   low risk; peak VO2 77% predicted  . IR GENERIC HISTORICAL  09/12/2016   IR FLUORO GUIDE PORT INSERTION RIGHT 09/12/2016 [name], MD [facility]  . IR GENERIC HISTORICAL  09/12/2016   IR US GUIDE VASC ACCESS RIGHT 09/12/2016 [name], MD [facility]  . JOINT REPLACEMENT  2003    thumb rt  . KNEE ARTHROSCOPY  12   rt meniscus  . MEDIASTINOSCOPY N/A 10/30/2013   Procedure: MEDIASTINOSCOPY;  Surgeon: Melrose Nakayama, MD;  Location: [facility];  Service: Thoracic;  Laterality: N/A;  . ROTATOR CUFF REPAIR  2006   ? side  . THYROIDECTOMY  1973  . TRANSTHORACIC ECHOCARDIOGRAM  09/25/2012   EF 55-60%; mild LVH & mild concentric hypertrophy; mild AV regurg; RV systolic pressure increase consistent with mild pulm HTN  . VIDEO ASSISTED THORACOSCOPY (VATS)/ LOBECTOMY Right 11/13/2013   Procedure: VIDEO ASSISTED THORACOSCOPY (VATS) with mediastinal  biopsies;  Surgeon: Melrose Nakayama, MD;  Location: [facility];  Service: Thoracic;  Laterality: Right;  RIGHT VATS,mediastinal biopsies  . VIDEO BRONCHOSCOPY Bilateral 09/25/2013   Procedure: VIDEO BRONCHOSCOPY WITHOUT FLUORO;  Surgeon: Tanda Rockers, MD;  Location: [facility];  Service: Cardiopulmonary;  Laterality: Bilateral;  . VIDEO BRONCHOSCOPY WITH ENDOBRONCHIAL ULTRASOUND N/A 10/30/2013   Procedure: VIDEO BRONCHOSCOPY WITH ENDOBRONCHIAL ULTRASOUND;  Surgeon: Melrose Nakayama, MD;  Location: [facility];  Service: Thoracic;  Laterality: N/A;    REVIEW OF SYSTEMS:  A comprehensive review of systems was negative except for: Constitutional: positive for fatigue Neurological: positive for paresthesia   PHYSICAL EXAMINATION: General appearance: alert, cooperative, fatigued and no distress Head: Normocephalic, without obvious abnormality, atraumatic Neck: no adenopathy, no JVD, supple, symmetrical, trachea midline and thyroid not enlarged, symmetric, no tenderness/mass/nodules Lymph nodes: Cervical, supraclavicular, and axillary nodes normal. Resp: clear to auscultation bilaterally Back: symmetric, no curvature. ROM normal. No CVA tenderness. Cardio: regular rate and rhythm, S1, S2 normal, no murmur, click, rub or gallop GI: soft, non-tender; bowel sounds normal; no masses,  no organomegaly Extremities: extremities normal,  atraumatic, no cyanosis or edema  ECOG PERFORMANCE STATUS: 1 - Symptomatic but completely ambulatory  Blood pressure 134/66, pulse 98, temperature 98.7 F (37.1 C), temperature source Oral, resp. rate 18, height '4\' 10"'  (1.473 m), weight 160 lb 14.4 oz (73 kg), SpO2 100 %.  LABORATORY DATA: Lab Results  Component Value Date   WBC 15.7 (H) 10/09/2016   HGB 10.7 (L) 10/09/2016   HCT 32.1 (L) 10/09/2016   MCV 97.3 10/09/2016   PLT 287 10/09/2016      Chemistry      Component Value Date/Time   NA 140 10/09/2016 0945   K 3.7 10/09/2016 0945   CL 106 08/22/2016 0552   CO2 28 10/09/2016 0945   BUN 19.5 10/09/2016 0945   CREATININE 0.9 10/09/2016 0945      Component Value Date/Time   CALCIUM 9.3 10/09/2016 0945   ALKPHOS 131 10/09/2016 0945   AST 16 10/09/2016 0945   ALT 19 10/09/2016 0945   BILITOT 0.55 10/09/2016 0945       RADIOGRAPHIC STUDIES: Ir US Guide Vasc Access Right  Result Date: 09/12/2016 INDICATION: 76 year old female with recurrent squamous cell carcinoma of the right lung. She requires durable venous access for chemotherapy. EXAM: IMPLANTED PORT A CATH PLACEMENT WITH ULTRASOUND AND FLUOROSCOPIC GUIDANCE MEDICATIONS: 2 g Ancef; The antibiotic was administered within an appropriate time interval prior to skin puncture. ANESTHESIA/SEDATION: Versed 5 mg IV; Fentanyl 75 mcg IV; Moderate Sedation Time:  31 minutes The patient was continuously monitored during the procedure by the interventional radiology nurse under my direct supervision. FLUOROSCOPY TIME:  2 minutes, 54 seconds (18 mGy) COMPLICATIONS: None immediate. PROCEDURE: The right neck and chest was prepped with chlorhexidine, and draped in the usual sterile fashion using maximum barrier technique (cap and mask, sterile gown, sterile gloves, large sterile sheet, hand hygiene and cutaneous antiseptic). Antibiotic prophylaxis was provided with 2g Ancef administered IV one hour prior to skin incision. Local anesthesia was  attained by infiltration with 1% lidocaine with epinephrine. Ultrasound demonstrated patency of the right internal jugular vein, and this was documented with an image. Under real-time ultrasound guidance, this vein was accessed with a 21 gauge micropuncture needle and image documentation was performed. A small dermatotomy was made at the access site with an 11 scalpel. A 0.018" wire was advanced into the SVC and the access needle exchanged for a 37F micropuncture vascular sheath. The 0.018" wire was then removed and a 0.035" wire advanced into the IVC. An appropriate location for the subcutaneous reservoir was selected below the clavicle and an incision was made through the skin and underlying soft tissues. The subcutaneous  tissues were then dissected using a combination of blunt and sharp surgical technique and a pocket was formed. A single lumen power injectable portacatheter was then tunneled through the subcutaneous tissues from the pocket to the dermatotomy and the port reservoir placed within the subcutaneous pocket. The venous access site was then serially dilated and a peel away vascular sheath placed over the wire. The wire was removed and the port catheter advanced into position under fluoroscopic guidance. The catheter tip is positioned in the distal SVC. This was documented with a spot image. The portacatheter was then tested and found to flush and aspirate well. The port was flushed with saline followed by 100 units/mL heparinized saline. The pocket was then closed in two layers using first subdermal inverted interrupted absorbable sutures followed by a running subcuticular suture. The epidermis was then sealed with Dermabond. The dermatotomy at the venous access site was also sealed with Dermabond. IMPRESSION: Successful placement of a right IJ approach Power Port with ultrasound and fluoroscopic guidance. The catheter is ready for use. Signed, Criselda Peaches, MD [facility] Electronically Signed   By: [name] M.D.   On: 09/12/2016 10:49   Ir Fluoro Guide Port Insertion Right  Result Date: 09/12/2016 INDICATION: 76 year old female with recurrent squamous cell carcinoma of the right lung. She requires durable venous access for chemotherapy. EXAM: IMPLANTED PORT A CATH PLACEMENT WITH ULTRASOUND AND FLUOROSCOPIC GUIDANCE MEDICATIONS: 2 g Ancef; The antibiotic was administered within an appropriate time interval prior to skin puncture. ANESTHESIA/SEDATION: Versed 5 mg IV; Fentanyl 75 mcg IV; Moderate Sedation Time:  31 minutes The patient was continuously monitored during the procedure by the interventional radiology nurse under my direct supervision. FLUOROSCOPY TIME:  2 minutes, 54 seconds (18 mGy) COMPLICATIONS: None immediate. PROCEDURE: The right neck and chest was prepped with chlorhexidine, and draped in the usual sterile fashion using maximum barrier technique (cap and mask, sterile gown, sterile gloves, large sterile sheet, hand hygiene and cutaneous antiseptic). Antibiotic prophylaxis was provided with 2g Ancef administered IV one hour prior to skin incision. Local anesthesia was attained by infiltration with 1% lidocaine with epinephrine. Ultrasound demonstrated patency of the right internal jugular vein, and this was documented with an image. Under real-time ultrasound guidance, this vein was accessed with a 21 gauge micropuncture needle and image documentation was performed. A small dermatotomy was made at the access site with an 11 scalpel. A 0.018" wire was advanced into the SVC and the access needle exchanged for a 75F micropuncture vascular sheath. The 0.018" wire was then removed and a 0.035" wire advanced into the IVC. An appropriate location for the subcutaneous reservoir was selected below the clavicle and an incision was made through the skin and underlying soft tissues. The subcutaneous tissues were then dissected  using a combination of blunt and sharp surgical technique and a pocket was formed. A single lumen power injectable portacatheter was then tunneled through the subcutaneous tissues from the pocket to the dermatotomy and the port reservoir placed within the subcutaneous pocket. The venous access site was then serially dilated and a peel away vascular sheath placed over the wire. The wire was removed and the port catheter advanced into position under fluoroscopic guidance. The catheter tip is positioned in the distal SVC. This was documented with a spot image. The portacatheter was then tested and found to flush and aspirate well. The port was flushed with saline followed by 100 units/mL heparinized saline. The pocket was  then closed in two layers using first subdermal inverted interrupted absorbable sutures followed by a running subcuticular suture. The epidermis was then sealed with Dermabond. The dermatotomy at the venous access site was also sealed with Dermabond. IMPRESSION: Successful placement of a right IJ approach Power Port with ultrasound and fluoroscopic guidance. The catheter is ready for use. Signed, Criselda Peaches, MD [facility] Electronically Signed   By: [name] M.D.   On: 09/12/2016 10:49    ASSESSMENT AND PLAN: This is a very pleasant 76 years old white female with recurrent non-small cell lung cancer, squamous cell carcinoma. She is currently undergoing systemic chemotherapy with carboplatin and paclitaxel status post 2 cycles. She is tolerating her treatment well except for the mild peripheral neuropathy and fatigue. I recommended for the patient to proceed with cycle #3 today as a scheduled. I will see her back for follow-up visit in 3 weeks for evaluation after repeating CT scan of the chest, abdomen and pelvis for restaging of her disease. For the peripheral neuropathy, I would cut the dose of paclitaxel to 150  MG/M2 starting from cycle #3. She was advised to call immediately if she has any concerning symptoms in the interval. The patient voices understanding of current disease status and treatment options and is in agreement with the current care plan.  All questions were answered. The patient knows to call the clinic with any problems, questions or concerns. We can certainly see the patient much sooner if necessary.  Disclaimer: This note was dictated with voice recognition software. Similar sounding words can inadvertently be transcribed and may not be corrected upon review.

## 2016-10-09 NOTE — Patient Instructions (Signed)
[facility] Discharge Instructions for Patients Receiving Chemotherapy  Today you received the following chemotherapy agents Taxol and Carboplatin   To help prevent nausea and vomiting after your treatment, we enco aurage you to take your nausea medication as directed. No Zofran for 3 days. Take Compazine instead.    If you develop nausea and vomiting that is not controlled by your nausea medication, call the clinic.   BELOW ARE SYMPTOMS THAT SHOULD BE REPORTED IMMEDIATELY:  *FEVER GREATER THAN 100.5 F  *CHILLS WITH OR WITHOUT FEVER  NAUSEA AND VOMITING THAT IS NOT CONTROLLED WITH YOUR NAUSEA MEDICATION  *UNUSUAL SHORTNESS OF BREATH  *UNUSUAL BRUISING OR BLEEDING  TENDERNESS IN MOUTH AND THROAT WITH OR WITHOUT PRESENCE OF ULCERS  *URINARY PROBLEMS  *BOWEL PROBLEMS  UNUSUAL RASH Items with * indicate a potential emergency and should be followed up as soon as possible.  Feel free to call the clinic you have any questions or concerns. The clinic phone number is [phone].  Please show the [place] at check-in to the Emergency Department and triage nurse.

## 2016-10-11 ENCOUNTER — Ambulatory Visit (HOSPITAL_BASED_OUTPATIENT_CLINIC_OR_DEPARTMENT_OTHER): Payer: Medicare Other

## 2016-10-11 VITALS — BP 138/84 | HR 94 | Temp 98.4°F | Resp 20

## 2016-10-11 DIAGNOSIS — Z5189 Encounter for other specified aftercare: Secondary | ICD-10-CM | POA: Diagnosis not present

## 2016-10-11 DIAGNOSIS — C342 Malignant neoplasm of middle lobe, bronchus or lung: Secondary | ICD-10-CM | POA: Diagnosis not present

## 2016-10-11 MED ORDER — PEGFILGRASTIM INJECTION 6 MG/0.6ML ~~LOC~~
6.0000 mg | PREFILLED_SYRINGE | Freq: Once | SUBCUTANEOUS | Status: AC
  Administered 2016-10-11: 6 mg via SUBCUTANEOUS
  Filled 2016-10-11: qty 0.6

## 2016-10-11 NOTE — Patient Instructions (Signed)
Pegfilgrastim injection What is this medicine? PEGFILGRASTIM (PEG fil gra stim) is a long-acting granulocyte colony-stimulating factor that stimulates the growth of neutrophils, a type of white blood cell important in the body's fight against infection. It is used to reduce the incidence of fever and infection in patients with certain types of cancer who are receiving chemotherapy that affects the bone marrow, and to increase survival after being exposed to high doses of radiation. This medicine may be used for other purposes; ask your health care provider or pharmacist if you have questions. COMMON BRAND NAME(S): Neulasta What should I tell my health care provider before I take this medicine? They need to know if you have any of these conditions: -kidney disease -latex allergy -ongoing radiation therapy -sickle cell disease -skin reactions to acrylic adhesives (On-Body Injector only) -an unusual or allergic reaction to pegfilgrastim, filgrastim, other medicines, foods, dyes, or preservatives -pregnant or trying to get pregnant -breast-feeding How should I use this medicine? This medicine is for injection under the skin. If you get this medicine at home, you will be taught how to prepare and give the pre-filled syringe or how to use the On-body Injector. Refer to the patient Instructions for Use for detailed instructions. Use exactly as directed. Take your medicine at regular intervals. Do not take your medicine more often than directed. It is important that you put your used needles and syringes in a special sharps container. Do not put them in a trash can. If you do not have a sharps container, call your pharmacist or healthcare provider to get one. Talk to your pediatrician regarding the use of this medicine in children. While this drug may be prescribed for selected conditions, precautions do apply. Overdosage: If you think you have taken too much of this medicine contact a poison control  center or emergency room at once. NOTE: This medicine is only for you. Do not share this medicine with others. What if I miss a dose? It is important not to miss your dose. Call your doctor or health care professional if you miss your dose. If you miss a dose due to an On-body Injector failure or leakage, a new dose should be administered as soon as possible using a single prefilled syringe for manual use. What may interact with this medicine? Interactions have not been studied. Give your health care provider a list of all the medicines, herbs, non-prescription drugs, or dietary supplements you use. Also tell them if you smoke, drink alcohol, or use illegal drugs. Some items may interact with your medicine. This list may not describe all possible interactions. Give your health care provider a list of all the medicines, herbs, non-prescription drugs, or dietary supplements you use. Also tell them if you smoke, drink alcohol, or use illegal drugs. Some items may interact with your medicine. What should I watch for while using this medicine? You may need blood work done while you are taking this medicine. If you are going to need a MRI, CT scan, or other procedure, tell your doctor that you are using this medicine (On-Body Injector only). What side effects may I notice from receiving this medicine? Side effects that you should report to your doctor or health care professional as soon as possible: -allergic reactions like skin rash, itching or hives, swelling of the face, lips, or tongue -dizziness -fever -pain, redness, or irritation at site where injected -pinpoint red spots on the skin -red or dark-brown urine -shortness of breath or breathing problems -stomach or   side pain, or pain at the shoulder -swelling -tiredness -trouble passing urine or change in the amount of urine Side effects that usually do not require medical attention (report to your doctor or health care professional if they  continue or are bothersome): -bone pain -muscle pain This list may not describe all possible side effects. Call your doctor for medical advice about side effects. You may report side effects to FDA at 1-800-FDA-1088. Where should I keep my medicine? Keep out of the reach of children. Store pre-filled syringes in a refrigerator between 2 and 8 degrees C (36 and 46 degrees F). Do not freeze. Keep in carton to protect from light. Throw away this medicine if it is left out of the refrigerator for more than 48 hours. Throw away any unused medicine after the expiration date. NOTE: This sheet is a summary. It may not cover all possible information. If you have questions about this medicine, talk to your doctor, pharmacist, or health care provider.  2017 Elsevier/Gold Standard (2014-09-10 14:30:14)  

## 2016-10-15 DIAGNOSIS — R109 Unspecified abdominal pain: Secondary | ICD-10-CM | POA: Diagnosis not present

## 2016-10-15 DIAGNOSIS — K5900 Constipation, unspecified: Secondary | ICD-10-CM | POA: Diagnosis not present

## 2016-10-16 ENCOUNTER — Other Ambulatory Visit: Payer: Medicare Other

## 2016-10-16 ENCOUNTER — Ambulatory Visit: Payer: Medicare Other | Admitting: Internal Medicine

## 2016-10-16 ENCOUNTER — Telehealth: Payer: Self-pay | Admitting: Medical Oncology

## 2016-10-16 ENCOUNTER — Other Ambulatory Visit (HOSPITAL_BASED_OUTPATIENT_CLINIC_OR_DEPARTMENT_OTHER): Payer: Medicare Other

## 2016-10-16 ENCOUNTER — Ambulatory Visit: Payer: Medicare Other

## 2016-10-16 DIAGNOSIS — C342 Malignant neoplasm of middle lobe, bronchus or lung: Secondary | ICD-10-CM | POA: Diagnosis not present

## 2016-10-16 LAB — COMPREHENSIVE METABOLIC PANEL
ALBUMIN: 3 g/dL — AB (ref 3.5–5.0)
ALT: 25 U/L (ref 0–55)
ANION GAP: 9 meq/L (ref 3–11)
AST: 22 U/L (ref 5–34)
Alkaline Phosphatase: 86 U/L (ref 40–150)
BUN: 19 mg/dL (ref 7.0–26.0)
CO2: 31 meq/L — AB (ref 22–29)
CREATININE: 1 mg/dL (ref 0.6–1.1)
Calcium: 9.2 mg/dL (ref 8.4–10.4)
Chloride: 93 mEq/L — ABNORMAL LOW (ref 98–109)
EGFR: 58 mL/min/{1.73_m2} — AB (ref >90)
GLUCOSE: 130 mg/dL (ref 70–140)
Potassium: 3.6 mEq/L (ref 3.5–5.1)
Sodium: 133 mEq/L — ABNORMAL LOW (ref 136–145)
TOTAL PROTEIN: 6 g/dL — AB (ref 6.4–8.3)
Total Bilirubin: 0.63 mg/dL (ref 0.20–1.20)

## 2016-10-16 LAB — CBC WITH DIFFERENTIAL/PLATELET
BASO%: 1.5 % (ref 0.0–2.0)
Basophils Absolute: 0 10*3/uL (ref 0.0–0.1)
EOS ABS: 0.1 10*3/uL (ref 0.0–0.5)
EOS%: 6.9 % (ref 0.0–7.0)
HCT: 27 % — ABNORMAL LOW (ref 34.8–46.6)
HGB: 9.3 g/dL — ABNORMAL LOW (ref 11.6–15.9)
LYMPH%: 43.8 % (ref 14.0–49.7)
MCH: 32.7 pg (ref 25.1–34.0)
MCHC: 34.4 g/dL (ref 31.5–36.0)
MCV: 95.1 fL (ref 79.5–101.0)
MONO#: 0.4 10*3/uL (ref 0.1–0.9)
MONO%: 27.7 % — ABNORMAL HIGH (ref 0.0–14.0)
NEUT%: 20.1 % — AB (ref 38.4–76.8)
NEUTROS ABS: 0.3 10*3/uL — AB (ref 1.5–6.5)
PLATELETS: 136 10*3/uL — AB (ref 145–400)
RBC: 2.84 10*6/uL — AB (ref 3.70–5.45)
RDW: 15.3 % — ABNORMAL HIGH (ref 11.2–14.5)
WBC: 1.3 10*3/uL — AB (ref 3.9–10.3)
lymph#: 0.6 10*3/uL — ABNORMAL LOW (ref 0.9–3.3)

## 2016-10-16 NOTE — Telephone Encounter (Signed)
F/u call from 10/14/16 on call for abd cramps , constipation. She feels ' 100% better' . EMS came and gave her IVF at home and pt refused transport to ED. I reviewed neutropenic precautions with pt and she voiced understanding.

## 2016-10-18 ENCOUNTER — Ambulatory Visit: Payer: Medicare Other

## 2016-10-23 ENCOUNTER — Other Ambulatory Visit: Payer: Medicare Other

## 2016-10-23 ENCOUNTER — Other Ambulatory Visit (HOSPITAL_BASED_OUTPATIENT_CLINIC_OR_DEPARTMENT_OTHER): Payer: Medicare Other

## 2016-10-23 DIAGNOSIS — C342 Malignant neoplasm of middle lobe, bronchus or lung: Secondary | ICD-10-CM

## 2016-10-23 LAB — COMPREHENSIVE METABOLIC PANEL
ALT: 20 U/L (ref 0–55)
AST: 21 U/L (ref 5–34)
Albumin: 3.3 g/dL — ABNORMAL LOW (ref 3.5–5.0)
Alkaline Phosphatase: 111 U/L (ref 40–150)
Anion Gap: 10 mEq/L (ref 3–11)
BUN: 13.8 mg/dL (ref 7.0–26.0)
CHLORIDE: 101 meq/L (ref 98–109)
CO2: 30 mEq/L — ABNORMAL HIGH (ref 22–29)
Calcium: 9.3 mg/dL (ref 8.4–10.4)
Creatinine: 0.8 mg/dL (ref 0.6–1.1)
EGFR: 73 mL/min/{1.73_m2} — AB (ref >90)
GLUCOSE: 105 mg/dL (ref 70–140)
POTASSIUM: 3.5 meq/L (ref 3.5–5.1)
SODIUM: 140 meq/L (ref 136–145)
Total Bilirubin: 0.24 mg/dL (ref 0.20–1.20)
Total Protein: 6.2 g/dL — ABNORMAL LOW (ref 6.4–8.3)

## 2016-10-23 LAB — CBC WITH DIFFERENTIAL/PLATELET
BASO%: 0.4 % (ref 0.0–2.0)
Basophils Absolute: 0 10*3/uL (ref 0.0–0.1)
EOS%: 0.6 % (ref 0.0–7.0)
Eosinophils Absolute: 0.1 10*3/uL (ref 0.0–0.5)
HCT: 26.2 % — ABNORMAL LOW (ref 34.8–46.6)
HEMOGLOBIN: 8.8 g/dL — AB (ref 11.6–15.9)
LYMPH#: 1.4 10*3/uL (ref 0.9–3.3)
LYMPH%: 12.1 % — ABNORMAL LOW (ref 14.0–49.7)
MCH: 33.3 pg (ref 25.1–34.0)
MCHC: 33.7 g/dL (ref 31.5–36.0)
MCV: 98.7 fL (ref 79.5–101.0)
MONO#: 0.7 10*3/uL (ref 0.1–0.9)
MONO%: 5.5 % (ref 0.0–14.0)
NEUT#: 9.6 10*3/uL — ABNORMAL HIGH (ref 1.5–6.5)
NEUT%: 81.4 % — ABNORMAL HIGH (ref 38.4–76.8)
Platelets: 167 10*3/uL (ref 145–400)
RBC: 2.65 10*6/uL — ABNORMAL LOW (ref 3.70–5.45)
RDW: 15.9 % — AB (ref 11.2–14.5)
WBC: 11.8 10*3/uL — ABNORMAL HIGH (ref 3.9–10.3)

## 2016-10-27 ENCOUNTER — Ambulatory Visit (HOSPITAL_COMMUNITY)
Admission: RE | Admit: 2016-10-27 | Discharge: 2016-10-27 | Disposition: A | Discharge status: Home / Self Care | Payer: Medicare Other | Source: Ambulatory Visit | Attending: Internal Medicine | Admitting: Internal Medicine

## 2016-10-27 ENCOUNTER — Encounter (HOSPITAL_COMMUNITY): Payer: Self-pay

## 2016-10-27 DIAGNOSIS — C342 Malignant neoplasm of middle lobe, bronchus or lung: Secondary | ICD-10-CM | POA: Diagnosis not present

## 2016-10-27 DIAGNOSIS — I10 Essential (primary) hypertension: Secondary | ICD-10-CM

## 2016-10-27 DIAGNOSIS — Z5111 Encounter for antineoplastic chemotherapy: Secondary | ICD-10-CM | POA: Insufficient documentation

## 2016-10-27 DIAGNOSIS — R5383 Other fatigue: Secondary | ICD-10-CM

## 2016-10-27 DIAGNOSIS — C3490 Malignant neoplasm of unspecified part of unspecified bronchus or lung: Secondary | ICD-10-CM | POA: Diagnosis not present

## 2016-10-27 MED ORDER — SODIUM CHLORIDE 0.9 % IJ SOLN
INTRAMUSCULAR | Status: AC
  Filled 2016-10-27: qty 50

## 2016-10-27 MED ORDER — IOPAMIDOL (ISOVUE-300) INJECTION 61%
30.0000 mL | Freq: Once | INTRAVENOUS | Status: AC | PRN
  Administered 2016-10-27: 30 mL via ORAL

## 2016-10-27 MED ORDER — IOPAMIDOL (ISOVUE-300) INJECTION 61%
INTRAVENOUS | Status: AC
  Filled 2016-10-27: qty 30

## 2016-10-27 MED ORDER — IOPAMIDOL (ISOVUE-300) INJECTION 61%
INTRAVENOUS | Status: AC
  Administered 2016-10-27: 100 mL
  Filled 2016-10-27: qty 100

## 2016-10-27 MED ORDER — HEPARIN SOD (PORK) LOCK FLUSH 100 UNIT/ML IV SOLN
INTRAVENOUS | Status: AC
  Administered 2016-10-27: 500 [IU]
  Filled 2016-10-27: qty 5

## 2016-10-30 ENCOUNTER — Ambulatory Visit (HOSPITAL_BASED_OUTPATIENT_CLINIC_OR_DEPARTMENT_OTHER): Payer: Medicare Other | Admitting: Internal Medicine

## 2016-10-30 ENCOUNTER — Ambulatory Visit (HOSPITAL_BASED_OUTPATIENT_CLINIC_OR_DEPARTMENT_OTHER): Payer: Medicare Other

## 2016-10-30 ENCOUNTER — Other Ambulatory Visit (HOSPITAL_BASED_OUTPATIENT_CLINIC_OR_DEPARTMENT_OTHER): Payer: Medicare Other

## 2016-10-30 ENCOUNTER — Other Ambulatory Visit: Payer: Medicare Other

## 2016-10-30 ENCOUNTER — Encounter: Payer: Self-pay | Admitting: Internal Medicine

## 2016-10-30 ENCOUNTER — Telehealth: Payer: Self-pay | Admitting: Internal Medicine

## 2016-10-30 VITALS — BP 160/81 | HR 105 | Temp 98.8°F | Resp 18 | Ht <= 58 in | Wt 162.1 lb

## 2016-10-30 DIAGNOSIS — R531 Weakness: Secondary | ICD-10-CM | POA: Diagnosis not present

## 2016-10-30 DIAGNOSIS — D649 Anemia, unspecified: Secondary | ICD-10-CM

## 2016-10-30 DIAGNOSIS — Z5111 Encounter for antineoplastic chemotherapy: Secondary | ICD-10-CM | POA: Diagnosis not present

## 2016-10-30 DIAGNOSIS — T451X5A Adverse effect of antineoplastic and immunosuppressive drugs, initial encounter: Secondary | ICD-10-CM

## 2016-10-30 DIAGNOSIS — I9589 Other hypotension: Secondary | ICD-10-CM | POA: Diagnosis not present

## 2016-10-30 DIAGNOSIS — D6481 Anemia due to antineoplastic chemotherapy: Secondary | ICD-10-CM

## 2016-10-30 DIAGNOSIS — C342 Malignant neoplasm of middle lobe, bronchus or lung: Secondary | ICD-10-CM

## 2016-10-30 DIAGNOSIS — I10 Essential (primary) hypertension: Secondary | ICD-10-CM

## 2016-10-30 DIAGNOSIS — D701 Agranulocytosis secondary to cancer chemotherapy: Secondary | ICD-10-CM

## 2016-10-30 DIAGNOSIS — E860 Dehydration: Secondary | ICD-10-CM | POA: Diagnosis not present

## 2016-10-30 LAB — COMPREHENSIVE METABOLIC PANEL
ALT: 14 U/L (ref 0–55)
ANION GAP: 6 meq/L (ref 3–11)
AST: 17 U/L (ref 5–34)
Albumin: 3.5 g/dL (ref 3.5–5.0)
Alkaline Phosphatase: 112 U/L (ref 40–150)
BUN: 19.8 mg/dL (ref 7.0–26.0)
CALCIUM: 9.4 mg/dL (ref 8.4–10.4)
CHLORIDE: 106 meq/L (ref 98–109)
CO2: 28 meq/L (ref 22–29)
Creatinine: 0.9 mg/dL (ref 0.6–1.1)
EGFR: 67 mL/min/{1.73_m2} — ABNORMAL LOW (ref >90)
Glucose: 85 mg/dl (ref 70–140)
POTASSIUM: 4 meq/L (ref 3.5–5.1)
Sodium: 140 mEq/L (ref 136–145)
Total Bilirubin: 0.32 mg/dL (ref 0.20–1.20)
Total Protein: 6.5 g/dL (ref 6.4–8.3)

## 2016-10-30 LAB — CBC WITH DIFFERENTIAL/PLATELET
BASO%: 0.6 % (ref 0.0–2.0)
Basophils Absolute: 0 10*3/uL (ref 0.0–0.1)
EOS%: 1.2 % (ref 0.0–7.0)
Eosinophils Absolute: 0.1 10*3/uL (ref 0.0–0.5)
HCT: 27.8 % — ABNORMAL LOW (ref 34.8–46.6)
HEMOGLOBIN: 9.2 g/dL — AB (ref 11.6–15.9)
LYMPH#: 1.1 10*3/uL (ref 0.9–3.3)
LYMPH%: 17.2 % (ref 14.0–49.7)
MCH: 33.1 pg (ref 25.1–34.0)
MCHC: 33.1 g/dL (ref 31.5–36.0)
MCV: 100 fL (ref 79.5–101.0)
MONO#: 0.5 10*3/uL (ref 0.1–0.9)
MONO%: 8 % (ref 0.0–14.0)
NEUT%: 73 % (ref 38.4–76.8)
NEUTROS ABS: 4.8 10*3/uL (ref 1.5–6.5)
PLATELETS: 170 10*3/uL (ref 145–400)
RBC: 2.78 10*6/uL — ABNORMAL LOW (ref 3.70–5.45)
RDW: 17.5 % — AB (ref 11.2–14.5)
WBC: 6.6 10*3/uL (ref 3.9–10.3)

## 2016-10-30 MED ORDER — FAMOTIDINE IN NACL 20-0.9 MG/50ML-% IV SOLN
20.0000 mg | Freq: Once | INTRAVENOUS | Status: AC
  Administered 2016-10-30: 20 mg via INTRAVENOUS

## 2016-10-30 MED ORDER — HEPARIN SOD (PORK) LOCK FLUSH 100 UNIT/ML IV SOLN
500.0000 [IU] | Freq: Once | INTRAVENOUS | Status: AC | PRN
  Administered 2016-10-30: 500 [IU]
  Filled 2016-10-30: qty 5

## 2016-10-30 MED ORDER — PALONOSETRON HCL INJECTION 0.25 MG/5ML
INTRAVENOUS | Status: AC
  Filled 2016-10-30: qty 5

## 2016-10-30 MED ORDER — PALONOSETRON HCL INJECTION 0.25 MG/5ML
0.2500 mg | Freq: Once | INTRAVENOUS | Status: AC
  Administered 2016-10-30: 0.25 mg via INTRAVENOUS

## 2016-10-30 MED ORDER — CARBOPLATIN CHEMO INTRADERMAL TEST DOSE 100MCG/0.02ML
100.0000 ug | Freq: Once | INTRADERMAL | Status: AC
  Administered 2016-10-30: 100 ug via INTRADERMAL
  Filled 2016-10-30: qty 0.02

## 2016-10-30 MED ORDER — SODIUM CHLORIDE 0.9 % IV SOLN
Freq: Once | INTRAVENOUS | Status: AC
  Administered 2016-10-30: 14:00:00 via INTRAVENOUS

## 2016-10-30 MED ORDER — DIPHENHYDRAMINE HCL 50 MG/ML IJ SOLN
INTRAMUSCULAR | Status: AC
  Filled 2016-10-30: qty 1

## 2016-10-30 MED ORDER — SODIUM CHLORIDE 0.9 % IV SOLN
20.0000 mg | Freq: Once | INTRAVENOUS | Status: AC
  Administered 2016-10-30: 20 mg via INTRAVENOUS
  Filled 2016-10-30: qty 2

## 2016-10-30 MED ORDER — PACLITAXEL CHEMO INJECTION 300 MG/50ML
150.0000 mg/m2 | Freq: Once | INTRAVENOUS | Status: AC
  Administered 2016-10-30: 264 mg via INTRAVENOUS
  Filled 2016-10-30: qty 44

## 2016-10-30 MED ORDER — DIPHENHYDRAMINE HCL 50 MG/ML IJ SOLN
50.0000 mg | Freq: Once | INTRAMUSCULAR | Status: AC
  Administered 2016-10-30: 50 mg via INTRAVENOUS

## 2016-10-30 MED ORDER — SODIUM CHLORIDE 0.9% FLUSH
10.0000 mL | INTRAVENOUS | Status: DC | PRN
  Administered 2016-10-30: 10 mL
  Filled 2016-10-30: qty 10

## 2016-10-30 MED ORDER — CARBOPLATIN CHEMO INJECTION 600 MG/60ML
421.0000 mg | Freq: Once | INTRAVENOUS | Status: AC
  Administered 2016-10-30: 420 mg via INTRAVENOUS
  Filled 2016-10-30: qty 42

## 2016-10-30 MED ORDER — FAMOTIDINE IN NACL 20-0.9 MG/50ML-% IV SOLN
INTRAVENOUS | Status: AC
  Filled 2016-10-30: qty 50

## 2016-10-30 NOTE — Telephone Encounter (Signed)
Made additional lab and chemo appointments per 10/30/16 los. Patient still in treatment and to get schedule after finished.

## 2016-10-30 NOTE — Patient Instructions (Signed)
[facility] Discharge Instructions for Patients Receiving Chemotherapy  Today you received the following chemotherapy agents Taxol and Carboplatin. To help prevent nausea and vomiting after your treatment, we encourage you to take your nausea medication as directed.  If you develop nausea and vomiting that is not controlled by your nausea medication, call the clinic.   BELOW ARE SYMPTOMS THAT SHOULD BE REPORTED IMMEDIATELY:  *FEVER GREATER THAN 100.5 F  *CHILLS WITH OR WITHOUT FEVER  NAUSEA AND VOMITING THAT IS NOT CONTROLLED WITH YOUR NAUSEA MEDICATION  *UNUSUAL SHORTNESS OF BREATH  *UNUSUAL BRUISING OR BLEEDING  TENDERNESS IN MOUTH AND THROAT WITH OR WITHOUT PRESENCE OF ULCERS  *URINARY PROBLEMS  *BOWEL PROBLEMS  UNUSUAL RASH Items with * indicate a potential emergency and should be followed up as soon as possible.  Feel free to call the clinic you have any questions or concerns. The clinic phone number is [phone].  Please show the CHEMO ALERT CARD at check-in to the Emergency Department and triage nurse.    

## 2016-10-30 NOTE — Progress Notes (Signed)
[facility] [phone]   [phone]  OFFICE PROGRESS NOTE  [name], MD [address] [place] [address]  DIAGNOSIS: Recurrent non-small cell lung cancer, squamous cell carcinoma initially diagnosed as unresectable a stage IIIa in February 2015.  PRIOR THERAPY: 1) Status post exploratory right VATS with mediastinal biopsy under the care of Dr. Roxan Hockey on 11/13/2013. The tumor was found to be unresectable at that time.[name] 2) Concurrent chemoradiation with weekly carboplatin for AUC of 2 and paclitaxel 45 mg/M2, status post 6 cycles with partial response. First cycle was given on 12/01/2013. 3) Consolidation chemotherapy with carboplatin for AUC of 5 and paclitaxel 175 mg/M2 every 3 weeks with Neulasta support. First dose 02/23/2014. Status post 3 cycles with partial response.  CURRENT THERAPY: Systemic chemotherapy with carboplatin for AUC of 5 and paclitaxel 175 MG/M2 every 3 weeks with Neulasta support. Status post 3 cycles. First dose was given on 08/14/2016.  INTERVAL HISTORY: Kimberly [name] 76 y.o. female came to the clinic today for follow-up visit accompanied by a friend. The patient is currently on systemic chemotherapy with carboplatin and paclitaxel status post 3 cycles. She tolerated the last cycle of her treatment well except for fatigue and weakness for several days after her chemotherapy. She had an episode 2 weeks ago when she was unable to stand up and she was very weak. Her daughter called EMS. She was found to be hypotensive and they gave the patient IV fluid at home. She felt much better at that time. She denied having any chest pain but continues to have shortness breath with exertion and mild cough with no hemoptysis. She denied having any fever or chills. She has no nausea, vomiting, diarrhea or constipation. The patient had repeat CT scan of the chest performed recently and she is here for  evaluation and discussion of her scan results and treatment options.   MEDICAL HISTORY: Past Medical History:  Diagnosis Date  . Allergic asthma without acute exacerbation or status asthmaticus   . Aortic insufficiency   . Arthritis   . Atrial fibrillation ([place])   . Back pain 08/14/2016  . Bronchitis   . Cough    dry, endobronchial mass  . Dyslipidemia   . Family history of adverse reaction to anesthesia    pts son also experiences N/V  . GERD (gastroesophageal reflux disease)   . Heart murmur   . History of blood transfusion   . History of bronchitis   . History of chemotherapy   . History of nuclear stress test 02/26/2006   exercise myoview; normal pattern of perfusion; low risk scan   . Hypertension   . Hypothyroidism   . Mitral insufficiency   . Pneumonia   . PONV (postoperative nausea and vomiting)   . Radiation 12/01/13-01/08/14   50.4 gray to right central chest  . Shortness of breath    with exertion  . Squamous cell carcinoma of lung ([place])   . Syncope    "states she has passed out a few times. dr is trying to find cause.last time when geeting ready to go home after video bronch/bx  . Thyroid disease     ALLERGIES:  is allergic to lactose intolerance (gi); amiodarone; augmentin [amoxicillin-pot clavulanate]; and milk-related compounds.  MEDICATIONS:  Current Outpatient Prescriptions  Medication Sig Dispense Refill  . albuterol (PROVENTIL HFA;VENTOLIN HFA) 108 (90 BASE) MCG/ACT inhaler Inhale 1 puff into the lungs every 6 (six) hours as needed for  wheezing or shortness of breath.     [name] aspirin EC 81 MG tablet Take 81 mg by mouth daily.    [name] atorvastatin (LIPITOR) 20 MG tablet Take 20 mg by mouth every morning.     . chlorthalidone (HYGROTON) 25 MG tablet TAKE 1 TABLET BY MOUTH  DAILY 90 tablet 1  . levothyroxine (SYNTHROID, LEVOTHROID) 150 MCG tablet Take 150 mcg by mouth as directed. Take 1 tablet once every Tuesday, Thursday, Saturday, and Sunday.    .  lidocaine-prilocaine (EMLA) cream Apply 1 application topically as needed. 30 g 0  . meloxicam (MOBIC) 7.5 MG tablet Take 7.5 mg by mouth 2 (two) times daily as needed for pain.    . methocarbamol (ROBAXIN) 500 MG tablet Take 500 mg by mouth every 8 (eight) hours as needed for muscle spasms.    . montelukast (SINGULAIR) 10 MG tablet Take 10 mg by mouth at bedtime.     . Multiple Vitamins-Iron (MULTIVITAMIN/IRON PO) Take 1 tablet by mouth daily.     . ondansetron (ZOFRAN) 8 MG tablet Take 1 tablet (8 mg total) by mouth every 8 (eight) hours as needed for refractory nausea / vomiting (start on Day 3 after each treatment). 20 tablet 0  . polyethylene glycol (MIRALAX / GLYCOLAX) packet Take 17 g by mouth daily.    . prochlorperazine (COMPAZINE) 10 MG tablet Take 1 tablet (10 mg total) by mouth every 6 (six) hours as needed for nausea or vomiting. 30 tablet 1  . diphenoxylate-atropine (LOMOTIL) 2.5-0.025 MG tablet Take 2 tablets by mouth 4 (four) times daily as needed for diarrhea or loose stools. (Patient not taking: Reported on 10/30/2016) 30 tablet 0  . docusate sodium (COLACE) 100 MG capsule Take 100 mg by mouth 2 (two) times daily.    [name] loperamide (IMODIUM) 2 MG capsule Take 2 mg by mouth as needed for diarrhea or loose stools.     No current facility-administered medications for this visit.     SURGICAL HISTORY:  Past Surgical History:  Procedure Laterality Date  . CARDIAC CATHETERIZATION  07/08/2008   normal coronaries  . CARPAL TUNNEL RELEASE Right 1988  . COLONOSCOPY N/A 02/11/2016   Procedure: COLONOSCOPY;  Surgeon: Carol Ada, MD;  Location: [facility];  Service: Endoscopy;  Laterality: N/A;  . DILATION AND CURETTAGE OF UTERUS    . EYE SURGERY Bilateral   . H/O MET Test w/PFT  04/02/2012   low risk; peak VO2 77% predicted  . IR GENERIC HISTORICAL  09/12/2016   IR FLUORO GUIDE PORT INSERTION RIGHT 09/12/2016 [name], MD [facility]  . IR GENERIC HISTORICAL  09/12/2016   IR US  GUIDE VASC ACCESS RIGHT 09/12/2016 [name], MD [facility]  . JOINT REPLACEMENT  2003   thumb rt  . KNEE ARTHROSCOPY  12   rt meniscus  . MEDIASTINOSCOPY N/A 10/30/2013   Procedure: MEDIASTINOSCOPY;  Surgeon: Melrose Nakayama, MD;  Location: [facility];  Service: Thoracic;  Laterality: N/A;  . ROTATOR CUFF REPAIR  2006   ? side  . THYROIDECTOMY  1973  . TRANSTHORACIC ECHOCARDIOGRAM  09/25/2012   EF 55-60%; mild LVH & mild concentric hypertrophy; mild AV regurg; RV systolic pressure increase consistent with mild pulm HTN  . VIDEO ASSISTED THORACOSCOPY (VATS)/ LOBECTOMY Right 11/13/2013   Procedure: VIDEO ASSISTED THORACOSCOPY (VATS) with mediastinal  biopsies;  Surgeon: Melrose Nakayama, MD;  Location: [facility];  Service: Thoracic;  Laterality: Right;  RIGHT VATS,mediastinal biopsies  . VIDEO BRONCHOSCOPY Bilateral  09/25/2013   Procedure: VIDEO BRONCHOSCOPY WITHOUT FLUORO;  Surgeon: Tanda Rockers, MD;  Location: [facility];  Service: Cardiopulmonary;  Laterality: Bilateral;  . VIDEO BRONCHOSCOPY WITH ENDOBRONCHIAL ULTRASOUND N/A 10/30/2013   Procedure: VIDEO BRONCHOSCOPY WITH ENDOBRONCHIAL ULTRASOUND;  Surgeon: Melrose Nakayama, MD;  Location: [facility];  Service: Thoracic;  Laterality: N/A;    REVIEW OF SYSTEMS:  Constitutional: positive for fatigue Eyes: negative Ears, nose, mouth, throat, and face: negative Respiratory: positive for cough and dyspnea on exertion Cardiovascular: negative Gastrointestinal: negative Genitourinary:negative Integument/breast: negative Hematologic/lymphatic: negative Musculoskeletal:negative Neurological: negative Behavioral/Psych: negative Endocrine: negative Allergic/Immunologic: negative   PHYSICAL EXAMINATION: General appearance: alert, cooperative, fatigued and no distress Head: Normocephalic, without obvious abnormality, atraumatic Neck: no adenopathy, no JVD, supple, symmetrical, trachea midline and thyroid not enlarged, symmetric, no  tenderness/mass/nodules Lymph nodes: Cervical, supraclavicular, and axillary nodes normal. Resp: clear to auscultation bilaterally Back: symmetric, no curvature. ROM normal. No CVA tenderness. Cardio: regular rate and rhythm, S1, S2 normal, no murmur, click, rub or gallop GI: soft, non-tender; bowel sounds normal; no masses,  no organomegaly Extremities: extremities normal, atraumatic, no cyanosis or edema Neurologic: Alert and oriented X 3, normal strength and tone. Normal symmetric reflexes. Normal coordination and gait  ECOG PERFORMANCE STATUS: 1 - Symptomatic but completely ambulatory  Blood pressure (!) 160/81, pulse (!) 105, temperature 98.8 F (37.1 C), temperature source Oral, resp. rate 18, height _0  (1.473 m), weight 162 lb 1.6 oz (73.5 kg), SpO2 98 %.  LABORATORY DATA: Lab Results  Component Value Date   WBC 6.6 10/30/2016   HGB 9.2 (L) 10/30/2016   HCT 27.8 (L) 10/30/2016   MCV 100.0 10/30/2016   PLT 170 10/30/2016      Chemistry      Component Value Date/Time   NA 140 10/30/2016 1039   K 4.0 10/30/2016 1039   CL 106 08/22/2016 0552   CO2 28 10/30/2016 1039   BUN 19.8 10/30/2016 1039   CREATININE 0.9 10/30/2016 1039      Component Value Date/Time   CALCIUM 9.4 10/30/2016 1039   ALKPHOS 112 10/30/2016 1039   AST 17 10/30/2016 1039   ALT 14 10/30/2016 1039   BILITOT 0.32 10/30/2016 1039       RADIOGRAPHIC STUDIES: Ct Chest W Contrast  Result Date: 10/27/2016 CLINICAL DATA:  Restaging non-small cell lung cancer (Squamous cell carcinoma) initial diagnosis February 2015. EXAM: CT CHEST, ABDOMEN, AND PELVIS WITH CONTRAST TECHNIQUE: Multidetector CT imaging of the chest, abdomen and pelvis was performed following the standard protocol during bolus administration of intravenous contrast. CONTRAST:  1 ISOVUE-300 IOPAMIDOL (ISOVUE-300) INJECTION 61% COMPARISON:  PET-CT 07/25/2016 and chest CT 07/10/2016. FINDINGS: CT CHEST FINDINGS Chest wall: A right-sided  Port-A-Cath is in good position. No complicating features. No breast masses, supraclavicular or axillary lymphadenopathy. Cardiovascular: The heart is normal in size. No pericardial effusion. The aorta is normal in caliber. No dissection. The branch vessels are patent. Scattered coronary artery calcifications are noted. Mediastinum/Nodes: Persistent soft tissue density surrounding the right mainstem bronchus but overall appears slightly improved. Difficult to measure exactly. The subcarinal soft tissue mass has decreased in size since the prior study. It measures 22 x 12 mm and previously measured 27 x 17 mm. Diffuse esophageal wall thickening in the mid esophageal region likely due to radiation. There is also a stable hiatal hernia. No contralateral hilar adenopathy. Lungs/Pleura: Extensive radiation changes involving the right paramediastinal long hand right middle lobe. No obvious recurrent tumor. There are few small scattered calcified  granulomas but I do not see any findings suspicious for pulmonary metastatic disease. No pleural effusion. Musculoskeletal: No significant bony findings. Degenerative changes involving the spine. CT ABDOMEN PELVIS FINDINGS Hepatobiliary: No worrisome hepatic lesions to suggest metastatic disease. Gallbladder is normal. No common bile duct dilatation. Pancreas: No mass, inflammation or ductal dilatation. Spleen: Normal size.  No focal lesions. Adrenals/Urinary Tract: No adrenal gland metastasis. Both kidneys are unremarkable and stable. Stomach/Bowel: The stomach, duodenum, small bowel and colon are unremarkable. No inflammatory changes, mass lesions or obstructive findings. Vascular/Lymphatic: Moderate aortic calcifications. The branch vessels are normal. The major venous structures are patent. No mesenteric or retroperitoneal mass or adenopathy. Small scattered lymph nodes are stable. Reproductive: The uterus and ovaries are normal. Other: No pelvic mass or adenopathy. No free  pelvic fluid collections. No inguinal mass or adenopathy. No abdominal wall hernia or subcutaneous lesions. Musculoskeletal: Stable large lipoma involving the right anterior thigh. There is also a small lipoma in the left teres minor muscle. No lytic or sclerotic bone lesions to suggest metastatic disease. The L3 spinous process showed increased activity on the recent PET scan but do not see any obvious destructive bony changes. IMPRESSION: 1. Interval decreased tumor surrounding the right mainstem bronchus and in the right subcarinal space. 2. Extensive radiation changes involving the right lung. 3. No findings for pulmonary metastatic disease or abdominal/pelvic metastatic disease. 4. No obvious osseous metastatic lesions. Electronically Signed   By: Marijo Sanes M.D.   On: 10/27/2016 12:20   Ct Abdomen Pelvis W Contrast  Result Date: 10/27/2016 CLINICAL DATA:  Restaging non-small cell lung cancer (Squamous cell carcinoma) initial diagnosis February 2015. EXAM: CT CHEST, ABDOMEN, AND PELVIS WITH CONTRAST TECHNIQUE: Multidetector CT imaging of the chest, abdomen and pelvis was performed following the standard protocol during bolus administration of intravenous contrast. CONTRAST:  1 ISOVUE-300 IOPAMIDOL (ISOVUE-300) INJECTION 61% COMPARISON:  PET-CT 07/25/2016 and chest CT 07/10/2016. FINDINGS: CT CHEST FINDINGS Chest wall: A right-sided Port-A-Cath is in good position. No complicating features. No breast masses, supraclavicular or axillary lymphadenopathy. Cardiovascular: The heart is normal in size. No pericardial effusion. The aorta is normal in caliber. No dissection. The branch vessels are patent. Scattered coronary artery calcifications are noted. Mediastinum/Nodes: Persistent soft tissue density surrounding the right mainstem bronchus but overall appears slightly improved. Difficult to measure exactly. The subcarinal soft tissue mass has decreased in size since the prior study. It measures 22 x 12 mm  and previously measured 27 x 17 mm. Diffuse esophageal wall thickening in the mid esophageal region likely due to radiation. There is also a stable hiatal hernia. No contralateral hilar adenopathy. Lungs/Pleura: Extensive radiation changes involving the right paramediastinal long hand right middle lobe. No obvious recurrent tumor. There are few small scattered calcified granulomas but I do not see any findings suspicious for pulmonary metastatic disease. No pleural effusion. Musculoskeletal: No significant bony findings. Degenerative changes involving the spine. CT ABDOMEN PELVIS FINDINGS Hepatobiliary: No worrisome hepatic lesions to suggest metastatic disease. Gallbladder is normal. No common bile duct dilatation. Pancreas: No mass, inflammation or ductal dilatation. Spleen: Normal size.  No focal lesions. Adrenals/Urinary Tract: No adrenal gland metastasis. Both kidneys are unremarkable and stable. Stomach/Bowel: The stomach, duodenum, small bowel and colon are unremarkable. No inflammatory changes, mass lesions or obstructive findings. Vascular/Lymphatic: Moderate aortic calcifications. The branch vessels are normal. The major venous structures are patent. No mesenteric or retroperitoneal mass or adenopathy. Small scattered lymph nodes are stable. Reproductive: The uterus and ovaries are normal.  Other: No pelvic mass or adenopathy. No free pelvic fluid collections. No inguinal mass or adenopathy. No abdominal wall hernia or subcutaneous lesions. Musculoskeletal: Stable large lipoma involving the right anterior thigh. There is also a small lipoma in the left teres minor muscle. No lytic or sclerotic bone lesions to suggest metastatic disease. The L3 spinous process showed increased activity on the recent PET scan but do not see any obvious destructive bony changes. IMPRESSION: 1. Interval decreased tumor surrounding the right mainstem bronchus and in the right subcarinal space. 2. Extensive radiation changes  involving the right lung. 3. No findings for pulmonary metastatic disease or abdominal/pelvic metastatic disease. 4. No obvious osseous metastatic lesions. Electronically Signed   By: Marijo Sanes M.D.   On: 10/27/2016 12:20    ASSESSMENT AND PLAN: This is a very pleasant 76 years old white female with recurrent non-small cell lung cancer, squamous cell carcinoma. She is status post concurrent chemoradiation followed by consolidation chemotherapy. The patient had recent evidence for disease recurrence and she is currently undergoing systemic chemotherapy with carboplatin for AUC of 5 and paclitaxel 175 MG/M2 every 3 weeks with Neulasta support status post 3 cycles. She is tolerating the treatment well except for fatigue, hypotension as well as neutropenia. She had repeat CT scan of the chest performed recently. I personally and independently reviewed the scan images and discuss the results with the patient and her friend today. The scan showed decrease in the tumor surrounding the right mainstem bronchus and in the right subcarinal space. I recommended for the patient to proceed with 3 more cycles of the same regimen. She is expected to start cycle #4 today. For the dehydration and weakness after chemotherapy, I will arrange for the patient to receive IV hydration with normal saline on 11/02/2013. For hypertension, I strongly recommended for the patient to take her blood pressure medication as prescribed by her family doctor. For the chemotherapy-induced anemia, we will continue to monitor her hemoglobin and hematocrit closely and consider the patient for PRBCs transfusion if her hemoglobin is less than 8.0 g/dL. The patient would come back for follow-up visit in 3 weeks with the start of cycle #5. She was advised to call immediately if she has any concerning symptoms in the interval. The patient voices understanding of current disease status and treatment options and is in agreement with the  current care plan.  All questions were answered. The patient knows to call the clinic with any problems, questions or concerns. We can certainly see the patient much sooner if necessary.  Disclaimer: This note was dictated with voice recognition software. Similar sounding words can inadvertently be transcribed and may not be corrected upon review.

## 2016-11-01 ENCOUNTER — Ambulatory Visit (HOSPITAL_BASED_OUTPATIENT_CLINIC_OR_DEPARTMENT_OTHER): Payer: Medicare Other

## 2016-11-01 VITALS — BP 130/59 | HR 65 | Temp 97.7°F | Resp 20

## 2016-11-01 DIAGNOSIS — Z5189 Encounter for other specified aftercare: Secondary | ICD-10-CM | POA: Diagnosis not present

## 2016-11-01 DIAGNOSIS — C342 Malignant neoplasm of middle lobe, bronchus or lung: Secondary | ICD-10-CM | POA: Diagnosis not present

## 2016-11-01 MED ORDER — PEGFILGRASTIM INJECTION 6 MG/0.6ML ~~LOC~~
6.0000 mg | PREFILLED_SYRINGE | Freq: Once | SUBCUTANEOUS | Status: AC
  Administered 2016-11-01: 6 mg via SUBCUTANEOUS
  Filled 2016-11-01: qty 0.6

## 2016-11-01 NOTE — Patient Instructions (Signed)
Pegfilgrastim injection What is this medicine? PEGFILGRASTIM (PEG fil gra stim) is a long-acting granulocyte colony-stimulating factor that stimulates the growth of neutrophils, a type of white blood cell important in the body's fight against infection. It is used to reduce the incidence of fever and infection in patients with certain types of cancer who are receiving chemotherapy that affects the bone marrow, and to increase survival after being exposed to high doses of radiation. This medicine may be used for other purposes; ask your health care provider or pharmacist if you have questions. COMMON BRAND NAME(S): Neulasta What should I tell my health care provider before I take this medicine? They need to know if you have any of these conditions: -kidney disease -latex allergy -ongoing radiation therapy -sickle cell disease -skin reactions to acrylic adhesives (On-Body Injector only) -an unusual or allergic reaction to pegfilgrastim, filgrastim, other medicines, foods, dyes, or preservatives -pregnant or trying to get pregnant -breast-feeding How should I use this medicine? This medicine is for injection under the skin. If you get this medicine at home, you will be taught how to prepare and give the pre-filled syringe or how to use the On-body Injector. Refer to the patient Instructions for Use for detailed instructions. Use exactly as directed. Take your medicine at regular intervals. Do not take your medicine more often than directed. It is important that you put your used needles and syringes in a special sharps container. Do not put them in a trash can. If you do not have a sharps container, call your pharmacist or healthcare provider to get one. Talk to your pediatrician regarding the use of this medicine in children. While this drug may be prescribed for selected conditions, precautions do apply. Overdosage: If you think you have taken too much of this medicine contact a poison control  center or emergency room at once. NOTE: This medicine is only for you. Do not share this medicine with others. What if I miss a dose? It is important not to miss your dose. Call your doctor or health care professional if you miss your dose. If you miss a dose due to an On-body Injector failure or leakage, a new dose should be administered as soon as possible using a single prefilled syringe for manual use. What may interact with this medicine? Interactions have not been studied. Give your health care provider a list of all the medicines, herbs, non-prescription drugs, or dietary supplements you use. Also tell them if you smoke, drink alcohol, or use illegal drugs. Some items may interact with your medicine. This list may not describe all possible interactions. Give your health care provider a list of all the medicines, herbs, non-prescription drugs, or dietary supplements you use. Also tell them if you smoke, drink alcohol, or use illegal drugs. Some items may interact with your medicine. What should I watch for while using this medicine? You may need blood work done while you are taking this medicine. If you are going to need a MRI, CT scan, or other procedure, tell your doctor that you are using this medicine (On-Body Injector only). What side effects may I notice from receiving this medicine? Side effects that you should report to your doctor or health care professional as soon as possible: -allergic reactions like skin rash, itching or hives, swelling of the face, lips, or tongue -dizziness -fever -pain, redness, or irritation at site where injected -pinpoint red spots on the skin -red or dark-brown urine -shortness of breath or breathing problems -stomach or   side pain, or pain at the shoulder -swelling -tiredness -trouble passing urine or change in the amount of urine Side effects that usually do not require medical attention (report to your doctor or health care professional if they  continue or are bothersome): -bone pain -muscle pain This list may not describe all possible side effects. Call your doctor for medical advice about side effects. You may report side effects to FDA at 1-800-FDA-1088. Where should I keep my medicine? Keep out of the reach of children. Store pre-filled syringes in a refrigerator between 2 and 8 degrees C (36 and 46 degrees F). Do not freeze. Keep in carton to protect from light. Throw away this medicine if it is left out of the refrigerator for more than 48 hours. Throw away any unused medicine after the expiration date. NOTE: This sheet is a summary. It may not cover all possible information. If you have questions about this medicine, talk to your doctor, pharmacist, or health care provider.  2017 Elsevier/Gold Standard (2014-09-10 14:30:14)  

## 2016-11-03 ENCOUNTER — Ambulatory Visit (HOSPITAL_BASED_OUTPATIENT_CLINIC_OR_DEPARTMENT_OTHER): Payer: Medicare Other

## 2016-11-03 VITALS — BP 155/86 | HR 96 | Temp 97.4°F | Resp 16

## 2016-11-03 DIAGNOSIS — E860 Dehydration: Secondary | ICD-10-CM | POA: Diagnosis not present

## 2016-11-03 MED ORDER — SODIUM CHLORIDE 0.9% FLUSH
10.0000 mL | INTRAVENOUS | Status: DC | PRN
  Administered 2016-11-03: 10 mL
  Filled 2016-11-03: qty 10

## 2016-11-03 MED ORDER — SODIUM CHLORIDE 0.9 % IV SOLN
Freq: Once | INTRAVENOUS | Status: AC
  Administered 2016-11-03: 14:00:00 via INTRAVENOUS

## 2016-11-03 MED ORDER — HEPARIN SOD (PORK) LOCK FLUSH 100 UNIT/ML IV SOLN
500.0000 [IU] | Freq: Once | INTRAVENOUS | Status: AC | PRN
  Administered 2016-11-03: 500 [IU]
  Filled 2016-11-03: qty 5

## 2016-11-03 NOTE — Patient Instructions (Signed)
Dehydration, Adult Dehydration is a condition in which there is not enough fluid or water in the body. This happens when you lose more fluids than you take in. Important organs, such as the kidneys, brain, and heart, cannot function without a proper amount of fluids. Any loss of fluids from the body can lead to dehydration. Dehydration can range from mild to severe. This condition should be treated right away to prevent it from becoming severe. What are the causes? This condition may be caused by:  Vomiting.  Diarrhea.  Excessive sweating, such as from heat exposure or exercise.  Not drinking enough fluid, especially:  When ill.  While doing activity that requires a lot of energy.  Excessive urination.  Fever.  Infection.  Certain medicines, such as medicines that cause the body to lose excess fluid (diuretics).  Inability to access safe drinking water.  Reduced physical ability to get adequate water and food. What increases the risk? This condition is more likely to develop in people:  Who have a poorly controlled long-term (chronic) illness, such as diabetes, heart disease, or kidney disease.  Who are age 65 or older.  Who are disabled.  Who live in a place with high altitude.  Who play endurance sports. What are the signs or symptoms? Symptoms of mild dehydration may include:   Thirst.  Dry lips.  Slightly dry mouth.  Dry, warm skin.  Dizziness. Symptoms of moderate dehydration may include:   Very dry mouth.  Muscle cramps.  Dark urine. Urine may be the color of tea.  Decreased urine production.  Decreased tear production.  Heartbeat that is irregular or faster than normal (palpitations).  Headache.  Light-headedness, especially when you stand up from a sitting position.  Fainting (syncope). Symptoms of severe dehydration may include:   Changes in skin, such as:  Cold and clammy skin.  Blotchy (mottled) or pale skin.  Skin that does  not quickly return to normal after being lightly pinched and released (poor skin turgor).  Changes in body fluids, such as:  Extreme thirst.  No tear production.  Inability to sweat when body temperature is high, such as in hot weather.  Very little urine production.  Changes in vital signs, such as:  Weak pulse.  Pulse that is more than 100 beats a minute when sitting still.  Rapid breathing.  Low blood pressure.  Other changes, such as:  Sunken eyes.  Cold hands and feet.  Confusion.  Lack of energy (lethargy).  Difficulty waking up from sleep.  Short-term weight loss.  Unconsciousness. How is this diagnosed? This condition is diagnosed based on your symptoms and a physical exam. Blood and urine tests may be done to help confirm the diagnosis. How is this treated? Treatment for this condition depends on the severity. Mild or moderate dehydration can often be treated at home. Treatment should be started right away. Do not wait until dehydration becomes severe. Severe dehydration is an emergency and it needs to be treated in a hospital. Treatment for mild dehydration may include:   Drinking more fluids.  Replacing salts and minerals in your blood (electrolytes) that you may have lost. Treatment for moderate dehydration may include:   Drinking an oral rehydration solution (ORS). This is a drink that helps you replace fluids and electrolytes (rehydrate). It can be found at pharmacies and retail stores. Treatment for severe dehydration may include:   Receiving fluids through an IV tube.  Receiving an electrolyte solution through a feeding tube that is   passed through your nose and into your stomach (nasogastric tube, or NG tube).  Correcting any abnormalities in electrolytes.  Treating the underlying cause of dehydration. Follow these instructions at home:  If directed by your health care provider, drink an ORS:  Make an ORS by following instructions on the  package.  Start by drinking small amounts, about  cup (120 mL) every 5-10 minutes.  Slowly increase how much you drink until you have taken the amount recommended by your health care provider.  Drink enough clear fluid to keep your urine clear or pale yellow. If you were told to drink an ORS, finish the ORS first, then start slowly drinking other clear fluids. Drink fluids such as:  Water. Do not drink only water. Doing that can lead to having too little salt (sodium) in the body (hyponatremia).  Ice chips.  Fruit juice that you have added water to (diluted fruit juice).  Low-calorie sports drinks.  Avoid:  Alcohol.  Drinks that contain a lot of sugar. These include high-calorie sports drinks, fruit juice that is not diluted, and soda.  Caffeine.  Foods that are greasy or contain a lot of fat or sugar.  Take over-the-counter and prescription medicines only as told by your health care provider.  Do not take sodium tablets. This can lead to having too much sodium in the body (hypernatremia).  Eat foods that contain a healthy balance of electrolytes, such as bananas, oranges, potatoes, tomatoes, and spinach.  Keep all follow-up visits as told by your health care provider. This is important. Contact a health care provider if:  You have abdominal pain that:  Gets worse.  Stays in one area (localizes).  You have a rash.  You have a stiff neck.  You are more irritable than usual.  You are sleepier or more difficult to wake up than usual.  You feel weak or dizzy.  You feel very thirsty.  You have urinated only a small amount of very dark urine over 6-8 hours. Get help right away if:  You have symptoms of severe dehydration.  You cannot drink fluids without vomiting.  Your symptoms get worse with treatment.  You have a fever.  You have a severe headache.  You have vomiting or diarrhea that:  Gets worse.  Does not go away.  You have blood or green matter  (bile) in your vomit.  You have blood in your stool. This may cause stool to look black and tarry.  You have not urinated in 6-8 hours.  You faint.  Your heart rate while sitting still is over 100 beats a minute.  You have trouble breathing. This information is not intended to replace advice given to you by your health care provider. Make sure you discuss any questions you have with your health care provider. Document Released: 08/21/2005 Document Revised: 03/17/2016 Document Reviewed: 10/15/2015 Elsevier Interactive Patient Education  2017 [employer]  

## 2016-11-06 ENCOUNTER — Ambulatory Visit: Payer: Medicare Other

## 2016-11-06 ENCOUNTER — Other Ambulatory Visit: Payer: Medicare Other

## 2016-11-06 ENCOUNTER — Ambulatory Visit: Payer: Medicare Other | Admitting: Internal Medicine

## 2016-11-06 ENCOUNTER — Other Ambulatory Visit (HOSPITAL_BASED_OUTPATIENT_CLINIC_OR_DEPARTMENT_OTHER): Payer: Medicare Other

## 2016-11-06 DIAGNOSIS — C342 Malignant neoplasm of middle lobe, bronchus or lung: Secondary | ICD-10-CM

## 2016-11-06 LAB — CBC WITH DIFFERENTIAL/PLATELET
BASO%: 0.9 % (ref 0.0–2.0)
BASOS ABS: 0 10*3/uL (ref 0.0–0.1)
EOS%: 2.3 % (ref 0.0–7.0)
Eosinophils Absolute: 0.1 10*3/uL (ref 0.0–0.5)
HCT: 26.6 % — ABNORMAL LOW (ref 34.8–46.6)
HEMOGLOBIN: 9.1 g/dL — AB (ref 11.6–15.9)
LYMPH%: 16.3 % (ref 14.0–49.7)
MCH: 34.4 pg — AB (ref 25.1–34.0)
MCHC: 34.2 g/dL (ref 31.5–36.0)
MCV: 100.6 fL (ref 79.5–101.0)
MONO#: 0.9 10*3/uL (ref 0.1–0.9)
MONO%: 16.8 % — AB (ref 0.0–14.0)
NEUT#: 3.5 10*3/uL (ref 1.5–6.5)
NEUT%: 63.7 % (ref 38.4–76.8)
Platelets: 149 10*3/uL (ref 145–400)
RBC: 2.64 10*6/uL — ABNORMAL LOW (ref 3.70–5.45)
RDW: 17.9 % — AB (ref 11.2–14.5)
WBC: 5.5 10*3/uL (ref 3.9–10.3)
lymph#: 0.9 10*3/uL (ref 0.9–3.3)

## 2016-11-06 LAB — COMPREHENSIVE METABOLIC PANEL
ALT: 38 U/L (ref 0–55)
AST: 28 U/L (ref 5–34)
Albumin: 3.7 g/dL (ref 3.5–5.0)
Alkaline Phosphatase: 107 U/L (ref 40–150)
Anion Gap: 9 mEq/L (ref 3–11)
BUN: 22 mg/dL (ref 7.0–26.0)
CHLORIDE: 101 meq/L (ref 98–109)
CO2: 29 mEq/L (ref 22–29)
Calcium: 9.3 mg/dL (ref 8.4–10.4)
Creatinine: 0.9 mg/dL (ref 0.6–1.1)
EGFR: 61 mL/min/{1.73_m2} — ABNORMAL LOW (ref >90)
GLUCOSE: 115 mg/dL (ref 70–140)
POTASSIUM: 4.2 meq/L (ref 3.5–5.1)
SODIUM: 139 meq/L (ref 136–145)
Total Bilirubin: 0.43 mg/dL (ref 0.20–1.20)
Total Protein: 6.5 g/dL (ref 6.4–8.3)

## 2016-11-08 ENCOUNTER — Ambulatory Visit: Payer: Medicare Other

## 2016-11-13 ENCOUNTER — Other Ambulatory Visit: Payer: Medicare Other

## 2016-11-20 ENCOUNTER — Telehealth: Payer: Self-pay | Admitting: Internal Medicine

## 2016-11-20 ENCOUNTER — Ambulatory Visit (HOSPITAL_BASED_OUTPATIENT_CLINIC_OR_DEPARTMENT_OTHER): Payer: Medicare Other

## 2016-11-20 ENCOUNTER — Ambulatory Visit (HOSPITAL_COMMUNITY)
Admission: RE | Admit: 2016-11-20 | Discharge: 2016-11-20 | Disposition: A | Discharge status: Home / Self Care | Payer: Medicare Other | Source: Ambulatory Visit | Attending: Internal Medicine | Admitting: Internal Medicine

## 2016-11-20 ENCOUNTER — Encounter: Payer: Self-pay | Admitting: Internal Medicine

## 2016-11-20 ENCOUNTER — Other Ambulatory Visit (HOSPITAL_BASED_OUTPATIENT_CLINIC_OR_DEPARTMENT_OTHER): Payer: Medicare Other

## 2016-11-20 ENCOUNTER — Ambulatory Visit (HOSPITAL_BASED_OUTPATIENT_CLINIC_OR_DEPARTMENT_OTHER): Payer: Medicare Other | Admitting: Internal Medicine

## 2016-11-20 ENCOUNTER — Other Ambulatory Visit: Payer: Self-pay | Admitting: Medical Oncology

## 2016-11-20 VITALS — BP 148/83 | HR 90 | Temp 98.3°F | Resp 18 | Ht <= 58 in | Wt 159.4 lb

## 2016-11-20 DIAGNOSIS — Z5111 Encounter for antineoplastic chemotherapy: Secondary | ICD-10-CM

## 2016-11-20 DIAGNOSIS — D649 Anemia, unspecified: Secondary | ICD-10-CM

## 2016-11-20 DIAGNOSIS — D701 Agranulocytosis secondary to cancer chemotherapy: Secondary | ICD-10-CM

## 2016-11-20 DIAGNOSIS — T451X5A Adverse effect of antineoplastic and immunosuppressive drugs, initial encounter: Secondary | ICD-10-CM

## 2016-11-20 DIAGNOSIS — C342 Malignant neoplasm of middle lobe, bronchus or lung: Secondary | ICD-10-CM

## 2016-11-20 DIAGNOSIS — C3490 Malignant neoplasm of unspecified part of unspecified bronchus or lung: Secondary | ICD-10-CM | POA: Diagnosis not present

## 2016-11-20 DIAGNOSIS — I10 Essential (primary) hypertension: Secondary | ICD-10-CM | POA: Diagnosis not present

## 2016-11-20 DIAGNOSIS — D6481 Anemia due to antineoplastic chemotherapy: Secondary | ICD-10-CM | POA: Diagnosis not present

## 2016-11-20 LAB — CBC WITH DIFFERENTIAL/PLATELET
BASO%: 0.6 % (ref 0.0–2.0)
BASOS ABS: 0 10*3/uL (ref 0.0–0.1)
EOS ABS: 0 10*3/uL (ref 0.0–0.5)
EOS%: 0.6 % (ref 0.0–7.0)
HCT: 25.9 % — ABNORMAL LOW (ref 34.8–46.6)
HEMOGLOBIN: 8.6 g/dL — AB (ref 11.6–15.9)
LYMPH%: 16.4 % (ref 14.0–49.7)
MCH: 34 pg (ref 25.1–34.0)
MCHC: 33.2 g/dL (ref 31.5–36.0)
MCV: 102.4 fL — AB (ref 79.5–101.0)
MONO#: 0.6 10*3/uL (ref 0.1–0.9)
MONO%: 8.5 % (ref 0.0–14.0)
NEUT#: 5.1 10*3/uL (ref 1.5–6.5)
NEUT%: 73.9 % (ref 38.4–76.8)
Platelets: 108 10*3/uL — ABNORMAL LOW (ref 145–400)
RBC: 2.53 10*6/uL — ABNORMAL LOW (ref 3.70–5.45)
RDW: 18.2 % — AB (ref 11.2–14.5)
WBC: 6.8 10*3/uL (ref 3.9–10.3)
lymph#: 1.1 10*3/uL (ref 0.9–3.3)

## 2016-11-20 LAB — COMPREHENSIVE METABOLIC PANEL
ALBUMIN: 3.7 g/dL (ref 3.5–5.0)
ALK PHOS: 111 U/L (ref 40–150)
ALT: 19 U/L (ref 0–55)
AST: 20 U/L (ref 5–34)
Anion Gap: 12 mEq/L — ABNORMAL HIGH (ref 3–11)
BILIRUBIN TOTAL: 0.57 mg/dL (ref 0.20–1.20)
BUN: 19.1 mg/dL (ref 7.0–26.0)
CALCIUM: 9.5 mg/dL (ref 8.4–10.4)
CO2: 26 mEq/L (ref 22–29)
CREATININE: 0.8 mg/dL (ref 0.6–1.1)
Chloride: 106 mEq/L (ref 98–109)
EGFR: 72 mL/min/{1.73_m2} — AB (ref >90)
GLUCOSE: 91 mg/dL (ref 70–140)
Potassium: 3.7 mEq/L (ref 3.5–5.1)
Sodium: 144 mEq/L (ref 136–145)
Total Protein: 6.6 g/dL (ref 6.4–8.3)

## 2016-11-20 MED ORDER — SODIUM CHLORIDE 0.9% FLUSH
10.0000 mL | INTRAVENOUS | Status: DC | PRN
  Administered 2016-11-20: 10 mL
  Filled 2016-11-20: qty 10

## 2016-11-20 MED ORDER — PACLITAXEL CHEMO INJECTION 300 MG/50ML
150.0000 mg/m2 | Freq: Once | INTRAVENOUS | Status: AC
  Administered 2016-11-20: 264 mg via INTRAVENOUS
  Filled 2016-11-20: qty 44

## 2016-11-20 MED ORDER — SODIUM CHLORIDE 0.9 % IV SOLN
Freq: Once | INTRAVENOUS | Status: AC
  Administered 2016-11-20: 12:00:00 via INTRAVENOUS

## 2016-11-20 MED ORDER — FAMOTIDINE IN NACL 20-0.9 MG/50ML-% IV SOLN
INTRAVENOUS | Status: AC
  Filled 2016-11-20: qty 50

## 2016-11-20 MED ORDER — PALONOSETRON HCL INJECTION 0.25 MG/5ML
0.2500 mg | Freq: Once | INTRAVENOUS | Status: AC
  Administered 2016-11-20: 0.25 mg via INTRAVENOUS

## 2016-11-20 MED ORDER — SODIUM CHLORIDE 0.9 % IV SOLN
20.0000 mg | Freq: Once | INTRAVENOUS | Status: AC
  Administered 2016-11-20: 20 mg via INTRAVENOUS
  Filled 2016-11-20: qty 2

## 2016-11-20 MED ORDER — DIPHENHYDRAMINE HCL 50 MG/ML IJ SOLN
50.0000 mg | Freq: Once | INTRAMUSCULAR | Status: AC
  Administered 2016-11-20: 50 mg via INTRAVENOUS

## 2016-11-20 MED ORDER — PALONOSETRON HCL INJECTION 0.25 MG/5ML
INTRAVENOUS | Status: AC
  Filled 2016-11-20: qty 5

## 2016-11-20 MED ORDER — HEPARIN SOD (PORK) LOCK FLUSH 100 UNIT/ML IV SOLN
500.0000 [IU] | Freq: Once | INTRAVENOUS | Status: AC | PRN
  Administered 2016-11-20: 500 [IU]
  Filled 2016-11-20: qty 5

## 2016-11-20 MED ORDER — FAMOTIDINE IN NACL 20-0.9 MG/50ML-% IV SOLN
20.0000 mg | Freq: Once | INTRAVENOUS | Status: AC
  Administered 2016-11-20: 20 mg via INTRAVENOUS

## 2016-11-20 MED ORDER — SODIUM CHLORIDE 0.9 % IV SOLN
336.8000 mg | Freq: Once | INTRAVENOUS | Status: AC
  Administered 2016-11-20: 340 mg via INTRAVENOUS
  Filled 2016-11-20: qty 34

## 2016-11-20 MED ORDER — CARBOPLATIN CHEMO INTRADERMAL TEST DOSE 100MCG/0.02ML
100.0000 ug | Freq: Once | INTRADERMAL | Status: AC
  Administered 2016-11-20: 100 ug via INTRADERMAL
  Filled 2016-11-20: qty 0.02

## 2016-11-20 MED ORDER — DIPHENHYDRAMINE HCL 50 MG/ML IJ SOLN
INTRAMUSCULAR | Status: AC
  Filled 2016-11-20: qty 1

## 2016-11-20 NOTE — Telephone Encounter (Signed)
Appointments scheduled per 3.19.18 LOS. Patient given AVS report and calendars with future scheduled appointments.

## 2016-11-20 NOTE — Patient Instructions (Signed)
[facility] Discharge Instructions for Patients Receiving Chemotherapy  Today you received the following chemotherapy agents Taxol and Carboplatin. To help prevent nausea and vomiting after your treatment, we encourage you to take your nausea medication as directed.  If you develop nausea and vomiting that is not controlled by your nausea medication, call the clinic.   BELOW ARE SYMPTOMS THAT SHOULD BE REPORTED IMMEDIATELY:  *FEVER GREATER THAN 100.5 F  *CHILLS WITH OR WITHOUT FEVER  NAUSEA AND VOMITING THAT IS NOT CONTROLLED WITH YOUR NAUSEA MEDICATION  *UNUSUAL SHORTNESS OF BREATH  *UNUSUAL BRUISING OR BLEEDING  TENDERNESS IN MOUTH AND THROAT WITH OR WITHOUT PRESENCE OF ULCERS  *URINARY PROBLEMS  *BOWEL PROBLEMS  UNUSUAL RASH Items with * indicate a potential emergency and should be followed up as soon as possible.  Feel free to call the clinic you have any questions or concerns. The clinic phone number is [phone].  Please show the CHEMO ALERT CARD at check-in to the Emergency Department and triage nurse.    

## 2016-11-20 NOTE — Progress Notes (Signed)
[facility] [phone]   [phone]  OFFICE PROGRESS NOTE  Kimberly Pel, MD [address] [name] [address]  DIAGNOSIS: Recurrent non-small cell lung cancer, squamous cell carcinoma initially diagnosed as unresectable a stage IIIa in February 2015.  PRIOR THERAPY: 1) Status post exploratory right VATS with mediastinal biopsy under the care of Dr. Roxan Hockey on 11/13/2013. The tumor was found to be unresectable at that time.Kimberly [name] 2) Concurrent chemoradiation with weekly carboplatin for AUC of 2 and paclitaxel 45 mg/M2, status post 6 cycles with partial response. First cycle was given on 12/01/2013. 3) Consolidation chemotherapy with carboplatin for AUC of 5 and paclitaxel 175 mg/M2 every 3 weeks with Neulasta support. First dose 02/23/2014. Status post 3 cycles with partial response.  CURRENT THERAPY: Systemic chemotherapy with carboplatin for AUC of 5 and paclitaxel 175 MG/M2 every 3 weeks with Neulasta support. Status post 4 cycles. First dose was given on 08/14/2016.  INTERVAL HISTORY: ANALYS Kimberly [name] 76 y.o. female returns to the clinic today for follow-up visit. The patient is currently undergoing systemic chemotherapy with carboplatin and paclitaxel status post 4 cycles. She is tolerating the treatment well except for fatigue secondary to chemotherapy-induced anemia. She denied having any chest pain but has shortness of breath with exertion. She has no cough or hemoptysis. She denied having any nausea, vomiting, diarrhea or constipation. She continues to have mild peripheral neuropathy. She is here today for evaluation before starting cycle #5 of her treatment.   MEDICAL HISTORY: Past Medical History:  Diagnosis Date  . Allergic asthma without acute exacerbation or status asthmaticus   . Aortic insufficiency   . Arthritis   . Atrial fibrillation (Kimberly [name])   . Back pain 08/14/2016  . Bronchitis   . Cough    dry,  endobronchial mass  . Dyslipidemia   . Family history of adverse reaction to anesthesia    pts son also experiences N/V  . GERD (gastroesophageal reflux disease)   . Heart murmur   . History of blood transfusion   . History of bronchitis   . History of chemotherapy   . History of nuclear stress test 02/26/2006   exercise myoview; normal pattern of perfusion; low risk scan   . Hypertension   . Hypothyroidism   . Mitral insufficiency   . Pneumonia   . PONV (postoperative nausea and vomiting)   . Radiation 12/01/13-01/08/14   50.4 gray to right central chest  . Shortness of breath    with exertion  . Squamous cell carcinoma of lung ([place])   . Syncope    "states she has passed out a few times. dr is trying to find cause.last time when geeting ready to go home after video bronch/bx  . Thyroid disease     ALLERGIES:  is allergic to lactose intolerance (gi); amiodarone; augmentin [amoxicillin-pot clavulanate]; and milk-related compounds.  MEDICATIONS:  Current Outpatient Prescriptions  Medication Sig Dispense Refill  . albuterol (PROVENTIL HFA;VENTOLIN HFA) 108 (90 BASE) MCG/ACT inhaler Inhale 1 puff into the lungs every 6 (six) hours as needed for wheezing or shortness of breath.     Kimberly [name] aspirin EC 81 MG tablet Take 81 mg by mouth daily.    Kimberly [name] atorvastatin (LIPITOR) 20 MG tablet Take 20 mg by mouth every morning.     . chlorthalidone (HYGROTON) 25 MG tablet TAKE 1 TABLET BY MOUTH  DAILY 90 tablet 1  . diphenoxylate-atropine (LOMOTIL) 2.5-0.025 MG tablet Take 2 tablets by mouth  4 (four) times daily as needed for diarrhea or loose stools. (Patient not taking: Reported on 10/30/2016) 30 tablet 0  . docusate sodium (COLACE) 100 MG capsule Take 100 mg by mouth 2 (two) times daily.    Kimberly [name] levothyroxine (SYNTHROID, LEVOTHROID) 150 MCG tablet Take 150 mcg by mouth as directed. Take 1 tablet once every Tuesday, Thursday, Saturday, and Sunday.    . lidocaine-prilocaine (EMLA) cream Apply 1 application  topically as needed. 30 g 0  . loperamide (IMODIUM) 2 MG capsule Take 2 mg by mouth as needed for diarrhea or loose stools.    . meloxicam (MOBIC) 7.5 MG tablet Take 7.5 mg by mouth 2 (two) times daily as needed for pain.    . methocarbamol (ROBAXIN) 500 MG tablet Take 500 mg by mouth every 8 (eight) hours as needed for muscle spasms.    . montelukast (SINGULAIR) 10 MG tablet Take 10 mg by mouth at bedtime.     . Multiple Vitamins-Iron (MULTIVITAMIN/IRON PO) Take 1 tablet by mouth daily.     . ondansetron (ZOFRAN) 8 MG tablet Take 1 tablet (8 mg total) by mouth every 8 (eight) hours as needed for refractory nausea / vomiting (start on Day 3 after each treatment). 20 tablet 0  . polyethylene glycol (MIRALAX / GLYCOLAX) packet Take 17 g by mouth daily.    . prochlorperazine (COMPAZINE) 10 MG tablet Take 1 tablet (10 mg total) by mouth every 6 (six) hours as needed for nausea or vomiting. 30 tablet 1   No current facility-administered medications for this visit.     SURGICAL HISTORY:  Past Surgical History:  Procedure Laterality Date  . CARDIAC CATHETERIZATION  07/08/2008   normal coronaries  . CARPAL TUNNEL RELEASE Right 1988  . COLONOSCOPY N/A 02/11/2016   Procedure: COLONOSCOPY;  Surgeon: Carol Ada, MD;  Location: [facility];  Service: Endoscopy;  Laterality: N/A;  . DILATION AND CURETTAGE OF UTERUS    . EYE SURGERY Bilateral   . H/O MET Test w/PFT  04/02/2012   low risk; peak VO2 77% predicted  . IR GENERIC HISTORICAL  09/12/2016   IR FLUORO GUIDE PORT INSERTION RIGHT 09/12/2016 Kimberly Cadet, MD [facility]  . IR GENERIC HISTORICAL  09/12/2016   IR US GUIDE VASC ACCESS RIGHT 09/12/2016 Kimberly Cadet, MD [facility]  . JOINT REPLACEMENT  2003   thumb rt  . KNEE ARTHROSCOPY  12   rt meniscus  . MEDIASTINOSCOPY N/A 10/30/2013   Procedure: MEDIASTINOSCOPY;  Surgeon: Melrose Nakayama, MD;  Location: [facility];  Service: Thoracic;  Laterality: N/A;  . ROTATOR CUFF REPAIR  2006   ?  side  . THYROIDECTOMY  1973  . TRANSTHORACIC ECHOCARDIOGRAM  09/25/2012   EF 55-60%; mild LVH & mild concentric hypertrophy; mild AV regurg; RV systolic pressure increase consistent with mild pulm HTN  . VIDEO ASSISTED THORACOSCOPY (VATS)/ LOBECTOMY Right 11/13/2013   Procedure: VIDEO ASSISTED THORACOSCOPY (VATS) with mediastinal  biopsies;  Surgeon: Melrose Nakayama, MD;  Location: [facility];  Service: Thoracic;  Laterality: Right;  RIGHT VATS,mediastinal biopsies  . VIDEO BRONCHOSCOPY Bilateral 09/25/2013   Procedure: VIDEO BRONCHOSCOPY WITHOUT FLUORO;  Surgeon: Tanda Rockers, MD;  Location: [facility];  Service: Cardiopulmonary;  Laterality: Bilateral;  . VIDEO BRONCHOSCOPY WITH ENDOBRONCHIAL ULTRASOUND N/A 10/30/2013   Procedure: VIDEO BRONCHOSCOPY WITH ENDOBRONCHIAL ULTRASOUND;  Surgeon: Melrose Nakayama, MD;  Location: [facility];  Service: Thoracic;  Laterality: N/A;    REVIEW OF SYSTEMS:  Constitutional: positive for fatigue Eyes:  negative Ears, nose, mouth, throat, and face: negative Respiratory: positive for dyspnea on exertion Cardiovascular: negative Gastrointestinal: negative Genitourinary:negative Integument/breast: negative Hematologic/lymphatic: negative Musculoskeletal:positive for muscle weakness Neurological: negative Behavioral/Psych: negative Endocrine: negative Allergic/Immunologic: negative   PHYSICAL EXAMINATION: General appearance: alert, cooperative, fatigued and no distress Head: Normocephalic, without obvious abnormality, atraumatic Neck: no adenopathy, no JVD, supple, symmetrical, trachea midline and thyroid not enlarged, symmetric, no tenderness/mass/nodules Lymph nodes: Cervical, supraclavicular, and axillary nodes normal. Resp: clear to auscultation bilaterally Back: symmetric, no curvature. ROM normal. No CVA tenderness. Cardio: regular rate and rhythm, S1, S2 normal, no murmur, click, rub or gallop GI: soft, non-tender; bowel sounds normal; no masses,   no organomegaly Extremities: extremities normal, atraumatic, no cyanosis or edema Neurologic: Alert and oriented X 3, normal strength and tone. Normal symmetric reflexes. Normal coordination and gait  ECOG PERFORMANCE STATUS: 1 - Symptomatic but completely ambulatory  Blood pressure (!) 148/83, pulse 90, temperature 98.3 F (36.8 C), temperature source Oral, resp. rate 18, height '4\' 10"'  (1.473 m), weight 159 lb 6.4 oz (72.3 kg), SpO2 100 %.  LABORATORY DATA: Lab Results  Component Value Date   WBC 6.8 11/20/2016   HGB 8.6 (L) 11/20/2016   HCT 25.9 (L) 11/20/2016   MCV 102.4 (H) 11/20/2016   PLT 108 (L) 11/20/2016      Chemistry      Component Value Date/Time   NA 139 11/06/2016 1152   K 4.2 11/06/2016 1152   CL 106 08/22/2016 0552   CO2 29 11/06/2016 1152   BUN 22.0 11/06/2016 1152   CREATININE 0.9 11/06/2016 1152      Component Value Date/Time   CALCIUM 9.3 11/06/2016 1152   ALKPHOS 107 11/06/2016 1152   AST 28 11/06/2016 1152   ALT 38 11/06/2016 1152   BILITOT 0.43 11/06/2016 1152       RADIOGRAPHIC STUDIES: Ct Chest W Contrast  Result Date: 10/27/2016 CLINICAL DATA:  Restaging non-small cell lung cancer (Squamous cell carcinoma) initial diagnosis February 2015. EXAM: CT CHEST, ABDOMEN, AND PELVIS WITH CONTRAST TECHNIQUE: Multidetector CT imaging of the chest, abdomen and pelvis was performed following the standard protocol during bolus administration of intravenous contrast. CONTRAST:  1 ISOVUE-300 IOPAMIDOL (ISOVUE-300) INJECTION 61% COMPARISON:  PET-CT 07/25/2016 and chest CT 07/10/2016. FINDINGS: CT CHEST FINDINGS Chest wall: A right-sided Port-A-Cath is in good position. No complicating features. No breast masses, supraclavicular or axillary lymphadenopathy. Cardiovascular: The heart is normal in size. No pericardial effusion. The aorta is normal in caliber. No dissection. The branch vessels are patent. Scattered coronary artery calcifications are noted.  Mediastinum/Nodes: Persistent soft tissue density surrounding the right mainstem bronchus but overall appears slightly improved. Difficult to measure exactly. The subcarinal soft tissue mass has decreased in size since the prior study. It measures 22 x 12 mm and previously measured 27 x 17 mm. Diffuse esophageal wall thickening in the mid esophageal region likely due to radiation. There is also a stable hiatal hernia. No contralateral hilar adenopathy. Lungs/Pleura: Extensive radiation changes involving the right paramediastinal long hand right middle lobe. No obvious recurrent tumor. There are few small scattered calcified granulomas but I do not see any findings suspicious for pulmonary metastatic disease. No pleural effusion. Musculoskeletal: No significant bony findings. Degenerative changes involving the spine. CT ABDOMEN PELVIS FINDINGS Hepatobiliary: No worrisome hepatic lesions to suggest metastatic disease. Gallbladder is normal. No common bile duct dilatation. Pancreas: No mass, inflammation or ductal dilatation. Spleen: Normal size.  No focal lesions. Adrenals/Urinary Tract: No adrenal gland metastasis.  Both kidneys are unremarkable and stable. Stomach/Bowel: The stomach, duodenum, small bowel and colon are unremarkable. No inflammatory changes, mass lesions or obstructive findings. Vascular/Lymphatic: Moderate aortic calcifications. The branch vessels are normal. The major venous structures are patent. No mesenteric or retroperitoneal mass or adenopathy. Small scattered lymph nodes are stable. Reproductive: The uterus and ovaries are normal. Other: No pelvic mass or adenopathy. No free pelvic fluid collections. No inguinal mass or adenopathy. No abdominal wall hernia or subcutaneous lesions. Musculoskeletal: Stable large lipoma involving the right anterior thigh. There is also a small lipoma in the left teres minor muscle. No lytic or sclerotic bone lesions to suggest metastatic disease. The L3 spinous  process showed increased activity on the recent PET scan but do not see any obvious destructive bony changes. IMPRESSION: 1. Interval decreased tumor surrounding the right mainstem bronchus and in the right subcarinal space. 2. Extensive radiation changes involving the right lung. 3. No findings for pulmonary metastatic disease or abdominal/pelvic metastatic disease. 4. No obvious osseous metastatic lesions. Electronically Signed   By: Marijo Sanes M.D.   On: 10/27/2016 12:20   Ct Abdomen Pelvis W Contrast  Result Date: 10/27/2016 CLINICAL DATA:  Restaging non-small cell lung cancer (Squamous cell carcinoma) initial diagnosis February 2015. EXAM: CT CHEST, ABDOMEN, AND PELVIS WITH CONTRAST TECHNIQUE: Multidetector CT imaging of the chest, abdomen and pelvis was performed following the standard protocol during bolus administration of intravenous contrast. CONTRAST:  1 ISOVUE-300 IOPAMIDOL (ISOVUE-300) INJECTION 61% COMPARISON:  PET-CT 07/25/2016 and chest CT 07/10/2016. FINDINGS: CT CHEST FINDINGS Chest wall: A right-sided Port-A-Cath is in good position. No complicating features. No breast masses, supraclavicular or axillary lymphadenopathy. Cardiovascular: The heart is normal in size. No pericardial effusion. The aorta is normal in caliber. No dissection. The branch vessels are patent. Scattered coronary artery calcifications are noted. Mediastinum/Nodes: Persistent soft tissue density surrounding the right mainstem bronchus but overall appears slightly improved. Difficult to measure exactly. The subcarinal soft tissue mass has decreased in size since the prior study. It measures 22 x 12 mm and previously measured 27 x 17 mm. Diffuse esophageal wall thickening in the mid esophageal region likely due to radiation. There is also a stable hiatal hernia. No contralateral hilar adenopathy. Lungs/Pleura: Extensive radiation changes involving the right paramediastinal long hand right middle lobe. No obvious recurrent  tumor. There are few small scattered calcified granulomas but I do not see any findings suspicious for pulmonary metastatic disease. No pleural effusion. Musculoskeletal: No significant bony findings. Degenerative changes involving the spine. CT ABDOMEN PELVIS FINDINGS Hepatobiliary: No worrisome hepatic lesions to suggest metastatic disease. Gallbladder is normal. No common bile duct dilatation. Pancreas: No mass, inflammation or ductal dilatation. Spleen: Normal size.  No focal lesions. Adrenals/Urinary Tract: No adrenal gland metastasis. Both kidneys are unremarkable and stable. Stomach/Bowel: The stomach, duodenum, small bowel and colon are unremarkable. No inflammatory changes, mass lesions or obstructive findings. Vascular/Lymphatic: Moderate aortic calcifications. The branch vessels are normal. The major venous structures are patent. No mesenteric or retroperitoneal mass or adenopathy. Small scattered lymph nodes are stable. Reproductive: The uterus and ovaries are normal. Other: No pelvic mass or adenopathy. No free pelvic fluid collections. No inguinal mass or adenopathy. No abdominal wall hernia or subcutaneous lesions. Musculoskeletal: Stable large lipoma involving the right anterior thigh. There is also a small lipoma in the left teres minor muscle. No lytic or sclerotic bone lesions to suggest metastatic disease. The L3 spinous process showed increased activity on the recent PET scan but  do not see any obvious destructive bony changes. IMPRESSION: 1. Interval decreased tumor surrounding the right mainstem bronchus and in the right subcarinal space. 2. Extensive radiation changes involving the right lung. 3. No findings for pulmonary metastatic disease or abdominal/pelvic metastatic disease. 4. No obvious osseous metastatic lesions. Electronically Signed   By: Marijo Sanes M.D.   On: 10/27/2016 12:20    ASSESSMENT AND PLAN: This is a very pleasant 75 years old white female with recurrent non-small  cell lung cancer, squamous cell carcinoma. She is currently undergoing systemic chemotherapy with carboplatin and paclitaxel status post 4 cycles. She is tolerating her treatment well except for fatigue. I recommended for the patient to proceed with cycle #5 today but I will reduce the dose of carboplatin to AUC of 4 starting from this cycle secondary to pancytopenia. For the chemotherapy-induced anemia, I will arrange for the patient to receive 2 units of PRBCs transfusion later this week. For hypertension, the patient will continue with her current blood pressure medication. She will come back for follow-up visit in 3 weeks for evaluation before starting cycle #6. The patient was advised to call immediately if she has any concerning symptoms in the interval. The patient voices understanding of current disease status and treatment options and is in agreement with the current care plan.  All questions were answered. The patient knows to call the clinic with any problems, questions or concerns. We can certainly see the patient much sooner if necessary.  Disclaimer: This note was dictated with voice recognition software. Similar sounding words can inadvertently be transcribed and may not be corrected upon review.

## 2016-11-22 ENCOUNTER — Other Ambulatory Visit: Payer: Self-pay | Admitting: Medical Oncology

## 2016-11-22 ENCOUNTER — Ambulatory Visit: Payer: Medicare Other

## 2016-11-22 ENCOUNTER — Other Ambulatory Visit: Payer: Medicare Other

## 2016-11-22 ENCOUNTER — Ambulatory Visit (HOSPITAL_BASED_OUTPATIENT_CLINIC_OR_DEPARTMENT_OTHER): Payer: Medicare Other

## 2016-11-22 VITALS — BP 121/81 | HR 95 | Temp 98.5°F | Resp 18

## 2016-11-22 DIAGNOSIS — Z5189 Encounter for other specified aftercare: Secondary | ICD-10-CM

## 2016-11-22 DIAGNOSIS — C342 Malignant neoplasm of middle lobe, bronchus or lung: Secondary | ICD-10-CM

## 2016-11-22 DIAGNOSIS — D649 Anemia, unspecified: Secondary | ICD-10-CM | POA: Diagnosis not present

## 2016-11-22 DIAGNOSIS — E860 Dehydration: Secondary | ICD-10-CM

## 2016-11-22 MED ORDER — PROCHLORPERAZINE MALEATE 10 MG PO TABS: 10.0000 mg | ORAL_TABLET | Freq: Four times a day (QID) | ORAL | 1 refills | Status: DC | PRN

## 2016-11-22 MED ORDER — PEGFILGRASTIM INJECTION 6 MG/0.6ML ~~LOC~~
6.0000 mg | PREFILLED_SYRINGE | Freq: Once | SUBCUTANEOUS | Status: AC
  Administered 2016-11-22: 6 mg via SUBCUTANEOUS
  Filled 2016-11-22: qty 0.6

## 2016-11-22 MED ORDER — HEPARIN SOD (PORK) LOCK FLUSH 100 UNIT/ML IV SOLN
250.0000 [IU] | Freq: Once | INTRAVENOUS | Status: AC | PRN
  Administered 2016-11-22: 500 [IU]
  Filled 2016-11-22: qty 5

## 2016-11-22 MED ORDER — SODIUM CHLORIDE 0.9% FLUSH
10.0000 mL | INTRAVENOUS | Status: DC | PRN
  Administered 2016-11-22: 10 mL
  Filled 2016-11-22: qty 10

## 2016-11-22 NOTE — Patient Instructions (Signed)
Pegfilgrastim injection What is this medicine? PEGFILGRASTIM (PEG fil gra stim) is a long-acting granulocyte colony-stimulating factor that stimulates the growth of neutrophils, a type of white blood cell important in the body's fight against infection. It is used to reduce the incidence of fever and infection in patients with certain types of cancer who are receiving chemotherapy that affects the bone marrow, and to increase survival after being exposed to high doses of radiation. This medicine may be used for other purposes; ask your health care provider or pharmacist if you have questions. COMMON BRAND NAME(S): Neulasta What should I tell my health care provider before I take this medicine? They need to know if you have any of these conditions: -kidney disease -latex allergy -ongoing radiation therapy -sickle cell disease -skin reactions to acrylic adhesives (On-Body Injector only) -an unusual or allergic reaction to pegfilgrastim, filgrastim, other medicines, foods, dyes, or preservatives -pregnant or trying to get pregnant -breast-feeding How should I use this medicine? This medicine is for injection under the skin. If you get this medicine at home, you will be taught how to prepare and give the pre-filled syringe or how to use the On-body Injector. Refer to the patient Instructions for Use for detailed instructions. Use exactly as directed. Take your medicine at regular intervals. Do not take your medicine more often than directed. It is important that you put your used needles and syringes in a special sharps container. Do not put them in a trash can. If you do not have a sharps container, call your pharmacist or healthcare provider to get one. Talk to your pediatrician regarding the use of this medicine in children. While this drug may be prescribed for selected conditions, precautions do apply. Overdosage: If you think you have taken too much of this medicine contact a poison control  center or emergency room at once. NOTE: This medicine is only for you. Do not share this medicine with others. What if I miss a dose? It is important not to miss your dose. Call your doctor or health care professional if you miss your dose. If you miss a dose due to an On-body Injector failure or leakage, a new dose should be administered as soon as possible using a single prefilled syringe for manual use. What may interact with this medicine? Interactions have not been studied. Give your health care provider a list of all the medicines, herbs, non-prescription drugs, or dietary supplements you use. Also tell them if you smoke, drink alcohol, or use illegal drugs. Some items may interact with your medicine. This list may not describe all possible interactions. Give your health care provider a list of all the medicines, herbs, non-prescription drugs, or dietary supplements you use. Also tell them if you smoke, drink alcohol, or use illegal drugs. Some items may interact with your medicine. What should I watch for while using this medicine? You may need blood work done while you are taking this medicine. If you are going to need a MRI, CT scan, or other procedure, tell your doctor that you are using this medicine (On-Body Injector only). What side effects may I notice from receiving this medicine? Side effects that you should report to your doctor or health care professional as soon as possible: -allergic reactions like skin rash, itching or hives, swelling of the face, lips, or tongue -dizziness -fever -pain, redness, or irritation at site where injected -pinpoint red spots on the skin -red or dark-brown urine -shortness of breath or breathing problems -stomach or   side pain, or pain at the shoulder -swelling -tiredness -trouble passing urine or change in the amount of urine Side effects that usually do not require medical attention (report to your doctor or health care professional if they  continue or are bothersome): -bone pain -muscle pain This list may not describe all possible side effects. Call your doctor for medical advice about side effects. You may report side effects to FDA at 1-800-FDA-1088. Where should I keep my medicine? Keep out of the reach of children. Store pre-filled syringes in a refrigerator between 2 and 8 degrees C (36 and 46 degrees F). Do not freeze. Keep in carton to protect from light. Throw away this medicine if it is left out of the refrigerator for more than 48 hours. Throw away any unused medicine after the expiration date. NOTE: This sheet is a summary. It may not cover all possible information. If you have questions about this medicine, talk to your doctor, pharmacist, or health care provider.  2017 Elsevier/Gold Standard (2014-09-10 14:30:14)  

## 2016-11-24 ENCOUNTER — Ambulatory Visit (HOSPITAL_BASED_OUTPATIENT_CLINIC_OR_DEPARTMENT_OTHER): Payer: Medicare Other

## 2016-11-24 DIAGNOSIS — D649 Anemia, unspecified: Secondary | ICD-10-CM

## 2016-11-24 LAB — PREPARE RBC (CROSSMATCH)

## 2016-11-24 MED ORDER — SODIUM CHLORIDE 0.9% FLUSH
10.0000 mL | INTRAVENOUS | Status: AC | PRN
Start: 2016-11-24 — End: 2016-11-24
  Administered 2016-11-24: 10 mL
  Filled 2016-11-24: qty 10

## 2016-11-24 MED ORDER — ACETAMINOPHEN 325 MG PO TABS
ORAL_TABLET | ORAL | Status: AC
  Filled 2016-11-24: qty 2

## 2016-11-24 MED ORDER — ACETAMINOPHEN 325 MG PO TABS
650.0000 mg | ORAL_TABLET | Freq: Once | ORAL | Status: AC
  Administered 2016-11-24: 650 mg via ORAL

## 2016-11-24 MED ORDER — SODIUM CHLORIDE 0.9 % IV SOLN
250.0000 mL | Freq: Once | INTRAVENOUS | Status: AC
  Administered 2016-11-24: 250 mL via INTRAVENOUS

## 2016-11-24 MED ORDER — DIPHENHYDRAMINE HCL 25 MG PO CAPS
ORAL_CAPSULE | ORAL | Status: AC
  Filled 2016-11-24: qty 1

## 2016-11-24 MED ORDER — HEPARIN SOD (PORK) LOCK FLUSH 100 UNIT/ML IV SOLN
500.0000 [IU] | Freq: Every day | INTRAVENOUS | Status: AC | PRN
  Administered 2016-11-24: 500 [IU]
  Filled 2016-11-24: qty 5

## 2016-11-24 MED ORDER — DIPHENHYDRAMINE HCL 25 MG PO CAPS
25.0000 mg | ORAL_CAPSULE | Freq: Once | ORAL | Status: AC
Start: 2016-11-24 — End: 2016-11-24
  Administered 2016-11-24: 25 mg via ORAL

## 2016-11-24 NOTE — Patient Instructions (Signed)
Blood Transfusion, Adult, Care After This sheet gives you information about how to care for yourself after your procedure. Your health care provider may also give you more specific instructions. If you have problems or questions, contact your health care provider. What can I expect after the procedure? After your procedure, it is common to have:  Bruising and soreness where the IV tube was inserted.  Headache. Follow these instructions at home:  Take over-the-counter and prescription medicines only as told by your health care provider.  Return to your normal activities as told by your health care provider.  Follow instructions from your health care provider about how to take care of your IV insertion site. Make sure you:  Wash your hands with soap and water before you change your bandage (dressing). If soap and water are not available, use hand sanitizer.  Change your dressing as told by your health care provider.  Check your IV insertion site every day for signs of infection. Check for:  More redness, swelling, or pain.  More fluid or blood.  Warmth.  Pus or a bad smell. Contact a health care provider if:  You have more redness, swelling, or pain around the IV insertion site.  You have more fluid or blood coming from the IV insertion site.  Your IV insertion site feels warm to the touch.  You have pus or a bad smell coming from the IV insertion site.  Your urine turns pink, red, or brown.  You feel weak after doing your normal activities. Get help right away if:  You have signs of a serious allergic or immune system reaction, including:  Itchiness.  Hives.  Trouble breathing.  Anxiety.  Chest or lower back pain.  Fever, flushing, and chills.  Rapid pulse.  Rash.  Diarrhea.  Vomiting.  Dark urine.  Serious headache.  Dizziness.  Stiff neck.  Yellow coloration of the face or the white parts of the eyes (jaundice). This information is not  intended to replace advice given to you by your health care provider. Make sure you discuss any questions you have with your health care provider. Document Released: 09/11/2014 Document Revised: 04/19/2016 Document Reviewed: 03/06/2016 [employer] Interactive Patient Education  2017 [employer]  

## 2016-11-27 ENCOUNTER — Other Ambulatory Visit (HOSPITAL_BASED_OUTPATIENT_CLINIC_OR_DEPARTMENT_OTHER): Payer: Medicare Other

## 2016-11-27 DIAGNOSIS — C342 Malignant neoplasm of middle lobe, bronchus or lung: Secondary | ICD-10-CM

## 2016-11-27 LAB — CBC WITH DIFFERENTIAL/PLATELET
BASO%: 0.5 % (ref 0.0–2.0)
Basophils Absolute: 0 10*3/uL (ref 0.0–0.1)
EOS%: 4.4 % (ref 0.0–7.0)
Eosinophils Absolute: 0.2 10*3/uL (ref 0.0–0.5)
HEMATOCRIT: 35 % (ref 34.8–46.6)
HGB: 12 g/dL (ref 11.6–15.9)
LYMPH#: 1 10*3/uL (ref 0.9–3.3)
LYMPH%: 25.6 % (ref 14.0–49.7)
MCH: 32.7 pg (ref 25.1–34.0)
MCHC: 34.3 g/dL (ref 31.5–36.0)
MCV: 95.4 fL (ref 79.5–101.0)
MONO#: 0.4 10*3/uL (ref 0.1–0.9)
MONO%: 9 % (ref 0.0–14.0)
NEUT#: 2.3 10*3/uL (ref 1.5–6.5)
NEUT%: 60.5 % (ref 38.4–76.8)
Platelets: 38 10*3/uL — ABNORMAL LOW (ref 145–400)
RBC: 3.67 10*6/uL — ABNORMAL LOW (ref 3.70–5.45)
RDW: 18.3 % — ABNORMAL HIGH (ref 11.2–14.5)
WBC: 3.9 10*3/uL (ref 3.9–10.3)

## 2016-11-27 LAB — COMPREHENSIVE METABOLIC PANEL
ALBUMIN: 3.7 g/dL (ref 3.5–5.0)
ALK PHOS: 107 U/L (ref 40–150)
ALT: 24 U/L (ref 0–55)
ANION GAP: 8 meq/L (ref 3–11)
AST: 22 U/L (ref 5–34)
BUN: 19.4 mg/dL (ref 7.0–26.0)
CALCIUM: 9.5 mg/dL (ref 8.4–10.4)
CHLORIDE: 102 meq/L (ref 98–109)
CO2: 29 mEq/L (ref 22–29)
Creatinine: 0.9 mg/dL (ref 0.6–1.1)
EGFR: 65 mL/min/{1.73_m2} — AB (ref >90)
Glucose: 125 mg/dl (ref 70–140)
POTASSIUM: 4.2 meq/L (ref 3.5–5.1)
Sodium: 140 mEq/L (ref 136–145)
Total Bilirubin: 0.75 mg/dL (ref 0.20–1.20)
Total Protein: 6.5 g/dL (ref 6.4–8.3)

## 2016-11-27 LAB — TYPE AND SCREEN
ABO/RH(D): O POS
Antibody Screen: NEGATIVE
Unit division: 0
Unit division: 0

## 2016-11-27 LAB — BPAM RBC
BLOOD PRODUCT EXPIRATION DATE: 201804172359
Blood Product Expiration Date: 201804172359
ISSUE DATE / TIME: 201803231209
ISSUE DATE / TIME: 201803231209
UNIT TYPE AND RH: 5100
Unit Type and Rh: 5100

## 2016-12-04 ENCOUNTER — Other Ambulatory Visit (HOSPITAL_BASED_OUTPATIENT_CLINIC_OR_DEPARTMENT_OTHER): Payer: Medicare Other

## 2016-12-04 DIAGNOSIS — C342 Malignant neoplasm of middle lobe, bronchus or lung: Secondary | ICD-10-CM | POA: Diagnosis not present

## 2016-12-04 LAB — CBC WITH DIFFERENTIAL/PLATELET
BASO%: 0.7 % (ref 0.0–2.0)
Basophils Absolute: 0.1 10*3/uL (ref 0.0–0.1)
EOS ABS: 0.1 10*3/uL (ref 0.0–0.5)
EOS%: 1 % (ref 0.0–7.0)
HCT: 32.7 % — ABNORMAL LOW (ref 34.8–46.6)
HGB: 11.2 g/dL — ABNORMAL LOW (ref 11.6–15.9)
LYMPH%: 16.4 % (ref 14.0–49.7)
MCH: 32.7 pg (ref 25.1–34.0)
MCHC: 34.2 g/dL (ref 31.5–36.0)
MCV: 95.6 fL (ref 79.5–101.0)
MONO#: 0.6 10*3/uL (ref 0.1–0.9)
MONO%: 7.1 % (ref 0.0–14.0)
NEUT%: 74.8 % (ref 38.4–76.8)
NEUTROS ABS: 6.2 10*3/uL (ref 1.5–6.5)
Platelets: 83 10*3/uL — ABNORMAL LOW (ref 145–400)
RBC: 3.42 10*6/uL — AB (ref 3.70–5.45)
RDW: 19.8 % — ABNORMAL HIGH (ref 11.2–14.5)
WBC: 8.3 10*3/uL (ref 3.9–10.3)
lymph#: 1.4 10*3/uL (ref 0.9–3.3)

## 2016-12-04 LAB — COMPREHENSIVE METABOLIC PANEL
ALT: 22 U/L (ref 0–55)
AST: 21 U/L (ref 5–34)
Albumin: 3.6 g/dL (ref 3.5–5.0)
Alkaline Phosphatase: 111 U/L (ref 40–150)
Anion Gap: 6 mEq/L (ref 3–11)
BILIRUBIN TOTAL: 0.47 mg/dL (ref 0.20–1.20)
BUN: 16.6 mg/dL (ref 7.0–26.0)
CHLORIDE: 106 meq/L (ref 98–109)
CO2: 30 meq/L — AB (ref 22–29)
Calcium: 9.4 mg/dL (ref 8.4–10.4)
Creatinine: 0.8 mg/dL (ref 0.6–1.1)
EGFR: 75 mL/min/{1.73_m2} — AB (ref >90)
GLUCOSE: 83 mg/dL (ref 70–140)
Potassium: 4 mEq/L (ref 3.5–5.1)
SODIUM: 143 meq/L (ref 136–145)
TOTAL PROTEIN: 6.2 g/dL — AB (ref 6.4–8.3)

## 2016-12-11 ENCOUNTER — Encounter: Payer: Self-pay | Admitting: Internal Medicine

## 2016-12-11 ENCOUNTER — Telehealth: Payer: Self-pay | Admitting: Internal Medicine

## 2016-12-11 ENCOUNTER — Other Ambulatory Visit (HOSPITAL_BASED_OUTPATIENT_CLINIC_OR_DEPARTMENT_OTHER): Payer: Medicare Other

## 2016-12-11 ENCOUNTER — Ambulatory Visit (HOSPITAL_BASED_OUTPATIENT_CLINIC_OR_DEPARTMENT_OTHER): Payer: Medicare Other | Admitting: Internal Medicine

## 2016-12-11 ENCOUNTER — Ambulatory Visit (HOSPITAL_BASED_OUTPATIENT_CLINIC_OR_DEPARTMENT_OTHER): Payer: Medicare Other

## 2016-12-11 VITALS — BP 161/63 | HR 83 | Temp 98.3°F | Resp 17 | Ht <= 58 in | Wt 160.5 lb

## 2016-12-11 DIAGNOSIS — Z5111 Encounter for antineoplastic chemotherapy: Secondary | ICD-10-CM

## 2016-12-11 DIAGNOSIS — C342 Malignant neoplasm of middle lobe, bronchus or lung: Secondary | ICD-10-CM

## 2016-12-11 DIAGNOSIS — R530 Neoplastic (malignant) related fatigue: Secondary | ICD-10-CM

## 2016-12-11 DIAGNOSIS — C3490 Malignant neoplasm of unspecified part of unspecified bronchus or lung: Secondary | ICD-10-CM

## 2016-12-11 DIAGNOSIS — D649 Anemia, unspecified: Secondary | ICD-10-CM

## 2016-12-11 LAB — CBC WITH DIFFERENTIAL/PLATELET
BASO%: 0.4 % (ref 0.0–2.0)
Basophils Absolute: 0 10*3/uL (ref 0.0–0.1)
EOS ABS: 0.1 10*3/uL (ref 0.0–0.5)
EOS%: 0.7 % (ref 0.0–7.0)
HCT: 30.1 % — ABNORMAL LOW (ref 34.8–46.6)
HEMOGLOBIN: 10.2 g/dL — AB (ref 11.6–15.9)
LYMPH%: 18.2 % (ref 14.0–49.7)
MCH: 32.6 pg (ref 25.1–34.0)
MCHC: 33.9 g/dL (ref 31.5–36.0)
MCV: 96.2 fL (ref 79.5–101.0)
MONO#: 0.5 10*3/uL (ref 0.1–0.9)
MONO%: 7.9 % (ref 0.0–14.0)
NEUT%: 72.8 % (ref 38.4–76.8)
NEUTROS ABS: 4.9 10*3/uL (ref 1.5–6.5)
PLATELETS: 112 10*3/uL — AB (ref 145–400)
RBC: 3.13 10*6/uL — ABNORMAL LOW (ref 3.70–5.45)
RDW: 18.6 % — AB (ref 11.2–14.5)
WBC: 6.8 10*3/uL (ref 3.9–10.3)
lymph#: 1.2 10*3/uL (ref 0.9–3.3)

## 2016-12-11 LAB — COMPREHENSIVE METABOLIC PANEL WITH GFR
ALT: 16 U/L (ref 0–55)
AST: 19 U/L (ref 5–34)
Albumin: 3.5 g/dL (ref 3.5–5.0)
Alkaline Phosphatase: 107 U/L (ref 40–150)
Anion Gap: 10 meq/L (ref 3–11)
BUN: 18.9 mg/dL (ref 7.0–26.0)
CO2: 27 meq/L (ref 22–29)
Calcium: 9.5 mg/dL (ref 8.4–10.4)
Chloride: 106 meq/L (ref 98–109)
Creatinine: 0.8 mg/dL (ref 0.6–1.1)
EGFR: 75 mL/min/{1.73_m2} — ABNORMAL LOW
Glucose: 83 mg/dL (ref 70–140)
Potassium: 4.4 meq/L (ref 3.5–5.1)
Sodium: 143 meq/L (ref 136–145)
Total Bilirubin: 0.69 mg/dL (ref 0.20–1.20)
Total Protein: 6.3 g/dL — ABNORMAL LOW (ref 6.4–8.3)

## 2016-12-11 MED ORDER — CARBOPLATIN CHEMO INTRADERMAL TEST DOSE 100MCG/0.02ML
100.0000 ug | Freq: Once | INTRADERMAL | Status: AC
  Administered 2016-12-11: 100 ug via INTRADERMAL
  Filled 2016-12-11: qty 0.02

## 2016-12-11 MED ORDER — FAMOTIDINE IN NACL 20-0.9 MG/50ML-% IV SOLN
20.0000 mg | Freq: Once | INTRAVENOUS | Status: AC
  Administered 2016-12-11: 20 mg via INTRAVENOUS

## 2016-12-11 MED ORDER — PALONOSETRON HCL INJECTION 0.25 MG/5ML
INTRAVENOUS | Status: AC
  Filled 2016-12-11: qty 5

## 2016-12-11 MED ORDER — SODIUM CHLORIDE 0.9% FLUSH
10.0000 mL | INTRAVENOUS | Status: DC | PRN
  Administered 2016-12-11: 10 mL
  Filled 2016-12-11: qty 10

## 2016-12-11 MED ORDER — HEPARIN SOD (PORK) LOCK FLUSH 100 UNIT/ML IV SOLN
500.0000 [IU] | Freq: Once | INTRAVENOUS | Status: AC | PRN
  Administered 2016-12-11: 500 [IU]
  Filled 2016-12-11: qty 5

## 2016-12-11 MED ORDER — PALONOSETRON HCL INJECTION 0.25 MG/5ML
0.2500 mg | Freq: Once | INTRAVENOUS | Status: AC
  Administered 2016-12-11: 0.25 mg via INTRAVENOUS

## 2016-12-11 MED ORDER — SODIUM CHLORIDE 0.9 % IV SOLN
Freq: Once | INTRAVENOUS | Status: AC
  Administered 2016-12-11: 13:00:00 via INTRAVENOUS

## 2016-12-11 MED ORDER — CARBOPLATIN CHEMO INJECTION 450 MG/45ML
340.0000 mg | Freq: Once | INTRAVENOUS | Status: AC
  Administered 2016-12-11: 340 mg via INTRAVENOUS
  Filled 2016-12-11: qty 34

## 2016-12-11 MED ORDER — FAMOTIDINE IN NACL 20-0.9 MG/50ML-% IV SOLN
INTRAVENOUS | Status: AC
  Filled 2016-12-11: qty 50

## 2016-12-11 MED ORDER — PACLITAXEL CHEMO INJECTION 300 MG/50ML
150.0000 mg/m2 | Freq: Once | INTRAVENOUS | Status: AC
  Administered 2016-12-11: 264 mg via INTRAVENOUS
  Filled 2016-12-11: qty 44

## 2016-12-11 MED ORDER — DIPHENHYDRAMINE HCL 50 MG/ML IJ SOLN
50.0000 mg | Freq: Once | INTRAMUSCULAR | Status: AC
  Administered 2016-12-11: 25 mg via INTRAVENOUS

## 2016-12-11 MED ORDER — DIPHENHYDRAMINE HCL 50 MG/ML IJ SOLN
INTRAMUSCULAR | Status: AC
  Filled 2016-12-11: qty 1

## 2016-12-11 MED ORDER — DEXAMETHASONE SODIUM PHOSPHATE 100 MG/10ML IJ SOLN
20.0000 mg | Freq: Once | INTRAMUSCULAR | Status: AC
  Administered 2016-12-11: 20 mg via INTRAVENOUS
  Filled 2016-12-11: qty 2

## 2016-12-11 NOTE — Progress Notes (Signed)
[place] [phone]   [phone]  OFFICE PROGRESS NOTE  [name], MD [address] [place] [address]  DIAGNOSIS: Recurrent non-small cell lung cancer, squamous cell carcinoma initially diagnosed as unresectable a stage IIIa in February 2015.  PRIOR THERAPY: 1) Status post exploratory right VATS with mediastinal biopsy under the care of Dr. Roxan Hockey on 11/13/2013. The tumor was found to be unresectable at that time.[name] 2) Concurrent chemoradiation with weekly carboplatin for AUC of 2 and paclitaxel 45 mg/M2, status post 6 cycles with partial response. First cycle was given on 12/01/2013. 3) Consolidation chemotherapy with carboplatin for AUC of 5 and paclitaxel 175 mg/M2 every 3 weeks with Neulasta support. First dose 02/23/2014. Status post 3 cycles with partial response.  CURRENT THERAPY: Systemic chemotherapy with carboplatin for AUC of 5 and paclitaxel 175 MG/M2 every 3 weeks with Neulasta support. Status post 5 cycles. First dose was given on 08/14/2016.  INTERVAL HISTORY: Kimberly [name] 76 y.o. female returns to the clinic today for follow-up visit. The patient is feeling fine except for fatigue. She tolerated the last cycle of her treatment well except for mild peripheral neuropathy. Her shortness of breath and cough is much better. She denied having any chest pain or hemoptysis. She has no recent weight loss or night sweats. She denied having any fever or chills. She has no nausea, vomiting, diarrhea or constipation. She is here today for evaluation before starting cycle #6 of her treatment.  MEDICAL HISTORY: Past Medical History:  Diagnosis Date  . Allergic asthma without acute exacerbation or status asthmaticus   . Aortic insufficiency   . Arthritis   . Atrial fibrillation ([place])   . Back pain 08/14/2016  . Bronchitis   . Cough    dry, endobronchial mass  . Dyslipidemia   . Family history of  adverse reaction to anesthesia    pts son also experiences N/V  . GERD (gastroesophageal reflux disease)   . Heart murmur   . History of blood transfusion   . History of bronchitis   . History of chemotherapy   . History of nuclear stress test 02/26/2006   exercise myoview; normal pattern of perfusion; low risk scan   . Hypertension   . Hypothyroidism   . Mitral insufficiency   . Pneumonia   . PONV (postoperative nausea and vomiting)   . Radiation 12/01/13-01/08/14   50.4 gray to right central chest  . Shortness of breath    with exertion  . Squamous cell carcinoma of lung ([place])   . Syncope    "states she has passed out a few times. dr is trying to find cause.last time when geeting ready to go home after video bronch/bx  . Thyroid disease     ALLERGIES:  is allergic to lactose intolerance (gi); amiodarone; augmentin [amoxicillin-pot clavulanate]; and milk-related compounds.  MEDICATIONS:  Current Outpatient Prescriptions  Medication Sig Dispense Refill  . albuterol (PROVENTIL HFA;VENTOLIN HFA) 108 (90 BASE) MCG/ACT inhaler Inhale 1 puff into the lungs every 6 (six) hours as needed for wheezing or shortness of breath.     [name] aspirin EC 81 MG tablet Take 81 mg by mouth daily.    [name] atorvastatin (LIPITOR) 20 MG tablet Take 20 mg by mouth every morning.     . chlorthalidone (HYGROTON) 25 MG tablet TAKE 1 TABLET BY MOUTH  DAILY 90 tablet 1  . diphenoxylate-atropine (LOMOTIL) 2.5-0.025 MG tablet Take 2 tablets by mouth 4 (  four) times daily as needed for diarrhea or loose stools. (Patient not taking: Reported on 10/30/2016) 30 tablet 0  . docusate sodium (COLACE) 100 MG capsule Take 100 mg by mouth 2 (two) times daily.    [name] levothyroxine (SYNTHROID, LEVOTHROID) 150 MCG tablet Take 150 mcg by mouth as directed. Take 1 tablet once every Tuesday, Thursday, Saturday, and Sunday.    . lidocaine-prilocaine (EMLA) cream Apply 1 application topically as needed. 30 g 0  . loperamide (IMODIUM) 2 MG  capsule Take 2 mg by mouth as needed for diarrhea or loose stools.    . meloxicam (MOBIC) 7.5 MG tablet Take 7.5 mg by mouth 2 (two) times daily as needed for pain.    . methocarbamol (ROBAXIN) 500 MG tablet Take 500 mg by mouth every 8 (eight) hours as needed for muscle spasms.    . metoprolol succinate (TOPROL-XL) 25 MG 24 hr tablet Take 25 mg by mouth daily. Take 1/2 tablet daily    . montelukast (SINGULAIR) 10 MG tablet Take 10 mg by mouth at bedtime.     . Multiple Vitamins-Iron (MULTIVITAMIN/IRON PO) Take 1 tablet by mouth daily.     . ondansetron (ZOFRAN) 8 MG tablet Take 1 tablet (8 mg total) by mouth every 8 (eight) hours as needed for refractory nausea / vomiting (start on Day 3 after each treatment). (Patient not taking: Reported on 11/20/2016) 20 tablet 0  . polyethylene glycol (MIRALAX / GLYCOLAX) packet Take 17 g by mouth daily.    . prochlorperazine (COMPAZINE) 10 MG tablet Take 1 tablet (10 mg total) by mouth every 6 (six) hours as needed for nausea or vomiting. 30 tablet 1   No current facility-administered medications for this visit.     SURGICAL HISTORY:  Past Surgical History:  Procedure Laterality Date  . CARDIAC CATHETERIZATION  07/08/2008   normal coronaries  . CARPAL TUNNEL RELEASE Right 1988  . COLONOSCOPY N/A 02/11/2016   Procedure: COLONOSCOPY;  Surgeon: Carol Ada, MD;  Location: [facility];  Service: Endoscopy;  Laterality: N/A;  . DILATION AND CURETTAGE OF UTERUS    . EYE SURGERY Bilateral   . H/O MET Test w/PFT  04/02/2012   low risk; peak VO2 77% predicted  . IR GENERIC HISTORICAL  09/12/2016   IR FLUORO GUIDE PORT INSERTION RIGHT 09/12/2016 [name], MD [facility]  . IR GENERIC HISTORICAL  09/12/2016   IR US GUIDE VASC ACCESS RIGHT 09/12/2016 [name], MD [facility]  . JOINT REPLACEMENT  2003   thumb rt  . KNEE ARTHROSCOPY  12   rt meniscus  . MEDIASTINOSCOPY N/A 10/30/2013   Procedure: MEDIASTINOSCOPY;  Surgeon: Melrose Nakayama, MD;   Location: [facility];  Service: Thoracic;  Laterality: N/A;  . ROTATOR CUFF REPAIR  2006   ? side  . THYROIDECTOMY  1973  . TRANSTHORACIC ECHOCARDIOGRAM  09/25/2012   EF 55-60%; mild LVH & mild concentric hypertrophy; mild AV regurg; RV systolic pressure increase consistent with mild pulm HTN  . VIDEO ASSISTED THORACOSCOPY (VATS)/ LOBECTOMY Right 11/13/2013   Procedure: VIDEO ASSISTED THORACOSCOPY (VATS) with mediastinal  biopsies;  Surgeon: Melrose Nakayama, MD;  Location: [facility];  Service: Thoracic;  Laterality: Right;  RIGHT VATS,mediastinal biopsies  . VIDEO BRONCHOSCOPY Bilateral 09/25/2013   Procedure: VIDEO BRONCHOSCOPY WITHOUT FLUORO;  Surgeon: Tanda Rockers, MD;  [facility];  Service: Cardiopulmonary;  Laterality: Bilateral;  . VIDEO BRONCHOSCOPY WITH ENDOBRONCHIAL ULTRASOUND N/A 10/30/2013   Procedure: VIDEO BRONCHOSCOPY WITH ENDOBRONCHIAL ULTRASOUND;  Surgeon: Melrose Nakayama, MD;  Location: [facility];  Service: Thoracic;  Laterality: N/A;    REVIEW OF SYSTEMS:  A comprehensive review of systems was negative except for: Constitutional: positive for fatigue Respiratory: positive for dyspnea on exertion   PHYSICAL EXAMINATION: General appearance: alert, cooperative, fatigued and no distress Head: Normocephalic, without obvious abnormality, atraumatic Neck: no adenopathy, no JVD, supple, symmetrical, trachea midline and thyroid not enlarged, symmetric, no tenderness/mass/nodules Lymph nodes: Cervical, supraclavicular, and axillary nodes normal. Resp: clear to auscultation bilaterally Back: symmetric, no curvature. ROM normal. No CVA tenderness. Cardio: regular rate and rhythm, S1, S2 normal, no murmur, click, rub or gallop GI: soft, non-tender; bowel sounds normal; no masses,  no organomegaly Extremities: extremities normal, atraumatic, no cyanosis or edema  ECOG PERFORMANCE STATUS: 1 - Symptomatic but completely ambulatory  Blood pressure (!) 161/63, pulse 83,  temperature 98.3 F (36.8 C), temperature source Oral, resp. rate 17, height _0  (1.473 m), weight 160 lb 8 oz (72.8 kg), SpO2 100 %.  LABORATORY DATA: Lab Results  Component Value Date   WBC 6.8 12/11/2016   HGB 10.2 (L) 12/11/2016   HCT 30.1 (L) 12/11/2016   MCV 96.2 12/11/2016   PLT 112 (L) 12/11/2016      Chemistry      Component Value Date/Time   NA 143 12/11/2016 1124   K 4.4 12/11/2016 1124   CL 106 08/22/2016 0552   CO2 27 12/11/2016 1124   BUN 18.9 12/11/2016 1124   CREATININE 0.8 12/11/2016 1124      Component Value Date/Time   CALCIUM 9.5 12/11/2016 1124   ALKPHOS 107 12/11/2016 1124   AST 19 12/11/2016 1124   ALT 16 12/11/2016 1124   BILITOT 0.69 12/11/2016 1124       RADIOGRAPHIC STUDIES: No results found.  ASSESSMENT AND PLAN: This is a very pleasant 76 years old white female with recurrent non-small cell lung cancer, squamous cell carcinoma. She underwent previous treatment with concurrent chemoradiation followed by consolidation chemotherapy. The patient had recurrence in December 2017 and she is currently undergoing systemic chemotherapy again with carboplatin and paclitaxel status post 5 cycles. She is tolerating the treatment well except for fatigue and weakness. Her dose of carboplatin has been reduced to AUC of 4 since cycle #5.  I recommended for the patient to proceed with cycle #6 today as a scheduled. I will arrange for her to receive IV fluid later this week because of the dehydration with chemotherapy. I would see her back for follow-up visit in 4 weeks for reevaluation with repeat CT scan of the chest, abdomen and pelvis for restaging of her disease. She was advised to call immediately if she has any concerning symptoms in the interval. The patient voices understanding of current disease status and treatment options and is in agreement with the current care plan. All questions were answered. The patient knows to call the clinic with any  problems, questions or concerns. We can certainly see the patient much sooner if necessary. I spent 10 minutes counseling the patient face to face. The total time spent in the appointment was 15 minutes.  Disclaimer: This note was dictated with voice recognition software. Similar sounding words can inadvertently be transcribed and may not be corrected upon review.

## 2016-12-11 NOTE — Patient Instructions (Addendum)
[facility] Discharge Instructions for Patients Receiving Chemotherapy  Today you received the following chemotherapy agents: Taxol and carboplatin   To help prevent nausea and vomiting after your treatment, we encourage you to take your nausea medication as directed.    If you develop nausea and vomiting that is not controlled by your nausea medication, call the clinic.   BELOW ARE SYMPTOMS THAT SHOULD BE REPORTED IMMEDIATELY:  *FEVER GREATER THAN 100.5 F  *CHILLS WITH OR WITHOUT FEVER  NAUSEA AND VOMITING THAT IS NOT CONTROLLED WITH YOUR NAUSEA MEDICATION  *UNUSUAL SHORTNESS OF BREATH  *UNUSUAL BRUISING OR BLEEDING  TENDERNESS IN MOUTH AND THROAT WITH OR WITHOUT PRESENCE OF ULCERS  *URINARY PROBLEMS  *BOWEL PROBLEMS  UNUSUAL RASH Items with * indicate a potential emergency and should be followed up as soon as possible.  Feel free to call the clinic you have any questions or concerns. The clinic phone number is [phone].  Please show the CHEMO ALERT CARD at check-in to the Emergency Department and triage nurse.   

## 2016-12-11 NOTE — Progress Notes (Signed)
Patient states that Benadryl makes her "restless". Per Dr. Julien Nordmann okay to give '25mg'$  IV Benadryl instead of '50mg'$ .

## 2016-12-11 NOTE — Telephone Encounter (Signed)
Gave patient AVS and calender per 4/9 los. [facility] to contact patient with CT schedule.

## 2016-12-13 ENCOUNTER — Ambulatory Visit (HOSPITAL_BASED_OUTPATIENT_CLINIC_OR_DEPARTMENT_OTHER): Payer: Medicare Other

## 2016-12-13 VITALS — BP 139/89 | HR 100 | Temp 98.2°F | Resp 18

## 2016-12-13 DIAGNOSIS — Z5189 Encounter for other specified aftercare: Secondary | ICD-10-CM

## 2016-12-13 DIAGNOSIS — C342 Malignant neoplasm of middle lobe, bronchus or lung: Secondary | ICD-10-CM | POA: Diagnosis not present

## 2016-12-13 MED ORDER — PEGFILGRASTIM INJECTION 6 MG/0.6ML ~~LOC~~
6.0000 mg | PREFILLED_SYRINGE | Freq: Once | SUBCUTANEOUS | Status: AC
  Administered 2016-12-13: 6 mg via SUBCUTANEOUS
  Filled 2016-12-13: qty 0.6

## 2016-12-13 NOTE — Patient Instructions (Signed)
Pegfilgrastim injection What is this medicine? PEGFILGRASTIM (PEG fil gra stim) is a long-acting granulocyte colony-stimulating factor that stimulates the growth of neutrophils, a type of white blood cell important in the body's fight against infection. It is used to reduce the incidence of fever and infection in patients with certain types of cancer who are receiving chemotherapy that affects the bone marrow, and to increase survival after being exposed to high doses of radiation. This medicine may be used for other purposes; ask your health care provider or pharmacist if you have questions. COMMON BRAND NAME(S): Neulasta What should I tell my health care provider before I take this medicine? They need to know if you have any of these conditions: -kidney disease -latex allergy -ongoing radiation therapy -sickle cell disease -skin reactions to acrylic adhesives (On-Body Injector only) -an unusual or allergic reaction to pegfilgrastim, filgrastim, other medicines, foods, dyes, or preservatives -pregnant or trying to get pregnant -breast-feeding How should I use this medicine? This medicine is for injection under the skin. If you get this medicine at home, you will be taught how to prepare and give the pre-filled syringe or how to use the On-body Injector. Refer to the patient Instructions for Use for detailed instructions. Use exactly as directed. Take your medicine at regular intervals. Do not take your medicine more often than directed. It is important that you put your used needles and syringes in a special sharps container. Do not put them in a trash can. If you do not have a sharps container, call your pharmacist or healthcare provider to get one. Talk to your pediatrician regarding the use of this medicine in children. While this drug may be prescribed for selected conditions, precautions do apply. Overdosage: If you think you have taken too much of this medicine contact a poison control  center or emergency room at once. NOTE: This medicine is only for you. Do not share this medicine with others. What if I miss a dose? It is important not to miss your dose. Call your doctor or health care professional if you miss your dose. If you miss a dose due to an On-body Injector failure or leakage, a new dose should be administered as soon as possible using a single prefilled syringe for manual use. What may interact with this medicine? Interactions have not been studied. Give your health care provider a list of all the medicines, herbs, non-prescription drugs, or dietary supplements you use. Also tell them if you smoke, drink alcohol, or use illegal drugs. Some items may interact with your medicine. This list may not describe all possible interactions. Give your health care provider a list of all the medicines, herbs, non-prescription drugs, or dietary supplements you use. Also tell them if you smoke, drink alcohol, or use illegal drugs. Some items may interact with your medicine. What should I watch for while using this medicine? You may need blood work done while you are taking this medicine. If you are going to need a MRI, CT scan, or other procedure, tell your doctor that you are using this medicine (On-Body Injector only). What side effects may I notice from receiving this medicine? Side effects that you should report to your doctor or health care professional as soon as possible: -allergic reactions like skin rash, itching or hives, swelling of the face, lips, or tongue -dizziness -fever -pain, redness, or irritation at site where injected -pinpoint red spots on the skin -red or dark-brown urine -shortness of breath or breathing problems -stomach or   side pain, or pain at the shoulder -swelling -tiredness -trouble passing urine or change in the amount of urine Side effects that usually do not require medical attention (report to your doctor or health care professional if they  continue or are bothersome): -bone pain -muscle pain This list may not describe all possible side effects. Call your doctor for medical advice about side effects. You may report side effects to FDA at 1-800-FDA-1088. Where should I keep my medicine? Keep out of the reach of children. Store pre-filled syringes in a refrigerator between 2 and 8 degrees C (36 and 46 degrees F). Do not freeze. Keep in carton to protect from light. Throw away this medicine if it is left out of the refrigerator for more than 48 hours. Throw away any unused medicine after the expiration date. NOTE: This sheet is a summary. It may not cover all possible information. If you have questions about this medicine, talk to your doctor, pharmacist, or health care provider.  2017 Elsevier/Gold Standard (2014-09-10 14:30:14)  

## 2016-12-15 ENCOUNTER — Ambulatory Visit (HOSPITAL_BASED_OUTPATIENT_CLINIC_OR_DEPARTMENT_OTHER): Payer: Medicare Other

## 2016-12-15 VITALS — BP 158/74 | HR 94 | Temp 98.3°F | Resp 16

## 2016-12-15 DIAGNOSIS — E860 Dehydration: Secondary | ICD-10-CM | POA: Diagnosis not present

## 2016-12-15 MED ORDER — HEPARIN SOD (PORK) LOCK FLUSH 100 UNIT/ML IV SOLN
500.0000 [IU] | Freq: Once | INTRAVENOUS | Status: AC | PRN
  Administered 2016-12-15: 500 [IU]
  Filled 2016-12-15: qty 5

## 2016-12-15 MED ORDER — SODIUM CHLORIDE 0.9% FLUSH
10.0000 mL | INTRAVENOUS | Status: DC | PRN
  Administered 2016-12-15: 10 mL
  Filled 2016-12-15: qty 10

## 2016-12-15 MED ORDER — SODIUM CHLORIDE 0.9 % IV SOLN
Freq: Once | INTRAVENOUS | Status: AC
  Administered 2016-12-15: 14:00:00 via INTRAVENOUS

## 2016-12-15 NOTE — Patient Instructions (Signed)
Dehydration, Adult Dehydration is a condition in which there is not enough fluid or water in the body. This happens when you lose more fluids than you take in. Important organs, such as the kidneys, brain, and heart, cannot function without a proper amount of fluids. Any loss of fluids from the body can lead to dehydration. Dehydration can range from mild to severe. This condition should be treated right away to prevent it from becoming severe. What are the causes? This condition may be caused by:  Vomiting.  Diarrhea.  Excessive sweating, such as from heat exposure or exercise.  Not drinking enough fluid, especially:  When ill.  While doing activity that requires a lot of energy.  Excessive urination.  Fever.  Infection.  Certain medicines, such as medicines that cause the body to lose excess fluid (diuretics).  Inability to access safe drinking water.  Reduced physical ability to get adequate water and food. What increases the risk? This condition is more likely to develop in people:  Who have a poorly controlled long-term (chronic) illness, such as diabetes, heart disease, or kidney disease.  Who are age 65 or older.  Who are disabled.  Who live in a place with high altitude.  Who play endurance sports. What are the signs or symptoms? Symptoms of mild dehydration may include:   Thirst.  Dry lips.  Slightly dry mouth.  Dry, warm skin.  Dizziness. Symptoms of moderate dehydration may include:   Very dry mouth.  Muscle cramps.  Dark urine. Urine may be the color of tea.  Decreased urine production.  Decreased tear production.  Heartbeat that is irregular or faster than normal (palpitations).  Headache.  Light-headedness, especially when you stand up from a sitting position.  Fainting (syncope). Symptoms of severe dehydration may include:   Changes in skin, such as:  Cold and clammy skin.  Blotchy (mottled) or pale skin.  Skin that does  not quickly return to normal after being lightly pinched and released (poor skin turgor).  Changes in body fluids, such as:  Extreme thirst.  No tear production.  Inability to sweat when body temperature is high, such as in hot weather.  Very little urine production.  Changes in vital signs, such as:  Weak pulse.  Pulse that is more than 100 beats a minute when sitting still.  Rapid breathing.  Low blood pressure.  Other changes, such as:  Sunken eyes.  Cold hands and feet.  Confusion.  Lack of energy (lethargy).  Difficulty waking up from sleep.  Short-term weight loss.  Unconsciousness. How is this diagnosed? This condition is diagnosed based on your symptoms and a physical exam. Blood and urine tests may be done to help confirm the diagnosis. How is this treated? Treatment for this condition depends on the severity. Mild or moderate dehydration can often be treated at home. Treatment should be started right away. Do not wait until dehydration becomes severe. Severe dehydration is an emergency and it needs to be treated in a hospital. Treatment for mild dehydration may include:   Drinking more fluids.  Replacing salts and minerals in your blood (electrolytes) that you may have lost. Treatment for moderate dehydration may include:   Drinking an oral rehydration solution (ORS). This is a drink that helps you replace fluids and electrolytes (rehydrate). It can be found at pharmacies and retail stores. Treatment for severe dehydration may include:   Receiving fluids through an IV tube.  Receiving an electrolyte solution through a feeding tube that is   passed through your nose and into your stomach (nasogastric tube, or NG tube).  Correcting any abnormalities in electrolytes.  Treating the underlying cause of dehydration. Follow these instructions at home:  If directed by your health care provider, drink an ORS:  Make an ORS by following instructions on the  package.  Start by drinking small amounts, about  cup (120 mL) every 5-10 minutes.  Slowly increase how much you drink until you have taken the amount recommended by your health care provider.  Drink enough clear fluid to keep your urine clear or pale yellow. If you were told to drink an ORS, finish the ORS first, then start slowly drinking other clear fluids. Drink fluids such as:  Water. Do not drink only water. Doing that can lead to having too little salt (sodium) in the body (hyponatremia).  Ice chips.  Fruit juice that you have added water to (diluted fruit juice).  Low-calorie sports drinks.  Avoid:  Alcohol.  Drinks that contain a lot of sugar. These include high-calorie sports drinks, fruit juice that is not diluted, and soda.  Caffeine.  Foods that are greasy or contain a lot of fat or sugar.  Take over-the-counter and prescription medicines only as told by your health care provider.  Do not take sodium tablets. This can lead to having too much sodium in the body (hypernatremia).  Eat foods that contain a healthy balance of electrolytes, such as bananas, oranges, potatoes, tomatoes, and spinach.  Keep all follow-up visits as told by your health care provider. This is important. Contact a health care provider if:  You have abdominal pain that:  Gets worse.  Stays in one area (localizes).  You have a rash.  You have a stiff neck.  You are more irritable than usual.  You are sleepier or more difficult to wake up than usual.  You feel weak or dizzy.  You feel very thirsty.  You have urinated only a small amount of very dark urine over 6-8 hours. Get help right away if:  You have symptoms of severe dehydration.  You cannot drink fluids without vomiting.  Your symptoms get worse with treatment.  You have a fever.  You have a severe headache.  You have vomiting or diarrhea that:  Gets worse.  Does not go away.  You have blood or green matter  (bile) in your vomit.  You have blood in your stool. This may cause stool to look black and tarry.  You have not urinated in 6-8 hours.  You faint.  Your heart rate while sitting still is over 100 beats a minute.  You have trouble breathing. This information is not intended to replace advice given to you by your health care provider. Make sure you discuss any questions you have with your health care provider. Document Released: 08/21/2005 Document Revised: 03/17/2016 Document Reviewed: 10/15/2015 Elsevier Interactive Patient Education  2017 [employer]  

## 2016-12-18 ENCOUNTER — Other Ambulatory Visit (HOSPITAL_BASED_OUTPATIENT_CLINIC_OR_DEPARTMENT_OTHER): Payer: Medicare Other

## 2016-12-18 DIAGNOSIS — C342 Malignant neoplasm of middle lobe, bronchus or lung: Secondary | ICD-10-CM

## 2016-12-18 LAB — CBC WITH DIFFERENTIAL/PLATELET
BASO%: 1.4 % (ref 0.0–2.0)
Basophils Absolute: 0 10*3/uL (ref 0.0–0.1)
EOS%: 4.9 % (ref 0.0–7.0)
Eosinophils Absolute: 0.1 10*3/uL (ref 0.0–0.5)
HEMATOCRIT: 31.9 % — AB (ref 34.8–46.6)
HEMOGLOBIN: 10.9 g/dL — AB (ref 11.6–15.9)
LYMPH#: 1 10*3/uL (ref 0.9–3.3)
LYMPH%: 40.3 % (ref 14.0–49.7)
MCH: 33.2 pg (ref 25.1–34.0)
MCHC: 34.1 g/dL (ref 31.5–36.0)
MCV: 97.4 fL (ref 79.5–101.0)
MONO#: 0.4 10*3/uL (ref 0.1–0.9)
MONO%: 15.6 % — ABNORMAL HIGH (ref 0.0–14.0)
NEUT#: 0.9 10*3/uL — ABNORMAL LOW (ref 1.5–6.5)
NEUT%: 37.8 % — ABNORMAL LOW (ref 38.4–76.8)
Platelets: 67 10*3/uL — ABNORMAL LOW (ref 145–400)
RBC: 3.27 10*6/uL — ABNORMAL LOW (ref 3.70–5.45)
RDW: 19.9 % — AB (ref 11.2–14.5)
WBC: 2.5 10*3/uL — AB (ref 3.9–10.3)

## 2016-12-18 LAB — COMPREHENSIVE METABOLIC PANEL
ALT: 27 U/L (ref 0–55)
AST: 21 U/L (ref 5–34)
Albumin: 3.7 g/dL (ref 3.5–5.0)
Alkaline Phosphatase: 93 U/L (ref 40–150)
Anion Gap: 12 mEq/L — ABNORMAL HIGH (ref 3–11)
BUN: 20.8 mg/dL (ref 7.0–26.0)
CALCIUM: 9.4 mg/dL (ref 8.4–10.4)
CHLORIDE: 102 meq/L (ref 98–109)
CO2: 26 mEq/L (ref 22–29)
CREATININE: 1 mg/dL (ref 0.6–1.1)
EGFR: 58 mL/min/{1.73_m2} — ABNORMAL LOW (ref >90)
Glucose: 134 mg/dl (ref 70–140)
Potassium: 4 mEq/L (ref 3.5–5.1)
Sodium: 141 mEq/L (ref 136–145)
Total Bilirubin: 0.64 mg/dL (ref 0.20–1.20)
Total Protein: 6.6 g/dL (ref 6.4–8.3)

## 2016-12-25 ENCOUNTER — Other Ambulatory Visit (HOSPITAL_BASED_OUTPATIENT_CLINIC_OR_DEPARTMENT_OTHER): Payer: Medicare Other

## 2016-12-25 DIAGNOSIS — C342 Malignant neoplasm of middle lobe, bronchus or lung: Secondary | ICD-10-CM

## 2016-12-25 LAB — CBC WITH DIFFERENTIAL/PLATELET
BASO%: 0.4 % (ref 0.0–2.0)
Basophils Absolute: 0 10*3/uL (ref 0.0–0.1)
EOS%: 0.6 % (ref 0.0–7.0)
Eosinophils Absolute: 0.1 10*3/uL (ref 0.0–0.5)
HCT: 28.2 % — ABNORMAL LOW (ref 34.8–46.6)
HGB: 9.8 g/dL — ABNORMAL LOW (ref 11.6–15.9)
LYMPH%: 13 % — AB (ref 14.0–49.7)
MCH: 33.8 pg (ref 25.1–34.0)
MCHC: 34.6 g/dL (ref 31.5–36.0)
MCV: 97.9 fL (ref 79.5–101.0)
MONO#: 0.6 10*3/uL (ref 0.1–0.9)
MONO%: 5.8 % (ref 0.0–14.0)
NEUT%: 80.2 % — ABNORMAL HIGH (ref 38.4–76.8)
NEUTROS ABS: 8.7 10*3/uL — AB (ref 1.5–6.5)
Platelets: 142 10*3/uL — ABNORMAL LOW (ref 145–400)
RBC: 2.88 10*6/uL — AB (ref 3.70–5.45)
RDW: 20.3 % — ABNORMAL HIGH (ref 11.2–14.5)
WBC: 10.9 10*3/uL — AB (ref 3.9–10.3)
lymph#: 1.4 10*3/uL (ref 0.9–3.3)

## 2016-12-25 LAB — COMPREHENSIVE METABOLIC PANEL
ALT: 20 U/L (ref 0–55)
AST: 20 U/L (ref 5–34)
Albumin: 3.5 g/dL (ref 3.5–5.0)
Alkaline Phosphatase: 108 U/L (ref 40–150)
Anion Gap: 11 mEq/L (ref 3–11)
BILIRUBIN TOTAL: 0.5 mg/dL (ref 0.20–1.20)
BUN: 15.3 mg/dL (ref 7.0–26.0)
CO2: 28 meq/L (ref 22–29)
CREATININE: 0.8 mg/dL (ref 0.6–1.1)
Calcium: 9 mg/dL (ref 8.4–10.4)
Chloride: 107 mEq/L (ref 98–109)
EGFR: 73 mL/min/{1.73_m2} — ABNORMAL LOW (ref >90)
GLUCOSE: 101 mg/dL (ref 70–140)
Potassium: 3.8 mEq/L (ref 3.5–5.1)
SODIUM: 145 meq/L (ref 136–145)
TOTAL PROTEIN: 6.3 g/dL — AB (ref 6.4–8.3)

## 2017-01-01 ENCOUNTER — Ambulatory Visit (INDEPENDENT_AMBULATORY_CARE_PROVIDER_SITE_OTHER): Payer: Medicare Other | Admitting: Internal Medicine

## 2017-01-01 ENCOUNTER — Other Ambulatory Visit (HOSPITAL_BASED_OUTPATIENT_CLINIC_OR_DEPARTMENT_OTHER): Payer: Medicare Other

## 2017-01-01 ENCOUNTER — Encounter: Payer: Self-pay | Admitting: Internal Medicine

## 2017-01-01 VITALS — BP 146/85 | HR 87 | Ht 58.5 in | Wt 156.6 lb

## 2017-01-01 DIAGNOSIS — R5383 Other fatigue: Secondary | ICD-10-CM

## 2017-01-01 DIAGNOSIS — R0602 Shortness of breath: Secondary | ICD-10-CM

## 2017-01-01 DIAGNOSIS — I10 Essential (primary) hypertension: Secondary | ICD-10-CM

## 2017-01-01 DIAGNOSIS — C342 Malignant neoplasm of middle lobe, bronchus or lung: Secondary | ICD-10-CM | POA: Diagnosis not present

## 2017-01-01 DIAGNOSIS — I480 Paroxysmal atrial fibrillation: Secondary | ICD-10-CM | POA: Diagnosis not present

## 2017-01-01 LAB — CBC WITH DIFFERENTIAL/PLATELET
BASO%: 0.9 % (ref 0.0–2.0)
Basophils Absolute: 0.1 10*3/uL (ref 0.0–0.1)
EOS%: 0.6 % (ref 0.0–7.0)
Eosinophils Absolute: 0.1 10*3/uL (ref 0.0–0.5)
HCT: 29.2 % — ABNORMAL LOW (ref 34.8–46.6)
HGB: 10.2 g/dL — ABNORMAL LOW (ref 11.6–15.9)
LYMPH%: 16.4 % (ref 14.0–49.7)
MCH: 34.4 pg — ABNORMAL HIGH (ref 25.1–34.0)
MCHC: 34.7 g/dL (ref 31.5–36.0)
MCV: 99.2 fL (ref 79.5–101.0)
MONO#: 0.8 10*3/uL (ref 0.1–0.9)
MONO%: 8.1 % (ref 0.0–14.0)
NEUT#: 6.9 10*3/uL — ABNORMAL HIGH (ref 1.5–6.5)
NEUT%: 74 % (ref 38.4–76.8)
PLATELETS: 220 10*3/uL (ref 145–400)
RBC: 2.95 10*6/uL — ABNORMAL LOW (ref 3.70–5.45)
RDW: 21.5 % — AB (ref 11.2–14.5)
WBC: 9.4 10*3/uL (ref 3.9–10.3)
lymph#: 1.5 10*3/uL (ref 0.9–3.3)

## 2017-01-01 LAB — COMPREHENSIVE METABOLIC PANEL
ALT: 21 U/L (ref 0–55)
ANION GAP: 10 meq/L (ref 3–11)
AST: 22 U/L (ref 5–34)
Albumin: 3.7 g/dL (ref 3.5–5.0)
Alkaline Phosphatase: 112 U/L (ref 40–150)
BUN: 19.1 mg/dL (ref 7.0–26.0)
CHLORIDE: 107 meq/L (ref 98–109)
CO2: 26 meq/L (ref 22–29)
CREATININE: 0.8 mg/dL (ref 0.6–1.1)
Calcium: 9.5 mg/dL (ref 8.4–10.4)
EGFR: 73 mL/min/{1.73_m2} — AB (ref >90)
Glucose: 83 mg/dl (ref 70–140)
Potassium: 4.3 mEq/L (ref 3.5–5.1)
Sodium: 143 mEq/L (ref 136–145)
Total Bilirubin: 0.51 mg/dL (ref 0.20–1.20)
Total Protein: 6.7 g/dL (ref 6.4–8.3)

## 2017-01-01 NOTE — Patient Instructions (Signed)
Medication Instructions:  Your physician recommends that you continue on your current medications as directed. Please refer to the Current Medication list given to you today.  If you need a refill on your cardiac medications before your next appointment, please call your pharmacy.  Follow-Up: Your physician wants you to follow-up in: 6 [place] will receive a reminder letter in the mail two months in advance. If you don't receive a letter, please call our office AUGUST 2018 to schedule the November 2018 follow-up appointment.   Special Instructions: PLEASE [place]!!   Thank you for choosing [facility] at [facility]!!

## 2017-01-01 NOTE — Progress Notes (Signed)
OFFICE NOTE  Chief Complaint:  Follow-up shortness of breath  Primary Care Physician: [name], MD  HPI:  Kimberly [name] is a 76 year old female who recently has been having some chest discomfort and shortness of breath as well as 2 episodes of syncope, both of which were during stressful situations, once while on a ladder. We went ahead and checked an echocardiogram which essentially shows only mild abnormalities. She did have a loudly calcified aortic valve which was not stenotic; however, there was mild regurgitation. There was mild LVH with EF 55% to 60% and grade 1 diastolic dysfunction. She had borderline pulmonary hypertension. A monitor was also worn by her from September 13, 2012 to October 12, 2012. This showed a predominantly sinus rhythm with PACs. There was, however, 1 episode of a nonsustained ventricular tachycardia which was about 5 beats. Of course she potentially would have to had longer episodes of NSVT this could have been playing a role in her syncopal event. I do not think she, however, is at high risk for lethal sustained VT given her normal ejection fraction and no clear evidence of ischemia. As you recall, she had a low-risk MET-test on April 02, 2012, with a peak VO2 of 77% predicted without any evidence of an ischemic response. This would make it quite unlikely that she has underlying coronary blockage that is responsible for her episode. I have, however, recommended adding a beta blocker to her regimen which will additionally help control her recent uncontrolled hypertension. This is Toprol XL 12.5 mg daily. It should help also with the VT. she is continuing to take low-dose Toprol and has had no further episodes of passing out in the past 6 months. She was thoroughly evaluated by a neurologist and felt to possibly be having a vasodepressive syncope. There was lower extremity.swelling and compression stockings were recommended.  Kimberly [name] returns today for  followup. She has been hospitalized a number of times since getting a diagnosis of lung cancer. 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 Unfortunately she developed A. fib with RVR and was treated and placed on amiodarone. She's been having problems with recurrent vasovagal syncope and hypotension. She does have a mass that is pressing against the atrium.  I saw Kimberly [name] back in the office today. She is undergoing chemotherapy which is cause some mild shrinkage of her tumor however it is not disappeared, and apparently is incurable and unresectable. Fortunate she's had no recurrence of her A. fib and remains on low-dose amiodarone. She says that she feels better than she had when she was undergoing radiation chemotherapy. She has no new complaints.  Kimberly [name] returns today for follow-up. She seems to be holding her own with regards for lung cancer. She has another 4 month reprieve to see her oncologist. Her most recent scan showed a slight increase in the size of her cancer with another small tumor but she seems to be taking this pretty well. She's not had recurrent A. fib from what I can tell and maintains on low-dose amiodarone. She is due for repeat lipid testing as well as surveillance thyroid testing.   Kimberly [name] returns today for follow-up. She denies any chest pain although does get short of breath with some activities. She reports that her lung cancer is still growing at a slow rate however  She was intolerant of extensive chemotherapy and is now on a chemotherapy break. Recently I received notification from her optometrist  that she has some amiodarone deposits in her eyes and therefore he recommended discontinuing her medication if it was feasible. Since she's not had A. Fib in some time I thought it would be reasonable to take her off the medicine to see if there would be any recurrence. She's had some vision loss unrelated to amiodarone and we did not like  to see that adding to it.  05/03/2016  Kimberly [name] returns today for follow-up. She says that recently she's continued to be fatigued. She does not relate this to her recent lung cancer and chemotherapy. She stopped taking atorvastatin due to muscle aches and reports some improvement and no symptoms. She is concerned about cardiac causes of her symptoms including coronary artery disease. Blood pressure appears well controlled today. EKG shows a sinus rhythm with poor anterior R-wave progression and 82.  06/05/2016  Kimberly [name] was seen today in follow-up. She underwent nuclear stress testing which was negative for ischemia and showed normal LV function. I reassured her that I did not feel that her fatigue is related to worsening coronary artery disease.  01/01/2017  Kimberly [name] returns today for follow-up. Unfortunately she's had some recurrent cancer. She underwent another round of chemotherapy. After this chemotherapy she was noted to be more short of breath and fatigues much easier. She's also had some worsening dependent edema. She says the swelling typically goes down in the morning and is swollen by the end of the day when she's been up on her feet. She recently had a stress test which was negative for ischemia and showed normal LV function.  PMHx:  Past Medical History:  Diagnosis Date  . Allergic asthma without acute exacerbation or status asthmaticus   . Aortic insufficiency   . Arthritis   . Atrial fibrillation ([place])   . Back pain 08/14/2016  . Bronchitis   . Cough    dry, endobronchial mass  . Dyslipidemia   . Family history of adverse reaction to anesthesia    pts son also experiences N/V  . GERD (gastroesophageal reflux disease)   . Heart murmur   . History of blood transfusion   . History of bronchitis   . History of chemotherapy   . History of nuclear stress test 02/26/2006   exercise myoview; normal pattern of perfusion; low risk scan   . Hypertension   . Hypothyroidism   . Mitral  insufficiency   . Pneumonia   . PONV (postoperative nausea and vomiting)   . Radiation 12/01/13-01/08/14   50.4 gray to right central chest  . Shortness of breath    with exertion  . Squamous cell carcinoma of lung ([place])   . Syncope    "states she has passed out a few times. dr is trying to find cause.last time when geeting ready to go home after video bronch/bx  . Thyroid disease     Past Surgical History:  Procedure Laterality Date  . CARDIAC CATHETERIZATION  07/08/2008   normal coronaries  . CARPAL TUNNEL RELEASE Right 1988  . COLONOSCOPY N/A 02/11/2016   Procedure: COLONOSCOPY;  Surgeon: Carol Ada, MD;  [facility];  Service: Endoscopy;  Laterality: N/A;  . DILATION AND CURETTAGE OF UTERUS    . EYE SURGERY Bilateral   . H/O MET Test w/PFT  04/02/2012   low risk; peak VO2 77% predicted  . IR GENERIC HISTORICAL  09/12/2016   IR FLUORO GUIDE PORT INSERTION RIGHT 09/12/2016 [name], MD [facility]  . IR GENERIC HISTORICAL  09/12/2016  IR US GUIDE VASC ACCESS RIGHT 09/12/2016 [name], MD [facility]  . JOINT REPLACEMENT  2003   thumb rt  . KNEE ARTHROSCOPY  12   rt meniscus  . MEDIASTINOSCOPY N/A 10/30/2013   Procedure: MEDIASTINOSCOPY;  Surgeon: Melrose Nakayama, MD;  Location: [facility];  Service: Thoracic;  Laterality: N/A;  . ROTATOR CUFF REPAIR  2006   ? side  . THYROIDECTOMY  1973  . TRANSTHORACIC ECHOCARDIOGRAM  09/25/2012   EF 55-60%; mild LVH & mild concentric hypertrophy; mild AV regurg; RV systolic pressure increase consistent with mild pulm HTN  . VIDEO ASSISTED THORACOSCOPY (VATS)/ LOBECTOMY Right 11/13/2013   Procedure: VIDEO ASSISTED THORACOSCOPY (VATS) with mediastinal  biopsies;  Surgeon: Melrose Nakayama, MD;  Location: [facility];  Service: Thoracic;  Laterality: Right;  RIGHT VATS,mediastinal biopsies  . VIDEO BRONCHOSCOPY Bilateral 09/25/2013   Procedure: VIDEO BRONCHOSCOPY WITHOUT FLUORO;  Surgeon: Tanda Rockers, MD;  [facility];  Service: Cardiopulmonary;  Laterality: Bilateral;  . VIDEO BRONCHOSCOPY WITH ENDOBRONCHIAL ULTRASOUND N/A 10/30/2013   Procedure: VIDEO BRONCHOSCOPY WITH ENDOBRONCHIAL ULTRASOUND;  Surgeon: Melrose Nakayama, MD;  Location: [facility];  Service: Thoracic;  Laterality: N/A;    FAMHx:  Family History  Problem Relation Age of Onset  . Lung cancer Mother   . Heart attack Father   . Heart failure Maternal Grandfather   . Heart attack Paternal Grandfather     SOCHx:   reports that she quit smoking about 49 years ago. Her smoking use included Cigarettes. She has a 3.00 pack-year smoking history. She has never used smokeless tobacco. She reports that she drinks alcohol. She reports that she does not use drugs.  ALLERGIES:  Allergies  Allergen Reactions  . Lactose Intolerance (Gi) Other (See Comments)  . Amiodarone Other (See Comments)    Corneal deposits  . Augmentin [Amoxicillin-Pot Clavulanate] Diarrhea  . Milk-Related Compounds Other (See Comments)    GI upset, projectile vomiting - can't take any dairy products    ROS: Pertinent items noted in HPI and remainder of comprehensive ROS otherwise negative.  HOME MEDS: Current Outpatient Prescriptions  Medication Sig Dispense Refill  . albuterol (PROVENTIL HFA;VENTOLIN HFA) 108 (90 BASE) MCG/ACT inhaler Inhale 1 puff into the lungs every 6 (six) hours as needed for wheezing or shortness of breath.     [name] aspirin EC 81 MG tablet Take 81 mg by mouth daily.    [name] atorvastatin (LIPITOR) 20 MG tablet Take 20 mg by mouth every morning.     . chlorthalidone (HYGROTON) 25 MG tablet TAKE 1 TABLET BY MOUTH  DAILY 90 tablet 1  . levothyroxine (SYNTHROID, LEVOTHROID) 150 MCG tablet Take 150 mcg by mouth as directed. Take 1 tablet once every Tuesday, Thursday, Saturday, and Sunday.    . lidocaine-prilocaine (EMLA) cream Apply 1 application topically as needed. 30 g 0  . loperamide (IMODIUM) 2 MG capsule Take 2 mg by mouth as needed for  diarrhea or loose stools.    . meloxicam (MOBIC) 7.5 MG tablet Take 7.5 mg by mouth 2 (two) times daily as needed for pain.    . methocarbamol (ROBAXIN) 500 MG tablet Take 500 mg by mouth every 8 (eight) hours as needed for muscle spasms.    . metoprolol succinate (TOPROL-XL) 25 MG 24 hr tablet Take 25 mg by mouth daily. Take 1/2 tablet daily    . montelukast (SINGULAIR) 10 MG tablet Take 10 mg by mouth at bedtime.     . Multiple  Vitamins-Iron (MULTIVITAMIN/IRON PO) Take 1 tablet by mouth daily.      No current facility-administered medications for this visit.     LABS/IMAGING: No results found for this or any previous visit (from the past 48 hour(s)). No results found.  VITALS: BP (!) 146/85   Pulse 87   Ht 4' 10.5" (1.486 m)   Wt 156 lb 9.6 oz (71 kg)   BMI 32.17 kg/m   EXAM: General appearance: alert and no distress Lungs: diminished breath sounds bilaterally Heart: regular rate and rhythm Extremities: extremities normal, atraumatic, no cyanosis or edema Neurologic: Grossly normal  EKG: Normal sinus rhythm at 87  ASSESSMENT: 1. Progressive fatigue and dyspnea - low risk myoview stress test (EF 60%) - 05/2016 2. Lung cancer (non-small cell stage III) 3. Recurrent vasodepressor syncope  4. Lower extremity edema 5. Hypertension - blood pressure now elevated 6. Paroxysmal atrial fibrillation - no recurrence, brief and related to para-atrial tumor  PLAN: 1.   Kimberly [name] had a recent low risk stress test with normal LV function. She's had recent worsening of lower strandy swelling which I don't think is cardiac. She is on chlorthalidone would benefit from wearing compression stockings. I think is related to her systemic chemotherapy. I also think her fatigue and shortness of breath is related to that as well. I've encouraged her to continue working on walking and more activity. Plan to see her back in 6 months or sooner as necessary.  Pixie Casino, MD, [facility] Attending  Cardiologist [place]  01/01/2017, 11:09 AM

## 2017-01-05 ENCOUNTER — Ambulatory Visit (HOSPITAL_COMMUNITY)
Admission: RE | Admit: 2017-01-05 | Discharge: 2017-01-05 | Disposition: A | Discharge status: Home / Self Care | Payer: Medicare Other | Source: Ambulatory Visit | Attending: Internal Medicine | Admitting: Internal Medicine

## 2017-01-05 DIAGNOSIS — D649 Anemia, unspecified: Secondary | ICD-10-CM | POA: Diagnosis not present

## 2017-01-05 DIAGNOSIS — K5730 Diverticulosis of large intestine without perforation or abscess without bleeding: Secondary | ICD-10-CM | POA: Diagnosis not present

## 2017-01-05 DIAGNOSIS — C342 Malignant neoplasm of middle lobe, bronchus or lung: Secondary | ICD-10-CM | POA: Diagnosis not present

## 2017-01-05 DIAGNOSIS — M47896 Other spondylosis, lumbar region: Secondary | ICD-10-CM | POA: Insufficient documentation

## 2017-01-05 DIAGNOSIS — D179 Benign lipomatous neoplasm, unspecified: Secondary | ICD-10-CM | POA: Insufficient documentation

## 2017-01-05 DIAGNOSIS — M5136 Other intervertebral disc degeneration, lumbar region: Secondary | ICD-10-CM | POA: Diagnosis not present

## 2017-01-05 DIAGNOSIS — I700 Atherosclerosis of aorta: Secondary | ICD-10-CM | POA: Insufficient documentation

## 2017-01-05 DIAGNOSIS — J984 Other disorders of lung: Secondary | ICD-10-CM | POA: Insufficient documentation

## 2017-01-05 DIAGNOSIS — Z5111 Encounter for antineoplastic chemotherapy: Secondary | ICD-10-CM

## 2017-01-05 MED ORDER — HEPARIN SOD (PORK) LOCK FLUSH 100 UNIT/ML IV SOLN
INTRAVENOUS | Status: AC
  Filled 2017-01-05: qty 5

## 2017-01-05 MED ORDER — IOPAMIDOL (ISOVUE-300) INJECTION 61%
INTRAVENOUS | Status: AC
  Administered 2017-01-05: 100 mL
  Filled 2017-01-05: qty 100

## 2017-01-05 MED ORDER — HEPARIN SOD (PORK) LOCK FLUSH 100 UNIT/ML IV SOLN
500.0000 [IU] | Freq: Once | INTRAVENOUS | Status: AC
  Administered 2017-01-05: 500 [IU] via INTRAVENOUS

## 2017-01-09 ENCOUNTER — Ambulatory Visit (HOSPITAL_BASED_OUTPATIENT_CLINIC_OR_DEPARTMENT_OTHER): Payer: Medicare Other | Admitting: Internal Medicine

## 2017-01-09 ENCOUNTER — Encounter: Payer: Self-pay | Admitting: Internal Medicine

## 2017-01-09 VITALS — BP 141/86 | HR 97 | Temp 98.9°F | Resp 19 | Ht <= 58 in | Wt 158.2 lb

## 2017-01-09 DIAGNOSIS — E039 Hypothyroidism, unspecified: Secondary | ICD-10-CM

## 2017-01-09 DIAGNOSIS — R530 Neoplastic (malignant) related fatigue: Secondary | ICD-10-CM | POA: Diagnosis not present

## 2017-01-09 DIAGNOSIS — C3411 Malignant neoplasm of upper lobe, right bronchus or lung: Secondary | ICD-10-CM

## 2017-01-09 DIAGNOSIS — G620 Drug-induced polyneuropathy: Secondary | ICD-10-CM

## 2017-01-09 DIAGNOSIS — C342 Malignant neoplasm of middle lobe, bronchus or lung: Secondary | ICD-10-CM

## 2017-01-09 DIAGNOSIS — R5383 Other fatigue: Secondary | ICD-10-CM

## 2017-01-09 DIAGNOSIS — Z5111 Encounter for antineoplastic chemotherapy: Secondary | ICD-10-CM

## 2017-01-09 NOTE — Progress Notes (Signed)
[place] [phone]   Fax:([phone]) [place], [place] [address] [place] [id number]  DIAGNOSIS: Recurrent non-small cell lung cancer, squamous cell carcinoma initially diagnosed as unresectable a stage IIIa in February 2015.  PRIOR THERAPY: 1) Status post exploratory right VATS with mediastinal biopsy under the care of Dr. Roxan Hockey on 11/13/2013. The tumor was found to be unresectable at that time.[name] 2) Concurrent chemoradiation with weekly carboplatin for AUC of 2 and paclitaxel 45 mg/M2, status post 6 cycles with partial response. First cycle was given on 12/01/2013. 3) Consolidation chemotherapy with carboplatin for AUC of 5 and paclitaxel 175 mg/M2 every 3 weeks with Neulasta support. First dose 02/23/2014. Status post 3 cycles with partial response. 4) Systemic chemotherapy with carboplatin for AUC of 5 and paclitaxel 175 MG/M2 every 3 weeks with Neulasta support. Status post 6 cycles. First dose was given on 08/14/2016.  CURRENT THERAPY: Observation.  INTERVAL HISTORY: Kimberly [name] 76 y.o. female returns to the clinic today for follow-up visit. The patient completed a course of systemic chemotherapy with carboplatin and paclitaxel status post 6 cycles. 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 She also has some sciatica pain in the left lower extremity. She denied having any current chest pain but has shortness of breath with exertion with no cough or hemoptysis. She denied having any fatigue or weakness. She has no nausea, vomiting, diarrhea or constipation. The patient had repeat CT scan of the chest, abdomen and pelvis performed recently and she is here for evaluation and discussion of her scan results.  MEDICAL HISTORY: Past Medical History:  Diagnosis Date  . Allergic asthma without acute exacerbation or  status asthmaticus   . Aortic insufficiency   . Arthritis   . Atrial fibrillation ([place])   . Back pain 08/14/2016  . Bronchitis   . Cough    dry, endobronchial mass  . Dyslipidemia   . Family history of adverse reaction to anesthesia    pts son also experiences N/V  . GERD (gastroesophageal reflux disease)   . Heart murmur   . History of blood transfusion   . History of bronchitis   . History of chemotherapy   . History of nuclear stress test 02/26/2006   exercise myoview; normal pattern of perfusion; low risk scan   . Hypertension   . Hypothyroidism   . Mitral insufficiency   . Pneumonia   . PONV (postoperative nausea and vomiting)   . Radiation 12/01/13-01/08/14   50.4 gray to right central chest  . Shortness of breath    with exertion  . Squamous cell carcinoma of lung ([place])   . Syncope    "states she has passed out a few times. dr is trying to find cause.last time when geeting ready to go home after video bronch/bx  . Thyroid disease     ALLERGIES:  is allergic to lactose intolerance (gi); amiodarone; augmentin [amoxicillin-pot clavulanate]; and milk-related compounds.  MEDICATIONS:  Current Outpatient Prescriptions  Medication Sig Dispense Refill  . albuterol (PROVENTIL HFA;VENTOLIN HFA) 108 (90 BASE) MCG/ACT inhaler Inhale 1 puff into the lungs every 6 (six) hours as needed for wheezing or shortness of breath.     [name] aspirin EC 81 MG tablet Take 81 mg by mouth daily.    [name] atorvastatin (LIPITOR) 20 MG tablet Take 20 mg by mouth every morning.     [name]  chlorthalidone (HYGROTON) 25 MG tablet TAKE 1 TABLET BY MOUTH  DAILY 90 tablet 1  . levothyroxine (SYNTHROID, LEVOTHROID) 150 MCG tablet Take 150 mcg by mouth as directed. Take 1 tablet once every Tuesday, Thursday, Saturday, and Sunday.    . lidocaine-prilocaine (EMLA) cream Apply 1 application topically as needed. 30 g 0  . loperamide (IMODIUM) 2 MG capsule Take 2 mg by mouth as needed for diarrhea or loose stools.    .  meloxicam (MOBIC) 7.5 MG tablet Take 7.5 mg by mouth 2 (two) times daily as needed for pain.    . methocarbamol (ROBAXIN) 500 MG tablet Take 500 mg by mouth every 8 (eight) hours as needed for muscle spasms.    . metoprolol succinate (TOPROL-XL) 25 MG 24 hr tablet Take 25 mg by mouth daily. Take 1/2 tablet daily    . montelukast (SINGULAIR) 10 MG tablet Take 10 mg by mouth at bedtime.     . Multiple Vitamins-Iron (MULTIVITAMIN/IRON PO) Take 1 tablet by mouth daily.      No current facility-administered medications for this visit.     SURGICAL HISTORY:  Past Surgical History:  Procedure Laterality Date  . CARDIAC CATHETERIZATION  07/08/2008   normal coronaries  . CARPAL TUNNEL RELEASE Right 1988  . COLONOSCOPY N/A 02/11/2016   Procedure: COLONOSCOPY;  Surgeon: Carol Ada, MD;  Location: [facility];  Service: Endoscopy;  Laterality: N/A;  . DILATION AND CURETTAGE OF UTERUS    . EYE SURGERY Bilateral   . H/O MET Test w/PFT  04/02/2012   low risk; peak VO2 77% predicted  . IR GENERIC HISTORICAL  09/12/2016   IR FLUORO GUIDE PORT INSERTION RIGHT 09/12/2016 [name], MD [facility]  . IR GENERIC HISTORICAL  09/12/2016   IR US GUIDE VASC ACCESS RIGHT 09/12/2016 [name], MD [facility]  . JOINT REPLACEMENT  2003   thumb rt  . KNEE ARTHROSCOPY  12   rt meniscus  . MEDIASTINOSCOPY N/A 10/30/2013   Procedure: MEDIASTINOSCOPY;  Surgeon: Melrose Nakayama, MD;  Location: [facility];  Service: Thoracic;  Laterality: N/A;  . ROTATOR CUFF REPAIR  2006   ? side  . THYROIDECTOMY  1973  . TRANSTHORACIC ECHOCARDIOGRAM  09/25/2012   EF 55-60%; mild LVH & mild concentric hypertrophy; mild AV regurg; RV systolic pressure increase consistent with mild pulm HTN  . VIDEO ASSISTED THORACOSCOPY (VATS)/ LOBECTOMY Right 11/13/2013   Procedure: VIDEO ASSISTED THORACOSCOPY (VATS) with mediastinal  biopsies;  Surgeon: Melrose Nakayama, MD;  Location: [facility];  Service: Thoracic;  Laterality: Right;   RIGHT VATS,mediastinal biopsies  . VIDEO BRONCHOSCOPY Bilateral 09/25/2013   Procedure: VIDEO BRONCHOSCOPY WITHOUT FLUORO;  Surgeon: Tanda Rockers, MD;  Location: [facility];  Service: Cardiopulmonary;  Laterality: Bilateral;  . VIDEO BRONCHOSCOPY WITH ENDOBRONCHIAL ULTRASOUND N/A 10/30/2013   Procedure: VIDEO BRONCHOSCOPY WITH ENDOBRONCHIAL ULTRASOUND;  Surgeon: Melrose Nakayama, MD;  Location: [facility];  Service: Thoracic;  Laterality: N/A;    REVIEW OF SYSTEMS:  Constitutional: positive for fatigue Eyes: negative Ears, nose, mouth, throat, and face: negative Respiratory: positive for dyspnea on exertion Cardiovascular: negative Gastrointestinal: negative Genitourinary:negative Integument/breast: negative Hematologic/lymphatic: negative Musculoskeletal:positive for back pain Neurological: positive for paresthesia Behavioral/Psych: negative Endocrine: negative Allergic/Immunologic: negative   PHYSICAL EXAMINATION: General appearance: alert, cooperative, fatigued and no distress Head: Normocephalic, without obvious abnormality, atraumatic Neck: no adenopathy, no JVD, supple, symmetrical, trachea midline and thyroid not enlarged, symmetric, no tenderness/mass/nodules Lymph nodes: Cervical, supraclavicular, and axillary nodes normal. Resp: clear to  auscultation bilaterally Back: symmetric, no curvature. ROM normal. No CVA tenderness. Cardio: regular rate and rhythm, S1, S2 normal, no murmur, click, rub or gallop GI: soft, non-tender; bowel sounds normal; no masses,  no organomegaly Extremities: extremities normal, atraumatic, no cyanosis or edema Neurologic: Alert and oriented X 3, normal strength and tone. Normal symmetric reflexes. Normal coordination and gait  ECOG PERFORMANCE STATUS: 1 - Symptomatic but completely ambulatory  Blood pressure (!) 141/86, pulse 97, temperature 98.9 F (37.2 C), temperature source Oral, resp. rate 19, height '4\' 10"'  (1.473 m), weight 158 lb 3.2 oz  (71.8 kg), SpO2 100 %.  LABORATORY DATA: Lab Results  Component Value Date   WBC 9.4 01/01/2017   HGB 10.2 (L) 01/01/2017   HCT 29.2 (L) 01/01/2017   MCV 99.2 01/01/2017   PLT 220 01/01/2017      Chemistry      Component Value Date/Time   NA 143 01/01/2017 1157   K 4.3 01/01/2017 1157   CL 106 08/22/2016 0552   CO2 26 01/01/2017 1157   BUN 19.1 01/01/2017 1157   CREATININE 0.8 01/01/2017 1157      Component Value Date/Time   CALCIUM 9.5 01/01/2017 1157   ALKPHOS 112 01/01/2017 1157   AST 22 01/01/2017 1157   ALT 21 01/01/2017 1157   BILITOT 0.51 01/01/2017 1157       RADIOGRAPHIC STUDIES: Ct Chest W Contrast  Result Date: 01/05/2017 CLINICAL DATA:  Restaging of right middle lobe squamous cell carcinoma. Tumor was found to be unresectable at exploratory right VATS on 11/13/2013 (tumor was directly extending into the mediastinum, preventing resection). Previous treatment included chemoradiation and consolidation chemotherapy. Recurrence in December 2017. EXAM: CT CHEST, ABDOMEN, AND PELVIS WITH CONTRAST TECHNIQUE: Multidetector CT imaging of the chest, abdomen and pelvis was performed following the standard protocol during bolus administration of intravenous contrast. CONTRAST:  166m ISOVUE-300 IOPAMIDOL (ISOVUE-300) INJECTION 61% COMPARISON:  10/27/2016 FINDINGS: CT CHEST FINDINGS Cardiovascular: Atherosclerotic calcification of the aortic arch. Mediastinum/Nodes: Soft tissue density is present around the right mainstem bronchus and subcarinal regions. Excluding my estimation of the margin of the esophagus, the subcarinal soft tissue density measures 1.4 cm anterior- posterior, and by my measurement was previously 1.5 cm. The rind of soft tissue density around the right mainstem bronchus appears similar to prior. It is noted that some of this density may represent consolidation from radiation pneumonitis and atelectasis rather than necessarily all being from tumor. Images 18 through  22 of series 2 are representative and have an overall grossly similar appearance to the prior exam. No new thoracic adenopathy observed. Lungs/Pleura: Also similar to the prior exam is the consolidation posteriorly in the left upper lobe and in the superior segment right lower lobe as well as in the right middle lobe, with associated volume loss and air bronchograms. 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 Difficult to exclude underlying tumor in some of the consolidated segments. There is some shift of cardiac and mediastinal structures to the right indicating a component of volume loss. A large central calcification is present in an 8 mm left lower lobe pulmonary nodule on image 90/4, no change. A smaller peripheral nodule measuring 3 mm in the subpleural position on image 91/4 is likewise unchanged. There is a calcified granuloma laterally in the left lower lobe on image 99/4, stable. Musculoskeletal: Stable lipoma along the left infraspinatus and teres minor muscles. Lower thoracic spondylosis. CT  ABDOMEN PELVIS FINDINGS Hepatobiliary: Unremarkable Pancreas: Unremarkable Spleen: Unremarkable Adrenals/Urinary Tract: Several small renal lesions are technically too small to characterize although statistically likely to be cysts. Adrenal glands normal. Stomach/Bowel: Sigmoid colon diverticulosis. Vascular/Lymphatic: Aortoiliac atherosclerotic vascular disease. No pathologic adenopathy. Reproductive: Unremarkable Other: No supplemental non-categorized findings. 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 Grade 1 degenerative anterolisthesis at L3-4 and L4-5 with degenerative endplate findings at U9-8. Suspected right foraminal impingement at L2- 3 with possible bilateral foraminal impingement at L3-4 and L4-5. IMPRESSION: 1. Overall stable appearance of soft tissue density  around the right mainstem bronchus and subcarinal regions, without progression. Stable consolidated regions in the right lung likely primarily due to volume loss and radiation pneumonitis. 2. Other imaging findings of potential clinical significance: Aortic Atherosclerosis (ICD10-I70.0). Lipoma is of the proximal right sartorius muscle and the left infraspinatus muscle. Lumbar spondylosis and degenerative disc disease with suspected foraminal impingement. Sigmoid colon diverticulosis. Several calcified granulomas in the left lower lobe. Electronically Signed   By: Van Clines M.D.   On: 01/05/2017 09:34   Ct Abdomen Pelvis W Contrast  Result Date: 01/05/2017 CLINICAL DATA:  Restaging of right middle lobe squamous cell carcinoma. Tumor was found to be unresectable at exploratory right VATS on 11/13/2013 (tumor was directly extending into the mediastinum, preventing resection). Previous treatment included chemoradiation and consolidation chemotherapy. Recurrence in December 2017. EXAM: CT CHEST, ABDOMEN, AND PELVIS WITH CONTRAST TECHNIQUE: Multidetector CT imaging of the chest, abdomen and pelvis was performed following the standard protocol during bolus administration of intravenous contrast. CONTRAST:  167m ISOVUE-300 IOPAMIDOL (ISOVUE-300) INJECTION 61% COMPARISON:  10/27/2016 FINDINGS: CT CHEST FINDINGS Cardiovascular: Atherosclerotic calcification of the aortic arch. Mediastinum/Nodes: Soft tissue density is present around the right mainstem bronchus and subcarinal regions. Excluding my estimation of the margin of the esophagus, the subcarinal soft tissue density measures 1.4 cm anterior- posterior, and by my measurement was previously 1.5 cm. The rind of soft tissue density around the right mainstem bronchus appears similar to prior. It is noted that some of this density may represent consolidation from radiation pneumonitis and atelectasis rather than necessarily all being from tumor. Images 18  through 22 of series 2 are representative and have an overall grossly similar appearance to the prior exam. No new thoracic adenopathy observed. Lungs/Pleura: Also similar to the prior exam is the consolidation posteriorly in the left upper lobe and in the superior segment right lower lobe as well as in the right middle lobe, with associated volume loss and air bronchograms. 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 Difficult to exclude underlying tumor in some of the consolidated segments. There is some shift of cardiac and mediastinal structures to the right indicating a component of volume loss. A large central calcification is present in an 8 mm left lower lobe pulmonary nodule on image 90/4, no change. A smaller peripheral nodule measuring 3 mm in the subpleural position on image 91/4 is likewise unchanged. There is a calcified granuloma laterally in the left lower lobe on image 99/4, stable. Musculoskeletal: Stable lipoma along the left infraspinatus and teres minor muscles. Lower thoracic spondylosis. CT ABDOMEN PELVIS FINDINGS Hepatobiliary: Unremarkable Pancreas: Unremarkable Spleen: Unremarkable Adrenals/Urinary Tract: Several small renal lesions are technically too small to characterize although statistically likely to be cysts. Adrenal glands normal. Stomach/Bowel: Sigmoid colon diverticulosis. Vascular/Lymphatic: Aortoiliac atherosclerotic vascular disease. No pathologic adenopathy. Reproductive: Unremarkable Other: No supplemental  non-categorized findings. 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 Grade 1 degenerative anterolisthesis at L3-4 and L4-5 with degenerative endplate findings at Z6-1. Suspected right foraminal impingement at L2- 3 with possible bilateral foraminal impingement at L3-4 and L4-5. IMPRESSION: 1. Overall stable appearance of soft tissue  density around the right mainstem bronchus and subcarinal regions, without progression. Stable consolidated regions in the right lung likely primarily due to volume loss and radiation pneumonitis. 2. Other imaging findings of potential clinical significance: Aortic Atherosclerosis (ICD10-I70.0). Lipoma is of the proximal right sartorius muscle and the left infraspinatus muscle. Lumbar spondylosis and degenerative disc disease with suspected foraminal impingement. Sigmoid colon diverticulosis. Several calcified granulomas in the left lower lobe. Electronically Signed   By: Van Clines M.D.   On: 01/05/2017 09:34    ASSESSMENT AND PLAN: This is a very pleasant 76 years old white female with recurrent non-small cell lung cancer, squamous cell carcinoma. The patient completed 6 cycles of systemic chemotherapy was carboplatin and paclitaxel and tolerated her treatment well except for fatigue as well as peripheral neuropathy. She had repeat CT scan of the chest, abdomen and pelvis performed recently. I personally and independently reviewed the scan and discuss the results with the patient today. Her scan showed no evidence for disease progression. I recommended for the patient to continue on observation for now with repeat CT scan of the chest in 3 months for restaging of her disease. 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 We'll continue to monitor for now and consider the patient for treatment with Lyrica or Neurontin if she has worsening in her condition. For the hypothyroidism and she is currently on levothyroxine. For hypertension she is currently on Toprol-XL The patient was advised to call immediately if she has any concerning symptoms in the interval. The patient voices understanding of current disease status and treatment options and is in agreement with the current care plan. All questions were answered. The patient knows to call the clinic with any  problems, questions or concerns. We can certainly see the patient much sooner if necessary. I spent 15 minutes counseling the patient face to face. The total time spent in the appointment was 25 minutes.  Disclaimer: This note was dictated with voice recognition software. Similar sounding words can inadvertently be transcribed and may not be corrected upon review.

## 2017-02-05 ENCOUNTER — Inpatient Hospital Stay (HOSPITAL_COMMUNITY)
Admission: EM | Admit: 2017-02-05 | Discharge: 2017-02-09 | DRG: 872 | Disposition: A | Discharge status: Home / Self Care | Payer: Medicare Other | Attending: Internal Medicine | Admitting: Internal Medicine

## 2017-02-05 ENCOUNTER — Encounter (HOSPITAL_COMMUNITY): Payer: Self-pay | Admitting: Emergency Medicine

## 2017-02-05 ENCOUNTER — Emergency Department (HOSPITAL_COMMUNITY): Payer: Medicare Other

## 2017-02-05 DIAGNOSIS — Z888 Allergy status to other drugs, medicaments and biological substances status: Secondary | ICD-10-CM

## 2017-02-05 DIAGNOSIS — K219 Gastro-esophageal reflux disease without esophagitis: Secondary | ICD-10-CM | POA: Diagnosis present

## 2017-02-05 DIAGNOSIS — G629 Polyneuropathy, unspecified: Secondary | ICD-10-CM | POA: Diagnosis not present

## 2017-02-05 DIAGNOSIS — E785 Hyperlipidemia, unspecified: Secondary | ICD-10-CM | POA: Diagnosis not present

## 2017-02-05 DIAGNOSIS — E739 Lactose intolerance, unspecified: Secondary | ICD-10-CM | POA: Diagnosis not present

## 2017-02-05 DIAGNOSIS — I080 Rheumatic disorders of both mitral and aortic valves: Secondary | ICD-10-CM | POA: Diagnosis not present

## 2017-02-05 DIAGNOSIS — K449 Diaphragmatic hernia without obstruction or gangrene: Secondary | ICD-10-CM | POA: Diagnosis present

## 2017-02-05 DIAGNOSIS — A419 Sepsis, unspecified organism: Secondary | ICD-10-CM | POA: Diagnosis not present

## 2017-02-05 DIAGNOSIS — E890 Postprocedural hypothyroidism: Secondary | ICD-10-CM | POA: Diagnosis present

## 2017-02-05 DIAGNOSIS — C3491 Malignant neoplasm of unspecified part of right bronchus or lung: Secondary | ICD-10-CM | POA: Diagnosis present

## 2017-02-05 DIAGNOSIS — I480 Paroxysmal atrial fibrillation: Secondary | ICD-10-CM | POA: Diagnosis present

## 2017-02-05 DIAGNOSIS — Z87891 Personal history of nicotine dependence: Secondary | ICD-10-CM | POA: Diagnosis not present

## 2017-02-05 DIAGNOSIS — J45909 Unspecified asthma, uncomplicated: Secondary | ICD-10-CM | POA: Diagnosis present

## 2017-02-05 DIAGNOSIS — J209 Acute bronchitis, unspecified: Secondary | ICD-10-CM

## 2017-02-05 DIAGNOSIS — M1990 Unspecified osteoarthritis, unspecified site: Secondary | ICD-10-CM | POA: Diagnosis not present

## 2017-02-05 DIAGNOSIS — C342 Malignant neoplasm of middle lobe, bronchus or lung: Secondary | ICD-10-CM | POA: Diagnosis not present

## 2017-02-05 DIAGNOSIS — K259 Gastric ulcer, unspecified as acute or chronic, without hemorrhage or perforation: Secondary | ICD-10-CM | POA: Diagnosis not present

## 2017-02-05 DIAGNOSIS — Z923 Personal history of irradiation: Secondary | ICD-10-CM

## 2017-02-05 DIAGNOSIS — R011 Cardiac murmur, unspecified: Secondary | ICD-10-CM | POA: Diagnosis present

## 2017-02-05 DIAGNOSIS — M5430 Sciatica, unspecified side: Secondary | ICD-10-CM | POA: Diagnosis not present

## 2017-02-05 DIAGNOSIS — K3189 Other diseases of stomach and duodenum: Secondary | ICD-10-CM | POA: Diagnosis present

## 2017-02-05 DIAGNOSIS — D509 Iron deficiency anemia, unspecified: Secondary | ICD-10-CM | POA: Diagnosis not present

## 2017-02-05 DIAGNOSIS — Z7982 Long term (current) use of aspirin: Secondary | ICD-10-CM

## 2017-02-05 DIAGNOSIS — Z79899 Other long term (current) drug therapy: Secondary | ICD-10-CM

## 2017-02-05 DIAGNOSIS — I10 Essential (primary) hypertension: Secondary | ICD-10-CM | POA: Diagnosis present

## 2017-02-05 DIAGNOSIS — Z801 Family history of malignant neoplasm of trachea, bronchus and lung: Secondary | ICD-10-CM | POA: Diagnosis not present

## 2017-02-05 DIAGNOSIS — J189 Pneumonia, unspecified organism: Secondary | ICD-10-CM | POA: Diagnosis not present

## 2017-02-05 DIAGNOSIS — E7800 Pure hypercholesterolemia, unspecified: Secondary | ICD-10-CM | POA: Diagnosis not present

## 2017-02-05 DIAGNOSIS — K922 Gastrointestinal hemorrhage, unspecified: Secondary | ICD-10-CM | POA: Diagnosis not present

## 2017-02-05 DIAGNOSIS — Z9221 Personal history of antineoplastic chemotherapy: Secondary | ICD-10-CM

## 2017-02-05 DIAGNOSIS — R05 Cough: Secondary | ICD-10-CM | POA: Diagnosis not present

## 2017-02-05 DIAGNOSIS — E860 Dehydration: Secondary | ICD-10-CM | POA: Diagnosis not present

## 2017-02-05 DIAGNOSIS — C3411 Malignant neoplasm of upper lobe, right bronchus or lung: Secondary | ICD-10-CM | POA: Diagnosis not present

## 2017-02-05 DIAGNOSIS — Z8249 Family history of ischemic heart disease and other diseases of the circulatory system: Secondary | ICD-10-CM

## 2017-02-05 DIAGNOSIS — K921 Melena: Secondary | ICD-10-CM | POA: Diagnosis not present

## 2017-02-05 DIAGNOSIS — R509 Fever, unspecified: Secondary | ICD-10-CM | POA: Diagnosis not present

## 2017-02-05 DIAGNOSIS — R195 Other fecal abnormalities: Secondary | ICD-10-CM | POA: Diagnosis present

## 2017-02-05 DIAGNOSIS — C3490 Malignant neoplasm of unspecified part of unspecified bronchus or lung: Secondary | ICD-10-CM | POA: Diagnosis present

## 2017-02-05 DIAGNOSIS — Z880 Allergy status to penicillin: Secondary | ICD-10-CM

## 2017-02-05 DIAGNOSIS — D500 Iron deficiency anemia secondary to blood loss (chronic): Secondary | ICD-10-CM | POA: Diagnosis not present

## 2017-02-05 DIAGNOSIS — D649 Anemia, unspecified: Secondary | ICD-10-CM | POA: Diagnosis not present

## 2017-02-05 LAB — COMPREHENSIVE METABOLIC PANEL
ALBUMIN: 3.4 g/dL — AB (ref 3.5–5.0)
ALK PHOS: 93 U/L (ref 38–126)
ALT: 18 U/L (ref 14–54)
AST: 23 U/L (ref 15–41)
Anion gap: 10 (ref 5–15)
BILIRUBIN TOTAL: 1 mg/dL (ref 0.3–1.2)
BUN: 22 mg/dL — AB (ref 6–20)
CALCIUM: 8.9 mg/dL (ref 8.9–10.3)
CO2: 25 mmol/L (ref 22–32)
Chloride: 103 mmol/L (ref 101–111)
Creatinine, Ser: 1.06 mg/dL — ABNORMAL HIGH (ref 0.44–1.00)
GFR calc Af Amer: 59 mL/min — ABNORMAL LOW (ref >60)
GFR, EST NON AFRICAN AMERICAN: 51 mL/min — AB (ref >60)
GLUCOSE: 132 mg/dL — AB (ref 65–99)
Potassium: 3.3 mmol/L — ABNORMAL LOW (ref 3.5–5.1)
Sodium: 138 mmol/L (ref 135–145)
TOTAL PROTEIN: 7.1 g/dL (ref 6.5–8.1)

## 2017-02-05 LAB — CBC WITH DIFFERENTIAL/PLATELET
BASOS ABS: 0 10*3/uL (ref 0.0–0.1)
BASOS PCT: 0 %
Eosinophils Absolute: 0.1 10*3/uL (ref 0.0–0.7)
Eosinophils Relative: 1 %
HEMATOCRIT: 24.6 % — AB (ref 36.0–46.0)
HEMOGLOBIN: 8.6 g/dL — AB (ref 12.0–15.0)
LYMPHS PCT: 6 %
Lymphs Abs: 1 10*3/uL (ref 0.7–4.0)
MCH: 35.2 pg — ABNORMAL HIGH (ref 26.0–34.0)
MCHC: 35 g/dL (ref 30.0–36.0)
MCV: 100.8 fL — AB (ref 78.0–100.0)
MONOS PCT: 5 %
Monocytes Absolute: 0.8 10*3/uL (ref 0.1–1.0)
NEUTROS ABS: 13.9 10*3/uL — AB (ref 1.7–7.7)
NEUTROS PCT: 88 %
Platelets: 219 10*3/uL (ref 150–400)
RBC: 2.44 MIL/uL — ABNORMAL LOW (ref 3.87–5.11)
RDW: 14.4 % (ref 11.5–15.5)
WBC: 15.8 10*3/uL — ABNORMAL HIGH (ref 4.0–10.5)

## 2017-02-05 MED ORDER — MONTELUKAST SODIUM 10 MG PO TABS
10.0000 mg | ORAL_TABLET | Freq: Every day | ORAL | Status: DC
  Administered 2017-02-05 – 2017-02-08 (×4): 10 mg via ORAL
  Filled 2017-02-05 (×5): qty 1

## 2017-02-05 MED ORDER — CHLORTHALIDONE 25 MG PO TABS: 25.0000 mg | ORAL_TABLET | Freq: Every day | ORAL | Status: DC

## 2017-02-05 MED ORDER — ACETAMINOPHEN 650 MG RE SUPP: 650.0000 mg | Freq: Four times a day (QID) | RECTAL | Status: DC | PRN

## 2017-02-05 MED ORDER — ENOXAPARIN SODIUM 40 MG/0.4ML ~~LOC~~ SOLN
40.0000 mg | SUBCUTANEOUS | Status: DC
  Administered 2017-02-05 – 2017-02-06 (×2): 40 mg via SUBCUTANEOUS
  Filled 2017-02-05 (×3): qty 0.4

## 2017-02-05 MED ORDER — CEFEPIME HCL 1 G IJ SOLR: 1.0000 g | INTRAMUSCULAR | Status: DC

## 2017-02-05 MED ORDER — SODIUM CHLORIDE 0.9 % IV SOLN: 250.0000 mL | INTRAVENOUS | Status: DC | PRN

## 2017-02-05 MED ORDER — BENZONATATE 100 MG PO CAPS
100.0000 mg | ORAL_CAPSULE | Freq: Three times a day (TID) | ORAL | Status: DC
  Administered 2017-02-05 – 2017-02-09 (×12): 100 mg via ORAL
  Filled 2017-02-05 (×12): qty 1

## 2017-02-05 MED ORDER — SODIUM CHLORIDE 0.9% FLUSH: 3.0000 mL | INTRAVENOUS | Status: DC | PRN

## 2017-02-05 MED ORDER — ONDANSETRON HCL 4 MG PO TABS: 4.0000 mg | ORAL_TABLET | Freq: Four times a day (QID) | ORAL | Status: DC | PRN

## 2017-02-05 MED ORDER — LEVOTHYROXINE SODIUM 75 MCG PO TABS
150.0000 ug | ORAL_TABLET | ORAL | Status: DC
  Administered 2017-02-06 – 2017-02-08 (×2): 150 ug via ORAL
  Filled 2017-02-05 (×2): qty 2

## 2017-02-05 MED ORDER — AZITHROMYCIN 250 MG PO TABS: 500.0000 mg | ORAL_TABLET | Freq: Every day | ORAL | Status: DC

## 2017-02-05 MED ORDER — LOPERAMIDE HCL 2 MG PO CAPS
2.0000 mg | ORAL_CAPSULE | ORAL | Status: DC | PRN
  Administered 2017-02-05: 2 mg via ORAL
  Filled 2017-02-05: qty 1

## 2017-02-05 MED ORDER — ONDANSETRON HCL 4 MG/2ML IJ SOLN
4.0000 mg | Freq: Four times a day (QID) | INTRAMUSCULAR | Status: DC | PRN
  Administered 2017-02-05: 4 mg via INTRAVENOUS
  Filled 2017-02-05: qty 2

## 2017-02-05 MED ORDER — AZITHROMYCIN 250 MG PO TABS
500.0000 mg | ORAL_TABLET | Freq: Every day | ORAL | Status: DC
  Administered 2017-02-06 – 2017-02-08 (×3): 500 mg via ORAL
  Filled 2017-02-05 (×3): qty 2

## 2017-02-05 MED ORDER — VANCOMYCIN HCL IN DEXTROSE 1-5 GM/200ML-% IV SOLN
1000.0000 mg | Freq: Once | INTRAVENOUS | Status: AC
  Administered 2017-02-05: 1000 mg via INTRAVENOUS
  Filled 2017-02-05: qty 200

## 2017-02-05 MED ORDER — METOPROLOL SUCCINATE ER 25 MG PO TB24
12.5000 mg | ORAL_TABLET | Freq: Every day | ORAL | Status: DC
  Administered 2017-02-07 – 2017-02-09 (×3): 12.5 mg via ORAL
  Filled 2017-02-05 (×3): qty 1

## 2017-02-05 MED ORDER — ATORVASTATIN CALCIUM 20 MG PO TABS
20.0000 mg | ORAL_TABLET | Freq: Every day | ORAL | Status: DC
  Administered 2017-02-06 – 2017-02-09 (×4): 20 mg via ORAL
  Filled 2017-02-05 (×3): qty 1
  Filled 2017-02-05: qty 2

## 2017-02-05 MED ORDER — ALBUTEROL SULFATE (2.5 MG/3ML) 0.083% IN NEBU
2.5000 mg | INHALATION_SOLUTION | RESPIRATORY_TRACT | Status: DC | PRN
  Administered 2017-02-06 – 2017-02-07 (×2): 2.5 mg via RESPIRATORY_TRACT
  Filled 2017-02-05 (×3): qty 3

## 2017-02-05 MED ORDER — LEVOFLOXACIN 500 MG PO TABS: 500.0000 mg | ORAL_TABLET | Freq: Once | ORAL | Status: DC

## 2017-02-05 MED ORDER — DM-GUAIFENESIN ER 30-600 MG PO TB12
1.0000 | ORAL_TABLET | Freq: Two times a day (BID) | ORAL | Status: DC
  Administered 2017-02-05 – 2017-02-09 (×8): 1 via ORAL
  Filled 2017-02-05 (×8): qty 1

## 2017-02-05 MED ORDER — SODIUM CHLORIDE 0.9 % IV BOLUS (SEPSIS)
1000.0000 mL | Freq: Once | INTRAVENOUS | Status: AC
  Administered 2017-02-05: 1000 mL via INTRAVENOUS

## 2017-02-05 MED ORDER — DEXTROSE 5 % IV SOLN
2.0000 g | Freq: Once | INTRAVENOUS | Status: AC
  Administered 2017-02-05: 2 g via INTRAVENOUS
  Filled 2017-02-05: qty 2

## 2017-02-05 MED ORDER — AZITHROMYCIN 250 MG PO TABS
500.0000 mg | ORAL_TABLET | Freq: Once | ORAL | Status: AC
  Administered 2017-02-05: 500 mg via ORAL
  Filled 2017-02-05: qty 2

## 2017-02-05 MED ORDER — IPRATROPIUM-ALBUTEROL 0.5-2.5 (3) MG/3ML IN SOLN
3.0000 mL | Freq: Once | RESPIRATORY_TRACT | Status: AC
  Administered 2017-02-05: 3 mL via RESPIRATORY_TRACT
  Filled 2017-02-05: qty 3

## 2017-02-05 MED ORDER — ACETAMINOPHEN 325 MG PO TABS
650.0000 mg | ORAL_TABLET | Freq: Four times a day (QID) | ORAL | Status: DC | PRN
  Administered 2017-02-05 – 2017-02-06 (×2): 650 mg via ORAL
  Filled 2017-02-05 (×2): qty 2

## 2017-02-05 MED ORDER — SODIUM CHLORIDE 0.9% FLUSH: 3.0000 mL | Freq: Two times a day (BID) | INTRAVENOUS | Status: DC

## 2017-02-05 MED ORDER — VANCOMYCIN HCL IN DEXTROSE 1-5 GM/200ML-% IV SOLN: 1000.0000 mg | INTRAVENOUS | Status: DC

## 2017-02-05 MED ORDER — POTASSIUM CHLORIDE CRYS ER 20 MEQ PO TBCR
40.0000 meq | EXTENDED_RELEASE_TABLET | Freq: Once | ORAL | Status: DC
  Filled 2017-02-05: qty 2

## 2017-02-05 NOTE — ED Provider Notes (Signed)
[facility] DEPT Provider Note   CSN: [id number] Arrival date & time: 02/05/17  1207     History   Chief Complaint Chief Complaint  Patient presents with  . PNA    HPI Kimberly [name] is a 76 y.o. female.  Patient with a known diagnosis of lung cancer presents with fever and chills starting Friday with a maximum temperature of 102. She just finished her second round of chemotherapy and has had radiation treatment in the past. Patient was seen by her primary care doctor today and a chest x-ray was obtained. She was told that the x-ray showed pneumonia and was encouraged to go to the emergency department. She feels weak and unable to care for self.      Past Medical History:  Diagnosis Date  . Allergic asthma without acute exacerbation or status asthmaticus   . Aortic insufficiency   . Arthritis   . Atrial fibrillation ([place])   . Back pain 08/14/2016  . Bronchitis   . Cough    dry, endobronchial mass  . Dyslipidemia   . Family history of adverse reaction to anesthesia    pts son also experiences N/V  . GERD (gastroesophageal reflux disease)   . Heart murmur   . History of blood transfusion   . History of bronchitis   . History of chemotherapy   . History of nuclear stress test 02/26/2006   exercise myoview; normal pattern of perfusion; low risk scan   . Hypertension   . Hypothyroidism   . Mitral insufficiency   . Pneumonia   . PONV (postoperative nausea and vomiting)   . Radiation 12/01/13-01/08/14   50.4 gray to right central chest  . Shortness of breath    with exertion  . Squamous cell carcinoma of lung ([place]   . Syncope    "states she has passed out a few times. dr is trying to find cause.last time when geeting ready to go home after video bronch/bx  . Thyroid disease     Patient Active Problem List   Diagnosis Date Noted  . Antineoplastic chemotherapy induced anemia 11/20/2016  . Insect bites 10/02/2016  . Chemotherapy induced neutropenia ([place])  09/26/2016  . Community acquired pneumonia of right upper lobe of lung ([place])   . Malignant neoplasm of right lung ([place])   . Nausea with vomiting   . Hypomagnesemia   . C. difficile colitis 08/19/2016  . Hypokalemia 08/19/2016  . Dehydration 08/18/2016  . Encounter for antineoplastic chemotherapy 08/14/2016  . Back pain 08/14/2016  . Poor venous access 08/14/2016  . Other fatigue 05/03/2016  . Patient unable to exercise 05/03/2016  . Symptomatic anemia 04/02/2014  . Diarrhea 02/27/2014  . Hyponatremia 02/27/2014  . Anemia 02/27/2014  . Atrial fibrillation with rapid ventricular response ([place]) 02/07/2014    Class: Acute  . Pleuritic chest pain 02/07/2014  . Paroxysmal atrial fibrillation ([place]) 02/07/2014  . Colitis 01/11/2014  . Bloody diarrhea 01/11/2014  . Lung cancer, middle lobe ([place]) 11/13/2013  . Hemoptysis 09/19/2013  . Collapse of right lung: RML 09/19/2013  . Bronchial obstruction 09/19/2013  . Leukocytosis 09/19/2013  . Obstructive pneumonia 09/19/2013  . Amaurosis fugax 05/23/2013  . Syncope 04/19/2013  . Hyperlipidemia 08/17/2008  . Essential hypertension 08/17/2008  . ALLERGIC RHINITIS 08/17/2008  . Asthma 08/17/2008  . SHORTNESS OF BREATH (SOB) 08/17/2008    Past Surgical History:  Procedure Laterality Date  . CARDIAC CATHETERIZATION  07/08/2008   normal coronaries  . CARPAL TUNNEL RELEASE Right 1988  .  COLONOSCOPY N/A 02/11/2016   Procedure: COLONOSCOPY;  Surgeon: Carol Ada, MD;  Location: [facility];  Service: Endoscopy;  Laterality: N/A;  . DILATION AND CURETTAGE OF UTERUS    . EYE SURGERY Bilateral   . H/O MET Test w/PFT  04/02/2012   low risk; peak VO2 77% predicted  . IR GENERIC HISTORICAL  09/12/2016   IR FLUORO GUIDE PORT INSERTION RIGHT 09/12/2016 [name], MD [facility]  . IR GENERIC HISTORICAL  09/12/2016   IR US GUIDE VASC ACCESS RIGHT 09/12/2016 [name], MD [facility]  . JOINT REPLACEMENT  2003   thumb rt  . KNEE  ARTHROSCOPY  12   rt meniscus  . MEDIASTINOSCOPY N/A 10/30/2013   Procedure: MEDIASTINOSCOPY;  Surgeon: Melrose Nakayama, MD;  Location: [place];  Service: Thoracic;  Laterality: N/A;  . ROTATOR CUFF REPAIR  2006   ? side  . THYROIDECTOMY  1973  . TRANSTHORACIC ECHOCARDIOGRAM  09/25/2012   EF 55-60%; mild LVH & mild concentric hypertrophy; mild AV regurg; RV systolic pressure increase consistent with mild pulm HTN  . VIDEO ASSISTED THORACOSCOPY (VATS)/ LOBECTOMY Right 11/13/2013   Procedure: VIDEO ASSISTED THORACOSCOPY (VATS) with mediastinal  biopsies;  Surgeon: Melrose Nakayama, MD;  Location: [place];  Service: Thoracic;  Laterality: Right;  RIGHT VATS,mediastinal biopsies  . VIDEO BRONCHOSCOPY Bilateral 09/25/2013   Procedure: VIDEO BRONCHOSCOPY WITHOUT FLUORO;  Surgeon: Tanda Rockers, MD;  [facility];  Service: Cardiopulmonary;  Laterality: Bilateral;  . VIDEO BRONCHOSCOPY WITH ENDOBRONCHIAL ULTRASOUND N/A 10/30/2013   Procedure: VIDEO BRONCHOSCOPY WITH ENDOBRONCHIAL ULTRASOUND;  Surgeon: Melrose Nakayama, MD;  Location: [place];  Service: Thoracic;  Laterality: N/A;    OB History    No data available       Home Medications    Prior to Admission medications   Medication Sig Start Date End Date Taking? Authorizing Provider  albuterol (PROVENTIL HFA;VENTOLIN HFA) 108 (90 BASE) MCG/ACT inhaler Inhale 1 puff into the lungs every 6 (six) hours as needed for wheezing or shortness of breath.    Yes [provider]  aspirin EC 81 MG tablet Take 81 mg by mouth daily with breakfast.    Yes [provider]  atorvastatin (LIPITOR) 20 MG tablet Take 20 mg by mouth every morning.    Yes [provider]  chlorthalidone (HYGROTON) 25 MG tablet TAKE 1 TABLET BY MOUTH  DAILY 10/05/16  Yes Hilty, Nadean Corwin, MD  ibuprofen (ADVIL,MOTRIN) 200 MG tablet Take 200-400 mg by mouth every 4 (four) hours as needed for fever, headache, mild pain, moderate pain or  cramping.   Yes [provider]  levothyroxine (SYNTHROID, LEVOTHROID) 150 MCG tablet Take 150 mcg by mouth as directed. Take 1 tablet once every Tuesday, Thursday, Saturday, and Sunday.   Yes [provider]  lidocaine-prilocaine (EMLA) cream Apply 1 application topically as needed. Patient taking differently: Apply 1 application topically as needed (for port access).  08/18/16  Yes Susanne Borders, NP  loperamide (IMODIUM) 2 MG capsule Take 2 mg by mouth as needed for diarrhea or loose stools.   Yes [provider]  metoprolol succinate (TOPROL-XL) 25 MG 24 hr tablet Take 12.5 mg by mouth daily with breakfast.    Yes [provider]  montelukast (SINGULAIR) 10 MG tablet Take 10 mg by mouth at bedtime.  04/15/13  Yes [provider]  Multiple Vitamins-Iron (MULTIVITAMIN/IRON PO) Take 1 tablet by mouth daily with breakfast.    Yes [provider]    Family History Family History  Problem Relation Age of Onset  . Lung cancer Mother   . Heart attack Father   . Heart failure Maternal Grandfather   . Heart attack Paternal Grandfather     Social History Social History  Substance Use Topics  . Smoking status: Former Smoker    Packs/day: 0.50    Years: 6.00    Types: Cigarettes    Quit date: 09/05/1967  . Smokeless tobacco: Never Used     Comment: occ wine  . Alcohol use Yes     Comment: occasional 1-2 drinks/month     Allergies   Lactose intolerance (gi); Amiodarone; Augmentin [amoxicillin-pot clavulanate]; and Milk-related compounds   Review of Systems Review of Systems  All other systems reviewed and are negative.    Physical Exam Updated Vital Signs BP (!) 148/66 (BP Location: Left Arm)   Pulse (!) 105   Temp 98.6 F (37 C) (Oral)   Resp 16   Wt 71.7 kg (158 lb)   SpO2 100%   BMI 33.02 kg/m   Physical Exam  Constitutional: She is oriented to person, place, and time.  Alert, pale, no obvious dyspnea  HENT:    Head: Normocephalic and atraumatic.  Eyes: Conjunctivae are normal.  Neck: Neck supple.  Cardiovascular: Normal rate and regular rhythm.   Pulmonary/Chest: Effort normal and breath sounds normal.  Decreased breath sounds on right.  Abdominal: Soft. Bowel sounds are normal.  Musculoskeletal: Normal range of motion.  Neurological: She is alert and oriented to person, place, and time.  Skin: Skin is warm and dry.  Psychiatric: She has a normal mood and affect. Her behavior is normal.  Nursing note and vitals reviewed.    ED Treatments / Results  Labs (all labs ordered are listed, but only abnormal results are displayed) Labs Reviewed  CULTURE, BLOOD (ROUTINE X 2)  CULTURE, BLOOD (ROUTINE X 2)  CBC WITH DIFFERENTIAL/PLATELET  COMPREHENSIVE METABOLIC PANEL    EKG  EKG Interpretation None       Radiology Dg Chest 2 View  Result Date: 02/05/2017 CLINICAL DATA:  Cough, fever, and upper back pain for the past 3 days. Clinical diagnosis of pneumonia. Currently undergoing therapy for lung malignancy. History of asthma, atrial fibrillation, hypertension, and valvular heart disease. EXAM: CHEST  2 VIEW COMPARISON:  Chest x-ray of February 05, 2017 at 10:16 a.m. FINDINGS: The left lung is adequately inflated and clear. On the right there is mild volume loss. Patchy density in the mid and lower lung is present with partial obscuration of the hemidiaphragm. Deviation of the trachea toward the right is stable. The power port catheter tip projects over the junction of the proximal and midportions of the SVC. There surgical clips in the left paratracheal region. There is mild multilevel degenerative disc disease of the thoracic spine. IMPRESSION: Stable chronic changes in the right lung. No acute pneumonia nor CHF nor other acute cardiopulmonary abnormality. Electronically Signed   By: David  Martinique M.D.   On: 02/05/2017 14:28    Procedures Procedures (including critical care time)  Medications  Ordered in ED Medications  sodium chloride 0.9 % bolus 1,000 mL (not administered)  vancomycin (VANCOCIN) IVPB 1000 mg/200 mL premix (not administered)  ceFEPIme (MAXIPIME) 2 g in dextrose 5 % 50 mL IVPB (not administered)     Initial Impression / Assessment and Plan / ED Course  I have reviewed the triage vital signs and the nursing notes.  Pertinent labs &  imaging results that were available during my care of the patient were reviewed by me and considered in my medical decision making (see chart for details).     Chest x-ray does not show obvious pneumonia. However,  the pneumonia could be obscured by her tumor. Patient does not feel comfortable going home. Will order blood cultures and IV antibiotics.  Discussed with my colleague Dr Maryruth Bun.  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

## 2017-02-05 NOTE — ED Notes (Signed)
Provided Chicken Noodle Soup with permission from provider.

## 2017-02-05 NOTE — ED Notes (Signed)
Bed: KP22 Expected date:  Expected time:  Means of arrival:  Comments: Triage 2

## 2017-02-05 NOTE — Progress Notes (Signed)
Pharmacy Antibiotic Note  See previous note from Reuel Boom, PharmD for full details. In brief, Pharmacy consulted for vancomycin and cefepime dosing for pneumonia.  Cefepime 2g IV x 1 and Vancomycin 1g IV x 1 ordered; awaiting results of SCr to determine maintenance doses.  SCr result = 1.06, CrCl = 39 ml/min  Plan:  Continue Cefepime 1g IV q24h  Continue Vancomycin 1g IV q24h, Check trough at steady state, goal 15-20 mcg/ml  Follow up renal function & cultures  Peggyann Juba, PharmD, BCPS Pager: [phone] 02/05/2017 4:58 PM

## 2017-02-05 NOTE — ED Notes (Signed)
Dr. Lindajo Royal at bedside to talk about options for disposition.

## 2017-02-05 NOTE — ED Triage Notes (Signed)
Per per patient, states upper back pain since last Friday-states she went to PCP and had chest xray done-shows pneumonia-sent here for further eval-currently on chemo

## 2017-02-05 NOTE — H&P (Signed)
History and Physical    Kimberly [name]:[id number] DOB: 08-07-1941 DOA: 02/05/2017    PCP: Deland Pretty, MD  Patient coming from: home  Chief Complaint: cough  HPI: Kimberly [name] is a 76 y.o. female with medical history of non-small cell lung CA originally diagnosed in 2015 s/p chemoradiation with currently stable disease after 6 cycles of systemic chemo with Carboplatin and Paclitaxel. 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  She presents today for a cough which has been going on for about 1 wk now. Sputum is brown. She has had a fever of 102 and has right upper back discomfort which is worse with coughing. She has clear nasal drainage as well. She had a respiratory infection in Dec 2017 and developed c diff colitis from the antibiotics and required a hospital admission.   ED Course:  K 3.3, WBC count 15.8, Hb 8.6 Pulse ox on room air is 99-100%.   Review of Systems:  Fatigue, dyspnea on exertion and loss of appetite.  All other systems reviewed and apart from HPI, are negative.  Past Medical History:  Diagnosis Date  . Allergic asthma without acute exacerbation or status asthmaticus   . Aortic insufficiency   . Arthritis   . Atrial fibrillation ([place])   . Back pain 08/14/2016  . Bronchitis   . Cough    dry, endobronchial mass  . Dyslipidemia   . Family history of adverse reaction to anesthesia    pts son also experiences N/V  . GERD (gastroesophageal reflux disease)   . Heart murmur   . History of blood transfusion   . History of bronchitis   . History of chemotherapy   . History of nuclear stress test 02/26/2006   exercise myoview; normal pattern of perfusion; low risk scan   . Hypertension   . Hypothyroidism   . Mitral insufficiency   . Pneumonia   . PONV (postoperative nausea and vomiting)   . Radiation 12/01/13-01/08/14   50.4 gray to right central chest  . Shortness  of breath    with exertion  . Squamous cell carcinoma of lung ([place])   . Syncope    "states she has passed out a few times. dr is trying to find cause.last time when geeting ready to go home after video bronch/bx  . Thyroid disease     Past Surgical History:  Procedure Laterality Date  . CARDIAC CATHETERIZATION  07/08/2008   normal coronaries  . CARPAL TUNNEL RELEASE Right 1988  . COLONOSCOPY N/A 02/11/2016   Procedure: COLONOSCOPY;  Surgeon: Carol Ada, MD;  [facility];  Service: Endoscopy;  Laterality: N/A;  . DILATION AND CURETTAGE OF UTERUS    . EYE SURGERY Bilateral   . H/O MET Test w/PFT  04/02/2012   low risk; peak VO2 77% predicted  . IR GENERIC HISTORICAL  09/12/2016   IR FLUORO GUIDE PORT INSERTION RIGHT 09/12/2016 [name], MD [facility]  . IR GENERIC HISTORICAL  09/12/2016   IR US GUIDE VASC ACCESS RIGHT 09/12/2016 [name], MD [facility]  . JOINT REPLACEMENT  2003   thumb rt  . KNEE ARTHROSCOPY  12   rt meniscus  . MEDIASTINOSCOPY N/A 10/30/2013   Procedure: MEDIASTINOSCOPY;  Surgeon: Melrose Nakayama, MD;  Location: [place];  Service: Thoracic;  Laterality: N/A;  . ROTATOR CUFF REPAIR  2006   ? side  . THYROIDECTOMY  1973  .  TRANSTHORACIC ECHOCARDIOGRAM  09/25/2012   EF 55-60%; mild LVH & mild concentric hypertrophy; mild AV regurg; RV systolic pressure increase consistent with mild pulm HTN  . VIDEO ASSISTED THORACOSCOPY (VATS)/ LOBECTOMY Right 11/13/2013   Procedure: VIDEO ASSISTED THORACOSCOPY (VATS) with mediastinal  biopsies;  Surgeon: Melrose Nakayama, MD;  Location: [place];  Service: Thoracic;  Laterality: Right;  RIGHT VATS,mediastinal biopsies  . VIDEO BRONCHOSCOPY Bilateral 09/25/2013   Procedure: VIDEO BRONCHOSCOPY WITHOUT FLUORO;  Surgeon: Tanda Rockers, MD;  [facility];  Service: Cardiopulmonary;  Laterality: Bilateral;  . VIDEO BRONCHOSCOPY WITH ENDOBRONCHIAL ULTRASOUND N/A 10/30/2013   Procedure: VIDEO  BRONCHOSCOPY WITH ENDOBRONCHIAL ULTRASOUND;  Surgeon: Melrose Nakayama, MD;  Location: [place];  Service: Thoracic;  Laterality: N/A;    Social History:   reports that she quit smoking about 49 years ago. Her smoking use included Cigarettes. She has a 3.00 pack-year smoking history. She has never used smokeless tobacco. She reports that she drinks alcohol. She reports that she does not use drugs.  Allergies  Allergen Reactions  . Lactose Intolerance (Gi) Other (See Comments)  . Amiodarone Other (See Comments)    Corneal deposits  . Augmentin [Amoxicillin-Pot Clavulanate] Diarrhea    *ended up with cdiff* Has patient had a PCN reaction causing immediate rash, facial/tongue/throat swelling, SOB or lightheadedness with hypotension: No Has patient had a PCN reaction causing severe rash involving mucus membranes or skin necrosis: No Has patient had a PCN reaction that required hospitalization: Yes Has patient had a PCN reaction occurring within the last 10 years: Yes If all of the above answers are "NO", then may proceed with Cephalosporin use.   . Milk-Related Compounds Other (See Comments)    GI upset, projectile vomiting - can't take any dairy products    Family History  Problem Relation Age of Onset  . Lung cancer Mother   . Heart attack Father   . Heart failure Maternal Grandfather   . Heart attack Paternal Grandfather      Prior to Admission medications   Medication Sig Start Date End Date Taking? Authorizing Provider  albuterol (PROVENTIL HFA;VENTOLIN HFA) 108 (90 BASE) MCG/ACT inhaler Inhale 1 puff into the lungs every 6 (six) hours as needed for wheezing or shortness of breath.    Yes [provider]  aspirin EC 81 MG tablet Take 81 mg by mouth daily with breakfast.    Yes [provider]  atorvastatin (LIPITOR) 20 MG tablet Take 20 mg by mouth every morning.    Yes [provider]  chlorthalidone (HYGROTON) 25 MG tablet TAKE 1 TABLET BY MOUTH   DAILY 10/05/16  Yes Hilty, Nadean Corwin, MD  ibuprofen (ADVIL,MOTRIN) 200 MG tablet Take 200-400 mg by mouth every 4 (four) hours as needed for fever, headache, mild pain, moderate pain or cramping.   Yes [provider]  levothyroxine (SYNTHROID, LEVOTHROID) 150 MCG tablet Take 150 mcg by mouth as directed. Take 1 tablet once every Tuesday, Thursday, Saturday, and Sunday.   Yes [provider]  lidocaine-prilocaine (EMLA) cream Apply 1 application topically as needed. Patient taking differently: Apply 1 application topically as needed (for port access).  08/18/16  Yes Susanne Borders, NP  loperamide (IMODIUM) 2 MG capsule Take 2 mg by mouth as needed for diarrhea or loose stools.   Yes [provider]  metoprolol succinate (TOPROL-XL) 25 MG 24 hr tablet Take 12.5 mg by mouth daily with breakfast.    Yes [provider]  montelukast (  SINGULAIR) 10 MG tablet Take 10 mg by mouth at bedtime.  04/15/13  Yes [provider]  Multiple Vitamins-Iron (MULTIVITAMIN/IRON PO) Take 1 tablet by mouth daily with breakfast.    Yes [provider]    Physical Exam: Wt Readings from Last 3 Encounters:  02/05/17 71.7 kg (158 lb)  01/09/17 71.8 kg (158 lb 3.2 oz)  01/01/17 71 kg (156 lb 9.6 oz)   Vitals:   02/05/17 1422 02/05/17 1445 02/05/17 1611 02/05/17 1804  BP:  (!) 148/66 (!) 141/71 (!) 148/66  Pulse:  (!) 105 95 86  Resp:  _0 Temp:   98.9 F (37.2 C)   TempSrc:   Oral   SpO2:  100% 100% 98%  Weight: 71.7 kg (158 lb)         Constitutional: NAD, calm, comfortable Eyes: PERTLA, lids and conjunctivae normal ENMT: Mucous membranes are moist. Posterior pharynx clear of any exudate or lesions. Normal dentition.  Neck: normal, supple, no masses, no thyromegaly Respiratory: clear to auscultation bilaterally, no wheezing, no crackles. Normal respiratory effort. No accessory muscle use.  Cardiovascular: S1 & S2 heard, regular rate and rhythm, no  murmurs / rubs / gallops. No extremity edema. 2+ pedal pulses. No carotid bruits.  Abdomen: No distension, no tenderness, no masses palpated. No hepatosplenomegaly. Bowel sounds normal.  Musculoskeletal: no clubbing / cyanosis. No joint deformity upper and lower extremities. Good ROM, no contractures. Normal muscle tone.  Skin: no rashes, lesions, ulcers. No induration Neurologic: CN 2-12 grossly intact. Sensation intact, DTR normal. Strength 5/5 in all 4 limbs.  Psychiatric: Normal judgment and insight. Alert and oriented x 3. Normal mood.     Labs on Admission: I have personally reviewed following labs and imaging studies  CBC:  Recent Labs Lab 02/05/17 1348  WBC 15.8*  NEUTROABS 13.9*  HGB 8.6*  HCT 24.6*  MCV 100.8*  PLT 341   Basic Metabolic Panel:  Recent Labs Lab 02/05/17 1348  NA 138  K 3.3*  CL 103  CO2 25  GLUCOSE 132*  BUN 22*  CREATININE 1.06*  CALCIUM 8.9   GFR: Estimated Creatinine Clearance: 39.7 mL/min (A) (by C-G formula based on SCr of 1.06 mg/dL (H)). Liver Function Tests:  Recent Labs Lab 02/05/17 1348  AST 23  ALT 18  ALKPHOS 93  BILITOT 1.0  PROT 7.1  ALBUMIN 3.4*   No results for input(s): LIPASE, AMYLASE in the last 168 hours. No results for input(s): AMMONIA in the last 168 hours. Coagulation Profile: No results for input(s): INR, PROTIME in the last 168 hours. Cardiac Enzymes: No results for input(s): CKTOTAL, CKMB, CKMBINDEX, TROPONINI in the last 168 hours. BNP (last 3 results) No results for input(s): PROBNP in the last 8760 hours. HbA1C: No results for input(s): HGBA1C in the last 72 hours. CBG: No results for input(s): GLUCAP in the last 168 hours. Lipid Profile: No results for input(s): CHOL, HDL, LDLCALC, TRIG, CHOLHDL, LDLDIRECT in the last 72 hours. Thyroid Function Tests: No results for input(s): TSH, T4TOTAL, FREET4, T3FREE, THYROIDAB in the last 72 hours. Anemia Panel: No results for input(s): VITAMINB12,  FOLATE, FERRITIN, TIBC, IRON, RETICCTPCT in the last 72 hours. Urine analysis:    Component Value Date/Time   COLORURINE YELLOW 08/19/2016 [place] 08/19/2016 1546   LABSPEC 1.005 09/26/2016 1611   PHURINE 6.0 09/26/2016 1611   PHURINE 5.0 08/19/2016 1546   GLUCOSEU Negative 09/26/2016 1611   HGBUR Negative 09/26/2016 1611  HGBUR SMALL (A) 08/19/2016 1546   BILIRUBINUR Negative 09/26/2016 1611   KETONESUR Negative 09/26/2016 1611   KETONESUR NEGATIVE 08/19/2016 1546   PROTEINUR Negative 09/26/2016 1611   PROTEINUR 30 (A) 08/19/2016 1546   UROBILINOGEN 0.2 09/26/2016 1611   NITRITE Negative 09/26/2016 1611   NITRITE NEGATIVE 08/19/2016 1546   LEUKOCYTESUR Negative 09/26/2016 1611   Sepsis Labs: _0 (procalcitonin:4,lacticidven:4) )No results found for this or any previous visit (from the past 240 hour(s)).   Radiological Exams on Admission: Dg Chest 2 View  Result Date: 02/05/2017 CLINICAL DATA:  Cough, fever, and upper back pain for the past 3 days. Clinical diagnosis of pneumonia. Currently undergoing therapy for lung malignancy. History of asthma, atrial fibrillation, hypertension, and valvular heart disease. EXAM: CHEST  2 VIEW COMPARISON:  Chest x-ray of February 05, 2017 at 10:16 a.m. FINDINGS: The left lung is adequately inflated and clear. On the right there is mild volume loss. Patchy density in the mid and lower lung is present with partial obscuration of the hemidiaphragm. Deviation of the trachea toward the right is stable. The power port catheter tip projects over the junction of the proximal and midportions of the SVC. There surgical clips in the left paratracheal region. There is mild multilevel degenerative disc disease of the thoracic spine. IMPRESSION: Stable chronic changes in the right lung. No acute pneumonia nor CHF nor other acute cardiopulmonary abnormality. Electronically Signed   By: David  Martinique M.D.   On: 02/05/2017 14:28       Assessment/Plan Principal Problem:   Acute bronchitis - d/c Vanc and Zosyn- will start Z pak, PRN Nebs, Tessalon and Mucinex DM  Active Problems: Sepsis - fever 102, WBC 15.8 - due to above    Essential hypertension - hold Chlorthalidone cont Toprol    Malignant neoplasm of right lung  - s/p chemo completed in April- apparently disease is stable and she is to be followed with CT scans every 3 months.    Hypothyroid - cont synthroid    DVT prophylaxis: Lovenox  Code Status: Full code  Family Communication:   Disposition Plan: home when stable  Consults called:   Admission status:     Debbe Odea MD [facility] Pager: [email or web address] Password [email or web address] 7PM-7AM, please contact night-coverage   02/05/2017, 6:12 PM

## 2017-02-05 NOTE — ED Notes (Signed)
Attempted to call report. Primary nurse will call back.

## 2017-02-05 NOTE — ED Provider Notes (Signed)
I received this patient in signout from Dr. Lacinda Axon. We were awaiting the completion of her lab work, she had been sent in due to concerns for cough and possible pneumonia with fevers at home in the setting of chemotherapy use. Her lab work shows leukocytosis with WBC 15.8, creatinine 1.06. I discussed admission with hospitalist, Dr. Wynelle Cleveland, given her immunosuppression. Patient admitted for further care.   Little, Wenda Overland, MD 02/05/17 [phone]

## 2017-02-05 NOTE — Progress Notes (Signed)
Pharmacy Antibiotic Note  Kimberly [name] is a 76 y.o. female with PMH lung cancer who recently completed chemo 4/11, Afib, thyroidectomy, HLD, admitted on 02/05/2017 with pneumonia.  Pharmacy has been consulted for vancomycin and cefepime dosing for what is likely CAP in a patient on recent chemo.  Plan:  Vancomycin 1000 mg and Cefepime 2g IV now; f/u BMET for maintenance dosing  Goal trough 15-20 mcg/mL  Measure vancomycin trough levels at steady state as indicated  Prior episode of C diff on Augmentin, watch closely for diarrhea but agree with antibiotic choice for now.   Weight: 158 lb (71.7 kg)  Temp (24hrs), Avg:98.6 F (37 C), Min:98.6 F (37 C), Max:98.6 F (37 C)  No results for input(s): WBC, CREATININE, LATICACIDVEN, VANCOTROUGH, VANCOPEAK, VANCORANDOM, GENTTROUGH, GENTPEAK, GENTRANDOM, TOBRATROUGH, TOBRAPEAK, TOBRARND, AMIKACINPEAK, AMIKACINTROU, AMIKACIN in the last 168 hours.  CrCl cannot be calculated (Patient's most recent lab result is older than the maximum 21 days allowed.).    Allergies  Allergen Reactions  . Lactose Intolerance (Gi) Other (See Comments)  . Amiodarone Other (See Comments)    Corneal deposits  . Augmentin [Amoxicillin-Pot Clavulanate] Diarrhea    *ended up with cdiff* Has patient had a PCN reaction causing immediate rash, facial/tongue/throat swelling, SOB or lightheadedness with hypotension: No Has patient had a PCN reaction causing severe rash involving mucus membranes or skin necrosis: No Has patient had a PCN reaction that required hospitalization: Yes Has patient had a PCN reaction occurring within the last 10 years: Yes If all of the above answers are "NO", then may proceed with Cephalosporin use.   . Milk-Related Compounds Other (See Comments)    GI upset, projectile vomiting - can't take any dairy products    Antimicrobials this admission: 6/4 vancomycin >>  6/4 cefepime >>   Dose adjustments this admission: ---  Microbiology  results: 6/4 BCx: sent  Thank you for allowing pharmacy to be a part of this patient's care.  Reuel Boom, PharmD, BCPS Pager: [phone] 02/05/2017, 3:09 PM

## 2017-02-06 ENCOUNTER — Other Ambulatory Visit: Payer: Self-pay | Admitting: Internal Medicine

## 2017-02-06 DIAGNOSIS — E785 Hyperlipidemia, unspecified: Secondary | ICD-10-CM | POA: Diagnosis not present

## 2017-02-06 DIAGNOSIS — Z801 Family history of malignant neoplasm of trachea, bronchus and lung: Secondary | ICD-10-CM | POA: Diagnosis not present

## 2017-02-06 DIAGNOSIS — Z8249 Family history of ischemic heart disease and other diseases of the circulatory system: Secondary | ICD-10-CM | POA: Diagnosis not present

## 2017-02-06 DIAGNOSIS — R509 Fever, unspecified: Secondary | ICD-10-CM | POA: Diagnosis present

## 2017-02-06 DIAGNOSIS — C3491 Malignant neoplasm of unspecified part of right bronchus or lung: Secondary | ICD-10-CM | POA: Diagnosis not present

## 2017-02-06 DIAGNOSIS — I10 Essential (primary) hypertension: Secondary | ICD-10-CM | POA: Diagnosis not present

## 2017-02-06 DIAGNOSIS — M1990 Unspecified osteoarthritis, unspecified site: Secondary | ICD-10-CM | POA: Diagnosis present

## 2017-02-06 DIAGNOSIS — R195 Other fecal abnormalities: Secondary | ICD-10-CM | POA: Diagnosis not present

## 2017-02-06 DIAGNOSIS — A419 Sepsis, unspecified organism: Secondary | ICD-10-CM | POA: Diagnosis not present

## 2017-02-06 DIAGNOSIS — K922 Gastrointestinal hemorrhage, unspecified: Secondary | ICD-10-CM | POA: Diagnosis not present

## 2017-02-06 DIAGNOSIS — E860 Dehydration: Secondary | ICD-10-CM

## 2017-02-06 DIAGNOSIS — G629 Polyneuropathy, unspecified: Secondary | ICD-10-CM | POA: Diagnosis present

## 2017-02-06 DIAGNOSIS — J209 Acute bronchitis, unspecified: Secondary | ICD-10-CM | POA: Diagnosis not present

## 2017-02-06 DIAGNOSIS — C3490 Malignant neoplasm of unspecified part of unspecified bronchus or lung: Secondary | ICD-10-CM | POA: Diagnosis not present

## 2017-02-06 DIAGNOSIS — C3411 Malignant neoplasm of upper lobe, right bronchus or lung: Secondary | ICD-10-CM

## 2017-02-06 DIAGNOSIS — E890 Postprocedural hypothyroidism: Secondary | ICD-10-CM | POA: Diagnosis present

## 2017-02-06 DIAGNOSIS — D500 Iron deficiency anemia secondary to blood loss (chronic): Secondary | ICD-10-CM | POA: Diagnosis not present

## 2017-02-06 DIAGNOSIS — Z923 Personal history of irradiation: Secondary | ICD-10-CM | POA: Diagnosis not present

## 2017-02-06 DIAGNOSIS — J189 Pneumonia, unspecified organism: Secondary | ICD-10-CM | POA: Diagnosis not present

## 2017-02-06 DIAGNOSIS — Z87891 Personal history of nicotine dependence: Secondary | ICD-10-CM | POA: Diagnosis not present

## 2017-02-06 DIAGNOSIS — K2990 Gastroduodenitis, unspecified, without bleeding: Secondary | ICD-10-CM | POA: Diagnosis not present

## 2017-02-06 DIAGNOSIS — I480 Paroxysmal atrial fibrillation: Secondary | ICD-10-CM | POA: Diagnosis present

## 2017-02-06 DIAGNOSIS — I080 Rheumatic disorders of both mitral and aortic valves: Secondary | ICD-10-CM | POA: Diagnosis present

## 2017-02-06 DIAGNOSIS — R011 Cardiac murmur, unspecified: Secondary | ICD-10-CM | POA: Diagnosis present

## 2017-02-06 DIAGNOSIS — D649 Anemia, unspecified: Secondary | ICD-10-CM

## 2017-02-06 DIAGNOSIS — K449 Diaphragmatic hernia without obstruction or gangrene: Secondary | ICD-10-CM | POA: Diagnosis not present

## 2017-02-06 DIAGNOSIS — R05 Cough: Secondary | ICD-10-CM | POA: Diagnosis not present

## 2017-02-06 DIAGNOSIS — E739 Lactose intolerance, unspecified: Secondary | ICD-10-CM | POA: Diagnosis present

## 2017-02-06 DIAGNOSIS — D509 Iron deficiency anemia, unspecified: Secondary | ICD-10-CM | POA: Diagnosis not present

## 2017-02-06 DIAGNOSIS — K921 Melena: Secondary | ICD-10-CM

## 2017-02-06 DIAGNOSIS — K254 Chronic or unspecified gastric ulcer with hemorrhage: Secondary | ICD-10-CM | POA: Diagnosis not present

## 2017-02-06 DIAGNOSIS — C342 Malignant neoplasm of middle lobe, bronchus or lung: Secondary | ICD-10-CM | POA: Diagnosis not present

## 2017-02-06 DIAGNOSIS — J45909 Unspecified asthma, uncomplicated: Secondary | ICD-10-CM | POA: Diagnosis present

## 2017-02-06 DIAGNOSIS — K219 Gastro-esophageal reflux disease without esophagitis: Secondary | ICD-10-CM | POA: Diagnosis not present

## 2017-02-06 DIAGNOSIS — K259 Gastric ulcer, unspecified as acute or chronic, without hemorrhage or perforation: Secondary | ICD-10-CM | POA: Diagnosis not present

## 2017-02-06 DIAGNOSIS — K3189 Other diseases of stomach and duodenum: Secondary | ICD-10-CM | POA: Diagnosis not present

## 2017-02-06 LAB — VITAMIN B12: Vitamin B-12: 431 pg/mL (ref 180–914)

## 2017-02-06 LAB — IRON AND TIBC
IRON: 11 ug/dL — AB (ref 28–170)
Saturation Ratios: 6 % — ABNORMAL LOW (ref 10.4–31.8)
TIBC: 181 ug/dL — ABNORMAL LOW (ref 250–450)
UIBC: 170 ug/dL

## 2017-02-06 LAB — BASIC METABOLIC PANEL
Anion gap: 10 (ref 5–15)
BUN: 18 mg/dL (ref 6–20)
CALCIUM: 8.3 mg/dL — AB (ref 8.9–10.3)
CO2: 24 mmol/L (ref 22–32)
Chloride: 101 mmol/L (ref 101–111)
Creatinine, Ser: 1.04 mg/dL — ABNORMAL HIGH (ref 0.44–1.00)
GFR calc Af Amer: >60 mL/min (ref >60)
GFR, EST NON AFRICAN AMERICAN: 52 mL/min — AB (ref >60)
GLUCOSE: 101 mg/dL — AB (ref 65–99)
POTASSIUM: 2.8 mmol/L — AB (ref 3.5–5.1)
Sodium: 135 mmol/L (ref 135–145)

## 2017-02-06 LAB — RETICULOCYTES
RBC.: 2.02 MIL/uL — AB (ref 3.87–5.11)
RETIC COUNT ABSOLUTE: 34.3 10*3/uL (ref 19.0–186.0)
RETIC CT PCT: 1.7 % (ref 0.4–3.1)

## 2017-02-06 LAB — CBC
HEMATOCRIT: 20.5 % — AB (ref 36.0–46.0)
Hemoglobin: 7.2 g/dL — ABNORMAL LOW (ref 12.0–15.0)
MCH: 35.8 pg — ABNORMAL HIGH (ref 26.0–34.0)
MCHC: 35.1 g/dL (ref 30.0–36.0)
MCV: 102 fL — ABNORMAL HIGH (ref 78.0–100.0)
Platelets: 182 10*3/uL (ref 150–400)
RBC: 2.01 MIL/uL — ABNORMAL LOW (ref 3.87–5.11)
RDW: 14.5 % (ref 11.5–15.5)
WBC: 12.5 10*3/uL — ABNORMAL HIGH (ref 4.0–10.5)

## 2017-02-06 LAB — FERRITIN: Ferritin: 677 ng/mL — ABNORMAL HIGH (ref 11–307)

## 2017-02-06 LAB — FOLATE: Folate: 35.3 ng/mL (ref >5.9)

## 2017-02-06 LAB — OCCULT BLOOD X 1 CARD TO LAB, STOOL: Fecal Occult Bld: POSITIVE — AB

## 2017-02-06 LAB — MAGNESIUM: Magnesium: 1.2 mg/dL — ABNORMAL LOW (ref 1.7–2.4)

## 2017-02-06 MED ORDER — POTASSIUM CHLORIDE 10 MEQ/100ML IV SOLN
10.0000 meq | INTRAVENOUS | Status: AC
  Administered 2017-02-06 (×2): 10 meq via INTRAVENOUS
  Filled 2017-02-06 (×2): qty 100

## 2017-02-06 MED ORDER — POTASSIUM CHLORIDE CRYS ER 20 MEQ PO TBCR
40.0000 meq | EXTENDED_RELEASE_TABLET | ORAL | Status: AC
  Administered 2017-02-06 (×2): 40 meq via ORAL
  Filled 2017-02-06 (×2): qty 2

## 2017-02-06 MED ORDER — ORAL CARE MOUTH RINSE
15.0000 mL | Freq: Two times a day (BID) | OROMUCOSAL | Status: DC
  Administered 2017-02-06 – 2017-02-07 (×3): 15 mL via OROMUCOSAL

## 2017-02-06 MED ORDER — SODIUM CHLORIDE 0.9 % IV BOLUS (SEPSIS)
500.0000 mL | Freq: Once | INTRAVENOUS | Status: AC
  Administered 2017-02-06: 500 mL via INTRAVENOUS

## 2017-02-06 MED ORDER — FAMOTIDINE 20 MG PO TABS
20.0000 mg | ORAL_TABLET | Freq: Two times a day (BID) | ORAL | Status: DC
  Administered 2017-02-06 – 2017-02-08 (×5): 20 mg via ORAL
  Filled 2017-02-06 (×5): qty 1

## 2017-02-06 MED ORDER — CHLORHEXIDINE GLUCONATE 0.12 % MT SOLN
15.0000 mL | Freq: Two times a day (BID) | OROMUCOSAL | Status: DC
  Administered 2017-02-06 – 2017-02-09 (×7): 15 mL via OROMUCOSAL
  Filled 2017-02-06 (×7): qty 15

## 2017-02-06 MED ORDER — SODIUM CHLORIDE 0.9 % IV SOLN
INTRAVENOUS | Status: DC
  Administered 2017-02-06 – 2017-02-07 (×2): via INTRAVENOUS

## 2017-02-06 MED ORDER — MAGNESIUM SULFATE 4 GM/100ML IV SOLN
4.0000 g | Freq: Once | INTRAVENOUS | Status: AC
  Administered 2017-02-06: 4 g via INTRAVENOUS
  Filled 2017-02-06: qty 100

## 2017-02-06 MED ORDER — ENSURE ENLIVE PO LIQD
237.0000 mL | Freq: Two times a day (BID) | ORAL | Status: DC
  Administered 2017-02-06 (×2): 237 mL via ORAL

## 2017-02-06 MED ORDER — SODIUM CHLORIDE 0.9% FLUSH
10.0000 mL | INTRAVENOUS | Status: DC | PRN
  Administered 2017-02-08 – 2017-02-09 (×2): 10 mL
  Filled 2017-02-06 (×2): qty 40

## 2017-02-06 NOTE — Care Management Note (Signed)
Case Management Note  Patient Details  Name: Kimberly [name] MRN: [id number] Date of Birth: 10/16/1940  Subjective/Objective: 76 y/o f admitted w/Acute bronchitis. From home.                   Action/Plan:d/c plan home.   Expected Discharge Date:   (unknown)               Expected Discharge Plan:  Home/Self Care  In-House Referral:     Discharge planning Services  CM Consult  Post Acute Care Choice:    Choice offered to:     DME Arranged:    DME Agency:     HH Arranged:    HH Agency:     Status of Service:  In process, will continue to follow  If discussed at Long Length of Stay Meetings, dates discussed:    Additional Comments:  Dessa Phi, RN 02/06/2017, 12:01 PM

## 2017-02-06 NOTE — Progress Notes (Signed)
PROGRESS NOTE    Kimberly [name]   XBJ:[id number]  DOB: Nov 15, 1940  DOA: 02/05/2017 PCP: Deland Pretty, MD   Brief Narrative:  Kimberly [name] is a 76 y.o. female with medical history of non-small cell lung CA originally diagnosed in 2015 s/p chemoradiation with currently stable disease after 6 cycles of systemic chemo with Carboplatin and Paclitaxel. 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 She   presents today for a cough (which has been going on for about 1 wk now) with brown sputum and a fever of 102. She had a respiratory infection in Dec 2017 and developed c diff colitis from the antibiotics and required a hospital admission.   Subjective: Cough is only mildly improved. She is feeling lightheaded today. Her Hb is noted to have dropped and she admits to black stools intermittently for a couple of months now. Today her stool is black again. She does take a multivitamin with Iron in it and she has had blood transfusions in the past in relation to anemia from chemo.   Assessment & Plan:   Principal Problem:   Acute bronchitis - cont Zithromax- she did receive Vanc and Zosyn yesterday but CXR does not confirm pneumonia and therefore will treat as acute bronchitis (may be viral) - cont cough medications and Nebs as needed  Active Problems:  Sepsis - fever 102, WBC 15.8 - due to above  Dehydration - received 1 L NS in ER - BP was low this AM and she was given 500 cc bolus by night shift - she tells me that she is not making much urine today and feels dizzy when standing- will give another 500 cc bolus and start continuous fluid    Essential hypertension - Toprol XL- hold Chlorthalidone due to dehydration    Malignant neoplasm of right lung  - s/p chemo completed in April- apparently disease is stable and she is to be followed with CT scans every 3 months.      Hypothyroid - cont  synthroid    DVT prophylaxis: Lovenox Code Status: Full code Family Communication:  Disposition Plan: home when stable Consultants:    Procedures:    Antimicrobials:  Anti-infectives    Start     Dose/Rate Route Frequency Ordered Stop   02/06/17 1700  vancomycin (VANCOCIN) IVPB 1000 mg/200 mL premix  Status:  Discontinued     1,000 mg 200 mL/hr over 60 Minutes Intravenous Every 24 hours 02/05/17 1659 02/05/17 1832   02/06/17 1700  azithromycin (ZITHROMAX) tablet 500 mg     500 mg Oral Daily before supper 02/05/17 1818     02/06/17 1600  ceFEPIme (MAXIPIME) 1 g in dextrose 5 % 50 mL IVPB  Status:  Discontinued     1 g 100 mL/hr over 30 Minutes Intravenous Every 24 hours 02/05/17 1659 02/05/17 1832   02/05/17 1830  azithromycin (ZITHROMAX) tablet 500 mg  Status:  Discontinued     500 mg Oral Daily 02/05/17 1817 02/05/17 1818   02/05/17 1715  azithromycin (ZITHROMAX) tablet 500 mg     500 mg Oral  Once 02/05/17 1713 02/05/17 1722   02/05/17 1545  levofloxacin (LEVAQUIN) tablet 500 mg  Status:  Discontinued     500 mg Oral  Once 02/05/17 1538 02/05/17 1542   02/05/17 1500  vancomycin (VANCOCIN) IVPB 1000 mg/200 mL premix     1,000 mg 200 mL/hr over 60 Minutes Intravenous  Once 02/05/17 1420 02/05/17 1811   02/05/17 1430  ceFEPIme (MAXIPIME) 2 g in dextrose 5 % 50 mL IVPB     2 g 100 mL/hr over 30 Minutes Intravenous  Once 02/05/17 1420 02/05/17 1651       Objective: Vitals:   02/06/17 0700 02/06/17 0900 02/06/17 0936 02/06/17 1157  BP: (!) 95/54 (!) 99/55 (!) 112/53 126/61  Pulse: 88 88 90 91  Resp: 16 16  18   Temp: 98.9 F (37.2 C) 98.7 F (37.1 C)    TempSrc: Oral Oral    SpO2: 97% 99%  100%  Weight:      Height:        Intake/Output Summary (Last 24 hours) at 02/06/17 1228 Last data filed at 02/06/17 1225  Gross per 24 hour  Intake              240 ml  Output              350 ml  Net             -110 ml   Filed Weights   02/05/17 1415 02/05/17 1422  02/06/17 0552  Weight: 71.7 kg (158 lb) 71.7 kg (158 lb) 70.3 kg (155 lb)    Examination: General exam: Appears comfortable  HEENT: PERRLA, oral mucosa moist, no sclera icterus or thrush Respiratory system: Clear to auscultation. Respiratory effort normal. Cardiovascular system: S1 & S2 heard, RRR.  No murmurs  Gastrointestinal system: Abdomen soft, non-tender, nondistended. Normal bowel sound. No organomegaly Central nervous system: Alert and oriented. No focal neurological deficits. Extremities: No cyanosis, clubbing or edema Skin: No rashes or ulcers Psychiatry:  Mood & affect appropriate.     Data Reviewed: I have personally reviewed following labs and imaging studies  CBC:  Recent Labs Lab 02/05/17 1348 02/06/17 0830  WBC 15.8* 12.5*  NEUTROABS 13.9*  --   HGB 8.6* 7.2*  HCT 24.6* 20.5*  MCV 100.8* 102.0*  PLT 219 409   Basic Metabolic Panel:  Recent Labs Lab 02/05/17 1348 02/06/17 0830  NA 138 135  K 3.3* 2.8*  CL 103 101  CO2 25 24  GLUCOSE 132* 101*  BUN 22* 18  CREATININE 1.06* 1.04*  CALCIUM 8.9 8.3*  MG  --  1.2*   GFR: Estimated Creatinine Clearance: 40.6 mL/min (A) (by C-G formula based on SCr of 1.04 mg/dL (H)). Liver Function Tests:  Recent Labs Lab 02/05/17 1348  AST 23  ALT 18  ALKPHOS 93  BILITOT 1.0  PROT 7.1  ALBUMIN 3.4*   No results for input(s): LIPASE, AMYLASE in the last 168 hours. No results for input(s): AMMONIA in the last 168 hours. Coagulation Profile: No results for input(s): INR, PROTIME in the last 168 hours. Cardiac Enzymes: No results for input(s): CKTOTAL, CKMB, CKMBINDEX, TROPONINI in the last 168 hours. BNP (last 3 results) No results for input(s): PROBNP in the last 8760 hours. HbA1C: No results for input(s): HGBA1C in the last 72 hours. CBG: No results for input(s): GLUCAP in the last 168 hours. Lipid Profile: No results for input(s): CHOL, HDL, LDLCALC, TRIG, CHOLHDL, LDLDIRECT in the last 72  hours. Thyroid Function Tests: No results for input(s): TSH, T4TOTAL, FREET4, T3FREE, THYROIDAB in the last 72 hours. Anemia Panel: No results for input(s): VITAMINB12, FOLATE, FERRITIN, TIBC, IRON, RETICCTPCT in the last 72 hours. Urine analysis:    Component Value Date/Time   COLORURINE YELLOW 08/19/2016 1546   APPEARANCEUR CLEAR 08/19/2016 1546   LABSPEC 1.005  09/26/2016 1611   PHURINE 6.0 09/26/2016 1611   PHURINE 5.0 08/19/2016 1546   GLUCOSEU Negative 09/26/2016 1611   HGBUR Negative 09/26/2016 1611   HGBUR SMALL (A) 08/19/2016 1546   BILIRUBINUR Negative 09/26/2016 1611   KETONESUR Negative 09/26/2016 1611   KETONESUR NEGATIVE 08/19/2016 1546   PROTEINUR Negative 09/26/2016 1611   PROTEINUR 30 (A) 08/19/2016 1546   UROBILINOGEN 0.2 09/26/2016 1611   NITRITE Negative 09/26/2016 1611   NITRITE NEGATIVE 08/19/2016 1546   LEUKOCYTESUR Negative 09/26/2016 1611   Sepsis Labs: @LABRCNTIP (procalcitonin:4,lacticidven:4) ) Recent Results (from the past 240 hour(s))  Blood culture (routine x 2)     Status: None (Preliminary result)   Collection Time: 02/05/17  1:48 PM  Result Value Ref Range Status   Specimen Description BLOOD PORTA CATH  Final   Special Requests   Final    BOTTLES DRAWN AEROBIC AND ANAEROBIC Blood Culture adequate volume   Culture   Final    NO GROWTH < 24 HOURS Performed at [facility], [place] [address] [place], [place] [address]    Report Status PENDING  Incomplete  Blood culture (routine x 2)     Status: None (Preliminary result)   Collection Time: 02/05/17  1:53 PM  Result Value Ref Range Status   Specimen Description BLOOD LEFT ANTECUBITAL  Final   Special Requests   Final    BOTTLES DRAWN AEROBIC AND ANAEROBIC Blood Culture adequate volume   Culture   Final    NO GROWTH < 24 HOURS Performed at [facility], [place] [address] [place], [place] [address]    Report Status PENDING  Incomplete         Radiology Studies: Dg Chest 2  View  Result Date: 02/05/2017 CLINICAL DATA:  Cough, fever, and upper back pain for the past 3 days. Clinical diagnosis of pneumonia. Currently undergoing therapy for lung malignancy. History of asthma, atrial fibrillation, hypertension, and valvular heart disease. EXAM: CHEST  2 VIEW COMPARISON:  Chest x-ray of February 05, 2017 at 10:16 a.m. FINDINGS: The left lung is adequately inflated and clear. On the right there is mild volume loss. Patchy density in the mid and lower lung is present with partial obscuration of the hemidiaphragm. Deviation of the trachea toward the right is stable. The power port catheter tip projects over the junction of the proximal and midportions of the SVC. There surgical clips in the left paratracheal region. There is mild multilevel degenerative disc disease of the thoracic spine. IMPRESSION: Stable chronic changes in the right lung. No acute pneumonia nor CHF nor other acute cardiopulmonary abnormality. Electronically Signed   By: David  Martinique M.D.   On: 02/05/2017 14:28      Scheduled Meds: . atorvastatin  20 mg Oral Daily  . azithromycin  500 mg Oral QAC supper  . benzonatate  100 mg Oral TID  . chlorhexidine  15 mL Mouth Rinse BID  . dextromethorphan-guaiFENesin  1 tablet Oral BID  . enoxaparin (LOVENOX) injection  40 mg Subcutaneous Q24H  . famotidine  20 mg Oral BID  . feeding supplement (ENSURE ENLIVE)  237 mL Oral BID BM  . levothyroxine  150 mcg Oral Once per day on Sun Tue Thu Sat  . mouth rinse  15 mL Mouth Rinse q12n4p  . metoprolol succinate  12.5 mg Oral Daily  . montelukast  10 mg Oral QHS  . potassium chloride  40 mEq Oral Once  . potassium chloride  40 mEq Oral Q4H  .  sodium chloride flush  3 mL Intravenous Q12H   Continuous Infusions: . sodium chloride    . sodium chloride 75 mL/hr at 02/06/17 1121  . magnesium sulfate 1 - 4 g bolus IVPB    . potassium chloride 10 mEq (02/06/17 1202)     LOS: 0 days    Time spent in minutes:  35    Debbe Odea, MD [facility] Pager: [email or web address] Password [facility] 02/06/2017, 12:28 PM

## 2017-02-06 NOTE — Progress Notes (Signed)
Initial Nutrition Assessment  DOCUMENTATION CODES:   Not applicable  INTERVENTION:   Ensure Enlive po BID, each supplement provides 350 kcal and 20 grams of protein  RD will change diet from heart healthy to 2gm sodium to provide pt with more meal options as heart healthy diet restricts fat and salt  NUTRITION DIAGNOSIS:   Increased nutrient needs related to cancer and cancer related treatments (sepsis) as evidenced by increased estimated needs from protein.  GOAL:   Patient will meet greater than or equal to 90% of their needs  MONITOR:   PO intake, Supplement acceptance, Labs, Weight trends  REASON FOR ASSESSMENT:   Malnutrition Screening Tool    ASSESSMENT:   75 y.o. female with medical history of non-small cell lung CA originally diagnosed in 2015 s/p chemoradiation with currently stable disease after 6 cycles of systemic chemo with Carboplatin and Paclitaxel. 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 Admitted for acute brochitis    Met with pt in room today. Pt reports good appetite and oral intake up until about one week pta. Pt reports that she drinks Ensure at home. Pt currently eating 80% meals in hospital. Per chart, pt is weight stable. Pt with diarrhea today. Pt with low potassium; monitor and supplement as needed per MD discretion.    Medications reviewed and include: azithromycin, lovenox, pepcid, synthroid, KCl  Labs reviewed: K 2.8(L), creat 1.04(H), Ca 8.3(L), Mg 1.2(L) Wbc- 12.5(H), Hgb 7.2(L), Hct 20.5(L)  Nutrition-Focused physical exam completed. Findings are no fat depletion, no muscle depletion, and no edema.   Diet Order:  Diet Heart Room service appropriate? Yes; Fluid consistency: Thin  Skin:  Reviewed, no issues  Last BM:  6/5- diarrhea  Height:   Ht Readings from Last 1 Encounters:  02/06/17 4' 10.5" (1.486 m)    Weight:   Wt Readings from Last  1 Encounters:  02/06/17 155 lb (70.3 kg)    Ideal Body Weight:  44.3 kg  BMI:  Body mass index is 31.84 kg/m.  Estimated Nutritional Needs:   Kcal:  1400-1600kcal/day   Protein:  70-85g/day   Fluid:  >1.4L/day   EDUCATION NEEDS:   Education needs addressed    MS, RD, LDN Pager #- [phone]  

## 2017-02-06 NOTE — Progress Notes (Signed)
Report given to [place] on 4th floor. No questions or concerns at this time.

## 2017-02-06 NOTE — Care Management Note (Signed)
Case Management Note  Patient Details  Name: Kimberly [name] MRN: [id number] Date of Birth: 22-Oct-1940  Subjective/Objective:                  76 y.o. female with medical history of non-small cell lung CA originally diagnosed in 2015 s/p chemoradiation with currently stable disease after 6 cycles of systemic chemo with Carboplatin and Paclitaxel. 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  She presents today for a cough which has been going on for about 1 wk now. Sputum is brown. She has had a fever of 102 and has right upper back discomfort which is worse with coughing. She has clear nasal drainage as well. She had a respiratory infection in Dec 2017 and developed c diff colitis from the antibiotics and required a hospital admission.   Action/Plan: Date:  February 06, 2017  Chart reviewed for concurrent status and case management needs.  Will continue to follow patient progress.  Discharge Planning: following for needs  Expected discharge date: [id number]  Velva Harman, BSN, [place], [place]   Expected Discharge Date:   (unknown)               Expected Discharge Plan:  Home/Self Care  In-House Referral:     Discharge planning Services  CM Consult  Post Acute Care Choice:    Choice offered to:     DME Arranged:    DME Agency:     HH Arranged:    [place] Agency:     Status of Service:  In process, will continue to follow  If discussed at Long Length of Stay Meetings, dates discussed:    Additional Comments:  Leeroy Cha, RN 02/06/2017, 9:28 AM

## 2017-02-06 NOTE — Care Management Obs Status (Signed)
[facility] NOTIFICATION   Patient Details  Name: Kimberly [name] MRN: [id number] Date of Birth: October 14, 1940   Medicare Observation Status Notification Given:  Yes    MahabirJuliann Pulse, RN 02/06/2017, 1:05 PM

## 2017-02-07 ENCOUNTER — Inpatient Hospital Stay (HOSPITAL_COMMUNITY): Payer: Medicare Other

## 2017-02-07 DIAGNOSIS — J189 Pneumonia, unspecified organism: Secondary | ICD-10-CM

## 2017-02-07 DIAGNOSIS — K922 Gastrointestinal hemorrhage, unspecified: Secondary | ICD-10-CM

## 2017-02-07 LAB — CBC
HEMATOCRIT: 21.5 % — AB (ref 36.0–46.0)
HEMOGLOBIN: 7.2 g/dL — AB (ref 12.0–15.0)
MCH: 34.8 pg — ABNORMAL HIGH (ref 26.0–34.0)
MCHC: 33.5 g/dL (ref 30.0–36.0)
MCV: 103.9 fL — AB (ref 78.0–100.0)
Platelets: 186 10*3/uL (ref 150–400)
RBC: 2.07 MIL/uL — AB (ref 3.87–5.11)
RDW: 14.8 % (ref 11.5–15.5)
WBC: 9 10*3/uL (ref 4.0–10.5)

## 2017-02-07 LAB — PREPARE RBC (CROSSMATCH)

## 2017-02-07 LAB — BASIC METABOLIC PANEL
ANION GAP: 7 (ref 5–15)
BUN: 14 mg/dL (ref 6–20)
CHLORIDE: 107 mmol/L (ref 101–111)
CO2: 24 mmol/L (ref 22–32)
Calcium: 8.7 mg/dL — ABNORMAL LOW (ref 8.9–10.3)
Creatinine, Ser: 0.91 mg/dL (ref 0.44–1.00)
GFR calc non Af Amer: >60 mL/min (ref >60)
GLUCOSE: 92 mg/dL (ref 65–99)
POTASSIUM: 4.6 mmol/L (ref 3.5–5.1)
Sodium: 138 mmol/L (ref 135–145)

## 2017-02-07 LAB — MAGNESIUM: Magnesium: 2.3 mg/dL (ref 1.7–2.4)

## 2017-02-07 NOTE — Consult Note (Signed)
Reason for Consult: Anemia and heme positive stool Referring Physician: [facility]  Kimberly [name] Kimberly [name] HPI: This is a 76 year old female with multiple medical problems admitted for a cough, brown sputum, and a fever of 102.  She has a recurrence of her lung cancer and she is s/p colonoscopy on 01/2016 for complaints of diarrhea.  The work up with the colonoscopy was negative and her diarrhea resolved after the procedure.  It was felt that the colonic purse "reset" her colon, however, she started to have issues with diarrhea with resumption of her chemotherapy this past December.  She also suffered with C. Diff and since that time her bowels have the been the same.  She is finished with her chemotherapy.  With the chemotherapy she reported issues with dark to melenic stools.  On a daily basis her stools have always been very dark to black.  While in the hospital she was noted to have a progressive drop in her HGB from the 10 range in April 2018 down to her current 7.2 g/dL.    Past Medical History:  Diagnosis Date  . Allergic asthma without acute exacerbation or status asthmaticus   . Aortic insufficiency   . Arthritis   . Atrial fibrillation ([place])   . Back pain 08/14/2016  . Bronchitis   . Cough    dry, endobronchial mass  . Dyslipidemia   . Family history of adverse reaction to anesthesia    pts son also experiences N/V  . GERD (gastroesophageal reflux disease)   . Heart murmur   . History of blood transfusion   . History of bronchitis   . History of chemotherapy   . History of nuclear stress test 02/26/2006   exercise myoview; normal pattern of perfusion; low risk scan   . Hypertension   . Hypothyroidism   . Mitral insufficiency   . Pneumonia   . PONV (postoperative nausea and vomiting)   . Radiation 12/01/13-01/08/14   50.4 gray to right central chest  . Shortness of breath    with exertion  . Squamous cell carcinoma of lung ([place])   . Syncope    "states she has passed out a  few times. dr is trying to find cause.last time when geeting ready to go home after video bronch/bx  . Thyroid disease     Past Surgical History:  Procedure Laterality Date  . CARDIAC CATHETERIZATION  07/08/2008   normal coronaries  . CARPAL TUNNEL RELEASE Right 1988  . COLONOSCOPY N/A 02/11/2016   Procedure: COLONOSCOPY;  Surgeon: Carol Ada, MD;  Location: [facility];  Service: Endoscopy;  Laterality: N/A;  . DILATION AND CURETTAGE OF UTERUS    . EYE SURGERY Bilateral   . H/O MET Test w/PFT  04/02/2012   low risk; peak VO2 77% predicted  . IR GENERIC HISTORICAL  09/12/2016   IR FLUORO GUIDE PORT INSERTION RIGHT 09/12/2016 [name], MD [facility]  . IR GENERIC HISTORICAL  09/12/2016   IR US GUIDE VASC ACCESS RIGHT 09/12/2016 [name], MD [facility]  . JOINT REPLACEMENT  2003   thumb rt  . KNEE ARTHROSCOPY  12   rt meniscus  . MEDIASTINOSCOPY N/A 10/30/2013   Procedure: MEDIASTINOSCOPY;  Surgeon: Melrose Nakayama, MD;  Location: [facility];  Service: Thoracic;  Laterality: N/A;  . ROTATOR CUFF REPAIR  2006   ? side  . THYROIDECTOMY  1973  . TRANSTHORACIC ECHOCARDIOGRAM  09/25/2012   EF 55-60%; mild LVH & mild concentric hypertrophy;  mild AV regurg; RV systolic pressure increase consistent with mild pulm HTN  . VIDEO ASSISTED THORACOSCOPY (VATS)/ LOBECTOMY Right 11/13/2013   Procedure: VIDEO ASSISTED THORACOSCOPY (VATS) with mediastinal  biopsies;  Surgeon: Steven C Hendrickson, MD;  Location: [facility];  Service: Thoracic;  Laterality: Right;  RIGHT VATS,mediastinal biopsies  . VIDEO BRONCHOSCOPY Bilateral 09/25/2013   Procedure: VIDEO BRONCHOSCOPY WITHOUT FLUORO;  Surgeon: Michael B Wert, MD;  Location: [facility];  Service: Cardiopulmonary;  Laterality: Bilateral;  . VIDEO BRONCHOSCOPY WITH ENDOBRONCHIAL ULTRASOUND N/A 10/30/2013   Procedure: VIDEO BRONCHOSCOPY WITH ENDOBRONCHIAL ULTRASOUND;  Surgeon: Steven C Hendrickson, MD;  Location: [facility];  Service: Thoracic;   Laterality: N/A;    Family History  Problem Relation Age of Onset  . Lung cancer Mother   . Heart attack Father   . Heart failure Maternal Grandfather   . Heart attack Paternal Grandfather     Social History:  reports that she quit smoking about 49 years ago. Her smoking use included Cigarettes. She has a 3.00 pack-year smoking history. She has never used smokeless tobacco. She reports that she drinks alcohol. She reports that she does not use drugs.  Allergies:  Allergies  Allergen Reactions  . Lactose Intolerance (Gi) Other (See Comments)  . Amiodarone Other (See Comments)    Corneal deposits  . Augmentin [Amoxicillin-Pot Clavulanate] Diarrhea    *ended up with cdiff* Has patient had a PCN reaction causing immediate rash, facial/tongue/throat swelling, SOB or lightheadedness with hypotension: No Has patient had a PCN reaction causing severe rash involving mucus membranes or skin necrosis: No Has patient had a PCN reaction that required hospitalization: Yes Has patient had a PCN reaction occurring within the last 10 years: Yes If all of the above answers are "NO", then may proceed with Cephalosporin use.   . Milk-Related Compounds Other (See Comments)    GI upset, projectile vomiting - can't take any dairy products    Medications:  Scheduled: . atorvastatin  20 mg Oral Daily  . azithromycin  500 mg Oral QAC supper  . benzonatate  100 mg Oral TID  . chlorhexidine  15 mL Mouth Rinse BID  . dextromethorphan-guaiFENesin  1 tablet Oral BID  . famotidine  20 mg Oral BID  . feeding supplement (ENSURE ENLIVE)  237 mL Oral BID BM  . levothyroxine  150 mcg Oral Once per day on Sun Tue Thu Sat  . mouth rinse  15 mL Mouth Rinse q12n4p  . metoprolol succinate  12.5 mg Oral Daily  . montelukast  10 mg Oral QHS  . potassium chloride  40 mEq Oral Once  . sodium chloride flush  3 mL Intravenous Q12H   Continuous: . sodium chloride      Results for orders placed or performed during  the hospital encounter of 02/05/17 (from the past 24 hour(s))  CBC     Status: Abnormal   Collection Time: 02/07/17 10:35 AM  Result Value Ref Range   WBC 9.0 4.0 - 10.5 K/uL   RBC 2.07 (L) 3.87 - 5.11 MIL/uL   Hemoglobin 7.2 (L) 12.0 - 15.0 g/dL   HCT 21.5 (L) 36.0 - 46.0 %   MCV 103.9 (H) 78.0 - 100.0 fL   MCH 34.8 (H) 26.0 - 34.0 pg   MCHC 33.5 30.0 - 36.0 g/dL   RDW 14.8 11.5 - 15.5 %   Platelets 186 150 - 400 K/uL  Basic metabolic panel     Status: Abnormal   Collection Time: 02/07/17 10:35 AM    Result Value Ref Range   Sodium 138 135 - 145 mmol/L   Potassium 4.6 3.5 - 5.1 mmol/L   Chloride 107 101 - 111 mmol/L   CO2 24 22 - 32 mmol/L   Glucose, Bld 92 65 - 99 mg/dL   BUN 14 6 - 20 mg/dL   Creatinine, Ser 0.91 0.44 - 1.00 mg/dL   Calcium 8.7 (L) 8.9 - 10.3 mg/dL   GFR calc non Af Amer >60 >60 mL/min   GFR calc Af Amer >60 >60 mL/min   Anion gap 7 5 - 15  Magnesium     Status: None   Collection Time: 02/07/17 10:35 AM  Result Value Ref Range   Magnesium 2.3 1.7 - 2.4 mg/dL  Type and screen [place]     Status: None (Preliminary result)   Collection Time: 02/07/17 12:26 PM  Result Value Ref Range   ABO/RH(D) O POS    Antibody Screen NEG    Sample Expiration 02/10/2017    Unit Number [id number]    Blood Component Type RED CELLS,LR    Unit division 00    Status of Unit ISSUED    Transfusion Status OK TO TRANSFUSE    Crossmatch Result Compatible   Prepare RBC     Status: None   Collection Time: 02/07/17 12:58 PM  Result Value Ref Range   Order Confirmation ORDER PROCESSED BY BLOOD BANK      Dg Chest 2 View  Result Date: 02/07/2017 CLINICAL DATA:  History of non-small cell lung carcinoma with cough and fevers EXAM: CHEST  2 VIEW COMPARISON:  02/05/2017 FINDINGS: Cardiac shadow is stable. Right-sided chest wall port is again noted. Changes in the right lung consistent with prior lung carcinoma history are noted and stable. The left lung remains  clear. No acute infiltrate is noted. No sizable effusion is seen. No bony abnormality is noted. IMPRESSION: Stable chronic changes in the right lung consistent with the given clinical history and prior radiation. No acute abnormality is noted. Electronically Signed   By: Inez Catalina M.D.   On: 02/07/2017 12:22    ROS:  As stated above in the HPI otherwise negative.  Blood pressure (!) 142/79, pulse 88, temperature 98.3 F (36.8 C), temperature source Oral, resp. rate 16, height 4' 10.5" (1.486 m), weight 70.3 kg (155 lb), SpO2 95 %.    PE: Gen: NAD, Alert and Oriented HEENT:  [place]/AT, EOMI Neck: Supple, no LAD Lungs: CTA Bilaterally CV: RRR without M/G/R ABM: Soft, NTND, +BS Ext: No C/C/E  Assessment/Plan: 1) Anemia. 2) Melena. 3) Heme positive stool. 4) Recurrent non-small cell carcinoma   She did have anemia, but this is the lowest value.  With her melena and heme positive stools, I will pursue the EGD.    Plan: 1) EGD tomorrow.  Janautica Netzley D 02/07/2017, 6:27 PM

## 2017-02-07 NOTE — Progress Notes (Signed)
[facility] PROGRESS NOTE  [name] HBZ:[id number] DOB: 04-03-1941 DOA: 02/05/2017 PCP: Deland Pretty, MD  Brief Narrative:  Kimberly [name] a 76 y.o.femalewith medical history of non-small cell lung CA originally diagnosed in 2015 s/p chemoradiation with currently stable disease after 6 cycles of systemic chemo with Carboplatin and Paclitaxel. 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 She presents with cough (which has been going on for about 1 wk now) with brown sputum and a fever of 102. She had a respiratory infection in Dec 2017 and developed c diff colitis from the antibiotics and required a hospital admission.   Assessment & Plan:   Principal Problem:   Acute bronchitis - cont Zithromax- she did receive Vanc and Zosyn for possible pneumonia on admission. However, CXR does not confirm pneumonia on 6/4. Pend repeat chest x ray- cont cough medications and Nebs as needed  Active Problems:  Sepsis.  fever 102, WBC 15.8.  due to above. Improved   Dehydration - received 1 L NS in ER - BP was low this AM and she was given 500 cc bolus by night shift - she tells me that she is not making much urine today and feels dizzy when standing- will give another 500 cc bolus and start continuous fluid    Essential hypertension - Toprol XL- hold Chlorthalidone due to dehydration  Malignant neoplasm of right lung  - s/p chemo completed in April- apparently disease is stable and she is to be followed with CT scans every 3 months.     Hypothyroid. cont synthroid   Anemia. Symptomatic anemia. Questionable chronic gi blood loss. + occult blood test. Had some black stools at home. Colonoscopy (02/2016): diverticulosis.  -d/w patient, will TF 1 unit for symptomatic anemia. Consulted gi eval. Who plans possible colonoscopy. Hold lovenox with bleeding   -will start iron supplementation  after the colonoscopy    DVT prophylaxis: Lovenox Code Status: Full code Family Communication:  Disposition Plan: home when stable Consultants:    Procedures:    Antimicrobials:   HPI/Subjective: Patient reports lightheadedness. Dyspnea with ambulation. Hg is 7.2. Denies acute gi bleeding. But occult blood is positive. Had few episodes of black stools  Objective: Vitals:   02/07/17 0545 02/07/17 0935  BP: 118/69 126/64  Pulse: 60 82  Resp: 18   Temp: 98.6 F (37 C)     Intake/Output Summary (Last 24 hours) at 02/07/17 1133 Last data filed at 02/07/17 0953  Gross per 24 hour  Intake          2358.75 ml  Output             1600 ml  Net           758.75 ml   Filed Weights   02/05/17 1415 02/05/17 1422 02/06/17 0552  Weight: 71.7 kg (158 lb) 71.7 kg (158 lb) 70.3 kg (155 lb)    Exam:   General:  Alert, coughing   Cardiovascular: s1,s2 rrr  Respiratory: few crackles LL right  Abdomen: soft, nt, nd   Musculoskeletal: no leg edema    Data Reviewed: Basic Metabolic Panel:  Recent Labs Lab 02/05/17 1348 02/06/17 0830 02/07/17 1035  NA 138 135 138  K 3.3* 2.8* 4.6  CL 103 101 107  CO2 25 24 24   GLUCOSE 132* 101* 92  BUN 22* 18 14  CREATININE 1.06* 1.04* 0.91  CALCIUM 8.9 8.3* 8.7*  MG  --  1.2* 2.3   Liver Function Tests:  Recent Labs Lab 02/05/17 1348  AST 23  ALT 18  ALKPHOS 93  BILITOT 1.0  PROT 7.1  ALBUMIN 3.4*   No results for input(s): LIPASE, AMYLASE in the last 168 hours. No results for input(s): AMMONIA in the last 168 hours. CBC:  Recent Labs Lab 02/05/17 1348 02/06/17 0830 02/07/17 1035  WBC 15.8* 12.5* 9.0  NEUTROABS 13.9*  --   --   HGB 8.6* 7.2* 7.2*  HCT 24.6* 20.5* 21.5*  MCV 100.8* 102.0* 103.9*  PLT 219 182 186   Cardiac Enzymes: No results for input(s): CKTOTAL, CKMB, CKMBINDEX, TROPONINI in the last 168 hours. BNP (last 3 results) No results for input(s): BNP in the last 8760 hours.  ProBNP (last 3  results) No results for input(s): PROBNP in the last 8760 hours.  CBG: No results for input(s): GLUCAP in the last 168 hours.  Recent Results (from the past 240 hour(s))  Blood culture (routine x 2)     Status: None (Preliminary result)   Collection Time: 02/05/17  1:48 PM  Result Value Ref Range Status   Specimen Description BLOOD PORTA CATH  Final   Special Requests   Final    BOTTLES DRAWN AEROBIC AND ANAEROBIC Blood Culture adequate volume   Culture   Final    NO GROWTH < 24 HOURS Performed at [facility], [address] [place], [place] [address]    Report Status PENDING  Incomplete  Blood culture (routine x 2)     Status: None (Preliminary result)   Collection Time: 02/05/17  1:53 PM  Result Value Ref Range Status   Specimen Description BLOOD LEFT ANTECUBITAL  Final   Special Requests   Final    BOTTLES DRAWN AEROBIC AND ANAEROBIC Blood Culture adequate volume   Culture   Final    NO GROWTH < 24 HOURS Performed at [facility], [place] [address] [place], [place] [address]    Report Status PENDING  Incomplete     Studies: Dg Chest 2 View  Result Date: 02/05/2017 CLINICAL DATA:  Cough, fever, and upper back pain for the past 3 days. Clinical diagnosis of pneumonia. Currently undergoing therapy for lung malignancy. History of asthma, atrial fibrillation, hypertension, and valvular heart disease. EXAM: CHEST  2 VIEW COMPARISON:  Chest x-ray of February 05, 2017 at 10:16 a.m. FINDINGS: The left lung is adequately inflated and clear. On the right there is mild volume loss. Patchy density in the mid and lower lung is present with partial obscuration of the hemidiaphragm. Deviation of the trachea toward the right is stable. The power port catheter tip projects over the junction of the proximal and midportions of the SVC. There surgical clips in the left paratracheal region. There is mild multilevel degenerative disc disease of the thoracic spine. IMPRESSION: Stable chronic  changes in the right lung. No acute pneumonia nor CHF nor other acute cardiopulmonary abnormality. Electronically Signed   By: David  Martinique M.D.   On: 02/05/2017 14:28    Scheduled Meds: . atorvastatin  20 mg Oral Daily  . azithromycin  500 mg Oral QAC supper  . benzonatate  100 mg Oral TID  . chlorhexidine  15 mL Mouth Rinse BID  . dextromethorphan-guaiFENesin  1 tablet Oral BID  . enoxaparin (LOVENOX) injection  40 mg Subcutaneous Q24H  . famotidine  20 mg Oral BID  . feeding supplement (ENSURE ENLIVE)  237 mL Oral BID BM  .  levothyroxine  150 mcg Oral Once per day on Sun Tue Thu Sat  . mouth rinse  15 mL Mouth Rinse q12n4p  . metoprolol succinate  12.5 mg Oral Daily  . montelukast  10 mg Oral QHS  . potassium chloride  40 mEq Oral Once  . sodium chloride flush  3 mL Intravenous Q12H   Continuous Infusions: . sodium chloride    . sodium chloride 75 mL/hr at 02/07/17 0455    Principal Problem:   Acute bronchitis Active Problems:   Essential hypertension   Malignant neoplasm of right lung (HCC)   Pneumonia    Time spent: >35 minutes     Kinnie Feil  [facility] Pager [phone]. If 7PM-7AM, please contact night-coverage at [email or web address], password [facility] 02/07/2017, 11:33 AM  LOS: 1 day

## 2017-02-08 ENCOUNTER — Inpatient Hospital Stay (HOSPITAL_COMMUNITY): Payer: Medicare Other | Admitting: Certified Registered Nurse Anesthetist

## 2017-02-08 ENCOUNTER — Encounter (HOSPITAL_COMMUNITY)
Admission: EM | Disposition: A | Discharge status: Home / Self Care | Payer: Self-pay | Source: Home / Self Care | Attending: Internal Medicine

## 2017-02-08 ENCOUNTER — Encounter (HOSPITAL_COMMUNITY): Payer: Self-pay | Admitting: *Deleted

## 2017-02-08 DIAGNOSIS — J209 Acute bronchitis, unspecified: Secondary | ICD-10-CM

## 2017-02-08 DIAGNOSIS — D500 Iron deficiency anemia secondary to blood loss (chronic): Secondary | ICD-10-CM

## 2017-02-08 HISTORY — PX: ESOPHAGOGASTRODUODENOSCOPY: SHX5428

## 2017-02-08 LAB — BPAM RBC
BLOOD PRODUCT EXPIRATION DATE: 201806252359
ISSUE DATE / TIME: 201806061429
UNIT TYPE AND RH: 5100

## 2017-02-08 LAB — CBC
HCT: 26 % — ABNORMAL LOW (ref 36.0–46.0)
Hemoglobin: 8.7 g/dL — ABNORMAL LOW (ref 12.0–15.0)
MCH: 32.8 pg (ref 26.0–34.0)
MCHC: 33.5 g/dL (ref 30.0–36.0)
MCV: 98.1 fL (ref 78.0–100.0)
Platelets: 201 10*3/uL (ref 150–400)
RBC: 2.65 MIL/uL — ABNORMAL LOW (ref 3.87–5.11)
RDW: 17.2 % — AB (ref 11.5–15.5)
WBC: 8.7 10*3/uL (ref 4.0–10.5)

## 2017-02-08 LAB — TYPE AND SCREEN
ABO/RH(D): O POS
ANTIBODY SCREEN: NEGATIVE
UNIT DIVISION: 0

## 2017-02-08 LAB — GLUCOSE, CAPILLARY: Glucose-Capillary: 85 mg/dL (ref 65–99)

## 2017-02-08 SURGERY — EGD (ESOPHAGOGASTRODUODENOSCOPY)
Anesthesia: Monitor Anesthesia Care

## 2017-02-08 MED ORDER — ONDANSETRON HCL 4 MG/2ML IJ SOLN
INTRAMUSCULAR | Status: AC
Start: 2017-02-08 — End: 2017-02-08
  Filled 2017-02-08: qty 2

## 2017-02-08 MED ORDER — SODIUM CHLORIDE 0.9 % IV SOLN
INTRAVENOUS | Status: DC
  Administered 2017-02-08 (×2): via INTRAVENOUS

## 2017-02-08 MED ORDER — SODIUM CHLORIDE 0.9 % IV SOLN
510.0000 mg | Freq: Once | INTRAVENOUS | Status: AC
  Administered 2017-02-08: 510 mg via INTRAVENOUS
  Filled 2017-02-08: qty 17

## 2017-02-08 MED ORDER — PANTOPRAZOLE SODIUM 40 MG PO TBEC
40.0000 mg | DELAYED_RELEASE_TABLET | Freq: Two times a day (BID) | ORAL | Status: DC
  Administered 2017-02-08 – 2017-02-09 (×2): 40 mg via ORAL
  Filled 2017-02-08 (×2): qty 1

## 2017-02-08 MED ORDER — LIDOCAINE 2% (20 MG/ML) 5 ML SYRINGE
INTRAMUSCULAR | Status: DC | PRN
  Administered 2017-02-08: 100 mg via INTRAVENOUS

## 2017-02-08 MED ORDER — LIDOCAINE 2% (20 MG/ML) 5 ML SYRINGE
INTRAMUSCULAR | Status: AC
  Filled 2017-02-08: qty 5

## 2017-02-08 MED ORDER — ONDANSETRON HCL 4 MG/2ML IJ SOLN
INTRAMUSCULAR | Status: DC | PRN
  Administered 2017-02-08: 4 mg via INTRAVENOUS

## 2017-02-08 MED ORDER — PROPOFOL 500 MG/50ML IV EMUL
INTRAVENOUS | Status: DC | PRN
  Administered 2017-02-08: 75 ug/kg/min via INTRAVENOUS

## 2017-02-08 MED ORDER — PROPOFOL 10 MG/ML IV BOLUS
INTRAVENOUS | Status: DC | PRN
  Administered 2017-02-08 (×3): 10 mg via INTRAVENOUS
  Administered 2017-02-08: 20 mg via INTRAVENOUS
  Administered 2017-02-08 (×2): 10 mg via INTRAVENOUS

## 2017-02-08 MED ORDER — PROPOFOL 10 MG/ML IV BOLUS
INTRAVENOUS | Status: AC
  Filled 2017-02-08: qty 60

## 2017-02-08 NOTE — Interval H&P Note (Signed)
History and Physical Interval Note:  02/08/2017 1:57 PM  Kimberly [name]  has presented today for surgery, with the diagnosis of Anemia and heme positive stool  The various methods of treatment have been discussed with the patient and family. After consideration of risks, benefits and other options for treatment, the patient has consented to  Procedure(s): ESOPHAGOGASTRODUODENOSCOPY (EGD) (N/A) as a surgical intervention .  The patient's history has been reviewed, patient examined, no change in status, stable for surgery.  I have reviewed the patient's chart and labs.  Questions were answered to the patient's satisfaction.     Kimberly [name] D

## 2017-02-08 NOTE — Op Note (Signed)
[facility] Patient Name: Kimberly [name] Procedure Date: 02/08/2017 MRN: [id number] Attending MD: Carol Ada , MD Date of Birth: 02-Jan-1941 CSN: [id number] Age: 76 Admit Type: Outpatient Procedure:                Upper GI endoscopy Indications:              Iron deficiency anemia, Heme positive stool Providers:                Carol Ada, MD, Carmie End, RN, Marcene Duos, Technician Referring MD:              Medicines:                Propofol per Anesthesia Complications:            No immediate complications. Estimated Blood Loss:     Estimated blood loss was minimal. Procedure:                Pre-Anesthesia Assessment:                           - Prior to the procedure, a History and Physical                            was performed, and patient medications and                            allergies were reviewed. The patient's tolerance of                            previous anesthesia was also reviewed. The risks                            and benefits of the procedure and the sedation                            options and risks were discussed with the patient.                            All questions were answered, and informed consent                            was obtained. Prior Anticoagulants: The patient has                            taken no previous anticoagulant or antiplatelet                            agents. ASA Grade Assessment: IV - A patient with                            severe systemic disease that is a constant threat  to life. After reviewing the risks and benefits,                            the patient was deemed in satisfactory condition to                            undergo the procedure.                           - Sedation was administered by an anesthesia                            professional. Deep sedation was attained.                           After obtaining informed  consent, the endoscope was                            passed under direct vision. Throughout the                            procedure, the patient's blood pressure, pulse, and                            oxygen saturations were monitored continuously. The                            [id number] ([id number]) scope was introduced through the                            mouth, and advanced to the fourth part of duodenum.                            The upper GI endoscopy was accomplished without                            difficulty. The patient tolerated the procedure                            well. Scope In: Scope Out: Findings:      A 3 cm hiatal hernia was present.      Multiple non-bleeding superficial gastric ulcers with no stigmata of       bleeding were found in the gastric antrum. The largest lesion was 4 mm       in largest dimension. Biopsies were taken with a cold forceps for       histology.      Patchy mildly erythematous mucosa without active bleeding and with no       stigmata of bleeding was found in the duodenal bulb. Impression:               - 3 cm hiatal hernia.                           - Non-bleeding gastric ulcers with no stigmata of  bleeding. Biopsied.                           - Erythematous duodenopathy. Moderate Sedation:      N/A- Per Anesthesia Care Recommendation:           - Return patient to hospital ward for ongoing care.                           - Resume regular diet.                           - Use a proton pump inhibitor PO BID for 1 month                            and then QD indefinitely.                           - Continue present medications. Procedure Code(s):        --- Professional ---                           [id number], Esophagogastroduodenoscopy, flexible,                            transoral; with biopsy, single or multiple Diagnosis Code(s):        --- Professional ---                           K44.9, Diaphragmatic  hernia without obstruction or                            gangrene                           K25.9, Gastric ulcer, unspecified as acute or                            chronic, without hemorrhage or perforation                           K31.89, Other diseases of stomach and duodenum                           D50.9, Iron deficiency anemia, unspecified                           R19.5, Other fecal abnormalities CPT copyright 2016 [employer]. All rights reserved. The codes documented in this report are preliminary and upon coder review may  be revised to meet current compliance requirements. Carol Ada, MD Carol Ada, MD 02/08/2017 2:50:46 PM This report has been signed electronically. Number of Addenda: 0

## 2017-02-08 NOTE — H&P (View-Only) (Signed)
Reason for Consult: Anemia and heme positive stool Referring Physician: [facility]  [name] ARRION BURRUEL HPI: This is a 76 year old female with multiple medical problems admitted for a cough, brown sputum, and a fever of 102.  She has a recurrence of her lung cancer and she is s/p colonoscopy on 01/2016 for complaints of diarrhea.  The work up with the colonoscopy was negative and her diarrhea resolved after the procedure.  It was felt that the colonic purse "reset" her colon, however, she started to have issues with diarrhea with resumption of her chemotherapy this past December.  She also suffered with C. Diff and since that time her bowels have the been the same.  She is finished with her chemotherapy.  With the chemotherapy she reported issues with dark to melenic stools.  On a daily basis her stools have always been very dark to black.  While in the hospital she was noted to have a progressive drop in her HGB from the 10 range in April 2018 down to her current 7.2 g/dL.    Past Medical History:  Diagnosis Date  . Allergic asthma without acute exacerbation or status asthmaticus   . Aortic insufficiency   . Arthritis   . Atrial fibrillation ([place])   . Back pain 08/14/2016  . Bronchitis   . Cough    dry, endobronchial mass  . Dyslipidemia   . Family history of adverse reaction to anesthesia    pts son also experiences N/V  . GERD (gastroesophageal reflux disease)   . Heart murmur   . History of blood transfusion   . History of bronchitis   . History of chemotherapy   . History of nuclear stress test 02/26/2006   exercise myoview; normal pattern of perfusion; low risk scan   . Hypertension   . Hypothyroidism   . Mitral insufficiency   . Pneumonia   . PONV (postoperative nausea and vomiting)   . Radiation 12/01/13-01/08/14   50.4 gray to right central chest  . Shortness of breath    with exertion  . Squamous cell carcinoma of lung ([place])   . Syncope    "states she has passed out a  few times. dr is trying to find cause.last time when geeting ready to go home after video bronch/bx  . Thyroid disease     Past Surgical History:  Procedure Laterality Date  . CARDIAC CATHETERIZATION  07/08/2008   normal coronaries  . CARPAL TUNNEL RELEASE Right 1988  . COLONOSCOPY N/A 02/11/2016   Procedure: COLONOSCOPY;  Surgeon: Carol Ada, MD;  Location: [facility];  Service: Endoscopy;  Laterality: N/A;  . DILATION AND CURETTAGE OF UTERUS    . EYE SURGERY Bilateral   . H/O MET Test w/PFT  04/02/2012   low risk; peak VO2 77% predicted  . IR GENERIC HISTORICAL  09/12/2016   IR FLUORO GUIDE PORT INSERTION RIGHT 09/12/2016 [name], MD [facility]  . IR GENERIC HISTORICAL  09/12/2016   IR US GUIDE VASC ACCESS RIGHT 09/12/2016 [name], MD [facility]  . JOINT REPLACEMENT  2003   thumb rt  . KNEE ARTHROSCOPY  12   rt meniscus  . MEDIASTINOSCOPY N/A 10/30/2013   Procedure: MEDIASTINOSCOPY;  Surgeon: Melrose Nakayama, MD;  Location: [place];  Service: Thoracic;  Laterality: N/A;  . ROTATOR CUFF REPAIR  2006   ? side  . THYROIDECTOMY  1973  . TRANSTHORACIC ECHOCARDIOGRAM  09/25/2012   EF 55-60%; mild LVH & mild concentric hypertrophy;  mild AV regurg; RV systolic pressure increase consistent with mild pulm HTN  . VIDEO ASSISTED THORACOSCOPY (VATS)/ LOBECTOMY Right 11/13/2013   Procedure: VIDEO ASSISTED THORACOSCOPY (VATS) with mediastinal  biopsies;  Surgeon: Melrose Nakayama, MD;  Location: [place] [facility];  Service: Thoracic;  Laterality: Right;  RIGHT VATS,mediastinal biopsies  . VIDEO BRONCHOSCOPY Bilateral 09/25/2013   Procedure: VIDEO BRONCHOSCOPY WITHOUT FLUORO;  Surgeon: Tanda Rockers, MD;  Location: [facility];  Service: Cardiopulmonary;  Laterality: Bilateral;  . VIDEO BRONCHOSCOPY WITH ENDOBRONCHIAL ULTRASOUND N/A 10/30/2013   Procedure: VIDEO BRONCHOSCOPY WITH ENDOBRONCHIAL ULTRASOUND;  Surgeon: Melrose Nakayama, MD;  Location: [facility] OR;  Service: Thoracic;   Laterality: N/A;    Family History  Problem Relation Age of Onset  . Lung cancer Mother   . Heart attack Father   . Heart failure Maternal Grandfather   . Heart attack Paternal Grandfather     Social History:  reports that she quit smoking about 49 years ago. Her smoking use included Cigarettes. She has a 3.00 pack-year smoking history. She has never used smokeless tobacco. She reports that she drinks alcohol. She reports that she does not use drugs.  Allergies:  Allergies  Allergen Reactions  . Lactose Intolerance (Gi) Other (See Comments)  . Amiodarone Other (See Comments)    Corneal deposits  . Augmentin [Amoxicillin-Pot Clavulanate] Diarrhea    *ended up with cdiff* Has patient had a PCN reaction causing immediate rash, facial/tongue/throat swelling, SOB or lightheadedness with hypotension: No Has patient had a PCN reaction causing severe rash involving mucus membranes or skin necrosis: No Has patient had a PCN reaction that required hospitalization: Yes Has patient had a PCN reaction occurring within the last 10 years: Yes If all of the above answers are "NO", then may proceed with Cephalosporin use.   . Milk-Related Compounds Other (See Comments)    GI upset, projectile vomiting - can't take any dairy products    Medications:  Scheduled: . atorvastatin  20 mg Oral Daily  . azithromycin  500 mg Oral QAC supper  . benzonatate  100 mg Oral TID  . chlorhexidine  15 mL Mouth Rinse BID  . dextromethorphan-guaiFENesin  1 tablet Oral BID  . famotidine  20 mg Oral BID  . feeding supplement (ENSURE ENLIVE)  237 mL Oral BID BM  . levothyroxine  150 mcg Oral Once per day on Sun Tue Thu Sat  . mouth rinse  15 mL Mouth Rinse q12n4p  . metoprolol succinate  12.5 mg Oral Daily  . montelukast  10 mg Oral QHS  . potassium chloride  40 mEq Oral Once  . sodium chloride flush  3 mL Intravenous Q12H   Continuous: . sodium chloride      Results for orders placed or performed during  the hospital encounter of 02/05/17 (from the past 24 hour(s))  CBC     Status: Abnormal   Collection Time: 02/07/17 10:35 AM  Result Value Ref Range   WBC 9.0 4.0 - 10.5 K/uL   RBC 2.07 (L) 3.87 - 5.11 MIL/uL   Hemoglobin 7.2 (L) 12.0 - 15.0 g/dL   HCT 21.5 (L) 36.0 - 46.0 %   MCV 103.9 (H) 78.0 - 100.0 fL   MCH 34.8 (H) 26.0 - 34.0 pg   MCHC 33.5 30.0 - 36.0 g/dL   RDW 14.8 11.5 - 15.5 %   Platelets 186 150 - 400 K/uL  Basic metabolic panel     Status: Abnormal   Collection Time: 02/07/17 10:35 AM  Result Value Ref Range   Sodium 138 135 - 145 mmol/L   Potassium 4.6 3.5 - 5.1 mmol/L   Chloride 107 101 - 111 mmol/L   CO2 24 22 - 32 mmol/L   Glucose, Bld 92 65 - 99 mg/dL   BUN 14 6 - 20 mg/dL   Creatinine, Ser 0.91 0.44 - 1.00 mg/dL   Calcium 8.7 (L) 8.9 - 10.3 mg/dL   GFR calc non Af Amer >60 >60 mL/min   GFR calc Af Amer >60 >60 mL/min   Anion gap 7 5 - 15  Magnesium     Status: None   Collection Time: 02/07/17 10:35 AM  Result Value Ref Range   Magnesium 2.3 1.7 - 2.4 mg/dL  Type and screen [place]     Status: None (Preliminary result)   Collection Time: 02/07/17 12:26 PM  Result Value Ref Range   ABO/RH(D) O POS    Antibody Screen NEG    Sample Expiration 02/10/2017    Unit Number [id number]    Blood Component Type RED CELLS,LR    Unit division 00    Status of Unit ISSUED    Transfusion Status OK TO TRANSFUSE    Crossmatch Result Compatible   Prepare RBC     Status: None   Collection Time: 02/07/17 12:58 PM  Result Value Ref Range   Order Confirmation ORDER PROCESSED BY BLOOD BANK      Dg Chest 2 View  Result Date: 02/07/2017 CLINICAL DATA:  History of non-small cell lung carcinoma with cough and fevers EXAM: CHEST  2 VIEW COMPARISON:  02/05/2017 FINDINGS: Cardiac shadow is stable. Right-sided chest wall port is again noted. Changes in the right lung consistent with prior lung carcinoma history are noted and stable. The left lung remains  clear. No acute infiltrate is noted. No sizable effusion is seen. No bony abnormality is noted. IMPRESSION: Stable chronic changes in the right lung consistent with the given clinical history and prior radiation. No acute abnormality is noted. Electronically Signed   By: Inez Catalina M.D.   On: 02/07/2017 12:22    ROS:  As stated above in the HPI otherwise negative.  Blood pressure (!) 142/79, pulse 88, temperature 98.3 F (36.8 C), temperature source Oral, resp. rate 16, height 4' 10.5" (1.486 m), weight 70.3 kg (155 lb), SpO2 95 %.    PE: Gen: NAD, Alert and Oriented HEENT:  [place]/AT, EOMI Neck: Supple, no LAD Lungs: CTA Bilaterally CV: RRR without M/G/R ABM: Soft, NTND, +BS Ext: No C/C/E  Assessment/Plan: 1) Anemia. 2) Melena. 3) Heme positive stool. 4) Recurrent non-small cell carcinoma   She did have anemia, but this is the lowest value.  With her melena and heme positive stools, I will pursue the EGD.    Plan: 1) EGD tomorrow.  Leauna Sharber D 02/07/2017, 6:27 PM

## 2017-02-08 NOTE — Anesthesia Preprocedure Evaluation (Addendum)
Anesthesia Evaluation  Patient identified by MRN, date of birth, ID band Patient awake    Reviewed: Allergy & Precautions, NPO status , Patient's Chart, lab work & pertinent test results, reviewed documented beta blocker date and time   History of Anesthesia Complications (+) PONV and history of anesthetic complications  Airway Mallampati: I  TM Distance: >3 FB Neck ROM: Full    Dental  (+) Teeth Intact, Dental Advisory Given   Pulmonary asthma , former smoker,    breath sounds clear to auscultation       Cardiovascular hypertension, Pt. on home beta blockers + dysrhythmias Atrial Fibrillation  Rhythm:Regular Rate:Normal     Neuro/Psych negative neurological ROS  negative psych ROS   GI/Hepatic Neg liver ROS, GERD  ,  Endo/Other  Hypothyroidism   Renal/GU negative Renal ROS  negative genitourinary   Musculoskeletal  (+) Arthritis , Osteoarthritis,    Abdominal   Peds negative pediatric ROS (+)  Hematology   Anesthesia Other Findings - HLD  Reproductive/Obstetrics negative OB ROS                            Lab Results  Component Value Date   WBC 8.7 02/08/2017   HGB 8.7 (L) 02/08/2017   HCT 26.0 (L) 02/08/2017   MCV 98.1 02/08/2017   PLT 201 02/08/2017   Lab Results  Component Value Date   CREATININE 0.91 02/07/2017   BUN 14 02/07/2017   NA 138 02/07/2017   K 4.6 02/07/2017   CL 107 02/07/2017   CO2 24 02/07/2017   Lab Results  Component Value Date   INR 0.86 09/12/2016   INR 1.00 01/10/2014   INR 0.87 11/11/2013   12/2016 EKG: normal sinus rhythm.  Anesthesia Physical Anesthesia Plan  ASA: III  Anesthesia Plan: MAC   Post-op Pain Management:    Induction: Intravenous  PONV Risk Score and Plan: 1 and Ondansetron and Propofol  Airway Management Planned:   Additional Equipment:   Intra-op Plan:   Post-operative Plan:   Informed Consent: I have reviewed the  patients History and Physical, chart, labs and discussed the procedure including the risks, benefits and alternatives for the proposed anesthesia with the patient or authorized representative who has indicated his/her understanding and acceptance.     Plan Discussed with:   Anesthesia Plan Comments:         Anesthesia Quick Evaluation

## 2017-02-08 NOTE — Transfer of Care (Signed)
Immediate Anesthesia Transfer of Care Note  Patient: Kimberly [name]  Procedure(s) Performed: Procedure(s): ESOPHAGOGASTRODUODENOSCOPY (EGD) (N/A)  Patient Location: ENDO  Anesthesia Type:MAC  Level of Consciousness:  sedated, patient cooperative and responds to stimulation  Airway & Oxygen Therapy:Patient Spontanous Breathing and Patient connected to face mask oxgen  Post-op Assessment:  Report given to ENDO RN and Post -op Vital signs reviewed and stable  Post vital signs:  Reviewed and stable  Last Vitals:  Vitals:   02/08/17 1305 02/08/17 1350  BP: (!) 166/71 (!) 181/90  Pulse: 95 85  Resp: 16 17  Temp: 37.3 C 37 C    Complications: No apparent anesthesia complications

## 2017-02-08 NOTE — Progress Notes (Signed)
[facility] PROGRESS NOTE  Kimberly [name] VZC:[id number] DOB: 1941-01-29 DOA: 02/05/2017 PCP: Deland Pretty, MD  Brief Narrative:  Kimberly [name] a 76 y.o.femalewith medical history of non-small cell lung CA originally diagnosed in 2015 s/p chemoradiation with currently stable disease after 6 cycles of systemic chemo with Carboplatin and Paclitaxel. 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 She presents with cough (which has been going on for about 1 wk now) with brown sputum and a fever of 102. She had a respiratory infection in Dec 2017 and developed c diff colitis from the antibiotics and required a hospital admission.   Assessment & Plan:      Acute bronchitis -Chest x ray negative for PNA.  On Azithro day 4/5.  -nebulizer.   Sepsis.  fever 102, WBC 15.8.  due to above. Improved  Blood culture no growth to date,   Anemia. Symptomatic anemia, iron deficiency.. Questionable chronic gi blood loss. + occult blood test. Had some black stools at home. Colonoscopy (02/2016): diverticulosis.  -received one unit PRBC 6-06.  -hb at 8.  -for endoscopy today. Multiples gastric ulcer.  -will need IV iron.   Dehydration Resolved with fluids.     Essential hypertension - Toprol XL- hold Chlorthalidone due to dehydration  Malignant neoplasm of right lung  - s/p chemo completed in April- apparently disease is stable and she is to be followed with CT scans every 3 months.     Hypothyroid. cont synthroid     DVT prophylaxis: Lovenox Code Status: Full code Family Communication:  Disposition Plan: home when stable. Pt evaluation.  Consultants:    Procedures:    Antimicrobials:   HPI/Subjective: Patient reports lightheadedness. Dyspnea with ambulation. Hg is 7.2. Denies acute gi bleeding. But occult blood is positive. Had few episodes of black  stools  Objective: Vitals:   02/08/17 1500 02/08/17 1508  BP: (!) 150/80 (!) 169/71  Pulse: 85 88  Resp: (!) 23 19  Temp:      Intake/Output Summary (Last 24 hours) at 02/08/17 1607 Last data filed at 02/08/17 1445  Gross per 24 hour  Intake              786 ml  Output                0 ml  Net              786 ml   Filed Weights   02/05/17 1422 02/06/17 0552 02/08/17 1350  Weight: 71.7 kg (158 lb) 70.3 kg (155 lb) 70.3 kg (155 lb)    Exam:   General: Alert, in no acute distress.   Cardiovascular: S 1, S 2 RRR  Respiratory:  Normal respiratory effort, bilateral ronchus  Abdomen: soft, nt, nd  Musculoskeletal: no edema.   Neuro; alert and oriented, non focal.   Data Reviewed: Basic Metabolic Panel:  Recent Labs Lab 02/05/17 1348 02/06/17 0830 02/07/17 1035  NA 138 135 138  K 3.3* 2.8* 4.6  CL 103 101 107  CO2 25 24 24   GLUCOSE 132* 101* 92  BUN 22* 18 14  CREATININE 1.06* 1.04* 0.91  CALCIUM 8.9 8.3* 8.7*  MG  --  1.2* 2.3   Liver Function Tests:  Recent Labs Lab 02/05/17 1348  AST 23  ALT 18  ALKPHOS 93  BILITOT 1.0  PROT 7.1  ALBUMIN 3.4*   No results for input(s): LIPASE, AMYLASE in  the last 168 hours. No results for input(s): AMMONIA in the last 168 hours. CBC:  Recent Labs Lab 02/05/17 1348 02/06/17 0830 02/07/17 1035 02/08/17 0506  WBC 15.8* 12.5* 9.0 8.7  NEUTROABS 13.9*  --   --   --   HGB 8.6* 7.2* 7.2* 8.7*  HCT 24.6* 20.5* 21.5* 26.0*  MCV 100.8* 102.0* 103.9* 98.1  PLT 219 182 186 201   Cardiac Enzymes: No results for input(s): CKTOTAL, CKMB, CKMBINDEX, TROPONINI in the last 168 hours. BNP (last 3 results) No results for input(s): BNP in the last 8760 hours.  ProBNP (last 3 results) No results for input(s): PROBNP in the last 8760 hours.  CBG:  Recent Labs Lab 02/08/17 0723  GLUCAP 85    Recent Results (from the past 240 hour(s))  Blood culture (routine x 2)     Status: None (Preliminary result)    Collection Time: 02/05/17  1:48 PM  Result Value Ref Range Status   Specimen Description BLOOD PORTA CATH  Final   Special Requests   Final    BOTTLES DRAWN AEROBIC AND ANAEROBIC Blood Culture adequate volume   Culture   Final    NO GROWTH 3 DAYS Performed at [facility], [address] [place], [place] [address]    Report Status PENDING  Incomplete  Blood culture (routine x 2)     Status: None (Preliminary result)   Collection Time: 02/05/17  1:53 PM  Result Value Ref Range Status   Specimen Description BLOOD LEFT ANTECUBITAL  Final   Special Requests   Final    BOTTLES DRAWN AEROBIC AND ANAEROBIC Blood Culture adequate volume   Culture   Final    NO GROWTH 3 DAYS Performed at [facility], [place] [address], [place], [place] [address]    Report Status PENDING  Incomplete     Studies: Dg Chest 2 View  Result Date: 02/07/2017 CLINICAL DATA:  History of non-small cell lung carcinoma with cough and fevers EXAM: CHEST  2 VIEW COMPARISON:  02/05/2017 FINDINGS: Cardiac shadow is stable. Right-sided chest wall port is again noted. Changes in the right lung consistent with prior lung carcinoma history are noted and stable. The left lung remains clear. No acute infiltrate is noted. No sizable effusion is seen. No bony abnormality is noted. IMPRESSION: Stable chronic changes in the right lung consistent with the given clinical history and prior radiation. No acute abnormality is noted. Electronically Signed   By: Inez Catalina M.D.   On: 02/07/2017 12:22    Scheduled Meds: . atorvastatin  20 mg Oral Daily  . azithromycin  500 mg Oral QAC supper  . benzonatate  100 mg Oral TID  . chlorhexidine  15 mL Mouth Rinse BID  . dextromethorphan-guaiFENesin  1 tablet Oral BID  . feeding supplement (ENSURE ENLIVE)  237 mL Oral BID BM  . levothyroxine  150 mcg Oral Once per day on Sun Tue Thu Sat  . mouth rinse  15 mL Mouth Rinse q12n4p  . metoprolol succinate  12.5 mg Oral Daily  .  montelukast  10 mg Oral QHS  . pantoprazole  40 mg Oral BID AC  . potassium chloride  40 mEq Oral Once  . sodium chloride flush  3 mL Intravenous Q12H   Continuous Infusions: . sodium chloride      Principal Problem:   Acute bronchitis Active Problems:   Essential hypertension   Malignant neoplasm of right lung (HCC)   Pneumonia  Time spent: >35 minutes     Niel Hummer A  [facility] Pager [phone] If 7PM-7AM, please contact night-coverage at [email or web address], password [facility] 02/08/2017, 4:07 PM  LOS: 2 days

## 2017-02-09 ENCOUNTER — Encounter (HOSPITAL_COMMUNITY): Payer: Self-pay | Admitting: Gastroenterology

## 2017-02-09 DIAGNOSIS — I10 Essential (primary) hypertension: Secondary | ICD-10-CM

## 2017-02-09 LAB — BASIC METABOLIC PANEL
ANION GAP: 8 (ref 5–15)
BUN: 15 mg/dL (ref 6–20)
CALCIUM: 8.9 mg/dL (ref 8.9–10.3)
CO2: 27 mmol/L (ref 22–32)
Chloride: 105 mmol/L (ref 101–111)
Creatinine, Ser: 0.83 mg/dL (ref 0.44–1.00)
GFR calc non Af Amer: >60 mL/min (ref >60)
GLUCOSE: 76 mg/dL (ref 65–99)
Potassium: 4.1 mmol/L (ref 3.5–5.1)
SODIUM: 140 mmol/L (ref 135–145)

## 2017-02-09 LAB — CBC
HCT: 27.3 % — ABNORMAL LOW (ref 36.0–46.0)
Hemoglobin: 9.1 g/dL — ABNORMAL LOW (ref 12.0–15.0)
MCH: 33.7 pg (ref 26.0–34.0)
MCHC: 33.3 g/dL (ref 30.0–36.0)
MCV: 101.1 fL — ABNORMAL HIGH (ref 78.0–100.0)
Platelets: 219 10*3/uL (ref 150–400)
RBC: 2.7 MIL/uL — AB (ref 3.87–5.11)
RDW: 16.6 % — AB (ref 11.5–15.5)
WBC: 7.8 10*3/uL (ref 4.0–10.5)

## 2017-02-09 MED ORDER — BENZONATATE 100 MG PO CAPS: 100.0000 mg | ORAL_CAPSULE | Freq: Three times a day (TID) | ORAL | 0 refills | Status: DC | PRN

## 2017-02-09 MED ORDER — DM-GUAIFENESIN ER 30-600 MG PO TB12: 1.0000 | ORAL_TABLET | Freq: Two times a day (BID) | ORAL | 0 refills | Status: DC

## 2017-02-09 MED ORDER — ENSURE ENLIVE PO LIQD: 237.0000 mL | Freq: Two times a day (BID) | ORAL | 12 refills | Status: DC

## 2017-02-09 MED ORDER — FERROUS SULFATE 325 (65 FE) MG PO TABS: 325.0000 mg | ORAL_TABLET | Freq: Two times a day (BID) | ORAL | 1 refills | Status: DC

## 2017-02-09 MED ORDER — HEPARIN SOD (PORK) LOCK FLUSH 100 UNIT/ML IV SOLN
500.0000 [IU] | INTRAVENOUS | Status: AC | PRN
  Administered 2017-02-09: 500 [IU]

## 2017-02-09 MED ORDER — PANTOPRAZOLE SODIUM 40 MG PO TBEC: 40.0000 mg | DELAYED_RELEASE_TABLET | Freq: Two times a day (BID) | ORAL | 0 refills | Status: DC

## 2017-02-09 MED ORDER — FERROUS SULFATE 325 (65 FE) MG PO TABS: 325.0000 mg | ORAL_TABLET | Freq: Two times a day (BID) | ORAL | Status: DC

## 2017-02-09 NOTE — Discharge Summary (Signed)
Physician Discharge Summary  Kimberly [name] JJO:[id number] DOB: 11-Apr-1941 DOA: 02/05/2017  PCP: Deland Pretty, MD  Admit date: 02/05/2017 Discharge date: 02/09/2017  Admitted From: Home  Disposition: home   Recommendations for Outpatient Follow-up:  1. Follow up with PCP in 1-2 weeks 2. Please obtain BMP/CBC in one week 3. Follow biopsy results from gastric ulcers.     Discharge Condition: stable.  CODE STATUS: full cod.  Diet recommendation: Heart Healthy   Brief/Interim Summary: Kimberly [name] a 76 y.o.femalewith medical history of non-small cell lung CA originally diagnosed in 2015 s/p chemoradiation with currently stable disease after 6 cycles of systemic chemo with Carboplatin and Paclitaxel. 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 She presents with cough (which has been going on for about 1 wk now) with brown sputum and a fever of 102. She had a respiratory infection in Dec 2017 and developed c diff colitis from the antibiotics and required a hospital admission.   Assessment & Plan:   Acute bronchitis -Chest x ray negative for PNA.  On Azithro day 4/5.  -nebulizer.   Sepsis.  fever 102, WBC 15.8.  due to above. Improved  Blood culture no growth to date,   Anemia. Symptomatic anemia, iron deficiency.. Questionable chronic gi blood loss. + occult blood test. Had some black stools at home. Colonoscopy (02/2016): diverticulosis.  -received one unit PRBC 6-06.  -underwent endoscopy 6-07 ; Multiples gastric ulcer.  -received IV iron.  Discharge on oral iron. Hb stable at 9 hold aspirin at discharge. Follow hb level.   Multiples gastric ulcer; discharge on protonix.  Follow up biopsy results   Dehydration Resolved with fluids.   Essential hypertension - Toprol XL- resume Chlorthalidone at discharge   Malignant neoplasm of right lung  - s/p chemo completed in  April- apparently disease is stable and she is to be followed with CT scans every 3 months.   Hypothyroid. cont synthroid     Discharge Diagnoses:  Principal Problem:   Acute bronchitis Active Problems:   Essential hypertension   Malignant neoplasm of right lung [facility])   Pneumonia    Discharge Instructions  Discharge Instructions    Diet - low sodium heart healthy    Complete by:  As directed    Increase activity slowly    Complete by:  As directed      Allergies as of 02/09/2017      Reactions   Lactose Intolerance (gi) Other (See Comments)   Amiodarone Other (See Comments)   Corneal deposits   Augmentin [amoxicillin-pot Clavulanate] Diarrhea   *ended up with cdiff* Has patient had a PCN reaction causing immediate rash, facial/tongue/throat swelling, SOB or lightheadedness with hypotension: No Has patient had a PCN reaction causing severe rash involving mucus membranes or skin necrosis: No Has patient had a PCN reaction that required hospitalization: Yes Has patient had a PCN reaction occurring within the last 10 years: Yes If all of the above answers are "NO", then may proceed with Cephalosporin use.   Milk-related Compounds Other (See Comments)   GI upset, projectile vomiting - can't take any dairy products      Medication List    STOP taking these medications   aspirin EC 81 MG tablet   ibuprofen 200 MG tablet Commonly known as:  ADVIL,MOTRIN     TAKE these medications   albuterol 108 (90 Base) MCG/ACT inhaler Commonly known as:  PROVENTIL HFA;VENTOLIN  HFA Inhale 1 puff into the lungs every 6 (six) hours as needed for wheezing or shortness of breath.   atorvastatin 20 MG tablet Commonly known as:  LIPITOR Take 20 mg by mouth every morning.   benzonatate 100 MG capsule Commonly known as:  TESSALON Take 1 capsule (100 mg total) by mouth 3 (three) times daily as needed for cough.   chlorthalidone 25 MG tablet Commonly known as:  HYGROTON TAKE 1  TABLET BY MOUTH  DAILY   dextromethorphan-guaiFENesin 30-600 MG 12hr tablet Commonly known as:  MUCINEX DM Take 1 tablet by mouth 2 (two) times daily.   feeding supplement (ENSURE ENLIVE) Liqd Take 237 mLs by mouth 2 (two) times daily between meals.   ferrous sulfate 325 (65 FE) MG tablet Take 1 tablet (325 mg total) by mouth 2 (two) times daily with a meal.   levothyroxine 150 MCG tablet Commonly known as:  SYNTHROID, LEVOTHROID Take 150 mcg by mouth as directed. Take 1 tablet once every Tuesday, Thursday, Saturday, and Sunday.   lidocaine-prilocaine cream Commonly known as:  EMLA Apply 1 application topically as needed. What changed:  reasons to take this   loperamide 2 MG capsule Commonly known as:  IMODIUM Take 2 mg by mouth as needed for diarrhea or loose stools.   metoprolol succinate 25 MG 24 hr tablet Commonly known as:  TOPROL-XL TAKE ONE-HALF TABLET BY  MOUTH DAILY What changed:  See the new instructions.   montelukast 10 MG tablet Commonly known as:  SINGULAIR Take 10 mg by mouth at bedtime.   MULTIVITAMIN/IRON PO Take 1 tablet by mouth daily with breakfast.   pantoprazole 40 MG tablet Commonly known as:  PROTONIX Take 1 tablet (40 mg total) by mouth 2 (two) times daily before a meal.       Allergies  Allergen Reactions  . Lactose Intolerance (Gi) Other (See Comments)  . Amiodarone Other (See Comments)    Corneal deposits  . Augmentin [Amoxicillin-Pot Clavulanate] Diarrhea    *ended up with cdiff* Has patient had a PCN reaction causing immediate rash, facial/tongue/throat swelling, SOB or lightheadedness with hypotension: No Has patient had a PCN reaction causing severe rash involving mucus membranes or skin necrosis: No Has patient had a PCN reaction that required hospitalization: Yes Has patient had a PCN reaction occurring within the last 10 years: Yes If all of the above answers are "NO", then may proceed with Cephalosporin use.   . Milk-Related  Compounds Other (See Comments)    GI upset, projectile vomiting - can't take any dairy products    Consultations:  GI    Procedures/Studies: Dg Chest 2 View  Result Date: 02/07/2017 CLINICAL DATA:  History of non-small cell lung carcinoma with cough and fevers EXAM: CHEST  2 VIEW COMPARISON:  02/05/2017 FINDINGS: Cardiac shadow is stable. Right-sided chest wall port is again noted. Changes in the right lung consistent with prior lung carcinoma history are noted and stable. The left lung remains clear. No acute infiltrate is noted. No sizable effusion is seen. No bony abnormality is noted. IMPRESSION: Stable chronic changes in the right lung consistent with the given clinical history and prior radiation. No acute abnormality is noted. Electronically Signed   By: Inez Catalina M.D.   On: 02/07/2017 12:22   Dg Chest 2 View  Result Date: 02/05/2017 CLINICAL DATA:  Cough, fever, and upper back pain for the past 3 days. Clinical diagnosis of pneumonia. Currently undergoing therapy for lung malignancy. History of asthma, atrial fibrillation,  hypertension, and valvular heart disease. EXAM: CHEST  2 VIEW COMPARISON:  Chest x-ray of February 05, 2017 at 10:16 a.m. FINDINGS: The left lung is adequately inflated and clear. On the right there is mild volume loss. Patchy density in the mid and lower lung is present with partial obscuration of the hemidiaphragm. Deviation of the trachea toward the right is stable. The power port catheter tip projects over the junction of the proximal and midportions of the SVC. There surgical clips in the left paratracheal region. There is mild multilevel degenerative disc disease of the thoracic spine. IMPRESSION: Stable chronic changes in the right lung. No acute pneumonia nor CHF nor other acute cardiopulmonary abnormality. Electronically Signed   By: David  Martinique M.D.   On: 02/05/2017 14:28      Subjective: She is feeling better, tolerating diet   Discharge Exam: Vitals:    02/08/17 2200 02/09/17 0549  BP: 136/72 (!) 157/73  Pulse: 79 78  Resp: 18 18  Temp: 98.8 F (37.1 C) 98.4 F (36.9 C)   Vitals:   02/08/17 1500 02/08/17 1508 02/08/17 2200 02/09/17 0549  BP: (!) 150/80 (!) 169/71 136/72 (!) 157/73  Pulse: 85 88 79 78  Resp: (!) 23 19 18 18   Temp:   98.8 F (37.1 C) 98.4 F (36.9 C)  TempSrc:   Oral Oral  SpO2: 96% 97% 93% 92%  Weight:      Height:        General: Pt is alert, awake, not in acute distress Cardiovascular: RRR, S1/S2 +, no rubs, no gallops Respiratory: CTA bilaterally, no wheezing, no rhonchi Abdominal: Soft, NT, ND, bowel sounds + Extremities: no edema, no cyanosis    The results of significant diagnostics from this hospitalization (including imaging, microbiology, ancillary and laboratory) are listed below for reference.     Microbiology: Recent Results (from the past 240 hour(s))  Blood culture (routine x 2)     Status: None (Preliminary result)   Collection Time: 02/05/17  1:48 PM  Result Value Ref Range Status   Specimen Description BLOOD PORTA CATH  Final   Special Requests   Final    BOTTLES DRAWN AEROBIC AND ANAEROBIC Blood Culture adequate volume   Culture   Final    NO GROWTH 4 DAYS Performed at [facility], [address] [place], [place] [address]    Report Status PENDING  Incomplete  Blood culture (routine x 2)     Status: None (Preliminary result)   Collection Time: 02/05/17  1:53 PM  Result Value Ref Range Status   Specimen Description BLOOD LEFT ANTECUBITAL  Final   Special Requests   Final    BOTTLES DRAWN AEROBIC AND ANAEROBIC Blood Culture adequate volume   Culture   Final    NO GROWTH 4 DAYS Performed at [facility], [place] [address], [place], [place] [address]    Report Status PENDING  Incomplete     Labs: BNP (last 3 results) No results for input(s): BNP in the last 8760 hours. Basic Metabolic Panel:  Recent Labs Lab 02/05/17 1348 02/06/17 0830 02/07/17 1035  02/09/17 0353  NA 138 135 138 140  K 3.3* 2.8* 4.6 4.1  CL 103 101 107 105  CO2 25 24 24 27   GLUCOSE 132* 101* 92 76  BUN 22* 18 14 15   CREATININE 1.06* 1.04* 0.91 0.83  CALCIUM 8.9 8.3* 8.7* 8.9  MG  --  1.2* 2.3  --    Liver Function Tests:  Recent Labs Lab 02/05/17 1348  AST 23  ALT 18  ALKPHOS 93  BILITOT 1.0  PROT 7.1  ALBUMIN 3.4*   No results for input(s): LIPASE, AMYLASE in the last 168 hours. No results for input(s): AMMONIA in the last 168 hours. CBC:  Recent Labs Lab 02/05/17 1348 02/06/17 0830 02/07/17 1035 02/08/17 0506 02/09/17 0353  WBC 15.8* 12.5* 9.0 8.7 7.8  NEUTROABS 13.9*  --   --   --   --   HGB 8.6* 7.2* 7.2* 8.7* 9.1*  HCT 24.6* 20.5* 21.5* 26.0* 27.3*  MCV 100.8* 102.0* 103.9* 98.1 101.1*  PLT 219 182 186 201 219   Cardiac Enzymes: No results for input(s): CKTOTAL, CKMB, CKMBINDEX, TROPONINI in the last 168 hours. BNP: Invalid input(s): POCBNP CBG:  Recent Labs Lab 02/08/17 0723  GLUCAP 85   D-Dimer No results for input(s): DDIMER in the last 72 hours. Hgb A1c No results for input(s): HGBA1C in the last 72 hours. Lipid Profile No results for input(s): CHOL, HDL, LDLCALC, TRIG, CHOLHDL, LDLDIRECT in the last 72 hours. Thyroid function studies No results for input(s): TSH, T4TOTAL, T3FREE, THYROIDAB in the last 72 hours.  Invalid input(s): FREET3 Anemia work up  Recent Labs  02/06/17 1438  VITAMINB12 431  FOLATE 35.3  FERRITIN 677*  TIBC 181*  IRON 11*  RETICCTPCT 1.7   Urinalysis    Component Value Date/Time   COLORURINE YELLOW 08/19/2016 [place] 08/19/2016 1546   LABSPEC 1.005 09/26/2016 1611   PHURINE 6.0 09/26/2016 1611   PHURINE 5.0 08/19/2016 1546   GLUCOSEU Negative 09/26/2016 1611   HGBUR Negative 09/26/2016 1611   HGBUR SMALL (A) 08/19/2016 1546   BILIRUBINUR Negative 09/26/2016 1611   KETONESUR Negative 09/26/2016 1611   KETONESUR NEGATIVE 08/19/2016 1546   PROTEINUR Negative  09/26/2016 1611   PROTEINUR 30 (A) 08/19/2016 1546   UROBILINOGEN 0.2 09/26/2016 1611   NITRITE Negative 09/26/2016 1611   NITRITE NEGATIVE 08/19/2016 1546   LEUKOCYTESUR Negative 09/26/2016 1611   Sepsis Labs Invalid input(s): PROCALCITONIN,  WBC,  LACTICIDVEN Microbiology Recent Results (from the past 240 hour(s))  Blood culture (routine x 2)     Status: None (Preliminary result)   Collection Time: 02/05/17  1:48 PM  Result Value Ref Range Status   Specimen Description BLOOD PORTA CATH  Final   Special Requests   Final    BOTTLES DRAWN AEROBIC AND ANAEROBIC Blood Culture adequate volume   Culture   Final    NO GROWTH 4 DAYS Performed at [facility], [place] [address], [place], [place] [address]    Report Status PENDING  Incomplete  Blood culture (routine x 2)     Status: None (Preliminary result)   Collection Time: 02/05/17  1:53 PM  Result Value Ref Range Status   Specimen Description BLOOD LEFT ANTECUBITAL  Final   Special Requests   Final    BOTTLES DRAWN AEROBIC AND ANAEROBIC Blood Culture adequate volume   Culture   Final    NO GROWTH 4 DAYS Performed at [facility], [place] [address], [place], [place] [address]    Report Status PENDING  Incomplete     Time coordinating discharge: Over 30 minutes  SIGNED:   Elmarie Shiley, MD  [facility] 02/09/2017, 12:00 PM Pager   If 7PM-7AM, please contact night-coverage [email or web address] Password [email or web address]

## 2017-02-09 NOTE — Anesthesia Postprocedure Evaluation (Signed)
Anesthesia Post Note  Patient: Kimberly [name]  Procedure(s) Performed: Procedure(s) (LRB): ESOPHAGOGASTRODUODENOSCOPY (EGD) (N/A)     Patient location during evaluation: PACU Anesthesia Type: MAC Level of consciousness: awake and alert Pain management: pain level controlled Vital Signs Assessment: post-procedure vital signs reviewed and stable Respiratory status: spontaneous breathing, nonlabored ventilation, respiratory function stable and patient connected to nasal cannula oxygen Cardiovascular status: stable and blood pressure returned to baseline Anesthetic complications: no    Last Vitals:  Vitals:   02/08/17 2200 02/09/17 0549  BP: 136/72 (!) 157/73  Pulse: 79 78  Resp: 18 18  Temp: 37.1 C 36.9 C    Last Pain:  Vitals:   02/09/17 0549  TempSrc: Oral  PainSc:                  Effie Berkshire

## 2017-02-09 NOTE — Progress Notes (Signed)
Subjective: Feeling better, but still fatigued.  Relieved that the EGD revealed benign findings.  Objective: Vital signs in last 24 hours: Temp:  [98.4 F (36.9 C)-98.9 F (37.2 C)] 98.4 F (36.9 C) (06/08 0549) Pulse Rate:  [78-95] 78 (06/08 0549) Resp:  [16-23] 18 (06/08 0549) BP: (136-181)/(71-90) 157/73 (06/08 0549) SpO2:  [92 %-100 %] 92 % (06/08 0549) Weight:  [70.3 kg (155 lb)] 70.3 kg (155 lb) (06/07 1350) Last BM Date: 02/08/17  Intake/Output from previous day: 06/07 0701 - 06/08 0700 In: 537 [P.O.:120; I.V.:300; IV Piggyback:117] Out: -  Intake/Output this shift: No intake/output data recorded.  General appearance: alert and no distress GI: soft, non-tender; bowel sounds normal; no masses,  no organomegaly  Lab Results:  Recent Labs  02/07/17 1035 02/08/17 0506 02/09/17 0353  WBC 9.0 8.7 7.8  HGB 7.2* 8.7* 9.1*  HCT 21.5* 26.0* 27.3*  PLT 186 201 219   BMET  Recent Labs  02/07/17 1035 02/09/17 0353  NA 138 140  K 4.6 4.1  CL 107 105  CO2 24 27  GLUCOSE 92 76  BUN 14 15  CREATININE 0.91 0.83  CALCIUM 8.7* 8.9   LFT No results for input(s): PROT, ALBUMIN, AST, ALT, ALKPHOS, BILITOT, BILIDIR, IBILI in the last 72 hours. PT/INR No results for input(s): LABPROT, INR in the last 72 hours. Hepatitis Panel No results for input(s): HEPBSAG, HCVAB, HEPAIGM, HEPBIGM in the last 72 hours. C-Diff No results for input(s): CDIFFTOX in the last 72 hours. Fecal Lactopherrin No results for input(s): FECLLACTOFRN in the last 72 hours.  Studies/Results: No results found.  Medications:  Scheduled: . atorvastatin  20 mg Oral Daily  . azithromycin  500 mg Oral QAC supper  . benzonatate  100 mg Oral TID  . chlorhexidine  15 mL Mouth Rinse BID  . dextromethorphan-guaiFENesin  1 tablet Oral BID  . feeding supplement (ENSURE ENLIVE)  237 mL Oral BID BM  . ferrous sulfate  325 mg Oral BID WC  . levothyroxine  150 mcg Oral Once per day on Sun Tue Thu Sat  .  mouth rinse  15 mL Mouth Rinse q12n4p  . metoprolol succinate  12.5 mg Oral Daily  . montelukast  10 mg Oral QHS  . pantoprazole  40 mg Oral BID AC  . potassium chloride  40 mEq Oral Once  . sodium chloride flush  3 mL Intravenous Q12H   Continuous: . sodium chloride      Assessment/Plan: 1) Multiple small antral ulcers. 2) Lung cancer.   Clinically she is well.  I am uncertain if her anemia can explain all of her fatigue, but time will tell.  Her HGB is up after the transfusions, but she does not notice a significant difference.  Plan: 1) Supportive care per hospitalist. 2) Signing off. 3) Follow up in the office in 2-4 weeks.  LOS: 3 days   Neila Teem D 02/09/2017, 12:30 PM

## 2017-02-09 NOTE — Care Management Note (Signed)
Case Management Note  Patient Details  Name: Kimberly [name] MRN: [id number] Date of Birth: [birth year]  Subjective/Objective:  76 y/o f admitted w/Acute bronchitis. From home. No CM needs.                  Action/Plan:d/c home.   Expected Discharge Date:  02/09/17               Expected Discharge Plan:  Home/Self Care  In-House Referral:     Discharge planning Services  CM Consult  Post Acute Care Choice:    Choice offered to:     DME Arranged:    DME Agency:     HH Arranged:    HH Agency:     Status of Service:  Completed, signed off  If discussed at [employer] of Stay Meetings, dates discussed:    Additional Comments:  Dessa Phi, RN 02/09/2017, 1:01 PM

## 2017-02-10 LAB — CULTURE, BLOOD (ROUTINE X 2)
Culture: NO GROWTH
Culture: NO GROWTH
Special Requests: ADEQUATE
Special Requests: ADEQUATE

## 2017-02-19 DIAGNOSIS — D508 Other iron deficiency anemias: Secondary | ICD-10-CM | POA: Diagnosis not present

## 2017-02-19 DIAGNOSIS — J189 Pneumonia, unspecified organism: Secondary | ICD-10-CM | POA: Diagnosis not present

## 2017-02-19 DIAGNOSIS — I10 Essential (primary) hypertension: Secondary | ICD-10-CM | POA: Diagnosis not present

## 2017-02-27 ENCOUNTER — Ambulatory Visit (HOSPITAL_BASED_OUTPATIENT_CLINIC_OR_DEPARTMENT_OTHER): Payer: Medicare Other

## 2017-02-27 DIAGNOSIS — Z452 Encounter for adjustment and management of vascular access device: Secondary | ICD-10-CM

## 2017-02-27 DIAGNOSIS — C342 Malignant neoplasm of middle lobe, bronchus or lung: Secondary | ICD-10-CM

## 2017-02-27 DIAGNOSIS — E860 Dehydration: Secondary | ICD-10-CM

## 2017-02-27 MED ORDER — HEPARIN SOD (PORK) LOCK FLUSH 100 UNIT/ML IV SOLN
500.0000 [IU] | Freq: Once | INTRAVENOUS | Status: AC | PRN
  Administered 2017-02-27: 500 [IU]
  Filled 2017-02-27: qty 5

## 2017-02-27 MED ORDER — SODIUM CHLORIDE 0.9% FLUSH
10.0000 mL | INTRAVENOUS | Status: DC | PRN
  Administered 2017-02-27: 10 mL
  Filled 2017-02-27: qty 10

## 2017-03-08 DIAGNOSIS — D539 Nutritional anemia, unspecified: Secondary | ICD-10-CM | POA: Diagnosis not present

## 2017-03-08 DIAGNOSIS — D508 Other iron deficiency anemias: Secondary | ICD-10-CM | POA: Diagnosis not present

## 2017-03-08 DIAGNOSIS — E876 Hypokalemia: Secondary | ICD-10-CM | POA: Diagnosis not present

## 2017-03-08 DIAGNOSIS — R197 Diarrhea, unspecified: Secondary | ICD-10-CM | POA: Diagnosis not present

## 2017-03-08 DIAGNOSIS — D509 Iron deficiency anemia, unspecified: Secondary | ICD-10-CM | POA: Diagnosis not present

## 2017-03-08 DIAGNOSIS — D649 Anemia, unspecified: Secondary | ICD-10-CM | POA: Diagnosis not present

## 2017-03-08 DIAGNOSIS — I10 Essential (primary) hypertension: Secondary | ICD-10-CM | POA: Diagnosis not present

## 2017-03-09 DIAGNOSIS — R197 Diarrhea, unspecified: Secondary | ICD-10-CM | POA: Diagnosis not present

## 2017-03-12 DIAGNOSIS — D509 Iron deficiency anemia, unspecified: Secondary | ICD-10-CM | POA: Diagnosis not present

## 2017-03-12 DIAGNOSIS — K2990 Gastroduodenitis, unspecified, without bleeding: Secondary | ICD-10-CM | POA: Diagnosis not present

## 2017-03-12 DIAGNOSIS — K259 Gastric ulcer, unspecified as acute or chronic, without hemorrhage or perforation: Secondary | ICD-10-CM | POA: Diagnosis not present

## 2017-04-09 ENCOUNTER — Other Ambulatory Visit (HOSPITAL_BASED_OUTPATIENT_CLINIC_OR_DEPARTMENT_OTHER): Payer: Medicare Other

## 2017-04-09 ENCOUNTER — Encounter (HOSPITAL_COMMUNITY): Payer: Self-pay

## 2017-04-09 ENCOUNTER — Ambulatory Visit (HOSPITAL_COMMUNITY)
Admission: RE | Admit: 2017-04-09 | Discharge: 2017-04-09 | Disposition: A | Discharge status: Home / Self Care | Payer: Medicare Other | Source: Ambulatory Visit | Attending: Internal Medicine | Admitting: Internal Medicine

## 2017-04-09 ENCOUNTER — Ambulatory Visit (HOSPITAL_BASED_OUTPATIENT_CLINIC_OR_DEPARTMENT_OTHER): Payer: Medicare Other

## 2017-04-09 DIAGNOSIS — I700 Atherosclerosis of aorta: Secondary | ICD-10-CM | POA: Diagnosis not present

## 2017-04-09 DIAGNOSIS — C342 Malignant neoplasm of middle lobe, bronchus or lung: Secondary | ICD-10-CM | POA: Insufficient documentation

## 2017-04-09 DIAGNOSIS — C3491 Malignant neoplasm of unspecified part of right bronchus or lung: Secondary | ICD-10-CM | POA: Diagnosis not present

## 2017-04-09 DIAGNOSIS — Z5111 Encounter for antineoplastic chemotherapy: Secondary | ICD-10-CM | POA: Insufficient documentation

## 2017-04-09 DIAGNOSIS — C3411 Malignant neoplasm of upper lobe, right bronchus or lung: Secondary | ICD-10-CM | POA: Diagnosis not present

## 2017-04-09 DIAGNOSIS — J90 Pleural effusion, not elsewhere classified: Secondary | ICD-10-CM | POA: Insufficient documentation

## 2017-04-09 DIAGNOSIS — I2510 Atherosclerotic heart disease of native coronary artery without angina pectoris: Secondary | ICD-10-CM | POA: Diagnosis not present

## 2017-04-09 DIAGNOSIS — E860 Dehydration: Secondary | ICD-10-CM

## 2017-04-09 DIAGNOSIS — N2889 Other specified disorders of kidney and ureter: Secondary | ICD-10-CM | POA: Insufficient documentation

## 2017-04-09 DIAGNOSIS — R5383 Other fatigue: Secondary | ICD-10-CM | POA: Diagnosis not present

## 2017-04-09 DIAGNOSIS — C3432 Malignant neoplasm of lower lobe, left bronchus or lung: Secondary | ICD-10-CM

## 2017-04-09 LAB — COMPREHENSIVE METABOLIC PANEL
ALK PHOS: 106 U/L (ref 40–150)
ALT: 18 U/L (ref 0–55)
ANION GAP: 9 meq/L (ref 3–11)
AST: 22 U/L (ref 5–34)
Albumin: 3.7 g/dL (ref 3.5–5.0)
BILIRUBIN TOTAL: 0.59 mg/dL (ref 0.20–1.20)
BUN: 20.1 mg/dL (ref 7.0–26.0)
CALCIUM: 10 mg/dL (ref 8.4–10.4)
CO2: 28 meq/L (ref 22–29)
CREATININE: 0.9 mg/dL (ref 0.6–1.1)
Chloride: 102 mEq/L (ref 98–109)
EGFR: 64 mL/min/{1.73_m2} — AB (ref >90)
GLUCOSE: 88 mg/dL (ref 70–140)
Potassium: 4.2 mEq/L (ref 3.5–5.1)
SODIUM: 139 meq/L (ref 136–145)
Total Protein: 6.7 g/dL (ref 6.4–8.3)

## 2017-04-09 LAB — CBC WITH DIFFERENTIAL/PLATELET
BASO%: 0.8 % (ref 0.0–2.0)
Basophils Absolute: 0.1 10*3/uL (ref 0.0–0.1)
EOS%: 2.8 % (ref 0.0–7.0)
Eosinophils Absolute: 0.2 10*3/uL (ref 0.0–0.5)
HEMATOCRIT: 33.2 % — AB (ref 34.8–46.6)
HGB: 11.4 g/dL — ABNORMAL LOW (ref 11.6–15.9)
LYMPH%: 22.4 % (ref 14.0–49.7)
MCH: 34.9 pg — ABNORMAL HIGH (ref 25.1–34.0)
MCHC: 34.5 g/dL (ref 31.5–36.0)
MCV: 101.2 fL — ABNORMAL HIGH (ref 79.5–101.0)
MONO#: 0.6 10*3/uL (ref 0.1–0.9)
MONO%: 8.1 % (ref 0.0–14.0)
NEUT#: 4.8 10*3/uL (ref 1.5–6.5)
NEUT%: 65.9 % (ref 38.4–76.8)
Platelets: 213 10*3/uL (ref 145–400)
RBC: 3.28 10*6/uL — AB (ref 3.70–5.45)
RDW: 15 % — ABNORMAL HIGH (ref 11.2–14.5)
WBC: 7.2 10*3/uL (ref 3.9–10.3)
lymph#: 1.6 10*3/uL (ref 0.9–3.3)

## 2017-04-09 MED ORDER — HYDROMORPHONE HCL 1 MG/ML IJ SOLN
INTRAMUSCULAR | Status: AC
  Filled 2017-04-09: qty 1

## 2017-04-09 MED ORDER — HEPARIN SOD (PORK) LOCK FLUSH 100 UNIT/ML IV SOLN
500.0000 [IU] | Freq: Once | INTRAVENOUS | Status: DC | PRN
  Filled 2017-04-09: qty 5

## 2017-04-09 MED ORDER — HEPARIN SOD (PORK) LOCK FLUSH 100 UNIT/ML IV SOLN: 500.0000 [IU] | Freq: Once | INTRAVENOUS | Status: DC

## 2017-04-09 MED ORDER — SODIUM CHLORIDE 0.9% FLUSH
10.0000 mL | INTRAVENOUS | Status: DC | PRN
  Administered 2017-04-09: 10 mL
  Filled 2017-04-09: qty 10

## 2017-04-09 MED ORDER — IOPAMIDOL (ISOVUE-300) INJECTION 61%
INTRAVENOUS | Status: AC
  Filled 2017-04-09: qty 100

## 2017-04-09 MED ORDER — HEPARIN SOD (PORK) LOCK FLUSH 100 UNIT/ML IV SOLN
INTRAVENOUS | Status: AC
  Administered 2017-04-09: 500 [IU]
  Filled 2017-04-09: qty 5

## 2017-04-09 MED ORDER — ONDANSETRON HCL 8 MG PO TABS
ORAL_TABLET | ORAL | Status: AC
  Filled 2017-04-09: qty 1

## 2017-04-09 MED ORDER — IOPAMIDOL (ISOVUE-300) INJECTION 61%
75.0000 mL | Freq: Once | INTRAVENOUS | Status: AC | PRN
  Administered 2017-04-09: 75 mL via INTRAVENOUS

## 2017-04-09 MED ORDER — DIPHENHYDRAMINE HCL 50 MG/ML IJ SOLN
INTRAMUSCULAR | Status: AC
  Filled 2017-04-09: qty 1

## 2017-04-09 NOTE — Patient Instructions (Signed)
Implanted Port Home Guide An implanted port is a type of central line that is placed under the skin. Central lines are used to provide IV access when treatment or nutrition needs to be given through a person's veins. Implanted ports are used for long-term IV access. An implanted port may be placed because:  You need IV medicine that would be irritating to the small veins in your hands or arms.  You need long-term IV medicines, such as antibiotics.  You need IV nutrition for a long period.  You need frequent blood draws for lab tests.  You need dialysis.  Implanted ports are usually placed in the chest area, but they can also be placed in the upper arm, the abdomen, or the leg. An implanted port has two main parts:  Reservoir. The reservoir is round and will appear as a small, raised area under your skin. The reservoir is the part where a needle is inserted to give medicines or draw blood.  Catheter. The catheter is a thin, flexible tube that extends from the reservoir. The catheter is placed into a large vein. Medicine that is inserted into the reservoir goes into the catheter and then into the vein.  How will I care for my incision site? Do not get the incision site wet. Bathe or shower as directed by your health care provider. How is my port accessed? Special steps must be taken to access the port:  Before the port is accessed, a numbing cream can be placed on the skin. This helps numb the skin over the port site.  Your health care provider uses a sterile technique to access the port. ? Your health care provider must put on a mask and sterile gloves. ? The skin over your port is cleaned carefully with an antiseptic and allowed to dry. ? The port is gently pinched between sterile gloves, and a needle is inserted into the port.  Only "non-coring" port needles should be used to access the port. Once the port is accessed, a blood return should be checked. This helps ensure that the port  is in the vein and is not clogged.  If your port needs to remain accessed for a constant infusion, a clear (transparent) bandage will be placed over the needle site. The bandage and needle will need to be changed every week, or as directed by your health care provider.  Keep the bandage covering the needle clean and dry. Do not get it wet. Follow your health care provider's instructions on how to take a shower or bath while the port is accessed.  If your port does not need to stay accessed, no bandage is needed over the port.  What is flushing? Flushing helps keep the port from getting clogged. Follow your health care provider's instructions on how and when to flush the port. Ports are usually flushed with saline solution or a medicine called heparin. The need for flushing will depend on how the port is used.  If the port is used for intermittent medicines or blood draws, the port will need to be flushed: ? After medicines have been given. ? After blood has been drawn. ? As part of routine maintenance.  If a constant infusion is running, the port may not need to be flushed.  How long will my port stay implanted? The port can stay in for as long as your health care provider thinks it is needed. When it is time for the port to come out, surgery will be   done to remove it. The procedure is similar to the one performed when the port was put in. When should I seek immediate medical care? When you have an implanted port, you should seek immediate medical care if:  You notice a bad smell coming from the incision site.  You have swelling, redness, or drainage at the incision site.  You have more swelling or pain at the port site or the surrounding area.  You have a fever that is not controlled with medicine.  This information is not intended to replace advice given to you by your health care provider. Make sure you discuss any questions you have with your health care provider. Document  Released: 08/21/2005 Document Revised: 01/27/2016 Document Reviewed: 04/28/2013 Elsevier Interactive Patient Education  2017 [employer]  

## 2017-04-12 ENCOUNTER — Encounter: Payer: Self-pay | Admitting: Internal Medicine

## 2017-04-12 ENCOUNTER — Ambulatory Visit (HOSPITAL_BASED_OUTPATIENT_CLINIC_OR_DEPARTMENT_OTHER): Payer: Medicare Other | Admitting: Internal Medicine

## 2017-04-12 ENCOUNTER — Telehealth: Payer: Self-pay | Admitting: Internal Medicine

## 2017-04-12 VITALS — BP 144/83 | HR 85 | Temp 98.4°F | Resp 18 | Ht 58.5 in | Wt 159.2 lb

## 2017-04-12 DIAGNOSIS — R530 Neoplastic (malignant) related fatigue: Secondary | ICD-10-CM | POA: Diagnosis not present

## 2017-04-12 DIAGNOSIS — J918 Pleural effusion in other conditions classified elsewhere: Secondary | ICD-10-CM

## 2017-04-12 DIAGNOSIS — C342 Malignant neoplasm of middle lobe, bronchus or lung: Secondary | ICD-10-CM

## 2017-04-12 DIAGNOSIS — G620 Drug-induced polyneuropathy: Secondary | ICD-10-CM | POA: Diagnosis not present

## 2017-04-12 DIAGNOSIS — N2889 Other specified disorders of kidney and ureter: Secondary | ICD-10-CM | POA: Diagnosis not present

## 2017-04-12 HISTORY — DX: Other specified disorders of kidney and ureter: N28.89

## 2017-04-12 NOTE — Telephone Encounter (Signed)
Scheduled appt per 8/9 los - Gave patient AVS and calender per los.  

## 2017-04-12 NOTE — Progress Notes (Signed)
[place] [phone]   Fax:([phone]) [place], MD [address] [name] [address]  DIAGNOSIS: Recurrent non-small cell lung cancer, squamous cell carcinoma initially diagnosed as unresectable a stage IIIa in February 2015.  PRIOR THERAPY: 1) Status post exploratory right VATS with mediastinal biopsy under the care of Dr. Roxan Hockey on 11/13/2013. The tumor was found to be unresectable at that time.[name] 2) Concurrent chemoradiation with weekly carboplatin for AUC of 2 and paclitaxel 45 mg/M2, status post 6 cycles with partial response. First cycle was given on 12/01/2013. 3) Consolidation chemotherapy with carboplatin for AUC of 5 and paclitaxel 175 mg/M2 every 3 weeks with Neulasta support. First dose 02/23/2014. Status post 3 cycles with partial response. 4) Systemic chemotherapy with carboplatin for AUC of 5 and paclitaxel 175 MG/M2 every 3 weeks with Neulasta support. Status post 6 cycles. First dose was given on 08/14/2016.  CURRENT THERAPY: Observation.  INTERVAL HISTORY: Kimberly [name] 76 y.o. female returns to the clinic today for follow-up visit. The patient is feeling fine today with no specific complaints. She denied having any chest pain, shortness of breath, cough or hemoptysis. She has no fever or chills. She denied having any weight loss or night sweats. The patient has no nausea, vomiting, diarrhea or constipation. She has been on observation for the last 3 months and she is feeling fine. She had repeat CT scan of the chest performed recently and she is here for evaluation and discussion of her scan results.  MEDICAL HISTORY: Past Medical History:  Diagnosis Date  . Allergic asthma without acute exacerbation or status asthmaticus   . Aortic insufficiency   . Arthritis   . Atrial fibrillation ([place])   . Back pain 08/14/2016  . Bronchitis   . Cough    dry, endobronchial mass  .  Dyslipidemia   . Family history of adverse reaction to anesthesia    pts son also experiences N/V  . GERD (gastroesophageal reflux disease)   . Heart murmur   . History of blood transfusion   . History of bronchitis   . History of chemotherapy   . History of nuclear stress test 02/26/2006   exercise myoview; normal pattern of perfusion; low risk scan   . Hypertension   . Hypothyroidism   . Mitral insufficiency   . Pneumonia   . PONV (postoperative nausea and vomiting)   . Radiation 12/01/13-01/08/14   50.4 gray to right central chest  . Shortness of breath    with exertion  . Squamous cell carcinoma of lung ([place])   . Syncope    "states she has passed out a few times. dr is trying to find cause.last time when geeting ready to go home after video bronch/bx  . Thyroid disease     ALLERGIES:  is allergic to lactose intolerance (gi); amiodarone; augmentin [amoxicillin-pot clavulanate]; and milk-related compounds.  MEDICATIONS:  Current Outpatient Prescriptions  Medication Sig Dispense Refill  . albuterol (PROVENTIL HFA;VENTOLIN HFA) 108 (90 BASE) MCG/ACT inhaler Inhale 1 puff into the lungs every 6 (six) hours as needed for wheezing or shortness of breath.     [name] atorvastatin (LIPITOR) 20 MG tablet Take 20 mg by mouth every morning.     . benzonatate (TESSALON) 100 MG capsule Take 1 capsule (100 mg total) by mouth 3 (three) times daily as needed for cough. 20 capsule 0  . chlorthalidone (HYGROTON) 25 MG tablet TAKE 1 TABLET  BY MOUTH  DAILY 90 tablet 1  . dextromethorphan-guaiFENesin (MUCINEX DM) 30-600 MG 12hr tablet Take 1 tablet by mouth 2 (two) times daily. 10 tablet 0  . feeding supplement, ENSURE ENLIVE, (ENSURE ENLIVE) LIQD Take 237 mLs by mouth 2 (two) times daily between meals. 237 mL 12  . ferrous sulfate 325 (65 FE) MG tablet Take 1 tablet (325 mg total) by mouth 2 (two) times daily with a meal. 60 tablet 1  . levothyroxine (SYNTHROID, LEVOTHROID) 150 MCG tablet Take 150 mcg by  mouth as directed. Take 1 tablet once every Tuesday, Thursday, Saturday, and Sunday.    . lidocaine-prilocaine (EMLA) cream Apply 1 application topically as needed. (Patient taking differently: Apply 1 application topically as needed (for port access). ) 30 g 0  . loperamide (IMODIUM) 2 MG capsule Take 2 mg by mouth as needed for diarrhea or loose stools.    . metoprolol succinate (TOPROL-XL) 25 MG 24 hr tablet TAKE ONE-HALF TABLET BY  MOUTH DAILY 45 tablet 1  . montelukast (SINGULAIR) 10 MG tablet Take 10 mg by mouth at bedtime.     . Multiple Vitamins-Iron (MULTIVITAMIN/IRON PO) Take 1 tablet by mouth daily with breakfast.     . pantoprazole (PROTONIX) 40 MG tablet Take 1 tablet (40 mg total) by mouth 2 (two) times daily before a meal. 60 tablet 0   No current facility-administered medications for this visit.     SURGICAL HISTORY:  Past Surgical History:  Procedure Laterality Date  . CARDIAC CATHETERIZATION  07/08/2008   normal coronaries  . CARPAL TUNNEL RELEASE Right 1988  . COLONOSCOPY N/A 02/11/2016   Procedure: COLONOSCOPY;  Surgeon: Carol Ada, MD;  Location: [facility];  Service: Endoscopy;  Laterality: N/A;  . DILATION AND CURETTAGE OF UTERUS    . ESOPHAGOGASTRODUODENOSCOPY N/A 02/08/2017   Procedure: ESOPHAGOGASTRODUODENOSCOPY (EGD);  Surgeon: Carol Ada, MD;  Location: [facility];  Service: Endoscopy;  Laterality: N/A;  . EYE SURGERY Bilateral   . H/O MET Test w/PFT  04/02/2012   low risk; peak VO2 77% predicted  . IR GENERIC HISTORICAL  09/12/2016   IR FLUORO GUIDE PORT INSERTION RIGHT 09/12/2016 [name], MD [facility]  . IR GENERIC HISTORICAL  09/12/2016   IR US GUIDE VASC ACCESS RIGHT 09/12/2016 [name], MD [facility]  . JOINT REPLACEMENT  2003   thumb rt  . KNEE ARTHROSCOPY  12   rt meniscus  . MEDIASTINOSCOPY N/A 10/30/2013   Procedure: MEDIASTINOSCOPY;  Surgeon: Melrose Nakayama, MD;  Location: [facility];  Service: Thoracic;  Laterality: N/A;  .  ROTATOR CUFF REPAIR  2006   ? side  . THYROIDECTOMY  1973  . TRANSTHORACIC ECHOCARDIOGRAM  09/25/2012   EF 55-60%; mild LVH & mild concentric hypertrophy; mild AV regurg; RV systolic pressure increase consistent with mild pulm HTN  . VIDEO ASSISTED THORACOSCOPY (VATS)/ LOBECTOMY Right 11/13/2013   Procedure: VIDEO ASSISTED THORACOSCOPY (VATS) with mediastinal  biopsies;  Surgeon: Melrose Nakayama, MD;  Location: [facility];  Service: Thoracic;  Laterality: Right;  RIGHT VATS,mediastinal biopsies  . VIDEO BRONCHOSCOPY Bilateral 09/25/2013   Procedure: VIDEO BRONCHOSCOPY WITHOUT FLUORO;  Surgeon: Tanda Rockers, MD;  Location: [facility];  Service: Cardiopulmonary;  Laterality: Bilateral;  . VIDEO BRONCHOSCOPY WITH ENDOBRONCHIAL ULTRASOUND N/A 10/30/2013   Procedure: VIDEO BRONCHOSCOPY WITH ENDOBRONCHIAL ULTRASOUND;  Surgeon: Melrose Nakayama, MD;  Location: [facility];  Service: Thoracic;  Laterality: N/A;    REVIEW OF SYSTEMS:  Constitutional: negative Eyes: negative Ears, nose,  mouth, throat, and face: negative Respiratory: negative Cardiovascular: negative Gastrointestinal: negative Genitourinary:negative Integument/breast: negative Hematologic/lymphatic: negative Musculoskeletal:positive for back pain Neurological: positive for paresthesia Behavioral/Psych: negative Endocrine: negative Allergic/Immunologic: negative   PHYSICAL EXAMINATION: General appearance: alert, cooperative and no distress Head: Normocephalic, without obvious abnormality, atraumatic Neck: no adenopathy, no JVD, supple, symmetrical, trachea midline and thyroid not enlarged, symmetric, no tenderness/mass/nodules Lymph nodes: Cervical, supraclavicular, and axillary nodes normal. Resp: clear to auscultation bilaterally Back: symmetric, no curvature. ROM normal. No CVA tenderness. Cardio: regular rate and rhythm, S1, S2 normal, no murmur, click, rub or gallop GI: soft, non-tender; bowel sounds normal; no masses,  no  organomegaly Extremities: extremities normal, atraumatic, no cyanosis or edema Neurologic: Alert and oriented X 3, normal strength and tone. Normal symmetric reflexes. Normal coordination and gait  ECOG PERFORMANCE STATUS: 1 - Symptomatic but completely ambulatory  Blood pressure (!) 144/83, pulse 85, temperature 98.4 F (36.9 C), temperature source Oral, resp. rate 18, height 4' 10.5" (1.486 m), weight 159 lb 3.2 oz (72.2 kg), SpO2 100 %.  LABORATORY DATA: Lab Results  Component Value Date   WBC 7.2 04/09/2017   HGB 11.4 (L) 04/09/2017   HCT 33.2 (L) 04/09/2017   MCV 101.2 (H) 04/09/2017   PLT 213 04/09/2017      Chemistry      Component Value Date/Time   NA 139 04/09/2017 1244   K 4.2 04/09/2017 1244   CL 105 02/09/2017 0353   CO2 28 04/09/2017 1244   BUN 20.1 04/09/2017 1244   CREATININE 0.9 04/09/2017 1244      Component Value Date/Time   CALCIUM 10.0 04/09/2017 1244   ALKPHOS 106 04/09/2017 1244   AST 22 04/09/2017 1244   ALT 18 04/09/2017 1244   BILITOT 0.59 04/09/2017 1244       RADIOGRAPHIC STUDIES: Ct Chest W Contrast  Result Date: 04/09/2017 CLINICAL DATA:  Right lung cancer with radiation therapy and chemotherapy. Chronic cough and shortness of breath. EXAM: CT CHEST WITH CONTRAST TECHNIQUE: Multidetector CT imaging of the chest was performed during intravenous contrast administration. CONTRAST:  36m ISOVUE-300 IOPAMIDOL (ISOVUE-300) INJECTION 61% COMPARISON:  01/05/2017. FINDINGS: Cardiovascular: Right IJ Port-A-Cath terminates in the SVC. Atherosclerotic calcification of the arterial vasculature, including coronary arteries. Heart size normal. No pericardial effusion. Mediastinum/Nodes: Left thyroidectomy. Right thyroid lobe is small. Mediastinal lymph nodes are not enlarged by CT size criteria. Right hilum is difficult to evaluate due to post radiation changes. No left hilar or axillary adenopathy. Esophagus is grossly unremarkable. Lungs/Pleura: Post radiation  collapse/consolidation in the right upper and right middle lobes, as before. Small right pleural effusion, new. Calcified granulomas in the left lower lobe. Nodular densities measuring up to 4 mm in the left lung, unchanged. Airway is otherwise unremarkable. Upper Abdomen: Visualized portions of the liver, gallbladder and adrenal glands are unremarkable. There are 2 heterogeneous lesions along the posterolateral aspect of the right kidney, measuring approximately 1 cm, too small to characterize. The inferior lesion is incompletely visualized. Visualized portions of the left kidney, spleen, pancreas, stomach and bowel are grossly unremarkable with the exception of a tiny hiatal hernia. No upper abdominal adenopathy. Musculoskeletal: Degenerative changes in the spine. No worrisome lytic or sclerotic lesions. Lipoma in the left shoulder musculature. IMPRESSION: 1. New small right pleural effusion. Otherwise, no evidence of recurrent or metastatic disease. 2. Two heterogeneous 1 cm lesions in the right kidney. Renal cell carcinoma cannot be excluded. Further evaluation with pre and post contrast MRI should be considered. Pre and  post contrast CT could alternatively be performed, but would likely be of decreased accuracy given lesion size. 3. Aortic atherosclerosis (ICD10-170.0). Coronary artery calcification. Electronically Signed   By: Lorin Picket M.D.   On: 04/09/2017 16:33    ASSESSMENT AND PLAN: This is a very pleasant 76 years old white female with recurrent non-small cell lung cancer, squamous cell carcinoma. The patient completed 6 cycles of systemic chemotherapy was carboplatin and paclitaxel and tolerated her treatment well except for fatigue as well as peripheral neuropathy. The patient is currently on observation and she is feeling fine. Repeat CT scan of the chest showed new small right pleural effusion but no evidence of recurrence or metastatic disease. The scan also showed 2 heterogeneous 1.0 cm  lesions in the right kidney and renal cell carcinoma could not be excluded. I personally and independently reviewed the scan images and discuss the results with the patient today. I discussed the scan results with the patient today. I recommended for her to have MRI of the abdomen for further evaluation of the kidney lesion and to rule out renal cell carcinoma. I recommended for her to continue on observation with repeat CT scan of the chest in 3 months. For hypertension the patient will continue her current treatment with Toprol-XL. She was advised to call immediately if she has any concerning symptoms in the interval. The patient voices understanding of current disease status and treatment options and is in agreement with the current care plan. All questions were answered. The patient knows to call the clinic with any problems, questions or concerns. We can certainly see the patient much sooner if necessary. I spent 15 minutes counseling the patient face to face. The total time spent in the appointment was 25 minutes.  Disclaimer: This note was dictated with voice recognition software. Similar sounding words can inadvertently be transcribed and may not be corrected upon review.

## 2017-04-19 ENCOUNTER — Other Ambulatory Visit: Payer: Self-pay | Admitting: Internal Medicine

## 2017-04-19 ENCOUNTER — Ambulatory Visit (HOSPITAL_COMMUNITY)
Admission: RE | Admit: 2017-04-19 | Discharge: 2017-04-19 | Disposition: A | Discharge status: Home / Self Care | Payer: Medicare Other | Source: Ambulatory Visit | Attending: Internal Medicine | Admitting: Internal Medicine

## 2017-04-19 ENCOUNTER — Encounter (HOSPITAL_COMMUNITY): Payer: Self-pay

## 2017-04-19 DIAGNOSIS — C342 Malignant neoplasm of middle lobe, bronchus or lung: Secondary | ICD-10-CM

## 2017-04-19 DIAGNOSIS — N2889 Other specified disorders of kidney and ureter: Secondary | ICD-10-CM | POA: Insufficient documentation

## 2017-04-19 DIAGNOSIS — R938 Abnormal findings on diagnostic imaging of other specified body structures: Secondary | ICD-10-CM | POA: Insufficient documentation

## 2017-04-19 MED ORDER — HEPARIN SOD (PORK) LOCK FLUSH 10 UNIT/ML IV SOLN
5.0000 [IU] | Freq: Once | INTRAVENOUS | Status: DC
  Filled 2017-04-19: qty 0.5

## 2017-04-19 MED ORDER — IOPAMIDOL (ISOVUE-300) INJECTION 61%: 75.0000 mL | Freq: Once | INTRAVENOUS | Status: DC | PRN

## 2017-04-19 MED ORDER — IOPAMIDOL (ISOVUE-300) INJECTION 61%
INTRAVENOUS | Status: AC
  Filled 2017-04-19: qty 75

## 2017-04-19 MED ORDER — HEPARIN SOD (PORK) LOCK FLUSH 100 UNIT/ML IV SOLN
INTRAVENOUS | Status: AC
  Administered 2017-04-19: 500 [IU]
  Filled 2017-04-19: qty 5

## 2017-04-19 MED ORDER — GADOBENATE DIMEGLUMINE 529 MG/ML IV SOLN
15.0000 mL | Freq: Once | INTRAVENOUS | Status: AC | PRN
  Administered 2017-04-19: 14 mL via INTRAVENOUS

## 2017-04-20 ENCOUNTER — Other Ambulatory Visit: Payer: Self-pay | Admitting: Internal Medicine

## 2017-04-20 DIAGNOSIS — N2889 Other specified disorders of kidney and ureter: Secondary | ICD-10-CM

## 2017-04-20 DIAGNOSIS — C342 Malignant neoplasm of middle lobe, bronchus or lung: Secondary | ICD-10-CM

## 2017-04-24 ENCOUNTER — Telehealth: Payer: Self-pay | Admitting: Medical Oncology

## 2017-04-24 NOTE — Telephone Encounter (Signed)
Requests results of mri

## 2017-04-24 NOTE — Telephone Encounter (Signed)
The spot in her kidney is suspicious. She need a referral to Urology. I think I made the referral but if not please enter a new one.

## 2017-04-25 ENCOUNTER — Telehealth: Payer: Self-pay | Admitting: Medical Oncology

## 2017-04-25 NOTE — Telephone Encounter (Signed)
Pt notified, Per Dr Julien Nordmann . "The spot in her kidney is suspicious. Referred to Urology". I told her to expect a call from urology and to call back Friday if she has not heard anything.

## 2017-04-27 ENCOUNTER — Telehealth: Payer: Self-pay | Admitting: Medical Oncology

## 2017-04-27 NOTE — Telephone Encounter (Signed)
I contacted [facility] re referral appt. They left message for pt to call. Pt notified.

## 2017-05-09 DIAGNOSIS — N2889 Other specified disorders of kidney and ureter: Secondary | ICD-10-CM | POA: Diagnosis not present

## 2017-05-11 DIAGNOSIS — N289 Disorder of kidney and ureter, unspecified: Secondary | ICD-10-CM | POA: Diagnosis not present

## 2017-05-24 ENCOUNTER — Ambulatory Visit (HOSPITAL_BASED_OUTPATIENT_CLINIC_OR_DEPARTMENT_OTHER): Payer: Medicare Other

## 2017-05-24 DIAGNOSIS — C342 Malignant neoplasm of middle lobe, bronchus or lung: Secondary | ICD-10-CM | POA: Diagnosis not present

## 2017-05-24 DIAGNOSIS — E860 Dehydration: Secondary | ICD-10-CM

## 2017-05-24 MED ORDER — HEPARIN SOD (PORK) LOCK FLUSH 100 UNIT/ML IV SOLN
500.0000 [IU] | Freq: Once | INTRAVENOUS | Status: AC | PRN
  Administered 2017-05-24: 500 [IU]
  Filled 2017-05-24: qty 5

## 2017-05-24 MED ORDER — SODIUM CHLORIDE 0.9% FLUSH
10.0000 mL | INTRAVENOUS | Status: DC | PRN
  Administered 2017-05-24: 10 mL
  Filled 2017-05-24: qty 10

## 2017-05-28 DIAGNOSIS — Z1231 Encounter for screening mammogram for malignant neoplasm of breast: Secondary | ICD-10-CM | POA: Diagnosis not present

## 2017-06-01 DIAGNOSIS — R928 Other abnormal and inconclusive findings on diagnostic imaging of breast: Secondary | ICD-10-CM | POA: Diagnosis not present

## 2017-06-04 DIAGNOSIS — Z23 Encounter for immunization: Secondary | ICD-10-CM | POA: Diagnosis not present

## 2017-06-04 DIAGNOSIS — R748 Abnormal levels of other serum enzymes: Secondary | ICD-10-CM | POA: Diagnosis not present

## 2017-06-04 DIAGNOSIS — I10 Essential (primary) hypertension: Secondary | ICD-10-CM | POA: Diagnosis not present

## 2017-06-04 DIAGNOSIS — R945 Abnormal results of liver function studies: Secondary | ICD-10-CM | POA: Diagnosis not present

## 2017-06-04 DIAGNOSIS — Z79899 Other long term (current) drug therapy: Secondary | ICD-10-CM | POA: Diagnosis not present

## 2017-06-04 DIAGNOSIS — Z0000 Encounter for general adult medical examination without abnormal findings: Secondary | ICD-10-CM | POA: Diagnosis not present

## 2017-06-04 DIAGNOSIS — D649 Anemia, unspecified: Secondary | ICD-10-CM | POA: Diagnosis not present

## 2017-06-04 DIAGNOSIS — E7800 Pure hypercholesterolemia, unspecified: Secondary | ICD-10-CM | POA: Diagnosis not present

## 2017-06-04 DIAGNOSIS — M810 Age-related osteoporosis without current pathological fracture: Secondary | ICD-10-CM | POA: Diagnosis not present

## 2017-06-04 DIAGNOSIS — E039 Hypothyroidism, unspecified: Secondary | ICD-10-CM | POA: Diagnosis not present

## 2017-06-04 DIAGNOSIS — E1122 Type 2 diabetes mellitus with diabetic chronic kidney disease: Secondary | ICD-10-CM | POA: Diagnosis not present

## 2017-06-07 DIAGNOSIS — M5441 Lumbago with sciatica, right side: Secondary | ICD-10-CM | POA: Diagnosis not present

## 2017-06-07 DIAGNOSIS — E039 Hypothyroidism, unspecified: Secondary | ICD-10-CM | POA: Diagnosis not present

## 2017-06-07 DIAGNOSIS — I10 Essential (primary) hypertension: Secondary | ICD-10-CM | POA: Diagnosis not present

## 2017-06-07 DIAGNOSIS — C3490 Malignant neoplasm of unspecified part of unspecified bronchus or lung: Secondary | ICD-10-CM | POA: Diagnosis not present

## 2017-06-12 DIAGNOSIS — M545 Low back pain: Secondary | ICD-10-CM | POA: Diagnosis not present

## 2017-06-12 DIAGNOSIS — M5432 Sciatica, left side: Secondary | ICD-10-CM | POA: Diagnosis not present

## 2017-07-04 ENCOUNTER — Other Ambulatory Visit: Payer: Self-pay | Admitting: Internal Medicine

## 2017-07-04 DIAGNOSIS — C342 Malignant neoplasm of middle lobe, bronchus or lung: Secondary | ICD-10-CM

## 2017-07-05 DIAGNOSIS — M5136 Other intervertebral disc degeneration, lumbar region: Secondary | ICD-10-CM | POA: Diagnosis not present

## 2017-07-12 ENCOUNTER — Other Ambulatory Visit: Payer: Medicare Other

## 2017-07-12 ENCOUNTER — Encounter (HOSPITAL_COMMUNITY): Payer: Self-pay

## 2017-07-12 ENCOUNTER — Ambulatory Visit (HOSPITAL_BASED_OUTPATIENT_CLINIC_OR_DEPARTMENT_OTHER): Payer: Medicare Other

## 2017-07-12 ENCOUNTER — Ambulatory Visit (HOSPITAL_COMMUNITY)
Admission: RE | Admit: 2017-07-12 | Discharge: 2017-07-12 | Disposition: A | Discharge status: Home / Self Care | Payer: Medicare Other | Source: Ambulatory Visit | Attending: Internal Medicine | Admitting: Internal Medicine

## 2017-07-12 ENCOUNTER — Other Ambulatory Visit (HOSPITAL_BASED_OUTPATIENT_CLINIC_OR_DEPARTMENT_OTHER): Payer: Medicare Other

## 2017-07-12 DIAGNOSIS — I700 Atherosclerosis of aorta: Secondary | ICD-10-CM | POA: Insufficient documentation

## 2017-07-12 DIAGNOSIS — E860 Dehydration: Secondary | ICD-10-CM

## 2017-07-12 DIAGNOSIS — N2889 Other specified disorders of kidney and ureter: Secondary | ICD-10-CM | POA: Insufficient documentation

## 2017-07-12 DIAGNOSIS — C342 Malignant neoplasm of middle lobe, bronchus or lung: Secondary | ICD-10-CM | POA: Insufficient documentation

## 2017-07-12 DIAGNOSIS — I2510 Atherosclerotic heart disease of native coronary artery without angina pectoris: Secondary | ICD-10-CM | POA: Insufficient documentation

## 2017-07-12 DIAGNOSIS — K449 Diaphragmatic hernia without obstruction or gangrene: Secondary | ICD-10-CM | POA: Diagnosis not present

## 2017-07-12 DIAGNOSIS — J701 Chronic and other pulmonary manifestations due to radiation: Secondary | ICD-10-CM | POA: Insufficient documentation

## 2017-07-12 DIAGNOSIS — J8410 Pulmonary fibrosis, unspecified: Secondary | ICD-10-CM | POA: Diagnosis not present

## 2017-07-12 LAB — COMPREHENSIVE METABOLIC PANEL
ALBUMIN: 3.5 g/dL (ref 3.5–5.0)
ALK PHOS: 119 U/L (ref 40–150)
ALT: 15 U/L (ref 0–55)
ANION GAP: 11 meq/L (ref 3–11)
AST: 16 U/L (ref 5–34)
BILIRUBIN TOTAL: 0.48 mg/dL (ref 0.20–1.20)
BUN: 27.5 mg/dL — ABNORMAL HIGH (ref 7.0–26.0)
CO2: 27 meq/L (ref 22–29)
CREATININE: 1 mg/dL (ref 0.6–1.1)
Calcium: 10 mg/dL (ref 8.4–10.4)
Chloride: 102 mEq/L (ref 98–109)
EGFR: 58 mL/min/{1.73_m2} — AB (ref >60)
GLUCOSE: 102 mg/dL (ref 70–140)
Potassium: 3.5 mEq/L (ref 3.5–5.1)
Sodium: 140 mEq/L (ref 136–145)
TOTAL PROTEIN: 6.9 g/dL (ref 6.4–8.3)

## 2017-07-12 LAB — CBC WITH DIFFERENTIAL/PLATELET
BASO%: 0.8 % (ref 0.0–2.0)
BASOS ABS: 0.1 10*3/uL (ref 0.0–0.1)
EOS%: 3 % (ref 0.0–7.0)
Eosinophils Absolute: 0.2 10*3/uL (ref 0.0–0.5)
HCT: 33.1 % — ABNORMAL LOW (ref 34.8–46.6)
HGB: 11.3 g/dL — ABNORMAL LOW (ref 11.6–15.9)
LYMPH#: 1.4 10*3/uL (ref 0.9–3.3)
LYMPH%: 19.2 % (ref 14.0–49.7)
MCH: 34.9 pg — AB (ref 25.1–34.0)
MCHC: 34.2 g/dL (ref 31.5–36.0)
MCV: 101.9 fL — ABNORMAL HIGH (ref 79.5–101.0)
MONO#: 0.6 10*3/uL (ref 0.1–0.9)
MONO%: 8.4 % (ref 0.0–14.0)
NEUT#: 4.9 10*3/uL (ref 1.5–6.5)
NEUT%: 68.6 % (ref 38.4–76.8)
Platelets: 251 10*3/uL (ref 145–400)
RBC: 3.25 10*6/uL — AB (ref 3.70–5.45)
RDW: 12.3 % (ref 11.2–14.5)
WBC: 7.1 10*3/uL (ref 3.9–10.3)

## 2017-07-12 MED ORDER — SODIUM CHLORIDE 0.9% FLUSH
10.0000 mL | INTRAVENOUS | Status: DC | PRN
  Administered 2017-07-12: 10 mL
  Filled 2017-07-12: qty 10

## 2017-07-12 MED ORDER — IOPAMIDOL (ISOVUE-300) INJECTION 61%
75.0000 mL | Freq: Once | INTRAVENOUS | Status: AC | PRN
  Administered 2017-07-12: 75 mL via INTRAVENOUS

## 2017-07-12 MED ORDER — IOPAMIDOL (ISOVUE-300) INJECTION 61%
INTRAVENOUS | Status: AC
  Filled 2017-07-12: qty 75

## 2017-07-12 MED ORDER — HEPARIN SOD (PORK) LOCK FLUSH 100 UNIT/ML IV SOLN
INTRAVENOUS | Status: AC
  Administered 2017-07-12: 500 [IU]
  Filled 2017-07-12: qty 5

## 2017-07-18 DIAGNOSIS — E039 Hypothyroidism, unspecified: Secondary | ICD-10-CM | POA: Diagnosis not present

## 2017-07-19 ENCOUNTER — Telehealth: Payer: Self-pay | Admitting: Internal Medicine

## 2017-07-19 ENCOUNTER — Encounter: Payer: Self-pay | Admitting: *Deleted

## 2017-07-19 ENCOUNTER — Ambulatory Visit (HOSPITAL_BASED_OUTPATIENT_CLINIC_OR_DEPARTMENT_OTHER): Payer: Medicare Other | Admitting: Internal Medicine

## 2017-07-19 ENCOUNTER — Encounter: Payer: Self-pay | Admitting: Internal Medicine

## 2017-07-19 VITALS — BP 162/72 | HR 85 | Temp 98.4°F | Resp 18 | Ht 58.5 in | Wt 162.7 lb

## 2017-07-19 DIAGNOSIS — Z85118 Personal history of other malignant neoplasm of bronchus and lung: Secondary | ICD-10-CM

## 2017-07-19 DIAGNOSIS — I10 Essential (primary) hypertension: Secondary | ICD-10-CM | POA: Diagnosis not present

## 2017-07-19 DIAGNOSIS — C342 Malignant neoplasm of middle lobe, bronchus or lung: Secondary | ICD-10-CM

## 2017-07-19 DIAGNOSIS — G8929 Other chronic pain: Secondary | ICD-10-CM | POA: Diagnosis not present

## 2017-07-19 DIAGNOSIS — N289 Disorder of kidney and ureter, unspecified: Secondary | ICD-10-CM | POA: Diagnosis not present

## 2017-07-19 DIAGNOSIS — Z9221 Personal history of antineoplastic chemotherapy: Secondary | ICD-10-CM

## 2017-07-19 DIAGNOSIS — M545 Low back pain: Secondary | ICD-10-CM

## 2017-07-19 NOTE — Progress Notes (Signed)
[place] [phone]   Fax:([phone]) [place], [place] [address] [place] [address]  DIAGNOSIS: Recurrent non-small cell lung cancer, squamous cell carcinoma initially diagnosed as unresectable a stage IIIa in February 2015.  PRIOR THERAPY: 1) Status post exploratory right VATS with mediastinal biopsy under the care of Dr. Roxan Hockey on 11/13/2013. The tumor was found to be unresectable at that time.[name] 2) Concurrent chemoradiation with weekly carboplatin for AUC of 2 and paclitaxel 45 mg/M2, status post 6 cycles with partial response. First cycle was given on 12/01/2013. 3) Consolidation chemotherapy with carboplatin for AUC of 5 and paclitaxel 175 mg/M2 every 3 weeks with Neulasta support. First dose 02/23/2014. Status post 3 cycles with partial response. 4) Systemic chemotherapy with carboplatin for AUC of 5 and paclitaxel 175 MG/M2 every 3 weeks with Neulasta support. Status post 6 cycles. First dose was given on 08/14/2016.  CURRENT THERAPY: Observation.  INTERVAL HISTORY: Kimberly [name] 76 y.o. female returns to the clinic today for 38-monthfollow-up visit.  The patient is feeling fine today with no specific complaints except for mild shortness of breath with exertion.  She was found to have a suspicious right renal mass and she was seen by urology Dr. DRosana Hoesand she is currently on observation.  She was also treated recently for low back pain by Dr. BMaxie Betterand she is currently on tramadol.  The patient denied having any chest pain, cough or hemoptysis.  She denied having any nausea, vomiting, diarrhea or constipation.  She had repeat CT scan of the chest performed recently and she is here for evaluation and discussion of her scan results.  MEDICAL HISTORY: Past Medical History:  Diagnosis Date  . Allergic asthma without acute exacerbation or status asthmaticus   . Aortic insufficiency   . Arthritis    . Atrial fibrillation ([place]   . Back pain 08/14/2016  . Bronchitis   . Cough    dry, endobronchial mass  . Dyslipidemia   . Family history of adverse reaction to anesthesia    pts son also experiences N/V  . GERD (gastroesophageal reflux disease)   . Heart murmur   . History of blood transfusion   . History of bronchitis   . History of chemotherapy   . History of nuclear stress test 02/26/2006   exercise myoview; normal pattern of perfusion; low risk scan   . Hypertension   . Hypothyroidism   . Mitral insufficiency   . Pneumonia   . PONV (postoperative nausea and vomiting)   . Radiation 12/01/13-01/08/14   50.4 gray to right central chest  . Renal mass, left 04/12/2017  . Shortness of breath    with exertion  . Squamous cell carcinoma of lung ([place]   . Syncope    "states she has passed out a few times. dr is trying to find cause.last time when geeting ready to go home after video bronch/bx  . Thyroid disease     ALLERGIES:  is allergic to lactose intolerance (gi); amiodarone; augmentin [amoxicillin-pot clavulanate]; and milk-related compounds.  MEDICATIONS:  Current Outpatient Medications  Medication Sig Dispense Refill  . albuterol (PROVENTIL HFA;VENTOLIN HFA) 108 (90 BASE) MCG/ACT inhaler Inhale 1 puff into the lungs every 6 (six) hours as needed for wheezing or shortness of breath.     .Marland Kitchenatorvastatin (LIPITOR) 20 MG tablet Take 20 mg by mouth every morning.     . benzonatate (TESSALON) 100  MG capsule Take 1 capsule (100 mg total) by mouth 3 (three) times daily as needed for cough. 20 capsule 0  . chlorthalidone (HYGROTON) 25 MG tablet TAKE 1 TABLET BY MOUTH  DAILY 90 tablet 1  . dextromethorphan-guaiFENesin (MUCINEX DM) 30-600 MG 12hr tablet Take 1 tablet by mouth 2 (two) times daily. 10 tablet 0  . feeding supplement, ENSURE ENLIVE, (ENSURE ENLIVE) LIQD Take 237 mLs by mouth 2 (two) times daily between meals. 237 mL 12  . ferrous sulfate 325 (65 FE) MG tablet Take 1 tablet  (325 mg total) by mouth 2 (two) times daily with a meal. 60 tablet 1  . levothyroxine (SYNTHROID, LEVOTHROID) 150 MCG tablet Take 150 mcg by mouth as directed. Take 1 tablet once every Tuesday, Thursday, Saturday, and Sunday.    . lidocaine-prilocaine (EMLA) cream Apply 1 application topically as needed. (Patient taking differently: Apply 1 application topically as needed (for port access). ) 30 g 0  . loperamide (IMODIUM) 2 MG capsule Take 2 mg by mouth as needed for diarrhea or loose stools.    . metoprolol succinate (TOPROL-XL) 25 MG 24 hr tablet TAKE ONE-HALF TABLET BY  MOUTH DAILY 45 tablet 1  . montelukast (SINGULAIR) 10 MG tablet Take 10 mg by mouth at bedtime.     . Multiple Vitamins-Iron (MULTIVITAMIN/IRON PO) Take 1 tablet by mouth daily with breakfast.     . pantoprazole (PROTONIX) 40 MG tablet Take 1 tablet (40 mg total) by mouth 2 (two) times daily before a meal. 60 tablet 0   No current facility-administered medications for this visit.     SURGICAL HISTORY:  Past Surgical History:  Procedure Laterality Date  . CARDIAC CATHETERIZATION  07/08/2008   normal coronaries  . CARPAL TUNNEL RELEASE Right 1988  . COLONOSCOPY N/A 02/11/2016   Procedure: COLONOSCOPY;  Surgeon: Carol Ada, MD;  Location: [facility];  Service: Endoscopy;  Laterality: N/A;  . DILATION AND CURETTAGE OF UTERUS    . ESOPHAGOGASTRODUODENOSCOPY N/A 02/08/2017   Procedure: ESOPHAGOGASTRODUODENOSCOPY (EGD);  Surgeon: Carol Ada, MD;  Location: [facility];  Service: Endoscopy;  Laterality: N/A;  . EYE SURGERY Bilateral   . H/O MET Test w/PFT  04/02/2012   low risk; peak VO2 77% predicted  . IR GENERIC HISTORICAL  09/12/2016   IR FLUORO GUIDE PORT INSERTION RIGHT 09/12/2016 [name], MD [facility]  . IR GENERIC HISTORICAL  09/12/2016   IR US GUIDE VASC ACCESS RIGHT 09/12/2016 [name], MD [facility]  . JOINT REPLACEMENT  2003   thumb rt  . KNEE ARTHROSCOPY  12   rt meniscus  . MEDIASTINOSCOPY  N/A 10/30/2013   Procedure: MEDIASTINOSCOPY;  Surgeon: Melrose Nakayama, MD;  Location: [facility];  Service: Thoracic;  Laterality: N/A;  . ROTATOR CUFF REPAIR  2006   ? side  . THYROIDECTOMY  1973  . TRANSTHORACIC ECHOCARDIOGRAM  09/25/2012   EF 55-60%; mild LVH & mild concentric hypertrophy; mild AV regurg; RV systolic pressure increase consistent with mild pulm HTN  . VIDEO ASSISTED THORACOSCOPY (VATS)/ LOBECTOMY Right 11/13/2013   Procedure: VIDEO ASSISTED THORACOSCOPY (VATS) with mediastinal  biopsies;  Surgeon: Melrose Nakayama, MD;  Location: [facility];  Service: Thoracic;  Laterality: Right;  RIGHT VATS,mediastinal biopsies  . VIDEO BRONCHOSCOPY Bilateral 09/25/2013   Procedure: VIDEO BRONCHOSCOPY WITHOUT FLUORO;  Surgeon: Tanda Rockers, MD;  Location: [facility];  Service: Cardiopulmonary;  Laterality: Bilateral;  . VIDEO BRONCHOSCOPY WITH ENDOBRONCHIAL ULTRASOUND N/A 10/30/2013   Procedure: VIDEO BRONCHOSCOPY WITH  ENDOBRONCHIAL ULTRASOUND;  Surgeon: Melrose Nakayama, MD;  Location: [facility];  Service: Thoracic;  Laterality: N/A;    REVIEW OF SYSTEMS:  A comprehensive review of systems was negative except for: Respiratory: positive for dyspnea on exertion Musculoskeletal: positive for back pain   PHYSICAL EXAMINATION: General appearance: alert, cooperative and no distress Head: Normocephalic, without obvious abnormality, atraumatic Neck: no adenopathy, no JVD, supple, symmetrical, trachea midline and thyroid not enlarged, symmetric, no tenderness/mass/nodules Lymph nodes: Cervical, supraclavicular, and axillary nodes normal. Resp: clear to auscultation bilaterally Back: symmetric, no curvature. ROM normal. No CVA tenderness. Cardio: regular rate and rhythm, S1, S2 normal, no murmur, click, rub or gallop GI: soft, non-tender; bowel sounds normal; no masses,  no organomegaly Extremities: extremities normal, atraumatic, no cyanosis or edema  ECOG PERFORMANCE STATUS: 1 - Symptomatic  but completely ambulatory  Blood pressure (!) 162/72, pulse 85, temperature 98.4 F (36.9 C), temperature source Oral, resp. rate 18, height 4' 10.5" (1.486 m), weight 162 lb 11.2 oz (73.8 kg), SpO2 100 %.  LABORATORY DATA: Lab Results  Component Value Date   WBC 7.1 07/12/2017   HGB 11.3 (L) 07/12/2017   HCT 33.1 (L) 07/12/2017   MCV 101.9 (H) 07/12/2017   PLT 251 07/12/2017      Chemistry      Component Value Date/Time   NA 140 07/12/2017 1212   K 3.5 07/12/2017 1212   CL 105 02/09/2017 0353   CO2 27 07/12/2017 1212   BUN 27.5 (H) 07/12/2017 1212   CREATININE 1.0 07/12/2017 1212      Component Value Date/Time   CALCIUM 10.0 07/12/2017 1212   ALKPHOS 119 07/12/2017 1212   AST 16 07/12/2017 1212   ALT 15 07/12/2017 1212   BILITOT 0.48 07/12/2017 1212       RADIOGRAPHIC STUDIES: Ct Chest W Contrast  Result Date: 07/13/2017 CLINICAL DATA:  Unresectable stage IIIA right middle lobe squamous cell lung carcinoma diagnosed February 2015 status post concurrent chemo radiation therapy, with local recurrence identified in November 2017, status post chemotherapy. Patient presents for restaging on interval observation. EXAM: CT CHEST WITH CONTRAST TECHNIQUE: Multidetector CT imaging of the chest was performed during intravenous contrast administration. CONTRAST:  62m ISOVUE-300 IOPAMIDOL (ISOVUE-300) INJECTION 61% COMPARISON:  04/09/2017 chest CT. FINDINGS: Cardiovascular: Normal heart size. No significant pericardial fluid/thickening. Three-vessel coronary atherosclerosis. Atherosclerotic nonaneurysmal thoracic aorta. Right internal jugular MediPort terminates in the lower third of the superior vena cava. Normal caliber pulmonary arteries. No central pulmonary emboli. Mediastinum/Nodes: Stable postsurgical changes from thyroidectomy. Unremarkable esophagus. No axillary adenopathy. Right subcarinal soft tissue encasing the bronchus intermedius measures up to the 1.0 cm in thickness  (series 2/ image 47), previously 1.0 cm, not appreciably changed. Otherwise no pathologically enlarged mediastinal or hilar nodes. Lungs/Pleura: No pneumothorax. Stable smooth circumferential right pleural thickening. No pleural effusions. No acute consolidative airspace disease or lung masses. Stable sharply marginated consolidation with associated volume loss, distortion and bronchiectasis in the parahilar right mid lung. Stable 0.9 cm lingular ground-glass pulmonary nodule (series 5/ image 77). Stable subcentimeter calcified left lower lobe granulomas. A few stable scattered tiny 2-3 mm subpleural pulmonary nodules in both lungs. No new significant pulmonary nodules. Upper abdomen: Small hiatal hernia. Stable partially visualized indeterminate partially exophytic 1.0 cm lateral interpolar right renal mass with density 68 HU (series 2/ image 134). Musculoskeletal: No aggressive appearing focal osseous lesions. Moderate thoracic spondylosis. IMPRESSION: 1. Stable mild soft tissue encasing the bronchus intermedius. 2. No new or progressive metastatic disease in  the chest. 3. 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 4. Stable 1.0 cm lateral interpolar right renal mass, characterized as renal cell carcinoma on 04/19/2017 MRI abdomen study. 5. Chronic findings include: Three-vessel coronary atherosclerosis. Small hiatal hernia. Aortic Atherosclerosis (ICD10-I70.0). Electronically Signed   By: Ilona Sorrel M.D.   On: 07/13/2017 09:16    ASSESSMENT AND PLAN: This is a very pleasant 76 years old white female with recurrent non-small cell lung cancer, squamous cell carcinoma. The patient completed 6 cycles of systemic chemotherapy with carboplatin and paclitaxel and tolerated her treatment well except for fatigue as well as peripheral neuropathy. She has no complaints today except for the chronic low back pain. She had repeat CT scan of the chest that showed no evidence for disease progression. I  discussed the scan results with the patient and recommended for her to continue on observation with a repeat CT scan of the chest in 3 months. For the right renal mass, she is followed by urology.  She is currently on observation. For hypertension, the patient was advised to continue monitor her blood pressure closely and to call her primary care physician if no improvement. She was advised to call immediately if she has any concerning symptoms in the interval. The patient voices understanding of current disease status and treatment options and is in agreement with the current care plan. All questions were answered. The patient knows to call the clinic with any problems, questions or concerns. We can certainly see the patient much sooner if necessary.   Disclaimer: This note was dictated with voice recognition software. Similar sounding words can inadvertently be transcribed and may not be corrected upon review.

## 2017-07-19 NOTE — Addendum Note (Signed)
Addended by: Ardeen Garland on: 07/19/2017 11:57 AM   Modules accepted: Orders

## 2017-07-19 NOTE — Telephone Encounter (Signed)
Gave patient avs and calendar with appt per 11/15 los

## 2017-07-23 DIAGNOSIS — M5136 Other intervertebral disc degeneration, lumbar region: Secondary | ICD-10-CM | POA: Diagnosis not present

## 2017-07-23 DIAGNOSIS — M4316 Spondylolisthesis, lumbar region: Secondary | ICD-10-CM | POA: Diagnosis not present

## 2017-07-28 DIAGNOSIS — H5789 Other specified disorders of eye and adnexa: Secondary | ICD-10-CM | POA: Diagnosis not present

## 2017-07-28 DIAGNOSIS — T7840XA Allergy, unspecified, initial encounter: Secondary | ICD-10-CM | POA: Diagnosis not present

## 2017-08-02 DIAGNOSIS — E039 Hypothyroidism, unspecified: Secondary | ICD-10-CM | POA: Diagnosis not present

## 2017-08-02 DIAGNOSIS — I10 Essential (primary) hypertension: Secondary | ICD-10-CM | POA: Diagnosis not present

## 2017-08-11 DIAGNOSIS — M47817 Spondylosis without myelopathy or radiculopathy, lumbosacral region: Secondary | ICD-10-CM | POA: Diagnosis not present

## 2017-08-24 DIAGNOSIS — R05 Cough: Secondary | ICD-10-CM | POA: Diagnosis not present

## 2017-08-24 DIAGNOSIS — R5383 Other fatigue: Secondary | ICD-10-CM | POA: Diagnosis not present

## 2017-09-10 DIAGNOSIS — N2889 Other specified disorders of kidney and ureter: Secondary | ICD-10-CM | POA: Diagnosis not present

## 2017-09-10 DIAGNOSIS — N289 Disorder of kidney and ureter, unspecified: Secondary | ICD-10-CM | POA: Diagnosis not present

## 2017-09-13 ENCOUNTER — Inpatient Hospital Stay: Payer: Medicare Other | Attending: Internal Medicine

## 2017-09-13 VITALS — BP 150/76 | HR 90 | Temp 98.5°F | Resp 20

## 2017-09-13 DIAGNOSIS — Z452 Encounter for adjustment and management of vascular access device: Secondary | ICD-10-CM | POA: Diagnosis not present

## 2017-09-13 DIAGNOSIS — I10 Essential (primary) hypertension: Secondary | ICD-10-CM | POA: Diagnosis not present

## 2017-09-13 DIAGNOSIS — Z95828 Presence of other vascular implants and grafts: Secondary | ICD-10-CM

## 2017-09-13 DIAGNOSIS — C342 Malignant neoplasm of middle lobe, bronchus or lung: Secondary | ICD-10-CM | POA: Insufficient documentation

## 2017-09-13 MED ORDER — SODIUM CHLORIDE 0.9% FLUSH
10.0000 mL | INTRAVENOUS | Status: DC | PRN
  Administered 2017-09-13: 10 mL via INTRAVENOUS
  Filled 2017-09-13: qty 10

## 2017-09-13 MED ORDER — HEPARIN SOD (PORK) LOCK FLUSH 100 UNIT/ML IV SOLN
500.0000 [IU] | Freq: Once | INTRAVENOUS | Status: AC
  Administered 2017-09-13: 500 [IU] via INTRAVENOUS
  Filled 2017-09-13: qty 5

## 2017-09-13 NOTE — Patient Instructions (Signed)
Implanted Port Home Guide An implanted port is a type of central line that is placed under the skin. Central lines are used to provide IV access when treatment or nutrition needs to be given through a person's veins. Implanted ports are used for long-term IV access. An implanted port may be placed because:  You need IV medicine that would be irritating to the small veins in your hands or arms.  You need long-term IV medicines, such as antibiotics.  You need IV nutrition for a long period.  You need frequent blood draws for lab tests.  You need dialysis.  Implanted ports are usually placed in the chest area, but they can also be placed in the upper arm, the abdomen, or the leg. An implanted port has two main parts:  Reservoir. The reservoir is round and will appear as a small, raised area under your skin. The reservoir is the part where a needle is inserted to give medicines or draw blood.  Catheter. The catheter is a thin, flexible tube that extends from the reservoir. The catheter is placed into a large vein. Medicine that is inserted into the reservoir goes into the catheter and then into the vein.  How will I care for my incision site? Do not get the incision site wet. Bathe or shower as directed by your health care provider. How is my port accessed? Special steps must be taken to access the port:  Before the port is accessed, a numbing cream can be placed on the skin. This helps numb the skin over the port site.  Your health care provider uses a sterile technique to access the port. ? Your health care provider must put on a mask and sterile gloves. ? The skin over your port is cleaned carefully with an antiseptic and allowed to dry. ? The port is gently pinched between sterile gloves, and a needle is inserted into the port.  Only "non-coring" port needles should be used to access the port. Once the port is accessed, a blood return should be checked. This helps ensure that the port  is in the vein and is not clogged.  If your port needs to remain accessed for a constant infusion, a clear (transparent) bandage will be placed over the needle site. The bandage and needle will need to be changed every week, or as directed by your health care provider.  Keep the bandage covering the needle clean and dry. Do not get it wet. Follow your health care provider's instructions on how to take a shower or bath while the port is accessed.  If your port does not need to stay accessed, no bandage is needed over the port.  What is flushing? Flushing helps keep the port from getting clogged. Follow your health care provider's instructions on how and when to flush the port. Ports are usually flushed with saline solution or a medicine called heparin. The need for flushing will depend on how the port is used.  If the port is used for intermittent medicines or blood draws, the port will need to be flushed: ? After medicines have been given. ? After blood has been drawn. ? As part of routine maintenance.  If a constant infusion is running, the port may not need to be flushed.  How long will my port stay implanted? The port can stay in for as long as your health care provider thinks it is needed. When it is time for the port to come out, surgery will be   done to remove it. The procedure is similar to the one performed when the port was put in. When should I seek immediate medical care? When you have an implanted port, you should seek immediate medical care if:  You notice a bad smell coming from the incision site.  You have swelling, redness, or drainage at the incision site.  You have more swelling or pain at the port site or the surrounding area.  You have a fever that is not controlled with medicine.  This information is not intended to replace advice given to you by your health care provider. Make sure you discuss any questions you have with your health care provider. Document  Released: 08/21/2005 Document Revised: 01/27/2016 Document Reviewed: 04/28/2013 Elsevier Interactive Patient Education  2017 [employer]  

## 2017-10-15 ENCOUNTER — Inpatient Hospital Stay: Payer: Medicare Other | Attending: Internal Medicine

## 2017-10-15 ENCOUNTER — Ambulatory Visit (HOSPITAL_COMMUNITY)
Admission: RE | Admit: 2017-10-15 | Discharge: 2017-10-15 | Disposition: A | Discharge status: Home / Self Care | Payer: Medicare Other | Source: Ambulatory Visit | Attending: Internal Medicine | Admitting: Internal Medicine

## 2017-10-15 ENCOUNTER — Inpatient Hospital Stay: Payer: Medicare Other

## 2017-10-15 DIAGNOSIS — C342 Malignant neoplasm of middle lobe, bronchus or lung: Secondary | ICD-10-CM

## 2017-10-15 DIAGNOSIS — R05 Cough: Secondary | ICD-10-CM | POA: Diagnosis not present

## 2017-10-15 DIAGNOSIS — N289 Disorder of kidney and ureter, unspecified: Secondary | ICD-10-CM | POA: Insufficient documentation

## 2017-10-15 DIAGNOSIS — E860 Dehydration: Secondary | ICD-10-CM

## 2017-10-15 DIAGNOSIS — J701 Chronic and other pulmonary manifestations due to radiation: Secondary | ICD-10-CM | POA: Diagnosis not present

## 2017-10-15 DIAGNOSIS — I10 Essential (primary) hypertension: Secondary | ICD-10-CM | POA: Insufficient documentation

## 2017-10-15 DIAGNOSIS — R918 Other nonspecific abnormal finding of lung field: Secondary | ICD-10-CM | POA: Insufficient documentation

## 2017-10-15 DIAGNOSIS — R0609 Other forms of dyspnea: Secondary | ICD-10-CM | POA: Insufficient documentation

## 2017-10-15 DIAGNOSIS — N2889 Other specified disorders of kidney and ureter: Secondary | ICD-10-CM | POA: Diagnosis not present

## 2017-10-15 DIAGNOSIS — I700 Atherosclerosis of aorta: Secondary | ICD-10-CM | POA: Insufficient documentation

## 2017-10-15 LAB — CBC WITH DIFFERENTIAL/PLATELET
BASOS PCT: 1 %
Basophils Absolute: 0.1 10*3/uL (ref 0.0–0.1)
Eosinophils Absolute: 0.3 10*3/uL (ref 0.0–0.5)
Eosinophils Relative: 4 %
HEMATOCRIT: 32.1 % — AB (ref 34.8–46.6)
HEMOGLOBIN: 10.9 g/dL — AB (ref 11.6–15.9)
LYMPHS ABS: 1.2 10*3/uL (ref 0.9–3.3)
LYMPHS PCT: 17 %
MCH: 33.4 pg (ref 25.1–34.0)
MCHC: 33.9 g/dL (ref 31.5–36.0)
MCV: 98.6 fL (ref 79.5–101.0)
MONO ABS: 0.5 10*3/uL (ref 0.1–0.9)
MONOS PCT: 7 %
NEUTROS ABS: 5.2 10*3/uL (ref 1.5–6.5)
NEUTROS PCT: 71 %
Platelets: 278 10*3/uL (ref 145–400)
RBC: 3.25 MIL/uL — ABNORMAL LOW (ref 3.70–5.45)
RDW: 12.8 % (ref 11.2–14.5)
WBC: 7.4 10*3/uL (ref 3.9–10.3)

## 2017-10-15 LAB — COMPREHENSIVE METABOLIC PANEL
ALT: 15 U/L (ref 0–55)
ANION GAP: 10 (ref 3–11)
AST: 19 U/L (ref 5–34)
Albumin: 3.6 g/dL (ref 3.5–5.0)
Alkaline Phosphatase: 122 U/L (ref 40–150)
BILIRUBIN TOTAL: 0.5 mg/dL (ref 0.2–1.2)
BUN: 21 mg/dL (ref 7–26)
CO2: 26 mmol/L (ref 22–29)
Calcium: 9.9 mg/dL (ref 8.4–10.4)
Chloride: 105 mmol/L (ref 98–109)
Creatinine, Ser: 1.02 mg/dL (ref 0.60–1.10)
GFR calc Af Amer: >60 mL/min (ref >60)
GFR, EST NON AFRICAN AMERICAN: 53 mL/min — AB (ref >60)
GLUCOSE: 79 mg/dL (ref 70–140)
POTASSIUM: 4.3 mmol/L (ref 3.5–5.1)
Sodium: 141 mmol/L (ref 136–145)
TOTAL PROTEIN: 6.7 g/dL (ref 6.4–8.3)

## 2017-10-15 MED ORDER — HEPARIN SOD (PORK) LOCK FLUSH 100 UNIT/ML IV SOLN
500.0000 [IU] | Freq: Once | INTRAVENOUS | Status: AC
  Administered 2017-10-15: 500 [IU] via INTRAVENOUS

## 2017-10-15 MED ORDER — IOPAMIDOL (ISOVUE-300) INJECTION 61%
75.0000 mL | Freq: Once | INTRAVENOUS | Status: AC | PRN
  Administered 2017-10-15: 75 mL via INTRAVENOUS

## 2017-10-15 MED ORDER — IOPAMIDOL (ISOVUE-300) INJECTION 61%
INTRAVENOUS | Status: AC
  Filled 2017-10-15: qty 75

## 2017-10-15 MED ORDER — SODIUM CHLORIDE 0.9% FLUSH
10.0000 mL | INTRAVENOUS | Status: DC | PRN
  Administered 2017-10-15: 10 mL
  Filled 2017-10-15: qty 10

## 2017-10-15 MED ORDER — SODIUM CHLORIDE 0.9 % IJ SOLN
INTRAMUSCULAR | Status: AC
  Filled 2017-10-15: qty 50

## 2017-10-15 MED ORDER — HEPARIN SOD (PORK) LOCK FLUSH 100 UNIT/ML IV SOLN
INTRAVENOUS | Status: AC
  Filled 2017-10-15: qty 5

## 2017-10-18 ENCOUNTER — Encounter: Payer: Self-pay | Admitting: Internal Medicine

## 2017-10-18 ENCOUNTER — Telehealth: Payer: Self-pay

## 2017-10-18 ENCOUNTER — Inpatient Hospital Stay (HOSPITAL_BASED_OUTPATIENT_CLINIC_OR_DEPARTMENT_OTHER): Payer: Medicare Other | Admitting: Internal Medicine

## 2017-10-18 DIAGNOSIS — R0609 Other forms of dyspnea: Secondary | ICD-10-CM

## 2017-10-18 DIAGNOSIS — R05 Cough: Secondary | ICD-10-CM | POA: Diagnosis not present

## 2017-10-18 DIAGNOSIS — N289 Disorder of kidney and ureter, unspecified: Secondary | ICD-10-CM

## 2017-10-18 DIAGNOSIS — C342 Malignant neoplasm of middle lobe, bronchus or lung: Secondary | ICD-10-CM | POA: Diagnosis not present

## 2017-10-18 DIAGNOSIS — I10 Essential (primary) hypertension: Secondary | ICD-10-CM | POA: Diagnosis not present

## 2017-10-18 DIAGNOSIS — C3490 Malignant neoplasm of unspecified part of unspecified bronchus or lung: Secondary | ICD-10-CM

## 2017-10-18 NOTE — Progress Notes (Signed)
[place] [phone]   Fax:([phone]) [place], [place] [address] [place] [address]  DIAGNOSIS: Recurrent non-small cell lung cancer, squamous cell carcinoma initially diagnosed as unresectable a stage IIIa in February 2015.  PRIOR THERAPY: 1) Status post exploratory right VATS with mediastinal biopsy under the care of Dr. Roxan Hockey on 11/13/2013. The tumor was found to be unresectable at that time.[name] 2) Concurrent chemoradiation with weekly carboplatin for AUC of 2 and paclitaxel 45 mg/M2, status post 6 cycles with partial response. First cycle was given on 12/01/2013. 3) Consolidation chemotherapy with carboplatin for AUC of 5 and paclitaxel 175 mg/M2 every 3 weeks with Neulasta support. First dose 02/23/2014. Status post 3 cycles with partial response. 4) Systemic chemotherapy with carboplatin for AUC of 5 and paclitaxel 175 MG/M2 every 3 weeks with Neulasta support. Status post 6 cycles. First dose was given on 08/14/2016.  CURRENT THERAPY: Observation.  INTERVAL HISTORY: Kimberly [name] 77 y.o. female returns to the clinic today for follow-up visit.  The patient is feeling fine today with no specific complaints except for the persistent cough that is sometimes productive of whitish sputum.  She denied having any significant chest pain but has shortness of breath with exertion with no hemoptysis.  She has no recent weight loss or night sweats.  She has no nausea, vomiting, diarrhea or constipation.  She has been in observations for almost a year and the patient is here today for evaluation with repeat CT scan of the chest.  MEDICAL HISTORY: Past Medical History:  Diagnosis Date  . Allergic asthma without acute exacerbation or status asthmaticus   . Aortic insufficiency   . Arthritis   . Atrial fibrillation ([place])   . Back pain 08/14/2016  . Bronchitis   . Cough    dry, endobronchial mass  .  Dyslipidemia   . Family history of adverse reaction to anesthesia    pts son also experiences N/V  . GERD (gastroesophageal reflux disease)   . Heart murmur   . History of blood transfusion   . History of bronchitis   . History of chemotherapy   . History of nuclear stress test 02/26/2006   exercise myoview; normal pattern of perfusion; low risk scan   . Hypertension   . Hypothyroidism   . Mitral insufficiency   . Pneumonia   . PONV (postoperative nausea and vomiting)   . Radiation 12/01/13-01/08/14   50.4 gray to right central chest  . Renal mass, left 04/12/2017  . Shortness of breath    with exertion  . Squamous cell carcinoma of lung ([place])   . Syncope    "states she has passed out a few times. dr is trying to find cause.last time when geeting ready to go home after video bronch/bx  . Thyroid disease     ALLERGIES:  is allergic to lactose intolerance (gi); amiodarone; augmentin [amoxicillin-pot clavulanate]; and milk-related compounds.  MEDICATIONS:  Current Outpatient Medications  Medication Sig Dispense Refill  . albuterol (PROVENTIL HFA;VENTOLIN HFA) 108 (90 BASE) MCG/ACT inhaler Inhale 1 puff into the lungs every 6 (six) hours as needed for wheezing or shortness of breath.     [name] atorvastatin (LIPITOR) 20 MG tablet Take 20 mg by mouth every morning.     . benzonatate (TESSALON) 100 MG capsule Take 1 capsule (100 mg total) by mouth 3 (three) times daily as needed for cough. 20 capsule 0  .  chlorthalidone (HYGROTON) 25 MG tablet TAKE 1 TABLET BY MOUTH  DAILY 90 tablet 1  . dextromethorphan-guaiFENesin (MUCINEX DM) 30-600 MG 12hr tablet Take 1 tablet by mouth 2 (two) times daily. 10 tablet 0  . feeding supplement, ENSURE ENLIVE, (ENSURE ENLIVE) LIQD Take 237 mLs by mouth 2 (two) times daily between meals. 237 mL 12  . ferrous sulfate 325 (65 FE) MG tablet Take 1 tablet (325 mg total) by mouth 2 (two) times daily with a meal. 60 tablet 1  . KLOR-CON 8 MEQ tablet Take 8 mEq every  other day by mouth.  3  . levothyroxine (SYNTHROID, LEVOTHROID) 100 MCG tablet Take 100 mcg daily by mouth.    . lidocaine-prilocaine (EMLA) cream Apply 1 application topically as needed. (Patient taking differently: Apply 1 application topically as needed (for port access). ) 30 g 0  . loperamide (IMODIUM) 2 MG capsule Take 2 mg by mouth as needed for diarrhea or loose stools.    [name] losartan (COZAAR) 50 MG tablet     . metoprolol succinate (TOPROL-XL) 25 MG 24 hr tablet TAKE ONE-HALF TABLET BY  MOUTH DAILY 45 tablet 1  . montelukast (SINGULAIR) 10 MG tablet Take 10 mg by mouth at bedtime.     . Multiple Vitamins-Iron (MULTIVITAMIN/IRON PO) Take 1 tablet by mouth daily with breakfast.     . pantoprazole (PROTONIX) 40 MG tablet Take 1 tablet (40 mg total) by mouth 2 (two) times daily before a meal. 60 tablet 0  . predniSONE (DELTASONE) 20 MG tablet TAKE 1 TABLET (20 MG TOTAL) BY MOUTH DAILY. FOR 3 DAYS  0  . traMADol (ULTRAM) 50 MG tablet Take 50 mg every 8 (eight) hours as needed by mouth. Take 1/2 tablet  3   No current facility-administered medications for this visit.     SURGICAL HISTORY:  Past Surgical History:  Procedure Laterality Date  . CARDIAC CATHETERIZATION  07/08/2008   normal coronaries  . CARPAL TUNNEL RELEASE Right 1988  . COLONOSCOPY N/A 02/11/2016   Procedure: COLONOSCOPY;  Surgeon: Carol Ada, MD;  Location: [facility];  Service: Endoscopy;  Laterality: N/A;  . DILATION AND CURETTAGE OF UTERUS    . ESOPHAGOGASTRODUODENOSCOPY N/A 02/08/2017   Procedure: ESOPHAGOGASTRODUODENOSCOPY (EGD);  Surgeon: Carol Ada, MD;  Location: [facility];  Service: Endoscopy;  Laterality: N/A;  . EYE SURGERY Bilateral   . H/O MET Test w/PFT  04/02/2012   low risk; peak VO2 77% predicted  . IR GENERIC HISTORICAL  09/12/2016   IR FLUORO GUIDE PORT INSERTION RIGHT 09/12/2016 [name], MD [facility]  . IR GENERIC HISTORICAL  09/12/2016   IR US GUIDE VASC ACCESS RIGHT 09/12/2016 [name], MD [facility]  . JOINT REPLACEMENT  2003   thumb rt  . KNEE ARTHROSCOPY  12   rt meniscus  . MEDIASTINOSCOPY N/A 10/30/2013   Procedure: MEDIASTINOSCOPY;  Surgeon: Melrose Nakayama, MD;  Location: [facility];  Service: Thoracic;  Laterality: N/A;  . ROTATOR CUFF REPAIR  2006   ? side  . THYROIDECTOMY  1973  . TRANSTHORACIC ECHOCARDIOGRAM  09/25/2012   EF 55-60%; mild LVH & mild concentric hypertrophy; mild AV regurg; RV systolic pressure increase consistent with mild pulm HTN  . VIDEO ASSISTED THORACOSCOPY (VATS)/ LOBECTOMY Right 11/13/2013   Procedure: VIDEO ASSISTED THORACOSCOPY (VATS) with mediastinal  biopsies;  Surgeon: Melrose Nakayama, MD;  Location: [facility];  Service: Thoracic;  Laterality: Right;  RIGHT VATS,mediastinal biopsies  . VIDEO BRONCHOSCOPY Bilateral 09/25/2013  Procedure: VIDEO BRONCHOSCOPY WITHOUT FLUORO;  Surgeon: Tanda Rockers, MD;  Location: [facility];  Service: Cardiopulmonary;  Laterality: Bilateral;  . VIDEO BRONCHOSCOPY WITH ENDOBRONCHIAL ULTRASOUND N/A 10/30/2013   Procedure: VIDEO BRONCHOSCOPY WITH ENDOBRONCHIAL ULTRASOUND;  Surgeon: Melrose Nakayama, MD;  Location: [facility];  Service: Thoracic;  Laterality: N/A;    REVIEW OF SYSTEMS:  A comprehensive review of systems was negative except for: Respiratory: positive for cough and dyspnea on exertion   PHYSICAL EXAMINATION: General appearance: alert, cooperative and no distress Head: Normocephalic, without obvious abnormality, atraumatic Neck: no adenopathy, no JVD, supple, symmetrical, trachea midline and thyroid not enlarged, symmetric, no tenderness/mass/nodules Lymph nodes: Cervical, supraclavicular, and axillary nodes normal. Resp: clear to auscultation bilaterally Back: symmetric, no curvature. ROM normal. No CVA tenderness. Cardio: regular rate and rhythm, S1, S2 normal, no murmur, click, rub or gallop GI: soft, non-tender; bowel sounds normal; no masses,  no organomegaly Extremities:  extremities normal, atraumatic, no cyanosis or edema  ECOG PERFORMANCE STATUS: 1 - Symptomatic but completely ambulatory  Blood pressure (!) 144/63, pulse 78, temperature 98.8 F (37.1 C), temperature source Oral, resp. rate 20, height 4' 10.5" (1.486 m), weight 163 lb 12.8 oz (74.3 kg), SpO2 100 %.  LABORATORY DATA: Lab Results  Component Value Date   WBC 7.4 10/15/2017   HGB 10.9 (L) 10/15/2017   HCT 32.1 (L) 10/15/2017   MCV 98.6 10/15/2017   PLT 278 10/15/2017      Chemistry      Component Value Date/Time   NA 141 10/15/2017 1116   NA 140 07/12/2017 1212   K 4.3 10/15/2017 1116   K 3.5 07/12/2017 1212   CL 105 10/15/2017 1116   CO2 26 10/15/2017 1116   CO2 27 07/12/2017 1212   BUN 21 10/15/2017 1116   BUN 27.5 (H) 07/12/2017 1212   CREATININE 1.02 10/15/2017 1116   CREATININE 1.0 07/12/2017 1212      Component Value Date/Time   CALCIUM 9.9 10/15/2017 1116   CALCIUM 10.0 07/12/2017 1212   ALKPHOS 122 10/15/2017 1116   ALKPHOS 119 07/12/2017 1212   AST 19 10/15/2017 1116   AST 16 07/12/2017 1212   ALT 15 10/15/2017 1116   ALT 15 07/12/2017 1212   BILITOT 0.5 10/15/2017 1116   BILITOT 0.48 07/12/2017 1212       RADIOGRAPHIC STUDIES: Ct Chest W Contrast  Result Date: 10/15/2017 CLINICAL DATA:  Patient with history of right middle lobe squamous cell carcinoma. Follow-up evaluation. EXAM: CT CHEST WITH CONTRAST TECHNIQUE: Multidetector CT imaging of the chest was performed during intravenous contrast administration. CONTRAST:  75 cc Isovue 300 COMPARISON:  CT chest 11 18 FINDINGS: Cardiovascular: Right anterior chest wall Port-A-Cath is present with tip terminating in the superior vena cava. Normal heart size. Trace pericardial fluid. Thoracic aortic vascular calcifications. Mediastinum/Nodes: Grossly unchanged soft tissue encasing the bronchus intermedius measuring up to 10 mm (image 50; series 2). No additional enlarged mediastinal or hilar lymph nodes. Status post  thyroidectomy. Small hiatal hernia. Lungs/Pleura: Central airways are patent. Within the mid right hemithorax within the focal area of consolidation there is suggestion of a possible 3.2 x 2.7 cm mixed attenuation mass (image 60; series 2). Otherwise stable right paramediastinal post radiation change. Slight interval decrease in size lingular ground-glass nodule measuring 4 mm (image 71; series 7), previously 9 mm. Stable calcified left lower lobe granulomas. Stable smooth pleural thickening right hemithorax. No pneumothorax. Interval development of patchy ground-glass nodularity within the aerated right upper lobe (  image 45; series 7). Upper Abdomen: Unchanged 1.0 cm mixed density partially exophytic right renal mass (image 137; series 2), incompletely visualized. Musculoskeletal: Thoracic spine degenerative changes. No aggressive or acute appearing osseous lesions. IMPRESSION: 1. Possible mixed attenuation mass located within the right mid lung consolidation raising the possibility of recurrence. Consider further evaluation with PET-CT. 2. Interval development of patchy ground-glass opacities within the aerated right upper lobe which may be infectious/inflammatory in etiology. Recommend attention on follow-up. 3. Otherwise stable appearance of the chest including soft tissue encasing the bronchus intermedius and right perihilar radiation fibrosis. 4. Re demonstrated incompletely visualized enhancing right renal mass. 5. Aortic Atherosclerosis (ICD10-I70.0). Electronically Signed   By: Lovey Newcomer M.D.   On: 10/15/2017 14:33    ASSESSMENT AND PLAN: This is a very pleasant 77 years old white female with recurrent non-small cell lung cancer, squamous cell carcinoma. The patient completed 6 cycles of systemic chemotherapy with carboplatin and paclitaxel and tolerated her treatment well except for fatigue as well as peripheral neuropathy. The patient is feeling fine today with no specific complaints except for  mild cough.  Repeat CT scan of the chest showed questionable mass located within the right mid lung consolidation suspicious for disease recurrence.  I personally and independently reviewed the scan images and discussed the results and showed the images to the patient today.  I did not see a significant difference from her previous scan 3 months ago. I discussed with the patient several options for her condition including repeating a PET scan versus continuous monitoring and observation with repeat CT scan of the chest in few months.  The patient would like to continue on observation for now.  I will see her back for follow-up visit in 3 months with repeat CT scan of the chest but she was advised to call immediately if she has any concerning symptoms in the interval.. For the right renal mass, she is followed by urology.  She is currently on observation. For hypertension, the patient was advised to continue monitor her blood pressure closely and to call her primary care physician if no improvement. The patient voices understanding of current disease status and treatment options and is in agreement with the current care plan. All questions were answered. The patient knows to call the clinic with any problems, questions or concerns. We can certainly see the patient much sooner if necessary.   Disclaimer: This note was dictated with voice recognition software. Similar sounding words can inadvertently be transcribed and may not be corrected upon review.

## 2017-10-18 NOTE — Telephone Encounter (Signed)
PRINTED AVS AND CALENDER FOR UP COMING APPOINTMENTS.PER 20/14 LOS

## 2017-10-29 DIAGNOSIS — H524 Presbyopia: Secondary | ICD-10-CM | POA: Diagnosis not present

## 2017-11-02 DIAGNOSIS — R509 Fever, unspecified: Secondary | ICD-10-CM | POA: Diagnosis not present

## 2017-11-17 ENCOUNTER — Other Ambulatory Visit: Payer: Self-pay | Admitting: Internal Medicine

## 2017-11-19 NOTE — Telephone Encounter (Signed)
Rx has been sent to the pharmacy electronically. ° °

## 2017-11-29 ENCOUNTER — Inpatient Hospital Stay: Payer: Medicare Other | Attending: Internal Medicine

## 2017-11-29 DIAGNOSIS — Z452 Encounter for adjustment and management of vascular access device: Secondary | ICD-10-CM | POA: Insufficient documentation

## 2017-11-29 DIAGNOSIS — C342 Malignant neoplasm of middle lobe, bronchus or lung: Secondary | ICD-10-CM | POA: Insufficient documentation

## 2017-11-29 DIAGNOSIS — E860 Dehydration: Secondary | ICD-10-CM

## 2017-11-29 MED ORDER — HEPARIN SOD (PORK) LOCK FLUSH 100 UNIT/ML IV SOLN
500.0000 [IU] | Freq: Once | INTRAVENOUS | Status: AC | PRN
  Administered 2017-11-29: 500 [IU]
  Filled 2017-11-29: qty 5

## 2017-11-29 MED ORDER — SODIUM CHLORIDE 0.9% FLUSH
10.0000 mL | INTRAVENOUS | Status: DC | PRN
  Administered 2017-11-29: 10 mL
  Filled 2017-11-29: qty 10

## 2018-01-02 ENCOUNTER — Ambulatory Visit: Payer: Medicare Other | Admitting: Internal Medicine

## 2018-01-02 ENCOUNTER — Encounter: Payer: Self-pay | Admitting: Internal Medicine

## 2018-01-02 VITALS — BP 144/68 | HR 75 | Ht 58.5 in | Wt 161.8 lb

## 2018-01-02 DIAGNOSIS — R0602 Shortness of breath: Secondary | ICD-10-CM | POA: Diagnosis not present

## 2018-01-02 DIAGNOSIS — R05 Cough: Secondary | ICD-10-CM

## 2018-01-02 DIAGNOSIS — C3490 Malignant neoplasm of unspecified part of unspecified bronchus or lung: Secondary | ICD-10-CM | POA: Diagnosis not present

## 2018-01-02 DIAGNOSIS — I10 Essential (primary) hypertension: Secondary | ICD-10-CM | POA: Diagnosis not present

## 2018-01-02 DIAGNOSIS — R059 Cough, unspecified: Secondary | ICD-10-CM | POA: Insufficient documentation

## 2018-01-02 MED ORDER — AMLODIPINE BESYLATE 5 MG PO TABS: 5.0000 mg | ORAL_TABLET | Freq: Every day | ORAL | 3 refills | Status: DC

## 2018-01-02 NOTE — Patient Instructions (Addendum)
Medication Instructions:   STOP losartan START amlodipine 5mg  once daily  Labwork:  NONE  Testing/Procedures:  NONE  Follow-Up:  Your physician wants you to follow-up in: 6 months with Dr. Debara Pickett. You will receive a reminder letter in the mail two months in advance. If you don't receive a letter, please call our office to schedule the follow-up appointment.  You have been referred to Dr. Simonne Maffucci (pulmonologist)   If you need a refill on your cardiac medications before your next appointment, please call your pharmacy.  Any Other Special Instructions Will Be Listed Below (If Applicable).

## 2018-01-02 NOTE — Progress Notes (Signed)
OFFICE NOTE  Chief Complaint:  Follow-up, shortness of breath is worse  Primary Care Physician: Deland Pretty, MD  HPI:  Kimberly [name] is a 77 year old female who recently has been having some chest discomfort and shortness of breath as well as 2 episodes of syncope, both of which were during stressful situations, once while on a ladder. We went ahead and checked an echocardiogram which essentially shows only mild abnormalities. She did have a loudly calcified aortic valve which was not stenotic; however, there was mild regurgitation. There was mild LVH with EF 55% to 60% and grade 1 diastolic dysfunction. She had borderline pulmonary hypertension. A monitor was also worn by her from September 13, 2012 to October 12, 2012. This showed a predominantly sinus rhythm with PACs. There was, however, 1 episode of a nonsustained ventricular tachycardia which was about 5 beats. Of course she potentially would have to had longer episodes of NSVT this could have been playing a role in her syncopal event. I do not think she, however, is at high risk for lethal sustained VT given her normal ejection fraction and no clear evidence of ischemia. As you recall, she had a low-risk MET-test on April 02, 2012, with a peak VO2 of 77% predicted without any evidence of an ischemic response. This would make it quite unlikely that she has underlying coronary blockage that is responsible for her episode. I have, however, recommended adding a beta blocker to her regimen which will additionally help control her recent uncontrolled hypertension. This is Toprol XL 12.5 mg daily. It should help also with the VT. she is continuing to take low-dose Toprol and has had no further episodes of passing out in the past 6 months. She was thoroughly evaluated by a neurologist and felt to possibly be having a vasodepressive syncope. There was lower extremity.swelling and compression stockings were recommended.  Kimberly [name] returns today for  followup. She has been hospitalized a number of times since getting a diagnosis of lung cancer. 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 Unfortunately she developed A. fib with RVR and was treated and placed on amiodarone. She's been having problems with recurrent vasovagal syncope and hypotension. She does have a mass that is pressing against the atrium.  I saw Kimberly [name] back in the office today. She is undergoing chemotherapy which is cause some mild shrinkage of her tumor however it is not disappeared, and apparently is incurable and unresectable. Fortunate she's had no recurrence of her A. fib and remains on low-dose amiodarone. She says that she feels better than she had when she was undergoing radiation chemotherapy. She has no new complaints.  Kimberly [name] returns today for follow-up. She seems to be holding her own with regards for lung cancer. She has another 4 month reprieve to see her oncologist. Her most recent scan showed a slight increase in the size of her cancer with another small tumor but she seems to be taking this pretty well. She's not had recurrent A. fib from what I can tell and maintains on low-dose amiodarone. She is due for repeat lipid testing as well as surveillance thyroid testing.   Kimberly [name] returns today for follow-up. She denies any chest pain although does get short of breath with some activities. She reports that her lung cancer is still growing at a slow rate however  She was intolerant of extensive chemotherapy and is now on a chemotherapy break. Recently I received notification from  her optometrist that she has some amiodarone deposits in her eyes and therefore he recommended discontinuing her medication if it was feasible. Since she's not had A. Fib in some time I thought it would be reasonable to take her off the medicine to see if there would be any recurrence. She's had some vision loss unrelated to amiodarone and we did not like  to see that adding to it.  05/03/2016  Kimberly [name] returns today for follow-up. She says that recently she's continued to be fatigued. She does not relate this to her recent lung cancer and chemotherapy. She stopped taking atorvastatin due to muscle aches and reports some improvement and no symptoms. She is concerned about cardiac causes of her symptoms including coronary artery disease. Blood pressure appears well controlled today. EKG shows a sinus rhythm with poor anterior R-wave progression and 82.  06/05/2016  Kimberly [name] was seen today in follow-up. She underwent nuclear stress testing which was negative for ischemia and showed normal LV function. I reassured her that I did not feel that her fatigue is related to worsening coronary artery disease.  01/01/2017  Kimberly [name] returns today for follow-up. Unfortunately she's had some recurrent cancer. She underwent another round of chemotherapy. After this chemotherapy she was noted to be more short of breath and fatigues much easier. She's also had some worsening dependent edema. She says the swelling typically goes down in the morning and is swollen by the end of the day when she's been up on her feet. She recently had a stress test which was negative for ischemia and showed normal LV function.  01/02/2018  Kimberly [name] was seen today in follow-up.  She continues to battle lung cancer.  She reports that she has been more short of breath recently.  The shortness of breath is associated with cough as well.  The cough is been somewhat significant.  She gets into fits of cough, particularly at night for which she feels like her heart might stop.  She wondered if it could be related to her medications.  She is on an ARB but not an ACE inhibitor.  There is a small likelihood that could be playing a role in her cough, however I suspect it may be lung parenchymal disease or related to her lung cancer.  She does not see a pulmonologist.  Labs as of October 2018 showed total cholesterol  162, HDL 49, LDL 78 and triglycerides 174.  PMHx:  Past Medical History:  Diagnosis Date  . Allergic asthma without acute exacerbation or status asthmaticus   . Aortic insufficiency   . Arthritis   . Atrial fibrillation ([place])   . Back pain 08/14/2016  . Bronchitis   . Cough    dry, endobronchial mass  . Dyslipidemia   . Family history of adverse reaction to anesthesia    pts son also experiences N/V  . GERD (gastroesophageal reflux disease)   . Heart murmur   . History of blood transfusion   . History of bronchitis   . History of chemotherapy   . History of nuclear stress test 02/26/2006   exercise myoview; normal pattern of perfusion; low risk scan   . Hypertension   . Hypothyroidism   . Mitral insufficiency   . Pneumonia   . PONV (postoperative nausea and vomiting)   . Radiation 12/01/13-01/08/14   50.4 gray to right central chest  . Renal mass, left 04/12/2017  . Shortness of breath    with exertion  . Squamous cell carcinoma of lung (  [place])   . Syncope    "states she has passed out a few times. dr is trying to find cause.last time when geeting ready to go home after video bronch/bx  . Thyroid disease     Past Surgical History:  Procedure Laterality Date  . CARDIAC CATHETERIZATION  07/08/2008   normal coronaries  . CARPAL TUNNEL RELEASE Right 1988  . COLONOSCOPY N/A 02/11/2016   Procedure: COLONOSCOPY;  Surgeon: Carol Ada, MD;  Location: [facility];  Service: Endoscopy;  Laterality: N/A;  . DILATION AND CURETTAGE OF UTERUS    . ESOPHAGOGASTRODUODENOSCOPY N/A 02/08/2017   Procedure: ESOPHAGOGASTRODUODENOSCOPY (EGD);  Surgeon: Carol Ada, MD;  Location: [facility];  Service: Endoscopy;  Laterality: N/A;  . EYE SURGERY Bilateral   . H/O MET Test w/PFT  04/02/2012   low risk; peak VO2 77% predicted  . IR GENERIC HISTORICAL  09/12/2016   IR FLUORO GUIDE PORT INSERTION RIGHT 09/12/2016 [name], MD [facility]  . IR GENERIC HISTORICAL  09/12/2016   IR US GUIDE VASC  ACCESS RIGHT 09/12/2016 [name], MD [facility]  . JOINT REPLACEMENT  2003   thumb rt  . KNEE ARTHROSCOPY  12   rt meniscus  . MEDIASTINOSCOPY N/A 10/30/2013   Procedure: MEDIASTINOSCOPY;  Surgeon: Melrose Nakayama, MD;  Location: [facility];  Service: Thoracic;  Laterality: N/A;  . ROTATOR CUFF REPAIR  2006   ? side  . THYROIDECTOMY  1973  . TRANSTHORACIC ECHOCARDIOGRAM  09/25/2012   EF 55-60%; mild LVH & mild concentric hypertrophy; mild AV regurg; RV systolic pressure increase consistent with mild pulm HTN  . VIDEO ASSISTED THORACOSCOPY (VATS)/ LOBECTOMY Right 11/13/2013   Procedure: VIDEO ASSISTED THORACOSCOPY (VATS) with mediastinal  biopsies;  Surgeon: Melrose Nakayama, MD;  Location: [facility];  Service: Thoracic;  Laterality: Right;  RIGHT VATS,mediastinal biopsies  . VIDEO BRONCHOSCOPY Bilateral 09/25/2013   Procedure: VIDEO BRONCHOSCOPY WITHOUT FLUORO;  Surgeon: Tanda Rockers, MD;  Location: [facility];  Service: Cardiopulmonary;  Laterality: Bilateral;  . VIDEO BRONCHOSCOPY WITH ENDOBRONCHIAL ULTRASOUND N/A 10/30/2013   Procedure: VIDEO BRONCHOSCOPY WITH ENDOBRONCHIAL ULTRASOUND;  Surgeon: Melrose Nakayama, MD;  Location: [facility];  Service: Thoracic;  Laterality: N/A;    FAMHx:  Family History  Problem Relation Age of Onset  . Lung cancer Mother   . Heart attack Father   . Heart failure Maternal Grandfather   . Heart attack Paternal Grandfather     SOCHx:   reports that she quit smoking about 50 years ago. Her smoking use included cigarettes. She has a 3.00 pack-year smoking history. She has never used smokeless tobacco. She reports that she drinks alcohol. She reports that she does not use drugs.  ALLERGIES:  Allergies  Allergen Reactions  . Lactose Intolerance (Gi) Other (See Comments)  . Amiodarone Other (See Comments)    Corneal deposits  . Augmentin [Amoxicillin-Pot Clavulanate] Diarrhea    *ended up with cdiff* Has patient had a PCN reaction causing  immediate rash, facial/tongue/throat swelling, SOB or lightheadedness with hypotension: No Has patient had a PCN reaction causing severe rash involving mucus membranes or skin necrosis: No Has patient had a PCN reaction that required hospitalization: Yes Has patient had a PCN reaction occurring within the last 10 years: Yes If all of the above answers are "NO", then may proceed with Cephalosporin use.   . Milk-Related Compounds Other (See Comments)    GI upset, projectile vomiting - can't take any dairy products    ROS: Pertinent  items noted in HPI and remainder of comprehensive ROS otherwise negative.  HOME MEDS: Current Outpatient Medications  Medication Sig Dispense Refill  . albuterol (PROVENTIL HFA;VENTOLIN HFA) 108 (90 BASE) MCG/ACT inhaler Inhale 1 puff into the lungs every 6 (six) hours as needed for wheezing or shortness of breath.     [name] atorvastatin (LIPITOR) 20 MG tablet Take 20 mg by mouth every morning.     . chlorthalidone (HYGROTON) 25 MG tablet TAKE 1 TABLET BY MOUTH  DAILY 90 tablet 1  . KLOR-CON 8 MEQ tablet Take 8 mEq every other day by mouth.  3  . levothyroxine (SYNTHROID, LEVOTHROID) 100 MCG tablet Take 100 mcg daily by mouth.    . lidocaine-prilocaine (EMLA) cream Apply 1 application topically as needed. (Patient taking differently: Apply 1 application topically as needed (for port access). ) 30 g 0  . losartan (COZAAR) 50 MG tablet     . metoprolol succinate (TOPROL-XL) 25 MG 24 hr tablet Take 25 mg by mouth daily.    . montelukast (SINGULAIR) 10 MG tablet Take 10 mg by mouth at bedtime.     . Multiple Vitamins-Iron (MULTIVITAMIN/IRON PO) Take 1 tablet by mouth daily with breakfast.     . pantoprazole (PROTONIX) 40 MG tablet Take 40 mg by mouth daily.    . traMADol (ULTRAM) 50 MG tablet Take 50 mg every 8 (eight) hours as needed by mouth. Take 1/2 tablet  3   No current facility-administered medications for this visit.     LABS/IMAGING: No results found for  this or any previous visit (from the past 48 hour(s)). No results found.  VITALS: BP (!) 144/68   Pulse 75   Ht 4' 10.5" (1.486 m)   Wt 161 lb 12.8 oz (73.4 kg)   BMI 33.24 kg/m   EXAM: General appearance: alert and mild distress Neck: no carotid bruit, no JVD and thyroid not enlarged, symmetric, no tenderness/mass/nodules Lungs: diminished breath sounds bilaterally Heart: regular rate and rhythm Abdomen: soft, non-tender; bowel sounds normal; no masses,  no organomegaly Extremities: extremities normal, atraumatic, no cyanosis or edema Pulses: 2+ and symmetric Skin: Pale, warm, dry Neurologic: Grossly normal Psych: Pleasant  EKG: Sinus rhythm with PACs and bigeminy at 75, poor R wave progression anteriorly-personally reviewed  ASSESSMENT: 1. Progressive fatigue and dyspnea - low risk myoview stress test (EF 60%) - 05/2016 2. Persistent cough, mostly at night 3. Lung cancer (non-small cell stage III) 4. Recurrent vasodepressor syncope  5. Lower extremity edema  6. Hypertension - blood pressure now elevated 7. Paroxysmal atrial fibrillation - no recurrence, brief and related to para-atrial tumor  PLAN: 1.   Kimberly [name] is progressively more short of breath and describing persistent cough, mostly at night.  This could certainly be related to her lung cancer or pulmonary parenchymal disease, airway disease, reflux or medications.  I will go ahead and stop her losartan today and switch her to amlodipine.  I doubt this is the likely cause of her cough.  I would also like her to see Dr. Lake Bells in pulmonary for his thoughts.  I am surprised that she has not seen a pulmonologist given her history of active lung cancer to try and optimize her lung function.  Follow-up with me in 6 months or sooner as necessary.  Pixie Casino, MD, [facility], [place] Director of the [facility]  [facility] Diplomate of the  American Board of Clinical  Lipidology Attending Cardiologist  Direct Dial: [phone]  Fax: [phone]  Website:  [email or web address] 01/02/2018, 10:44 AM

## 2018-01-15 ENCOUNTER — Ambulatory Visit (HOSPITAL_COMMUNITY)
Admission: RE | Admit: 2018-01-15 | Discharge: 2018-01-15 | Disposition: A | Discharge status: Home / Self Care | Payer: Medicare Other | Source: Ambulatory Visit | Attending: Internal Medicine | Admitting: Internal Medicine

## 2018-01-15 ENCOUNTER — Inpatient Hospital Stay: Payer: Medicare Other | Attending: Internal Medicine

## 2018-01-15 ENCOUNTER — Inpatient Hospital Stay: Payer: Medicare Other

## 2018-01-15 ENCOUNTER — Encounter (HOSPITAL_COMMUNITY): Payer: Self-pay

## 2018-01-15 DIAGNOSIS — E860 Dehydration: Secondary | ICD-10-CM

## 2018-01-15 DIAGNOSIS — R05 Cough: Secondary | ICD-10-CM | POA: Insufficient documentation

## 2018-01-15 DIAGNOSIS — Z923 Personal history of irradiation: Secondary | ICD-10-CM | POA: Insufficient documentation

## 2018-01-15 DIAGNOSIS — C342 Malignant neoplasm of middle lobe, bronchus or lung: Secondary | ICD-10-CM | POA: Diagnosis not present

## 2018-01-15 DIAGNOSIS — Z5112 Encounter for antineoplastic immunotherapy: Secondary | ICD-10-CM | POA: Insufficient documentation

## 2018-01-15 DIAGNOSIS — C3490 Malignant neoplasm of unspecified part of unspecified bronchus or lung: Secondary | ICD-10-CM

## 2018-01-15 DIAGNOSIS — N2889 Other specified disorders of kidney and ureter: Secondary | ICD-10-CM | POA: Insufficient documentation

## 2018-01-15 DIAGNOSIS — I2510 Atherosclerotic heart disease of native coronary artery without angina pectoris: Secondary | ICD-10-CM | POA: Insufficient documentation

## 2018-01-15 DIAGNOSIS — R0609 Other forms of dyspnea: Secondary | ICD-10-CM | POA: Diagnosis not present

## 2018-01-15 DIAGNOSIS — I700 Atherosclerosis of aorta: Secondary | ICD-10-CM | POA: Diagnosis not present

## 2018-01-15 DIAGNOSIS — I10 Essential (primary) hypertension: Secondary | ICD-10-CM | POA: Diagnosis not present

## 2018-01-15 LAB — CBC WITH DIFFERENTIAL (CANCER CENTER ONLY)
Basophils Absolute: 0.1 10*3/uL (ref 0.0–0.1)
Basophils Relative: 1 %
EOS PCT: 3 %
Eosinophils Absolute: 0.2 10*3/uL (ref 0.0–0.5)
HCT: 32.2 % — ABNORMAL LOW (ref 34.8–46.6)
Hemoglobin: 11.2 g/dL — ABNORMAL LOW (ref 11.6–15.9)
LYMPHS ABS: 1.4 10*3/uL (ref 0.9–3.3)
LYMPHS PCT: 21 %
MCH: 33.4 pg (ref 25.1–34.0)
MCHC: 34.8 g/dL (ref 31.5–36.0)
MCV: 96.1 fL (ref 79.5–101.0)
MONOS PCT: 8 %
Monocytes Absolute: 0.5 10*3/uL (ref 0.1–0.9)
Neutro Abs: 4.5 10*3/uL (ref 1.5–6.5)
Neutrophils Relative %: 67 %
PLATELETS: 297 10*3/uL (ref 145–400)
RBC: 3.35 MIL/uL — AB (ref 3.70–5.45)
RDW: 13.1 % (ref 11.2–14.5)
WBC Count: 6.6 10*3/uL (ref 3.9–10.3)

## 2018-01-15 LAB — CMP (CANCER CENTER ONLY)
ALBUMIN: 3.7 g/dL (ref 3.5–5.0)
ALK PHOS: 127 U/L (ref 40–150)
ALT: 15 U/L (ref 0–55)
AST: 19 U/L (ref 5–34)
Anion gap: 8 (ref 3–11)
BILIRUBIN TOTAL: 0.5 mg/dL (ref 0.2–1.2)
BUN: 16 mg/dL (ref 7–26)
CALCIUM: 10.1 mg/dL (ref 8.4–10.4)
CO2: 29 mmol/L (ref 22–29)
CREATININE: 1.02 mg/dL (ref 0.60–1.10)
Chloride: 101 mmol/L (ref 98–109)
GFR, EST NON AFRICAN AMERICAN: 53 mL/min — AB (ref >60)
GFR, Est AFR Am: >60 mL/min (ref >60)
GLUCOSE: 87 mg/dL (ref 70–140)
Potassium: 4 mmol/L (ref 3.5–5.1)
Sodium: 138 mmol/L (ref 136–145)
Total Protein: 7.2 g/dL (ref 6.4–8.3)

## 2018-01-15 MED ORDER — HEPARIN SOD (PORK) LOCK FLUSH 100 UNIT/ML IV SOLN
INTRAVENOUS | Status: AC
  Filled 2018-01-15: qty 5

## 2018-01-15 MED ORDER — IOHEXOL 300 MG/ML  SOLN
75.0000 mL | Freq: Once | INTRAMUSCULAR | Status: AC | PRN
  Administered 2018-01-15: 75 mL via INTRAVENOUS

## 2018-01-15 MED ORDER — HEPARIN SOD (PORK) LOCK FLUSH 100 UNIT/ML IV SOLN
500.0000 [IU] | Freq: Once | INTRAVENOUS | Status: AC
  Administered 2018-01-15: 500 [IU] via INTRAVENOUS

## 2018-01-15 MED ORDER — SODIUM CHLORIDE 0.9% FLUSH
10.0000 mL | INTRAVENOUS | Status: DC | PRN
  Administered 2018-01-15: 10 mL
  Filled 2018-01-15: qty 10

## 2018-01-17 ENCOUNTER — Encounter: Payer: Self-pay | Admitting: Internal Medicine

## 2018-01-17 ENCOUNTER — Inpatient Hospital Stay: Payer: Medicare Other | Admitting: Internal Medicine

## 2018-01-17 VITALS — BP 162/68 | HR 84 | Temp 98.0°F | Resp 17 | Ht 58.5 in | Wt 161.2 lb

## 2018-01-17 DIAGNOSIS — I10 Essential (primary) hypertension: Secondary | ICD-10-CM | POA: Diagnosis not present

## 2018-01-17 DIAGNOSIS — C3411 Malignant neoplasm of upper lobe, right bronchus or lung: Secondary | ICD-10-CM

## 2018-01-17 DIAGNOSIS — Z7189 Other specified counseling: Secondary | ICD-10-CM | POA: Insufficient documentation

## 2018-01-17 DIAGNOSIS — Z5112 Encounter for antineoplastic immunotherapy: Secondary | ICD-10-CM | POA: Diagnosis not present

## 2018-01-17 DIAGNOSIS — C342 Malignant neoplasm of middle lobe, bronchus or lung: Secondary | ICD-10-CM | POA: Diagnosis not present

## 2018-01-17 DIAGNOSIS — R05 Cough: Secondary | ICD-10-CM | POA: Diagnosis not present

## 2018-01-17 DIAGNOSIS — R0609 Other forms of dyspnea: Secondary | ICD-10-CM

## 2018-01-17 NOTE — Progress Notes (Signed)
START ON PATHWAY REGIMEN - Non-Small Cell Lung     A cycle is every 28 days:     Nivolumab   **Always confirm dose/schedule in your pharmacy ordering system**    Patient Characteristics: Stage IV Metastatic, Squamous, PS = 0, 1, Second Line, No Prior PD-1/PD-L1  Inhibitor and Immunotherapy Candidate AJCC T Category: T3 Current Disease Status: Distant Metastases AJCC N Category: N2 AJCC M Category: M1a AJCC 8 Stage Grouping: IVA Histology: Squamous Cell Line of therapy: Second Line PD-L1 Expression Status: Did Not Order Test Performance Status: PS = 0, 1 Immunotherapy Candidate Status: Candidate for Immunotherapy Prior Immunotherapy Status: No Prior PD-1/PD-L1 Inhibitor Intent of Therapy: Non-Curative / Palliative Intent, Discussed with Patient 

## 2018-01-17 NOTE — Progress Notes (Signed)
[place] [phone]   Fax:([phone]) [place], [place] [address] [place] [address]  DIAGNOSIS: Recurrent non-small cell lung cancer, squamous cell carcinoma initially diagnosed as unresectable a stage IIIa in February 2015.  PRIOR THERAPY: 1) Status post exploratory right VATS with mediastinal biopsy under the care of Dr. Roxan Hockey on 11/13/2013. The tumor was found to be unresectable at that time.[name] 2) Concurrent chemoradiation with weekly carboplatin for AUC of 2 and paclitaxel 45 mg/M2, status post 6 cycles with partial response. First cycle was given on 12/01/2013. 3) Consolidation chemotherapy with carboplatin for AUC of 5 and paclitaxel 175 mg/M2 every 3 weeks with Neulasta support. First dose 02/23/2014. Status post 3 cycles with partial response. 4) Systemic chemotherapy with carboplatin for AUC of 5 and paclitaxel 175 MG/M2 every 3 weeks with Neulasta support. Status post 6 cycles. First dose was given on 08/14/2016.  Last dose was giving 12/11/2016 with partial response.  CURRENT THERAPY: Second line treatment with immunotherapy with Nivolumab 480 mg IV every 4 weeks, first dose February 24, 2018.  INTERVAL HISTORY: Kimberly [name] 77 y.o. female returns to the clinic today for follow-up visit.  The patient is feeling fine today with no specific complaints except for persistent dry cough and shortness of breath with exertion.  She denied having any chest pain or hemoptysis.  She has no nausea, vomiting, diarrhea or constipation.  She denied having any recent weight loss or night sweats.  She has no headache or visual changes.  She has been in observation for the last few months.  She was seen recently by her cardiologist for evaluation of her shortness of breath and cough and she was referred to see Dr. Lake Bells for pulmonary evaluation.  She had repeat CT scan of the chest performed recently and she is  here for evaluation and discussion of her risk her results and treatment options.  MEDICAL HISTORY: Past Medical History:  Diagnosis Date  . Allergic asthma without acute exacerbation or status asthmaticus   . Aortic insufficiency   . Arthritis   . Atrial fibrillation ([place])   . Back pain 08/14/2016  . Bronchitis   . Cough    dry, endobronchial mass  . Dyslipidemia   . Family history of adverse reaction to anesthesia    pts son also experiences N/V  . GERD (gastroesophageal reflux disease)   . Heart murmur   . History of blood transfusion   . History of bronchitis   . History of chemotherapy   . History of nuclear stress test 02/26/2006   exercise myoview; normal pattern of perfusion; low risk scan   . Hypertension   . Hypothyroidism   . Mitral insufficiency   . Pneumonia   . PONV (postoperative nausea and vomiting)   . Radiation 12/01/13-01/08/14   50.4 gray to right central chest  . Renal mass, left 04/12/2017  . Shortness of breath    with exertion  . Squamous cell carcinoma of lung ([place])   . Syncope    "states she has passed out a few times. dr is trying to find cause.last time when geeting ready to go home after video bronch/bx  . Thyroid disease     ALLERGIES:  is allergic to lactose intolerance (gi); amiodarone; augmentin [amoxicillin-pot clavulanate]; and milk-related compounds.  MEDICATIONS:  Current Outpatient Medications  Medication Sig Dispense Refill  . albuterol (PROVENTIL HFA;VENTOLIN HFA) 108 (90 BASE) MCG/ACT inhaler  Inhale 1 puff into the lungs every 6 (six) hours as needed for wheezing or shortness of breath.     [name] amLODipine (NORVASC) 5 MG tablet Take 1 tablet (5 mg total) by mouth daily. 90 tablet 3  . atorvastatin (LIPITOR) 20 MG tablet Take 20 mg by mouth every morning.     . chlorthalidone (HYGROTON) 25 MG tablet TAKE 1 TABLET BY MOUTH  DAILY 90 tablet 1  . KLOR-CON 8 MEQ tablet Take 8 mEq every other day by mouth.  3  . levothyroxine (SYNTHROID,  LEVOTHROID) 100 MCG tablet Take 100 mcg daily by mouth.    . lidocaine-prilocaine (EMLA) cream Apply 1 application topically as needed. (Patient taking differently: Apply 1 application topically as needed (for port access). ) 30 g 0  . metoprolol succinate (TOPROL-XL) 25 MG 24 hr tablet Take 25 mg by mouth daily.    . montelukast (SINGULAIR) 10 MG tablet Take 10 mg by mouth at bedtime.     . Multiple Vitamins-Iron (MULTIVITAMIN/IRON PO) Take 1 tablet by mouth daily with breakfast.     . pantoprazole (PROTONIX) 40 MG tablet Take 40 mg by mouth daily.    . traMADol (ULTRAM) 50 MG tablet Take 50 mg every 8 (eight) hours as needed by mouth. Take 1/2 tablet  3   No current facility-administered medications for this visit.     SURGICAL HISTORY:  Past Surgical History:  Procedure Laterality Date  . CARDIAC CATHETERIZATION  07/08/2008   normal coronaries  . CARPAL TUNNEL RELEASE Right 1988  . COLONOSCOPY N/A 02/11/2016   Procedure: COLONOSCOPY;  Surgeon: Carol Ada, MD;  Location: [facility];  Service: Endoscopy;  Laterality: N/A;  . DILATION AND CURETTAGE OF UTERUS    . ESOPHAGOGASTRODUODENOSCOPY N/A 02/08/2017   Procedure: ESOPHAGOGASTRODUODENOSCOPY (EGD);  Surgeon: Carol Ada, MD;  Location: [facility];  Service: Endoscopy;  Laterality: N/A;  . EYE SURGERY Bilateral   . H/O MET Test w/PFT  04/02/2012   low risk; peak VO2 77% predicted  . IR GENERIC HISTORICAL  09/12/2016   IR FLUORO GUIDE PORT INSERTION RIGHT 09/12/2016 [name], MD [facility]  . IR GENERIC HISTORICAL  09/12/2016   IR US GUIDE VASC ACCESS RIGHT 09/12/2016 [name], MD [facility]  . JOINT REPLACEMENT  2003   thumb rt  . KNEE ARTHROSCOPY  12   rt meniscus  . MEDIASTINOSCOPY N/A 10/30/2013   Procedure: MEDIASTINOSCOPY;  Surgeon: Melrose Nakayama, MD;  Location: [facility];  Service: Thoracic;  Laterality: N/A;  . ROTATOR CUFF REPAIR  2006   ? side  . THYROIDECTOMY  1973  . TRANSTHORACIC ECHOCARDIOGRAM   09/25/2012   EF 55-60%; mild LVH & mild concentric hypertrophy; mild AV regurg; RV systolic pressure increase consistent with mild pulm HTN  . VIDEO ASSISTED THORACOSCOPY (VATS)/ LOBECTOMY Right 11/13/2013   Procedure: VIDEO ASSISTED THORACOSCOPY (VATS) with mediastinal  biopsies;  Surgeon: Melrose Nakayama, MD;  Location: [facility];  Service: Thoracic;  Laterality: Right;  RIGHT VATS,mediastinal biopsies  . VIDEO BRONCHOSCOPY Bilateral 09/25/2013   Procedure: VIDEO BRONCHOSCOPY WITHOUT FLUORO;  Surgeon: Tanda Rockers, MD;  Location: [facility];  Service: Cardiopulmonary;  Laterality: Bilateral;  . VIDEO BRONCHOSCOPY WITH ENDOBRONCHIAL ULTRASOUND N/A 10/30/2013   Procedure: VIDEO BRONCHOSCOPY WITH ENDOBRONCHIAL ULTRASOUND;  Surgeon: Melrose Nakayama, MD;  Location: [facility];  Service: Thoracic;  Laterality: N/A;    REVIEW OF SYSTEMS:  Constitutional: negative Eyes: negative Ears, nose, mouth, throat, and face: negative Respiratory: positive for cough  and dyspnea on exertion Cardiovascular: negative Gastrointestinal: negative Genitourinary:negative Integument/breast: negative Hematologic/lymphatic: negative Musculoskeletal:negative Neurological: negative Behavioral/Psych: negative Endocrine: negative Allergic/Immunologic: negative   PHYSICAL EXAMINATION: General appearance: alert, cooperative and no distress Head: Normocephalic, without obvious abnormality, atraumatic Neck: no adenopathy, no JVD, supple, symmetrical, trachea midline and thyroid not enlarged, symmetric, no tenderness/mass/nodules Lymph nodes: Cervical, supraclavicular, and axillary nodes normal. Resp: clear to auscultation bilaterally Back: symmetric, no curvature. ROM normal. No CVA tenderness. Cardio: regular rate and rhythm, S1, S2 normal, no murmur, click, rub or gallop GI: soft, non-tender; bowel sounds normal; no masses,  no organomegaly Extremities: extremities normal, atraumatic, no cyanosis or  edema Neurologic: Alert and oriented X 3, normal strength and tone. Normal symmetric reflexes. Normal coordination and gait  ECOG PERFORMANCE STATUS: 1 - Symptomatic but completely ambulatory  Blood pressure (!) 162/68, pulse 84, temperature 98 F (36.7 C), temperature source Oral, resp. rate 17, height 4' 10.5" (1.486 m), weight 161 lb 3.2 oz (73.1 kg), SpO2 98 %.  LABORATORY DATA: Lab Results  Component Value Date   WBC 6.6 01/15/2018   HGB 11.2 (L) 01/15/2018   HCT 32.2 (L) 01/15/2018   MCV 96.1 01/15/2018   PLT 297 01/15/2018      Chemistry      Component Value Date/Time   NA 138 01/15/2018 0959   NA 140 07/12/2017 1212   K 4.0 01/15/2018 0959   K 3.5 07/12/2017 1212   CL 101 01/15/2018 0959   CO2 29 01/15/2018 0959   CO2 27 07/12/2017 1212   BUN 16 01/15/2018 0959   BUN 27.5 (H) 07/12/2017 1212   CREATININE 1.02 01/15/2018 0959   CREATININE 1.0 07/12/2017 1212      Component Value Date/Time   CALCIUM 10.1 01/15/2018 0959   CALCIUM 10.0 07/12/2017 1212   ALKPHOS 127 01/15/2018 0959   ALKPHOS 119 07/12/2017 1212   AST 19 01/15/2018 0959   AST 16 07/12/2017 1212   ALT 15 01/15/2018 0959   ALT 15 07/12/2017 1212   BILITOT 0.5 01/15/2018 0959   BILITOT 0.48 07/12/2017 1212       RADIOGRAPHIC STUDIES: Ct Chest W Contrast  Result Date: 01/15/2018 CLINICAL DATA:  Follow-up lung cancer.  Non-small cell lung cancer. EXAM: CT CHEST WITH CONTRAST TECHNIQUE: Multidetector CT imaging of the chest was performed during intravenous contrast administration. CONTRAST:  68m OMNIPAQUE IOHEXOL 300 MG/ML  SOLN COMPARISON:  10/15/2017 FINDINGS: Cardiovascular: The heart size appears within normal limits. No pericardial effusion identified. Aortic atherosclerosis. Calcification within the LAD, RCA and left circumflex coronary arteries identified. No pericardial effusion. Mediastinum/Nodes: The trachea appears patent and is midline. Status post thyroidectomy. No enlarged mediastinal or  hilar lymph nodes. Lungs/Pleura: Lungs are clear. No pleural effusion or pneumothorax. Masslike architectural distortion, fibrosis and airspace consolidation within the right middle lobe, superior segment of right lower lobe and posteromedial right upper lobe is again noted. The right middle lobe lung mass measures 3.4 by 3.2 by 1.9 cm (volume = 11 cm^3), image 51/6. Previously this measured 3.2 x 2.7 by 1.7 cm (volume = 7.7 cm^3). Unchanged faint, barely visible ground-glass nodule within the lingula measuring 5 mm, image 83/5. Stable calcified and noncalcified nodules noted within the left lower lobe. Upper Abdomen: No acute abnormality identified. Partially exophytic mixed attenuation lesion arising from the upper pole of right kidney is again noted measuring 11 mm, image 80/6. Musculoskeletal: No chest wall abnormality. No acute or significant osseous findings. IMPRESSION: 1. Mild increase in size of right middle  lobe lung mass with surrounding changes of external beam radiation. Findings are suspicious for recurrent tumor. 2. Mixed attenuation, enhancing right kidney mass. Cannot rule out small renal cell carcinoma. 3.  Aortic Atherosclerosis (ICD10-I70.0). 4. Three vessel coronary artery atherosclerotic calcifications. Electronically Signed   By: Kerby Moors M.D.   On: 01/15/2018 16:57    ASSESSMENT AND PLAN: This is a very pleasant 77 years old white female with recurrent non-small cell lung cancer, squamous cell carcinoma. The patient completed 6 cycles of systemic chemotherapy with carboplatin and paclitaxel and tolerated her treatment well except for fatigue as well as peripheral neuropathy. The patient has been in observation.  She continues to have persistent dry cough and shortness of breath. She had repeat CT scan of the chest performed recently.  Her scan showed further mild increase in the size of the right middle lobe lung mass with surrounding changes of radiation.  But these are  suspicious for recurrent tumor. I had a lengthy discussion with the patient about her condition and treatment options.  I gave the patient the option of continuous observation and close monitoring versus proceeding with second line treatment with immunotherapy with Nivolumab 480 mg IV every 4 weeks. The patient is interested in proceeding with the immunotherapy.  I discussed with her the adverse effect of this treatment including but not limited to immunotherapy mediated skin rash, diarrhea, inflammation of the lung, kidney, liver, thyroid or other endocrine dysfunction including type 1 diabetes mellitus. She is expected to start the first cycle of this treatment on Jan 24, 2018. The patient will come back for follow-up visit in 5 weeks before starting cycle #2. For the persistent cough and shortness of breath she is scheduled to see Dr. Lake Bells for evaluation and it may be a good idea to consider her for repeat bronchoscopy and evaluation of her airway and biopsy for confirmation of the disease recurrence. For hypertension, the patient was advised to continue monitor her blood pressure closely and to call her primary care physician if no improvement. She was advised to call immediately if she has any concerning symptoms in the interval. The patient voices understanding of current disease status and treatment options and is in agreement with the current care plan. All questions were answered. The patient knows to call the clinic with any problems, questions or concerns. We can certainly see the patient much sooner if necessary.   Disclaimer: This note was dictated with voice recognition software. Similar sounding words can inadvertently be transcribed and may not be corrected upon review.

## 2018-01-21 ENCOUNTER — Telehealth: Payer: Self-pay | Admitting: Internal Medicine

## 2018-01-21 NOTE — Telephone Encounter (Signed)
Appointment scheduled Calendar/Letter mailed to patient also per 5/16 los

## 2018-01-24 ENCOUNTER — Telehealth: Payer: Self-pay | Admitting: Pulmonary Disease

## 2018-01-24 ENCOUNTER — Encounter: Payer: Self-pay | Admitting: Pulmonary Disease

## 2018-01-24 ENCOUNTER — Ambulatory Visit: Payer: Medicare Other | Admitting: Pulmonary Disease

## 2018-01-24 VITALS — BP 120/68 | HR 72 | Ht 58.5 in | Wt 161.8 lb

## 2018-01-24 DIAGNOSIS — R05 Cough: Secondary | ICD-10-CM

## 2018-01-24 DIAGNOSIS — IMO0002 Reserved for concepts with insufficient information to code with codable children: Secondary | ICD-10-CM

## 2018-01-24 DIAGNOSIS — J701 Chronic and other pulmonary manifestations due to radiation: Secondary | ICD-10-CM | POA: Diagnosis not present

## 2018-01-24 DIAGNOSIS — K219 Gastro-esophageal reflux disease without esophagitis: Secondary | ICD-10-CM

## 2018-01-24 DIAGNOSIS — C342 Malignant neoplasm of middle lobe, bronchus or lung: Secondary | ICD-10-CM

## 2018-01-24 DIAGNOSIS — J301 Allergic rhinitis due to pollen: Secondary | ICD-10-CM

## 2018-01-24 DIAGNOSIS — R059 Cough, unspecified: Secondary | ICD-10-CM

## 2018-01-24 MED ORDER — BENZONATATE 200 MG PO CAPS: 200.0000 mg | ORAL_CAPSULE | Freq: Three times a day (TID) | ORAL | 2 refills | Status: DC | PRN

## 2018-01-24 NOTE — Progress Notes (Signed)
   Subjective:    Patient ID: Kimberly [name], female    DOB: 13-Sep-1940, 77 y.o.   MRN: [id number]  HPI    Review of Systems  Constitutional: Negative for fever and unexpected weight change.  HENT: Positive for congestion and sneezing. Negative for dental problem, ear pain, nosebleeds, postnasal drip, rhinorrhea, sinus pressure, sore throat and trouble swallowing.   Eyes: Negative for redness and itching.  Respiratory: Positive for cough, shortness of breath and wheezing. Negative for chest tightness.   Cardiovascular: Negative for palpitations and leg swelling.  Gastrointestinal: Positive for nausea. Negative for vomiting.  Genitourinary: Negative for dysuria.  Musculoskeletal: Negative for joint swelling.  Skin: Negative for rash.  Allergic/Immunologic: Positive for environmental allergies. Negative for food allergies and immunocompromised state.  Neurological: Negative for headaches.  Hematological: Does not bruise/bleed easily.  Psychiatric/Behavioral: Negative for dysphoric mood. The patient is not nervous/anxious.        Objective:   Physical Exam        Assessment & Plan:

## 2018-01-24 NOTE — Patient Instructions (Signed)
Gastroesophageal reflux disease: Start taking pantoprazole twice a day Follow the gastroesophageal reflux disease lifestyle modification sheet we gave you  Postnasal drip due to allergic rhinitis: Continue taking Singulair Start taking over-the-counter generic Flonase (fluticasone) 2 sprays each nostril daily If the postnasal drip is more steady or intense use saline rinses or sprays as needed If you are still having symptoms despite these measures start taking over-the-counter Zyrtec, generic is okay  Cough: After you have taken the above listed measures for 2 weeks I would like for you to do the following: You need to try to suppress your cough to allow your larynx (voice box) to heal.  For three days don't talk, laugh, sing, or clear your throat. Do everything you can to suppress the cough during this time. Use hard candies (sugarless Jolly Ranchers) or non-mint or non-menthol containing cough drops during this time to soothe your throat.  Use a cough suppressant (Delsym or what I have prescribed you) around the clock during this time.  After three days, gradually increase the use of your voice and back off on the cough suppressants.  Follow up in 4 weeks with me or an NP

## 2018-01-24 NOTE — Progress Notes (Signed)
Synopsis: Referred in May 2019 for cough in the setting of squamous cell carcinoma of the lung.  She had a VATS biopsy and mediastinal exploration in March 2015.  Tumor was found to be unresectable at that time.  Underwent concurrent chemoradiation starting in 2015.  Had consolidation chemotherapy with carboplatin and paclitaxel in June 2015.  Underwent systemic chemotherapy with carboplatin and paclitaxel in late 2017 through early 2018.  As of her first visit with the pulmonary clinic in May 2019 she was currently on immunotherapy with Nivolumab   Subjective:   PATIENT ID: Kimberly [name] GENDER: female DOB: 1941/08/30, MRN: [id number]   HPI  Chief Complaint  Patient presents with  . Consult    Lung cancer since 2015, SOB and cough has progressively been getting worse.    Sukhmani has lung cancer and is here to see me for a cough: > she says that this week is pretty good, but it has been bothering her some > dry cough > worse when "changing environments" > mold and mildew make it worse > talking on the phone makes it worse > sometimes it is so bad that she will throw up > it has been present for at least 5 years, has become progressively worse lately > she says it is interfering with the quality of her life now > perfume makes it worse > this doesn't correlate with cancer treatement.  > her cough is sometimes worse when lying flat at night > she raises her head most of the time to help with breathing > she says that the cough is worse with talking > starts with a tickle in her throat > she says taht a few years ago she had pneumonia and a codeine based cough syrup made it better > cough drops made her mouth break out in sores > hard candies help  She was never told she had lung problems.  Smoked briefly in college.  Her mother also had lung cancer.   She says that her nose is always runny.  She takes nose spray.   She has acid reflux, heartburn > not all the time >  sometimes she feels like something is coming up in her chest, makes her feel worse > eating will make her cough worse   Lung cancer > was diagnosed in 2015  Past Medical History:  Diagnosis Date  . Allergic asthma without acute exacerbation or status asthmaticus   . Aortic insufficiency   . Arthritis   . Atrial fibrillation ([place])   . Back pain 08/14/2016  . Bronchitis   . Cough    dry, endobronchial mass  . Dyslipidemia   . Family history of adverse reaction to anesthesia    pts son also experiences N/V  . GERD (gastroesophageal reflux disease)   . Heart murmur   . History of blood transfusion   . History of bronchitis   . History of chemotherapy   . History of nuclear stress test 02/26/2006   exercise myoview; normal pattern of perfusion; low risk scan   . Hypertension   . Hypothyroidism   . Mitral insufficiency   . Pneumonia   . PONV (postoperative nausea and vomiting)   . Radiation 12/01/13-01/08/14   50.4 gray to right central chest  . Renal mass, left 04/12/2017  . Shortness of breath    with exertion  . Squamous cell carcinoma of lung ([place])   . Syncope    "states she has passed out a few times. dr  is trying to find cause.last time when geeting ready to go home after video bronch/bx  . Thyroid disease      Family History  Problem Relation Age of Onset  . Lung cancer Mother   . Heart attack Father   . Heart failure Maternal Grandfather   . Heart attack Paternal Grandfather      Social History   Socioeconomic History  . Marital status: Divorced    Spouse name: Not on file  . Number of children: 2  . Years of education: Not on file  . Highest education level: Not on file  Occupational History  . Not on file  Social Needs  . Financial resource strain: Not on file  . Food insecurity:    Worry: Not on file    Inability: Not on file  . Transportation needs:    Medical: Not on file    Non-medical: Not on file  Tobacco Use  . Smoking status: Former Smoker      Packs/day: 0.50    Years: 6.00    Pack years: 3.00    Types: Cigarettes    Last attempt to quit: 09/05/1967    Years since quitting: 50.4  . Smokeless tobacco: Never Used  . Tobacco comment: occ wine  Substance and Sexual Activity  . Alcohol use: Yes    Comment: occasional 1-2 drinks/month  . Drug use: No  . Sexual activity: Not Currently  Lifestyle  . Physical activity:    Days per week: Not on file    Minutes per session: Not on file  . Stress: Not on file  Relationships  . Social connections:    Talks on phone: Not on file    Gets together: Not on file    Attends religious service: Not on file    Active member of club or organization: Not on file    Attends meetings of clubs or organizations: Not on file    Relationship status: Not on file  . Intimate partner violence:    Fear of current or ex partner: Not on file    Emotionally abused: Not on file    Physically abused: Not on file    Forced sexual activity: Not on file  Other Topics Concern  . Not on file  Social History Narrative  . Not on file     Allergies  Allergen Reactions  . Lactose Intolerance (Gi) Other (See Comments)  . Amiodarone Other (See Comments)    Corneal deposits  . Augmentin [Amoxicillin-Pot Clavulanate] Diarrhea    *ended up with cdiff* Has patient had a PCN reaction causing immediate rash, facial/tongue/throat swelling, SOB or lightheadedness with hypotension: No Has patient had a PCN reaction causing severe rash involving mucus membranes or skin necrosis: No Has patient had a PCN reaction that required hospitalization: Yes Has patient had a PCN reaction occurring within the last 10 years: Yes If all of the above answers are "NO", then may proceed with Cephalosporin use.   . Milk-Related Compounds Other (See Comments)    GI upset, projectile vomiting - can't take any dairy products     Outpatient Medications Prior to Visit  Medication Sig Dispense Refill  . albuterol (PROVENTIL  HFA;VENTOLIN HFA) 108 (90 BASE) MCG/ACT inhaler Inhale 1 puff into the lungs every 6 (six) hours as needed for wheezing or shortness of breath.     [name] amLODipine (NORVASC) 5 MG tablet Take 1 tablet (5 mg total) by mouth daily. 90 tablet 3  . atorvastatin (LIPITOR)  20 MG tablet Take 20 mg by mouth every morning.     . chlorthalidone (HYGROTON) 25 MG tablet TAKE 1 TABLET BY MOUTH  DAILY 90 tablet 1  . KLOR-CON 8 MEQ tablet Take 8 mEq every other day by mouth.  3  . levothyroxine (SYNTHROID, LEVOTHROID) 100 MCG tablet Take 100 mcg daily by mouth.    . lidocaine-prilocaine (EMLA) cream Apply 1 application topically as needed. (Patient taking differently: Apply 1 application topically as needed (for port access). ) 30 g 0  . metoprolol succinate (TOPROL-XL) 25 MG 24 hr tablet Take 25 mg by mouth daily.    . montelukast (SINGULAIR) 10 MG tablet Take 10 mg by mouth at bedtime.     . Multiple Vitamins-Iron (MULTIVITAMIN/IRON PO) Take 1 tablet by mouth daily with breakfast.     . pantoprazole (PROTONIX) 40 MG tablet Take 40 mg by mouth daily.    . traMADol (ULTRAM) 50 MG tablet Take 50 mg every 8 (eight) hours as needed by mouth. Take 1/2 tablet  3   No facility-administered medications prior to visit.     Review of Systems  Constitutional: Positive for malaise/fatigue. Negative for chills, fever and weight loss.  HENT: Positive for congestion. Negative for nosebleeds, sinus pain and sore throat.   Eyes: Negative for photophobia, pain and discharge.  Respiratory: Positive for cough and sputum production. Negative for hemoptysis and wheezing.   Cardiovascular: Negative for chest pain, palpitations, orthopnea and leg swelling.  Gastrointestinal: Negative for abdominal pain, constipation, diarrhea, nausea and vomiting.  Genitourinary: Negative for dysuria, frequency, hematuria and urgency.  Musculoskeletal: Negative for back pain, joint pain, myalgias and neck pain.  Skin: Negative for itching and rash.    Neurological: Negative for tingling, tremors, sensory change, speech change, focal weakness, seizures, weakness and headaches.  Psychiatric/Behavioral: Negative for memory loss, substance abuse and suicidal ideas. The patient is not nervous/anxious.       Objective:  Physical Exam   Vitals:   01/24/18 0857  BP: 120/68  Pulse: 72  SpO2: 96%  Weight: 73.4 kg (161 lb 12.8 oz)  Height: 4' 10.5" (1.486 m)    Gen: well appearing, no acute distress HENT: NCAT, OP clear, neck supple without masses Eyes: PERRL, EOMi Lymph: no cervical lymphadenopathy PULM: CTA B CV: RRR, no mgr, no JVD GI: BS+, soft, nontender, no hsm Derm: no rash or skin breakdown MSK: normal bulk and tone Neuro: A&Ox4, CN II-XII intact, strength 5/5 in all 4 extremities Psyche: normal mood and affect   CBC    Component Value Date/Time   WBC 6.6 01/15/2018 0959   WBC 7.4 10/15/2017 1116   RBC 3.35 (L) 01/15/2018 0959   HGB 11.2 (L) 01/15/2018 0959   HGB 11.3 (L) 07/12/2017 1212   HCT 32.2 (L) 01/15/2018 0959   HCT 33.1 (L) 07/12/2017 1212   PLT 297 01/15/2018 0959   PLT 251 07/12/2017 1212   MCV 96.1 01/15/2018 0959   MCV 101.9 (H) 07/12/2017 1212   MCH 33.4 01/15/2018 0959   MCHC 34.8 01/15/2018 0959   RDW 13.1 01/15/2018 0959   RDW 12.3 07/12/2017 1212   LYMPHSABS 1.4 01/15/2018 0959   LYMPHSABS 1.4 07/12/2017 1212   MONOABS 0.5 01/15/2018 0959   MONOABS 0.6 07/12/2017 1212   EOSABS 0.2 01/15/2018 0959   EOSABS 0.2 07/12/2017 1212   BASOSABS 0.1 01/15/2018 0959   BASOSABS 0.1 07/12/2017 1212     Chest imaging: May 2019 CT chest images independently reviewed showing right  hilar mass, what appears to be 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  PFT: 2015 lung function testing reviewed where there was no airflow obstruction, no diffusion abnormalities.  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 Meth presents to my clinic today for evaluation of cough.  There is multiple causes here: Acid reflux, postnasal drip from allergic rhinitis, and likely radiation fibrosis.  I also think there is a component of cyclical cough as she has been coughing for for 5 years.  Patients in this situation will have ongoing laryngeal inflammation and irritation that leads to a perpetual cough.  I think we need to get her acid reflux under control and her postnasal drip under better control and then encouraged voice rest to allow her vocal cords to heal up.  That all being said, with the radiation fibrosis changes I explained to her today that I doubt we will ever be able to make the cough go away but hopefully we can help control the severity of the symptoms.  Plan: Gastroesophageal reflux disease: Start taking pantoprazole twice a day Follow the gastroesophageal reflux disease lifestyle modification sheet we gave you  Postnasal drip due to allergic rhinitis: Continue taking Singulair Start taking over-the-counter generic Flonase (fluticasone) 2 sprays each nostril daily If the postnasal drip is more steady or intense use saline rinses or sprays as needed If you are still having symptoms despite these measures start taking over-the-counter Zyrtec, generic is okay  Cough: After you have taken the above listed measures for 2 weeks I would like for you to do the following: You need to try to suppress your cough to allow your larynx (voice box) to heal.  For three days don't talk, laugh, sing, or clear your throat. Do everything you can to suppress the cough during this time. Use hard candies (sugarless Jolly Ranchers) or non-mint or non-menthol containing cough drops during this time to soothe  your throat.  Use a cough suppressant (Delsym or what I have prescribed you) around the clock during this time.  After three days, gradually increase the use of your voice and back off on the cough suppressants. Tessalon 200mg  by mouth every 8 hours as needed for cough  Follow up in 4 weeks with me or an NP     Current Outpatient Medications:  .  albuterol (PROVENTIL HFA;VENTOLIN HFA) 108 (90 BASE) MCG/ACT inhaler, Inhale 1 puff into the lungs every 6 (six) hours as needed for wheezing or shortness of breath. , Disp: , Rfl:  .  amLODipine (NORVASC) 5 MG tablet, Take 1 tablet (5 mg total) by mouth daily., Disp: 90 tablet, Rfl: 3 .  atorvastatin (LIPITOR) 20 MG tablet, Take 20 mg by mouth every morning. , Disp: , Rfl:  .  chlorthalidone (HYGROTON) 25 MG tablet, TAKE 1 TABLET BY MOUTH  DAILY, Disp: 90 tablet, Rfl: 1 .  KLOR-CON 8 MEQ tablet, Take 8 mEq every other day by mouth., Disp: , Rfl: 3 .  levothyroxine (SYNTHROID, LEVOTHROID) 100 MCG tablet, Take 100 mcg daily by mouth., Disp: , Rfl:  .  lidocaine-prilocaine (EMLA) cream, Apply 1 application topically as needed. (Patient taking differently: Apply 1 application topically as needed (for port access). ), Disp:  30 g, Rfl: 0 .  metoprolol succinate (TOPROL-XL) 25 MG 24 hr tablet, Take 25 mg by mouth daily., Disp: , Rfl:  .  montelukast (SINGULAIR) 10 MG tablet, Take 10 mg by mouth at bedtime. , Disp: , Rfl:  .  Multiple Vitamins-Iron (MULTIVITAMIN/IRON PO), Take 1 tablet by mouth daily with breakfast. , Disp: , Rfl:  .  pantoprazole (PROTONIX) 40 MG tablet, Take 40 mg by mouth daily., Disp: , Rfl:  .  traMADol (ULTRAM) 50 MG tablet, Take 50 mg every 8 (eight) hours as needed by mouth. Take 1/2 tablet, Disp: , Rfl: 3

## 2018-01-24 NOTE — Telephone Encounter (Signed)
Spoke with pt. States that Lavella Alastair Hennes is not covered by her insurance. She is requesting an alternative.  Dr. Lake Bells - please advise. Thanks.

## 2018-01-24 NOTE — Telephone Encounter (Signed)
Called and spoke with Patient about BQ recommendations about Delsym OTC.  Patient stated understanding.  Nothing further needed at this time.

## 2018-01-24 NOTE — Telephone Encounter (Signed)
She should try Delsym OTC then as all other Rx cough medicines are going to contain narcotics and she doesn't want to go that route.

## 2018-01-25 ENCOUNTER — Inpatient Hospital Stay: Payer: Medicare Other

## 2018-01-25 VITALS — BP 147/85 | HR 55 | Temp 97.9°F | Resp 18

## 2018-01-25 DIAGNOSIS — C3411 Malignant neoplasm of upper lobe, right bronchus or lung: Secondary | ICD-10-CM

## 2018-01-25 DIAGNOSIS — Z5112 Encounter for antineoplastic immunotherapy: Secondary | ICD-10-CM | POA: Diagnosis not present

## 2018-01-25 DIAGNOSIS — C342 Malignant neoplasm of middle lobe, bronchus or lung: Secondary | ICD-10-CM | POA: Diagnosis not present

## 2018-01-25 DIAGNOSIS — I10 Essential (primary) hypertension: Secondary | ICD-10-CM | POA: Diagnosis not present

## 2018-01-25 DIAGNOSIS — E860 Dehydration: Secondary | ICD-10-CM

## 2018-01-25 DIAGNOSIS — R05 Cough: Secondary | ICD-10-CM | POA: Diagnosis not present

## 2018-01-25 DIAGNOSIS — R0609 Other forms of dyspnea: Secondary | ICD-10-CM | POA: Diagnosis not present

## 2018-01-25 LAB — TSH: TSH: 1.163 u[IU]/mL (ref 0.308–3.960)

## 2018-01-25 LAB — CMP (CANCER CENTER ONLY)
ALT: 14 U/L (ref 0–55)
AST: 17 U/L (ref 5–34)
Albumin: 3.6 g/dL (ref 3.5–5.0)
Alkaline Phosphatase: 134 U/L (ref 40–150)
Anion gap: 9 (ref 3–11)
BUN: 21 mg/dL (ref 7–26)
CHLORIDE: 103 mmol/L (ref 98–109)
CO2: 28 mmol/L (ref 22–29)
CREATININE: 0.97 mg/dL (ref 0.60–1.10)
Calcium: 10.1 mg/dL (ref 8.4–10.4)
GFR, EST NON AFRICAN AMERICAN: 56 mL/min — AB (ref >60)
GFR, Est AFR Am: >60 mL/min (ref >60)
Glucose, Bld: 96 mg/dL (ref 70–140)
Potassium: 4 mmol/L (ref 3.5–5.1)
Sodium: 140 mmol/L (ref 136–145)
Total Bilirubin: 0.4 mg/dL (ref 0.2–1.2)
Total Protein: 7.1 g/dL (ref 6.4–8.3)

## 2018-01-25 LAB — CBC WITH DIFFERENTIAL (CANCER CENTER ONLY)
Basophils Absolute: 0.1 10*3/uL (ref 0.0–0.1)
Basophils Relative: 1 %
EOS ABS: 0.2 10*3/uL (ref 0.0–0.5)
EOS PCT: 3 %
HCT: 32.3 % — ABNORMAL LOW (ref 34.8–46.6)
Hemoglobin: 11.1 g/dL — ABNORMAL LOW (ref 11.6–15.9)
LYMPHS ABS: 1.2 10*3/uL (ref 0.9–3.3)
Lymphocytes Relative: 16 %
MCH: 33.1 pg (ref 25.1–34.0)
MCHC: 34.3 g/dL (ref 31.5–36.0)
MCV: 96.4 fL (ref 79.5–101.0)
MONO ABS: 0.5 10*3/uL (ref 0.1–0.9)
MONOS PCT: 7 %
Neutro Abs: 5.7 10*3/uL (ref 1.5–6.5)
Neutrophils Relative %: 73 %
PLATELETS: 289 10*3/uL (ref 145–400)
RBC: 3.35 MIL/uL — ABNORMAL LOW (ref 3.70–5.45)
RDW: 13 % (ref 11.2–14.5)
WBC Count: 7.7 10*3/uL (ref 3.9–10.3)

## 2018-01-25 MED ORDER — SODIUM CHLORIDE 0.9% FLUSH
10.0000 mL | INTRAVENOUS | Status: DC | PRN
  Administered 2018-01-25: 10 mL
  Filled 2018-01-25: qty 10

## 2018-01-25 MED ORDER — SODIUM CHLORIDE 0.9 % IV SOLN
480.0000 mg | Freq: Once | INTRAVENOUS | Status: AC
  Administered 2018-01-25: 480 mg via INTRAVENOUS
  Filled 2018-01-25: qty 48

## 2018-01-25 MED ORDER — HEPARIN SOD (PORK) LOCK FLUSH 100 UNIT/ML IV SOLN
500.0000 [IU] | Freq: Once | INTRAVENOUS | Status: AC | PRN
  Administered 2018-01-25: 500 [IU]
  Filled 2018-01-25: qty 5

## 2018-01-25 MED ORDER — SODIUM CHLORIDE 0.9 % IV SOLN
Freq: Once | INTRAVENOUS | Status: AC
Start: 2018-01-25 — End: 2018-01-25
  Administered 2018-01-25: 11:00:00 via INTRAVENOUS

## 2018-01-25 NOTE — Patient Instructions (Signed)
[place] Discharge Instructions for Patients Receiving Chemotherapy  Today you received the following chemotherapy agents nivolumab (Opdivo)  To help prevent nausea and vomiting after your treatment, we encourage you to take your nausea medication as directed by your doctor.   If you develop nausea and vomiting that is not controlled by your nausea medication, call the clinic.   BELOW ARE SYMPTOMS THAT SHOULD BE REPORTED IMMEDIATELY:  *FEVER GREATER THAN 100.5 F  *CHILLS WITH OR WITHOUT FEVER  NAUSEA AND VOMITING THAT IS NOT CONTROLLED WITH YOUR NAUSEA MEDICATION  *UNUSUAL SHORTNESS OF BREATH  *UNUSUAL BRUISING OR BLEEDING  TENDERNESS IN MOUTH AND THROAT WITH OR WITHOUT PRESENCE OF ULCERS  *URINARY PROBLEMS  *BOWEL PROBLEMS  UNUSUAL RASH Items with * indicate a potential emergency and should be followed up as soon as possible.  Feel free to call the clinic should you have any questions or concerns. The clinic phone number is [phone].  Please show the [place] at check-in to the Emergency Department and triage nurse.  Nivolumab injection What is this medicine? NIVOLUMAB (nye VOL ue mab) is a monoclonal antibody. It is used to treat melanoma, lung cancer, kidney cancer, head and neck cancer, Hodgkin lymphoma, urothelial cancer, colon cancer, and liver cancer. This medicine may be used for other purposes; ask your health care provider or pharmacist if you have questions. COMMON BRAND NAME(S): Opdivo What should I tell my health care provider before I take this medicine? They need to know if you have any of these conditions: -diabetes -immune system problems -kidney disease -liver disease -lung disease -organ transplant -stomach or intestine problems -thyroid disease -an unusual or allergic reaction to nivolumab, other medicines, foods, dyes, or preservatives -pregnant or trying to get pregnant -breast-feeding How should I use this  medicine? This medicine is for infusion into a vein. It is given by a health care professional in a hospital or clinic setting. A special MedGuide will be given to you before each treatment. Be sure to read this information carefully each time. Talk to your pediatrician regarding the use of this medicine in children. While this drug may be prescribed for children as young as 12 years for selected conditions, precautions do apply. Overdosage: If you think you have taken too much of this medicine contact a poison control center or emergency room at once. NOTE: This medicine is only for you. Do not share this medicine with others. What if I miss a dose? It is important not to miss your dose. Call your doctor or health care professional if you are unable to keep an appointment. What may interact with this medicine? Interactions have not been studied. Give your health care provider a list of all the medicines, herbs, non-prescription drugs, or dietary supplements you use. Also tell them if you smoke, drink alcohol, or use illegal drugs. Some items may interact with your medicine. This list may not describe all possible interactions. Give your health care provider a list of all the medicines, herbs, non-prescription drugs, or dietary supplements you use. Also tell them if you smoke, drink alcohol, or use illegal drugs. Some items may interact with your medicine. What should I watch for while using this medicine? This drug may make you feel generally unwell. Continue your course of treatment even though you feel ill unless your doctor tells you to stop. You may need blood work done while you are taking this medicine. Do not become pregnant while taking this medicine or for  5 months after stopping it. Women should inform their doctor if they wish to become pregnant or think they might be pregnant. There is a potential for serious side effects to an unborn child. Talk to your health care professional or  pharmacist for more information. Do not breast-feed an infant while taking this medicine. What side effects may I notice from receiving this medicine? Side effects that you should report to your doctor or health care professional as soon as possible: -allergic reactions like skin rash, itching or hives, swelling of the face, lips, or tongue -black, tarry stools -blood in the urine -bloody or watery diarrhea -changes in vision -change in sex drive -changes in emotions or moods -chest pain -confusion -cough -decreased appetite -diarrhea -facial flushing -feeling faint or lightheaded -fever, chills -hair loss -hallucination, loss of contact with reality -headache -irritable -joint pain -loss of memory -muscle pain -muscle weakness -seizures -shortness of breath -signs and symptoms of high blood sugar such as dizziness; dry mouth; dry skin; fruity breath; nausea; stomach pain; increased hunger or thirst; increased urination -signs and symptoms of kidney injury like trouble passing urine or change in the amount of urine -signs and symptoms of liver injury like dark yellow or brown urine; general ill feeling or flu-like symptoms; light-colored stools; loss of appetite; nausea; right upper belly pain; unusually weak or tired; yellowing of the eyes or skin -stiff neck -swelling of the ankles, feet, hands -weight gain Side effects that usually do not require medical attention (report to your doctor or health care professional if they continue or are bothersome): -bone pain -constipation -tiredness -vomiting This list may not describe all possible side effects. Call your doctor for medical advice about side effects. You may report side effects to FDA at 1-800-FDA-1088. Where should I keep my medicine? This drug is given in a hospital or clinic and will not be stored at home. NOTE: This sheet is a summary. It may not cover all possible information. If you have questions about this  medicine, talk to your doctor, pharmacist, or health care provider.  2018 Elsevier/Gold Standard (2016-05-29 17:49:34)

## 2018-02-21 ENCOUNTER — Inpatient Hospital Stay: Payer: Medicare Other | Admitting: Nurse Practitioner

## 2018-02-21 ENCOUNTER — Inpatient Hospital Stay: Payer: Medicare Other

## 2018-02-21 ENCOUNTER — Encounter: Payer: Self-pay | Admitting: Internal Medicine

## 2018-02-21 ENCOUNTER — Inpatient Hospital Stay: Payer: Medicare Other | Admitting: Internal Medicine

## 2018-02-21 ENCOUNTER — Telehealth: Payer: Self-pay | Admitting: Internal Medicine

## 2018-02-21 ENCOUNTER — Inpatient Hospital Stay: Payer: Medicare Other | Attending: Internal Medicine

## 2018-02-21 VITALS — BP 141/79 | HR 80 | Temp 98.4°F | Resp 18 | Ht 58.5 in | Wt 157.3 lb

## 2018-02-21 DIAGNOSIS — R0602 Shortness of breath: Secondary | ICD-10-CM

## 2018-02-21 DIAGNOSIS — E876 Hypokalemia: Secondary | ICD-10-CM | POA: Insufficient documentation

## 2018-02-21 DIAGNOSIS — C3411 Malignant neoplasm of upper lobe, right bronchus or lung: Secondary | ICD-10-CM

## 2018-02-21 DIAGNOSIS — Z5112 Encounter for antineoplastic immunotherapy: Secondary | ICD-10-CM

## 2018-02-21 DIAGNOSIS — C342 Malignant neoplasm of middle lobe, bronchus or lung: Secondary | ICD-10-CM | POA: Insufficient documentation

## 2018-02-21 DIAGNOSIS — I10 Essential (primary) hypertension: Secondary | ICD-10-CM

## 2018-02-21 DIAGNOSIS — Z79899 Other long term (current) drug therapy: Secondary | ICD-10-CM | POA: Insufficient documentation

## 2018-02-21 DIAGNOSIS — E860 Dehydration: Secondary | ICD-10-CM

## 2018-02-21 DIAGNOSIS — R05 Cough: Secondary | ICD-10-CM | POA: Insufficient documentation

## 2018-02-21 LAB — CBC WITH DIFFERENTIAL (CANCER CENTER ONLY)
BASOS PCT: 1 %
Basophils Absolute: 0.1 10*3/uL (ref 0.0–0.1)
EOS ABS: 0.3 10*3/uL (ref 0.0–0.5)
EOS PCT: 3 %
HCT: 33 % — ABNORMAL LOW (ref 34.8–46.6)
HEMOGLOBIN: 11.2 g/dL — AB (ref 11.6–15.9)
LYMPHS ABS: 1.1 10*3/uL (ref 0.9–3.3)
Lymphocytes Relative: 14 %
MCH: 32.7 pg (ref 25.1–34.0)
MCHC: 34 g/dL (ref 31.5–36.0)
MCV: 95.9 fL (ref 79.5–101.0)
MONO ABS: 0.5 10*3/uL (ref 0.1–0.9)
MONOS PCT: 7 %
Neutro Abs: 5.8 10*3/uL (ref 1.5–6.5)
Neutrophils Relative %: 75 %
Platelet Count: 295 10*3/uL (ref 145–400)
RBC: 3.44 MIL/uL — ABNORMAL LOW (ref 3.70–5.45)
RDW: 13 % (ref 11.2–14.5)
WBC Count: 7.7 10*3/uL (ref 3.9–10.3)

## 2018-02-21 LAB — CMP (CANCER CENTER ONLY)
ALBUMIN: 3.6 g/dL (ref 3.5–5.0)
ALK PHOS: 132 U/L (ref 40–150)
ALT: 10 U/L (ref 0–55)
AST: 18 U/L (ref 5–34)
Anion gap: 10 (ref 3–11)
BUN: 27 mg/dL — AB (ref 7–26)
CALCIUM: 9.9 mg/dL (ref 8.4–10.4)
CHLORIDE: 100 mmol/L (ref 98–109)
CO2: 30 mmol/L — AB (ref 22–29)
CREATININE: 1.2 mg/dL — AB (ref 0.60–1.10)
GFR, Est AFR Am: 50 mL/min — ABNORMAL LOW (ref >60)
GFR, Estimated: 43 mL/min — ABNORMAL LOW (ref >60)
GLUCOSE: 130 mg/dL (ref 70–140)
Potassium: 3.3 mmol/L — ABNORMAL LOW (ref 3.5–5.1)
SODIUM: 140 mmol/L (ref 136–145)
Total Bilirubin: 0.4 mg/dL (ref 0.2–1.2)
Total Protein: 7.3 g/dL (ref 6.4–8.3)

## 2018-02-21 LAB — TSH: TSH: 1.324 u[IU]/mL (ref 0.308–3.960)

## 2018-02-21 MED ORDER — HEPARIN SOD (PORK) LOCK FLUSH 100 UNIT/ML IV SOLN
500.0000 [IU] | Freq: Once | INTRAVENOUS | Status: AC | PRN
  Administered 2018-02-21: 500 [IU]
  Filled 2018-02-21: qty 5

## 2018-02-21 MED ORDER — SODIUM CHLORIDE 0.9 % IV SOLN
480.0000 mg | Freq: Once | INTRAVENOUS | Status: AC
  Administered 2018-02-21: 480 mg via INTRAVENOUS
  Filled 2018-02-21: qty 48

## 2018-02-21 MED ORDER — SODIUM CHLORIDE 0.9% FLUSH
10.0000 mL | INTRAVENOUS | Status: DC | PRN
  Administered 2018-02-21: 10 mL
  Filled 2018-02-21: qty 10

## 2018-02-21 MED ORDER — SODIUM CHLORIDE 0.9 % IV SOLN
Freq: Once | INTRAVENOUS | Status: AC
  Administered 2018-02-21: 12:00:00 via INTRAVENOUS

## 2018-02-21 NOTE — Progress Notes (Signed)
[name] [phone]   Fax:([phone]) [place], MD [address] [place] [address]  DIAGNOSIS: Recurrent non-small cell lung cancer, squamous cell carcinoma initially diagnosed as unresectable a stage IIIa in February 2015.  PRIOR THERAPY: 1) Status post exploratory right VATS with mediastinal biopsy under the care of Dr. Roxan Hockey on 11/13/2013. The tumor was found to be unresectable at that time.Kimberly [name] 2) Concurrent chemoradiation with weekly carboplatin for AUC of 2 and paclitaxel 45 mg/M2, status post 6 cycles with partial response. First cycle was given on 12/01/2013. 3) Consolidation chemotherapy with carboplatin for AUC of 5 and paclitaxel 175 mg/M2 every 3 weeks with Neulasta support. First dose 02/23/2014. Status post 3 cycles with partial response. 4) Systemic chemotherapy with carboplatin for AUC of 5 and paclitaxel 175 MG/M2 every 3 weeks with Neulasta support. Status post 6 cycles. First dose was given on 08/14/2016.  Last dose was giving 12/11/2016 with partial response.  CURRENT THERAPY: Second line treatment with immunotherapy with Nivolumab 480 mg IV every 4 weeks, first dose Jan 25, 2018.  Status post 1 cycle.  INTERVAL HISTORY: Kimberly [name] 77 y.o. female returns to the clinic today for follow-up visit.  The patient tolerated the first cycle of her treatment with Nivolumab fairly well.  She had 2 days of diarrhea treated with Imodium and Lomotil with improvement.  She denied having any chest pain, shortness of breath, cough or hemoptysis.  She denied having any recent weight loss or night sweats.  She has no nausea, vomiting, diarrhea or constipation.  She is here today for evaluation before starting cycle #2.  MEDICAL HISTORY: Past Medical History:  Diagnosis Date  . Allergic asthma without acute exacerbation or status asthmaticus   . Aortic insufficiency   . Arthritis   . Atrial  fibrillation ([place])   . Back pain 08/14/2016  . Bronchitis   . Cough    dry, endobronchial mass  . Dyslipidemia   . Family history of adverse reaction to anesthesia    pts son also experiences N/V  . GERD (gastroesophageal reflux disease)   . Heart murmur   . History of blood transfusion   . History of bronchitis   . History of chemotherapy   . History of nuclear stress test 02/26/2006   exercise myoview; normal pattern of perfusion; low risk scan   . Hypertension   . Hypothyroidism   . Mitral insufficiency   . Pneumonia   . PONV (postoperative nausea and vomiting)   . Radiation 12/01/13-01/08/14   50.4 gray to right central chest  . Renal mass, left 04/12/2017  . Shortness of breath    with exertion  . Squamous cell carcinoma of lung ([place])   . Syncope    "states she has passed out a few times. dr is trying to find cause.last time when geeting ready to go home after video bronch/bx  . Thyroid disease     ALLERGIES:  is allergic to lactose intolerance (gi); amiodarone; augmentin [amoxicillin-pot clavulanate]; and milk-related compounds.  MEDICATIONS:  Current Outpatient Medications  Medication Sig Dispense Refill  . albuterol (PROVENTIL HFA;VENTOLIN HFA) 108 (90 BASE) MCG/ACT inhaler Inhale 1 puff into the lungs every 6 (six) hours as needed for wheezing or shortness of breath.     Kimberly [name] amLODipine (NORVASC) 5 MG tablet Take 1 tablet (5 mg total) by mouth daily. 90 tablet 3  . atorvastatin (LIPITOR) 20 MG tablet Take  20 mg by mouth every morning.     . benzonatate (TESSALON) 200 MG capsule Take 1 capsule (200 mg total) by mouth every 8 (eight) hours as needed for cough. 45 capsule 2  . chlorthalidone (HYGROTON) 25 MG tablet TAKE 1 TABLET BY MOUTH  DAILY 90 tablet 1  . KLOR-CON 8 MEQ tablet Take 8 mEq every other day by mouth.  3  . levothyroxine (SYNTHROID, LEVOTHROID) 100 MCG tablet Take 100 mcg daily by mouth.    . lidocaine-prilocaine (EMLA) cream Apply 1 application topically as  needed. (Patient taking differently: Apply 1 application topically as needed (for port access). ) 30 g 0  . metoprolol succinate (TOPROL-XL) 25 MG 24 hr tablet Take 25 mg by mouth daily.    . montelukast (SINGULAIR) 10 MG tablet Take 10 mg by mouth at bedtime.     . Multiple Vitamins-Iron (MULTIVITAMIN/IRON PO) Take 1 tablet by mouth daily with breakfast.     . pantoprazole (PROTONIX) 40 MG tablet Take 40 mg by mouth daily.    . traMADol (ULTRAM) 50 MG tablet Take 50 mg every 8 (eight) hours as needed by mouth. Take 1/2 tablet  3   No current facility-administered medications for this visit.     SURGICAL HISTORY:  Past Surgical History:  Procedure Laterality Date  . CARDIAC CATHETERIZATION  07/08/2008   normal coronaries  . CARPAL TUNNEL RELEASE Right 1988  . COLONOSCOPY N/A 02/11/2016   Procedure: COLONOSCOPY;  Surgeon: Carol Ada, MD;  Location: [facility];  Service: Endoscopy;  Laterality: N/A;  . DILATION AND CURETTAGE OF UTERUS    . ESOPHAGOGASTRODUODENOSCOPY N/A 02/08/2017   Procedure: ESOPHAGOGASTRODUODENOSCOPY (EGD);  Surgeon: Carol Ada, MD;  Location: [facility];  Service: Endoscopy;  Laterality: N/A;  . EYE SURGERY Bilateral   . H/O MET Test w/PFT  04/02/2012   low risk; peak VO2 77% predicted  . IR GENERIC HISTORICAL  09/12/2016   IR FLUORO GUIDE PORT INSERTION RIGHT 09/12/2016 [name], MD [facility]  . IR GENERIC HISTORICAL  09/12/2016   IR US GUIDE VASC ACCESS RIGHT 09/12/2016 [name], MD [facility]  . JOINT REPLACEMENT  2003   thumb rt  . KNEE ARTHROSCOPY  12   rt meniscus  . MEDIASTINOSCOPY N/A 10/30/2013   Procedure: MEDIASTINOSCOPY;  Surgeon: Melrose Nakayama, MD;  Location: [facility];  Service: Thoracic;  Laterality: N/A;  . ROTATOR CUFF REPAIR  2006   ? side  . THYROIDECTOMY  1973  . TRANSTHORACIC ECHOCARDIOGRAM  09/25/2012   EF 55-60%; mild LVH & mild concentric hypertrophy; mild AV regurg; RV systolic pressure increase consistent with mild  pulm HTN  . VIDEO ASSISTED THORACOSCOPY (VATS)/ LOBECTOMY Right 11/13/2013   Procedure: VIDEO ASSISTED THORACOSCOPY (VATS) with mediastinal  biopsies;  Surgeon: Melrose Nakayama, MD;  Location: [facility];  Service: Thoracic;  Laterality: Right;  RIGHT VATS,mediastinal biopsies  . VIDEO BRONCHOSCOPY Bilateral 09/25/2013   Procedure: VIDEO BRONCHOSCOPY WITHOUT FLUORO;  Surgeon: Tanda Rockers, MD;  Location: [facility];  Service: Cardiopulmonary;  Laterality: Bilateral;  . VIDEO BRONCHOSCOPY WITH ENDOBRONCHIAL ULTRASOUND N/A 10/30/2013   Procedure: VIDEO BRONCHOSCOPY WITH ENDOBRONCHIAL ULTRASOUND;  Surgeon: Melrose Nakayama, MD;  Location: [facility];  Service: Thoracic;  Laterality: N/A;    REVIEW OF SYSTEMS:  A comprehensive review of systems was negative except for: Constitutional: positive for fatigue   PHYSICAL EXAMINATION: General appearance: alert, cooperative and no distress Head: Normocephalic, without obvious abnormality, atraumatic Neck: no adenopathy, no JVD, supple, symmetrical,  trachea midline and thyroid not enlarged, symmetric, no tenderness/mass/nodules Lymph nodes: Cervical, supraclavicular, and axillary nodes normal. Resp: clear to auscultation bilaterally Back: symmetric, no curvature. ROM normal. No CVA tenderness. Cardio: regular rate and rhythm, S1, S2 normal, no murmur, click, rub or gallop GI: soft, non-tender; bowel sounds normal; no masses,  no organomegaly Extremities: extremities normal, atraumatic, no cyanosis or edema  ECOG PERFORMANCE STATUS: 1 - Symptomatic but completely ambulatory  Blood pressure (!) 141/79, pulse 80, temperature 98.4 F (36.9 C), temperature source Oral, resp. rate 18, height 4' 10.5" (1.486 m), weight 157 lb 4.8 oz (71.4 kg), SpO2 100 %.  LABORATORY DATA: Lab Results  Component Value Date   WBC 7.7 02/21/2018   HGB 11.2 (L) 02/21/2018   HCT 33.0 (L) 02/21/2018   MCV 95.9 02/21/2018   PLT 295 02/21/2018      Chemistry        Component Value Date/Time   NA 140 02/21/2018 0941   NA 140 07/12/2017 1212   K 3.3 (L) 02/21/2018 0941   K 3.5 07/12/2017 1212   CL 100 02/21/2018 0941   CO2 30 (H) 02/21/2018 0941   CO2 27 07/12/2017 1212   BUN 27 (H) 02/21/2018 0941   BUN 27.5 (H) 07/12/2017 1212   CREATININE 1.20 (H) 02/21/2018 0941   CREATININE 1.0 07/12/2017 1212      Component Value Date/Time   CALCIUM 9.9 02/21/2018 0941   CALCIUM 10.0 07/12/2017 1212   ALKPHOS 132 02/21/2018 0941   ALKPHOS 119 07/12/2017 1212   AST 18 02/21/2018 0941   AST 16 07/12/2017 1212   ALT 10 02/21/2018 0941   ALT 15 07/12/2017 1212   BILITOT 0.4 02/21/2018 0941   BILITOT 0.48 07/12/2017 1212       RADIOGRAPHIC STUDIES: No results found.  ASSESSMENT AND PLAN: This is a very pleasant 77 years old white female with recurrent non-small cell lung cancer, squamous cell carcinoma. The patient completed 6 cycles of systemic chemotherapy with carboplatin and paclitaxel and tolerated her treatment well except for fatigue as well as peripheral neuropathy. The patient has been in observation.  She continues to have persistent dry cough and shortness of breath. She had evidence for disease progression on restaging scan. The patient was a started on treatment with second line immunotherapy with Nivolumab 480 mg IV every 4 weeks status post 1 cycle.  She tolerated the first cycle of her treatment fairly well. I recommended for the patient to proceed with cycle #2 today as scheduled. For the hypokalemia, she was advised to increase potassium supplements in her diet. The patient will come back for follow-up visit in 4 weeks before the next cycle of her treatment.  She was advised to call immediately if she has any concerning symptoms in the interval. The patient voices understanding of current disease status and treatment options and is in agreement with the current care plan. All questions were answered. The patient knows to call the  clinic with any problems, questions or concerns. We can certainly see the patient much sooner if necessary.   Disclaimer: This note was dictated with voice recognition software. Similar sounding words can inadvertently be transcribed and may not be corrected upon review.

## 2018-02-21 NOTE — Telephone Encounter (Signed)
Scheduled appt per 6/20 los - pt to get an updated schedule in the treatment area.

## 2018-02-21 NOTE — Patient Instructions (Signed)
[facility] Discharge Instructions for Patients Receiving Chemotherapy  Today you received the following chemotherapy agents nivolumab (Opdivo)  To help prevent nausea and vomiting after your treatment, we encourage you to take your nausea medication as directed by your doctor.   If you develop nausea and vomiting that is not controlled by your nausea medication, call the clinic.   BELOW ARE SYMPTOMS THAT SHOULD BE REPORTED IMMEDIATELY:  *FEVER GREATER THAN 100.5 F  *CHILLS WITH OR WITHOUT FEVER  NAUSEA AND VOMITING THAT IS NOT CONTROLLED WITH YOUR NAUSEA MEDICATION  *UNUSUAL SHORTNESS OF BREATH  *UNUSUAL BRUISING OR BLEEDING  TENDERNESS IN MOUTH AND THROAT WITH OR WITHOUT PRESENCE OF ULCERS  *URINARY PROBLEMS  *BOWEL PROBLEMS  UNUSUAL RASH Items with * indicate a potential emergency and should be followed up as soon as possible.  Feel free to call the clinic should you have any questions or concerns. The clinic phone number is [phone].  Please show the [facility] at check-in to the Emergency Department and triage nurse.  Nivolumab injection What is this medicine? NIVOLUMAB (nye VOL ue mab) is a monoclonal antibody. It is used to treat melanoma, lung cancer, kidney cancer, head and neck cancer, Hodgkin lymphoma, urothelial cancer, colon cancer, and liver cancer. This medicine may be used for other purposes; ask your health care provider or pharmacist if you have questions. COMMON BRAND NAME(S): Opdivo What should I tell my health care provider before I take this medicine? They need to know if you have any of these conditions: -diabetes -immune system problems -kidney disease -liver disease -lung disease -organ transplant -stomach or intestine problems -thyroid disease -an unusual or allergic reaction to nivolumab, other medicines, foods, dyes, or preservatives -pregnant or trying to get pregnant -breast-feeding How should I use this  medicine? This medicine is for infusion into a vein. It is given by a health care professional in a hospital or clinic setting. A special MedGuide will be given to you before each treatment. Be sure to read this information carefully each time. Talk to your pediatrician regarding the use of this medicine in children. While this drug may be prescribed for children as young as 12 years for selected conditions, precautions do apply. Overdosage: If you think you have taken too much of this medicine contact a poison control center or emergency room at once. NOTE: This medicine is only for you. Do not share this medicine with others. What if I miss a dose? It is important not to miss your dose. Call your doctor or health care professional if you are unable to keep an appointment. What may interact with this medicine? Interactions have not been studied. Give your health care provider a list of all the medicines, herbs, non-prescription drugs, or dietary supplements you use. Also tell them if you smoke, drink alcohol, or use illegal drugs. Some items may interact with your medicine. This list may not describe all possible interactions. Give your health care provider a list of all the medicines, herbs, non-prescription drugs, or dietary supplements you use. Also tell them if you smoke, drink alcohol, or use illegal drugs. Some items may interact with your medicine. What should I watch for while using this medicine? This drug may make you feel generally unwell. Continue your course of treatment even though you feel ill unless your doctor tells you to stop. You may need blood work done while you are taking this medicine. Do not become pregnant while taking this medicine or for  5 months after stopping it. Women should inform their doctor if they wish to become pregnant or think they might be pregnant. There is a potential for serious side effects to an unborn child. Talk to your health care professional or  pharmacist for more information. Do not breast-feed an infant while taking this medicine. What side effects may I notice from receiving this medicine? Side effects that you should report to your doctor or health care professional as soon as possible: -allergic reactions like skin rash, itching or hives, swelling of the face, lips, or tongue -black, tarry stools -blood in the urine -bloody or watery diarrhea -changes in vision -change in sex drive -changes in emotions or moods -chest pain -confusion -cough -decreased appetite -diarrhea -facial flushing -feeling faint or lightheaded -fever, chills -hair loss -hallucination, loss of contact with reality -headache -irritable -joint pain -loss of memory -muscle pain -muscle weakness -seizures -shortness of breath -signs and symptoms of high blood sugar such as dizziness; dry mouth; dry skin; fruity breath; nausea; stomach pain; increased hunger or thirst; increased urination -signs and symptoms of kidney injury like trouble passing urine or change in the amount of urine -signs and symptoms of liver injury like dark yellow or brown urine; general ill feeling or flu-like symptoms; light-colored stools; loss of appetite; nausea; right upper belly pain; unusually weak or tired; yellowing of the eyes or skin -stiff neck -swelling of the ankles, feet, hands -weight gain Side effects that usually do not require medical attention (report to your doctor or health care professional if they continue or are bothersome): -bone pain -constipation -tiredness -vomiting This list may not describe all possible side effects. Call your doctor for medical advice about side effects. You may report side effects to FDA at 1-800-FDA-1088. Where should I keep my medicine? This drug is given in a hospital or clinic and will not be stored at home. NOTE: This sheet is a summary. It may not cover all possible information. If you have questions about this  medicine, talk to your doctor, pharmacist, or health care provider.  2018 Elsevier/Gold Standard (2016-05-29 17:49:34)

## 2018-02-21 NOTE — Patient Instructions (Signed)
Implanted Port Home Guide An implanted port is a type of central line that is placed under the skin. Central lines are used to provide IV access when treatment or nutrition needs to be given through a person's veins. Implanted ports are used for long-term IV access. An implanted port may be placed because:  You need IV medicine that would be irritating to the small veins in your hands or arms.  You need long-term IV medicines, such as antibiotics.  You need IV nutrition for a long period.  You need frequent blood draws for lab tests.  You need dialysis.  Implanted ports are usually placed in the chest area, but they can also be placed in the upper arm, the abdomen, or the leg. An implanted port has two main parts:  Reservoir. The reservoir is round and will appear as a small, raised area under your skin. The reservoir is the part where a needle is inserted to give medicines or draw blood.  Catheter. The catheter is a thin, flexible tube that extends from the reservoir. The catheter is placed into a large vein. Medicine that is inserted into the reservoir goes into the catheter and then into the vein.  How will I care for my incision site? Do not get the incision site wet. Bathe or shower as directed by your health care provider. How is my port accessed? Special steps must be taken to access the port:  Before the port is accessed, a numbing cream can be placed on the skin. This helps numb the skin over the port site.  Your health care provider uses a sterile technique to access the port. ? Your health care provider must put on a mask and sterile gloves. ? The skin over your port is cleaned carefully with an antiseptic and allowed to dry. ? The port is gently pinched between sterile gloves, and a needle is inserted into the port.  Only "non-coring" port needles should be used to access the port. Once the port is accessed, a blood return should be checked. This helps ensure that the port  is in the vein and is not clogged.  If your port needs to remain accessed for a constant infusion, a clear (transparent) bandage will be placed over the needle site. The bandage and needle will need to be changed every week, or as directed by your health care provider.  Keep the bandage covering the needle clean and dry. Do not get it wet. Follow your health care provider's instructions on how to take a shower or bath while the port is accessed.  If your port does not need to stay accessed, no bandage is needed over the port.  What is flushing? Flushing helps keep the port from getting clogged. Follow your health care provider's instructions on how and when to flush the port. Ports are usually flushed with saline solution or a medicine called heparin. The need for flushing will depend on how the port is used.  If the port is used for intermittent medicines or blood draws, the port will need to be flushed: ? After medicines have been given. ? After blood has been drawn. ? As part of routine maintenance.  If a constant infusion is running, the port may not need to be flushed.  How long will my port stay implanted? The port can stay in for as long as your health care provider thinks it is needed. When it is time for the port to come out, surgery will be   done to remove it. The procedure is similar to the one performed when the port was put in. When should I seek immediate medical care? When you have an implanted port, you should seek immediate medical care if:  You notice a bad smell coming from the incision site.  You have swelling, redness, or drainage at the incision site.  You have more swelling or pain at the port site or the surrounding area.  You have a fever that is not controlled with medicine.  This information is not intended to replace advice given to you by your health care provider. Make sure you discuss any questions you have with your health care provider. Document  Released: 08/21/2005 Document Revised: 01/27/2016 Document Reviewed: 04/28/2013 Elsevier Interactive Patient Education  2017 [employer]  

## 2018-02-25 ENCOUNTER — Encounter: Payer: Self-pay | Admitting: Acute Care

## 2018-02-25 ENCOUNTER — Ambulatory Visit: Payer: Medicare Other | Admitting: Acute Care

## 2018-02-25 DIAGNOSIS — R059 Cough, unspecified: Secondary | ICD-10-CM

## 2018-02-25 DIAGNOSIS — R05 Cough: Secondary | ICD-10-CM | POA: Diagnosis not present

## 2018-02-25 DIAGNOSIS — C3490 Malignant neoplasm of unspecified part of unspecified bronchus or lung: Secondary | ICD-10-CM | POA: Insufficient documentation

## 2018-02-25 MED ORDER — PANTOPRAZOLE SODIUM 40 MG PO TBEC: 40.0000 mg | DELAYED_RELEASE_TABLET | Freq: Two times a day (BID) | ORAL | 1 refills | Status: DC

## 2018-02-25 NOTE — Assessment & Plan Note (Signed)
Follow up per Dr. Earlie Server

## 2018-02-25 NOTE — Patient Instructions (Addendum)
It is nice to meet you today. We will send in a prescription for pantoprazole>> 90 day supply. Continue to take this twice daily as you have been doing. Continue to follow the GERD diet. Use hard candies (sugarless Jolly Ranchers) or non-mint or non-menthol containing cough drops during this time to soothe your throat.   Use a cough suppressant (Delsym or what I have prescribed you) around the clock during this time.   Tessalon 200mg  by mouth every 8 hours as needed for cough Voice rest as needed Sugar free candies for throat soothing as needed. Continue Singulair for your post nasal drip. Continue Flonase 2 sprays each nostril daily. You can add saline rinses if needed. If you continue to have symptoms, please add OTC Zyrtec to the regimen Follow up with Dr. Lake Bells or Judson Roch NP in 4 months Please contact office for sooner follow up if symptoms do not improve or worsen or seek emergency care

## 2018-02-25 NOTE — Progress Notes (Signed)
History of Present Illness Kimberly [name] is a 77 y.o. female former smoker with recurrent non-small cell, squamous cell carcinoma of the right lung. She was  diagnosed 2015.  Synopsis: Referred in May 2019 for cough in the setting of squamous cell carcinoma of the lung.  She had a VATS biopsy and mediastinal exploration in March 2015.  Tumor was found to be unresectable at that time.  Underwent concurrent chemoradiation starting in 2015.  Had consolidation chemotherapy with carboplatin and paclitaxel in June 2015.  Underwent systemic chemotherapy with carboplatin and paclitaxel in late 2017 through early 2018.  As of her first visit with the pulmonary clinic in May 2019 she was currently on immunotherapy with Nivolumab   PRIOR THERAPY: 1) Status post exploratory right VATS with mediastinal biopsy under the care of Dr. Roxan Hockey on 11/13/2013. The tumor was found to be unresectable at that time.[name] 2) Concurrent chemoradiation with weekly carboplatin for AUC of 2 and paclitaxel 45 mg/M2, status post 6 cycles with partial response. First cycle was given on 12/01/2013. 3) Consolidation chemotherapy with carboplatin for AUC of 5 and paclitaxel 175 mg/M2 every 3 weeks with Neulasta support. First dose 02/23/2014. Status post 3 cycles with partial response. 4) Systemic chemotherapy with carboplatin for AUC of 5 and paclitaxel 175 MG/M2 every 3 weeks with Neulasta support. Status post 6 cycles. First dose was given on 08/14/2016.  Last dose was giving 12/11/2016 with partial response.  CURRENT THERAPY:  Second line treatment with immunotherapy with Nivolumab 480 mg IV every 4 weeks, first dose Jan 25, 2018.  Status post 1 cycle.    02/25/2018  1 month follow up for cough.  She was seen by Dr. 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  He felt there was also a component of cyclical cough as she  has been coughing for for 5 years. She does have some triggers, but she feels she is doing much better.She has followed all Dr. Anastasia Pall recommendations , and she is much better. She feels the reflux interventions have made a huge difference.. She is following the GERD diet. She has not needed the cough medication since after the first few days She is also avoiding mints, menthol and cough drops . She is using  sugar free hard candy as needed for cough.She feels her wheezing is better but not gone.She has only had to use her inhaler x 1 in the last month.She states she rarely has a productive cough. It is a dry cough.No fever, chest pain, orthopnea or hemoptysis. She states she is sleeping better at night and is no longer awakened by her cough. She is following up with Dr. Earlie Server regarding treatment of her recurrent lung cancer.She has multiple appointments scheduled.She denies any fever, chest pain, orthopnea or hemoptysis. She has not had to add Zyrtec to her daily regimen, as he cough is better.   Test Results: 01/15/2018 CT Chest with contrast Mild increase in size of right middle lobe lung mass with surrounding changes of external beam radiation. Findings are suspicious for recurrent tumor. Mixed attenuation, enhancing right kidney mass. Cannot rule out small renal cell carcinoma. Aortic Atherosclerosis (ICD10-I70.0). Three vessel coronary artery atherosclerotic calcifications.  CBC Latest Ref Rng & Units 02/21/2018 01/25/2018 01/15/2018  WBC 3.9 - 10.3 K/uL 7.7 7.7 6.6  Hemoglobin 11.6 - 15.9 g/dL 11.2(L) 11.1(L) 11.2(L)  Hematocrit 34.8 - 46.6 % 33.0(L) 32.3(L) 32.2(L)  Platelets  145 - 400 K/uL 295 289 297    BMP Latest Ref Rng & Units 02/21/2018 01/25/2018 01/15/2018  Glucose 70 - 140 mg/dL 130 96 87  BUN 7 - 26 mg/dL 27(H) 21 16  Creatinine 0.60 - 1.10 mg/dL 1.20(H) 0.97 1.02  Sodium 136 - 145 mmol/L 140 140 138  Potassium 3.5 - 5.1 mmol/L 3.3(L) 4.0 4.0  Chloride 98 - 109 mmol/L 100  103 101  CO2 22 - 29 mmol/L 30(H) 28 29  Calcium 8.4 - 10.4 mg/dL 9.9 10.1 10.1    BNP No results found for: BNP  ProBNP    Component Value Date/Time   PROBNP 2,388.0 (H) 02/07/2014 2123    PFT    Component Value Date/Time   FEV1PRE 1.47 10/06/2013 1154   FEV1POST 1.64 10/06/2013 1154   FVCPRE 1.95 10/06/2013 1154   FVCPOST 1.99 10/06/2013 1154   TLC 3.76 10/06/2013 1154   DLCOUNC 13.95 10/06/2013 1154   PREFEV1FVCRT 76 10/06/2013 1154   PSTFEV1FVCRT 83 10/06/2013 1154    No results found.   Past medical hx Past Medical History:  Diagnosis Date  . Allergic asthma without acute exacerbation or status asthmaticus   . Aortic insufficiency   . Arthritis   . Atrial fibrillation ([place])   . Back pain 08/14/2016  . Bronchitis   . Cough    dry, endobronchial mass  . Dyslipidemia   . Family history of adverse reaction to anesthesia    pts son also experiences N/V  . GERD (gastroesophageal reflux disease)   . Heart murmur   . History of blood transfusion   . History of bronchitis   . History of chemotherapy   . History of nuclear stress test 02/26/2006   exercise myoview; normal pattern of perfusion; low risk scan   . Hypertension   . Hypothyroidism   . Mitral insufficiency   . Pneumonia   . PONV (postoperative nausea and vomiting)   . Radiation 12/01/13-01/08/14   50.4 gray to right central chest  . Renal mass, left 04/12/2017  . Shortness of breath    with exertion  . Squamous cell carcinoma of lung ([place])   . Syncope    "states she has passed out a few times. dr is trying to find cause.last time when geeting ready to go home after video bronch/bx  . Thyroid disease      Social History   Tobacco Use  . Smoking status: Former Smoker    Packs/day: 0.50    Years: 6.00    Pack years: 3.00    Types: Cigarettes    Last attempt to quit: 09/05/1967    Years since quitting: 50.5  . Smokeless tobacco: Never Used  . Tobacco comment: occ wine  Substance Use Topics  .  Alcohol use: Yes    Comment: occasional 1-2 drinks/month  . Drug use: No    [name] reports that she quit smoking about 50 years ago. Her smoking use included cigarettes. She has a 3.00 pack-year smoking history. She has never used smokeless tobacco. She reports that she drinks alcohol. She reports that she does not use drugs.  Tobacco Cessation: Counseling given: Not Answered Comment: occ wine   Past surgical hx, Family hx, Social hx all reviewed.  Current Outpatient Medications on File Prior to Visit  Medication Sig  . albuterol (PROVENTIL HFA;VENTOLIN HFA) 108 (90 BASE) MCG/ACT inhaler Inhale 1 puff into the lungs every 6 (six) hours as needed for wheezing or shortness of breath.   [name]  amLODipine (NORVASC) 5 MG tablet Take 1 tablet (5 mg total) by mouth daily.  [name] atorvastatin (LIPITOR) 20 MG tablet Take 20 mg by mouth every morning.   . benzonatate (TESSALON) 200 MG capsule Take 1 capsule (200 mg total) by mouth every 8 (eight) hours as needed for cough.  . chlorthalidone (HYGROTON) 25 MG tablet TAKE 1 TABLET BY MOUTH  DAILY  . KLOR-CON 8 MEQ tablet Take 8 mEq every other day by mouth.  . levothyroxine (SYNTHROID, LEVOTHROID) 100 MCG tablet Take 100 mcg daily by mouth.  . lidocaine-prilocaine (EMLA) cream Apply 1 application topically as needed. (Patient taking differently: Apply 1 application topically as needed (for port access). )  . metoprolol succinate (TOPROL-XL) 25 MG 24 hr tablet Take 25 mg by mouth daily.  . montelukast (SINGULAIR) 10 MG tablet Take 10 mg by mouth at bedtime.   . Multiple Vitamins-Iron (MULTIVITAMIN/IRON PO) Take 1 tablet by mouth daily with breakfast.   . traMADol (ULTRAM) 50 MG tablet Take 50 mg every 8 (eight) hours as needed by mouth. Take 1/2 tablet   No current facility-administered medications on file prior to visit.      Allergies  Allergen Reactions  . Lactose Intolerance (Gi) Other (See Comments)  . Amiodarone Other (See Comments)    Corneal  deposits  . Augmentin [Amoxicillin-Pot Clavulanate] Diarrhea    *ended up with cdiff* Has patient had a PCN reaction causing immediate rash, facial/tongue/throat swelling, SOB or lightheadedness with hypotension: No Has patient had a PCN reaction causing severe rash involving mucus membranes or skin necrosis: No Has patient had a PCN reaction that required hospitalization: Yes Has patient had a PCN reaction occurring within the last 10 years: Yes If all of the above answers are "NO", then may proceed with Cephalosporin use.   . Milk-Related Compounds Other (See Comments)    GI upset, projectile vomiting - can't take any dairy products    Review Of Systems:  Constitutional:   No  weight loss, night sweats,  Fevers, chills, fatigue, or  lassitude.  HEENT:   No headaches,  Difficulty swallowing,  Tooth/dental problems, or  Sore throat,                No sneezing, itching, ear ache, nasal congestion, post nasal drip,   CV:  No chest pain,  Orthopnea, PND, swelling in lower extremities, anasarca, dizziness, palpitations, syncope.   GI  No heartburn, indigestion, abdominal pain, nausea, vomiting, diarrhea, change in bowel habits, loss of appetite, bloody stools.   Resp: No shortness of breath with exertion or at rest.  No excess mucus, no productive cough,  No non-productive cough,  No coughing up of blood.  No change in color of mucus.  No wheezing.  No chest wall deformity  Skin: no rash or lesions.  GU: no dysuria, change in color of urine, no urgency or frequency.  No flank pain, no hematuria   MS:  No joint pain or swelling.  No decreased range of motion.  No back pain.  Psych:  No change in mood or affect. No depression or anxiety.  No memory loss.   Vital Signs BP (!) 142/80 (BP Location: Left Arm, Cuff Size: Normal)   Pulse 77   Ht 4\' 10"  (1.473 m)   Wt 157 lb (71.2 kg)   SpO2 97%   BMI 32.81 kg/m    Physical Exam:  General- No distress,  A&Ox3 ENT: No sinus  tenderness, TM clear, pale nasal mucosa, no  oral exudate,no post nasal drip, no LAN Cardiac: S1, S2, regular rate and rhythm, no murmur Chest: No wheeze/ rales/ dullness; no accessory muscle use, no nasal flaring, no sternal retractions Abd.: Soft Non-tender Ext: No clubbing cyanosis, edema Neuro:  normal strength Skin: No rashes, warm and dry Psych: normal mood and behavior   Assessment/Plan  Cough Much improved after following regimen Dr. Lake Bells  Prescribed. She feels the GERD regimen was the culprit Plan: We will send in a prescription for pantoprazole>> 90 day supply. Continue to take this twice daily as you have been doing. Continue to follow the GERD diet. Use hard candies (sugarless Jolly Ranchers) or non-mint or non-menthol containing cough drops during this time to soothe your throat.   Use a cough suppressant (Delsym or what I have prescribed you) around the clock during this time.   Tessalon 200mg  by mouth every 8 hours as needed for cough Voice rest as needed Sugar free candies for throat soothing as needed. Continue Singulair for your post nasal drip. Continue Flonase 2 sprays each nostril daily. You can add saline rinses if needed. If you continue to have symptoms, please add OTC Zyrtec to the regimen Follow up with Dr. Lake Bells or Judson Roch NP in 4 months Please contact office for sooner follow up if symptoms do not improve or worsen or seek emergency care   Recurrent non-small cell lung cancer ([place]) Follow up per Dr. Adron Bene, NP 02/25/2018  4:14 PM

## 2018-02-25 NOTE — Assessment & Plan Note (Signed)
Much improved after following regimen Dr. Lake Bells  Prescribed. She feels the GERD regimen was the culprit Plan: We will send in a prescription for pantoprazole>> 90 day supply. Continue to take this twice daily as you have been doing. Continue to follow the GERD diet. Use hard candies (sugarless Jolly Ranchers) or non-mint or non-menthol containing cough drops during this time to soothe your throat.   Use a cough suppressant (Delsym or what I have prescribed you) around the clock during this time.   Tessalon 200mg  by mouth every 8 hours as needed for cough Voice rest as needed Sugar free candies for throat soothing as needed. Continue Singulair for your post nasal drip. Continue Flonase 2 sprays each nostril daily. You can add saline rinses if needed. If you continue to have symptoms, please add OTC Zyrtec to the regimen Follow up with Dr. Lake Bells or Judson Roch NP in 4 months Please contact office for sooner follow up if symptoms do not improve or worsen or seek emergency care

## 2018-03-04 DIAGNOSIS — E876 Hypokalemia: Secondary | ICD-10-CM | POA: Diagnosis not present

## 2018-03-04 DIAGNOSIS — K219 Gastro-esophageal reflux disease without esophagitis: Secondary | ICD-10-CM | POA: Diagnosis not present

## 2018-03-11 DIAGNOSIS — N289 Disorder of kidney and ureter, unspecified: Secondary | ICD-10-CM | POA: Diagnosis not present

## 2018-03-11 DIAGNOSIS — N261 Atrophy of kidney (terminal): Secondary | ICD-10-CM | POA: Diagnosis not present

## 2018-03-11 DIAGNOSIS — N2889 Other specified disorders of kidney and ureter: Secondary | ICD-10-CM | POA: Diagnosis not present

## 2018-03-13 DIAGNOSIS — E039 Hypothyroidism, unspecified: Secondary | ICD-10-CM | POA: Diagnosis not present

## 2018-03-20 NOTE — Assessment & Plan Note (Addendum)
This is a very pleasant 77 year old white female with recurrent non-small cell lung cancer, squamous cell carcinoma. The patient completed 6 cycles of systemic chemotherapy with carboplatin and paclitaxel and tolerated her treatment well except for fatigue as well as peripheral neuropathy. The patient has been in observation.  She continued to have persistent dry cough and shortness of breath. She had evidence for disease progression on restaging scan. The patient was a started on treatment with second line immunotherapy with Nivolumab 480 mg IV every 4 weeks status post 2 cycles.    She has tolerated her treatment fairly well overall with the exception of intermittent nausea and vomiting and weight loss. Recommend for the patient to proceed with cycle 3 of her treatment today as scheduled.  She will have a restaging CT scan of the chest prior to her next visit.  She will follow-up in 4 weeks for evaluation prior to cycle 4 and to review her restaging CT scan results.  Labs were reviewed and BUN and creatinine noted to be slightly elevated.  We will give her 500 cc of normal saline infusion today.  She was encouraged to increase her oral fluid intake.  For the nausea and vomiting, I have prescribed Compazine 10 mg every 6 hours as needed for nausea and vomiting.  She will let us know if this is not effective.  The patient's potassium level is slightly low.  I have advised her to increase her potassium from every other day to once a day.  For her weight loss, I have referred her to the dietitian.  I have encouraged her to restart nutritional supplements.  She was advised to call immediately if she has any concerning symptoms in the interval. The patient voices understanding of current disease status and treatment options and is in agreement with the current care plan. All questions were answered. The patient knows to call the clinic with any problems, questions or concerns. We can certainly see the  patient much sooner if necessary.

## 2018-03-20 NOTE — Progress Notes (Signed)
[facility] OFFICE PROGRESS NOTE  Deland Pretty, MD [address] [place] [address]  DIAGNOSIS: Recurrent non-small cell lung cancer, squamous cell carcinoma initially diagnosed as unresectable a stage IIIa in February 2015.  PRIOR THERAPY: 1) Status post exploratory right VATS with mediastinal biopsy under the care of Dr. Roxan Hockey on 11/13/2013. The tumor was found to be unresectable at that time.[name] 2) Concurrent chemoradiation with weekly carboplatin for AUC of 2 and paclitaxel 45 mg/M2, status post 6 cycles with partial response. First cycle was given on 12/01/2013. 3) Consolidation chemotherapy with carboplatin for AUC of 5 and paclitaxel 175 mg/M2 every 3 weeks with Neulasta support. First dose 02/23/2014. Status post 3 cycles with partial response. 4) Systemic chemotherapy with carboplatin for AUC of 5 and paclitaxel 175 MG/M2 every 3 weeks with Neulasta support. Status post 6 cycles. First dose was given on 08/14/2016.  Last dose was giving 12/11/2016 with partial response.  CURRENT THERAPY: Second line treatment with immunotherapy with Nivolumab 480 mg IV every 4 weeks, first dose Jan 25, 2018.  Status post 2 cycles.  INTERVAL HISTORY: ROSALI AUGELLO 77 y.o. female returns for routine follow-up visit accompanied by her daughter.  Patient reports that she been having more nausea since her last treatment.  She vomits intermittently.  She states that she is vomited up approximately 7-8 times over the past month.  By medics at home.  She reports a decreased appetite and she has lost weight since her last visit.  She denies fevers and chills.  Denies chest pain, shortness of breath, cough, hemoptysis.  Denies nausea or vomiting today.  Denies diarrhea and constipation.  The patient is here for evaluation prior to starting cycle #3 of nivolumab.  MEDICAL HISTORY: Past Medical History:  Diagnosis Date  . Allergic asthma without acute exacerbation or  status asthmaticus   . Aortic insufficiency   . Arthritis   . Atrial fibrillation ([place])   . Back pain 08/14/2016  . Bronchitis   . Cough    dry, endobronchial mass  . Dyslipidemia   . Family history of adverse reaction to anesthesia    pts son also experiences N/V  . GERD (gastroesophageal reflux disease)   . Heart murmur   . History of blood transfusion   . History of bronchitis   . History of chemotherapy   . History of nuclear stress test 02/26/2006   exercise myoview; normal pattern of perfusion; low risk scan   . Hypertension   . Hypothyroidism   . Mitral insufficiency   . Pneumonia   . PONV (postoperative nausea and vomiting)   . Radiation 12/01/13-01/08/14   50.4 gray to right central chest  . Renal mass, left 04/12/2017  . Shortness of breath    with exertion  . Squamous cell carcinoma of lung ([place])   . Syncope    "states she has passed out a few times. dr is trying to find cause.last time when geeting ready to go home after video bronch/bx  . Thyroid disease     ALLERGIES:  is allergic to lactose intolerance (gi); amiodarone; augmentin [amoxicillin-pot clavulanate]; and milk-related compounds.  MEDICATIONS:  Current Outpatient Medications  Medication Sig Dispense Refill  . albuterol (PROVENTIL HFA;VENTOLIN HFA) 108 (90 BASE) MCG/ACT inhaler Inhale 1 puff into the lungs every 6 (six) hours as needed for wheezing or shortness of breath.     [name] amLODipine (NORVASC) 5 MG tablet Take 1 tablet (5 mg total) by mouth daily. [place]  tablet 3  . atorvastatin (LIPITOR) 20 MG tablet Take 20 mg by mouth every morning.     . benzonatate (TESSALON) 200 MG capsule Take 1 capsule (200 mg total) by mouth every 8 (eight) hours as needed for cough. 45 capsule 2  . chlorthalidone (HYGROTON) 25 MG tablet TAKE 1 TABLET BY MOUTH  DAILY 90 tablet 1  . KLOR-CON 8 MEQ tablet Take 8 mEq every other day by mouth.  3  . levothyroxine (SYNTHROID, LEVOTHROID) 100 MCG tablet Take 100 mcg daily by mouth.     . lidocaine-prilocaine (EMLA) cream Apply 1 application topically as needed. (Patient taking differently: Apply 1 application topically as needed (for port access). ) 30 g 0  . metoprolol succinate (TOPROL-XL) 25 MG 24 hr tablet Take 25 mg by mouth daily.    . montelukast (SINGULAIR) 10 MG tablet Take 10 mg by mouth at bedtime.     . Multiple Vitamins-Iron (MULTIVITAMIN/IRON PO) Take 1 tablet by mouth daily with breakfast.     . pantoprazole (PROTONIX) 40 MG tablet Take 1 tablet (40 mg total) by mouth 2 (two) times daily. 180 tablet 1  . traMADol (ULTRAM) 50 MG tablet Take 50 mg every 8 (eight) hours as needed by mouth. Take 1/2 tablet  3  . prochlorperazine (COMPAZINE) 10 MG tablet Take 1 tablet (10 mg total) by mouth every 6 (six) hours as needed for nausea or vomiting. 30 tablet 0   Current Facility-Administered Medications  Medication Dose Route Frequency Provider Last Rate Last Dose  . 0.9 %  sodium chloride infusion   Intravenous Continuous Maryanna Shape, NP   Stopped at 03/21/18 1129   Facility-Administered Medications Ordered in Other Visits  Medication Dose Route Frequency Provider Last Rate Last Dose  . sodium chloride flush (NS) 0.9 % injection 10 mL  10 mL Intracatheter PRN Curt Bears, MD   10 mL at 03/21/18 1213    SURGICAL HISTORY:  Past Surgical History:  Procedure Laterality Date  . CARDIAC CATHETERIZATION  07/08/2008   normal coronaries  . CARPAL TUNNEL RELEASE Right 1988  . COLONOSCOPY N/A 02/11/2016   Procedure: COLONOSCOPY;  Surgeon: Carol Ada, MD;  Location: [facility];  Service: Endoscopy;  Laterality: N/A;  . DILATION AND CURETTAGE OF UTERUS    . ESOPHAGOGASTRODUODENOSCOPY N/A 02/08/2017   Procedure: ESOPHAGOGASTRODUODENOSCOPY (EGD);  Surgeon: Carol Ada, MD;  Location: [facility];  Service: Endoscopy;  Laterality: N/A;  . EYE SURGERY Bilateral   . H/O MET Test w/PFT  04/02/2012   low risk; peak VO2 77% predicted  . IR GENERIC HISTORICAL  09/12/2016    IR FLUORO GUIDE PORT INSERTION RIGHT 09/12/2016 [name], MD [facility]  . IR GENERIC HISTORICAL  09/12/2016   IR US GUIDE VASC ACCESS RIGHT 09/12/2016 [name], MD [facility]  . JOINT REPLACEMENT  2003   thumb rt  . KNEE ARTHROSCOPY  12   rt meniscus  . MEDIASTINOSCOPY N/A 10/30/2013   Procedure: MEDIASTINOSCOPY;  Surgeon: Melrose Nakayama, MD;  Location: [facility];  Service: Thoracic;  Laterality: N/A;  . ROTATOR CUFF REPAIR  2006   ? side  . THYROIDECTOMY  1973  . TRANSTHORACIC ECHOCARDIOGRAM  09/25/2012   EF 55-60%; mild LVH & mild concentric hypertrophy; mild AV regurg; RV systolic pressure increase consistent with mild pulm HTN  . VIDEO ASSISTED THORACOSCOPY (VATS)/ LOBECTOMY Right 11/13/2013   Procedure: VIDEO ASSISTED THORACOSCOPY (VATS) with mediastinal  biopsies;  Surgeon: Melrose Nakayama, MD;  Location: [facility];  Service: Thoracic;  Laterality: Right;  RIGHT VATS,mediastinal biopsies  . VIDEO BRONCHOSCOPY Bilateral 09/25/2013   Procedure: VIDEO BRONCHOSCOPY WITHOUT FLUORO;  Surgeon: Tanda Rockers, MD;  Location: [facility];  Service: Cardiopulmonary;  Laterality: Bilateral;  . VIDEO BRONCHOSCOPY WITH ENDOBRONCHIAL ULTRASOUND N/A 10/30/2013   Procedure: VIDEO BRONCHOSCOPY WITH ENDOBRONCHIAL ULTRASOUND;  Surgeon: Melrose Nakayama, MD;  Location: [facility];  Service: Thoracic;  Laterality: N/A;    REVIEW OF SYSTEMS:   Review of Systems  Constitutional: Negative for chills, fatigue, fever.  Positive for decreased appetite and weight loss.  HENT:   Negative for mouth sores, nosebleeds, sore throat and trouble swallowing.   Eyes: Negative for eye problems and icterus.  Respiratory: Negative for cough, hemoptysis, shortness of breath and wheezing.   Cardiovascular: Negative for chest pain and leg swelling.  Gastrointestinal: Negative for abdominal pain, constipation, diarrhea.  Positive for intermittent nausea and vomiting since her last visit.  Genitourinary:  Negative for bladder incontinence, difficulty urinating, dysuria, frequency and hematuria.   Musculoskeletal: Negative for back pain, gait problem, neck pain and neck stiffness.  Skin: Negative for itching and rash.  Neurological: Negative for dizziness, extremity weakness, gait problem, headaches, light-headedness and seizures.  Hematological: Negative for adenopathy. Does not bruise/bleed easily.  Psychiatric/Behavioral: Negative for confusion, depression and sleep disturbance. The patient is not nervous/anxious.     PHYSICAL EXAMINATION:  Blood pressure (!) 146/72, pulse 76, temperature 98.4 F (36.9 C), temperature source Oral, resp. rate 18, height _0  (1.473 m), weight 153 lb 14.4 oz (69.8 kg), SpO2 100 %.  ECOG PERFORMANCE STATUS: 1 - Symptomatic but completely ambulatory  Physical Exam  Constitutional: Oriented to person, place, and time and well-developed, well-nourished, and in no distress. No distress.  HENT:  Head: Normocephalic and atraumatic.  Mouth/Throat: Oropharynx is clear and moist. No oropharyngeal exudate.  Eyes: Conjunctivae are normal. Right eye exhibits no discharge. Left eye exhibits no discharge. No scleral icterus.  Neck: Normal range of motion. Neck supple.  Cardiovascular: Normal rate, regular rhythm, normal heart sounds and intact distal pulses.   Pulmonary/Chest: Effort normal and breath sounds normal. No respiratory distress. No wheezes. No rales.  Abdominal: Soft. Bowel sounds are normal. Exhibits no distension and no mass. There is no tenderness.  Musculoskeletal: Normal range of motion. Exhibits no edema.  Lymphadenopathy:    No cervical adenopathy.  Neurological: Alert and oriented to person, place, and time. Exhibits normal muscle tone. Gait normal. Coordination normal.  Skin: Skin is warm and dry. No rash noted. Not diaphoretic. No erythema. No pallor.  Psychiatric: Mood, memory and judgment normal.  Vitals reviewed.  LABORATORY DATA: Lab  Results  Component Value Date   WBC 9.9 03/21/2018   HGB 11.1 (L) 03/21/2018   HCT 33.8 (L) 03/21/2018   MCV 96.0 03/21/2018   PLT 275 03/21/2018      Chemistry      Component Value Date/Time   NA 141 03/21/2018 0843   NA 140 07/12/2017 1212   K 3.3 (L) 03/21/2018 0843   K 3.5 07/12/2017 1212   CL 100 03/21/2018 0843   CO2 29 03/21/2018 0843   CO2 27 07/12/2017 1212   BUN 19 03/21/2018 0843   BUN 27.5 (H) 07/12/2017 1212   CREATININE 1.01 (H) 03/21/2018 0843   CREATININE 1.0 07/12/2017 1212      Component Value Date/Time   CALCIUM 9.8 03/21/2018 0843   CALCIUM 10.0 07/12/2017 1212   ALKPHOS 129 (H) 03/21/2018 0843   ALKPHOS 119  07/12/2017 1212   AST 19 03/21/2018 0843   AST 16 07/12/2017 1212   ALT 18 03/21/2018 0843   ALT 15 07/12/2017 1212   BILITOT 0.5 03/21/2018 0843   BILITOT 0.48 07/12/2017 1212       RADIOGRAPHIC STUDIES:  No results found.   ASSESSMENT/PLAN:  Lung cancer, middle lobe ([place]) This is a very pleasant 77 year old white female with recurrent non-small cell lung cancer, squamous cell carcinoma. The patient completed 6 cycles of systemic chemotherapy with carboplatin and paclitaxel and tolerated her treatment well except for fatigue as well as peripheral neuropathy. The patient has been in observation.  She continued to have persistent dry cough and shortness of breath. She had evidence for disease progression on restaging scan. The patient was a started on treatment with second line immunotherapy with Nivolumab 480 mg IV every 4 weeks status post 2 cycles.    She has tolerated her treatment fairly well overall with the exception of intermittent nausea and vomiting and weight loss. Recommend for the patient to proceed with cycle 3 of her treatment today as scheduled.  She will have a restaging CT scan of the chest prior to her next visit.  She will follow-up in 4 weeks for evaluation prior to cycle 4 and to review her restaging CT scan  results.  Labs were reviewed and BUN and creatinine noted to be slightly elevated.  We will give her 500 cc of normal saline infusion today.  She was encouraged to increase her oral fluid intake.  For the nausea and vomiting, I have prescribed Compazine 10 mg every 6 hours as needed for nausea and vomiting.  She will let us know if this is not effective.  The patient's potassium level is slightly low.  I have advised her to increase her potassium from every other day to once a day.  For her weight loss, I have referred her to the dietitian.  I have encouraged her to restart nutritional supplements.  She was advised to call immediately if she has any concerning symptoms in the interval. The patient voices understanding of current disease status and treatment options and is in agreement with the current care plan. All questions were answered. The patient knows to call the clinic with any problems, questions or concerns. We can certainly see the patient much sooner if necessary.   Orders Placed This Encounter  Procedures  . CT CHEST W CONTRAST    Standing Status:   Future    Standing Expiration Date:   03/22/2019    Order Specific Question:   If indicated for the ordered procedure, I authorize the administration of contrast media per Radiology protocol    Answer:   Yes    Order Specific Question:   Preferred imaging location?    Answer:   [facility]    Order Specific Question:   Radiology Contrast Protocol - do NOT remove file path    Answer:   [email or web address]    Order Specific Question:   ** REASON FOR EXAM (FREE TEXT)    Answer:   Lung cancer. Resatging.  . Amb Referral to Nutrition and Diabetic E    Referral Priority:   Routine    Referral Type:   Consultation    Referral Reason:   Specialty Services Required    Number of Visits Requested:   [place], DNP, AGPCNP-BC, AOCNP 03/21/18

## 2018-03-21 ENCOUNTER — Inpatient Hospital Stay: Payer: Medicare Other | Admitting: Oncology

## 2018-03-21 ENCOUNTER — Other Ambulatory Visit: Payer: Self-pay

## 2018-03-21 ENCOUNTER — Encounter: Payer: Self-pay | Admitting: Oncology

## 2018-03-21 ENCOUNTER — Inpatient Hospital Stay: Payer: Medicare Other

## 2018-03-21 ENCOUNTER — Telehealth: Payer: Self-pay | Admitting: Oncology

## 2018-03-21 ENCOUNTER — Inpatient Hospital Stay: Payer: Medicare Other | Attending: Internal Medicine

## 2018-03-21 VITALS — BP 146/72 | HR 76 | Temp 98.4°F | Resp 18 | Ht <= 58 in | Wt 153.9 lb

## 2018-03-21 DIAGNOSIS — Z79899 Other long term (current) drug therapy: Secondary | ICD-10-CM | POA: Diagnosis not present

## 2018-03-21 DIAGNOSIS — C342 Malignant neoplasm of middle lobe, bronchus or lung: Secondary | ICD-10-CM | POA: Insufficient documentation

## 2018-03-21 DIAGNOSIS — R0602 Shortness of breath: Secondary | ICD-10-CM

## 2018-03-21 DIAGNOSIS — R634 Abnormal weight loss: Secondary | ICD-10-CM

## 2018-03-21 DIAGNOSIS — E860 Dehydration: Secondary | ICD-10-CM

## 2018-03-21 DIAGNOSIS — E876 Hypokalemia: Secondary | ICD-10-CM | POA: Insufficient documentation

## 2018-03-21 DIAGNOSIS — R630 Anorexia: Secondary | ICD-10-CM | POA: Insufficient documentation

## 2018-03-21 DIAGNOSIS — R7989 Other specified abnormal findings of blood chemistry: Secondary | ICD-10-CM

## 2018-03-21 DIAGNOSIS — G620 Drug-induced polyneuropathy: Secondary | ICD-10-CM | POA: Insufficient documentation

## 2018-03-21 DIAGNOSIS — Z5112 Encounter for antineoplastic immunotherapy: Secondary | ICD-10-CM

## 2018-03-21 DIAGNOSIS — R05 Cough: Secondary | ICD-10-CM

## 2018-03-21 DIAGNOSIS — C3411 Malignant neoplasm of upper lobe, right bronchus or lung: Secondary | ICD-10-CM

## 2018-03-21 DIAGNOSIS — R112 Nausea with vomiting, unspecified: Secondary | ICD-10-CM

## 2018-03-21 DIAGNOSIS — E46 Unspecified protein-calorie malnutrition: Secondary | ICD-10-CM

## 2018-03-21 LAB — CMP (CANCER CENTER ONLY)
ALT: 18 U/L (ref 0–44)
AST: 19 U/L (ref 15–41)
Albumin: 3.7 g/dL (ref 3.5–5.0)
Alkaline Phosphatase: 129 U/L — ABNORMAL HIGH (ref 38–126)
Anion gap: 12 (ref 5–15)
BUN: 19 mg/dL (ref 8–23)
CHLORIDE: 100 mmol/L (ref 98–111)
CO2: 29 mmol/L (ref 22–32)
Calcium: 9.8 mg/dL (ref 8.9–10.3)
Creatinine: 1.01 mg/dL — ABNORMAL HIGH (ref 0.44–1.00)
GFR, Estimated: 53 mL/min — ABNORMAL LOW (ref >60)
Glucose, Bld: 101 mg/dL — ABNORMAL HIGH (ref 70–99)
POTASSIUM: 3.3 mmol/L — AB (ref 3.5–5.1)
Sodium: 141 mmol/L (ref 135–145)
Total Bilirubin: 0.5 mg/dL (ref 0.3–1.2)
Total Protein: 7.1 g/dL (ref 6.5–8.1)

## 2018-03-21 LAB — CBC WITH DIFFERENTIAL (CANCER CENTER ONLY)
BASOS PCT: 1 %
Basophils Absolute: 0.1 10*3/uL (ref 0.0–0.1)
EOS ABS: 0.6 10*3/uL — AB (ref 0.0–0.5)
EOS PCT: 6 %
HEMATOCRIT: 33.8 % — AB (ref 34.8–46.6)
Hemoglobin: 11.1 g/dL — ABNORMAL LOW (ref 11.6–15.9)
Lymphocytes Relative: 13 %
Lymphs Abs: 1.3 10*3/uL (ref 0.9–3.3)
MCH: 31.5 pg (ref 25.1–34.0)
MCHC: 32.8 g/dL (ref 31.5–36.0)
MCV: 96 fL (ref 79.5–101.0)
MONO ABS: 0.6 10*3/uL (ref 0.1–0.9)
MONOS PCT: 6 %
NEUTROS ABS: 7.3 10*3/uL — AB (ref 1.5–6.5)
Neutrophils Relative %: 74 %
PLATELETS: 275 10*3/uL (ref 145–400)
RBC: 3.52 MIL/uL — ABNORMAL LOW (ref 3.70–5.45)
RDW: 12.7 % (ref 11.2–14.5)
WBC Count: 9.9 10*3/uL (ref 3.9–10.3)

## 2018-03-21 LAB — TSH: TSH: 0.734 u[IU]/mL (ref 0.308–3.960)

## 2018-03-21 MED ORDER — SODIUM CHLORIDE 0.9 % IV SOLN
INTRAVENOUS | Status: DC
  Administered 2018-03-21: 10:00:00 via INTRAVENOUS

## 2018-03-21 MED ORDER — HEPARIN SOD (PORK) LOCK FLUSH 100 UNIT/ML IV SOLN
500.0000 [IU] | Freq: Once | INTRAVENOUS | Status: AC | PRN
  Administered 2018-03-21: 500 [IU]
  Filled 2018-03-21: qty 5

## 2018-03-21 MED ORDER — PROCHLORPERAZINE MALEATE 10 MG PO TABS: 10.0000 mg | ORAL_TABLET | Freq: Four times a day (QID) | ORAL | 0 refills | Status: DC | PRN

## 2018-03-21 MED ORDER — SODIUM CHLORIDE 0.9% FLUSH
10.0000 mL | INTRAVENOUS | Status: DC | PRN
  Administered 2018-03-21: 10 mL
  Filled 2018-03-21: qty 10

## 2018-03-21 MED ORDER — SODIUM CHLORIDE 0.9 % IV SOLN
480.0000 mg | Freq: Once | INTRAVENOUS | Status: AC
  Administered 2018-03-21: 480 mg via INTRAVENOUS
  Filled 2018-03-21: qty 48

## 2018-03-21 MED ORDER — SODIUM CHLORIDE 0.9 % IV SOLN
Freq: Once | INTRAVENOUS | Status: AC
  Administered 2018-03-21: 11:00:00 via INTRAVENOUS

## 2018-03-21 NOTE — Patient Instructions (Signed)
[facility] Discharge Instructions for Patients Receiving Chemotherapy  Today you received the following chemotherapy agents: Nivolumab  To help prevent nausea and vomiting after your treatment, we encourage you to take your nausea medication as directed.    If you develop nausea and vomiting that is not controlled by your nausea medication, call the clinic.   BELOW ARE SYMPTOMS THAT SHOULD BE REPORTED IMMEDIATELY:  *FEVER GREATER THAN 100.5 F  *CHILLS WITH OR WITHOUT FEVER  NAUSEA AND VOMITING THAT IS NOT CONTROLLED WITH YOUR NAUSEA MEDICATION  *UNUSUAL SHORTNESS OF BREATH  *UNUSUAL BRUISING OR BLEEDING  TENDERNESS IN MOUTH AND THROAT WITH OR WITHOUT PRESENCE OF ULCERS  *URINARY PROBLEMS  *BOWEL PROBLEMS  UNUSUAL RASH Items with * indicate a potential emergency and should be followed up as soon as possible.  Feel free to call the clinic should you have any questions or concerns. The clinic phone number is [phone].  Please show the CHEMO ALERT CARD at check-in to the Emergency Department and triage nurse.   

## 2018-03-21 NOTE — Telephone Encounter (Signed)
Scheduled appt per 7/18 los - pt to get updated schedule in treatment area. - central radiology to contact patient with ct scan .

## 2018-03-25 ENCOUNTER — Other Ambulatory Visit: Payer: Self-pay | Admitting: Internal Medicine

## 2018-03-26 ENCOUNTER — Telehealth: Payer: Self-pay

## 2018-03-28 ENCOUNTER — Other Ambulatory Visit: Payer: Self-pay

## 2018-03-28 NOTE — Patient Outreach (Signed)
[facility]) Care Management  03/28/2018  MAEVEN MCDOUGALL May 25, 1941 [id number]   Telephone Screen Referral Date : 03/26/2018 Referral Source:EMMI Support Call  Referral Reason: EMMI Engagement Tool Insurance: [facility]   Outreach attempt # 1To patient. No answer. HIPAA compliant voicemail left with contact information.   Plan: RN Health Coach will send letter. RN Health Coach will make another attempt to the patient within four business days.  [name] RN, BSN, [place] Direct Dial:  [phone] Fax: [phone]

## 2018-03-28 NOTE — Patient Outreach (Signed)
[facility]) Care Management  [place]  03/28/2018   Kimberly [name] 28-Jul-1941 [id number]      Outreach attempt # 1 to the patient. Patient able to verify HIPAA.  Patient completed the screening and initial assessment.    Social: Patient lives alone in home but has her daughter who lives in another city but  is very supportive.  She is able to ambulate and perform her ADLS/IADLS independently. Patient drives herself to appointments. The durable medical equipment in the home includes  Walker, cane, blood pressure cuff, scale and reading glasses.  Conditions: per chart review and speaking with the patient her PMH includes Atrial fibrillation, Dyslipidemia, Hypertension, Hypothyroidism and Lung cancer.  The patient states that she does not check her blood pressure daily .  She checks it 2-3 times a week or if she feels "funny".  She states that her blood pressures range systolic between the 536'U to 140's.  She states that her physician is happy if she keeps it in that range. Her blood pressure was 140/60 but she could not remember the exact number for her diastolic. The patient reports that she has lung cancer that is inoperable.   She states that she is unable to exercise due to her breathing, but she tries to walk in her home or outside if it is cool.  Medications: The patient states that she is on eleven medications.  She is able to afford her medication and manage them.   Advanced Directives: The patient reports that she has a health care power of attorney and a living will.  Her physician has a copy of both.    Objective:   Encounter Medications:  Outpatient Encounter Medications as of 03/28/2018  Medication Sig Note  . albuterol (PROVENTIL HFA;VENTOLIN HFA) 108 (90 BASE) MCG/ACT inhaler Inhale 1 puff into the lungs every 6 (six) hours as needed for wheezing or shortness of breath.    [name] amLODipine (NORVASC) 5 MG tablet Take 1 tablet (5 mg total) by  mouth daily.   [name] atorvastatin (LIPITOR) 20 MG tablet Take 20 mg by mouth every morning.  03/23/2014: .  . chlorthalidone (HYGROTON) 25 MG tablet TAKE 1 TABLET BY MOUTH  DAILY   . KLOR-CON 8 MEQ tablet Take 8 mEq every other day by mouth.   . levothyroxine (SYNTHROID, LEVOTHROID) 100 MCG tablet Take 100 mcg daily by mouth.   . lidocaine-prilocaine (EMLA) cream Apply 1 application topically as needed. (Patient taking differently: Apply 1 application topically as needed (for port access). )   . metoprolol succinate (TOPROL-XL) 25 MG 24 hr tablet Take 25 mg by mouth daily.   . montelukast (SINGULAIR) 10 MG tablet Take 10 mg by mouth at bedtime.    . Multiple Vitamins-Iron (MULTIVITAMIN/IRON PO) Take 1 tablet by mouth daily with breakfast.    . pantoprazole (PROTONIX) 40 MG tablet Take 1 tablet (40 mg total) by mouth 2 (two) times daily.   . prochlorperazine (COMPAZINE) 10 MG tablet Take 1 tablet (10 mg total) by mouth every 6 (six) hours as needed for nausea or vomiting.   . benzonatate (TESSALON) 200 MG capsule Take 1 capsule (200 mg total) by mouth every 8 (eight) hours as needed for cough. (Patient not taking: Reported on 03/28/2018)   . traMADol (ULTRAM) 50 MG tablet Take 50 mg every 8 (eight) hours as needed by mouth. Take 1/2 tablet    No facility-administered encounter medications on file as of 03/28/2018.  Functional Status:  In your present state of health, do you have any difficulty performing the following activities: 03/28/2018  Hearing? N  Vision? N  Difficulty concentrating or making decisions? N  Walking or climbing stairs? Y  Comment repiratory issues  Dressing or bathing? N  Doing errands, shopping? N  Preparing Food and eating ? N  Using the Toilet? N  In the past six months, have you accidently leaked urine? N  Do you have problems with loss of bowel control? N  Managing your Medications? N  Managing your Finances? N  Housekeeping or managing your Housekeeping? N  Some  recent data might be hidden    Fall/Depression Screening: Fall Risk  03/28/2018 07/28/2014 04/27/2014  Falls in the past year? Yes No Yes  Number falls in past yr: 1 (No Data) 2 or more  Comment - has passed out x 2 due to heart issues -  Injury with Fall? No - -  Risk Factor Category  - - -  Risk for fall due to : - - Other (Comment)  Risk for fall due to: Comment - - passed out due to blood pressure   PHQ 2/9 Scores 03/28/2018  PHQ - 2 Score 1    Assessment:Patient will benefit from health coach outreach for disease management and support.   THN CM Care Plan Problem One     Most Recent Value  Care Plan Problem One  Knowledge deficit related to Hypertention  Role Documenting the Problem One  [place] for Problem One  Active  THN Long Term Goal   In 90 days the patient will verbalize that her blood pressure remians in the 140's  THN Long Term Goal Start Date  03/28/18  Interventions for Problem One Long Term Goal  discussed with the patient about diet,weight  and monitoring blood pressure  THN CM Short Term Goal #1   In 30 days the patient will monitor her blood pressure and record values daily  THN CM Short Term Goal #1 Start Date  03/28/18  Interventions for Short Term Goal #1  Sending the patient EMMI informtion on moniotring blood pressure  THN CM Short Term Goal #2   In 30 days the patient will verbalize monitoring salt in her diet  THN CM Short Term Goal #2 Start Date  03/28/18  Interventions for Short Term Goal #2  Sending EMMI information on salt reduction     Plan: [place] will provide ongoing education for patient on hypertension through phone calls and sending printed information to patient for further discussion.  RN Health Coach will send welcome packet with consent to patient as well as printed information on hypertension.  RN Health Coach will send initial barriers letter, assessment, and care plan to primary care physician.  [name] RN,  BSN, [place] Direct Dial:  [phone]  Fax: [phone]

## 2018-04-02 ENCOUNTER — Ambulatory Visit: Payer: Self-pay

## 2018-04-16 ENCOUNTER — Ambulatory Visit (HOSPITAL_COMMUNITY)
Admission: RE | Admit: 2018-04-16 | Discharge: 2018-04-16 | Disposition: A | Discharge status: Home / Self Care | Payer: Medicare Other | Source: Ambulatory Visit | Attending: Oncology | Admitting: Oncology

## 2018-04-16 ENCOUNTER — Encounter (HOSPITAL_COMMUNITY): Payer: Self-pay

## 2018-04-16 DIAGNOSIS — Z5112 Encounter for antineoplastic immunotherapy: Secondary | ICD-10-CM | POA: Diagnosis not present

## 2018-04-16 DIAGNOSIS — I700 Atherosclerosis of aorta: Secondary | ICD-10-CM | POA: Diagnosis not present

## 2018-04-16 DIAGNOSIS — N2889 Other specified disorders of kidney and ureter: Secondary | ICD-10-CM | POA: Diagnosis not present

## 2018-04-16 DIAGNOSIS — C342 Malignant neoplasm of middle lobe, bronchus or lung: Secondary | ICD-10-CM | POA: Insufficient documentation

## 2018-04-16 DIAGNOSIS — C3490 Malignant neoplasm of unspecified part of unspecified bronchus or lung: Secondary | ICD-10-CM | POA: Diagnosis not present

## 2018-04-16 MED ORDER — IOHEXOL 300 MG/ML  SOLN
75.0000 mL | Freq: Once | INTRAMUSCULAR | Status: AC | PRN
  Administered 2018-04-16: 75 mL via INTRAVENOUS

## 2018-04-16 MED ORDER — HEPARIN SOD (PORK) LOCK FLUSH 100 UNIT/ML IV SOLN
INTRAVENOUS | Status: AC
  Administered 2018-04-16: 500 [IU]
  Filled 2018-04-16: qty 5

## 2018-04-16 MED ORDER — HEPARIN SOD (PORK) LOCK FLUSH 100 UNIT/ML IV SOLN: 500.0000 [IU] | Freq: Once | INTRAVENOUS | Status: DC

## 2018-04-18 ENCOUNTER — Inpatient Hospital Stay: Payer: Medicare Other

## 2018-04-18 ENCOUNTER — Inpatient Hospital Stay: Payer: Medicare Other | Admitting: Nutrition

## 2018-04-18 ENCOUNTER — Inpatient Hospital Stay (HOSPITAL_BASED_OUTPATIENT_CLINIC_OR_DEPARTMENT_OTHER): Payer: Medicare Other | Admitting: Internal Medicine

## 2018-04-18 ENCOUNTER — Telehealth: Payer: Self-pay | Admitting: Internal Medicine

## 2018-04-18 ENCOUNTER — Encounter: Payer: Self-pay | Admitting: Internal Medicine

## 2018-04-18 ENCOUNTER — Inpatient Hospital Stay: Payer: Medicare Other | Attending: Internal Medicine

## 2018-04-18 VITALS — BP 125/86 | HR 64 | Temp 98.3°F | Resp 18 | Ht <= 58 in | Wt 153.8 lb

## 2018-04-18 DIAGNOSIS — C3490 Malignant neoplasm of unspecified part of unspecified bronchus or lung: Secondary | ICD-10-CM

## 2018-04-18 DIAGNOSIS — R110 Nausea: Secondary | ICD-10-CM

## 2018-04-18 DIAGNOSIS — R0602 Shortness of breath: Secondary | ICD-10-CM | POA: Insufficient documentation

## 2018-04-18 DIAGNOSIS — R05 Cough: Secondary | ICD-10-CM | POA: Insufficient documentation

## 2018-04-18 DIAGNOSIS — C342 Malignant neoplasm of middle lobe, bronchus or lung: Secondary | ICD-10-CM | POA: Diagnosis not present

## 2018-04-18 DIAGNOSIS — R5383 Other fatigue: Secondary | ICD-10-CM | POA: Diagnosis not present

## 2018-04-18 DIAGNOSIS — Z5112 Encounter for antineoplastic immunotherapy: Secondary | ICD-10-CM | POA: Insufficient documentation

## 2018-04-18 DIAGNOSIS — C3411 Malignant neoplasm of upper lobe, right bronchus or lung: Secondary | ICD-10-CM

## 2018-04-18 DIAGNOSIS — I10 Essential (primary) hypertension: Secondary | ICD-10-CM

## 2018-04-18 DIAGNOSIS — T451X5A Adverse effect of antineoplastic and immunosuppressive drugs, initial encounter: Secondary | ICD-10-CM

## 2018-04-18 DIAGNOSIS — D6481 Anemia due to antineoplastic chemotherapy: Secondary | ICD-10-CM

## 2018-04-18 DIAGNOSIS — Z79899 Other long term (current) drug therapy: Secondary | ICD-10-CM | POA: Insufficient documentation

## 2018-04-18 DIAGNOSIS — E860 Dehydration: Secondary | ICD-10-CM

## 2018-04-18 LAB — CMP (CANCER CENTER ONLY)
ALBUMIN: 3.4 g/dL — AB (ref 3.5–5.0)
ALK PHOS: 138 U/L — AB (ref 38–126)
ALT: 13 U/L (ref 0–44)
ANION GAP: 10 (ref 5–15)
AST: 15 U/L (ref 15–41)
BUN: 18 mg/dL (ref 8–23)
CO2: 27 mmol/L (ref 22–32)
Calcium: 9.2 mg/dL (ref 8.9–10.3)
Chloride: 101 mmol/L (ref 98–111)
Creatinine: 0.97 mg/dL (ref 0.44–1.00)
GFR, Est AFR Am: >60 mL/min (ref >60)
GFR, Estimated: 56 mL/min — ABNORMAL LOW (ref >60)
GLUCOSE: 102 mg/dL — AB (ref 70–99)
POTASSIUM: 3.9 mmol/L (ref 3.5–5.1)
SODIUM: 138 mmol/L (ref 135–145)
Total Bilirubin: 0.5 mg/dL (ref 0.3–1.2)
Total Protein: 6.8 g/dL (ref 6.5–8.1)

## 2018-04-18 LAB — CBC WITH DIFFERENTIAL (CANCER CENTER ONLY)
Basophils Absolute: 0.1 10*3/uL (ref 0.0–0.1)
Basophils Relative: 1 %
Eosinophils Absolute: 1.1 10*3/uL — ABNORMAL HIGH (ref 0.0–0.5)
Eosinophils Relative: 13 %
HCT: 31.1 % — ABNORMAL LOW (ref 34.8–46.6)
Hemoglobin: 10.7 g/dL — ABNORMAL LOW (ref 11.6–15.9)
LYMPHS PCT: 13 %
Lymphs Abs: 1.1 10*3/uL (ref 0.9–3.3)
MCH: 32.7 pg (ref 25.1–34.0)
MCHC: 34.5 g/dL (ref 31.5–36.0)
MCV: 94.8 fL (ref 79.5–101.0)
MONOS PCT: 7 %
Monocytes Absolute: 0.6 10*3/uL (ref 0.1–0.9)
NEUTROS ABS: 5.4 10*3/uL (ref 1.5–6.5)
Neutrophils Relative %: 66 %
Platelet Count: 282 10*3/uL (ref 145–400)
RBC: 3.28 MIL/uL — ABNORMAL LOW (ref 3.70–5.45)
RDW: 13.1 % (ref 11.2–14.5)
WBC Count: 8.2 10*3/uL (ref 3.9–10.3)

## 2018-04-18 LAB — TSH: TSH: 0.719 u[IU]/mL (ref 0.308–3.960)

## 2018-04-18 MED ORDER — NIVOLUMAB CHEMO INJECTION 100 MG/10ML
480.0000 mg | Freq: Once | INTRAVENOUS | Status: AC
  Administered 2018-04-18: 480 mg via INTRAVENOUS
  Filled 2018-04-18: qty 48

## 2018-04-18 MED ORDER — HEPARIN SOD (PORK) LOCK FLUSH 100 UNIT/ML IV SOLN
500.0000 [IU] | Freq: Once | INTRAVENOUS | Status: AC | PRN
  Administered 2018-04-18: 500 [IU]
  Filled 2018-04-18: qty 5

## 2018-04-18 MED ORDER — SODIUM CHLORIDE 0.9% FLUSH
10.0000 mL | INTRAVENOUS | Status: DC | PRN
  Administered 2018-04-18: 10 mL
  Filled 2018-04-18: qty 10

## 2018-04-18 MED ORDER — SODIUM CHLORIDE 0.9 % IV SOLN
Freq: Once | INTRAVENOUS | Status: AC
  Administered 2018-04-18: 10:00:00 via INTRAVENOUS
  Filled 2018-04-18: qty 250

## 2018-04-18 NOTE — Progress Notes (Signed)
77 year old female diagnosed with recurrent lung cancer.  She is a patient of Dr. Julien Nordmann.  She is receiving cycle 4 of nivolumab today.  Past medical history includes hypothyroidism, hypertension, GERD, and atrial fibrillation.  Medications include Lipitor, Synthroid, multivitamin, Protonix, and Compazine.  Labs include potassium 3.3 and glucose 101  Height: 4 feet 10 inches. Weight: 153.8 pounds August 15. Usual body weight: 163 pounds in February 2019. BMI: 32.14.  Patient reports her nausea and vomiting have improved.  She has been taking Compazine once a day. She complains of poor appetite. She is pleased that she has maintained her weight since last visit. Patient reports eating 2 meals a day but generally picking healthy foods. Dietary recall reveals patient does not consume enough protein. 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  Nutrition diagnosis: Food and nutrition related knowledge deficit related to recurrent lung cancer as evidenced by no prior need for nutrition related information.  Intervention: Educated patient on the importance of increasing protein and encouraged her to consume snacks between meals. Reviewed high-protein foods.  Suggested options that were not dairy. Educated patient for weight maintenance. Provided fact sheet on ways to increase protein. Questions were answered.  Teach back method used.  Contact information given.  Monitoring, evaluation, goals: Patient will tolerate adequate calories and protein for weight maintenance.  Next visit: Thursday, September 12 during infusion.  **Disclaimer: This note was dictated with voice recognition software. Similar sounding words can inadvertently be transcribed and this note may contain transcription errors which may not have been corrected upon publication of note.**

## 2018-04-18 NOTE — Progress Notes (Signed)
[place] [phone]   Fax:([phone]) [place], MD [address] [place] [address]  DIAGNOSIS: Recurrent non-small cell lung cancer, squamous cell carcinoma initially diagnosed as unresectable a stage IIIa in February 2015.  PRIOR THERAPY: 1) Status post exploratory right VATS with mediastinal biopsy under the care of Dr. Roxan Hockey on 11/13/2013. The tumor was found to be unresectable at that time.[name] 2) Concurrent chemoradiation with weekly carboplatin for AUC of 2 and paclitaxel 45 mg/M2, status post 6 cycles with partial response. First cycle was given on 12/01/2013. 3) Consolidation chemotherapy with carboplatin for AUC of 5 and paclitaxel 175 mg/M2 every 3 weeks with Neulasta support. First dose 02/23/2014. Status post 3 cycles with partial response. 4) Systemic chemotherapy with carboplatin for AUC of 5 and paclitaxel 175 MG/M2 every 3 weeks with Neulasta support. Status post 6 cycles. First dose was given on 08/14/2016.  Last dose was giving 12/11/2016 with partial response.  CURRENT THERAPY: Second line treatment with immunotherapy with Nivolumab 480 mg IV every 4 weeks, first dose Jan 25, 2018.  Status post 3 cycles.  INTERVAL HISTORY: Kimberly [name] 77 y.o. female returns to the clinic today for follow-up visit accompanied by her daughter.  The patient is feeling fine today with no specific complaints except for intermittent nausea.  She is currently on Compazine.  She also has a productive cough of whitish sputum.  She has no chest pain, shortness breath or hemoptysis.  She denied having any fever or chills.  She has no diarrhea or constipation.  She denied having any skin rash.  She has no recent weight loss or night sweats.  She continues to tolerate her treatment with Nivolumab fairly well.  She is here for evaluation after repeating CT scan of the chest for restaging of her disease.   MEDICAL  HISTORY: Past Medical History:  Diagnosis Date  . Allergic asthma without acute exacerbation or status asthmaticus   . Aortic insufficiency   . Arthritis   . Atrial fibrillation ([place])   . Back pain 08/14/2016  . Bronchitis   . Cough    dry, endobronchial mass  . Dyslipidemia   . Family history of adverse reaction to anesthesia    pts son also experiences N/V  . GERD (gastroesophageal reflux disease)   . Heart murmur   . History of blood transfusion   . History of bronchitis   . History of chemotherapy   . History of nuclear stress test 02/26/2006   exercise myoview; normal pattern of perfusion; low risk scan   . Hypertension   . Hypothyroidism   . Mitral insufficiency   . Pneumonia   . PONV (postoperative nausea and vomiting)   . Radiation 12/01/13-01/08/14   50.4 gray to right central chest  . Renal mass, left 04/12/2017  . Shortness of breath    with exertion  . Squamous cell carcinoma of lung ([place])   . Syncope    "states she has passed out a few times. dr is trying to find cause.last time when geeting ready to go home after video bronch/bx  . Thyroid disease     ALLERGIES:  is allergic to lactose intolerance (gi); amiodarone; augmentin [amoxicillin-pot clavulanate]; and milk-related compounds.  MEDICATIONS:  Current Outpatient Medications  Medication Sig Dispense Refill  . albuterol (PROVENTIL HFA;VENTOLIN HFA) 108 (90 BASE) MCG/ACT inhaler Inhale 1 puff into the lungs every 6 (six) hours as needed for  wheezing or shortness of breath.     [name] amLODipine (NORVASC) 5 MG tablet Take 1 tablet (5 mg total) by mouth daily. 90 tablet 3  . atorvastatin (LIPITOR) 20 MG tablet Take 20 mg by mouth every morning.     . benzonatate (TESSALON) 200 MG capsule Take 1 capsule (200 mg total) by mouth every 8 (eight) hours as needed for cough. (Patient not taking: Reported on 03/28/2018) 45 capsule 2  . chlorthalidone (HYGROTON) 25 MG tablet TAKE 1 TABLET BY MOUTH  DAILY 90 tablet 0  . KLOR-CON  8 MEQ tablet Take 8 mEq every other day by mouth.  3  . levothyroxine (SYNTHROID, LEVOTHROID) 100 MCG tablet Take 100 mcg daily by mouth.    . lidocaine-prilocaine (EMLA) cream Apply 1 application topically as needed. (Patient taking differently: Apply 1 application topically as needed (for port access). ) 30 g 0  . metoprolol succinate (TOPROL-XL) 25 MG 24 hr tablet Take 25 mg by mouth daily.    . montelukast (SINGULAIR) 10 MG tablet Take 10 mg by mouth at bedtime.     . Multiple Vitamins-Iron (MULTIVITAMIN/IRON PO) Take 1 tablet by mouth daily with breakfast.     . pantoprazole (PROTONIX) 40 MG tablet Take 1 tablet (40 mg total) by mouth 2 (two) times daily. 180 tablet 1  . prochlorperazine (COMPAZINE) 10 MG tablet Take 1 tablet (10 mg total) by mouth every 6 (six) hours as needed for nausea or vomiting. 30 tablet 0  . traMADol (ULTRAM) 50 MG tablet Take 50 mg every 8 (eight) hours as needed by mouth. Take 1/2 tablet  3   No current facility-administered medications for this visit.     SURGICAL HISTORY:  Past Surgical History:  Procedure Laterality Date  . CARDIAC CATHETERIZATION  07/08/2008   normal coronaries  . CARPAL TUNNEL RELEASE Right 1988  . COLONOSCOPY N/A 02/11/2016   Procedure: COLONOSCOPY;  Surgeon: Carol Ada, MD;  Location: [facility];  Service: Endoscopy;  Laterality: N/A;  . DILATION AND CURETTAGE OF UTERUS    . ESOPHAGOGASTRODUODENOSCOPY N/A 02/08/2017   Procedure: ESOPHAGOGASTRODUODENOSCOPY (EGD);  Surgeon: Carol Ada, MD;  Location: [facility];  Service: Endoscopy;  Laterality: N/A;  . EYE SURGERY Bilateral   . H/O MET Test w/PFT  04/02/2012   low risk; peak VO2 77% predicted  . IR GENERIC HISTORICAL  09/12/2016   IR FLUORO GUIDE PORT INSERTION RIGHT 09/12/2016 [name], MD [facility]  . IR GENERIC HISTORICAL  09/12/2016   IR US GUIDE VASC ACCESS RIGHT 09/12/2016 [name], MD [facility]  . JOINT REPLACEMENT  2003   thumb rt  . KNEE ARTHROSCOPY   12   rt meniscus  . MEDIASTINOSCOPY N/A 10/30/2013   Procedure: MEDIASTINOSCOPY;  Surgeon: Melrose Nakayama, MD;  Location: [facility];  Service: Thoracic;  Laterality: N/A;  . ROTATOR CUFF REPAIR  2006   ? side  . THYROIDECTOMY  1973  . TRANSTHORACIC ECHOCARDIOGRAM  09/25/2012   EF 55-60%; mild LVH & mild concentric hypertrophy; mild AV regurg; RV systolic pressure increase consistent with mild pulm HTN  . VIDEO ASSISTED THORACOSCOPY (VATS)/ LOBECTOMY Right 11/13/2013   Procedure: VIDEO ASSISTED THORACOSCOPY (VATS) with mediastinal  biopsies;  Surgeon: Melrose Nakayama, MD;  Location: [facility];  Service: Thoracic;  Laterality: Right;  RIGHT VATS,mediastinal biopsies  . VIDEO BRONCHOSCOPY Bilateral 09/25/2013   Procedure: VIDEO BRONCHOSCOPY WITHOUT FLUORO;  Surgeon: Tanda Rockers, MD;  Location: [facility];  Service: Cardiopulmonary;  Laterality: Bilateral;  .  VIDEO BRONCHOSCOPY WITH ENDOBRONCHIAL ULTRASOUND N/A 10/30/2013   Procedure: VIDEO BRONCHOSCOPY WITH ENDOBRONCHIAL ULTRASOUND;  Surgeon: Melrose Nakayama, MD;  Location: [facility];  Service: Thoracic;  Laterality: N/A;    REVIEW OF SYSTEMS:  Constitutional: positive for fatigue Eyes: negative Ears, nose, mouth, throat, and face: negative Respiratory: positive for cough and sputum Cardiovascular: negative Gastrointestinal: negative Genitourinary:negative Integument/breast: negative Hematologic/lymphatic: negative Musculoskeletal:negative Neurological: negative Behavioral/Psych: negative Endocrine: negative Allergic/Immunologic: negative   PHYSICAL EXAMINATION: General appearance: alert, cooperative and no distress Head: Normocephalic, without obvious abnormality, atraumatic Neck: no adenopathy, no JVD, supple, symmetrical, trachea midline and thyroid not enlarged, symmetric, no tenderness/mass/nodules Lymph nodes: Cervical, supraclavicular, and axillary nodes normal. Resp: clear to auscultation bilaterally Back: symmetric, no  curvature. ROM normal. No CVA tenderness. Cardio: regular rate and rhythm, S1, S2 normal, no murmur, click, rub or gallop GI: soft, non-tender; bowel sounds normal; no masses,  no organomegaly Extremities: extremities normal, atraumatic, no cyanosis or edema Neurologic: Alert and oriented X 3, normal strength and tone. Normal symmetric reflexes. Normal coordination and gait  ECOG PERFORMANCE STATUS: 1 - Symptomatic but completely ambulatory  Blood pressure 125/86, pulse 64, temperature 98.3 F (36.8 C), temperature source Oral, resp. rate 18, height '4\' 10"'  (1.473 m), weight 153 lb 12.8 oz (69.8 kg), SpO2 97 %.  LABORATORY DATA: Lab Results  Component Value Date   WBC 8.2 04/18/2018   HGB 10.7 (L) 04/18/2018   HCT 31.1 (L) 04/18/2018   MCV 94.8 04/18/2018   PLT 282 04/18/2018      Chemistry      Component Value Date/Time   NA 141 03/21/2018 0843   NA 140 07/12/2017 1212   K 3.3 (L) 03/21/2018 0843   K 3.5 07/12/2017 1212   CL 100 03/21/2018 0843   CO2 29 03/21/2018 0843   CO2 27 07/12/2017 1212   BUN 19 03/21/2018 0843   BUN 27.5 (H) 07/12/2017 1212   CREATININE 1.01 (H) 03/21/2018 0843   CREATININE 1.0 07/12/2017 1212      Component Value Date/Time   CALCIUM 9.8 03/21/2018 0843   CALCIUM 10.0 07/12/2017 1212   ALKPHOS 129 (H) 03/21/2018 0843   ALKPHOS 119 07/12/2017 1212   AST 19 03/21/2018 0843   AST 16 07/12/2017 1212   ALT 18 03/21/2018 0843   ALT 15 07/12/2017 1212   BILITOT 0.5 03/21/2018 0843   BILITOT 0.48 07/12/2017 1212       RADIOGRAPHIC STUDIES: Ct Chest W Contrast  Result Date: 04/16/2018 CLINICAL DATA:  Lung cancer, status post chemotherapy/XRT, ongoing immunotherapy, chronic shortness of breath EXAM: CT CHEST WITH CONTRAST TECHNIQUE: Multidetector CT imaging of the chest was performed during intravenous contrast administration. CONTRAST:  89m OMNIPAQUE IOHEXOL 300 MG/ML  SOLN COMPARISON:  01/15/2018 FINDINGS: Cardiovascular: The heart is normal in  size. No pericardial effusion. No evidence of thoracic aortic aneurysm. Mild Atherosclerotic calcifications of the aortic arch. Three vessel coronary atherosclerosis. Right chest port terminates in the lower SVC. Mediastinum/Nodes: No suspicious mediastinal lymphadenopathy. Status post thyroidectomy. Lungs/Pleura: Radiation changes in the posterior right upper lobe/perihilar region. Stable right middle lobe masslike opacity measuring 3.1 x 3.9 cm, previously 3.2 x 3.8 cm when measured in a similar fashion, unchanged. New patchy/nodular opacities inferiorly in the left lower lobe and right lung base (series 5/image 107), possibly reflecting mild infection. Calcified left lower lobe granulomata. No pleural effusion or pneumothorax. Upper Abdomen: Visualized upper abdomen is notable for a 14 mm cystic/solid posterior interpolar right renal lesion with enhancement (series 2/image  139), worrisome for renal cell carcinoma. Musculoskeletal: Mild degenerative changes of the mid/lower thoracic spine. IMPRESSION: Stable 3.1 x 3.9 cm right middle lobe masslike opacity, grossly unchanged. Associated radiation changes in the right lung. New patchy/nodular opacities inferiorly in the left lower lobe and the right lung base, possibly reflecting mild infection. 14 mm cystic/solid nodule in the posterior right kidney, worrisome for renal cell carcinoma, grossly unchanged. Aortic Atherosclerosis (ICD10-I70.0). Electronically Signed   By: Julian Hy M.D.   On: 04/16/2018 14:53    ASSESSMENT AND PLAN: This is a very pleasant 77 years old white female with recurrent non-small cell lung cancer, squamous cell carcinoma. The patient completed 6 cycles of systemic chemotherapy with carboplatin and paclitaxel and tolerated her treatment well except for fatigue as well as peripheral neuropathy. The patient has been in observation.  She continues to have persistent dry cough and shortness of breath. She had evidence for disease  progression on restaging scan. The patient was a started on treatment with second line immunotherapy with Nivolumab 480 mg IV every 4 weeks status post 3 cycles.  The patient continues to tolerate her treatment well with no concerning complaints except for mild fatigue. She had repeat CT scan of the chest performed recently. I personally and independently reviewed the scans and discussed the results with the patient and her daughter today.  Her scan showed no concerning findings for disease progression. I recommended for the patient to continue on nivolumab and she will proceed with cycle #4 today. I will see her back for follow-up visit in 4 weeks for evaluation before starting cycle #5. For the nausea, she will continue her current treatment with Compazine on as-needed basis. She was advised to call immediately if she has any concerning symptoms in the interval. The patient voices understanding of current disease status and treatment options and is in agreement with the current care plan. All questions were answered. The patient knows to call the clinic with any problems, questions or concerns. We can certainly see the patient much sooner if necessary.   Disclaimer: This note was dictated with voice recognition software. Similar sounding words can inadvertently be transcribed and may not be corrected upon review.

## 2018-04-18 NOTE — Patient Instructions (Signed)
[facility] Discharge Instructions for Patients Receiving Chemotherapy  Today you received the following chemotherapy agents nivolumab (Opdivo)  To help prevent nausea and vomiting after your treatment, we encourage you to take your nausea medication as directed by your doctor.   If you develop nausea and vomiting that is not controlled by your nausea medication, call the clinic.   BELOW ARE SYMPTOMS THAT SHOULD BE REPORTED IMMEDIATELY:  *FEVER GREATER THAN 100.5 F  *CHILLS WITH OR WITHOUT FEVER  NAUSEA AND VOMITING THAT IS NOT CONTROLLED WITH YOUR NAUSEA MEDICATION  *UNUSUAL SHORTNESS OF BREATH  *UNUSUAL BRUISING OR BLEEDING  TENDERNESS IN MOUTH AND THROAT WITH OR WITHOUT PRESENCE OF ULCERS  *URINARY PROBLEMS  *BOWEL PROBLEMS  UNUSUAL RASH Items with * indicate a potential emergency and should be followed up as soon as possible.  Feel free to call the clinic should you have any questions or concerns. The clinic phone number is [phone].  Please show the [place]  at check-in to the Emergency Department and triage nurse.  Nivolumab injection What is this medicine? NIVOLUMAB (nye VOL ue mab) is a monoclonal antibody. It is used to treat melanoma, lung cancer, kidney cancer, head and neck cancer, Hodgkin lymphoma, urothelial cancer, colon cancer, and liver cancer. This medicine may be used for other purposes; ask your health care provider or pharmacist if you have questions. COMMON BRAND NAME(S): Opdivo What should I tell my health care provider before I take this medicine? They need to know if you have any of these conditions: -diabetes -immune system problems -kidney disease -liver disease -lung disease -organ transplant -stomach or intestine problems -thyroid disease -an unusual or allergic reaction to nivolumab, other medicines, foods, dyes, or preservatives -pregnant or trying to get pregnant -breast-feeding How should I use this  medicine? This medicine is for infusion into a vein. It is given by a health care professional in a hospital or clinic setting. A special MedGuide will be given to you before each treatment. Be sure to read this information carefully each time. Talk to your pediatrician regarding the use of this medicine in children. While this drug may be prescribed for children as young as 12 years for selected conditions, precautions do apply. Overdosage: If you think you have taken too much of this medicine contact a poison control center or emergency room at once. NOTE: This medicine is only for you. Do not share this medicine with others. What if I miss a dose? It is important not to miss your dose. Call your doctor or health care professional if you are unable to keep an appointment. What may interact with this medicine? Interactions have not been studied. Give your health care provider a list of all the medicines, herbs, non-prescription drugs, or dietary supplements you use. Also tell them if you smoke, drink alcohol, or use illegal drugs. Some items may interact with your medicine. This list may not describe all possible interactions. Give your health care provider a list of all the medicines, herbs, non-prescription drugs, or dietary supplements you use. Also tell them if you smoke, drink alcohol, or use illegal drugs. Some items may interact with your medicine. What should I watch for while using this medicine? This drug may make you feel generally unwell. Continue your course of treatment even though you feel ill unless your doctor tells you to stop. You may need blood work done while you are taking this medicine. Do not become pregnant while taking this medicine or for  5 months after stopping it. Women should inform their doctor if they wish to become pregnant or think they might be pregnant. There is a potential for serious side effects to an unborn child. Talk to your health care professional or  pharmacist for more information. Do not breast-feed an infant while taking this medicine. What side effects may I notice from receiving this medicine? Side effects that you should report to your doctor or health care professional as soon as possible: -allergic reactions like skin rash, itching or hives, swelling of the face, lips, or tongue -black, tarry stools -blood in the urine -bloody or watery diarrhea -changes in vision -change in sex drive -changes in emotions or moods -chest pain -confusion -cough -decreased appetite -diarrhea -facial flushing -feeling faint or lightheaded -fever, chills -hair loss -hallucination, loss of contact with reality -headache -irritable -joint pain -loss of memory -muscle pain -muscle weakness -seizures -shortness of breath -signs and symptoms of high blood sugar such as dizziness; dry mouth; dry skin; fruity breath; nausea; stomach pain; increased hunger or thirst; increased urination -signs and symptoms of kidney injury like trouble passing urine or change in the amount of urine -signs and symptoms of liver injury like dark yellow or brown urine; general ill feeling or flu-like symptoms; light-colored stools; loss of appetite; nausea; right upper belly pain; unusually weak or tired; yellowing of the eyes or skin -stiff neck -swelling of the ankles, feet, hands -weight gain Side effects that usually do not require medical attention (report to your doctor or health care professional if they continue or are bothersome): -bone pain -constipation -tiredness -vomiting This list may not describe all possible side effects. Call your doctor for medical advice about side effects. You may report side effects to FDA at 1-800-FDA-1088. Where should I keep my medicine? This drug is given in a hospital or clinic and will not be stored at home. NOTE: This sheet is a summary. It may not cover all possible information. If you have questions about this  medicine, talk to your doctor, pharmacist, or health care provider.  2018 Elsevier/Gold Standard (2016-05-29 17:49:34)

## 2018-04-18 NOTE — Telephone Encounter (Signed)
Scheduled appt per 8/15 los - gave patient aVS and calender per los.

## 2018-04-19 ENCOUNTER — Other Ambulatory Visit: Payer: Self-pay | Admitting: Medical Oncology

## 2018-04-19 DIAGNOSIS — R112 Nausea with vomiting, unspecified: Secondary | ICD-10-CM

## 2018-04-19 MED ORDER — PROCHLORPERAZINE MALEATE 10 MG PO TABS: 10.0000 mg | ORAL_TABLET | Freq: Four times a day (QID) | ORAL | 0 refills | Status: DC | PRN

## 2018-04-29 ENCOUNTER — Other Ambulatory Visit: Payer: Self-pay

## 2018-04-29 NOTE — Patient Outreach (Signed)
[facility]) Care Management  04/29/2018  Kimberly [name] 07-16-1941 [id number]    1st outreach attempt to the patient for assessment. No answer. HIPAA compliant voicemail left with contact information.  Plan: RN Health Coach will send letter. RN Health Coach will make another attempt to the patient within four business days.   [name] RN, BSN, [place] Direct Dial:  [phone]  Fax: [phone]

## 2018-05-03 ENCOUNTER — Other Ambulatory Visit: Payer: Self-pay

## 2018-05-03 NOTE — Patient Outreach (Signed)
[facility]) Care Management  05/03/2018   Kimberly [name] Sep 21, 1940 [id number]  Subjective: Telephone call to the patient for assessment. HIPAA verified.  The patient states that she is doing well.  Her blood pressure has been in the 130/60's.  She is very happy about this.  She states that she is cutting the salt out of her diet. She states that she is taking her medications as prescribed.  She has started taking an over the counter cough medication at bedtime as needed. She states that sometimes at night she will start to cough and unable to sleep. She denies any pain or falls. The patient states that she sees her oncologist every month and will see Dr Shelia Media in October.   Current Medications:  Current Outpatient Medications  Medication Sig Dispense Refill  . albuterol (PROVENTIL HFA;VENTOLIN HFA) 108 (90 BASE) MCG/ACT inhaler Inhale 1 puff into the lungs every 6 (six) hours as needed for wheezing or shortness of breath.     [name] atorvastatin (LIPITOR) 20 MG tablet Take 20 mg by mouth every morning.     . chlorthalidone (HYGROTON) 25 MG tablet TAKE 1 TABLET BY MOUTH  DAILY 90 tablet 0  . KLOR-CON 8 MEQ tablet Take 8 mEq every other day by mouth.  3  . levothyroxine (SYNTHROID, LEVOTHROID) 100 MCG tablet Take 100 mcg daily by mouth.    . lidocaine-prilocaine (EMLA) cream Apply 1 application topically as needed. (Patient taking differently: Apply 1 application topically as needed (for port access). ) 30 g 0  . metoprolol succinate (TOPROL-XL) 25 MG 24 hr tablet Take 25 mg by mouth daily.    . montelukast (SINGULAIR) 10 MG tablet Take 10 mg by mouth at bedtime.     . Multiple Vitamins-Iron (MULTIVITAMIN/IRON PO) Take 1 tablet by mouth daily with breakfast.     . pantoprazole (PROTONIX) 40 MG tablet Take 1 tablet (40 mg total) by mouth 2 (two) times daily. 180 tablet 1  . prochlorperazine (COMPAZINE) 10 MG tablet Take 1 tablet (10 mg total) by mouth every 6 (six) hours as needed  for nausea or vomiting. 30 tablet 0  . traMADol (ULTRAM) 50 MG tablet Take 50 mg every 8 (eight) hours as needed by mouth. Take 1/2 tablet  3  . amLODipine (NORVASC) 5 MG tablet Take 1 tablet (5 mg total) by mouth daily. 90 tablet 3  . benzonatate (TESSALON) 200 MG capsule Take 1 capsule (200 mg total) by mouth every 8 (eight) hours as needed for cough. (Patient not taking: Reported on 03/28/2018) 45 capsule 2   No current facility-administered medications for this visit.     Functional Status:  In your present state of health, do you have any difficulty performing the following activities: 03/28/2018  Hearing? N  Vision? N  Difficulty concentrating or making decisions? N  Walking or climbing stairs? Y  Comment repiratory issues  Dressing or bathing? N  Doing errands, shopping? N  Preparing Food and eating ? N  Using the Toilet? N  In the past six months, have you accidently leaked urine? N  Do you have problems with loss of bowel control? N  Managing your Medications? N  Managing your Finances? N  Housekeeping or managing your Housekeeping? N  Some recent data might be hidden    Fall/Depression Screening: Fall Risk  03/28/2018 07/28/2014 04/27/2014  Falls in the past year? Yes No Yes  Number falls in past yr: 1 (No Data) 2 or more  Comment -  has passed out x 2 due to heart issues -  Injury with Fall? No - -  Risk Factor Category  - - -  Risk for fall due to : - - Other (Comment)  Risk for fall due to: Comment - - passed out due to blood pressure   PHQ 2/9 Scores 03/28/2018  PHQ - 2 Score 1    Assessment: Patient will continue to benefit from health coach outreach for disease management and support.  [facility] CM Care Plan Problem One     Most Recent Value  [facility]N Long Term Goal   In 90 days the patient will verbalize that her blood pressure remians in the 140's  THN Long Term Goal Start Date  05/03/18  Interventions for Problem One Long Term Goal  Discussed with the patient bp  readings, checking her blood pressures and diet     Plan: [place] will contact patient in the month of October and patient agrees to next outreach.  [name] RN, BSN, [place] Direct Dial:  [phone]  Fax: [phone]

## 2018-05-07 ENCOUNTER — Telehealth: Payer: Self-pay | Admitting: Medical Oncology

## 2018-05-07 NOTE — Telephone Encounter (Signed)
Diarrhea-x 4 days-daily watery stools 3-4 episodes a day except today only 1 episode and loose. She has been taking lomotil. She feels bad cough worse at night not eating well. "Rotten headache today " -ibuprophen helped. Denies fever. Last opdivo 2.5 weeks ago. I instructed pt to take Imodium -AD . [facility] requested for tomorrow.

## 2018-05-08 ENCOUNTER — Inpatient Hospital Stay: Payer: Medicare Other | Attending: Internal Medicine

## 2018-05-08 ENCOUNTER — Inpatient Hospital Stay (HOSPITAL_BASED_OUTPATIENT_CLINIC_OR_DEPARTMENT_OTHER): Payer: Medicare Other | Admitting: Medical

## 2018-05-08 ENCOUNTER — Inpatient Hospital Stay: Payer: Medicare Other

## 2018-05-08 VITALS — BP 137/54 | HR 65 | Temp 98.5°F | Resp 17

## 2018-05-08 DIAGNOSIS — R197 Diarrhea, unspecified: Secondary | ICD-10-CM | POA: Insufficient documentation

## 2018-05-08 DIAGNOSIS — Z87891 Personal history of nicotine dependence: Secondary | ICD-10-CM

## 2018-05-08 DIAGNOSIS — J0110 Acute frontal sinusitis, unspecified: Secondary | ICD-10-CM | POA: Diagnosis not present

## 2018-05-08 DIAGNOSIS — Z452 Encounter for adjustment and management of vascular access device: Secondary | ICD-10-CM | POA: Diagnosis not present

## 2018-05-08 DIAGNOSIS — E860 Dehydration: Secondary | ICD-10-CM

## 2018-05-08 DIAGNOSIS — Z79899 Other long term (current) drug therapy: Secondary | ICD-10-CM | POA: Diagnosis not present

## 2018-05-08 DIAGNOSIS — C3490 Malignant neoplasm of unspecified part of unspecified bronchus or lung: Secondary | ICD-10-CM | POA: Diagnosis not present

## 2018-05-08 DIAGNOSIS — Z5112 Encounter for antineoplastic immunotherapy: Secondary | ICD-10-CM | POA: Insufficient documentation

## 2018-05-08 DIAGNOSIS — C342 Malignant neoplasm of middle lobe, bronchus or lung: Secondary | ICD-10-CM | POA: Diagnosis not present

## 2018-05-08 DIAGNOSIS — R05 Cough: Secondary | ICD-10-CM | POA: Diagnosis not present

## 2018-05-08 DIAGNOSIS — C3411 Malignant neoplasm of upper lobe, right bronchus or lung: Secondary | ICD-10-CM

## 2018-05-08 LAB — CMP (CANCER CENTER ONLY)
ALBUMIN: 3.3 g/dL — AB (ref 3.5–5.0)
ALK PHOS: 150 U/L — AB (ref 38–126)
ALT: 9 U/L (ref 0–44)
ANION GAP: 10 (ref 5–15)
AST: 12 U/L — ABNORMAL LOW (ref 15–41)
BUN: 14 mg/dL (ref 8–23)
CALCIUM: 9.8 mg/dL (ref 8.9–10.3)
CO2: 30 mmol/L (ref 22–32)
CREATININE: 0.94 mg/dL (ref 0.44–1.00)
Chloride: 101 mmol/L (ref 98–111)
GFR, Est AFR Am: >60 mL/min (ref >60)
GFR, Estimated: 58 mL/min — ABNORMAL LOW (ref >60)
Glucose, Bld: 99 mg/dL (ref 70–99)
Potassium: 3.7 mmol/L (ref 3.5–5.1)
SODIUM: 141 mmol/L (ref 135–145)
Total Bilirubin: 0.9 mg/dL (ref 0.3–1.2)
Total Protein: 7.1 g/dL (ref 6.5–8.1)

## 2018-05-08 LAB — CBC WITH DIFFERENTIAL (CANCER CENTER ONLY)
BASOS PCT: 1 %
Basophils Absolute: 0.1 10*3/uL (ref 0.0–0.1)
EOS ABS: 0.5 10*3/uL (ref 0.0–0.5)
Eosinophils Relative: 6 %
HCT: 32.2 % — ABNORMAL LOW (ref 34.8–46.6)
HEMOGLOBIN: 10.7 g/dL — AB (ref 11.6–15.9)
Lymphocytes Relative: 14 %
Lymphs Abs: 1.2 10*3/uL (ref 0.9–3.3)
MCH: 31.7 pg (ref 25.1–34.0)
MCHC: 33.2 g/dL (ref 31.5–36.0)
MCV: 95.3 fL (ref 79.5–101.0)
Monocytes Absolute: 0.5 10*3/uL (ref 0.1–0.9)
Monocytes Relative: 6 %
NEUTROS PCT: 73 %
Neutro Abs: 6.2 10*3/uL (ref 1.5–6.5)
Platelet Count: 287 10*3/uL (ref 145–400)
RBC: 3.38 MIL/uL — AB (ref 3.70–5.45)
RDW: 12.9 % (ref 11.2–14.5)
WBC: 8.5 10*3/uL (ref 3.9–10.3)

## 2018-05-08 LAB — TSH: TSH: 0.588 u[IU]/mL (ref 0.308–3.960)

## 2018-05-08 MED ORDER — ALTEPLASE 2 MG IJ SOLR
INTRAMUSCULAR | Status: AC
  Filled 2018-05-08: qty 2

## 2018-05-08 MED ORDER — AZITHROMYCIN 250 MG PO TABS: ORAL_TABLET | ORAL | 0 refills | Status: DC

## 2018-05-08 MED ORDER — HEPARIN SOD (PORK) LOCK FLUSH 100 UNIT/ML IV SOLN
500.0000 [IU] | Freq: Once | INTRAVENOUS | Status: AC | PRN
  Administered 2018-05-08: 500 [IU]
  Filled 2018-05-08: qty 5

## 2018-05-08 MED ORDER — ALTEPLASE 2 MG IJ SOLR
2.0000 mg | Freq: Once | INTRAMUSCULAR | Status: AC | PRN
  Administered 2018-05-08: 2 mg
  Filled 2018-05-08: qty 2

## 2018-05-08 MED ORDER — PREDNISONE 5 MG PO TABS: ORAL_TABLET | ORAL | 0 refills | Status: DC

## 2018-05-08 MED ORDER — SODIUM CHLORIDE 0.9% FLUSH
10.0000 mL | INTRAVENOUS | Status: DC | PRN
  Administered 2018-05-08: 10 mL
  Filled 2018-05-08: qty 10

## 2018-05-08 NOTE — Patient Instructions (Signed)
Implanted Port Home Guide An implanted port is a type of central line that is placed under the skin. Central lines are used to provide IV access when treatment or nutrition needs to be given through a person's veins. Implanted ports are used for long-term IV access. An implanted port may be placed because:  You need IV medicine that would be irritating to the small veins in your hands or arms.  You need long-term IV medicines, such as antibiotics.  You need IV nutrition for a long period.  You need frequent blood draws for lab tests.  You need dialysis.  Implanted ports are usually placed in the chest area, but they can also be placed in the upper arm, the abdomen, or the leg. An implanted port has two main parts:  Reservoir. The reservoir is round and will appear as a small, raised area under your skin. The reservoir is the part where a needle is inserted to give medicines or draw blood.  Catheter. The catheter is a thin, flexible tube that extends from the reservoir. The catheter is placed into a large vein. Medicine that is inserted into the reservoir goes into the catheter and then into the vein.  How will I care for my incision site? Do not get the incision site wet. Bathe or shower as directed by your health care provider. How is my port accessed? Special steps must be taken to access the port:  Before the port is accessed, a numbing cream can be placed on the skin. This helps numb the skin over the port site.  Your health care provider uses a sterile technique to access the port. ? Your health care provider must put on a mask and sterile gloves. ? The skin over your port is cleaned carefully with an antiseptic and allowed to dry. ? The port is gently pinched between sterile gloves, and a needle is inserted into the port.  Only "non-coring" port needles should be used to access the port. Once the port is accessed, a blood return should be checked. This helps ensure that the port  is in the vein and is not clogged.  If your port needs to remain accessed for a constant infusion, a clear (transparent) bandage will be placed over the needle site. The bandage and needle will need to be changed every week, or as directed by your health care provider.  Keep the bandage covering the needle clean and dry. Do not get it wet. Follow your health care provider's instructions on how to take a shower or bath while the port is accessed.  If your port does not need to stay accessed, no bandage is needed over the port.  What is flushing? Flushing helps keep the port from getting clogged. Follow your health care provider's instructions on how and when to flush the port. Ports are usually flushed with saline solution or a medicine called heparin. The need for flushing will depend on how the port is used.  If the port is used for intermittent medicines or blood draws, the port will need to be flushed: ? After medicines have been given. ? After blood has been drawn. ? As part of routine maintenance.  If a constant infusion is running, the port may not need to be flushed.  How long will my port stay implanted? The port can stay in for as long as your health care provider thinks it is needed. When it is time for the port to come out, surgery will be   done to remove it. The procedure is similar to the one performed when the port was put in. When should I seek immediate medical care? When you have an implanted port, you should seek immediate medical care if:  You notice a bad smell coming from the incision site.  You have swelling, redness, or drainage at the incision site.  You have more swelling or pain at the port site or the surrounding area.  You have a fever that is not controlled with medicine.  This information is not intended to replace advice given to you by your health care provider. Make sure you discuss any questions you have with your health care provider. Document  Released: 08/21/2005 Document Revised: 01/27/2016 Document Reviewed: 04/28/2013 Elsevier Interactive Patient Education  2017 [employer]  

## 2018-05-08 NOTE — Progress Notes (Signed)
Unable to get blood return for flush/lab draw per RN Learta Codding and RN Seth Bake.  [facility] admin.  Labs drawn from [facility].  Able to get blood return after 1.5 hours.  Pt tolerated well.  Deaccessed.

## 2018-05-10 NOTE — Progress Notes (Signed)
[facility] Progress Note   Kimberly [name] [id number] 1941-08-27 77 y.o.  Kimberly [name] is managed by Dr. Fanny Bien. Kimberly [name]  Actively treated with chemotherapy/immunotherapy: yes  Current Therapy: Opdivo  Last Treated: 04/18/2018 (cycle 4, day 1)  Assessment: Plan:    Dehydration - Plan: SCHEDULING COMMUNICATION, alteplase (CATHFLO ACTIVASE) injection 2 mg, sodium chloride flush (NS) 0.9 % injection 10 mL, heparin lock flush 100 unit/mL  Acute non-recurrent frontal sinusitis - Plan: azithromycin (ZITHROMAX) 250 MG tablet, predniSONE (DELTASONE) 5 MG tablet  Diarrhea, unspecified type  Recurrent non-small cell lung cancer ([place])   Acute frontal sinusitis: Patient was given a prescription for azithromycin and a 6-day prednisone taper.  Diarrhea: Patient was instructed to continue to push fluids and using Imodium as needed.  Recurrent  Non-small cell lung cancer: The patient continues to be managed by Dr. Julien Nordmann and is status post cycle 4, day 1 of Opdivo.  She is scheduled to be seen in follow-up on 05/16/2018.  Please see After Visit Summary for patient specific instructions.  Future Appointments  Date Time Provider [place]  05/16/2018  8:15 AM [facility] LAB 5 [facility] None  05/16/2018  8:45 AM [facility] [place] FLUSH [facility] None  05/16/2018  9:00 AM Curt Bears, MD [facility] None  05/16/2018 10:00 AM [facility] INFUSION [facility] None  05/16/2018 10:30 AM Karie Mainland, RD [facility] None  06/13/2018  8:00 AM [facility] LAB 4 [facility] None  06/13/2018  8:15 AM [facility] [place] FLUSH [facility] None  06/13/2018  8:45 AM Curt Bears, MD [facility] None  06/13/2018 10:00 AM [facility] INFUSION [facility] None  06/26/2018 11:00 AM [name], RN [facility] None  07/11/2018 10:30 AM [facility] LAB 3 [facility] None  07/11/2018 10:45 AM [facility] [place] FLUSH [facility] None  07/11/2018 11:15 AM Curt Bears, MD [facility]  None  07/11/2018 12:00 PM [facility] INFUSION [facility] None    Orders Placed This Encounter  Procedures  . SCHEDULING COMMUNICATION       Subjective:   Patient ID:  Kimberly [name] is a 77 y.o. (DOB 1941/03/15) female.  Chief Complaint: No chief complaint on file.   HPI Kimberly [name] is a 77 year old female with a history of a recurrent non-small cell lung cancer who is managed by Dr. Fanny Bien. Kimberly [name] and is status post cycle 4, day 1 of Opdivo which was dosed on 04/18/2018.  She presents to the office today with a report of having diarrhea 4 times daily since last Thursday despite her use of Imodium.  She is also had a productive cough with yellowish sputum, headache, pressure in her temples, wheezing, and a sensation that her upper teeth "feel different."  She has been taking cough medications.  She noted that her temperature was 99.8 last evening.  Today it is 99.5 with the patient's oxygen saturation being 100% on room air.  She denies nausea, vomiting, or sick contacts.  She states that she is not eating well since last Thursday.  Medications: I have reviewed the patient's current medications.  Allergies:  Allergies  Allergen Reactions  . Lactose Intolerance (Gi) Other (See Comments)  . Amiodarone Other (See Comments)    Corneal deposits  . Augmentin [Amoxicillin-Pot Clavulanate] Diarrhea    *ended up with cdiff* Has patient had a PCN reaction causing immediate rash, facial/tongue/throat swelling, SOB or lightheadedness with hypotension: No Has patient had a PCN reaction causing severe rash involving mucus membranes or skin necrosis: No Has patient had a PCN reaction that required hospitalization:  Yes Has patient had a PCN reaction occurring within the last 10 years: Yes If all of the above answers are "NO", then may proceed with Cephalosporin use.   . Milk-Related Compounds Other (See Comments)    GI upset, projectile vomiting - can't take any dairy products     Past Medical History:  Diagnosis Date  . Allergic asthma without acute exacerbation or status asthmaticus   . Aortic insufficiency   . Arthritis   . Atrial fibrillation ([place])   . Back pain 08/14/2016  . Bronchitis   . Cough    dry, endobronchial mass  . Dyslipidemia   . Family history of adverse reaction to anesthesia    pts son also experiences N/V  . GERD (gastroesophageal reflux disease)   . Heart murmur   . History of blood transfusion   . History of bronchitis   . History of chemotherapy   . History of nuclear stress test 02/26/2006   exercise myoview; normal pattern of perfusion; low risk scan   . Hypertension   . Hypothyroidism   . Mitral insufficiency   . Pneumonia   . PONV (postoperative nausea and vomiting)   . Radiation 12/01/13-01/08/14   50.4 gray to right central chest  . Renal mass, left 04/12/2017  . Shortness of breath    with exertion  . 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 Syncope    "states she has passed out a few times. dr is trying to find cause.last time when geeting ready to go home after video bronch/bx  . Thyroid disease     Past Surgical History:  Procedure Laterality Date  . CARDIAC CATHETERIZATION  07/08/2008   normal coronaries  . CARPAL TUNNEL RELEASE Right 1988  . COLONOSCOPY N/A 02/11/2016   Procedure: COLONOSCOPY;  Surgeon: Carol Ada, MD;  Location: [facility];  Service: Endoscopy;  Laterality: N/A;  . DILATION AND CURETTAGE OF UTERUS    . ESOPHAGOGASTRODUODENOSCOPY N/A 02/08/2017   Procedure: ESOPHAGOGASTRODUODENOSCOPY (EGD);  Surgeon: Carol Ada, MD;  Location: [facility];  Service: Endoscopy;  Laterality: N/A;  . EYE SURGERY Bilateral   . H/O MET Test w/PFT  04/02/2012   low risk; peak VO2 77% predicted  . IR GENERIC HISTORICAL  09/12/2016   IR FLUORO GUIDE PORT INSERTION RIGHT 09/12/2016 [name], MD [facility]  . IR GENERIC HISTORICAL  09/12/2016   IR US GUIDE VASC ACCESS RIGHT 09/12/2016 [name], MD  [facility]  . JOINT REPLACEMENT  2003   thumb rt  . KNEE ARTHROSCOPY  12   rt meniscus  . MEDIASTINOSCOPY N/A 10/30/2013   Procedure: MEDIASTINOSCOPY;  Surgeon: Melrose Nakayama, MD;  Location: [place];  Service: Thoracic;  Laterality: N/A;  . ROTATOR CUFF REPAIR  2006   ? side  . THYROIDECTOMY  1973  . TRANSTHORACIC ECHOCARDIOGRAM  09/25/2012   EF 55-60%; mild LVH & mild concentric hypertrophy; mild AV regurg; RV systolic pressure increase consistent with mild pulm HTN  . VIDEO ASSISTED THORACOSCOPY (VATS)/ LOBECTOMY Right 11/13/2013   Procedure: VIDEO ASSISTED THORACOSCOPY (VATS) with mediastinal  biopsies;  Surgeon: Melrose Nakayama, MD;  Location: [place];  Service: Thoracic;  Laterality: Right;  RIGHT VATS,mediastinal biopsies  . VIDEO BRONCHOSCOPY Bilateral 09/25/2013   Procedure: VIDEO BRONCHOSCOPY WITHOUT FLUORO;  Surgeon: Tanda Rockers, MD;  Location: [facility];  Service: Cardiopulmonary;  Laterality: Bilateral;  . VIDEO BRONCHOSCOPY WITH ENDOBRONCHIAL ULTRASOUND N/A 10/30/2013   Procedure: VIDEO BRONCHOSCOPY WITH ENDOBRONCHIAL ULTRASOUND;  Surgeon: Remo Lipps  Chaya Jan, MD;  Location: [place] [facility];  Service: Thoracic;  Laterality: N/A;    Family History  Problem Relation Age of Onset  . Lung cancer Mother   . Heart attack Father   . Heart failure Maternal Grandfather   . Heart attack Paternal Grandfather     Social History   Socioeconomic History  . Marital status: Divorced    Spouse name: Not on file  . Number of children: 2  . Years of education: Not on file  . Highest education level: Not on file  Occupational History  . Not on file  Social Needs  . Financial resource strain: Not on file  . Food insecurity:    Worry: Not on file    Inability: Not on file  . Transportation needs:    Medical: Not on file    Non-medical: Not on file  Tobacco Use  . Smoking status: Former Smoker    Packs/day: 0.50    Years: 6.00    Pack years: 3.00    Types: Cigarettes     Last attempt to quit: 09/05/1967    Years since quitting: 50.7  . Smokeless tobacco: Never Used  . Tobacco comment: occ wine  Substance and Sexual Activity  . Alcohol use: Yes    Comment: occasional 1-2 drinks/month  . Drug use: No  . Sexual activity: Not Currently  Lifestyle  . Physical activity:    Days per week: Not on file    Minutes per session: Not on file  . Stress: Not on file  Relationships  . Social connections:    Talks on phone: Not on file    Gets together: Not on file    Attends religious service: Not on file    Active member of club or organization: Not on file    Attends meetings of clubs or organizations: Not on file    Relationship status: Not on file  . Intimate partner violence:    Fear of current or ex partner: Not on file    Emotionally abused: Not on file    Physically abused: Not on file    Forced sexual activity: Not on file  Other Topics Concern  . Not on file  Social History Narrative  . Not on file    Past Medical History, Surgical history, Social history, and Family history were reviewed and updated as appropriate.   Please see review of systems for further details on the patient's review from today.   Review of Systems:  Review of Systems  Constitutional: Positive for appetite change. Negative for chills, diaphoresis, fatigue and fever.  HENT: Positive for congestion. Negative for facial swelling, postnasal drip, rhinorrhea, sinus pressure, sinus pain and sore throat.   Respiratory: Positive for cough. Negative for shortness of breath.   Cardiovascular: Negative for chest pain and palpitations.  Gastrointestinal: Positive for nausea. Negative for vomiting.  Neurological: Positive for headaches. Negative for dizziness.    Objective:   Physical Exam:  BP (!) 137/54 (BP Location: Left Arm, Patient Position: Sitting)   Pulse 65   Temp 98.5 F (36.9 C) (Oral)   Resp 17   SpO2 100%  ECOG: 0  Physical Exam  Constitutional: No distress.   HENT:  Head: Normocephalic and atraumatic.  Nose: Right sinus exhibits frontal sinus tenderness. Right sinus exhibits no maxillary sinus tenderness. Left sinus exhibits frontal sinus tenderness. Left sinus exhibits no maxillary sinus tenderness.  Mouth/Throat: No oropharyngeal exudate.  Cardiovascular: Normal rate, regular rhythm and normal heart  sounds. Exam reveals no gallop and no friction rub.  No murmur heard. Pulmonary/Chest: Effort normal and breath sounds normal. No respiratory distress. She has no wheezes. She has no rales.  Neurological: She is alert.  Skin: Skin is warm and dry. No rash noted. She is not diaphoretic. No erythema.    Lab Review:     Component Value Date/Time   NA 141 05/08/2018 0905   NA 140 07/12/2017 1212   K 3.7 05/08/2018 0905   K 3.5 07/12/2017 1212   CL 101 05/08/2018 0905   CO2 30 05/08/2018 0905   CO2 27 07/12/2017 1212   GLUCOSE 99 05/08/2018 0905   GLUCOSE 102 07/12/2017 1212   BUN 14 05/08/2018 0905   BUN 27.5 (H) 07/12/2017 1212   CREATININE 0.94 05/08/2018 0905   CREATININE 1.0 07/12/2017 1212   CALCIUM 9.8 05/08/2018 0905   CALCIUM 10.0 07/12/2017 1212   PROT 7.1 05/08/2018 0905   PROT 6.9 07/12/2017 1212   ALBUMIN 3.3 (L) 05/08/2018 0905   ALBUMIN 3.5 07/12/2017 1212   AST 12 (L) 05/08/2018 0905   AST 16 07/12/2017 1212   ALT 9 05/08/2018 0905   ALT 15 07/12/2017 1212   ALKPHOS 150 (H) 05/08/2018 0905   ALKPHOS 119 07/12/2017 1212   BILITOT 0.9 05/08/2018 0905   BILITOT 0.48 07/12/2017 1212   GFRNONAA 58 (L) 05/08/2018 0905   GFRAA >60 05/08/2018 0905       Component Value Date/Time   WBC 8.5 05/08/2018 0905   WBC 7.4 10/15/2017 1116   RBC 3.38 (L) 05/08/2018 0905   HGB 10.7 (L) 05/08/2018 0905   HGB 11.3 (L) 07/12/2017 1212   HCT 32.2 (L) 05/08/2018 0905   HCT 33.1 (L) 07/12/2017 1212   PLT 287 05/08/2018 0905   PLT 251 07/12/2017 1212   MCV 95.3 05/08/2018 0905   MCV 101.9 (H) 07/12/2017 1212   MCH 31.7 05/08/2018  0905   MCHC 33.2 05/08/2018 0905   RDW 12.9 05/08/2018 0905   RDW 12.3 07/12/2017 1212   LYMPHSABS 1.2 05/08/2018 0905   LYMPHSABS 1.4 07/12/2017 1212   MONOABS 0.5 05/08/2018 0905   MONOABS 0.6 07/12/2017 1212   EOSABS 0.5 05/08/2018 0905   EOSABS 0.2 07/12/2017 1212   BASOSABS 0.1 05/08/2018 0905   BASOSABS 0.1 07/12/2017 1212   -------------------------------  Imaging from last 24 hours (if applicable):  Radiology interpretation: Ct Chest W Contrast  Result Date: 04/16/2018 CLINICAL DATA:  Lung cancer, status post chemotherapy/XRT, ongoing immunotherapy, chronic shortness of breath EXAM: CT CHEST WITH CONTRAST TECHNIQUE: Multidetector CT imaging of the chest was performed during intravenous contrast administration. CONTRAST:  75m OMNIPAQUE IOHEXOL 300 MG/ML  SOLN COMPARISON:  01/15/2018 FINDINGS: Cardiovascular: The heart is normal in size. No pericardial effusion. No evidence of thoracic aortic aneurysm. Mild Atherosclerotic calcifications of the aortic arch. Three vessel coronary atherosclerosis. Right chest port terminates in the lower SVC. Mediastinum/Nodes: No suspicious mediastinal lymphadenopathy. Status post thyroidectomy. Lungs/Pleura: Radiation changes in the posterior right upper lobe/perihilar region. Stable right middle lobe masslike opacity measuring 3.1 x 3.9 cm, previously 3.2 x 3.8 cm when measured in a similar fashion, unchanged. New patchy/nodular opacities inferiorly in the left lower lobe and right lung base (series 5/image 107), possibly reflecting mild infection. Calcified left lower lobe granulomata. No pleural effusion or pneumothorax. Upper Abdomen: Visualized upper abdomen is notable for a 14 mm cystic/solid posterior interpolar right renal lesion with enhancement (series 2/image 139), worrisome for renal cell carcinoma. Musculoskeletal: Mild degenerative  changes of the mid/lower thoracic spine. IMPRESSION: Stable 3.1 x 3.9 cm right middle lobe masslike opacity,  grossly unchanged. Associated radiation changes in the right lung. New patchy/nodular opacities inferiorly in the left lower lobe and the right lung base, possibly reflecting mild infection. 14 mm cystic/solid nodule in the posterior right kidney, worrisome for renal cell carcinoma, grossly unchanged. Aortic Atherosclerosis (ICD10-I70.0). Electronically Signed   By: Julian Hy M.D.   On: 04/16/2018 14:53

## 2018-05-16 ENCOUNTER — Inpatient Hospital Stay (HOSPITAL_BASED_OUTPATIENT_CLINIC_OR_DEPARTMENT_OTHER): Payer: Medicare Other | Admitting: Internal Medicine

## 2018-05-16 ENCOUNTER — Inpatient Hospital Stay: Payer: Medicare Other

## 2018-05-16 ENCOUNTER — Encounter: Payer: Self-pay | Admitting: Internal Medicine

## 2018-05-16 ENCOUNTER — Inpatient Hospital Stay: Payer: Medicare Other | Admitting: Nutrition

## 2018-05-16 ENCOUNTER — Telehealth: Payer: Self-pay | Admitting: Internal Medicine

## 2018-05-16 VITALS — BP 126/62 | HR 70 | Temp 98.2°F | Resp 18 | Ht <= 58 in | Wt 150.5 lb

## 2018-05-16 DIAGNOSIS — Z5112 Encounter for antineoplastic immunotherapy: Secondary | ICD-10-CM | POA: Diagnosis not present

## 2018-05-16 DIAGNOSIS — I10 Essential (primary) hypertension: Secondary | ICD-10-CM

## 2018-05-16 DIAGNOSIS — C3490 Malignant neoplasm of unspecified part of unspecified bronchus or lung: Secondary | ICD-10-CM

## 2018-05-16 DIAGNOSIS — Z79899 Other long term (current) drug therapy: Secondary | ICD-10-CM | POA: Diagnosis not present

## 2018-05-16 DIAGNOSIS — R197 Diarrhea, unspecified: Secondary | ICD-10-CM | POA: Diagnosis not present

## 2018-05-16 DIAGNOSIS — R5383 Other fatigue: Secondary | ICD-10-CM | POA: Diagnosis not present

## 2018-05-16 DIAGNOSIS — R05 Cough: Secondary | ICD-10-CM

## 2018-05-16 DIAGNOSIS — E860 Dehydration: Secondary | ICD-10-CM

## 2018-05-16 DIAGNOSIS — G620 Drug-induced polyneuropathy: Secondary | ICD-10-CM | POA: Diagnosis not present

## 2018-05-16 DIAGNOSIS — Z452 Encounter for adjustment and management of vascular access device: Secondary | ICD-10-CM | POA: Diagnosis not present

## 2018-05-16 DIAGNOSIS — R0609 Other forms of dyspnea: Secondary | ICD-10-CM | POA: Diagnosis not present

## 2018-05-16 DIAGNOSIS — Z87891 Personal history of nicotine dependence: Secondary | ICD-10-CM | POA: Diagnosis not present

## 2018-05-16 DIAGNOSIS — C342 Malignant neoplasm of middle lobe, bronchus or lung: Secondary | ICD-10-CM | POA: Diagnosis not present

## 2018-05-16 DIAGNOSIS — J0110 Acute frontal sinusitis, unspecified: Secondary | ICD-10-CM | POA: Diagnosis not present

## 2018-05-16 DIAGNOSIS — C3411 Malignant neoplasm of upper lobe, right bronchus or lung: Secondary | ICD-10-CM

## 2018-05-16 LAB — CBC WITH DIFFERENTIAL (CANCER CENTER ONLY)
BASOS ABS: 0.1 10*3/uL (ref 0.0–0.1)
Basophils Relative: 1 %
EOS ABS: 0.3 10*3/uL (ref 0.0–0.5)
Eosinophils Relative: 3 %
HCT: 34.6 % — ABNORMAL LOW (ref 34.8–46.6)
HEMOGLOBIN: 11.6 g/dL (ref 11.6–15.9)
LYMPHS ABS: 1.5 10*3/uL (ref 0.9–3.3)
Lymphocytes Relative: 12 %
MCH: 31.5 pg (ref 25.1–34.0)
MCHC: 33.7 g/dL (ref 31.5–36.0)
MCV: 93.6 fL (ref 79.5–101.0)
Monocytes Absolute: 1 10*3/uL — ABNORMAL HIGH (ref 0.1–0.9)
Monocytes Relative: 8 %
NEUTROS PCT: 76 %
Neutro Abs: 8.9 10*3/uL — ABNORMAL HIGH (ref 1.5–6.5)
Platelet Count: 331 10*3/uL (ref 145–400)
RBC: 3.69 MIL/uL — AB (ref 3.70–5.45)
RDW: 13.3 % (ref 11.2–14.5)
WBC: 11.8 10*3/uL — AB (ref 3.9–10.3)

## 2018-05-16 LAB — CMP (CANCER CENTER ONLY)
ALK PHOS: 140 U/L — AB (ref 38–126)
ALT: 11 U/L (ref 0–44)
ANION GAP: 11 (ref 5–15)
AST: 12 U/L — ABNORMAL LOW (ref 15–41)
Albumin: 3.3 g/dL — ABNORMAL LOW (ref 3.5–5.0)
BILIRUBIN TOTAL: 0.6 mg/dL (ref 0.3–1.2)
BUN: 25 mg/dL — ABNORMAL HIGH (ref 8–23)
CALCIUM: 9.6 mg/dL (ref 8.9–10.3)
CO2: 31 mmol/L (ref 22–32)
Chloride: 98 mmol/L (ref 98–111)
Creatinine: 1.08 mg/dL — ABNORMAL HIGH (ref 0.44–1.00)
GFR, EST NON AFRICAN AMERICAN: 49 mL/min — AB (ref >60)
GFR, Est AFR Am: 57 mL/min — ABNORMAL LOW (ref >60)
GLUCOSE: 92 mg/dL (ref 70–99)
Potassium: 3.7 mmol/L (ref 3.5–5.1)
SODIUM: 140 mmol/L (ref 135–145)
TOTAL PROTEIN: 6.7 g/dL (ref 6.5–8.1)

## 2018-05-16 LAB — TSH: TSH: 1.125 u[IU]/mL (ref 0.308–3.960)

## 2018-05-16 MED ORDER — SODIUM CHLORIDE 0.9 % IV SOLN
Freq: Once | INTRAVENOUS | Status: AC
  Administered 2018-05-16: 10:00:00 via INTRAVENOUS
  Filled 2018-05-16: qty 250

## 2018-05-16 MED ORDER — SODIUM CHLORIDE 0.9% FLUSH
10.0000 mL | INTRAVENOUS | Status: DC | PRN
  Administered 2018-05-16: 10 mL
  Filled 2018-05-16: qty 10

## 2018-05-16 MED ORDER — SODIUM CHLORIDE 0.9 % IV SOLN
480.0000 mg | Freq: Once | INTRAVENOUS | Status: AC
  Administered 2018-05-16: 480 mg via INTRAVENOUS
  Filled 2018-05-16: qty 48

## 2018-05-16 MED ORDER — HEPARIN SOD (PORK) LOCK FLUSH 100 UNIT/ML IV SOLN
500.0000 [IU] | Freq: Once | INTRAVENOUS | Status: AC | PRN
  Administered 2018-05-16: 500 [IU]
  Filled 2018-05-16: qty 5

## 2018-05-16 NOTE — Patient Instructions (Addendum)
[place] Discharge Instructions for Patients Receiving Chemotherapy  Today you received the following chemotherapy agents nivolumab (Opdivo)  To help prevent nausea and vomiting after your treatment, we encourage you to take your nausea medication as directed by your doctor.   If you develop nausea and vomiting that is not controlled by your nausea medication, call the clinic.   BELOW ARE SYMPTOMS THAT SHOULD BE REPORTED IMMEDIATELY:  *FEVER GREATER THAN 100.5 F  *CHILLS WITH OR WITHOUT FEVER  NAUSEA AND VOMITING THAT IS NOT CONTROLLED WITH YOUR NAUSEA MEDICATION  *UNUSUAL SHORTNESS OF BREATH  *UNUSUAL BRUISING OR BLEEDING  TENDERNESS IN MOUTH AND THROAT WITH OR WITHOUT PRESENCE OF ULCERS  *URINARY PROBLEMS  *BOWEL PROBLEMS  UNUSUAL RASH Items with * indicate a potential emergency and should be followed up as soon as possible.  Feel free to call the clinic should you have any questions or concerns. The clinic phone number is [phone].  Please show the [place] at check-in to the Emergency Department and triage nurse.

## 2018-05-16 NOTE — Progress Notes (Signed)
[facility] Telephone:([phone]) [phone]   Fax:(336) [place], [place] [address] [place] [address]  DIAGNOSIS: Recurrent non-small cell lung cancer, squamous cell carcinoma initially diagnosed as unresectable a stage IIIa in February 2015.  PRIOR THERAPY: 1) Status post exploratory right VATS with mediastinal biopsy under the care of Dr. Roxan Hockey on 11/13/2013. The tumor was found to be unresectable at that time.[name] 2) Concurrent chemoradiation with weekly carboplatin for AUC of 2 and paclitaxel 45 mg/M2, status post 6 cycles with partial response. First cycle was given on 12/01/2013. 3) Consolidation chemotherapy with carboplatin for AUC of 5 and paclitaxel 175 mg/M2 every 3 weeks with Neulasta support. First dose 02/23/2014. Status post 3 cycles with partial response. 4) Systemic chemotherapy with carboplatin for AUC of 5 and paclitaxel 175 MG/M2 every 3 weeks with Neulasta support. Status post 6 cycles. First dose was given on 08/14/2016.  Last dose was giving 12/11/2016 with partial response.  CURRENT THERAPY: Second line treatment with immunotherapy with Nivolumab 480 mg IV every 4 weeks, first dose Jan 25, 2018.  Status post 3 cycles.  INTERVAL HISTORY: Kimberly [name] 77 y.o. female returns to the clinic today for follow-up visit.  The patient is feeling fine today with no specific complaints except for dry cough and shortness of breath with exertion.  She was seen few weeks ago by the physician assistant at the cancer center and started on treatment with antibiotics and prednisone and felt better.  The patient denied having any current chest pain or hemoptysis.  She denied having any recent weight loss or night sweats.  She has no nausea, vomiting, diarrhea or constipation.  She denied having any headache or visual changes.  She had repeat CT scan of the chest, abdomen and pelvis performed recently and she is here for  evaluation and discussion of her risk her results.  MEDICAL HISTORY: Past Medical History:  Diagnosis Date  . Allergic asthma without acute exacerbation or status asthmaticus   . Aortic insufficiency   . Arthritis   . Atrial fibrillation ([place])   . Back pain 08/14/2016  . Bronchitis   . Cough    dry, endobronchial mass  . Dyslipidemia   . Family history of adverse reaction to anesthesia    pts son also experiences N/V  . GERD (gastroesophageal reflux disease)   . Heart murmur   . History of blood transfusion   . History of bronchitis   . History of chemotherapy   . History of nuclear stress test 02/26/2006   exercise myoview; normal pattern of perfusion; low risk scan   . Hypertension   . Hypothyroidism   . Mitral insufficiency   . Pneumonia   . PONV (postoperative nausea and vomiting)   . Radiation 12/01/13-01/08/14   50.4 gray to right central chest  . Renal mass, left 04/12/2017  . Shortness of breath    with exertion  . Squamous cell carcinoma of lung ([place])   . Syncope    "states she has passed out a few times. dr is trying to find cause.last time when geeting ready to go home after video bronch/bx  . Thyroid disease     ALLERGIES:  is allergic to lactose intolerance (gi); amiodarone; augmentin [amoxicillin-pot clavulanate]; and milk-related compounds.  MEDICATIONS:  Current Outpatient Medications  Medication Sig Dispense Refill  . albuterol (PROVENTIL HFA;VENTOLIN HFA) 108 (90 BASE) MCG/ACT inhaler Inhale 1 puff into the lungs  every 6 (six) hours as needed for wheezing or shortness of breath.     [name] amLODipine (NORVASC) 5 MG tablet Take 5 mg by mouth daily.  3  . atorvastatin (LIPITOR) 20 MG tablet Take 20 mg by mouth every morning.     . benzonatate (TESSALON) 200 MG capsule Take 1 capsule (200 mg total) by mouth every 8 (eight) hours as needed for cough. 45 capsule 2  . Chlorpheniramine-DM (COUGH & COLD PO) Take by mouth at bedtime as needed.    . chlorthalidone  (HYGROTON) 25 MG tablet TAKE 1 TABLET BY MOUTH  DAILY 90 tablet 0  . KLOR-CON 8 MEQ tablet Take 8 mEq every other day by mouth.  3  . levothyroxine (SYNTHROID, LEVOTHROID) 100 MCG tablet Take 100 mcg daily by mouth.    . lidocaine-prilocaine (EMLA) cream Apply 1 application topically as needed. (Patient taking differently: Apply 1 application topically as needed (for port access). ) 30 g 0  . metoprolol succinate (TOPROL-XL) 25 MG 24 hr tablet Take 25 mg by mouth daily.    . montelukast (SINGULAIR) 10 MG tablet Take 10 mg by mouth at bedtime.     . Multiple Vitamins-Iron (MULTIVITAMIN/IRON PO) Take 1 tablet by mouth daily with breakfast.     . pantoprazole (PROTONIX) 40 MG tablet Take 1 tablet (40 mg total) by mouth 2 (two) times daily. 180 tablet 1  . prochlorperazine (COMPAZINE) 10 MG tablet Take 1 tablet (10 mg total) by mouth every 6 (six) hours as needed for nausea or vomiting. 30 tablet 0  . traMADol (ULTRAM) 50 MG tablet Take 50 mg every 8 (eight) hours as needed by mouth. Take 1/2 tablet  3  . amLODipine (NORVASC) 5 MG tablet Take 1 tablet (5 mg total) by mouth daily. 90 tablet 3   No current facility-administered medications for this visit.     SURGICAL HISTORY:  Past Surgical History:  Procedure Laterality Date  . CARDIAC CATHETERIZATION  07/08/2008   normal coronaries  . CARPAL TUNNEL RELEASE Right 1988  . COLONOSCOPY N/A 02/11/2016   Procedure: COLONOSCOPY;  Surgeon: Carol Ada, MD;  Location: [facility];  Service: Endoscopy;  Laterality: N/A;  . DILATION AND CURETTAGE OF UTERUS    . ESOPHAGOGASTRODUODENOSCOPY N/A 02/08/2017   Procedure: ESOPHAGOGASTRODUODENOSCOPY (EGD);  Surgeon: Carol Ada, MD;  Location: [facility];  Service: Endoscopy;  Laterality: N/A;  . EYE SURGERY Bilateral   . H/O MET Test w/PFT  04/02/2012   low risk; peak VO2 77% predicted  . IR GENERIC HISTORICAL  09/12/2016   IR FLUORO GUIDE PORT INSERTION RIGHT 09/12/2016 [name], MD [facility]  .  IR GENERIC HISTORICAL  09/12/2016   IR US GUIDE VASC ACCESS RIGHT 09/12/2016 [name], MD [facility]  . JOINT REPLACEMENT  2003   thumb rt  . KNEE ARTHROSCOPY  12   rt meniscus  . MEDIASTINOSCOPY N/A 10/30/2013   Procedure: MEDIASTINOSCOPY;  Surgeon: Melrose Nakayama, MD;  Location: [facility];  Service: Thoracic;  Laterality: N/A;  . ROTATOR CUFF REPAIR  2006   ? side  . THYROIDECTOMY  1973  . TRANSTHORACIC ECHOCARDIOGRAM  09/25/2012   EF 55-60%; mild LVH & mild concentric hypertrophy; mild AV regurg; RV systolic pressure increase consistent with mild pulm HTN  . VIDEO ASSISTED THORACOSCOPY (VATS)/ LOBECTOMY Right 11/13/2013   Procedure: VIDEO ASSISTED THORACOSCOPY (VATS) with mediastinal  biopsies;  Surgeon: Melrose Nakayama, MD;  Location: [facility];  Service: Thoracic;  Laterality: Right;  RIGHT  VATS,mediastinal biopsies  . VIDEO BRONCHOSCOPY Bilateral 09/25/2013   Procedure: VIDEO BRONCHOSCOPY WITHOUT FLUORO;  Surgeon: Tanda Rockers, MD;  Location: [facility];  Service: Cardiopulmonary;  Laterality: Bilateral;  . VIDEO BRONCHOSCOPY WITH ENDOBRONCHIAL ULTRASOUND N/A 10/30/2013   Procedure: VIDEO BRONCHOSCOPY WITH ENDOBRONCHIAL ULTRASOUND;  Surgeon: Melrose Nakayama, MD;  Location: [facility];  Service: Thoracic;  Laterality: N/A;    REVIEW OF SYSTEMS:  Constitutional: negative Eyes: negative Ears, nose, mouth, throat, and face: negative Respiratory: positive for cough and dyspnea on exertion Cardiovascular: negative Gastrointestinal: negative Genitourinary:negative Integument/breast: negative Hematologic/lymphatic: negative Musculoskeletal:negative Neurological: negative Behavioral/Psych: negative Endocrine: negative Allergic/Immunologic: negative   PHYSICAL EXAMINATION: General appearance: alert, cooperative and no distress Head: Normocephalic, without obvious abnormality, atraumatic Neck: no adenopathy, no JVD, supple, symmetrical, trachea midline and thyroid not  enlarged, symmetric, no tenderness/mass/nodules Lymph nodes: Cervical, supraclavicular, and axillary nodes normal. Resp: clear to auscultation bilaterally Back: symmetric, no curvature. ROM normal. No CVA tenderness. Cardio: regular rate and rhythm, S1, S2 normal, no murmur, click, rub or gallop GI: soft, non-tender; bowel sounds normal; no masses,  no organomegaly Extremities: extremities normal, atraumatic, no cyanosis or edema Neurologic: Alert and oriented X 3, normal strength and tone. Normal symmetric reflexes. Normal coordination and gait  ECOG PERFORMANCE STATUS: 1 - Symptomatic but completely ambulatory  Blood pressure 126/62, pulse 70, temperature 98.2 F (36.8 C), temperature source Oral, resp. rate 18, height 4' 10" (1.473 m), weight 150 lb 8 oz (68.3 kg), SpO2 98 %.  LABORATORY DATA: Lab Results  Component Value Date   WBC 11.8 (H) 05/16/2018   HGB 11.6 05/16/2018   HCT 34.6 (L) 05/16/2018   MCV 93.6 05/16/2018   PLT 331 05/16/2018      Chemistry      Component Value Date/Time   NA 140 05/16/2018 0828   NA 140 07/12/2017 1212   K 3.7 05/16/2018 0828   K 3.5 07/12/2017 1212   CL 98 05/16/2018 0828   CO2 31 05/16/2018 0828   CO2 27 07/12/2017 1212   BUN 25 (H) 05/16/2018 0828   BUN 27.5 (H) 07/12/2017 1212   CREATININE 1.08 (H) 05/16/2018 0828   CREATININE 1.0 07/12/2017 1212      Component Value Date/Time   CALCIUM 9.6 05/16/2018 0828   CALCIUM 10.0 07/12/2017 1212   ALKPHOS 140 (H) 05/16/2018 0828   ALKPHOS 119 07/12/2017 1212   AST 12 (L) 05/16/2018 0828   AST 16 07/12/2017 1212   ALT 11 05/16/2018 0828   ALT 15 07/12/2017 1212   BILITOT 0.6 05/16/2018 0828   BILITOT 0.48 07/12/2017 1212       RADIOGRAPHIC STUDIES: Ct Chest W Contrast  Result Date: 04/16/2018 CLINICAL DATA:  Lung cancer, status post chemotherapy/XRT, ongoing immunotherapy, chronic shortness of breath EXAM: CT CHEST WITH CONTRAST TECHNIQUE: Multidetector CT imaging of the chest  was performed during intravenous contrast administration. CONTRAST:  23m OMNIPAQUE IOHEXOL 300 MG/ML  SOLN COMPARISON:  01/15/2018 FINDINGS: Cardiovascular: The heart is normal in size. No pericardial effusion. No evidence of thoracic aortic aneurysm. Mild Atherosclerotic calcifications of the aortic arch. Three vessel coronary atherosclerosis. Right chest port terminates in the lower SVC. Mediastinum/Nodes: No suspicious mediastinal lymphadenopathy. Status post thyroidectomy. Lungs/Pleura: Radiation changes in the posterior right upper lobe/perihilar region. Stable right middle lobe masslike opacity measuring 3.1 x 3.9 cm, previously 3.2 x 3.8 cm when measured in a similar fashion, unchanged. New patchy/nodular opacities inferiorly in the left lower lobe and right lung base (series 5/image 107), possibly  reflecting mild infection. Calcified left lower lobe granulomata. No pleural effusion or pneumothorax. Upper Abdomen: Visualized upper abdomen is notable for a 14 mm cystic/solid posterior interpolar right renal lesion with enhancement (series 2/image 139), worrisome for renal cell carcinoma. Musculoskeletal: Mild degenerative changes of the mid/lower thoracic spine. IMPRESSION: Stable 3.1 x 3.9 cm right middle lobe masslike opacity, grossly unchanged. Associated radiation changes in the right lung. New patchy/nodular opacities inferiorly in the left lower lobe and the right lung base, possibly reflecting mild infection. 14 mm cystic/solid nodule in the posterior right kidney, worrisome for renal cell carcinoma, grossly unchanged. Aortic Atherosclerosis (ICD10-I70.0). Electronically Signed   By: Julian Hy M.D.   On: 04/16/2018 14:53    ASSESSMENT AND PLAN: This is a very pleasant 77 years old white female with recurrent non-small cell lung cancer, squamous cell carcinoma. The patient completed 6 cycles of systemic chemotherapy with carboplatin and paclitaxel and tolerated her treatment well except for  fatigue as well as peripheral neuropathy. The patient has been in observation.  She continues to have persistent dry cough and shortness of breath. She had evidence for disease progression on restaging scan. The patient was a started on treatment with second line immunotherapy with Nivolumab 480 mg IV every 4 weeks status post 4 cycles.  She has been tolerating this treatment well with no concerning adverse effect except for the recent cough and shortness of breath. The patient had repeat CT scan of the chest performed recently.  I personally and independently reviewed the scan images and discussed the results with the patient today.  Her scan showed no concerning findings for disease progression but there was inflammatory process in the left lower lobe of the lung suspicious for infectious versus immunotherapy mediated pneumonitis. I recommended for the patient to continue her current treatment with nivolumab and she will proceed with cycle #5 today. I will see her back for follow-up visit in 4 weeks for evaluation before starting cycle #6. She was advised to call immediately if she has any worsening of her cough or shortness of breath.  This could be immunotherapy mediated pneumonitis and the patient will need treatment with high-dose steroids. She was advised to call immediately if she has any other concerning symptoms. The patient voices understanding of current disease status and treatment options and is in agreement with the current care plan. All questions were answered. The patient knows to call the clinic with any problems, questions or concerns. We can certainly see the patient much sooner if necessary.   Disclaimer: This note was dictated with voice recognition software. Similar sounding words can inadvertently be transcribed and may not be corrected upon review.

## 2018-05-16 NOTE — Progress Notes (Signed)
Rcvd staff msg from nurse Armanda Magic stating pt wanted to discuss financial assistance.  I called the pt and she had concerns about her bills because they are increasing and she can't afford the [employer] payment that customer service quoted her.  Since there aren't any foundations offering copay assistance for her Dx and the type of ins she has I did a conference call w/ customer service inquiring about the [employer] program.  The rep Vincente Liberty said he will mail her an application.  Once the application and the required docs are received from the pt, if she qualifies the discount will be good for 6 months.  The pt verbalized understanding and was very appreciative.

## 2018-05-16 NOTE — Telephone Encounter (Signed)
Scheduled appt pe r9/12 los - pt to get an updated schedule in treatment area.

## 2018-05-16 NOTE — Progress Notes (Signed)
Nutrition follow-up completed with patient during infusion for recurrent lung cancer. Weight decreased and documented as 150.5 pounds September 12 decreased from 153.8 pounds August 15. Reports she has to force herself to eat. She is still struggling with nausea. She is unable to tolerate oral nutrition supplements or sweet foods or drink. Reports she is trying to increase overall protein intake.  Nutrition diagnosis: Food and nutrition related knowledge deficit improved.  Intervention: Educated patient to continue strategies for increasing calories and protein to minimize further weight loss. Recommend patient explore nondairy options for increased calories and protein. Teach back method used.  Monitoring, evaluation, goals: Patient will work to increase calories and protein to minimize weight loss.  Next visit: To be scheduled as needed.  **Disclaimer: This note was dictated with voice recognition software. Similar sounding words can inadvertently be transcribed and this note may contain transcription errors which may not have been corrected upon publication of note.**

## 2018-05-27 ENCOUNTER — Other Ambulatory Visit: Payer: Self-pay | Admitting: Pulmonary Disease

## 2018-05-29 DIAGNOSIS — Z1231 Encounter for screening mammogram for malignant neoplasm of breast: Secondary | ICD-10-CM | POA: Diagnosis not present

## 2018-06-05 DIAGNOSIS — R922 Inconclusive mammogram: Secondary | ICD-10-CM | POA: Diagnosis not present

## 2018-06-06 DIAGNOSIS — E1122 Type 2 diabetes mellitus with diabetic chronic kidney disease: Secondary | ICD-10-CM | POA: Diagnosis not present

## 2018-06-06 DIAGNOSIS — E7800 Pure hypercholesterolemia, unspecified: Secondary | ICD-10-CM | POA: Diagnosis not present

## 2018-06-06 DIAGNOSIS — M810 Age-related osteoporosis without current pathological fracture: Secondary | ICD-10-CM | POA: Diagnosis not present

## 2018-06-11 DIAGNOSIS — Z23 Encounter for immunization: Secondary | ICD-10-CM | POA: Diagnosis not present

## 2018-06-11 DIAGNOSIS — E7800 Pure hypercholesterolemia, unspecified: Secondary | ICD-10-CM | POA: Diagnosis not present

## 2018-06-11 DIAGNOSIS — Z0000 Encounter for general adult medical examination without abnormal findings: Secondary | ICD-10-CM | POA: Diagnosis not present

## 2018-06-13 ENCOUNTER — Encounter: Payer: Self-pay | Admitting: Internal Medicine

## 2018-06-13 ENCOUNTER — Inpatient Hospital Stay: Payer: Medicare Other

## 2018-06-13 ENCOUNTER — Inpatient Hospital Stay: Payer: Medicare Other | Attending: Internal Medicine

## 2018-06-13 ENCOUNTER — Telehealth: Payer: Self-pay | Admitting: Internal Medicine

## 2018-06-13 ENCOUNTER — Inpatient Hospital Stay (HOSPITAL_BASED_OUTPATIENT_CLINIC_OR_DEPARTMENT_OTHER): Payer: Medicare Other | Admitting: Internal Medicine

## 2018-06-13 VITALS — BP 131/69 | HR 84 | Temp 98.0°F | Resp 18 | Ht <= 58 in | Wt 150.1 lb

## 2018-06-13 DIAGNOSIS — C342 Malignant neoplasm of middle lobe, bronchus or lung: Secondary | ICD-10-CM

## 2018-06-13 DIAGNOSIS — Z95828 Presence of other vascular implants and grafts: Secondary | ICD-10-CM

## 2018-06-13 DIAGNOSIS — R112 Nausea with vomiting, unspecified: Secondary | ICD-10-CM

## 2018-06-13 DIAGNOSIS — R0602 Shortness of breath: Secondary | ICD-10-CM | POA: Diagnosis not present

## 2018-06-13 DIAGNOSIS — R05 Cough: Secondary | ICD-10-CM | POA: Diagnosis not present

## 2018-06-13 DIAGNOSIS — Z5112 Encounter for antineoplastic immunotherapy: Secondary | ICD-10-CM | POA: Insufficient documentation

## 2018-06-13 DIAGNOSIS — C3490 Malignant neoplasm of unspecified part of unspecified bronchus or lung: Secondary | ICD-10-CM

## 2018-06-13 DIAGNOSIS — Z79899 Other long term (current) drug therapy: Secondary | ICD-10-CM | POA: Diagnosis not present

## 2018-06-13 DIAGNOSIS — C3411 Malignant neoplasm of upper lobe, right bronchus or lung: Secondary | ICD-10-CM

## 2018-06-13 DIAGNOSIS — I10 Essential (primary) hypertension: Secondary | ICD-10-CM

## 2018-06-13 LAB — CBC WITH DIFFERENTIAL (CANCER CENTER ONLY)
Abs Immature Granulocytes: 0.06 10*3/uL (ref 0.00–0.07)
BASOS ABS: 0.1 10*3/uL (ref 0.0–0.1)
Basophils Relative: 1 %
EOS PCT: 4 %
Eosinophils Absolute: 0.3 10*3/uL (ref 0.0–0.5)
HEMATOCRIT: 32 % — AB (ref 36.0–46.0)
HEMOGLOBIN: 10.5 g/dL — AB (ref 12.0–15.0)
IMMATURE GRANULOCYTES: 1 %
LYMPHS ABS: 0.8 10*3/uL (ref 0.7–4.0)
LYMPHS PCT: 10 %
MCH: 31.2 pg (ref 26.0–34.0)
MCHC: 32.8 g/dL (ref 30.0–36.0)
MCV: 95 fL (ref 80.0–100.0)
MONOS PCT: 9 %
Monocytes Absolute: 0.7 10*3/uL (ref 0.1–1.0)
NEUTROS PCT: 75 %
NRBC: 0 % (ref 0.0–0.2)
Neutro Abs: 5.5 10*3/uL (ref 1.7–7.7)
Platelet Count: 257 10*3/uL (ref 150–400)
RBC: 3.37 MIL/uL — ABNORMAL LOW (ref 3.87–5.11)
RDW: 12.7 % (ref 11.5–15.5)
WBC Count: 7.3 10*3/uL (ref 4.0–10.5)

## 2018-06-13 LAB — CMP (CANCER CENTER ONLY)
ALK PHOS: 145 U/L — AB (ref 38–126)
ALT: 10 U/L (ref 0–44)
ANION GAP: 11 (ref 5–15)
AST: 14 U/L — ABNORMAL LOW (ref 15–41)
Albumin: 3.3 g/dL — ABNORMAL LOW (ref 3.5–5.0)
BILIRUBIN TOTAL: 0.5 mg/dL (ref 0.3–1.2)
BUN: 22 mg/dL (ref 8–23)
CO2: 27 mmol/L (ref 22–32)
CREATININE: 1.2 mg/dL — AB (ref 0.44–1.00)
Calcium: 9.5 mg/dL (ref 8.9–10.3)
Chloride: 102 mmol/L (ref 98–111)
GFR, EST AFRICAN AMERICAN: 50 mL/min — AB (ref >60)
GFR, EST NON AFRICAN AMERICAN: 43 mL/min — AB (ref >60)
Glucose, Bld: 108 mg/dL — ABNORMAL HIGH (ref 70–99)
Potassium: 3.5 mmol/L (ref 3.5–5.1)
SODIUM: 140 mmol/L (ref 135–145)
Total Protein: 7 g/dL (ref 6.5–8.1)

## 2018-06-13 LAB — TSH: TSH: 1.2 u[IU]/mL (ref 0.308–3.960)

## 2018-06-13 MED ORDER — SODIUM CHLORIDE 0.9 % IV SOLN
Freq: Once | INTRAVENOUS | Status: AC
  Administered 2018-06-13: 11:00:00 via INTRAVENOUS
  Filled 2018-06-13: qty 250

## 2018-06-13 MED ORDER — SODIUM CHLORIDE 0.9% FLUSH
10.0000 mL | INTRAVENOUS | Status: DC | PRN
  Administered 2018-06-13: 10 mL via INTRAVENOUS
  Filled 2018-06-13: qty 10

## 2018-06-13 MED ORDER — SODIUM CHLORIDE 0.9 % IV SOLN
480.0000 mg | Freq: Once | INTRAVENOUS | Status: AC
  Administered 2018-06-13: 480 mg via INTRAVENOUS
  Filled 2018-06-13: qty 48

## 2018-06-13 NOTE — Telephone Encounter (Signed)
Appts scheduled avs/calendar printed / contrast provided per 10/10 los

## 2018-06-13 NOTE — Patient Instructions (Signed)
[facility] Discharge Instructions for Patients Receiving Chemotherapy  Today you received the following chemotherapy agents nivolumab (Opdivo)  To help prevent nausea and vomiting after your treatment, we encourage you to take your nausea medication as directed by your doctor.   If you develop nausea and vomiting that is not controlled by your nausea medication, call the clinic.   BELOW ARE SYMPTOMS THAT SHOULD BE REPORTED IMMEDIATELY:  *FEVER GREATER THAN 100.5 F  *CHILLS WITH OR WITHOUT FEVER  NAUSEA AND VOMITING THAT IS NOT CONTROLLED WITH YOUR NAUSEA MEDICATION  *UNUSUAL SHORTNESS OF BREATH  *UNUSUAL BRUISING OR BLEEDING  TENDERNESS IN MOUTH AND THROAT WITH OR WITHOUT PRESENCE OF ULCERS  *URINARY PROBLEMS  *BOWEL PROBLEMS  UNUSUAL RASH Items with * indicate a potential emergency and should be followed up as soon as possible.  Feel free to call the clinic should you have any questions or concerns. The clinic phone number is [phone].  Please show the [facility] at check-in to the Emergency Department and triage nurse.

## 2018-06-13 NOTE — Progress Notes (Signed)
[place] [phone]   [phone]) [place], MD [address] [place] [address]  DIAGNOSIS: Recurrent non-small cell lung cancer, squamous cell carcinoma initially diagnosed as unresectable a stage IIIa in February 2015.  PRIOR THERAPY: 1) Status post exploratory right VATS with mediastinal biopsy under the care of Dr. Roxan Hockey on 11/13/2013. The tumor was found to be unresectable at that time.[name] 2) Concurrent chemoradiation with weekly carboplatin for AUC of 2 and paclitaxel 45 mg/M2, status post 6 cycles with partial response. First cycle was given on 12/01/2013. 3) Consolidation chemotherapy with carboplatin for AUC of 5 and paclitaxel 175 mg/M2 every 3 weeks with Neulasta support. First dose 02/23/2014. Status post 3 cycles with partial response. 4) Systemic chemotherapy with carboplatin for AUC of 5 and paclitaxel 175 MG/M2 every 3 weeks with Neulasta support. Status post 6 cycles. First dose was given on 08/14/2016.  Last dose was giving 12/11/2016 with partial response.  CURRENT THERAPY: Second line treatment with immunotherapy with Nivolumab 480 mg IV every 4 weeks, first dose Jan 25, 2018.  Status post 5 cycles.  INTERVAL HISTORY: Kimberly [name] 77 y.o. female returns to the clinic today for follow-up visit.  The patient is feeling fine today with no specific complaints except for occasional nausea and vomiting.  She also has 1 or 2 days of diarrhea after her treatment.  She denied having any chest pain, shortness of breath but continues to have mild cough with no hemoptysis.  She denied having any fever or chills.  She has no nausea, vomiting, diarrhea or constipation.  She has no recent weight loss or night sweats.  She is here today for evaluation before starting cycle #6 of her treatment.  MEDICAL HISTORY: Past Medical History:  Diagnosis Date  . Allergic asthma without acute  exacerbation or status asthmaticus   . Aortic insufficiency   . Arthritis   . Atrial fibrillation ([place])   . Back pain 08/14/2016  . Bronchitis   . Cough    dry, endobronchial mass  . Dyslipidemia   . Family history of adverse reaction to anesthesia    pts son also experiences N/V  . GERD (gastroesophageal reflux disease)   . Heart murmur   . History of blood transfusion   . History of bronchitis   . History of chemotherapy   . History of nuclear stress test 02/26/2006   exercise myoview; normal pattern of perfusion; low risk scan   . Hypertension   . Hypothyroidism   . Mitral insufficiency   . Pneumonia   . PONV (postoperative nausea and vomiting)   . Radiation 12/01/13-01/08/14   50.4 gray to right central chest  . Renal mass, left 04/12/2017  . Shortness of breath    with exertion  . Squamous cell carcinoma of lung ([place])   . Syncope    "states she has passed out a few times. dr is trying to find cause.last time when geeting ready to go home after video bronch/bx  . Thyroid disease     ALLERGIES:  is allergic to lactose intolerance (gi); amiodarone; augmentin [amoxicillin-pot clavulanate]; and milk-related compounds.  MEDICATIONS:  Current Outpatient Medications  Medication Sig Dispense Refill  . albuterol (PROVENTIL HFA;VENTOLIN HFA) 108 (90 BASE) MCG/ACT inhaler Inhale 1 puff into the lungs every 6 (six) hours as needed for wheezing or shortness of breath.     [name] amLODipine (NORVASC) 5 MG tablet Take 1  tablet (5 mg total) by mouth daily. 90 tablet 3  . amLODipine (NORVASC) 5 MG tablet Take 5 mg by mouth daily.  3  . atorvastatin (LIPITOR) 20 MG tablet Take 20 mg by mouth every morning.     . benzonatate (TESSALON) 200 MG capsule Take 1 capsule (200 mg total) by mouth every 8 (eight) hours as needed for cough. 45 capsule 2  . Chlorpheniramine-DM (COUGH & COLD PO) Take by mouth at bedtime as needed.    . chlorthalidone (HYGROTON) 25 MG tablet TAKE 1 TABLET BY MOUTH  DAILY 90  tablet 0  . KLOR-CON 8 MEQ tablet Take 8 mEq every other day by mouth.  3  . levothyroxine (SYNTHROID, LEVOTHROID) 100 MCG tablet Take 100 mcg daily by mouth.    . lidocaine-prilocaine (EMLA) cream Apply 1 application topically as needed. (Patient taking differently: Apply 1 application topically as needed (for port access). ) 30 g 0  . metoprolol succinate (TOPROL-XL) 25 MG 24 hr tablet Take 25 mg by mouth daily.    . montelukast (SINGULAIR) 10 MG tablet Take 10 mg by mouth at bedtime.     . Multiple Vitamins-Iron (MULTIVITAMIN/IRON PO) Take 1 tablet by mouth daily with breakfast.     . pantoprazole (PROTONIX) 40 MG tablet TAKE 1 TABLET BY MOUTH TWO  TIMES DAILY 120 tablet 0  . prochlorperazine (COMPAZINE) 10 MG tablet Take 1 tablet (10 mg total) by mouth every 6 (six) hours as needed for nausea or vomiting. 30 tablet 0  . traMADol (ULTRAM) 50 MG tablet Take 50 mg every 8 (eight) hours as needed by mouth. Take 1/2 tablet  3   No current facility-administered medications for this visit.     SURGICAL HISTORY:  Past Surgical History:  Procedure Laterality Date  . CARDIAC CATHETERIZATION  07/08/2008   normal coronaries  . CARPAL TUNNEL RELEASE Right 1988  . COLONOSCOPY N/A 02/11/2016   Procedure: COLONOSCOPY;  Surgeon: Carol Ada, MD;  Location: [facility];  Service: Endoscopy;  Laterality: N/A;  . DILATION AND CURETTAGE OF UTERUS    . ESOPHAGOGASTRODUODENOSCOPY N/A 02/08/2017   Procedure: ESOPHAGOGASTRODUODENOSCOPY (EGD);  Surgeon: Carol Ada, MD;  Location: [facility];  Service: Endoscopy;  Laterality: N/A;  . EYE SURGERY Bilateral   . H/O MET Test w/PFT  04/02/2012   low risk; peak VO2 77% predicted  . IR GENERIC HISTORICAL  09/12/2016   IR FLUORO GUIDE PORT INSERTION RIGHT 09/12/2016 [name], MD [facility]  . IR GENERIC HISTORICAL  09/12/2016   IR US GUIDE VASC ACCESS RIGHT 09/12/2016 [name], MD [facility]  . JOINT REPLACEMENT  2003   thumb rt  . KNEE  ARTHROSCOPY  12   rt meniscus  . MEDIASTINOSCOPY N/A 10/30/2013   Procedure: MEDIASTINOSCOPY;  Surgeon: Melrose Nakayama, MD;  Location: [facility];  Service: Thoracic;  Laterality: N/A;  . ROTATOR CUFF REPAIR  2006   ? side  . THYROIDECTOMY  1973  . TRANSTHORACIC ECHOCARDIOGRAM  09/25/2012   EF 55-60%; mild LVH & mild concentric hypertrophy; mild AV regurg; RV systolic pressure increase consistent with mild pulm HTN  . VIDEO ASSISTED THORACOSCOPY (VATS)/ LOBECTOMY Right 11/13/2013   Procedure: VIDEO ASSISTED THORACOSCOPY (VATS) with mediastinal  biopsies;  Surgeon: Melrose Nakayama, MD;  Location: [facility];  Service: Thoracic;  Laterality: Right;  RIGHT VATS,mediastinal biopsies  . VIDEO BRONCHOSCOPY Bilateral 09/25/2013   Procedure: VIDEO BRONCHOSCOPY WITHOUT FLUORO;  Surgeon: Tanda Rockers, MD;  Location: [facility];  Service:  Cardiopulmonary;  Laterality: Bilateral;  . VIDEO BRONCHOSCOPY WITH ENDOBRONCHIAL ULTRASOUND N/A 10/30/2013   Procedure: VIDEO BRONCHOSCOPY WITH ENDOBRONCHIAL ULTRASOUND;  Surgeon: Melrose Nakayama, MD;  Location: [facility];  Service: Thoracic;  Laterality: N/A;    REVIEW OF SYSTEMS:  A comprehensive review of systems was negative except for: Respiratory: positive for cough Gastrointestinal: positive for diarrhea   PHYSICAL EXAMINATION: General appearance: alert, cooperative and no distress Head: Normocephalic, without obvious abnormality, atraumatic Neck: no adenopathy, no JVD, supple, symmetrical, trachea midline and thyroid not enlarged, symmetric, no tenderness/mass/nodules Lymph nodes: Cervical, supraclavicular, and axillary nodes normal. Resp: clear to auscultation bilaterally Back: symmetric, no curvature. ROM normal. No CVA tenderness. Cardio: regular rate and rhythm, S1, S2 normal, no murmur, click, rub or gallop GI: soft, non-tender; bowel sounds normal; no masses,  no organomegaly Extremities: extremities normal, atraumatic, no cyanosis or edema  ECOG  PERFORMANCE STATUS: 1 - Symptomatic but completely ambulatory  Blood pressure 131/69, pulse 84, temperature 98 F (36.7 C), temperature source Oral, resp. rate 18, height '4\' 10"'  (1.473 m), weight 150 lb 1.6 oz (68.1 kg), SpO2 99 %.  LABORATORY DATA: Lab Results  Component Value Date   WBC 7.3 06/13/2018   HGB 10.5 (L) 06/13/2018   HCT 32.0 (L) 06/13/2018   MCV 95.0 06/13/2018   PLT 257 06/13/2018      Chemistry      Component Value Date/Time   NA 140 05/16/2018 0828   NA 140 07/12/2017 1212   K 3.7 05/16/2018 0828   K 3.5 07/12/2017 1212   CL 98 05/16/2018 0828   CO2 31 05/16/2018 0828   CO2 27 07/12/2017 1212   BUN 25 (H) 05/16/2018 0828   BUN 27.5 (H) 07/12/2017 1212   CREATININE 1.08 (H) 05/16/2018 0828   CREATININE 1.0 07/12/2017 1212      Component Value Date/Time   CALCIUM 9.6 05/16/2018 0828   CALCIUM 10.0 07/12/2017 1212   ALKPHOS 140 (H) 05/16/2018 0828   ALKPHOS 119 07/12/2017 1212   AST 12 (L) 05/16/2018 0828   AST 16 07/12/2017 1212   ALT 11 05/16/2018 0828   ALT 15 07/12/2017 1212   BILITOT 0.6 05/16/2018 0828   BILITOT 0.48 07/12/2017 1212       RADIOGRAPHIC STUDIES: No results found.  ASSESSMENT AND PLAN: This is a very pleasant 77 years old white female with recurrent non-small cell lung cancer, squamous cell carcinoma. The patient completed 6 cycles of systemic chemotherapy with carboplatin and paclitaxel and tolerated her treatment well except for fatigue as well as peripheral neuropathy. The patient has been in observation.  She continues to have persistent dry cough and shortness of breath. She had evidence for disease progression on restaging scan. The patient was a started on treatment with second line immunotherapy with Nivolumab 480 mg IV every 4 weeks status post 5 cycles.  The patient continues to tolerate this treatment well with no concerning adverse effect except for mild nausea and vomiting as well as few episodes of diarrhea. I  recommended for her to proceed with cycle #6 today as a scheduled. I will see her back for follow-up visit in 4 weeks for evaluation after repeating CT scan of the chest, abdomen and pelvis for restaging of her disease. The patient was advised to call immediately if she has any concerning symptoms in the interval. The patient voices understanding of current disease status and treatment options and is in agreement with the current care plan. All questions were answered. The  patient knows to call the clinic with any problems, questions or concerns. We can certainly see the patient much sooner if necessary.   Disclaimer: This note was dictated with voice recognition software. Similar sounding words can inadvertently be transcribed and may not be corrected upon review.

## 2018-06-26 ENCOUNTER — Other Ambulatory Visit: Payer: Self-pay

## 2018-06-26 NOTE — Patient Outreach (Signed)
[facility]) Care Management  06/26/2018  Kimberly [name] Jan 14, 1941 [id number]    1st outreach attempt to the patient for assessment. No answer. HIPAA compliant voicemail left with contact information.  Plan:  RN Health Coach will make an outreach attempt to the patient within thirty business days.  [name] RN, BSN, [place] Direct Dial:  [phone]  Fax: [phone]

## 2018-07-08 ENCOUNTER — Encounter: Payer: Self-pay | Admitting: Pulmonary Disease

## 2018-07-08 ENCOUNTER — Ambulatory Visit: Payer: Medicare Other | Admitting: Pulmonary Disease

## 2018-07-08 VITALS — BP 130/70 | HR 75 | Ht <= 58 in | Wt 151.2 lb

## 2018-07-08 DIAGNOSIS — C3490 Malignant neoplasm of unspecified part of unspecified bronchus or lung: Secondary | ICD-10-CM | POA: Diagnosis not present

## 2018-07-08 DIAGNOSIS — C342 Malignant neoplasm of middle lobe, bronchus or lung: Secondary | ICD-10-CM | POA: Diagnosis not present

## 2018-07-08 DIAGNOSIS — R059 Cough, unspecified: Secondary | ICD-10-CM

## 2018-07-08 DIAGNOSIS — J301 Allergic rhinitis due to pollen: Secondary | ICD-10-CM

## 2018-07-08 DIAGNOSIS — K219 Gastro-esophageal reflux disease without esophagitis: Secondary | ICD-10-CM | POA: Diagnosis not present

## 2018-07-08 DIAGNOSIS — J701 Chronic and other pulmonary manifestations due to radiation: Secondary | ICD-10-CM

## 2018-07-08 DIAGNOSIS — R05 Cough: Secondary | ICD-10-CM

## 2018-07-08 DIAGNOSIS — IMO0002 Reserved for concepts with insufficient information to code with codable children: Secondary | ICD-10-CM

## 2018-07-08 MED ORDER — HYDROCOD POLST-CPM POLST ER 10-8 MG/5ML PO SUER: 5.0000 mL | Freq: Two times a day (BID) | ORAL | 0 refills | Status: DC | PRN

## 2018-07-08 NOTE — Patient Instructions (Signed)
Cyclical cough or irritable larynx syndrome: You need to try to suppress the cough is much as possible Take Tussionex twice a day as needed I strongly recommend that you pick 3 days to rest your voice so that she can allow your vocal cords to heal Use Milta Deiters Med rinses with distilled water at least twice per day using the instructions on the package. 1/2 hour after using the [facility] Med rinse, use Nasacort two puffs in each nostril once per day.  Remember that the Nasacort can take 1-2 weeks to work after regular use. Use generic zyrtec (cetirizine) every day.  If this doesn't help, then stop taking it and use chlorpheniramine-phenylephrine combination tablets.  Radiation fibrosis: I will reach out to Dr. Earlie Server to see if it is possible for me to treat you with an extended course of prednisone for about 3 to 4 weeks  Gastroesophageal reflux disease: Continue taking Pantoprazole twice a day Continue following the lifestyle modification sheet we gave you last visit  Allergic rhinitis: Continue Singulair Continue Flonase Use over-the-counter Claritin once daily  We will see you back in 4 weeks with either me or a nurse practitioner

## 2018-07-08 NOTE — Progress Notes (Signed)
Synopsis: Referred in May 2019 for cough in the setting of squamous cell carcinoma of the lung.  She had a VATS biopsy and mediastinal exploration in March 2015.  Tumor was found to be unresectable at that time.  Underwent concurrent chemoradiation starting in 2015.  Had consolidation chemotherapy with carboplatin and paclitaxel in June 2015.  Underwent systemic chemotherapy with carboplatin and paclitaxel in late 2017 through early 2018.  As of her first visit with the pulmonary clinic in May 2019 she was currently on immunotherapy with Nivolumab   Subjective:   PATIENT ID: Kimberly [name] GENDER: female DOB: [birth year], MRN: [id number]   HPI  Chief Complaint  Patient presents with  . Follow-up    acid reflux better but cough is not better-cough with any change in environment and cough so much she can not breath    Teriyah says that her postnasal drip is still a problem despite taking Singulair and Flonase regularly.  She is not taking Claritin.  She says that the gastroesophageal reflux disease is gone since she changed her diet and is taking the medications we recommended after the last visit.  However, she continues to cough.  She says that the cough can be quite intense.  Its particularly worse when she goes in and out of buildings or if she is around chemical fumes such as cleaning chemicals.  She does not have problems with mucus production, she has not been sick with bronchitis and she is not short of breath.    Past Medical History:  Diagnosis Date  . Allergic asthma without acute exacerbation or status asthmaticus   . Aortic insufficiency   . Arthritis   . Atrial fibrillation ([place])   . Back pain 08/14/2016  . Bronchitis   . Cough    dry, endobronchial mass  . Dyslipidemia   . Family history of adverse reaction to anesthesia    pts son also experiences N/V  . GERD (gastroesophageal reflux disease)   . Heart murmur   . History of blood transfusion   . History of  bronchitis   . History of chemotherapy   . History of nuclear stress test 02/26/2006   exercise myoview; normal pattern of perfusion; low risk scan   . Hypertension   . Hypothyroidism   . Mitral insufficiency   . Pneumonia   . PONV (postoperative nausea and vomiting)   . Radiation 12/01/13-01/08/14   50.4 gray to right central chest  . Renal mass, left 04/12/2017  . Shortness of breath    with exertion  . Squamous cell carcinoma of lung ([place])   . Syncope    "states she has passed out a few times. dr is trying to find cause.last time when geeting ready to go home after video bronch/bx  . Thyroid disease       Review of Systems  Constitutional: Positive for malaise/fatigue. Negative for chills, fever and weight loss.  HENT: Positive for congestion. Negative for nosebleeds, sinus pain and sore throat.   Eyes: Negative for photophobia, pain and discharge.  Respiratory: Positive for cough and sputum production. Negative for hemoptysis and wheezing.   Cardiovascular: Negative for chest pain, palpitations, orthopnea and leg swelling.  Gastrointestinal: Negative for abdominal pain, constipation, diarrhea, nausea and vomiting.  Genitourinary: Negative for dysuria, frequency, hematuria and urgency.  Musculoskeletal: Negative for back pain, joint pain, myalgias and neck pain.  Skin: Negative for itching and rash.  Neurological: Negative for tingling, tremors, sensory change, speech change, focal weakness, seizures,  weakness and headaches.  Psychiatric/Behavioral: Negative for memory loss, substance abuse and suicidal ideas. The patient is not nervous/anxious.       Objective:  Physical Exam   Vitals:   07/08/18 1411  BP: 130/70  Pulse: 75  SpO2: 98%  Weight: 151 lb 3.2 oz (68.6 kg)  Height: 4\' 10"  (1.473 m)    Gen: well appearing HENT: OP clear, TM's clear, neck supple PULM: Few crackles R base B, normal percussion CV: RRR, no mgr, trace edema GI: BS+, soft, nontender Derm: no  cyanosis or rash Psyche: normal mood and affect    CBC    Component Value Date/Time   WBC 7.3 06/13/2018 0809   WBC 7.4 10/15/2017 1116   RBC 3.37 (L) 06/13/2018 0809   HGB 10.5 (L) 06/13/2018 0809   HGB 11.3 (L) 07/12/2017 1212   HCT 32.0 (L) 06/13/2018 0809   HCT 33.1 (L) 07/12/2017 1212   PLT 257 06/13/2018 0809   PLT 251 07/12/2017 1212   MCV 95.0 06/13/2018 0809   MCV 101.9 (H) 07/12/2017 1212   MCH 31.2 06/13/2018 0809   MCHC 32.8 06/13/2018 0809   RDW 12.7 06/13/2018 0809   RDW 12.3 07/12/2017 1212   LYMPHSABS 0.8 06/13/2018 0809   LYMPHSABS 1.4 07/12/2017 1212   MONOABS 0.7 06/13/2018 0809   MONOABS 0.6 07/12/2017 1212   EOSABS 0.3 06/13/2018 0809   EOSABS 0.2 07/12/2017 1212   BASOSABS 0.1 06/13/2018 0809   BASOSABS 0.1 07/12/2017 1212     Chest imaging: May 2019 CT chest images independently reviewed showing right hilar mass, what appears to be 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  PFT: 2015 lung function testing reviewed where there was no airflow obstruction, no diffusion abnormalities.  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  That being said, I think there is some degree of radiation fibrosis contributing as well.  I would like to treat her with prednisone for this but she tells me that apparently there is some concern with using Opdivo and prednisone so I will reach out to her oncologist prior to starting this medicine.  I think the best approach moving forward is to focus on voice rest because of ongoing laryngeal  irritation contributing to the severity of her cough.  Plan: Cyclical cough or irritable larynx syndrome: You need to try to suppress the cough is much as possible Take Tussionex twice a day as needed, don't take this and drive because it can be sedating I strongly recommend that you pick 3 days to rest your voice so that she can allow your vocal cords to heal Use Milta Deiters Med rinses with distilled water at least twice per day using the instructions on the package. 1/2 hour after using the [facility] Med rinse, use Nasacort two puffs in each nostril once per day.  Remember that the Nasacort can take 1-2 weeks to work after regular use. Use generic zyrtec (cetirizine) every day.  If this doesn't help, then stop taking it and use chlorpheniramine-phenylephrine combination tablets.  Radiation fibrosis: I will reach out to Dr. Earlie Server to see if it is  possible for me to treat you with an extended course of prednisone for about 3 to 4 weeks  Gastroesophageal reflux disease: Continue taking Pantoprazole twice a day Continue following the lifestyle modification sheet we gave you last visit  Allergic rhinitis: Continue Singulair Continue Flonase Use over-the-counter Claritin once daily  We will see you back in 4 weeks with either me or a nurse practitioner    Current Outpatient Medications:  .  albuterol (PROVENTIL HFA;VENTOLIN HFA) 108 (90 BASE) MCG/ACT inhaler, Inhale 1 puff into the lungs every 6 (six) hours as needed for wheezing or shortness of breath. , Disp: , Rfl:  .  amLODipine (NORVASC) 5 MG tablet, Take 5 mg by mouth daily., Disp: , Rfl: 3 .  atorvastatin (LIPITOR) 20 MG tablet, Take 20 mg by mouth every morning. , Disp: , Rfl:  .  chlorthalidone (HYGROTON) 25 MG tablet, TAKE 1 TABLET BY MOUTH  DAILY, Disp: 90 tablet, Rfl: 0 .  fluticasone (FLONASE) 50 MCG/ACT nasal spray, Place 2 sprays into both nostrils daily., Disp: , Rfl:  .  guaiFENesin-dextromethorphan (ROBITUSSIN DM) 100-10 MG/5ML  syrup, Take 5 mLs by mouth at bedtime., Disp: , Rfl:  .  KLOR-CON 8 MEQ tablet, Take 8 mEq every other day by mouth., Disp: , Rfl: 3 .  levothyroxine (SYNTHROID, LEVOTHROID) 100 MCG tablet, Take 100 mcg daily by mouth., Disp: , Rfl:  .  lidocaine-prilocaine (EMLA) cream, Apply 1 application topically as needed. (Patient taking differently: Apply 1 application topically as needed (for port access). ), Disp: 30 g, Rfl: 0 .  metoprolol succinate (TOPROL-XL) 25 MG 24 hr tablet, Take 25 mg by mouth daily., Disp: , Rfl:  .  mirabegron ER (MYRBETRIQ) 50 MG TB24 tablet, Take 50 mg by mouth daily., Disp: , Rfl:  .  montelukast (SINGULAIR) 10 MG tablet, Take 10 mg by mouth at bedtime. , Disp: , Rfl:  .  Multiple Vitamins-Iron (MULTIVITAMIN/IRON PO), Take 1 tablet by mouth daily with breakfast. , Disp: , Rfl:  .  pantoprazole (PROTONIX) 40 MG tablet, TAKE 1 TABLET BY MOUTH TWO  TIMES DAILY, Disp: 120 tablet, Rfl: 0 .  prochlorperazine (COMPAZINE) 10 MG tablet, Take 1 tablet (10 mg total) by mouth every 6 (six) hours as needed for nausea or vomiting., Disp: 30 tablet, Rfl: 0 .  traMADol (ULTRAM) 50 MG tablet, Take 50 mg every 8 (eight) hours as needed by mouth. Take 1/2 tablet, Disp: , Rfl: 3 .  chlorpheniramine-HYDROcodone (TUSSIONEX PENNKINETIC ER) 10-8 MG/5ML SUER, Take 5 mLs by mouth every 12 (twelve) hours as needed for cough., Disp: 140 mL, Rfl: 0

## 2018-07-09 ENCOUNTER — Encounter: Payer: Self-pay | Admitting: Internal Medicine

## 2018-07-09 ENCOUNTER — Ambulatory Visit (INDEPENDENT_AMBULATORY_CARE_PROVIDER_SITE_OTHER): Payer: Medicare Other | Admitting: Internal Medicine

## 2018-07-09 ENCOUNTER — Telehealth: Payer: Self-pay | Admitting: Pulmonary Disease

## 2018-07-09 VITALS — BP 126/64 | HR 77 | Ht 58.5 in | Wt 151.0 lb

## 2018-07-09 DIAGNOSIS — C342 Malignant neoplasm of middle lobe, bronchus or lung: Secondary | ICD-10-CM | POA: Diagnosis not present

## 2018-07-09 DIAGNOSIS — I480 Paroxysmal atrial fibrillation: Secondary | ICD-10-CM

## 2018-07-09 DIAGNOSIS — I10 Essential (primary) hypertension: Secondary | ICD-10-CM

## 2018-07-09 NOTE — Telephone Encounter (Signed)
Called and spoke to pt, who is requesting alternative for Tussionex, as this medication is not covered by insurance.  BQ please advise. Thanks

## 2018-07-09 NOTE — Telephone Encounter (Signed)
Called and spoke to [place] with [facility], who states that Kimberly [name] is covered.   BQ please advise. Thanks

## 2018-07-09 NOTE — Progress Notes (Signed)
OFFICE NOTE  Chief Complaint:  Follow-up  Primary Care Physician: Deland Pretty, MD  HPI:  Kimberly [name] is a 77 year old female who recently has been having some chest discomfort and shortness of breath as well as 2 episodes of syncope, both of which were during stressful situations, once while on a ladder. We went ahead and checked an echocardiogram which essentially shows only mild abnormalities. She did have a loudly calcified aortic valve which was not stenotic; however, there was mild regurgitation. There was mild LVH with EF 55% to 60% and grade 1 diastolic dysfunction. She had borderline pulmonary hypertension. A monitor was also worn by her from September 13, 2012 to October 12, 2012. This showed a predominantly sinus rhythm with PACs. There was, however, 1 episode of a nonsustained ventricular tachycardia which was about 5 beats. Of course she potentially would have to had longer episodes of NSVT this could have been playing a role in her syncopal event. I do not think she, however, is at high risk for lethal sustained VT given her normal ejection fraction and no clear evidence of ischemia. As you recall, she had a low-risk MET-test on April 02, 2012, with a peak VO2 of 77% predicted without any evidence of an ischemic response. This would make it quite unlikely that she has underlying coronary blockage that is responsible for her episode. I have, however, recommended adding a beta blocker to her regimen which will additionally help control her recent uncontrolled hypertension. This is Toprol XL 12.5 mg daily. It should help also with the VT. she is continuing to take low-dose Toprol and has had no further episodes of passing out in the past 6 months. She was thoroughly evaluated by a neurologist and felt to possibly be having a vasodepressive syncope. There was lower extremity.swelling and compression stockings were recommended.  Kimberly [name] returns today for followup. She has been  hospitalized a number of times since getting a diagnosis of lung cancer. 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 Unfortunately she developed A. fib with RVR and was treated and placed on amiodarone. She's been having problems with recurrent vasovagal syncope and hypotension. She does have a mass that is pressing against the atrium.  I saw Kimberly [name] back in the office today. She is undergoing chemotherapy which is cause some mild shrinkage of her tumor however it is not disappeared, and apparently is incurable and unresectable. Fortunate she's had no recurrence of her A. fib and remains on low-dose amiodarone. She says that she feels better than she had when she was undergoing radiation chemotherapy. She has no new complaints.  Kimberly [name] returns today for follow-up. She seems to be holding her own with regards for lung cancer. She has another 4 month reprieve to see her oncologist. Her most recent scan showed a slight increase in the size of her cancer with another small tumor but she seems to be taking this pretty well. She's not had recurrent A. fib from what I can tell and maintains on low-dose amiodarone. She is due for repeat lipid testing as well as surveillance thyroid testing.   Kimberly [name] returns today for follow-up. She denies any chest pain although does get short of breath with some activities. She reports that her lung cancer is still growing at a slow rate however  She was intolerant of extensive chemotherapy and is now on a chemotherapy break. Recently I received notification from her optometrist that she has  some amiodarone deposits in her eyes and therefore he recommended discontinuing her medication if it was feasible. Since she's not had A. Fib in some time I thought it would be reasonable to take her off the medicine to see if there would be any recurrence. She's had some vision loss unrelated to amiodarone and we did not like to see that adding to  it.  05/03/2016  Kimberly [name] returns today for follow-up. She says that recently she's continued to be fatigued. She does not relate this to her recent lung cancer and chemotherapy. She stopped taking atorvastatin due to muscle aches and reports some improvement and no symptoms. She is concerned about cardiac causes of her symptoms including coronary artery disease. Blood pressure appears well controlled today. EKG shows a sinus rhythm with poor anterior R-wave progression and 82.  06/05/2016  Kimberly [name] was seen today in follow-up. She underwent nuclear stress testing which was negative for ischemia and showed normal LV function. I reassured her that I did not feel that her fatigue is related to worsening coronary artery disease.  01/01/2017  Kimberly [name] returns today for follow-up. Unfortunately she's had some recurrent cancer. She underwent another round of chemotherapy. After this chemotherapy she was noted to be more short of breath and fatigues much easier. She's also had some worsening dependent edema. She says the swelling typically goes down in the morning and is swollen by the end of the day when she's been up on her feet. She recently had a stress test which was negative for ischemia and showed normal LV function.  01/02/2018  Kimberly [name] was seen today in follow-up.  She continues to battle lung cancer.  She reports that she has been more short of breath recently.  The shortness of breath is associated with cough as well.  The cough is been somewhat significant.  She gets into fits of cough, particularly at night for which she feels like her heart might stop.  She wondered if it could be related to her medications.  She is on an ARB but not an ACE inhibitor.  There is a small likelihood that could be playing a role in her cough, however I suspect it may be lung parenchymal disease or related to her lung cancer.  She does not see a pulmonologist.  Labs as of October 2018 showed total cholesterol 162, HDL 49, LDL 78  and triglycerides 174.  07/09/2018  Kimberly [name] is seen today in follow-up.  She has been followed by Dr. Lake Bells and saw him yesterday.  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  This is dry but somewhat irritating for her.  She is on Opdivo.  She is scheduled for repeat CT scan tomorrow and follow-up with Dr. Earlie Server in 2 days.  She denies any recurrent A. fib or chest pain.  She does struggle with shortness of breath with exertion.  PMHx:  Past Medical History:  Diagnosis Date  . Allergic asthma without acute exacerbation or status asthmaticus   . Aortic insufficiency   . Arthritis   . Atrial fibrillation ([place])   . Back pain 08/14/2016  . Bronchitis   . Cough    dry, endobronchial mass  . Dyslipidemia   . Family history of adverse reaction to anesthesia    pts son also experiences N/V  . GERD (gastroesophageal reflux disease)   . Heart murmur   . History of blood transfusion   . History of bronchitis   .  History of chemotherapy   . History of nuclear stress test 02/26/2006   exercise myoview; normal pattern of perfusion; low risk scan   . Hypertension   . Hypothyroidism   . Mitral insufficiency   . Pneumonia   . PONV (postoperative nausea and vomiting)   . Radiation 12/01/13-01/08/14   50.4 gray to right central chest  . Renal mass, left 04/12/2017  . Shortness of breath    with exertion  . 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 Syncope    "states she has passed out a few times. dr is trying to find cause.last time when geeting ready to go home after video bronch/bx  . Thyroid disease     Past Surgical History:  Procedure Laterality Date  . CARDIAC CATHETERIZATION  07/08/2008   normal coronaries  . CARPAL TUNNEL RELEASE Right 1988  . COLONOSCOPY N/A 02/11/2016   Procedure: COLONOSCOPY;  Surgeon: Carol Ada, MD;  Location: [facility];  Service: Endoscopy;  Laterality: N/A;  . DILATION AND  CURETTAGE OF UTERUS    . ESOPHAGOGASTRODUODENOSCOPY N/A 02/08/2017   Procedure: ESOPHAGOGASTRODUODENOSCOPY (EGD);  Surgeon: Carol Ada, MD;  Location: [facility];  Service: Endoscopy;  Laterality: N/A;  . EYE SURGERY Bilateral   . H/O MET Test w/PFT  04/02/2012   low risk; peak VO2 77% predicted  . IR GENERIC HISTORICAL  09/12/2016   IR FLUORO GUIDE PORT INSERTION RIGHT 09/12/2016 [name], MD [facility]  . IR GENERIC HISTORICAL  09/12/2016   IR US GUIDE VASC ACCESS RIGHT 09/12/2016 [name], MD [facility]  . JOINT REPLACEMENT  2003   thumb rt  . KNEE ARTHROSCOPY  12   rt meniscus  . MEDIASTINOSCOPY N/A 10/30/2013   Procedure: MEDIASTINOSCOPY;  Surgeon: Melrose Nakayama, MD;  Location: [facility];  Service: Thoracic;  Laterality: N/A;  . ROTATOR CUFF REPAIR  2006   ? side  . THYROIDECTOMY  1973  . TRANSTHORACIC ECHOCARDIOGRAM  09/25/2012   EF 55-60%; mild LVH & mild concentric hypertrophy; mild AV regurg; RV systolic pressure increase consistent with mild pulm HTN  . VIDEO ASSISTED THORACOSCOPY (VATS)/ LOBECTOMY Right 11/13/2013   Procedure: VIDEO ASSISTED THORACOSCOPY (VATS) with mediastinal  biopsies;  Surgeon: Melrose Nakayama, MD;  Location: [facility];  Service: Thoracic;  Laterality: Right;  RIGHT VATS,mediastinal biopsies  . VIDEO BRONCHOSCOPY Bilateral 09/25/2013   Procedure: VIDEO BRONCHOSCOPY WITHOUT FLUORO;  Surgeon: Tanda Rockers, MD;  Location: [facility];  Service: Cardiopulmonary;  Laterality: Bilateral;  . VIDEO BRONCHOSCOPY WITH ENDOBRONCHIAL ULTRASOUND N/A 10/30/2013   Procedure: VIDEO BRONCHOSCOPY WITH ENDOBRONCHIAL ULTRASOUND;  Surgeon: Melrose Nakayama, MD;  Location[facility];  Service: Thoracic;  Laterality: N/A;    FAMHx:  Family History  Problem Relation Age of Onset  . Lung cancer Mother   . Heart attack Father   . Heart failure Maternal Grandfather   . Heart attack Paternal Grandfather     SOCHx:   reports that she quit smoking about 50  years ago. Her smoking use included cigarettes. She has a 3.00 pack-year smoking history. She has never used smokeless tobacco. She reports that she drinks alcohol. She reports that she does not use drugs.  ALLERGIES:  Allergies  Allergen Reactions  . Lactose Intolerance (Gi) Other (See Comments)  . Amiodarone Other (See Comments)    Corneal deposits  . Augmentin [Amoxicillin-Pot Clavulanate] Diarrhea    *ended up with cdiff* Has patient had a PCN reaction causing immediate rash, facial/tongue/throat swelling, SOB  or lightheadedness with hypotension: No Has patient had a PCN reaction causing severe rash involving mucus membranes or skin necrosis: No Has patient had a PCN reaction that required hospitalization: Yes Has patient had a PCN reaction occurring within the last 10 years: Yes If all of the above answers are "NO", then may proceed with Cephalosporin use.   . Milk-Related Compounds Other (See Comments)    GI upset, projectile vomiting - can't take any dairy products    ROS: Pertinent items noted in HPI and remainder of comprehensive ROS otherwise negative.  HOME MEDS: Current Outpatient Medications  Medication Sig Dispense Refill  . albuterol (PROVENTIL HFA;VENTOLIN HFA) 108 (90 BASE) MCG/ACT inhaler Inhale 1 puff into the lungs every 6 (six) hours as needed for wheezing or shortness of breath.     [name] amLODipine (NORVASC) 5 MG tablet Take 5 mg by mouth daily.  3  . atorvastatin (LIPITOR) 20 MG tablet Take 20 mg by mouth every morning.     . chlorpheniramine-HYDROcodone (TUSSIONEX PENNKINETIC ER) 10-8 MG/5ML SUER Take 5 mLs by mouth every 12 (twelve) hours as needed for cough. 140 mL 0  . chlorthalidone (HYGROTON) 25 MG tablet TAKE 1 TABLET BY MOUTH  DAILY 90 tablet 0  . fluticasone (FLONASE) 50 MCG/ACT nasal spray Place 2 sprays into both nostrils daily.    [name] KLOR-CON 8 MEQ tablet Take 8 mEq every other day by mouth.  3  . levothyroxine (SYNTHROID, LEVOTHROID) 100 MCG tablet  Take 100 mcg daily by mouth.    . lidocaine-prilocaine (EMLA) cream Apply 1 application topically as needed. (Patient taking differently: Apply 1 application topically as needed (for port access). ) 30 g 0  . metoprolol succinate (TOPROL-XL) 25 MG 24 hr tablet Take 25 mg by mouth daily.    . mirabegron ER (MYRBETRIQ) 50 MG TB24 tablet Take 50 mg by mouth daily.    . montelukast (SINGULAIR) 10 MG tablet Take 10 mg by mouth at bedtime.     . Multiple Vitamins-Iron (MULTIVITAMIN/IRON PO) Take 1 tablet by mouth daily with breakfast.     . pantoprazole (PROTONIX) 40 MG tablet TAKE 1 TABLET BY MOUTH TWO  TIMES DAILY 120 tablet 0  . prochlorperazine (COMPAZINE) 10 MG tablet Take 1 tablet (10 mg total) by mouth every 6 (six) hours as needed for nausea or vomiting. 30 tablet 0  . traMADol (ULTRAM) 50 MG tablet Take 50 mg every 8 (eight) hours as needed by mouth. Take 1/2 tablet  3   No current facility-administered medications for this visit.     LABS/IMAGING: No results found for this or any previous visit (from the past 48 hour(s)). No results found.  VITALS: BP 126/64 (BP Location: Left Arm, Patient Position: Sitting, Cuff Size: Normal)   Pulse 77   Ht 4' 10.5" (1.486 m)   Wt 151 lb (68.5 kg)   BMI 31.02 kg/m   EXAM: General appearance: alert and mild distress Neck: no carotid bruit, no JVD and thyroid not enlarged, symmetric, no tenderness/mass/nodules Lungs: diminished breath sounds bilaterally Heart: regular rate and rhythm Abdomen: soft, non-tender; bowel sounds normal; no masses,  no organomegaly Extremities: extremities normal, atraumatic, no cyanosis or edema Pulses: 2+ and symmetric Skin: Pale, warm, dry Neurologic: Grossly normal Psych: Pleasant  EKG: Normal sinus rhythm 77, low voltage QRS-personally reviewed  ASSESSMENT: 1. Progressive fatigue and dyspnea - low risk myoview stress test (EF 60%) - 05/2016 2. Persistent cough, mostly at night 3. Lung cancer (non-small  cell stage  III) 4. Recurrent vasodepressor syncope  5. Lower extremity edema  6. Hypertension 7. Paroxysmal atrial fibrillation - no recurrence, brief and related to para-atrial tumor  PLAN: 1.   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  She is now on immunotherapy for her lung cancer and is scheduled for follow-up CT scan tomorrow and to see her oncologist this week.  She is being prescribed some cough suppressants by her pulmonologist.  She said no recurrent A. fib.  Her blood pressure is well controlled.  No changes made to her medicines today.  Follow-up with me in 6 months or sooner as necessary.  Pixie Casino, MD, [facility], [place] Director of the Advanced Lipid Disorders &  Cardiovascular Risk Reduction Clinic Diplomate of the American Board of Clinical Lipidology Attending Cardiologist  Direct Dial: [phone]  Fax: [phone]  Website:  [email or web address] 07/09/2018, 2:37 PM

## 2018-07-09 NOTE — Telephone Encounter (Signed)
Would it be possible for her pharmacist to let us know what is covered? Can she check her formulary?

## 2018-07-09 NOTE — Patient Instructions (Signed)
Medication Instructions:  Continue current medications If you need a refill on your cardiac medications before your next appointment, please call your pharmacy.   Follow-Up: At [facility], you and your health needs are our priority.  As part of our continuing mission to provide you with exceptional heart care, we have created designated Provider Care Teams.  These Care Teams include your primary Cardiologist (physician) and Advanced Practice Providers (APPs -  Physician Assistants and Nurse Practitioners) who all work together to provide you with the care you need, when you need it. You will need a follow up appointment in 6 months.  Please call our office 2 months in advance to schedule this appointment.  You may see Dr. Debara Pickett or one of the following Advanced Practice Providers on your designated Care Team: Almyra Deforest, [place] . Fabian Sharp, PA-C  Any Other Special Instructions Will Be Listed Below (If Applicable).

## 2018-07-10 ENCOUNTER — Encounter (HOSPITAL_COMMUNITY): Payer: Self-pay

## 2018-07-10 ENCOUNTER — Ambulatory Visit (HOSPITAL_COMMUNITY)
Admission: RE | Admit: 2018-07-10 | Discharge: 2018-07-10 | Disposition: A | Discharge status: Home / Self Care | Payer: Medicare Other | Source: Ambulatory Visit | Attending: Internal Medicine | Admitting: Internal Medicine

## 2018-07-10 DIAGNOSIS — C3490 Malignant neoplasm of unspecified part of unspecified bronchus or lung: Secondary | ICD-10-CM | POA: Diagnosis not present

## 2018-07-10 DIAGNOSIS — N289 Disorder of kidney and ureter, unspecified: Secondary | ICD-10-CM | POA: Diagnosis not present

## 2018-07-10 DIAGNOSIS — I700 Atherosclerosis of aorta: Secondary | ICD-10-CM | POA: Diagnosis not present

## 2018-07-10 DIAGNOSIS — K449 Diaphragmatic hernia without obstruction or gangrene: Secondary | ICD-10-CM | POA: Insufficient documentation

## 2018-07-10 DIAGNOSIS — N2889 Other specified disorders of kidney and ureter: Secondary | ICD-10-CM | POA: Diagnosis not present

## 2018-07-10 DIAGNOSIS — J432 Centrilobular emphysema: Secondary | ICD-10-CM | POA: Diagnosis not present

## 2018-07-10 DIAGNOSIS — J439 Emphysema, unspecified: Secondary | ICD-10-CM | POA: Diagnosis not present

## 2018-07-10 DIAGNOSIS — I2510 Atherosclerotic heart disease of native coronary artery without angina pectoris: Secondary | ICD-10-CM | POA: Insufficient documentation

## 2018-07-10 MED ORDER — IOHEXOL 300 MG/ML  SOLN
100.0000 mL | Freq: Once | INTRAMUSCULAR | Status: AC | PRN
  Administered 2018-07-10: 100 mL via INTRAVENOUS

## 2018-07-10 MED ORDER — SODIUM CHLORIDE (PF) 0.9 % IJ SOLN
INTRAMUSCULAR | Status: AC
  Filled 2018-07-10: qty 50

## 2018-07-10 MED ORDER — HEPARIN SOD (PORK) LOCK FLUSH 100 UNIT/ML IV SOLN: 500.0000 [IU] | Freq: Once | INTRAVENOUS | Status: DC

## 2018-07-10 MED ORDER — HEPARIN SOD (PORK) LOCK FLUSH 100 UNIT/ML IV SOLN
INTRAVENOUS | Status: AC
  Filled 2018-07-10: qty 5

## 2018-07-10 MED ORDER — HEPARIN SOD (PORK) LOCK FLUSH 100 UNIT/ML IV SOLN
500.0000 [IU] | Freq: Once | INTRAVENOUS | Status: AC
  Administered 2018-07-10: 500 [IU] via INTRAVENOUS

## 2018-07-10 NOTE — Telephone Encounter (Signed)
Called and spoke with patient, she stated that she is going to call her insurance and see what is covered and call us back.

## 2018-07-10 NOTE — Telephone Encounter (Signed)
Pt is calling back [phone]

## 2018-07-10 NOTE — Telephone Encounter (Signed)
Called and spoke with patient she does not want to take tessalon as it has not worked in the past.   BQ please advise, thank you.

## 2018-07-10 NOTE — Telephone Encounter (Signed)
Tessalon 200mg  poq8h prn cough disp 45 refill 1  Please check with the patient to make sure she is OK with this, she may have already tried this medicine.

## 2018-07-10 NOTE — Telephone Encounter (Signed)
lmtcb for pt to relay below message.

## 2018-07-10 NOTE — Telephone Encounter (Signed)
She will have to decide if she wants to pay for Tussionex then.   If there are other covered alternatives from her insurance formulary then I'm happy to prescribe those.

## 2018-07-11 ENCOUNTER — Ambulatory Visit: Payer: Medicare Other

## 2018-07-11 ENCOUNTER — Ambulatory Visit: Payer: Medicare Other | Admitting: Internal Medicine

## 2018-07-11 ENCOUNTER — Inpatient Hospital Stay: Payer: Medicare Other | Attending: Internal Medicine

## 2018-07-11 ENCOUNTER — Encounter: Payer: Self-pay | Admitting: Internal Medicine

## 2018-07-11 ENCOUNTER — Inpatient Hospital Stay: Payer: Medicare Other

## 2018-07-11 ENCOUNTER — Inpatient Hospital Stay (HOSPITAL_BASED_OUTPATIENT_CLINIC_OR_DEPARTMENT_OTHER): Payer: Medicare Other | Admitting: Internal Medicine

## 2018-07-11 ENCOUNTER — Telehealth: Payer: Self-pay | Admitting: Internal Medicine

## 2018-07-11 ENCOUNTER — Other Ambulatory Visit: Payer: Medicare Other

## 2018-07-11 VITALS — BP 137/79 | HR 95 | Temp 98.7°F | Resp 14 | Ht <= 58 in | Wt 150.7 lb

## 2018-07-11 DIAGNOSIS — Z5112 Encounter for antineoplastic immunotherapy: Secondary | ICD-10-CM | POA: Insufficient documentation

## 2018-07-11 DIAGNOSIS — R0609 Other forms of dyspnea: Secondary | ICD-10-CM | POA: Diagnosis not present

## 2018-07-11 DIAGNOSIS — R05 Cough: Secondary | ICD-10-CM | POA: Diagnosis not present

## 2018-07-11 DIAGNOSIS — C342 Malignant neoplasm of middle lobe, bronchus or lung: Secondary | ICD-10-CM | POA: Insufficient documentation

## 2018-07-11 DIAGNOSIS — E860 Dehydration: Secondary | ICD-10-CM

## 2018-07-11 DIAGNOSIS — Z79899 Other long term (current) drug therapy: Secondary | ICD-10-CM | POA: Diagnosis not present

## 2018-07-11 DIAGNOSIS — I10 Essential (primary) hypertension: Secondary | ICD-10-CM

## 2018-07-11 DIAGNOSIS — R0602 Shortness of breath: Secondary | ICD-10-CM

## 2018-07-11 DIAGNOSIS — C3411 Malignant neoplasm of upper lobe, right bronchus or lung: Secondary | ICD-10-CM

## 2018-07-11 DIAGNOSIS — T451X5A Adverse effect of antineoplastic and immunosuppressive drugs, initial encounter: Secondary | ICD-10-CM

## 2018-07-11 DIAGNOSIS — D6481 Anemia due to antineoplastic chemotherapy: Secondary | ICD-10-CM

## 2018-07-11 LAB — CMP (CANCER CENTER ONLY)
ALK PHOS: 165 U/L — AB (ref 38–126)
ALT: 13 U/L (ref 0–44)
ANION GAP: 10 (ref 5–15)
AST: 15 U/L (ref 15–41)
Albumin: 3.3 g/dL — ABNORMAL LOW (ref 3.5–5.0)
BILIRUBIN TOTAL: 0.5 mg/dL (ref 0.3–1.2)
BUN: 14 mg/dL (ref 8–23)
CALCIUM: 9.3 mg/dL (ref 8.9–10.3)
CO2: 29 mmol/L (ref 22–32)
CREATININE: 1 mg/dL (ref 0.44–1.00)
Chloride: 101 mmol/L (ref 98–111)
GFR, Est AFR Am: >60 mL/min (ref >60)
GFR, Estimated: 54 mL/min — ABNORMAL LOW (ref >60)
GLUCOSE: 128 mg/dL — AB (ref 70–99)
Potassium: 3.7 mmol/L (ref 3.5–5.1)
Sodium: 140 mmol/L (ref 135–145)
TOTAL PROTEIN: 6.9 g/dL (ref 6.5–8.1)

## 2018-07-11 LAB — CBC WITH DIFFERENTIAL (CANCER CENTER ONLY)
Abs Immature Granulocytes: 0.03 10*3/uL (ref 0.00–0.07)
BASOS PCT: 1 %
Basophils Absolute: 0 10*3/uL (ref 0.0–0.1)
EOS ABS: 0.4 10*3/uL (ref 0.0–0.5)
EOS PCT: 5 %
HEMATOCRIT: 32 % — AB (ref 36.0–46.0)
Hemoglobin: 10.7 g/dL — ABNORMAL LOW (ref 12.0–15.0)
Immature Granulocytes: 0 %
Lymphocytes Relative: 15 %
Lymphs Abs: 1.3 10*3/uL (ref 0.7–4.0)
MCH: 31.8 pg (ref 26.0–34.0)
MCHC: 33.4 g/dL (ref 30.0–36.0)
MCV: 95.2 fL (ref 80.0–100.0)
MONOS PCT: 6 %
Monocytes Absolute: 0.5 10*3/uL (ref 0.1–1.0)
NRBC: 0 % (ref 0.0–0.2)
Neutro Abs: 6.4 10*3/uL (ref 1.7–7.7)
Neutrophils Relative %: 73 %
PLATELETS: 284 10*3/uL (ref 150–400)
RBC: 3.36 MIL/uL — ABNORMAL LOW (ref 3.87–5.11)
RDW: 13 % (ref 11.5–15.5)
WBC Count: 8.7 10*3/uL (ref 4.0–10.5)

## 2018-07-11 LAB — TSH: TSH: 1.697 u[IU]/mL (ref 0.308–3.960)

## 2018-07-11 MED ORDER — SODIUM CHLORIDE 0.9 % IV SOLN
Freq: Once | INTRAVENOUS | Status: AC
  Administered 2018-07-11: 12:00:00 via INTRAVENOUS
  Filled 2018-07-11: qty 250

## 2018-07-11 MED ORDER — SODIUM CHLORIDE 0.9 % IV SOLN
480.0000 mg | Freq: Once | INTRAVENOUS | Status: AC
  Administered 2018-07-11: 480 mg via INTRAVENOUS
  Filled 2018-07-11: qty 48

## 2018-07-11 MED ORDER — SODIUM CHLORIDE 0.9% FLUSH
10.0000 mL | INTRAVENOUS | Status: DC | PRN
  Administered 2018-07-11: 10 mL
  Filled 2018-07-11: qty 10

## 2018-07-11 MED ORDER — HEPARIN SOD (PORK) LOCK FLUSH 100 UNIT/ML IV SOLN
500.0000 [IU] | Freq: Once | INTRAVENOUS | Status: AC | PRN
  Administered 2018-07-11: 500 [IU]
  Filled 2018-07-11: qty 5

## 2018-07-11 NOTE — Patient Instructions (Signed)
Implanted Port Home Guide An implanted port is a type of central line that is placed under the skin. Central lines are used to provide IV access when treatment or nutrition needs to be given through a person's veins. Implanted ports are used for long-term IV access. An implanted port may be placed because:  You need IV medicine that would be irritating to the small veins in your hands or arms.  You need long-term IV medicines, such as antibiotics.  You need IV nutrition for a long period.  You need frequent blood draws for lab tests.  You need dialysis.  Implanted ports are usually placed in the chest area, but they can also be placed in the upper arm, the abdomen, or the leg. An implanted port has two main parts:  Reservoir. The reservoir is round and will appear as a small, raised area under your skin. The reservoir is the part where a needle is inserted to give medicines or draw blood.  Catheter. The catheter is a thin, flexible tube that extends from the reservoir. The catheter is placed into a large vein. Medicine that is inserted into the reservoir goes into the catheter and then into the vein.  How will I care for my incision site? Do not get the incision site wet. Bathe or shower as directed by your health care provider. How is my port accessed? Special steps must be taken to access the port:  Before the port is accessed, a numbing cream can be placed on the skin. This helps numb the skin over the port site.  Your health care provider uses a sterile technique to access the port. ? Your health care provider must put on a mask and sterile gloves. ? The skin over your port is cleaned carefully with an antiseptic and allowed to dry. ? The port is gently pinched between sterile gloves, and a needle is inserted into the port.  Only "non-coring" port needles should be used to access the port. Once the port is accessed, a blood return should be checked. This helps ensure that the port  is in the vein and is not clogged.  If your port needs to remain accessed for a constant infusion, a clear (transparent) bandage will be placed over the needle site. The bandage and needle will need to be changed every week, or as directed by your health care provider.  Keep the bandage covering the needle clean and dry. Do not get it wet. Follow your health care provider's instructions on how to take a shower or bath while the port is accessed.  If your port does not need to stay accessed, no bandage is needed over the port.  What is flushing? Flushing helps keep the port from getting clogged. Follow your health care provider's instructions on how and when to flush the port. Ports are usually flushed with saline solution or a medicine called heparin. The need for flushing will depend on how the port is used.  If the port is used for intermittent medicines or blood draws, the port will need to be flushed: ? After medicines have been given. ? After blood has been drawn. ? As part of routine maintenance.  If a constant infusion is running, the port may not need to be flushed.  How long will my port stay implanted? The port can stay in for as long as your health care provider thinks it is needed. When it is time for the port to come out, surgery will be   done to remove it. The procedure is similar to the one performed when the port was put in. When should I seek immediate medical care? When you have an implanted port, you should seek immediate medical care if:  You notice a bad smell coming from the incision site.  You have swelling, redness, or drainage at the incision site.  You have more swelling or pain at the port site or the surrounding area.  You have a fever that is not controlled with medicine.  This information is not intended to replace advice given to you by your health care provider. Make sure you discuss any questions you have with your health care provider. Document  Released: 08/21/2005 Document Revised: 01/27/2016 Document Reviewed: 04/28/2013 Elsevier Interactive Patient Education  2017 [employer]  

## 2018-07-11 NOTE — Telephone Encounter (Signed)
Next 3 cycles already scheduled per 11/7 los. - no additional appts added.

## 2018-07-11 NOTE — Progress Notes (Signed)
[facility] [phone]   Fax:([phone]) [place], [place] [address] [place] [address]  DIAGNOSIS: Recurrent non-small cell lung cancer, squamous cell carcinoma initially diagnosed as unresectable a stage IIIa in February 2015.  PRIOR THERAPY: 1) Status post exploratory right VATS with mediastinal biopsy under the care of Dr. Roxan Hockey on 11/13/2013. The tumor was found to be unresectable at that time.[name] 2) Concurrent chemoradiation with weekly carboplatin for AUC of 2 and paclitaxel 45 mg/M2, status post 6 cycles with partial response. First cycle was given on 12/01/2013. 3) Consolidation chemotherapy with carboplatin for AUC of 5 and paclitaxel 175 mg/M2 every 3 weeks with Neulasta support. First dose 02/23/2014. Status post 3 cycles with partial response. 4) Systemic chemotherapy with carboplatin for AUC of 5 and paclitaxel 175 MG/M2 every 3 weeks with Neulasta support. Status post 6 cycles. First dose was given on 08/14/2016.  Last dose was giving 12/11/2016 with partial response.  CURRENT THERAPY: Second line treatment with immunotherapy with Nivolumab 480 mg IV every 4 weeks, first dose Jan 25, 2018.  Status post 6 cycles.  INTERVAL HISTORY: Kimberly [name] 77 y.o. female returns to the clinic today for follow-up visit.  The patient is feeling fine except for the dry cough.  She was seen recently by Dr. Lake Bells and he started her on Tussionex but she could not afford it.  She denied having any chest pain but has shortness of breath with exertion with no hemoptysis.  She denied having any significant weight loss or night sweats.  She has no nausea, vomiting, diarrhea or constipation.  She denied having any significant fever or chills.  She continues to tolerate her treatment with Nivolumab fairly well.  The patient had repeat CT scan of the chest performed recently and she is here for evaluation and  discussion of her risk her results before cycle #7.  MEDICAL HISTORY: Past Medical History:  Diagnosis Date  . Allergic asthma without acute exacerbation or status asthmaticus   . Aortic insufficiency   . Arthritis   . Atrial fibrillation ([place])   . Back pain 08/14/2016  . Bronchitis   . Cough    dry, endobronchial mass  . Dyslipidemia   . Family history of adverse reaction to anesthesia    pts son also experiences N/V  . GERD (gastroesophageal reflux disease)   . Heart murmur   . History of blood transfusion   . History of bronchitis   . History of chemotherapy   . History of nuclear stress test 02/26/2006   exercise myoview; normal pattern of perfusion; low risk scan   . Hypertension   . Hypothyroidism   . Mitral insufficiency   . Pneumonia   . PONV (postoperative nausea and vomiting)   . Radiation 12/01/13-01/08/14   50.4 gray to right central chest  . Renal mass, left 04/12/2017  . Shortness of breath    with exertion  . Squamous cell carcinoma of lung ([place])   . Syncope    "states she has passed out a few times. dr is trying to find cause.last time when geeting ready to go home after video bronch/bx  . Thyroid disease     ALLERGIES:  is allergic to lactose intolerance (gi); amiodarone; augmentin [amoxicillin-pot clavulanate]; and milk-related compounds.  MEDICATIONS:  Current Outpatient Medications  Medication Sig Dispense Refill  . albuterol (PROVENTIL HFA;VENTOLIN HFA) 108 (90 BASE) MCG/ACT inhaler Inhale 1 puff into  the lungs every 6 (six) hours as needed for wheezing or shortness of breath.     [name] amLODipine (NORVASC) 5 MG tablet Take 5 mg by mouth daily.  3  . atorvastatin (LIPITOR) 20 MG tablet Take 20 mg by mouth every morning.     . chlorthalidone (HYGROTON) 25 MG tablet TAKE 1 TABLET BY MOUTH  DAILY 90 tablet 0  . fluticasone (FLONASE) 50 MCG/ACT nasal spray Place 2 sprays into both nostrils daily.    [name] KLOR-CON 8 MEQ tablet Take 8 mEq every other day by mouth.   3  . levothyroxine (SYNTHROID, LEVOTHROID) 100 MCG tablet Take 100 mcg daily by mouth.    . lidocaine-prilocaine (EMLA) cream Apply 1 application topically as needed. (Patient taking differently: Apply 1 application topically as needed (for port access). ) 30 g 0  . metoprolol succinate (TOPROL-XL) 25 MG 24 hr tablet Take 25 mg by mouth daily.    . mirabegron ER (MYRBETRIQ) 50 MG TB24 tablet Take 50 mg by mouth daily.    . montelukast (SINGULAIR) 10 MG tablet Take 10 mg by mouth at bedtime.     . Multiple Vitamins-Iron (MULTIVITAMIN/IRON PO) Take 1 tablet by mouth daily with breakfast.     . pantoprazole (PROTONIX) 40 MG tablet TAKE 1 TABLET BY MOUTH TWO  TIMES DAILY 120 tablet 0  . chlorpheniramine-HYDROcodone (TUSSIONEX PENNKINETIC ER) 10-8 MG/5ML SUER Take 5 mLs by mouth every 12 (twelve) hours as needed for cough. (Patient not taking: Reported on 07/11/2018) 140 mL 0  . prochlorperazine (COMPAZINE) 10 MG tablet Take 1 tablet (10 mg total) by mouth every 6 (six) hours as needed for nausea or vomiting. (Patient not taking: Reported on 07/11/2018) 30 tablet 0  . traMADol (ULTRAM) 50 MG tablet Take 50 mg every 8 (eight) hours as needed by mouth. Take 1/2 tablet  3   No current facility-administered medications for this visit.     SURGICAL HISTORY:  Past Surgical History:  Procedure Laterality Date  . CARDIAC CATHETERIZATION  07/08/2008   normal coronaries  . CARPAL TUNNEL RELEASE Right 1988  . COLONOSCOPY N/A 02/11/2016   Procedure: COLONOSCOPY;  Surgeon: Carol Ada, MD;  Location: [facility];  Service: Endoscopy;  Laterality: N/A;  . DILATION AND CURETTAGE OF UTERUS    . ESOPHAGOGASTRODUODENOSCOPY N/A 02/08/2017   Procedure: ESOPHAGOGASTRODUODENOSCOPY (EGD);  Surgeon: Carol Ada, MD;  Location: [facility];  Service: Endoscopy;  Laterality: N/A;  . EYE SURGERY Bilateral   . H/O MET Test w/PFT  04/02/2012   low risk; peak VO2 77% predicted  . IR GENERIC HISTORICAL  09/12/2016   IR FLUORO  GUIDE PORT INSERTION RIGHT 09/12/2016 [name], MD [facility]  . IR GENERIC HISTORICAL  09/12/2016   IR US GUIDE VASC ACCESS RIGHT 09/12/2016 [name], MD [facility]  . JOINT REPLACEMENT  2003   thumb rt  . KNEE ARTHROSCOPY  12   rt meniscus  . MEDIASTINOSCOPY N/A 10/30/2013   Procedure: MEDIASTINOSCOPY;  Surgeon: Melrose Nakayama, MD;  Location: [facility];  Service: Thoracic;  Laterality: N/A;  . ROTATOR CUFF REPAIR  2006   ? side  . THYROIDECTOMY  1973  . TRANSTHORACIC ECHOCARDIOGRAM  09/25/2012   EF 55-60%; mild LVH & mild concentric hypertrophy; mild AV regurg; RV systolic pressure increase consistent with mild pulm HTN  . VIDEO ASSISTED THORACOSCOPY (VATS)/ LOBECTOMY Right 11/13/2013   Procedure: VIDEO ASSISTED THORACOSCOPY (VATS) with mediastinal  biopsies;  Surgeon: Melrose Nakayama, MD;  Location: [facility]  OR;  Service: Thoracic;  Laterality: Right;  RIGHT VATS,mediastinal biopsies  . VIDEO BRONCHOSCOPY Bilateral 09/25/2013   Procedure: VIDEO BRONCHOSCOPY WITHOUT FLUORO;  Surgeon: Tanda Rockers, MD;  Location: [facility];  Service: Cardiopulmonary;  Laterality: Bilateral;  . VIDEO BRONCHOSCOPY WITH ENDOBRONCHIAL ULTRASOUND N/A 10/30/2013   Procedure: VIDEO BRONCHOSCOPY WITH ENDOBRONCHIAL ULTRASOUND;  Surgeon: Melrose Nakayama, MD;  Location: [facility];  Service: Thoracic;  Laterality: N/A;    REVIEW OF SYSTEMS:  Constitutional: positive for fatigue Eyes: negative Ears, nose, mouth, throat, and face: negative Respiratory: positive for cough Cardiovascular: negative Gastrointestinal: negative Genitourinary:negative Integument/breast: negative Hematologic/lymphatic: negative Musculoskeletal:negative Neurological: negative Behavioral/Psych: negative Endocrine: negative Allergic/Immunologic: negative   PHYSICAL EXAMINATION: General appearance: alert, cooperative, fatigued and no distress Head: Normocephalic, without obvious abnormality, atraumatic Neck: no  adenopathy, no JVD, supple, symmetrical, trachea midline and thyroid not enlarged, symmetric, no tenderness/mass/nodules Lymph nodes: Cervical, supraclavicular, and axillary nodes normal. Resp: wheezes bilaterally Back: symmetric, no curvature. ROM normal. No CVA tenderness. Cardio: regular rate and rhythm, S1, S2 normal, no murmur, click, rub or gallop GI: soft, non-tender; bowel sounds normal; no masses,  no organomegaly Extremities: extremities normal, atraumatic, no cyanosis or edema Neurologic: Alert and oriented X 3, normal strength and tone. Normal symmetric reflexes. Normal coordination and gait  ECOG PERFORMANCE STATUS: 1 - Symptomatic but completely ambulatory  Blood pressure 137/79, pulse 95, temperature 98.7 F (37.1 C), temperature source Oral, resp. rate 14, height '4\' 10"'$  (1.473 m), weight 150 lb 11.2 oz (68.4 kg), SpO2 100 %.  LABORATORY DATA: Lab Results  Component Value Date   WBC 8.7 07/11/2018   HGB 10.7 (L) 07/11/2018   HCT 32.0 (L) 07/11/2018   MCV 95.2 07/11/2018   PLT 284 07/11/2018      Chemistry      Component Value Date/Time   NA 140 06/13/2018 0809   NA 140 07/12/2017 1212   K 3.5 06/13/2018 0809   K 3.5 07/12/2017 1212   CL 102 06/13/2018 0809   CO2 27 06/13/2018 0809   CO2 27 07/12/2017 1212   BUN 22 06/13/2018 0809   BUN 27.5 (H) 07/12/2017 1212   CREATININE 1.20 (H) 06/13/2018 0809   CREATININE 1.0 07/12/2017 1212      Component Value Date/Time   CALCIUM 9.5 06/13/2018 0809   CALCIUM 10.0 07/12/2017 1212   ALKPHOS 145 (H) 06/13/2018 0809   ALKPHOS 119 07/12/2017 1212   AST 14 (L) 06/13/2018 0809   AST 16 07/12/2017 1212   ALT 10 06/13/2018 0809   ALT 15 07/12/2017 1212   BILITOT 0.5 06/13/2018 0809   BILITOT 0.48 07/12/2017 1212       RADIOGRAPHIC STUDIES: Ct Chest W Contrast  Result Date: 07/11/2018 CLINICAL DATA:  Right-sided lung cancer diagnosed in 2015 and 2018. Squamous cell carcinoma. Status post chemotherapy. Dry cough  and shortness of breath. EXAM: CT CHEST, ABDOMEN, AND PELVIS WITH CONTRAST TECHNIQUE: Multidetector CT imaging of the chest, abdomen and pelvis was performed following the standard protocol during bolus administration of intravenous contrast. CONTRAST:  162m OMNIPAQUE IOHEXOL 300 MG/ML  SOLN COMPARISON:  04/16/2018 chest CT. Most recent abdominopelvic CT 01/05/2017. FINDINGS: CT CHEST FINDINGS Cardiovascular: Right Port-A-Cath tip at low SVC. Aortic atherosclerosis. Normal heart size, without pericardial effusion. Multivessel coronary artery atherosclerosis. No central pulmonary embolism, on this non-dedicated study. Mediastinum/Nodes: No supraclavicular adenopathy. Prior thyroidectomy. No mediastinal or hilar adenopathy. Prominent but not pathologically enlarged periesophageal nodes including at 7 mm on image 38/2, similar. Lungs/Pleura: No pleural fluid.  Mild right-sided pleural thickening is unchanged. Patent airways. Mild centrilobular emphysema. Calcified right lower lobe granuloma. Probable scarring in the right lower lobe including on image 102/6. Left lower lobe nodules are similar, and may be calcified. The presumed left lower lobe infection has resolved. Areas of consolidation within the superior segment right lower lobe and right middle lobe are similar in distribution. More masslike inferior right middle lobe soft tissue density measures 3.8 x 3.2 cm on image 25/2, similar to 3.9 x 3.1 cm on the prior. Musculoskeletal: Lipoma within the left supraspinatus musculature. No acute osseous abnormality. CT ABDOMEN PELVIS FINDINGS Hepatobiliary: Normal liver. Normal gallbladder, without biliary ductal dilatation. Pancreas: Normal, without mass or ductal dilatation. Spleen: Normal in size, without focal abnormality. Adrenals/Urinary Tract: Normal adrenal glands. Interpolar right renal lesion is similar at 1.3 cm. There is also lower pole right renal lesion which is likely a cyst but is too small to  characterize. Normal left kidney, without hydronephrosis. Normal urinary bladder. Stomach/Bowel: Tiny hiatal hernia. Normal distal stomach. Scattered colonic diverticula. Normal terminal ileum and appendix. Normal small bowel. Vascular/Lymphatic: Aortic and branch vessel atherosclerosis. No abdominopelvic adenopathy. Reproductive: Normal uterus and adnexa. Other: No significant free fluid. Mild pelvic floor laxity. No evidence of omental or peritoneal disease. Musculoskeletal: Incompletely imaged intramuscular lipoma within the proximal right thigh, grossly similar to 01/05/2017. On the order of 9.1 cm. Mild osteopenia. Degenerate disc disease which is advanced at L2-3. Moderate right hemidiaphragm elevation. IMPRESSION: 1. Similar appearance of right middle lobe and superior segment right lower lobe areas of likely radiation induced consolidation. More masslike area in the inferior right middle lobe is unchanged. 2. No thoracic adenopathy or evidence of disease progression. 3. No acute process or evidence of metastatic disease in the abdomen or pelvis. 4. Right renal lesion is similar in size and better characterized on prior MRI of 04/19/2017. 5. Aortic atherosclerosis (ICD10-I70.0), coronary artery atherosclerosis and emphysema (ICD10-J43.9). 6.  Tiny hiatal hernia. Electronically Signed   By: Abigail Miyamoto M.D.   On: 07/11/2018 07:59   Ct Abdomen Pelvis W Contrast  Result Date: 07/11/2018 CLINICAL DATA:  Right-sided lung cancer diagnosed in 2015 and 2018. Squamous cell carcinoma. Status post chemotherapy. Dry cough and shortness of breath. EXAM: CT CHEST, ABDOMEN, AND PELVIS WITH CONTRAST TECHNIQUE: Multidetector CT imaging of the chest, abdomen and pelvis was performed following the standard protocol during bolus administration of intravenous contrast. CONTRAST:  120m OMNIPAQUE IOHEXOL 300 MG/ML  SOLN COMPARISON:  04/16/2018 chest CT. Most recent abdominopelvic CT 01/05/2017. FINDINGS: CT CHEST FINDINGS  Cardiovascular: Right Port-A-Cath tip at low SVC. Aortic atherosclerosis. Normal heart size, without pericardial effusion. Multivessel coronary artery atherosclerosis. No central pulmonary embolism, on this non-dedicated study. Mediastinum/Nodes: No supraclavicular adenopathy. Prior thyroidectomy. No mediastinal or hilar adenopathy. Prominent but not pathologically enlarged periesophageal nodes including at 7 mm on image 38/2, similar. Lungs/Pleura: No pleural fluid. Mild right-sided pleural thickening is unchanged. Patent airways. Mild centrilobular emphysema. Calcified right lower lobe granuloma. Probable scarring in the right lower lobe including on image 102/6. Left lower lobe nodules are similar, and may be calcified. The presumed left lower lobe infection has resolved. Areas of consolidation within the superior segment right lower lobe and right middle lobe are similar in distribution. More masslike inferior right middle lobe soft tissue density measures 3.8 x 3.2 cm on image 25/2, similar to 3.9 x 3.1 cm on the prior. Musculoskeletal: Lipoma within the left supraspinatus musculature. No acute osseous abnormality. CT ABDOMEN PELVIS FINDINGS Hepatobiliary:  Normal liver. Normal gallbladder, without biliary ductal dilatation. Pancreas: Normal, without mass or ductal dilatation. Spleen: Normal in size, without focal abnormality. Adrenals/Urinary Tract: Normal adrenal glands. Interpolar right renal lesion is similar at 1.3 cm. There is also lower pole right renal lesion which is likely a cyst but is too small to characterize. Normal left kidney, without hydronephrosis. Normal urinary bladder. Stomach/Bowel: Tiny hiatal hernia. Normal distal stomach. Scattered colonic diverticula. Normal terminal ileum and appendix. Normal small bowel. Vascular/Lymphatic: Aortic and branch vessel atherosclerosis. No abdominopelvic adenopathy. Reproductive: Normal uterus and adnexa. Other: No significant free fluid. Mild pelvic floor  laxity. No evidence of omental or peritoneal disease. Musculoskeletal: Incompletely imaged intramuscular lipoma within the proximal right thigh, grossly similar to 01/05/2017. On the order of 9.1 cm. Mild osteopenia. Degenerate disc disease which is advanced at L2-3. Moderate right hemidiaphragm elevation. IMPRESSION: 1. Similar appearance of right middle lobe and superior segment right lower lobe areas of likely radiation induced consolidation. More masslike area in the inferior right middle lobe is unchanged. 2. No thoracic adenopathy or evidence of disease progression. 3. No acute process or evidence of metastatic disease in the abdomen or pelvis. 4. Right renal lesion is similar in size and better characterized on prior MRI of 04/19/2017. 5. Aortic atherosclerosis (ICD10-I70.0), coronary artery atherosclerosis and emphysema (ICD10-J43.9). 6.  Tiny hiatal hernia. Electronically Signed   By: Abigail Miyamoto M.D.   On: 07/11/2018 07:59    ASSESSMENT AND PLAN: This is a very pleasant 77 years old white female with recurrent non-small cell lung cancer, squamous cell carcinoma. The patient completed 6 cycles of systemic chemotherapy with carboplatin and paclitaxel and tolerated her treatment well except for fatigue as well as peripheral neuropathy. The patient has been in observation.  She continues to have persistent dry cough and shortness of breath. She had evidence for disease progression on restaging scan. The patient was started on treatment with second line immunotherapy with Nivolumab 480 mg IV every 4 weeks status post 6 cycles.  The patient continues to tolerate this treatment well with no concerning adverse effects. She had repeat CT scan of the chest performed recently.  I personally and independently reviewed the scans and discussed the results with the patient today. Her scan showed no concerning findings for disease progression. I recommended for her to continue her current treatment with  nivolumab and she will proceed with cycle #7 today. For the dry cough she will try over-the-counter Delsym as the patient cannot afford Dostinex. I will see her back for follow-up visit in 4 weeks for evaluation before starting cycle #8 of her treatment. She was advised to call immediately if she has any other concerning symptoms in the interval. The patient voices understanding of current disease status and treatment options and is in agreement with the current care plan. All questions were answered. The patient knows to call the clinic with any problems, questions or concerns. We can certainly see the patient much sooner if necessary.   Disclaimer: This note was dictated with voice recognition software. Similar sounding words can inadvertently be transcribed and may not be corrected upon review.

## 2018-07-11 NOTE — Patient Instructions (Signed)
[facility] Discharge Instructions for Patients Receiving Chemotherapy  Today you received the following chemotherapy agents nivolumab (Opdivo)  To help prevent nausea and vomiting after your treatment, we encourage you to take your nausea medication as directed by your doctor.   If you develop nausea and vomiting that is not controlled by your nausea medication, call the clinic.   BELOW ARE SYMPTOMS THAT SHOULD BE REPORTED IMMEDIATELY:  *FEVER GREATER THAN 100.5 F  *CHILLS WITH OR WITHOUT FEVER  NAUSEA AND VOMITING THAT IS NOT CONTROLLED WITH YOUR NAUSEA MEDICATION  *UNUSUAL SHORTNESS OF BREATH  *UNUSUAL BRUISING OR BLEEDING  TENDERNESS IN MOUTH AND THROAT WITH OR WITHOUT PRESENCE OF ULCERS  *URINARY PROBLEMS  *BOWEL PROBLEMS  UNUSUAL RASH Items with * indicate a potential emergency and should be followed up as soon as possible.  Feel free to call the clinic should you have any questions or concerns. The clinic phone number is [phone].  Please show the [facility] at check-in to the Emergency Department and triage nurse.

## 2018-07-15 ENCOUNTER — Other Ambulatory Visit: Payer: Self-pay | Admitting: Internal Medicine

## 2018-07-24 ENCOUNTER — Other Ambulatory Visit: Payer: Self-pay

## 2018-07-24 NOTE — Patient Outreach (Signed)
[facility]) Care Management  07/24/2018  Kimberly [name] 1940-09-20 [id number]    2nd attempt to outreach the patient for assessment.  No answer. HIPAA compliant voicemail left with contact information.  Plan: RN Health Coach will send letter. Plan: RN Health Coach will make another outreach attempt to the patient within thirty business days.  [name] RN, BSN, [place] Direct Dial:  [phone]  Fax: [phone]

## 2018-07-24 NOTE — Patient Outreach (Signed)
[facility]) Care Management  07/24/2018   Kimberly [name] 1941/02/06 [id number]  Subjective: Successful telephone outreach to the patient for assessment.  HIPAA verified.  The patient states that she has been doing well. The patient denies any pain or falls. The patient's blood pressure has been in the 130's/70's.  She states that she does have shortness of breath and cough.  It is related to her lung cancer and that is usual for her.  I discussed the A-Fib action plan with the patient and she verbalized understanding. She is watching her diet, taking her medications as prescribed.  The patient states that she received her flu shot in the month of October.  Current Medications:  Current Outpatient Medications  Medication Sig Dispense Refill  . albuterol (PROVENTIL HFA;VENTOLIN HFA) 108 (90 BASE) MCG/ACT inhaler Inhale 1 puff into the lungs every 6 (six) hours as needed for wheezing or shortness of breath.     [name] amLODipine (NORVASC) 5 MG tablet Take 5 mg by mouth daily.  3  . atorvastatin (LIPITOR) 20 MG tablet Take 20 mg by mouth every morning.     . chlorthalidone (HYGROTON) 25 MG tablet TAKE 1 TABLET BY MOUTH  DAILY 90 tablet 0  . chlorthalidone (HYGROTON) 25 MG tablet TAKE 1 TABLET BY MOUTH  DAILY 90 tablet 1  . fluticasone (FLONASE) 50 MCG/ACT nasal spray Place 2 sprays into both nostrils daily.    [name] KLOR-CON 8 MEQ tablet Take 8 mEq every other day by mouth.  3  . levothyroxine (SYNTHROID, LEVOTHROID) 100 MCG tablet Take 100 mcg daily by mouth.    . metoprolol succinate (TOPROL-XL) 25 MG 24 hr tablet Take 25 mg by mouth daily.    . mirabegron ER (MYRBETRIQ) 50 MG TB24 tablet Take 50 mg by mouth daily.    . montelukast (SINGULAIR) 10 MG tablet Take 10 mg by mouth at bedtime.     . Multiple Vitamins-Iron (MULTIVITAMIN/IRON PO) Take 1 tablet by mouth daily with breakfast.     . pantoprazole (PROTONIX) 40 MG tablet TAKE 1 TABLET BY MOUTH TWO  TIMES DAILY 120 tablet 0  .  traMADol (ULTRAM) 50 MG tablet Take 50 mg every 8 (eight) hours as needed by mouth. Take 1/2 tablet  3  . chlorpheniramine-HYDROcodone (TUSSIONEX PENNKINETIC ER) 10-8 MG/5ML SUER Take 5 mLs by mouth every 12 (twelve) hours as needed for cough. (Patient not taking: Reported on 07/11/2018) 140 mL 0  . lidocaine-prilocaine (EMLA) cream Apply 1 application topically as needed. (Patient taking differently: Apply 1 application topically as needed (for port access). ) 30 g 0  . prochlorperazine (COMPAZINE) 10 MG tablet Take 1 tablet (10 mg total) by mouth every 6 (six) hours as needed for nausea or vomiting. (Patient not taking: Reported on 07/11/2018) 30 tablet 0   No current facility-administered medications for this visit.     Functional Status:  In your present state of health, do you have any difficulty performing the following activities: 03/28/2018  Hearing? N  Vision? N  Difficulty concentrating or making decisions? N  Walking or climbing stairs? Y  Comment repiratory issues  Dressing or bathing? N  Doing errands, shopping? N  Preparing Food and eating ? N  Using the Toilet? N  In the past six months, have you accidently leaked urine? N  Do you have problems with loss of bowel control? N  Managing your Medications? N  Managing your Finances? N  Housekeeping or managing your Housekeeping? N  Some recent data might be hidden    Fall/Depression Screening: Fall Risk  07/24/2018 05/03/2018 03/28/2018  Falls in the past year? 0 No Yes  Number falls in past yr: - - 1  Comment - - -  Injury with Fall? - - No  Risk Factor Category  - - -  Risk for fall due to : - - -  Risk for fall due to: Comment - - -   PHQ 2/9 Scores 03/28/2018  PHQ - 2 Score 1    Assessment: Patient will continue to benefit from health coach outreach for disease management and support.  THN CM Care Plan Problem One     Most Recent Value  THN Long Term Goal   In 60 days the patient will verbalize that her blood  pressure remians in the 140's  THN Long Term Goal Start Date  07/24/18  Interventions for Problem One Long Term Goal  Discussed diet and medication adherence  THN CM Short Term Goal #1   In 30 days the patient will monitor her blood pressure and record values daily  THN CM Short Term Goal #1 Start Date  07/24/18  Interventions for Short Term Goal #1  Goal met  THN CM Short Term Goal #2   In 30 days the patient will verbalize monitoring salt in her diet  THN CM Short Term Goal #2 Start Date  07/24/18  Interventions for Short Term Goal #2  goal met      Plan:  Plan: [place] will contact patient in the month of January and patient agrees to next outreach.    [name] RN, BSN, [place] Direct Dial:  [phone]  Fax: [phone]

## 2018-08-05 ENCOUNTER — Ambulatory Visit: Payer: Medicare Other | Admitting: Primary Care

## 2018-08-07 ENCOUNTER — Ambulatory Visit (INDEPENDENT_AMBULATORY_CARE_PROVIDER_SITE_OTHER): Payer: Medicare Other | Admitting: Primary Care

## 2018-08-07 VITALS — BP 112/60 | HR 70 | Temp 98.2°F | Ht <= 58 in | Wt 149.6 lb

## 2018-08-07 DIAGNOSIS — R05 Cough: Secondary | ICD-10-CM | POA: Diagnosis not present

## 2018-08-07 DIAGNOSIS — R059 Cough, unspecified: Secondary | ICD-10-CM

## 2018-08-07 MED ORDER — MOMETASONE FUROATE 100 MCG/ACT IN AERO: 2.0000 | INHALATION_SPRAY | Freq: Two times a day (BID) | RESPIRATORY_TRACT | 1 refills | Status: DC

## 2018-08-07 NOTE — Patient Instructions (Addendum)
Trial Asmanex - take 2 puffs twice a day (sample given)  Continue Protonix twice a day   Continue Singulair and Flonase 2 sprays into both nostrils daily   Follow up in 2 weeks with Eustaquio Maize NP

## 2018-08-07 NOTE — Progress Notes (Signed)
@Patient  ID: Kimberly [name], female    DOB: [birth year], 77 y.o.   MRN: [id number]  Chief Complaint  Patient presents with  . Follow-up    tickle gone, restricted talking, slight  dry cough especially with change in environment and smells    Referring provider: Deland Pretty, MD   Synopsis: Referred in May 2019 for cough in the setting of squamous cell carcinoma of the lung.  She had a VATS biopsy and mediastinal exploration in March 2015.  Tumor was found to be unresectable at that time.  Underwent concurrent chemoradiation starting in 2015.  Had consolidation chemotherapy with carboplatin and paclitaxel in June 2015.  Underwent systemic chemotherapy with carboplatin and paclitaxel in late 2017 through early 2018.  As of her first visit with the pulmonary clinic in May 2019 she was currently on immunotherapy with Nivolumab   HPI: Patient presents today for 4 week follow-up visit. States that she was some better with voice rest. Continues to have cough, mostly dry. She has had this cough for a long time, states worse since dx of lung cancer. Environmental changes affect her cough, including exposure to dusk and mold. Talking also seems to cause her to cough. Reports allergies to mold, dust and cats. She has seen an allergist in the past. Takes Singulair and Flonase. Continues to have baseline  shortness of breath. Uses nebulizer once a day which does help. PFTs in 2015 were normal. She was unable to do FENO today.  Spirometry today showed no clear restriction or obstruction.    Spirometry 08/12/2018 Trial 1- FVC 1.5 (70%), FEV1 1.1 (68%), ratio 73 Trial 1- FVC 2.6 (123%), FEV1 2.1 (132%), ratio 80 Trial 3- FVC 1.8 (83%), FEV1 1.5 (94%), ratio 84   Allergies  Allergen Reactions  . Lactose Intolerance (Gi) Other (See Comments)  . Amiodarone Other (See Comments)    Corneal deposits  . Augmentin [Amoxicillin-Pot Clavulanate] Diarrhea    *ended up with cdiff* Has patient had a PCN  reaction causing immediate rash, facial/tongue/throat swelling, SOB or lightheadedness with hypotension: No Has patient had a PCN reaction causing severe rash involving mucus membranes or skin necrosis: No Has patient had a PCN reaction that required hospitalization: Yes Has patient had a PCN reaction occurring within the last 10 years: Yes If all of the above answers are "NO", then may proceed with Cephalosporin use.   . Milk-Related Compounds Other (See Comments)    GI upset, projectile vomiting - can't take any dairy products    Immunization History  Administered Date(s) Administered  . DTaP 06/04/2013  . H1N1 08/17/2008  . Influenza Split 06/04/2013, 10/29/2014  . Influenza, High Dose Seasonal PF 06/10/2018  . Influenza-Unspecified 06/04/2014, 07/06/2015, 06/21/2016, 06/26/2017  . Pneumococcal Polysaccharide-23 11/16/2013  . Zoster Recombinat (Shingrix) 06/11/2018    Past Medical History:  Diagnosis Date  . Allergic asthma without acute exacerbation or status asthmaticus   . Aortic insufficiency   . Arthritis   . Atrial fibrillation ([place])   . Back pain 08/14/2016  . Bronchitis   . Cough    dry, endobronchial mass  . Dyslipidemia   . Family history of adverse reaction to anesthesia    pts son also experiences N/V  . GERD (gastroesophageal reflux disease)   . Heart murmur   . History of blood transfusion   . History of bronchitis   . History of chemotherapy   . History of nuclear stress test 02/26/2006   exercise myoview; normal pattern of perfusion; low  risk scan   . Hypertension   . Hypothyroidism   . Mitral insufficiency   . Pneumonia   . PONV (postoperative nausea and vomiting)   . Radiation 12/01/13-01/08/14   50.4 gray to right central chest  . Renal mass, left 04/12/2017  . Shortness of breath    with exertion  . Squamous cell carcinoma of lung ([place])   . Syncope    "states she has passed out a few times. dr is trying to find cause.last time when geeting ready  to go home after video bronch/bx  . Thyroid disease     Tobacco History: Social History   Tobacco Use  Smoking Status Former Smoker  . Packs/day: 0.50  . Years: 6.00  . Pack years: 3.00  . Types: Cigarettes  . Last attempt to quit: 09/05/1967  . Years since quitting: 50.9  Smokeless Tobacco Never Used  Tobacco Comment   occ wine   Counseling given: Not Answered Comment: occ wine   Outpatient Medications Prior to Visit  Medication Sig Dispense Refill  . albuterol (PROVENTIL HFA;VENTOLIN HFA) 108 (90 BASE) MCG/ACT inhaler Inhale 1 puff into the lungs every 6 (six) hours as needed for wheezing or shortness of breath.     [name] amLODipine (NORVASC) 5 MG tablet Take 5 mg by mouth daily.  3  . atorvastatin (LIPITOR) 20 MG tablet Take 20 mg by mouth every morning.     . chlorthalidone (HYGROTON) 25 MG tablet TAKE 1 TABLET BY MOUTH  DAILY 90 tablet 1  . fluticasone (FLONASE) 50 MCG/ACT nasal spray Place 2 sprays into both nostrils daily.    [name] KLOR-CON 8 MEQ tablet Take 8 mEq every other day by mouth.  3  . levothyroxine (SYNTHROID, LEVOTHROID) 100 MCG tablet Take 100 mcg daily by mouth.    . lidocaine-prilocaine (EMLA) cream Apply 1 application topically as needed. (Patient taking differently: Apply 1 application topically as needed (for port access). ) 30 g 0  . metoprolol succinate (TOPROL-XL) 25 MG 24 hr tablet Take 25 mg by mouth daily.    . montelukast (SINGULAIR) 10 MG tablet Take 10 mg by mouth at bedtime.     . Multiple Vitamins-Iron (MULTIVITAMIN/IRON PO) Take 1 tablet by mouth daily with breakfast.     . pantoprazole (PROTONIX) 40 MG tablet TAKE 1 TABLET BY MOUTH TWO  TIMES DAILY 120 tablet 0  . prochlorperazine (COMPAZINE) 10 MG tablet Take 1 tablet (10 mg total) by mouth every 6 (six) hours as needed for nausea or vomiting. 30 tablet 0  . traMADol (ULTRAM) 50 MG tablet Take 50 mg every 8 (eight) hours as needed by mouth. Take 1/2 tablet  3  . chlorpheniramine-HYDROcodone  (TUSSIONEX PENNKINETIC ER) 10-8 MG/5ML SUER Take 5 mLs by mouth every 12 (twelve) hours as needed for cough. 140 mL 0  . chlorthalidone (HYGROTON) 25 MG tablet TAKE 1 TABLET BY MOUTH  DAILY (Patient not taking: Reported on 08/08/2018) 90 tablet 0  . mirabegron ER (MYRBETRIQ) 50 MG TB24 tablet Take 50 mg by mouth daily.     No facility-administered medications prior to visit.     Review of Systems  Review of Systems  Constitutional: Negative.   HENT: Negative.   Respiratory: Positive for cough, shortness of breath and wheezing.   Cardiovascular: Negative.     Physical Exam  BP 112/60 (BP Location: Left Arm, Cuff Size: Normal)   Pulse 70   Temp 98.2 F (36.8 C)   Ht 4\' 10"  (1.473 m)  Wt 149 lb 9.6 oz (67.9 kg)   SpO2 98%   BMI 31.27 kg/m  Physical Exam  Constitutional: She is oriented to person, place, and time. She appears well-developed and well-nourished. No distress.  HENT:  Head: Normocephalic and atraumatic.  Eyes: Pupils are equal, round, and reactive to light. EOM are normal.  Neck: Normal range of motion. Neck supple.  Cardiovascular: Normal rate and regular rhythm.  Pulmonary/Chest: Effort normal and breath sounds normal. She has no wheezes.  Musculoskeletal: Normal range of motion.  Neurological: She is alert and oriented to person, place, and time.  Skin: Skin is warm and dry.  Psychiatric: She has a normal mood and affect. Her behavior is normal. Judgment and thought content normal.     Lab Results:  CBC    Component Value Date/Time   WBC 8.1 08/08/2018 1020   WBC 7.4 10/15/2017 1116   RBC 3.37 (L) 08/08/2018 1020   HGB 10.5 (L) 08/08/2018 1020   HGB 11.3 (L) 07/12/2017 1212   HCT 31.7 (L) 08/08/2018 1020   HCT 33.1 (L) 07/12/2017 1212   PLT 293 08/08/2018 1020   PLT 251 07/12/2017 1212   MCV 94.1 08/08/2018 1020   MCV 101.9 (H) 07/12/2017 1212   MCH 31.2 08/08/2018 1020   MCHC 33.1 08/08/2018 1020   RDW 13.0 08/08/2018 1020   RDW 12.3 07/12/2017  1212   LYMPHSABS 1.4 08/08/2018 1020   LYMPHSABS 1.4 07/12/2017 1212   MONOABS 0.5 08/08/2018 1020   MONOABS 0.6 07/12/2017 1212   EOSABS 0.4 08/08/2018 1020   EOSABS 0.2 07/12/2017 1212   BASOSABS 0.1 08/08/2018 1020   BASOSABS 0.1 07/12/2017 1212    BMET    Component Value Date/Time   NA 141 08/08/2018 1020   NA 140 07/12/2017 1212   K 3.5 08/08/2018 1020   K 3.5 07/12/2017 1212   CL 105 08/08/2018 1020   CO2 27 08/08/2018 1020   CO2 27 07/12/2017 1212   GLUCOSE 89 08/08/2018 1020   GLUCOSE 102 07/12/2017 1212   BUN 22 08/08/2018 1020   BUN 27.5 (H) 07/12/2017 1212   CREATININE 0.99 08/08/2018 1020   CREATININE 1.0 07/12/2017 1212   CALCIUM 9.1 08/08/2018 1020   CALCIUM 10.0 07/12/2017 1212   GFRNONAA 56 (L) 08/08/2018 1020   GFRAA >60 08/08/2018 1020    BNP No results found for: BNP  ProBNP    Component Value Date/Time   PROBNP 2,388.0 (H) 02/07/2014 2123    Imaging: No results found.   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 Lake Bells, he recommends short prednisone course (20mg  x 1 week, 10mg  x 1 week)  Spirometry showed no clear restriction or obstruction  Unable to do FENO  Trial Asmanex - take 2 puffs twice a day (sample given) Continue Protonix twice a day  Continue Singulair and Flonase 2 sprays into both nostrils daily  Follow up in 2 weeks with Eustaquio Maize NP     Martyn Ehrich, NP 08/12/2018

## 2018-08-08 ENCOUNTER — Inpatient Hospital Stay: Payer: Medicare Other | Attending: Internal Medicine

## 2018-08-08 ENCOUNTER — Inpatient Hospital Stay: Payer: Medicare Other

## 2018-08-08 ENCOUNTER — Inpatient Hospital Stay (HOSPITAL_BASED_OUTPATIENT_CLINIC_OR_DEPARTMENT_OTHER): Payer: Medicare Other | Admitting: Internal Medicine

## 2018-08-08 ENCOUNTER — Other Ambulatory Visit: Payer: Self-pay

## 2018-08-08 ENCOUNTER — Encounter: Payer: Self-pay | Admitting: Internal Medicine

## 2018-08-08 VITALS — BP 142/69 | HR 70 | Temp 98.4°F | Resp 17 | Ht <= 58 in | Wt 150.2 lb

## 2018-08-08 DIAGNOSIS — C342 Malignant neoplasm of middle lobe, bronchus or lung: Secondary | ICD-10-CM | POA: Diagnosis not present

## 2018-08-08 DIAGNOSIS — Z5112 Encounter for antineoplastic immunotherapy: Secondary | ICD-10-CM | POA: Diagnosis not present

## 2018-08-08 DIAGNOSIS — R0602 Shortness of breath: Secondary | ICD-10-CM | POA: Diagnosis not present

## 2018-08-08 DIAGNOSIS — R5383 Other fatigue: Secondary | ICD-10-CM

## 2018-08-08 DIAGNOSIS — I10 Essential (primary) hypertension: Secondary | ICD-10-CM

## 2018-08-08 DIAGNOSIS — R05 Cough: Secondary | ICD-10-CM | POA: Insufficient documentation

## 2018-08-08 DIAGNOSIS — E860 Dehydration: Secondary | ICD-10-CM

## 2018-08-08 DIAGNOSIS — C3411 Malignant neoplasm of upper lobe, right bronchus or lung: Secondary | ICD-10-CM

## 2018-08-08 DIAGNOSIS — Z79899 Other long term (current) drug therapy: Secondary | ICD-10-CM | POA: Diagnosis not present

## 2018-08-08 DIAGNOSIS — C3490 Malignant neoplasm of unspecified part of unspecified bronchus or lung: Secondary | ICD-10-CM

## 2018-08-08 LAB — CMP (CANCER CENTER ONLY)
ALK PHOS: 155 U/L — AB (ref 38–126)
ALT: 9 U/L (ref 0–44)
AST: 15 U/L (ref 15–41)
Albumin: 3.2 g/dL — ABNORMAL LOW (ref 3.5–5.0)
Anion gap: 9 (ref 5–15)
BUN: 22 mg/dL (ref 8–23)
CHLORIDE: 105 mmol/L (ref 98–111)
CO2: 27 mmol/L (ref 22–32)
CREATININE: 0.99 mg/dL (ref 0.44–1.00)
Calcium: 9.1 mg/dL (ref 8.9–10.3)
GFR, EST NON AFRICAN AMERICAN: 56 mL/min — AB (ref >60)
GLUCOSE: 89 mg/dL (ref 70–99)
Potassium: 3.5 mmol/L (ref 3.5–5.1)
SODIUM: 141 mmol/L (ref 135–145)
Total Bilirubin: 0.3 mg/dL (ref 0.3–1.2)
Total Protein: 6.7 g/dL (ref 6.5–8.1)

## 2018-08-08 LAB — CBC WITH DIFFERENTIAL (CANCER CENTER ONLY)
ABS IMMATURE GRANULOCYTES: 0.03 10*3/uL (ref 0.00–0.07)
BASOS PCT: 1 %
Basophils Absolute: 0.1 10*3/uL (ref 0.0–0.1)
Eosinophils Absolute: 0.4 10*3/uL (ref 0.0–0.5)
Eosinophils Relative: 5 %
HCT: 31.7 % — ABNORMAL LOW (ref 36.0–46.0)
HEMOGLOBIN: 10.5 g/dL — AB (ref 12.0–15.0)
Immature Granulocytes: 0 %
LYMPHS PCT: 17 %
Lymphs Abs: 1.4 10*3/uL (ref 0.7–4.0)
MCH: 31.2 pg (ref 26.0–34.0)
MCHC: 33.1 g/dL (ref 30.0–36.0)
MCV: 94.1 fL (ref 80.0–100.0)
MONO ABS: 0.5 10*3/uL (ref 0.1–1.0)
MONOS PCT: 7 %
NEUTROS ABS: 5.8 10*3/uL (ref 1.7–7.7)
NEUTROS PCT: 70 %
PLATELETS: 293 10*3/uL (ref 150–400)
RBC: 3.37 MIL/uL — ABNORMAL LOW (ref 3.87–5.11)
RDW: 13 % (ref 11.5–15.5)
WBC Count: 8.1 10*3/uL (ref 4.0–10.5)
nRBC: 0 % (ref 0.0–0.2)

## 2018-08-08 LAB — TSH: TSH: 1.382 u[IU]/mL (ref 0.308–3.960)

## 2018-08-08 MED ORDER — SODIUM CHLORIDE 0.9% FLUSH
10.0000 mL | INTRAVENOUS | Status: DC | PRN
  Administered 2018-08-08: 10 mL
  Filled 2018-08-08: qty 10

## 2018-08-08 MED ORDER — SODIUM CHLORIDE 0.9 % IV SOLN
480.0000 mg | Freq: Once | INTRAVENOUS | Status: AC
  Administered 2018-08-08: 480 mg via INTRAVENOUS
  Filled 2018-08-08: qty 48

## 2018-08-08 MED ORDER — SODIUM CHLORIDE 0.9 % IV SOLN
Freq: Once | INTRAVENOUS | Status: AC
  Administered 2018-08-08: 13:00:00 via INTRAVENOUS
  Filled 2018-08-08: qty 250

## 2018-08-08 MED ORDER — HEPARIN SOD (PORK) LOCK FLUSH 100 UNIT/ML IV SOLN
500.0000 [IU] | Freq: Once | INTRAVENOUS | Status: AC | PRN
  Administered 2018-08-08: 500 [IU]
  Filled 2018-08-08: qty 5

## 2018-08-08 NOTE — Progress Notes (Signed)
[place] [phone]   Fax:([phone]) [place], MD [address] [place] [address]  DIAGNOSIS: Recurrent non-small cell lung cancer, squamous cell carcinoma initially diagnosed as unresectable a stage IIIa in February 2015.  PRIOR THERAPY: 1) Status post exploratory right VATS with mediastinal biopsy under the care of Dr. Roxan Hockey on 11/13/2013. The tumor was found to be unresectable at that time.[name] 2) Concurrent chemoradiation with weekly carboplatin for AUC of 2 and paclitaxel 45 mg/M2, status post 6 cycles with partial response. First cycle was given on 12/01/2013. 3) Consolidation chemotherapy with carboplatin for AUC of 5 and paclitaxel 175 mg/M2 every 3 weeks with Neulasta support. First dose 02/23/2014. Status post 3 cycles with partial response. 4) Systemic chemotherapy with carboplatin for AUC of 5 and paclitaxel 175 MG/M2 every 3 weeks with Neulasta support. Status post 6 cycles. First dose was given on 08/14/2016.  Last dose was giving 12/11/2016 with partial response.  CURRENT THERAPY: Second line treatment with immunotherapy with Nivolumab 480 mg IV every 4 weeks, first dose Jan 25, 2018.  Status post 7 cycles.  INTERVAL HISTORY: Kimberly [name] 77 y.o. female returns to the clinic today for follow-up visit.  The patient is feeling fine today with no concerning complaints.  She continues to tolerate her treatment with nivolumab fairly well except for fatigue for few days.  She denied having any nausea, vomiting, diarrhea or constipation.  She denied having any headache or visual changes.  She has no chest pain but has shortness of breath with exertion with no cough or hemoptysis.  She is here today for evaluation before starting cycle #8 of her treatment.  MEDICAL HISTORY: Past Medical History:  Diagnosis Date  . Allergic asthma without acute exacerbation or status asthmaticus   . Aortic  insufficiency   . Arthritis   . Atrial fibrillation ([place])   . Back pain 08/14/2016  . Bronchitis   . Cough    dry, endobronchial mass  . Dyslipidemia   . Family history of adverse reaction to anesthesia    pts son also experiences N/V  . GERD (gastroesophageal reflux disease)   . Heart murmur   . History of blood transfusion   . History of bronchitis   . History of chemotherapy   . History of nuclear stress test 02/26/2006   exercise myoview; normal pattern of perfusion; low risk scan   . Hypertension   . Hypothyroidism   . Mitral insufficiency   . Pneumonia   . PONV (postoperative nausea and vomiting)   . Radiation 12/01/13-01/08/14   50.4 gray to right central chest  . Renal mass, left 04/12/2017  . Shortness of breath    with exertion  . Squamous cell carcinoma of lung ([place])   . Syncope    "states she has passed out a few times. dr is trying to find cause.last time when geeting ready to go home after video bronch/bx  . Thyroid disease     ALLERGIES:  is allergic to lactose intolerance (gi); amiodarone; augmentin [amoxicillin-pot clavulanate]; and milk-related compounds.  MEDICATIONS:  Current Outpatient Medications  Medication Sig Dispense Refill  . albuterol (PROVENTIL HFA;VENTOLIN HFA) 108 (90 BASE) MCG/ACT inhaler Inhale 1 puff into the lungs every 6 (six) hours as needed for wheezing or shortness of breath.     [name] amLODipine (NORVASC) 5 MG tablet Take 5 mg by mouth daily.  3  . atorvastatin (LIPITOR) 20  MG tablet Take 20 mg by mouth every morning.     . chlorthalidone (HYGROTON) 25 MG tablet TAKE 1 TABLET BY MOUTH  DAILY 90 tablet 0  . chlorthalidone (HYGROTON) 25 MG tablet TAKE 1 TABLET BY MOUTH  DAILY 90 tablet 1  . fluticasone (FLONASE) 50 MCG/ACT nasal spray Place 2 sprays into both nostrils daily.    [name] KLOR-CON 8 MEQ tablet Take 8 mEq every other day by mouth.  3  . levothyroxine (SYNTHROID, LEVOTHROID) 100 MCG tablet Take 100 mcg daily by mouth.    .  lidocaine-prilocaine (EMLA) cream Apply 1 application topically as needed. (Patient taking differently: Apply 1 application topically as needed (for port access). ) 30 g 0  . metoprolol succinate (TOPROL-XL) 25 MG 24 hr tablet Take 25 mg by mouth daily.    . mirabegron ER (MYRBETRIQ) 50 MG TB24 tablet Take 50 mg by mouth daily.    . Mometasone Furoate (ASMANEX HFA) 100 MCG/ACT AERO Inhale 2 puffs into the lungs 2 (two) times daily. 13 g 1  . montelukast (SINGULAIR) 10 MG tablet Take 10 mg by mouth at bedtime.     . Multiple Vitamins-Iron (MULTIVITAMIN/IRON PO) Take 1 tablet by mouth daily with breakfast.     . pantoprazole (PROTONIX) 40 MG tablet TAKE 1 TABLET BY MOUTH TWO  TIMES DAILY 120 tablet 0  . prochlorperazine (COMPAZINE) 10 MG tablet Take 1 tablet (10 mg total) by mouth every 6 (six) hours as needed for nausea or vomiting. 30 tablet 0  . traMADol (ULTRAM) 50 MG tablet Take 50 mg every 8 (eight) hours as needed by mouth. Take 1/2 tablet  3   No current facility-administered medications for this visit.     SURGICAL HISTORY:  Past Surgical History:  Procedure Laterality Date  . CARDIAC CATHETERIZATION  07/08/2008   normal coronaries  . CARPAL TUNNEL RELEASE Right 1988  . COLONOSCOPY N/A 02/11/2016   Procedure: COLONOSCOPY;  Surgeon: Carol Ada, MD;  Location: [facility];  Service: Endoscopy;  Laterality: N/A;  . DILATION AND CURETTAGE OF UTERUS    . ESOPHAGOGASTRODUODENOSCOPY N/A 02/08/2017   Procedure: ESOPHAGOGASTRODUODENOSCOPY (EGD);  Surgeon: Carol Ada, MD;  Location: [facility];  Service: Endoscopy;  Laterality: N/A;  . EYE SURGERY Bilateral   . H/O MET Test w/PFT  04/02/2012   low risk; peak VO2 77% predicted  . IR GENERIC HISTORICAL  09/12/2016   IR FLUORO GUIDE PORT INSERTION RIGHT 09/12/2016 [name], MD [facility]  . IR GENERIC HISTORICAL  09/12/2016   IR US GUIDE VASC ACCESS RIGHT 09/12/2016 [name], MD [facility]  . JOINT REPLACEMENT  2003   thumb  rt  . KNEE ARTHROSCOPY  12   rt meniscus  . MEDIASTINOSCOPY N/A 10/30/2013   Procedure: MEDIASTINOSCOPY;  Surgeon: Melrose Nakayama, MD;  Location: [facility];  Service: Thoracic;  Laterality: N/A;  . ROTATOR CUFF REPAIR  2006   ? side  . THYROIDECTOMY  1973  . TRANSTHORACIC ECHOCARDIOGRAM  09/25/2012   EF 55-60%; mild LVH & mild concentric hypertrophy; mild AV regurg; RV systolic pressure increase consistent with mild pulm HTN  . VIDEO ASSISTED THORACOSCOPY (VATS)/ LOBECTOMY Right 11/13/2013   Procedure: VIDEO ASSISTED THORACOSCOPY (VATS) with mediastinal  biopsies;  Surgeon: Melrose Nakayama, MD;  Location: [facility];  Service: Thoracic;  Laterality: Right;  RIGHT VATS,mediastinal biopsies  . VIDEO BRONCHOSCOPY Bilateral 09/25/2013   Procedure: VIDEO BRONCHOSCOPY WITHOUT FLUORO;  Surgeon: Tanda Rockers, MD;  Location: [facility];  Service: Cardiopulmonary;  Laterality: Bilateral;  . VIDEO BRONCHOSCOPY WITH ENDOBRONCHIAL ULTRASOUND N/A 10/30/2013   Procedure: VIDEO BRONCHOSCOPY WITH ENDOBRONCHIAL ULTRASOUND;  Surgeon: Melrose Nakayama, MD;  Location: [facility];  Service: Thoracic;  Laterality: N/A;    REVIEW OF SYSTEMS:  A comprehensive review of systems was negative except for: Constitutional: positive for fatigue   PHYSICAL EXAMINATION: General appearance: alert, cooperative, fatigued and no distress Head: Normocephalic, without obvious abnormality, atraumatic Neck: no adenopathy, no JVD, supple, symmetrical, trachea midline and thyroid not enlarged, symmetric, no tenderness/mass/nodules Lymph nodes: Cervical, supraclavicular, and axillary nodes normal. Resp: clear to auscultation bilaterally Back: symmetric, no curvature. ROM normal. No CVA tenderness. Cardio: regular rate and rhythm, S1, S2 normal, no murmur, click, rub or gallop GI: soft, non-tender; bowel sounds normal; no masses,  no organomegaly Extremities: extremities normal, atraumatic, no cyanosis or edema  ECOG PERFORMANCE  STATUS: 1 - Symptomatic but completely ambulatory  Blood pressure (!) 142/69, pulse 70, temperature 98.4 F (36.9 C), temperature source Oral, resp. rate 17, height '4\' 10"'  (1.473 m), weight 150 lb 3.2 oz (68.1 kg), SpO2 100 %.  LABORATORY DATA: Lab Results  Component Value Date   WBC 8.1 08/08/2018   HGB 10.5 (L) 08/08/2018   HCT 31.7 (L) 08/08/2018   MCV 94.1 08/08/2018   PLT 293 08/08/2018      Chemistry      Component Value Date/Time   NA 141 08/08/2018 1020   NA 140 07/12/2017 1212   K 3.5 08/08/2018 1020   K 3.5 07/12/2017 1212   CL 105 08/08/2018 1020   CO2 27 08/08/2018 1020   CO2 27 07/12/2017 1212   BUN 22 08/08/2018 1020   BUN 27.5 (H) 07/12/2017 1212   CREATININE 0.99 08/08/2018 1020   CREATININE 1.0 07/12/2017 1212      Component Value Date/Time   CALCIUM 9.1 08/08/2018 1020   CALCIUM 10.0 07/12/2017 1212   ALKPHOS 155 (H) 08/08/2018 1020   ALKPHOS 119 07/12/2017 1212   AST 15 08/08/2018 1020   AST 16 07/12/2017 1212   ALT 9 08/08/2018 1020   ALT 15 07/12/2017 1212   BILITOT 0.3 08/08/2018 1020   BILITOT 0.48 07/12/2017 1212       RADIOGRAPHIC STUDIES: Ct Chest W Contrast  Result Date: 07/11/2018 CLINICAL DATA:  Right-sided lung cancer diagnosed in 2015 and 2018. Squamous cell carcinoma. Status post chemotherapy. Dry cough and shortness of breath. EXAM: CT CHEST, ABDOMEN, AND PELVIS WITH CONTRAST TECHNIQUE: Multidetector CT imaging of the chest, abdomen and pelvis was performed following the standard protocol during bolus administration of intravenous contrast. CONTRAST:  123m OMNIPAQUE IOHEXOL 300 MG/ML  SOLN COMPARISON:  04/16/2018 chest CT. Most recent abdominopelvic CT 01/05/2017. FINDINGS: CT CHEST FINDINGS Cardiovascular: Right Port-A-Cath tip at low SVC. Aortic atherosclerosis. Normal heart size, without pericardial effusion. Multivessel coronary artery atherosclerosis. No central pulmonary embolism, on this non-dedicated study. Mediastinum/Nodes:  No supraclavicular adenopathy. Prior thyroidectomy. No mediastinal or hilar adenopathy. Prominent but not pathologically enlarged periesophageal nodes including at 7 mm on image 38/2, similar. Lungs/Pleura: No pleural fluid. Mild right-sided pleural thickening is unchanged. Patent airways. Mild centrilobular emphysema. Calcified right lower lobe granuloma. Probable scarring in the right lower lobe including on image 102/6. Left lower lobe nodules are similar, and may be calcified. The presumed left lower lobe infection has resolved. Areas of consolidation within the superior segment right lower lobe and right middle lobe are similar in distribution. More masslike inferior right middle lobe soft tissue density measures  3.8 x 3.2 cm on image 25/2, similar to 3.9 x 3.1 cm on the prior. Musculoskeletal: Lipoma within the left supraspinatus musculature. No acute osseous abnormality. CT ABDOMEN PELVIS FINDINGS Hepatobiliary: Normal liver. Normal gallbladder, without biliary ductal dilatation. Pancreas: Normal, without mass or ductal dilatation. Spleen: Normal in size, without focal abnormality. Adrenals/Urinary Tract: Normal adrenal glands. Interpolar right renal lesion is similar at 1.3 cm. There is also lower pole right renal lesion which is likely a cyst but is too small to characterize. Normal left kidney, without hydronephrosis. Normal urinary bladder. Stomach/Bowel: Tiny hiatal hernia. Normal distal stomach. Scattered colonic diverticula. Normal terminal ileum and appendix. Normal small bowel. Vascular/Lymphatic: Aortic and branch vessel atherosclerosis. No abdominopelvic adenopathy. Reproductive: Normal uterus and adnexa. Other: No significant free fluid. Mild pelvic floor laxity. No evidence of omental or peritoneal disease. Musculoskeletal: Incompletely imaged intramuscular lipoma within the proximal right thigh, grossly similar to 01/05/2017. On the order of 9.1 cm. Mild osteopenia. Degenerate disc disease which  is advanced at L2-3. Moderate right hemidiaphragm elevation. IMPRESSION: 1. Similar appearance of right middle lobe and superior segment right lower lobe areas of likely radiation induced consolidation. More masslike area in the inferior right middle lobe is unchanged. 2. No thoracic adenopathy or evidence of disease progression. 3. No acute process or evidence of metastatic disease in the abdomen or pelvis. 4. Right renal lesion is similar in size and better characterized on prior MRI of 04/19/2017. 5. Aortic atherosclerosis (ICD10-I70.0), coronary artery atherosclerosis and emphysema (ICD10-J43.9). 6.  Tiny hiatal hernia. Electronically Signed   By: Abigail Miyamoto M.D.   On: 07/11/2018 07:59   Ct Abdomen Pelvis W Contrast  Result Date: 07/11/2018 CLINICAL DATA:  Right-sided lung cancer diagnosed in 2015 and 2018. Squamous cell carcinoma. Status post chemotherapy. Dry cough and shortness of breath. EXAM: CT CHEST, ABDOMEN, AND PELVIS WITH CONTRAST TECHNIQUE: Multidetector CT imaging of the chest, abdomen and pelvis was performed following the standard protocol during bolus administration of intravenous contrast. CONTRAST:  174m OMNIPAQUE IOHEXOL 300 MG/ML  SOLN COMPARISON:  04/16/2018 chest CT. Most recent abdominopelvic CT 01/05/2017. FINDINGS: CT CHEST FINDINGS Cardiovascular: Right Port-A-Cath tip at low SVC. Aortic atherosclerosis. Normal heart size, without pericardial effusion. Multivessel coronary artery atherosclerosis. No central pulmonary embolism, on this non-dedicated study. Mediastinum/Nodes: No supraclavicular adenopathy. Prior thyroidectomy. No mediastinal or hilar adenopathy. Prominent but not pathologically enlarged periesophageal nodes including at 7 mm on image 38/2, similar. Lungs/Pleura: No pleural fluid. Mild right-sided pleural thickening is unchanged. Patent airways. Mild centrilobular emphysema. Calcified right lower lobe granuloma. Probable scarring in the right lower lobe including on  image 102/6. Left lower lobe nodules are similar, and may be calcified. The presumed left lower lobe infection has resolved. Areas of consolidation within the superior segment right lower lobe and right middle lobe are similar in distribution. More masslike inferior right middle lobe soft tissue density measures 3.8 x 3.2 cm on image 25/2, similar to 3.9 x 3.1 cm on the prior. Musculoskeletal: Lipoma within the left supraspinatus musculature. No acute osseous abnormality. CT ABDOMEN PELVIS FINDINGS Hepatobiliary: Normal liver. Normal gallbladder, without biliary ductal dilatation. Pancreas: Normal, without mass or ductal dilatation. Spleen: Normal in size, without focal abnormality. Adrenals/Urinary Tract: Normal adrenal glands. Interpolar right renal lesion is similar at 1.3 cm. There is also lower pole right renal lesion which is likely a cyst but is too small to characterize. Normal left kidney, without hydronephrosis. Normal urinary bladder. Stomach/Bowel: Tiny hiatal hernia. Normal distal stomach. Scattered colonic diverticula.  Normal terminal ileum and appendix. Normal small bowel. Vascular/Lymphatic: Aortic and branch vessel atherosclerosis. No abdominopelvic adenopathy. Reproductive: Normal uterus and adnexa. Other: No significant free fluid. Mild pelvic floor laxity. No evidence of omental or peritoneal disease. Musculoskeletal: Incompletely imaged intramuscular lipoma within the proximal right thigh, grossly similar to 01/05/2017. On the order of 9.1 cm. Mild osteopenia. Degenerate disc disease which is advanced at L2-3. Moderate right hemidiaphragm elevation. IMPRESSION: 1. Similar appearance of right middle lobe and superior segment right lower lobe areas of likely radiation induced consolidation. More masslike area in the inferior right middle lobe is unchanged. 2. No thoracic adenopathy or evidence of disease progression. 3. No acute process or evidence of metastatic disease in the abdomen or pelvis.  4. Right renal lesion is similar in size and better characterized on prior MRI of 04/19/2017. 5. Aortic atherosclerosis (ICD10-I70.0), coronary artery atherosclerosis and emphysema (ICD10-J43.9). 6.  Tiny hiatal hernia. Electronically Signed   By: Abigail Miyamoto M.D.   On: 07/11/2018 07:59    ASSESSMENT AND PLAN: This is a very pleasant 77 years old white female with recurrent non-small cell lung cancer, squamous cell carcinoma. The patient completed 6 cycles of systemic chemotherapy with carboplatin and paclitaxel and tolerated her treatment well except for fatigue as well as peripheral neuropathy. The patient has been in observation.  She continues to have persistent dry cough and shortness of breath. She had evidence for disease progression on restaging scan. The patient was started on treatment with second line immunotherapy with Nivolumab 480 mg IV every 4 weeks status post 7 cycles.  The patient continues to tolerate this treatment well with no concerning adverse effects. I recommended for her to proceed with cycle #8 today as scheduled. I will see her back for follow-up visit in 4 weeks for evaluation before starting cycle #9. The patient was advised to call immediately if she has any concerning symptoms in the interval. The patient voices understanding of current disease status and treatment options and is in agreement with the current care plan. All questions were answered. The patient knows to call the clinic with any problems, questions or concerns. We can certainly see the patient much sooner if necessary.   Disclaimer: This note was dictated with voice recognition software. Similar sounding words can inadvertently be transcribed and may not be corrected upon review.

## 2018-08-08 NOTE — Patient Instructions (Signed)
[facility] Discharge Instructions for Patients Receiving Chemotherapy  Today you received the following chemotherapy agents nivolumab (Opdivo).  To help prevent nausea and vomiting after your treatment, we encourage you to take your nausea medication as directed by your doctor.   If you develop nausea and vomiting that is not controlled by your nausea medication, call the clinic.   BELOW ARE SYMPTOMS THAT SHOULD BE REPORTED IMMEDIATELY:  *FEVER GREATER THAN 100.5 F  *CHILLS WITH OR WITHOUT FEVER  NAUSEA AND VOMITING THAT IS NOT CONTROLLED WITH YOUR NAUSEA MEDICATION  *UNUSUAL SHORTNESS OF BREATH  *UNUSUAL BRUISING OR BLEEDING  TENDERNESS IN MOUTH AND THROAT WITH OR WITHOUT PRESENCE OF ULCERS  *URINARY PROBLEMS  *BOWEL PROBLEMS  UNUSUAL RASH Items with * indicate a potential emergency and should be followed up as soon as possible.  Feel free to call the clinic should you have any questions or concerns. The clinic phone number is [phone].  Please show the [place] at check-in to the Emergency Department and triage nurse.

## 2018-08-09 ENCOUNTER — Telehealth: Payer: Self-pay | Admitting: Internal Medicine

## 2018-08-09 NOTE — Telephone Encounter (Signed)
3 cycles already scheduled per 12/5 los - no additional appts added.

## 2018-08-12 ENCOUNTER — Encounter: Payer: Self-pay | Admitting: Primary Care

## 2018-08-12 ENCOUNTER — Other Ambulatory Visit: Payer: Self-pay

## 2018-08-12 ENCOUNTER — Telehealth: Payer: Self-pay | Admitting: Primary Care

## 2018-08-12 MED ORDER — PREDNISONE 10 MG PO TABS: ORAL_TABLET | ORAL | 0 refills | Status: DC

## 2018-08-12 NOTE — Assessment & Plan Note (Signed)
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 Lake Bells, he recommends short prednisone course (20mg  x 1 week, 10mg  x 1 week)  Spirometry showed no clear restriction or obstruction  Unable to do FENO  Trial Asmanex - take 2 puffs twice a day (sample given) Continue Protonix twice a day  Continue Singulair and Flonase 2 sprays into both nostrils daily  Follow up in 2 weeks with Eustaquio Maize NP

## 2018-08-12 NOTE — Telephone Encounter (Signed)
Spoke with pt and relayed info.  F/U ok on 12/18 ok with Beth.  Nothing further needed at this time.

## 2018-08-12 NOTE — Telephone Encounter (Signed)
Can you please call patient and let her know I discussed her most recent visit with Dr. Lake Bells and he feels that a good deal of her cough is related to radiation induced fibrosis, he would like her to be on a short course of oral prednisone. 20mg  x 1 week and then 10mg  x 1 week. Follow up with me in 2 weeks. Thanks

## 2018-08-12 NOTE — Telephone Encounter (Signed)
Pt is calling back [phone]

## 2018-08-12 NOTE — Telephone Encounter (Signed)
LMOM to return call.

## 2018-08-13 NOTE — Progress Notes (Signed)
Reviewed, agree 

## 2018-08-14 DIAGNOSIS — Z23 Encounter for immunization: Secondary | ICD-10-CM | POA: Diagnosis not present

## 2018-08-21 ENCOUNTER — Encounter: Payer: Self-pay | Admitting: Primary Care

## 2018-08-21 ENCOUNTER — Ambulatory Visit (INDEPENDENT_AMBULATORY_CARE_PROVIDER_SITE_OTHER): Payer: Medicare Other | Admitting: Primary Care

## 2018-08-21 DIAGNOSIS — R05 Cough: Secondary | ICD-10-CM

## 2018-08-21 DIAGNOSIS — R059 Cough, unspecified: Secondary | ICD-10-CM

## 2018-08-21 MED ORDER — MOMETASONE FUROATE 100 MCG/ACT IN AERO: 2.0000 | INHALATION_SPRAY | Freq: Two times a day (BID) | RESPIRATORY_TRACT | 1 refills | Status: DC

## 2018-08-21 MED ORDER — MOMETASONE FUROATE 100 MCG/ACT IN AERO: 2.0000 | INHALATION_SPRAY | Freq: Two times a day (BID) | RESPIRATORY_TRACT | 3 refills | Status: AC

## 2018-08-21 NOTE — Assessment & Plan Note (Addendum)
-   Chronic cough, hx radiation induce lung fibrosis - Feels 100% better with no residual cough or wheeze  - Completed prednisone course x 2 weeks (20mg  x 1 wk; 10mg  x 1 wk) - Continues Asmanex 100, two puffs twice day; trial step down therapy  - FU in 4-6 months

## 2018-08-21 NOTE — Progress Notes (Signed)
@Patient  ID: Kimberly [name], female    DOB: 05-May-1941, 77 y.o.   MRN: [id number]  Chief Complaint  Patient presents with  . Follow-up    feels better    Referring provider: Deland Pretty, MD  Synopsis: Referred in May 2019 for cough in the setting of squamous cell carcinoma of the lung.  She had a VATS biopsy and mediastinal exploration in March 2015.  Tumor was found to be unresectable at that time.  Underwent concurrent chemoradiation starting in 2015.  Had consolidation chemotherapy with carboplatin and paclitaxel in June 2015.  Underwent systemic chemotherapy with carboplatin and paclitaxel in late 2017 through early 2018.  As of her first visit with the pulmonary clinic in May 2019 she was currently on immunotherapy with Nivolumab   HPI: Patient presents today for 4 week follow-up visit. States that she was some better with voice rest. Continues to have cough, mostly dry. She has had this cough for a long time, states worse since dx of lung cancer. Environmental changes affect her cough, including exposure to dusk and mold. Talking also seems to cause her to cough. Reports allergies to mold, dust and cats. She has seen an allergist in the past. Takes Singulair and Flonase. Continues to have baseline  shortness of breath. Uses nebulizer once a day which does help. PFTs in 2015 were normal. She was unable to do FENO today.  Spirometry today showed no clear restriction or obstruction.   08/21/2018 Patient presents today for 2 week follow-up. 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 Discussed with Dr. Lake Bells and he recommended a short prednisone course. She feels like a normal person again. Has been using Asmanex 100 twice daily as prescribed and feels it has really made a different. Has 3 days of prednisone left. Denies cough or wheeze.   Spirometry 08/12/2018 Trial 1- FVC 1.5 (70%), FEV1 1.1 (68%), ratio 73 Trial 1- FVC 2.6 (123%), FEV1 2.1 (132%), ratio 80 Trial  3- FVC 1.8 (83%), FEV1 1.5 (94%), ratio 84  Allergies  Allergen Reactions  . Lactose Intolerance (Gi) Other (See Comments)  . Amiodarone Other (See Comments)    Corneal deposits  . Augmentin [Amoxicillin-Pot Clavulanate] Diarrhea    *ended up with cdiff* Has patient had a PCN reaction causing immediate rash, facial/tongue/throat swelling, SOB or lightheadedness with hypotension: No Has patient had a PCN reaction causing severe rash involving mucus membranes or skin necrosis: No Has patient had a PCN reaction that required hospitalization: Yes Has patient had a PCN reaction occurring within the last 10 years: Yes If all of the above answers are "NO", then may proceed with Cephalosporin use.   . Milk-Related Compounds Other (See Comments)    GI upset, projectile vomiting - can't take any dairy products    Immunization History  Administered Date(s) Administered  . DTaP 06/04/2013  . H1N1 08/17/2008  . Influenza Split 06/04/2013, 10/29/2014  . Influenza, High Dose Seasonal PF 06/10/2018  . Influenza-Unspecified 06/04/2014, 07/06/2015, 06/21/2016, 06/26/2017  . Pneumococcal Polysaccharide-23 11/16/2013  . Zoster Recombinat (Shingrix) 06/11/2018    Past Medical History:  Diagnosis Date  . Allergic asthma without acute exacerbation or status asthmaticus   . Aortic insufficiency   . Arthritis   . Atrial fibrillation ([place])   . Back pain 08/14/2016  . Bronchitis   . Cough    dry, endobronchial mass  . Dyslipidemia   . Family history of adverse reaction to anesthesia    pts son also experiences  N/V  . GERD (gastroesophageal reflux disease)   . Heart murmur   . History of blood transfusion   . History of bronchitis   . History of chemotherapy   . History of nuclear stress test 02/26/2006   exercise myoview; normal pattern of perfusion; low risk scan   . Hypertension   . Hypothyroidism   . Mitral insufficiency   . Pneumonia   . PONV (postoperative nausea and vomiting)   .  Radiation 12/01/13-01/08/14   50.4 gray to right central chest  . Renal mass, left 04/12/2017  . Shortness of breath    with exertion  . Squamous cell carcinoma of lung ([place])   . Syncope    "states she has passed out a few times. dr is trying to find cause.last time when geeting ready to go home after video bronch/bx  . Thyroid disease     Tobacco History: Social History   Tobacco Use  Smoking Status Former Smoker  . Packs/day: 0.50  . Years: 6.00  . Pack years: 3.00  . Types: Cigarettes  . Last attempt to quit: 09/05/1967  . Years since quitting: 50.9  Smokeless Tobacco Never Used  Tobacco Comment   occ wine   Counseling given: Not Answered Comment: occ wine   Outpatient Medications Prior to Visit  Medication Sig Dispense Refill  . albuterol (PROVENTIL HFA;VENTOLIN HFA) 108 (90 BASE) MCG/ACT inhaler Inhale 1 puff into the lungs every 6 (six) hours as needed for wheezing or shortness of breath.     [name] amLODipine (NORVASC) 5 MG tablet Take 5 mg by mouth daily.  3  . atorvastatin (LIPITOR) 20 MG tablet Take 20 mg by mouth every morning.     . chlorthalidone (HYGROTON) 25 MG tablet TAKE 1 TABLET BY MOUTH  DAILY 90 tablet 1  . fluticasone (FLONASE) 50 MCG/ACT nasal spray Place 2 sprays into both nostrils daily.    [name] KLOR-CON 8 MEQ tablet Take 8 mEq every other day by mouth.  3  . levothyroxine (SYNTHROID, LEVOTHROID) 100 MCG tablet Take 100 mcg daily by mouth.    . lidocaine-prilocaine (EMLA) cream Apply 1 application topically as needed. (Patient taking differently: Apply 1 application topically as needed (for port access). ) 30 g 0  . metoprolol succinate (TOPROL-XL) 25 MG 24 hr tablet Take 25 mg by mouth daily.    . montelukast (SINGULAIR) 10 MG tablet Take 10 mg by mouth at bedtime.     . Multiple Vitamins-Iron (MULTIVITAMIN/IRON PO) Take 1 tablet by mouth daily with breakfast.     . pantoprazole (PROTONIX) 40 MG tablet TAKE 1 TABLET BY MOUTH TWO  TIMES DAILY 120 tablet 0  .  predniSONE (DELTASONE) 10 MG tablet 2 tabs daily x 1 week; then 1 tab daily x 1 week; then stop 21 tablet 0  . prochlorperazine (COMPAZINE) 10 MG tablet Take 1 tablet (10 mg total) by mouth every 6 (six) hours as needed for nausea or vomiting. 30 tablet 0  . traMADol (ULTRAM) 50 MG tablet Take 50 mg every 8 (eight) hours as needed by mouth. Take 1/2 tablet  3  . Mometasone Furoate (ASMANEX HFA) 100 MCG/ACT AERO Inhale 2 puffs into the lungs 2 (two) times daily. 13 g 1   No facility-administered medications prior to visit.     Review of Systems  Review of Systems  Constitutional: Negative.   Respiratory: Negative for cough, shortness of breath and wheezing.    Physical Exam  BP 140/70 (BP Location: Left  Arm, Cuff Size: Normal)   Pulse 61   Temp (!) 97.5 F (36.4 C)   Ht 4\' 10"  (1.473 m)   Wt 150 lb (68 kg)   SpO2 99%   BMI 31.35 kg/m  Physical Exam Constitutional:      Appearance: Normal appearance.  HENT:     Head: Normocephalic and atraumatic.     Nose: Nose normal.  Neck:     Musculoskeletal: Normal range of motion and neck supple.  Cardiovascular:     Rate and Rhythm: Normal rate and regular rhythm.  Pulmonary:     Effort: Pulmonary effort is normal. No respiratory distress.     Breath sounds: Normal breath sounds. No wheezing.  Skin:    General: Skin is warm and dry.  Neurological:     General: No focal deficit present.     Mental Status: She is alert and oriented to person, place, and time. Mental status is at baseline.  Psychiatric:        Mood and Affect: Mood normal.        Behavior: Behavior normal.        Thought Content: Thought content normal.        Judgment: Judgment normal.      Lab Results:  CBC    Component Value Date/Time   WBC 8.1 08/08/2018 1020   WBC 7.4 10/15/2017 1116   RBC 3.37 (L) 08/08/2018 1020   HGB 10.5 (L) 08/08/2018 1020   HGB 11.3 (L) 07/12/2017 1212   HCT 31.7 (L) 08/08/2018 1020   HCT 33.1 (L) 07/12/2017 1212   PLT 293  08/08/2018 1020   PLT 251 07/12/2017 1212   MCV 94.1 08/08/2018 1020   MCV 101.9 (H) 07/12/2017 1212   MCH 31.2 08/08/2018 1020   MCHC 33.1 08/08/2018 1020   RDW 13.0 08/08/2018 1020   RDW 12.3 07/12/2017 1212   LYMPHSABS 1.4 08/08/2018 1020   LYMPHSABS 1.4 07/12/2017 1212   MONOABS 0.5 08/08/2018 1020   MONOABS 0.6 07/12/2017 1212   EOSABS 0.4 08/08/2018 1020   EOSABS 0.2 07/12/2017 1212   BASOSABS 0.1 08/08/2018 1020   BASOSABS 0.1 07/12/2017 1212    BMET    Component Value Date/Time   NA 141 08/08/2018 1020   NA 140 07/12/2017 1212   K 3.5 08/08/2018 1020   K 3.5 07/12/2017 1212   CL 105 08/08/2018 1020   CO2 27 08/08/2018 1020   CO2 27 07/12/2017 1212   GLUCOSE 89 08/08/2018 1020   GLUCOSE 102 07/12/2017 1212   BUN 22 08/08/2018 1020   BUN 27.5 (H) 07/12/2017 1212   CREATININE 0.99 08/08/2018 1020   CREATININE 1.0 07/12/2017 1212   CALCIUM 9.1 08/08/2018 1020   CALCIUM 10.0 07/12/2017 1212   GFRNONAA 56 (L) 08/08/2018 1020   GFRAA >60 08/08/2018 1020    BNP No results found for: BNP  ProBNP    Component Value Date/Time   PROBNP 2,388.0 (H) 02/07/2014 2123    Imaging: No results found.   Assessment & Plan:   Asthma - Chronic cough, hx radiation induce lung fibrosis - Feels 100% better with no residual cough or wheeze  - Completed prednisone course x 2 weeks (20mg  x 1 wk; 10mg  x 1 wk) - Continues Asmanex 100, two puffs twice day; trial step down therapy  - FU in 4-6 months   Cough - Resolved  Martyn Ehrich, NP 08/21/2018

## 2018-08-21 NOTE — Progress Notes (Signed)
Reviewed, agree 

## 2018-08-21 NOTE — Assessment & Plan Note (Addendum)
Resolved

## 2018-08-21 NOTE — Patient Instructions (Addendum)
Refill Asmanex 100; Continue 2 puffs twice a day for an additional two weeks; then step down therapy to 1 puff twice a day. If cough returns go back to 2 puffs twice a day   Complete prednisone as prescribed  Follow up: 4-6 months with Dr. Lake Bells  Or sooner if symptoms return or worsen

## 2018-08-23 ENCOUNTER — Ambulatory Visit: Payer: Self-pay

## 2018-09-02 ENCOUNTER — Telehealth: Payer: Self-pay | Admitting: *Deleted

## 2018-09-02 NOTE — Telephone Encounter (Signed)
"  ARVIS MIGUEZ [phone]) calling to make sure I'm on track to receive Opdivo treatment Thursday.  After hours team informed me to take Zyrtec and tylenol for viral infection.  Temperature now = 97.0  F but the past three nights I feel achy, have a fever, take tylenol and wake up soaked.  T = 101.4  Saturday night.  Forcing myself to eat with no appetite.  Drinking water and fluids.  Congested cough with brown colored mucus."

## 2018-09-03 NOTE — Telephone Encounter (Signed)
No fever yesterday or today. "feel like I am so tired". Reiterated rest and fluids and take OTC cough medication. Pt voiced understanding.

## 2018-09-05 ENCOUNTER — Inpatient Hospital Stay: Payer: Medicare Other

## 2018-09-05 ENCOUNTER — Inpatient Hospital Stay (HOSPITAL_BASED_OUTPATIENT_CLINIC_OR_DEPARTMENT_OTHER): Payer: Medicare Other | Admitting: Internal Medicine

## 2018-09-05 ENCOUNTER — Inpatient Hospital Stay: Payer: Medicare Other | Attending: Internal Medicine

## 2018-09-05 ENCOUNTER — Encounter: Payer: Self-pay | Admitting: Internal Medicine

## 2018-09-05 ENCOUNTER — Telehealth: Payer: Self-pay

## 2018-09-05 VITALS — BP 144/63 | HR 82 | Temp 98.4°F | Resp 16 | Ht <= 58 in | Wt 149.0 lb

## 2018-09-05 DIAGNOSIS — I10 Essential (primary) hypertension: Secondary | ICD-10-CM

## 2018-09-05 DIAGNOSIS — E860 Dehydration: Secondary | ICD-10-CM

## 2018-09-05 DIAGNOSIS — Z5112 Encounter for antineoplastic immunotherapy: Secondary | ICD-10-CM | POA: Insufficient documentation

## 2018-09-05 DIAGNOSIS — C342 Malignant neoplasm of middle lobe, bronchus or lung: Secondary | ICD-10-CM

## 2018-09-05 DIAGNOSIS — C3490 Malignant neoplasm of unspecified part of unspecified bronchus or lung: Secondary | ICD-10-CM

## 2018-09-05 DIAGNOSIS — Z79899 Other long term (current) drug therapy: Secondary | ICD-10-CM | POA: Diagnosis not present

## 2018-09-05 DIAGNOSIS — R0602 Shortness of breath: Secondary | ICD-10-CM | POA: Diagnosis not present

## 2018-09-05 DIAGNOSIS — R05 Cough: Secondary | ICD-10-CM

## 2018-09-05 DIAGNOSIS — C3411 Malignant neoplasm of upper lobe, right bronchus or lung: Secondary | ICD-10-CM

## 2018-09-05 LAB — CBC WITH DIFFERENTIAL (CANCER CENTER ONLY)
Abs Immature Granulocytes: 0.04 10*3/uL (ref 0.00–0.07)
Basophils Absolute: 0 10*3/uL (ref 0.0–0.1)
Basophils Relative: 1 %
EOS ABS: 0.3 10*3/uL (ref 0.0–0.5)
Eosinophils Relative: 4 %
HCT: 33.9 % — ABNORMAL LOW (ref 36.0–46.0)
Hemoglobin: 11.1 g/dL — ABNORMAL LOW (ref 12.0–15.0)
Immature Granulocytes: 1 %
Lymphocytes Relative: 14 %
Lymphs Abs: 1.2 10*3/uL (ref 0.7–4.0)
MCH: 30.7 pg (ref 26.0–34.0)
MCHC: 32.7 g/dL (ref 30.0–36.0)
MCV: 93.6 fL (ref 80.0–100.0)
Monocytes Absolute: 0.5 10*3/uL (ref 0.1–1.0)
Monocytes Relative: 6 %
Neutro Abs: 6.7 10*3/uL (ref 1.7–7.7)
Neutrophils Relative %: 74 %
Platelet Count: 280 10*3/uL (ref 150–400)
RBC: 3.62 MIL/uL — ABNORMAL LOW (ref 3.87–5.11)
RDW: 12.7 % (ref 11.5–15.5)
WBC Count: 8.8 10*3/uL (ref 4.0–10.5)
nRBC: 0 % (ref 0.0–0.2)

## 2018-09-05 LAB — CMP (CANCER CENTER ONLY)
ALK PHOS: 156 U/L — AB (ref 38–126)
ALT: 11 U/L (ref 0–44)
AST: 13 U/L — ABNORMAL LOW (ref 15–41)
Albumin: 3 g/dL — ABNORMAL LOW (ref 3.5–5.0)
Anion gap: 11 (ref 5–15)
BUN: 23 mg/dL (ref 8–23)
CO2: 27 mmol/L (ref 22–32)
Calcium: 9.6 mg/dL (ref 8.9–10.3)
Chloride: 101 mmol/L (ref 98–111)
Creatinine: 1.41 mg/dL — ABNORMAL HIGH (ref 0.44–1.00)
GFR, Est AFR Am: 42 mL/min — ABNORMAL LOW (ref >60)
GFR, Estimated: 36 mL/min — ABNORMAL LOW (ref >60)
Glucose, Bld: 173 mg/dL — ABNORMAL HIGH (ref 70–99)
Potassium: 3.3 mmol/L — ABNORMAL LOW (ref 3.5–5.1)
Sodium: 139 mmol/L (ref 135–145)
Total Bilirubin: 0.5 mg/dL (ref 0.3–1.2)
Total Protein: 7.1 g/dL (ref 6.5–8.1)

## 2018-09-05 LAB — TSH: TSH: 2.696 u[IU]/mL (ref 0.308–3.960)

## 2018-09-05 MED ORDER — SODIUM CHLORIDE 0.9% FLUSH
10.0000 mL | INTRAVENOUS | Status: DC | PRN
  Administered 2018-09-05: 10 mL
  Filled 2018-09-05: qty 10

## 2018-09-05 MED ORDER — SODIUM CHLORIDE 0.9 % IV SOLN
Freq: Once | INTRAVENOUS | Status: DC
  Filled 2018-09-05: qty 250

## 2018-09-05 MED ORDER — SODIUM CHLORIDE 0.9 % IV SOLN
Freq: Once | INTRAVENOUS | Status: AC
  Administered 2018-09-05: 11:00:00 via INTRAVENOUS
  Filled 2018-09-05: qty 250

## 2018-09-05 MED ORDER — SODIUM CHLORIDE 0.9 % IV SOLN
480.0000 mg | Freq: Once | INTRAVENOUS | Status: AC
  Administered 2018-09-05: 480 mg via INTRAVENOUS
  Filled 2018-09-05: qty 48

## 2018-09-05 MED ORDER — HEPARIN SOD (PORK) LOCK FLUSH 100 UNIT/ML IV SOLN
500.0000 [IU] | Freq: Once | INTRAVENOUS | Status: AC | PRN
  Administered 2018-09-05: 500 [IU]
  Filled 2018-09-05: qty 5

## 2018-09-05 MED ORDER — SODIUM CHLORIDE 0.9 % IV SOLN
INTRAVENOUS | Status: AC
  Filled 2018-09-05: qty 250

## 2018-09-05 NOTE — Telephone Encounter (Signed)
Printed avs and calender of upcoming appointment. Per 1/2 los 

## 2018-09-05 NOTE — Patient Instructions (Signed)
[facility] Discharge Instructions for Patients Receiving Chemotherapy  Today you received the following chemotherapy agents Opdivo  To help prevent nausea and vomiting after your treatment, we encourage you to take your nausea medication as directed   If you develop nausea and vomiting that is not controlled by your nausea medication, call the clinic.   BELOW ARE SYMPTOMS THAT SHOULD BE REPORTED IMMEDIATELY:  *FEVER GREATER THAN 100.5 F  *CHILLS WITH OR WITHOUT FEVER  NAUSEA AND VOMITING THAT IS NOT CONTROLLED WITH YOUR NAUSEA MEDICATION  *UNUSUAL SHORTNESS OF BREATH  *UNUSUAL BRUISING OR BLEEDING  TENDERNESS IN MOUTH AND THROAT WITH OR WITHOUT PRESENCE OF ULCERS  *URINARY PROBLEMS  *BOWEL PROBLEMS  UNUSUAL RASH Items with * indicate a potential emergency and should be followed up as soon as possible.  Feel free to call the clinic should you have any questions or concerns. The clinic phone number is [phone].  Please show the CHEMO ALERT CARD at check-in to the Emergency Department and triage nurse.   

## 2018-09-05 NOTE — Progress Notes (Signed)
[facility] [phone]   Fax:([phone]) [place], MD [address] [place] [address]  DIAGNOSIS: Recurrent non-small cell lung cancer, squamous cell carcinoma initially diagnosed as unresectable a stage IIIa in February 2015.  PRIOR THERAPY: 1) Status post exploratory right VATS with mediastinal biopsy under the care of Dr. Roxan Hockey on 11/13/2013. The tumor was found to be unresectable at that time.[name] 2) Concurrent chemoradiation with weekly carboplatin for AUC of 2 and paclitaxel 45 mg/M2, status post 6 cycles with partial response. First cycle was given on 12/01/2013. 3) Consolidation chemotherapy with carboplatin for AUC of 5 and paclitaxel 175 mg/M2 every 3 weeks with Neulasta support. First dose 02/23/2014. Status post 3 cycles with partial response. 4) Systemic chemotherapy with carboplatin for AUC of 5 and paclitaxel 175 MG/M2 every 3 weeks with Neulasta support. Status post 6 cycles. First dose was given on 08/14/2016.  Last dose was giving 12/11/2016 with partial response.  CURRENT THERAPY: Second line treatment with immunotherapy with Nivolumab 480 mg IV every 4 weeks, first dose Jan 25, 2018.  Status post 8 cycles.  INTERVAL HISTORY: Kimberly [name] 78 y.o. female returns to the clinic today for follow-up visit.  The patient is feeling fine today with no concerning complaints.  She denied having any chest pain, shortness of breath, cough or hemoptysis.  She had flulike symptoms recently and the patient was treated with over-the-counter Tylenol and Zyrtec.  She is feeling much better today.  She denied having any weight loss or night sweats.  She has no nausea, vomiting, diarrhea or constipation.  She denied having any headache or visual changes.  She is here today for evaluation before starting cycle #9 of her treatment.  MEDICAL HISTORY: Past Medical History:  Diagnosis Date  . Allergic asthma  without acute exacerbation or status asthmaticus   . Aortic insufficiency   . Arthritis   . Atrial fibrillation ([place])   . Back pain 08/14/2016  . Bronchitis   . Cough    dry, endobronchial mass  . Dyslipidemia   . Family history of adverse reaction to anesthesia    pts son also experiences N/V  . GERD (gastroesophageal reflux disease)   . Heart murmur   . History of blood transfusion   . History of bronchitis   . History of chemotherapy   . History of nuclear stress test 02/26/2006   exercise myoview; normal pattern of perfusion; low risk scan   . Hypertension   . Hypothyroidism   . Mitral insufficiency   . Pneumonia   . PONV (postoperative nausea and vomiting)   . Radiation 12/01/13-01/08/14   50.4 gray to right central chest  . Renal mass, left 04/12/2017  . Shortness of breath    with exertion  . Squamous cell carcinoma of lung ([place])   . Syncope    "states she has passed out a few times. dr is trying to find cause.last time when geeting ready to go home after video bronch/bx  . Thyroid disease     ALLERGIES:  is allergic to lactose intolerance (gi); amiodarone; augmentin [amoxicillin-pot clavulanate]; and milk-related compounds.  MEDICATIONS:  Current Outpatient Medications  Medication Sig Dispense Refill  . albuterol (PROVENTIL HFA;VENTOLIN HFA) 108 (90 BASE) MCG/ACT inhaler Inhale 1 puff into the lungs every 6 (six) hours as needed for wheezing or shortness of breath.     [name] amLODipine (NORVASC) 5 MG tablet Take 5 mg  by mouth daily.  3  . atorvastatin (LIPITOR) 20 MG tablet Take 20 mg by mouth every morning.     . chlorthalidone (HYGROTON) 25 MG tablet TAKE 1 TABLET BY MOUTH  DAILY 90 tablet 1  . fluticasone (FLONASE) 50 MCG/ACT nasal spray Place 2 sprays into both nostrils daily.    [name] KLOR-CON 8 MEQ tablet Take 8 mEq every other day by mouth.  3  . levothyroxine (SYNTHROID, LEVOTHROID) 100 MCG tablet Take 100 mcg daily by mouth.    . lidocaine-prilocaine (EMLA) cream  Apply 1 application topically as needed. (Patient taking differently: Apply 1 application topically as needed (for port access). ) 30 g 0  . metoprolol succinate (TOPROL-XL) 25 MG 24 hr tablet Take 25 mg by mouth daily.    . Mometasone Furoate (ASMANEX HFA) 100 MCG/ACT AERO Inhale 2 puffs into the lungs 2 (two) times daily. 39 g 3  . montelukast (SINGULAIR) 10 MG tablet Take 10 mg by mouth at bedtime.     . Multiple Vitamins-Iron (MULTIVITAMIN/IRON PO) Take 1 tablet by mouth daily with breakfast.     . pantoprazole (PROTONIX) 40 MG tablet TAKE 1 TABLET BY MOUTH TWO  TIMES DAILY 120 tablet 0  . prochlorperazine (COMPAZINE) 10 MG tablet Take 1 tablet (10 mg total) by mouth every 6 (six) hours as needed for nausea or vomiting. (Patient not taking: Reported on 09/05/2018) 30 tablet 0  . traMADol (ULTRAM) 50 MG tablet Take 50 mg every 8 (eight) hours as needed by mouth. Take 1/2 tablet  3   No current facility-administered medications for this visit.     SURGICAL HISTORY:  Past Surgical History:  Procedure Laterality Date  . CARDIAC CATHETERIZATION  07/08/2008   normal coronaries  . CARPAL TUNNEL RELEASE Right 1988  . COLONOSCOPY N/A 02/11/2016   Procedure: COLONOSCOPY;  Surgeon: Carol Ada, MD;  Location: [facility];  Service: Endoscopy;  Laterality: N/A;  . DILATION AND CURETTAGE OF UTERUS    . ESOPHAGOGASTRODUODENOSCOPY N/A 02/08/2017   Procedure: ESOPHAGOGASTRODUODENOSCOPY (EGD);  Surgeon: Carol Ada, MD;  Location: [facility];  Service: Endoscopy;  Laterality: N/A;  . EYE SURGERY Bilateral   . H/O MET Test w/PFT  04/02/2012   low risk; peak VO2 77% predicted  . IR GENERIC HISTORICAL  09/12/2016   IR FLUORO GUIDE PORT INSERTION RIGHT 09/12/2016 [name], MD [facility]  . IR GENERIC HISTORICAL  09/12/2016   IR US GUIDE VASC ACCESS RIGHT 09/12/2016 [name], MD [facility]  . JOINT REPLACEMENT  2003   thumb rt  . KNEE ARTHROSCOPY  12   rt meniscus  . MEDIASTINOSCOPY N/A  10/30/2013   Procedure: MEDIASTINOSCOPY;  Surgeon: Melrose Nakayama, MD;  Location: [facility];  Service: Thoracic;  Laterality: N/A;  . ROTATOR CUFF REPAIR  2006   ? side  . THYROIDECTOMY  1973  . TRANSTHORACIC ECHOCARDIOGRAM  09/25/2012   EF 55-60%; mild LVH & mild concentric hypertrophy; mild AV regurg; RV systolic pressure increase consistent with mild pulm HTN  . VIDEO ASSISTED THORACOSCOPY (VATS)/ LOBECTOMY Right 11/13/2013   Procedure: VIDEO ASSISTED THORACOSCOPY (VATS) with mediastinal  biopsies;  Surgeon: Melrose Nakayama, MD;  Location: [facility];  Service: Thoracic;  Laterality: Right;  RIGHT VATS,mediastinal biopsies  . VIDEO BRONCHOSCOPY Bilateral 09/25/2013   Procedure: VIDEO BRONCHOSCOPY WITHOUT FLUORO;  Surgeon: Tanda Rockers, MD;  Location: [facility];  Service: Cardiopulmonary;  Laterality: Bilateral;  . VIDEO BRONCHOSCOPY WITH ENDOBRONCHIAL ULTRASOUND N/A 10/30/2013   Procedure: VIDEO  BRONCHOSCOPY WITH ENDOBRONCHIAL ULTRASOUND;  Surgeon: Melrose Nakayama, MD;  Location: [facility];  Service: Thoracic;  Laterality: N/A;    REVIEW OF SYSTEMS:  A comprehensive review of systems was negative except for: Constitutional: positive for fatigue Respiratory: positive for cough and wheezing   PHYSICAL EXAMINATION: General appearance: alert, cooperative, fatigued and no distress Head: Normocephalic, without obvious abnormality, atraumatic Neck: no adenopathy, no JVD, supple, symmetrical, trachea midline and thyroid not enlarged, symmetric, no tenderness/mass/nodules Lymph nodes: Cervical, supraclavicular, and axillary nodes normal. Resp: wheezes bilaterally Back: symmetric, no curvature. ROM normal. No CVA tenderness. Cardio: regular rate and rhythm, S1, S2 normal, no murmur, click, rub or gallop GI: soft, non-tender; bowel sounds normal; no masses,  no organomegaly Extremities: extremities normal, atraumatic, no cyanosis or edema  ECOG PERFORMANCE STATUS: 1 - Symptomatic but  completely ambulatory  Blood pressure (!) 144/63, pulse 82, temperature 98.4 F (36.9 C), temperature source Oral, resp. rate 16, height _0  (1.473 m), weight 149 lb (67.6 kg), SpO2 99 %.  LABORATORY DATA: Lab Results  Component Value Date   WBC 8.8 09/05/2018   HGB 11.1 (L) 09/05/2018   HCT 33.9 (L) 09/05/2018   MCV 93.6 09/05/2018   PLT 280 09/05/2018      Chemistry      Component Value Date/Time   NA 139 09/05/2018 1000   NA 140 07/12/2017 1212   K 3.3 (L) 09/05/2018 1000   K 3.5 07/12/2017 1212   CL 101 09/05/2018 1000   CO2 27 09/05/2018 1000   CO2 27 07/12/2017 1212   BUN 23 09/05/2018 1000   BUN 27.5 (H) 07/12/2017 1212   CREATININE 1.41 (H) 09/05/2018 1000   CREATININE 1.0 07/12/2017 1212      Component Value Date/Time   CALCIUM 9.6 09/05/2018 1000   CALCIUM 10.0 07/12/2017 1212   ALKPHOS 156 (H) 09/05/2018 1000   ALKPHOS 119 07/12/2017 1212   AST 13 (L) 09/05/2018 1000   AST 16 07/12/2017 1212   ALT 11 09/05/2018 1000   ALT 15 07/12/2017 1212   BILITOT 0.5 09/05/2018 1000   BILITOT 0.48 07/12/2017 1212       RADIOGRAPHIC STUDIES: No results found.  ASSESSMENT AND PLAN: This is a very pleasant 78 years old white female with recurrent non-small cell lung cancer, squamous cell carcinoma. The patient completed 6 cycles of systemic chemotherapy with carboplatin and paclitaxel and tolerated her treatment well except for fatigue as well as peripheral neuropathy. The patient has been in observation.  She continues to have persistent dry cough and shortness of breath. She had evidence for disease progression on restaging scan. The patient was started on treatment with second line immunotherapy with Nivolumab 480 mg IV every 4 weeks status post 8 cycles.  The patient has been tolerating her treatment well with no concerning adverse effects. I recommended for her to proceed with cycle #9 today as a schedule. I will see her back for follow-up visit in 4 weeks  for evaluation after repeating CT scan of the chest, abdomen and pelvis for restaging of her disease. For the dehydration with recent flulike symptoms, I will arrange for the patient to receive 500 cc of normal saline IV today.  She was also advised to increase her oral intake of fluid. The patient was advised to call immediately if she has any concerning symptoms in the interval. The patient voices understanding of current disease status and treatment options and is in agreement with the current care plan. All questions  were answered. The patient knows to call the clinic with any problems, questions or concerns. We can certainly see the patient much sooner if necessary.   Disclaimer: This note was dictated with voice recognition software. Similar sounding words can inadvertently be transcribed and may not be corrected upon review.

## 2018-09-16 DIAGNOSIS — N2889 Other specified disorders of kidney and ureter: Secondary | ICD-10-CM | POA: Diagnosis not present

## 2018-09-16 DIAGNOSIS — N289 Disorder of kidney and ureter, unspecified: Secondary | ICD-10-CM | POA: Diagnosis not present

## 2018-09-18 ENCOUNTER — Telehealth: Payer: Self-pay | Admitting: Pulmonary Disease

## 2018-09-18 NOTE — Telephone Encounter (Signed)
Called patient, phone rang busy. Unable to reach. I check with Caryl Pina and checked in BQ box and did not see any paperwork.

## 2018-09-19 NOTE — Telephone Encounter (Signed)
ATC, Line busy

## 2018-09-23 ENCOUNTER — Ambulatory Visit: Payer: Self-pay

## 2018-09-23 NOTE — Telephone Encounter (Signed)
ATC pt, line rang busy x2.

## 2018-09-25 NOTE — Telephone Encounter (Signed)
Called and spoke with pt stating to her that we have not been able to locate the patient assistance application for the asmanex that she had brought in. Stated to her that we have checked with BQ's nurse and she does not have the forms that were brought in by her.  Asked pt when it was that she brought the forms in and pt stated it was last Wednesday, 09/18/2018 and she handed the forms to [facility] who stated to her that she was going to place them in BQ's box.  [place], can you help [place] out with this as we have been unable to locate the forms that pt brought in. Thanks!

## 2018-09-25 NOTE — Telephone Encounter (Signed)
I never received an application for this patient, sorry!

## 2018-09-25 NOTE — Telephone Encounter (Signed)
Call made to patient, she states she filled out a patient assistance application for Asmanex HFA inhaler. She states without the patient assistance it will cost her $190 monthly. Will route message to and [place] for update.   Caryl Pina any update regarding this patients application?

## 2018-09-25 NOTE — Telephone Encounter (Signed)
Called patient, unable to reach LMTCB 

## 2018-09-25 NOTE — Telephone Encounter (Signed)
Patient returned call, CB is [phone]

## 2018-09-26 ENCOUNTER — Other Ambulatory Visit: Payer: Self-pay

## 2018-09-26 NOTE — Patient Outreach (Signed)
Kimberly [name] [facility]) Care Management  09/26/2018   Kimberly [name] [birth year] [id number]  Subjective: Successful outreach to the patient for assessment.  HIPAA verified.  The patient states she is doing fair.  She denies any pain or falls.  The patient states that her blood pressures have been doing well.  They have been ranging from 527- 782 systolic to 42-35 diastolic. She states that her diet is good but her physician would like her to eat more.  She is taking her medications as prescribed.  The patient is scheduled to have a ct on 1/27 infusion on 1/30 and see Dr. Lake Bells on 4/20.  Current Medications:  Current Outpatient Medications  Medication Sig Dispense Refill  . albuterol (PROVENTIL HFA;VENTOLIN HFA) 108 (90 BASE) MCG/ACT inhaler Inhale 1 puff into the lungs every 6 (six) hours as needed for wheezing or shortness of breath.     [name] amLODipine (NORVASC) 5 MG tablet Take 5 mg by mouth daily.  3  . atorvastatin (LIPITOR) 20 MG tablet Take 20 mg by mouth every morning.     . chlorthalidone (HYGROTON) 25 MG tablet TAKE 1 TABLET BY MOUTH  DAILY 90 tablet 1  . fluticasone (FLONASE) 50 MCG/ACT nasal spray Place 2 sprays into both nostrils daily.    [name] KLOR-CON 8 MEQ tablet Take 8 mEq every other day by mouth.  3  . levothyroxine (SYNTHROID, LEVOTHROID) 100 MCG tablet Take 100 mcg daily by mouth.    . lidocaine-prilocaine (EMLA) cream Apply 1 application topically as needed. (Patient taking differently: Apply 1 application topically as needed (for port access). ) 30 g 0  . metoprolol succinate (TOPROL-XL) 25 MG 24 hr tablet Take 25 mg by mouth daily.    . Mometasone Furoate (ASMANEX HFA) 100 MCG/ACT AERO Inhale 2 puffs into the lungs 2 (two) times daily. 39 g 3  . montelukast (SINGULAIR) 10 MG tablet Take 10 mg by mouth at bedtime.     . Multiple Vitamins-Iron (MULTIVITAMIN/IRON PO) Take 1 tablet by mouth daily with breakfast.     . pantoprazole (PROTONIX) 40 MG tablet TAKE 1 TABLET  BY MOUTH TWO  TIMES DAILY 120 tablet 0  . traMADol (ULTRAM) 50 MG tablet Take 50 mg every 8 (eight) hours as needed by mouth. Take 1/2 tablet  3  . prochlorperazine (COMPAZINE) 10 MG tablet Take 1 tablet (10 mg total) by mouth every 6 (six) hours as needed for nausea or vomiting. (Patient not taking: Reported on 09/05/2018) 30 tablet 0   No current facility-administered medications for this visit.     Functional Status:  In your present state of health, do you have any difficulty performing the following activities: 03/28/2018  Hearing? N  Vision? N  Difficulty concentrating or making decisions? N  Walking or climbing stairs? Y  Comment repiratory issues  Dressing or bathing? N  Doing errands, shopping? N  Preparing Food and eating ? N  Using the Toilet? N  In the past six months, have you accidently leaked urine? N  Do you have problems with loss of bowel control? N  Managing your Medications? N  Managing your Finances? N  Housekeeping or managing your Housekeeping? N  Some recent data might be hidden    Fall/Depression Screening: Fall Risk  09/26/2018 07/24/2018 05/03/2018  Falls in the past year? 0 0 No  Number falls in past yr: - - -  Comment - - -  Injury with Fall? - - -  Risk Factor Category  - - -  Risk for fall due to : - - -  Risk for fall due to: Comment - - -   PHQ 2/9 Scores 03/28/2018  PHQ - 2 Score 1    Assessment: Patient will continue to benefit from health coach outreach for disease management and support. THN CM Care Plan Problem One     Most Recent Value  THN Long Term Goal   In 60 days the patient will verbalize that her blood pressure remians in the 140's  THN Long Term Goal Start Date  09/26/18  Interventions for Problem One Long Term Goal  reviewed signs and symptoms of HTN, diet, and medication adherence.       Plan: RN Health Coach will contact patient in the month of March and patient agrees to next outreach.   [name] RN, BSN, [place] Direct Dial:  [phone]  Fax: [phone]

## 2018-09-30 ENCOUNTER — Ambulatory Visit (HOSPITAL_COMMUNITY)
Admission: RE | Admit: 2018-09-30 | Discharge: 2018-09-30 | Disposition: A | Discharge status: Home / Self Care | Payer: Medicare Other | Source: Ambulatory Visit | Attending: Internal Medicine | Admitting: Internal Medicine

## 2018-09-30 DIAGNOSIS — C3490 Malignant neoplasm of unspecified part of unspecified bronchus or lung: Secondary | ICD-10-CM | POA: Diagnosis not present

## 2018-09-30 MED ORDER — IOHEXOL 300 MG/ML  SOLN
100.0000 mL | Freq: Once | INTRAMUSCULAR | Status: AC | PRN
  Administered 2018-09-30: 80 mL via INTRAVENOUS

## 2018-09-30 MED ORDER — SODIUM CHLORIDE (PF) 0.9 % IJ SOLN
INTRAMUSCULAR | Status: AC
  Filled 2018-09-30: qty 50

## 2018-09-30 MED ORDER — HEPARIN SOD (PORK) LOCK FLUSH 100 UNIT/ML IV SOLN
500.0000 [IU] | Freq: Once | INTRAVENOUS | Status: AC
  Administered 2018-09-30: 500 [IU] via INTRAVENOUS

## 2018-09-30 MED ORDER — HEPARIN SOD (PORK) LOCK FLUSH 100 UNIT/ML IV SOLN
INTRAVENOUS | Status: AC
  Filled 2018-09-30: qty 5

## 2018-10-01 ENCOUNTER — Telehealth: Payer: Self-pay | Admitting: *Deleted

## 2018-10-01 NOTE — Telephone Encounter (Signed)
"  [name] with [facility] with call report for CT chest, abdomen and pelvis to share with provider."     IMPRESSION: Chest Impression:  1. Measurable increase in size of RIGHT middle lobe mass with peripheral enhancement. Findings concerning for lung cancer recurrence. Consider FDG PET scan for further evaluation.  Abdomen / Pelvis Impression:  1. No evidence of metastatic disease in the abdomen pelvis. 2. Slowly enlarging small RIGHT renal lesion. Consider urology consultation at some point.  Kimberly [name] is scheduled for provider F/U 10-03-2018.

## 2018-10-02 NOTE — Telephone Encounter (Signed)
Forms filed out and signed by Patient are in Dr. Anastasia Pall box.    Will route to [place] to follow up

## 2018-10-02 NOTE — Telephone Encounter (Signed)
Call made to patient, made aware we are unable to locate any paper work and we will need to fill out another application. Apologies were made and this patient was very thankful. Application left upfront to have patient sign.

## 2018-10-02 NOTE — Telephone Encounter (Signed)
Patient returned call, CB is [phone] this am.  States will not be able to get to the phone this afternoon.

## 2018-10-02 NOTE — Telephone Encounter (Signed)
Patient came by to fill out forms. Forms are in BQ folder. CB is [phone]

## 2018-10-02 NOTE — Telephone Encounter (Signed)
LMTCB x1 for pt.  New forms will need to be completed to ensure that the pt gets what she needs.

## 2018-10-03 ENCOUNTER — Inpatient Hospital Stay: Payer: Medicare Other

## 2018-10-03 ENCOUNTER — Encounter: Payer: Self-pay | Admitting: Internal Medicine

## 2018-10-03 ENCOUNTER — Inpatient Hospital Stay (HOSPITAL_BASED_OUTPATIENT_CLINIC_OR_DEPARTMENT_OTHER): Payer: Medicare Other | Admitting: Internal Medicine

## 2018-10-03 VITALS — BP 131/87 | HR 80 | Temp 98.6°F | Resp 20 | Ht <= 58 in | Wt 151.1 lb

## 2018-10-03 DIAGNOSIS — R0609 Other forms of dyspnea: Secondary | ICD-10-CM

## 2018-10-03 DIAGNOSIS — C3411 Malignant neoplasm of upper lobe, right bronchus or lung: Secondary | ICD-10-CM

## 2018-10-03 DIAGNOSIS — C342 Malignant neoplasm of middle lobe, bronchus or lung: Secondary | ICD-10-CM | POA: Diagnosis not present

## 2018-10-03 DIAGNOSIS — R5383 Other fatigue: Secondary | ICD-10-CM | POA: Diagnosis not present

## 2018-10-03 DIAGNOSIS — E860 Dehydration: Secondary | ICD-10-CM | POA: Diagnosis not present

## 2018-10-03 DIAGNOSIS — Z5112 Encounter for antineoplastic immunotherapy: Secondary | ICD-10-CM | POA: Diagnosis not present

## 2018-10-03 DIAGNOSIS — Z79899 Other long term (current) drug therapy: Secondary | ICD-10-CM | POA: Diagnosis not present

## 2018-10-03 DIAGNOSIS — C3490 Malignant neoplasm of unspecified part of unspecified bronchus or lung: Secondary | ICD-10-CM

## 2018-10-03 DIAGNOSIS — R05 Cough: Secondary | ICD-10-CM

## 2018-10-03 DIAGNOSIS — I10 Essential (primary) hypertension: Secondary | ICD-10-CM

## 2018-10-03 DIAGNOSIS — Z95828 Presence of other vascular implants and grafts: Secondary | ICD-10-CM

## 2018-10-03 LAB — CMP (CANCER CENTER ONLY)
ALT: 8 U/L (ref 0–44)
AST: 10 U/L — ABNORMAL LOW (ref 15–41)
Albumin: 3.1 g/dL — ABNORMAL LOW (ref 3.5–5.0)
Alkaline Phosphatase: 151 U/L — ABNORMAL HIGH (ref 38–126)
Anion gap: 10 (ref 5–15)
BUN: 16 mg/dL (ref 8–23)
CO2: 28 mmol/L (ref 22–32)
CREATININE: 0.96 mg/dL (ref 0.44–1.00)
Calcium: 9.5 mg/dL (ref 8.9–10.3)
Chloride: 101 mmol/L (ref 98–111)
GFR, Est AFR Am: >60 mL/min (ref >60)
GFR, Estimated: 58 mL/min — ABNORMAL LOW (ref >60)
Glucose, Bld: 121 mg/dL — ABNORMAL HIGH (ref 70–99)
Potassium: 3.8 mmol/L (ref 3.5–5.1)
Sodium: 139 mmol/L (ref 135–145)
Total Bilirubin: 0.5 mg/dL (ref 0.3–1.2)
Total Protein: 7.1 g/dL (ref 6.5–8.1)

## 2018-10-03 LAB — CBC WITH DIFFERENTIAL (CANCER CENTER ONLY)
Abs Immature Granulocytes: 0.07 10*3/uL (ref 0.00–0.07)
Basophils Absolute: 0.1 10*3/uL (ref 0.0–0.1)
Basophils Relative: 1 %
EOS PCT: 15 %
Eosinophils Absolute: 1.7 10*3/uL — ABNORMAL HIGH (ref 0.0–0.5)
HCT: 31.4 % — ABNORMAL LOW (ref 36.0–46.0)
Hemoglobin: 10.3 g/dL — ABNORMAL LOW (ref 12.0–15.0)
Immature Granulocytes: 1 %
Lymphocytes Relative: 12 %
Lymphs Abs: 1.4 10*3/uL (ref 0.7–4.0)
MCH: 30.9 pg (ref 26.0–34.0)
MCHC: 32.8 g/dL (ref 30.0–36.0)
MCV: 94.3 fL (ref 80.0–100.0)
Monocytes Absolute: 0.7 10*3/uL (ref 0.1–1.0)
Monocytes Relative: 6 %
NRBC: 0 % (ref 0.0–0.2)
Neutro Abs: 7.4 10*3/uL (ref 1.7–7.7)
Neutrophils Relative %: 65 %
Platelet Count: 341 10*3/uL (ref 150–400)
RBC: 3.33 MIL/uL — ABNORMAL LOW (ref 3.87–5.11)
RDW: 13 % (ref 11.5–15.5)
WBC Count: 11.3 10*3/uL — ABNORMAL HIGH (ref 4.0–10.5)

## 2018-10-03 LAB — TSH: TSH: 2.897 u[IU]/mL (ref 0.308–3.960)

## 2018-10-03 MED ORDER — SODIUM CHLORIDE 0.9% FLUSH
10.0000 mL | INTRAVENOUS | Status: DC | PRN
  Administered 2018-10-03: 10 mL
  Filled 2018-10-03: qty 10

## 2018-10-03 MED ORDER — HEPARIN SOD (PORK) LOCK FLUSH 100 UNIT/ML IV SOLN
500.0000 [IU] | Freq: Once | INTRAVENOUS | Status: AC | PRN
  Administered 2018-10-03: 500 [IU]
  Filled 2018-10-03: qty 5

## 2018-10-03 MED ORDER — SODIUM CHLORIDE 0.9% FLUSH
10.0000 mL | INTRAVENOUS | Status: DC | PRN
  Administered 2018-10-03: 10 mL via INTRAVENOUS
  Filled 2018-10-03: qty 10

## 2018-10-03 MED ORDER — SODIUM CHLORIDE 0.9 % IV SOLN
480.0000 mg | Freq: Once | INTRAVENOUS | Status: AC
  Administered 2018-10-03: 480 mg via INTRAVENOUS
  Filled 2018-10-03: qty 48

## 2018-10-03 MED ORDER — SODIUM CHLORIDE 0.9 % IV SOLN
Freq: Once | INTRAVENOUS | Status: AC
  Administered 2018-10-03: 11:00:00 via INTRAVENOUS
  Filled 2018-10-03: qty 250

## 2018-10-03 NOTE — Progress Notes (Signed)
[facility] [phone]   Fax:([phone]) [place], [place] [address] 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  PRIOR THERAPY: 1) Status post exploratory right VATS with mediastinal biopsy under the care of Dr. Roxan Hockey on 11/13/2013. The tumor was found to be unresectable at that time.[name] 2) Concurrent chemoradiation with weekly carboplatin for AUC of 2 and paclitaxel 45 mg/M2, status post 6 cycles with partial response. First cycle was given on 12/01/2013. 3) Consolidation chemotherapy with carboplatin for AUC of 5 and paclitaxel 175 mg/M2 every 3 weeks with Neulasta support. First dose 02/23/2014. Status post 3 cycles with partial response. 4) Systemic chemotherapy with carboplatin for AUC of 5 and paclitaxel 175 MG/M2 every 3 weeks with Neulasta support. Status post 6 cycles. First dose was given on 08/14/2016.  Last dose was giving 12/11/2016 with partial response.  CURRENT THERAPY: Second line treatment with immunotherapy with Nivolumab 480 mg IV every 4 weeks, first dose Jan 25, 2018.  Status post 9 cycles.  INTERVAL HISTORY: Kimberly [name] 78 y.o. female returns to the clinic today for follow-up visit.  The patient is feeling fine today with no concerning complaints except for fatigue and shortness of breath with exertion.  She denied having any chest pain, cough or hemoptysis.  She denied having any recent weight loss or night sweats.  She has no nausea, vomiting, diarrhea or constipation.  She continues to tolerate her treatment with nivolumab fairly well.  She had repeat CT scan of the chest, abdomen and pelvis performed recently and she is here for evaluation and discussion of her scan results.  MEDICAL HISTORY: Past Medical History:  Diagnosis Date  . Allergic asthma without acute  exacerbation or status asthmaticus   . Aortic insufficiency   . Arthritis   . Atrial fibrillation ([place])   . Back pain 08/14/2016  . Bronchitis   . Cough    dry, endobronchial mass  . Dyslipidemia   . Family history of adverse reaction to anesthesia    pts son also experiences N/V  . GERD (gastroesophageal reflux disease)   . Heart murmur   . History of blood transfusion   . History of bronchitis   . History of chemotherapy   . History of nuclear stress test 02/26/2006   exercise myoview; normal pattern of perfusion; low risk scan   . Hypertension   . Hypothyroidism   . Mitral insufficiency   . Pneumonia   . PONV (postoperative nausea and vomiting)   . Radiation 12/01/13-01/08/14   50.4 gray to right central chest  . Renal mass, left 04/12/2017  . Shortness of breath    with exertion  . Squamous cell carcinoma of lung ([place])   . Syncope    "states she has passed out a few times. dr is trying to find cause.last time when geeting ready to go home after video bronch/bx  . Thyroid disease     ALLERGIES:  is allergic to lactose intolerance (gi); amiodarone; augmentin [amoxicillin-pot clavulanate]; and milk-related compounds.  MEDICATIONS:  Current Outpatient Medications  Medication Sig Dispense Refill  . albuterol (PROVENTIL HFA;VENTOLIN HFA) 108 (90 BASE) MCG/ACT inhaler Inhale 1 puff into the lungs every 6 (six) hours as needed for wheezing or shortness of breath.     [name] amLODipine (NORVASC) 5 MG tablet Take 5 mg by mouth  daily.  3  . atorvastatin (LIPITOR) 20 MG tablet Take 20 mg by mouth every morning.     . chlorthalidone (HYGROTON) 25 MG tablet TAKE 1 TABLET BY MOUTH  DAILY 90 tablet 1  . fluticasone (FLONASE) 50 MCG/ACT nasal spray Place 2 sprays into both nostrils daily.    [name] KLOR-CON 8 MEQ tablet Take 8 mEq every other day by mouth.  3  . levothyroxine (SYNTHROID, LEVOTHROID) 100 MCG tablet Take 100 mcg daily by mouth.    . lidocaine-prilocaine (EMLA) cream Apply 1  application topically as needed. (Patient taking differently: Apply 1 application topically as needed (for port access). ) 30 g 0  . metoprolol succinate (TOPROL-XL) 25 MG 24 hr tablet Take 25 mg by mouth daily.    . Mometasone Furoate (ASMANEX HFA) 100 MCG/ACT AERO Inhale 2 puffs into the lungs 2 (two) times daily. 39 g 3  . montelukast (SINGULAIR) 10 MG tablet Take 10 mg by mouth at bedtime.     . Multiple Vitamins-Iron (MULTIVITAMIN/IRON PO) Take 1 tablet by mouth daily with breakfast.     . pantoprazole (PROTONIX) 40 MG tablet TAKE 1 TABLET BY MOUTH TWO  TIMES DAILY 120 tablet 0  . prochlorperazine (COMPAZINE) 10 MG tablet Take 1 tablet (10 mg total) by mouth every 6 (six) hours as needed for nausea or vomiting. (Patient not taking: Reported on 09/05/2018) 30 tablet 0  . traMADol (ULTRAM) 50 MG tablet Take 50 mg every 8 (eight) hours as needed by mouth. Take 1/2 tablet  3   No current facility-administered medications for this visit.     SURGICAL HISTORY:  Past Surgical History:  Procedure Laterality Date  . CARDIAC CATHETERIZATION  07/08/2008   normal coronaries  . CARPAL TUNNEL RELEASE Right 1988  . COLONOSCOPY N/A 02/11/2016   Procedure: COLONOSCOPY;  Surgeon: Carol Ada, MD;  Location: [facility];  Service: Endoscopy;  Laterality: N/A;  . DILATION AND CURETTAGE OF UTERUS    . ESOPHAGOGASTRODUODENOSCOPY N/A 02/08/2017   Procedure: ESOPHAGOGASTRODUODENOSCOPY (EGD);  Surgeon: Carol Ada, MD;  Location: [facility];  Service: Endoscopy;  Laterality: N/A;  . EYE SURGERY Bilateral   . H/O MET Test w/PFT  04/02/2012   low risk; peak VO2 77% predicted  . IR GENERIC HISTORICAL  09/12/2016   IR FLUORO GUIDE PORT INSERTION RIGHT 09/12/2016 [name], MD [facility]  . IR GENERIC HISTORICAL  09/12/2016   IR US GUIDE VASC ACCESS RIGHT 09/12/2016 [name], MD [facility]  . JOINT REPLACEMENT  2003   thumb rt  . KNEE ARTHROSCOPY  12   rt meniscus  . MEDIASTINOSCOPY N/A 10/30/2013    Procedure: MEDIASTINOSCOPY;  Surgeon: Melrose Nakayama, MD;  Location: [facility];  Service: Thoracic;  Laterality: N/A;  . ROTATOR CUFF REPAIR  2006   ? side  . THYROIDECTOMY  1973  . TRANSTHORACIC ECHOCARDIOGRAM  09/25/2012   EF 55-60%; mild LVH & mild concentric hypertrophy; mild AV regurg; RV systolic pressure increase consistent with mild pulm HTN  . VIDEO ASSISTED THORACOSCOPY (VATS)/ LOBECTOMY Right 11/13/2013   Procedure: VIDEO ASSISTED THORACOSCOPY (VATS) with mediastinal  biopsies;  Surgeon: Melrose Nakayama, MD;  Location: [facility];  Service: Thoracic;  Laterality: Right;  RIGHT VATS,mediastinal biopsies  . VIDEO BRONCHOSCOPY Bilateral 09/25/2013   Procedure: VIDEO BRONCHOSCOPY WITHOUT FLUORO;  Surgeon: Tanda Rockers, MD;  Location: [facility];  Service: Cardiopulmonary;  Laterality: Bilateral;  . VIDEO BRONCHOSCOPY WITH ENDOBRONCHIAL ULTRASOUND N/A 10/30/2013   Procedure: VIDEO BRONCHOSCOPY WITH  ENDOBRONCHIAL ULTRASOUND;  Surgeon: Melrose Nakayama, MD;  Location: [facility];  Service: Thoracic;  Laterality: N/A;    REVIEW OF SYSTEMS:  Constitutional: positive for fatigue Eyes: negative Ears, nose, mouth, throat, and face: negative Respiratory: positive for dyspnea on exertion Cardiovascular: negative Gastrointestinal: negative Genitourinary:negative Integument/breast: negative Hematologic/lymphatic: negative Musculoskeletal:negative Neurological: negative Behavioral/Psych: negative Endocrine: negative Allergic/Immunologic: negative   PHYSICAL EXAMINATION: General appearance: alert, cooperative, fatigued and no distress Head: Normocephalic, without obvious abnormality, atraumatic Neck: no adenopathy, no JVD, supple, symmetrical, trachea midline and thyroid not enlarged, symmetric, no tenderness/mass/nodules Lymph nodes: Cervical, supraclavicular, and axillary nodes normal. Resp: wheezes bilaterally and RUL Back: symmetric, no curvature. ROM normal. No CVA  tenderness. Cardio: regular rate and rhythm, S1, S2 normal, no murmur, click, rub or gallop GI: soft, non-tender; bowel sounds normal; no masses,  no organomegaly Extremities: extremities normal, atraumatic, no cyanosis or edema Neurologic: Alert and oriented X 3, normal strength and tone. Normal symmetric reflexes. Normal coordination and gait  ECOG PERFORMANCE STATUS: 1 - Symptomatic but completely ambulatory  Blood pressure 131/87, pulse 80, temperature 98.6 F (37 C), temperature source Oral, resp. rate 20, height _0  (1.473 m), weight 151 lb 1.6 oz (68.5 kg), SpO2 100 %.  LABORATORY DATA: Lab Results  Component Value Date   WBC 11.3 (H) 10/03/2018   HGB 10.3 (L) 10/03/2018   HCT 31.4 (L) 10/03/2018   MCV 94.3 10/03/2018   PLT 341 10/03/2018      Chemistry      Component Value Date/Time   NA 139 10/03/2018 0935   NA 140 07/12/2017 1212   K 3.8 10/03/2018 0935   K 3.5 07/12/2017 1212   CL 101 10/03/2018 0935   CO2 28 10/03/2018 0935   CO2 27 07/12/2017 1212   BUN 16 10/03/2018 0935   BUN 27.5 (H) 07/12/2017 1212   CREATININE 0.96 10/03/2018 0935   CREATININE 1.0 07/12/2017 1212      Component Value Date/Time   CALCIUM 9.5 10/03/2018 0935   CALCIUM 10.0 07/12/2017 1212   ALKPHOS 151 (H) 10/03/2018 0935   ALKPHOS 119 07/12/2017 1212   AST 10 (L) 10/03/2018 0935   AST 16 07/12/2017 1212   ALT 8 10/03/2018 0935   ALT 15 07/12/2017 1212   BILITOT 0.5 10/03/2018 0935   BILITOT 0.48 07/12/2017 1212       RADIOGRAPHIC STUDIES: Ct Chest W Contrast  Result Date: 09/30/2018 CLINICAL DATA:  Squamous cell carcinoma lung. Six cycles chemotherapy. EXAM: CT CHEST, ABDOMEN, AND PELVIS WITH CONTRAST TECHNIQUE: Multidetector CT imaging of the chest, abdomen and pelvis was performed following the standard protocol during bolus administration of intravenous contrast. CONTRAST:  68m OMNIPAQUE IOHEXOL 300 MG/ML  SOLN COMPARISON:  07/10/2018 FINDINGS: CT CHEST FINDINGS  Cardiovascular: Port in the anterior chest wall with tip in distal SVC. Mediastinum/Nodes: No axillary supraclavicular adenopathy. No mediastinal lymphadenopathy. Perihilar confluent thickening is similar prior. Lungs/Pleura: Within the RIGHT middle lobe, rounded lesion with peripheral enhancement is increased in size in the interval measuring 4.2 x 3.7 cm compared to 3.8 x 3.2 cm. Lesion has peripheral enhancement and central low attenuation suggesting necrosis. Perihilar consolidation with air bronchograms within the RIGHT lower lobe similar consistent radiation change. LEFT lung has peripheral subpleural reticulation suggesting pulmonary infection or inflammation (image 75/6). A similar findings in the RIGHT lower lobe (53/6). Musculoskeletal: No acute osseous abnormality. CT ABDOMEN PELVIS FINDINGS Hepatobiliary: No focal hepatic lesion. No biliary duct dilatation. Gallbladder is normal. Common bile duct is normal. Pancreas:  Pancreas is normal. No ductal dilatation. No pancreatic inflammation. Spleen: Normal spleen Adrenals/urinary tract: Adrenal glands normal. Small enhancing lesion the cortex of the LEFT kidney measures 13 mm unchanged 13 mm on prior but compared to more remote scan 2018 lesion is increased in size from 9 mm (image 56/2) Stomach/Bowel: Stomach, small bowel, appendix, and cecum are normal. The colon and rectosigmoid colon are normal. Vascular/Lymphatic: Abdominal aorta is normal caliber with atherosclerotic calcification. There is no retroperitoneal or periportal lymphadenopathy. No pelvic lymphadenopathy. Reproductive: Uterus and ovaries normal. Other: No free-fluid. No peritoneal metastasis lipoma in the RIGHT ear thigh. Musculoskeletal: No aggressive osseous lesion. IMPRESSION: Chest Impression: 1. Measurable increase in size of RIGHT middle lobe mass with peripheral enhancement. Findings concerning for lung cancer recurrence. Consider FDG PET scan for further evaluation. 2. Stable perihilar  post radiation change in the RIGHT lung. 3. New subpleural reticular pattern in the lower lobes suggest a pulmonary infection or inflammation including pneumonia and aspiration pneumonitis 4. No mediastinal or supraclavicular adenopathy. Abdomen / Pelvis Impression: 1. No evidence of metastatic disease in the abdomen pelvis. 2. Slowly enlarging small RIGHT renal lesion. Consider urology consultation at some point. 3. No skeletal metastasis. 4. 5. These results will be called to the ordering clinician or representative by the Radiologist Assistant, and communication documented in the PACS or zVision Dashboard. Electronically Signed   By: Suzy Bouchard M.D.   On: 09/30/2018 16:50   Ct Abdomen Pelvis W Contrast  Result Date: 09/30/2018 CLINICAL DATA:  Squamous cell carcinoma lung. Six cycles chemotherapy. EXAM: CT CHEST, ABDOMEN, AND PELVIS WITH CONTRAST TECHNIQUE: Multidetector CT imaging of the chest, abdomen and pelvis was performed following the standard protocol during bolus administration of intravenous contrast. CONTRAST:  47m OMNIPAQUE IOHEXOL 300 MG/ML  SOLN COMPARISON:  07/10/2018 FINDINGS: CT CHEST FINDINGS Cardiovascular: Port in the anterior chest wall with tip in distal SVC. Mediastinum/Nodes: No axillary supraclavicular adenopathy. No mediastinal lymphadenopathy. Perihilar confluent thickening is similar prior. Lungs/Pleura: Within the RIGHT middle lobe, rounded lesion with peripheral enhancement is increased in size in the interval measuring 4.2 x 3.7 cm compared to 3.8 x 3.2 cm. Lesion has peripheral enhancement and central low attenuation suggesting necrosis. Perihilar consolidation with air bronchograms within the RIGHT lower lobe similar consistent radiation change. LEFT lung has peripheral subpleural reticulation suggesting pulmonary infection or inflammation (image 75/6). A similar findings in the RIGHT lower lobe (53/6). Musculoskeletal: No acute osseous abnormality. CT ABDOMEN PELVIS  FINDINGS Hepatobiliary: No focal hepatic lesion. No biliary duct dilatation. Gallbladder is normal. Common bile duct is normal. Pancreas: Pancreas is normal. No ductal dilatation. No pancreatic inflammation. Spleen: Normal spleen Adrenals/urinary tract: Adrenal glands normal. Small enhancing lesion the cortex of the LEFT kidney measures 13 mm unchanged 13 mm on prior but compared to more remote scan 2018 lesion is increased in size from 9 mm (image 56/2) Stomach/Bowel: Stomach, small bowel, appendix, and cecum are normal. The colon and rectosigmoid colon are normal. Vascular/Lymphatic: Abdominal aorta is normal caliber with atherosclerotic calcification. There is no retroperitoneal or periportal lymphadenopathy. No pelvic lymphadenopathy. Reproductive: Uterus and ovaries normal. Other: No free-fluid. No peritoneal metastasis lipoma in the RIGHT ear thigh. Musculoskeletal: No aggressive osseous lesion. IMPRESSION: Chest Impression: 1. Measurable increase in size of RIGHT middle lobe mass with peripheral enhancement. Findings concerning for lung cancer recurrence. Consider FDG PET scan for further evaluation. 2. Stable perihilar post radiation change in the RIGHT lung. 3. New subpleural reticular pattern in the lower lobes suggest a pulmonary  infection or inflammation including pneumonia and aspiration pneumonitis 4. No mediastinal or supraclavicular adenopathy. Abdomen / Pelvis Impression: 1. No evidence of metastatic disease in the abdomen pelvis. 2. Slowly enlarging small RIGHT renal lesion. Consider urology consultation at some point. 3. No skeletal metastasis. 4. 5. These results will be called to the ordering clinician or representative by the Radiologist Assistant, and communication documented in the PACS or zVision Dashboard. Electronically Signed   By: Suzy Bouchard M.D.   On: 09/30/2018 16:50    ASSESSMENT AND PLAN: This is a very pleasant 78 years old white female with recurrent non-small cell lung  cancer, squamous cell carcinoma. The patient completed 6 cycles of systemic chemotherapy with carboplatin and paclitaxel and tolerated her treatment well except for fatigue as well as peripheral neuropathy. The patient has been in observation.  She continues to have persistent dry cough and shortness of breath. She had evidence for disease progression on restaging scan. The patient was started on treatment with second line immunotherapy with Nivolumab 480 mg IV every 4 weeks status post 9 cycles.  The patient continues to tolerate her treatment well with no concerning complaints.  She had repeat CT scan of the chest, abdomen and pelvis performed recently. I personally and independently reviewed the scan images and discussed the result and showed the images to the patient today.  The scan report indicated measurable increase in the size of the right middle lobe lung mass but on personal review of the scan in comparison to previous imaging studies I did not see any significant increase in the size of the tumor mass.  I had a lengthy discussion with the patient about her condition and treatment options.  The patient was given the option of continuing her current treatment with nivolumab every 4 weeks versus consideration of changing treatment to a different regimen with systemic chemotherapy.  She is interested in continuing her current treatment with immunotherapy for now.  We will continue to monitor the right middle lobe lung mass closely on upcoming scan and if it showed any significant progression, we will consider changing her treatment. She will come back for follow-up visit in 4 weeks for evaluation before starting cycle #11. She was advised to call immediately if she has any concerning symptoms in the interval. The patient voices understanding of current disease status and treatment options and is in agreement with the current care plan. All questions were answered. The patient knows to call the  clinic with any problems, questions or concerns. We can certainly see the patient much sooner if necessary.   Disclaimer: This note was dictated with voice recognition software. Similar sounding words can inadvertently be transcribed and may not be corrected upon review.

## 2018-10-03 NOTE — Patient Instructions (Signed)
[place] Discharge Instructions for Patients Receiving Chemotherapy  Today you received the following chemotherapy agents nivolumab (Opdivo).  To help prevent nausea and vomiting after your treatment, we encourage you to take your nausea medication as directed by your doctor.   If you develop nausea and vomiting that is not controlled by your nausea medication, call the clinic.   BELOW ARE SYMPTOMS THAT SHOULD BE REPORTED IMMEDIATELY:  *FEVER GREATER THAN 100.5 F  *CHILLS WITH OR WITHOUT FEVER  NAUSEA AND VOMITING THAT IS NOT CONTROLLED WITH YOUR NAUSEA MEDICATION  *UNUSUAL SHORTNESS OF BREATH  *UNUSUAL BRUISING OR BLEEDING  TENDERNESS IN MOUTH AND THROAT WITH OR WITHOUT PRESENCE OF ULCERS  *URINARY PROBLEMS  *BOWEL PROBLEMS  UNUSUAL RASH Items with * indicate a potential emergency and should be followed up as soon as possible.  Feel free to call the clinic should you have any questions or concerns. The clinic phone number is [phone].  Please show the [place] at check-in to the Emergency Department and triage nurse.

## 2018-10-04 ENCOUNTER — Telehealth: Payer: Self-pay | Admitting: Internal Medicine

## 2018-10-04 NOTE — Telephone Encounter (Signed)
Scheduled appt per 1/30 los - pt to get an updated schedule next visit.

## 2018-10-06 ENCOUNTER — Other Ambulatory Visit: Payer: Self-pay | Admitting: Pulmonary Disease

## 2018-10-07 NOTE — Telephone Encounter (Signed)
Waiting on BQ to return to office 10/08/18.

## 2018-10-08 NOTE — Telephone Encounter (Signed)
Kimberly [name] just returned to clinic today- this form is in his to-do folder, and this will be faxed once it has been signed by Kimberly [name].

## 2018-10-08 NOTE — Telephone Encounter (Signed)
Caryl Pina please advise once BQ has signed this and I can fax it over. Thank you.

## 2018-10-09 NOTE — Telephone Encounter (Signed)
Waiting on confirmation of signature.

## 2018-10-10 NOTE — Telephone Encounter (Signed)
Form has been signed by BQ and left up front for pickup, as it is missing a patient signature needed.  lmtcb X1 to make pt aware of form completion.

## 2018-10-14 NOTE — Telephone Encounter (Signed)
Called and spoke with Patient.  She is aware that form is missing a Patient signature, and they are at the front desk for pick up.  She stated that she was coming to pick up this week.  She stated that she would sign and mail it in.

## 2018-10-15 NOTE — Telephone Encounter (Signed)
Patient came by and signed form. Placed in Dr. Anastasia Pall box up front-pr

## 2018-10-17 NOTE — Telephone Encounter (Signed)
Kimberly [name], please advise if there is any update on this form, thank you!

## 2018-10-18 NOTE — Telephone Encounter (Signed)
Will await response from [facility]

## 2018-10-21 NOTE — Telephone Encounter (Signed)
Waiting for updates for [facility]

## 2018-10-22 NOTE — Telephone Encounter (Signed)
I have located the forms in BQ folder that have been signed by the patient. I have looked over forms and see that to the best of my knowledge everything is filled out. Called the toll free number [phone] to double check and see if form needs to be faxed in or mailed in. Per Murray Hodgkins from patient assistance foundation she stated that the form needed to be mailed in and not faxed in. Form has been placed up front in the out going mail. Will route this back to [place] to make her aware of me doing so.

## 2018-10-23 NOTE — Telephone Encounter (Signed)
Will await to see that Caryl Pina has seen this as an [place].

## 2018-10-24 ENCOUNTER — Other Ambulatory Visit: Payer: Medicare Other

## 2018-10-24 ENCOUNTER — Ambulatory Visit: Payer: Medicare Other

## 2018-10-24 ENCOUNTER — Ambulatory Visit: Payer: Medicare Other | Admitting: Internal Medicine

## 2018-10-31 ENCOUNTER — Telehealth: Payer: Self-pay | Admitting: Internal Medicine

## 2018-10-31 ENCOUNTER — Inpatient Hospital Stay: Payer: Medicare Other | Attending: Internal Medicine

## 2018-10-31 ENCOUNTER — Inpatient Hospital Stay: Payer: Medicare Other | Admitting: Internal Medicine

## 2018-10-31 ENCOUNTER — Inpatient Hospital Stay: Payer: Medicare Other

## 2018-10-31 ENCOUNTER — Encounter: Payer: Self-pay | Admitting: Internal Medicine

## 2018-10-31 VITALS — BP 130/79 | HR 80 | Temp 98.4°F | Resp 18 | Ht <= 58 in | Wt 150.5 lb

## 2018-10-31 DIAGNOSIS — C342 Malignant neoplasm of middle lobe, bronchus or lung: Secondary | ICD-10-CM | POA: Diagnosis not present

## 2018-10-31 DIAGNOSIS — Z5112 Encounter for antineoplastic immunotherapy: Secondary | ICD-10-CM | POA: Insufficient documentation

## 2018-10-31 DIAGNOSIS — M25511 Pain in right shoulder: Secondary | ICD-10-CM | POA: Diagnosis not present

## 2018-10-31 DIAGNOSIS — C3490 Malignant neoplasm of unspecified part of unspecified bronchus or lung: Secondary | ICD-10-CM

## 2018-10-31 DIAGNOSIS — R05 Cough: Secondary | ICD-10-CM

## 2018-10-31 DIAGNOSIS — Z79899 Other long term (current) drug therapy: Secondary | ICD-10-CM | POA: Diagnosis not present

## 2018-10-31 DIAGNOSIS — I10 Essential (primary) hypertension: Secondary | ICD-10-CM

## 2018-10-31 DIAGNOSIS — E860 Dehydration: Secondary | ICD-10-CM

## 2018-10-31 DIAGNOSIS — C3411 Malignant neoplasm of upper lobe, right bronchus or lung: Secondary | ICD-10-CM

## 2018-10-31 DIAGNOSIS — R0602 Shortness of breath: Secondary | ICD-10-CM

## 2018-10-31 LAB — CMP (CANCER CENTER ONLY)
ALT: 9 U/L (ref 0–44)
AST: 13 U/L — ABNORMAL LOW (ref 15–41)
Albumin: 3.3 g/dL — ABNORMAL LOW (ref 3.5–5.0)
Alkaline Phosphatase: 174 U/L — ABNORMAL HIGH (ref 38–126)
Anion gap: 12 (ref 5–15)
BUN: 23 mg/dL (ref 8–23)
CO2: 28 mmol/L (ref 22–32)
Calcium: 9.7 mg/dL (ref 8.9–10.3)
Chloride: 100 mmol/L (ref 98–111)
Creatinine: 1 mg/dL (ref 0.44–1.00)
GFR, EST NON AFRICAN AMERICAN: 55 mL/min — AB (ref >60)
GFR, Est AFR Am: >60 mL/min (ref >60)
Glucose, Bld: 116 mg/dL — ABNORMAL HIGH (ref 70–99)
Potassium: 3.6 mmol/L (ref 3.5–5.1)
Sodium: 140 mmol/L (ref 135–145)
Total Bilirubin: 0.5 mg/dL (ref 0.3–1.2)
Total Protein: 7.3 g/dL (ref 6.5–8.1)

## 2018-10-31 LAB — CBC WITH DIFFERENTIAL (CANCER CENTER ONLY)
Abs Immature Granulocytes: 0.07 10*3/uL (ref 0.00–0.07)
BASOS ABS: 0.1 10*3/uL (ref 0.0–0.1)
Basophils Relative: 1 %
Eosinophils Absolute: 0.7 10*3/uL — ABNORMAL HIGH (ref 0.0–0.5)
Eosinophils Relative: 8 %
HCT: 31.8 % — ABNORMAL LOW (ref 36.0–46.0)
Hemoglobin: 10.6 g/dL — ABNORMAL LOW (ref 12.0–15.0)
IMMATURE GRANULOCYTES: 1 %
Lymphocytes Relative: 15 %
Lymphs Abs: 1.4 10*3/uL (ref 0.7–4.0)
MCH: 30.9 pg (ref 26.0–34.0)
MCHC: 33.3 g/dL (ref 30.0–36.0)
MCV: 92.7 fL (ref 80.0–100.0)
Monocytes Absolute: 0.7 10*3/uL (ref 0.1–1.0)
Monocytes Relative: 7 %
NRBC: 0 % (ref 0.0–0.2)
Neutro Abs: 6.3 10*3/uL (ref 1.7–7.7)
Neutrophils Relative %: 68 %
Platelet Count: 338 10*3/uL (ref 150–400)
RBC: 3.43 MIL/uL — ABNORMAL LOW (ref 3.87–5.11)
RDW: 12.8 % (ref 11.5–15.5)
WBC: 9.2 10*3/uL (ref 4.0–10.5)

## 2018-10-31 LAB — TSH: TSH: 3.114 u[IU]/mL (ref 0.308–3.960)

## 2018-10-31 MED ORDER — SODIUM CHLORIDE 0.9 % IV SOLN
480.0000 mg | Freq: Once | INTRAVENOUS | Status: AC
  Administered 2018-10-31: 480 mg via INTRAVENOUS
  Filled 2018-10-31: qty 48

## 2018-10-31 MED ORDER — SODIUM CHLORIDE 0.9 % IV SOLN
Freq: Once | INTRAVENOUS | Status: AC
  Administered 2018-10-31: 11:00:00 via INTRAVENOUS
  Filled 2018-10-31: qty 250

## 2018-10-31 MED ORDER — HEPARIN SOD (PORK) LOCK FLUSH 100 UNIT/ML IV SOLN
500.0000 [IU] | Freq: Once | INTRAVENOUS | Status: AC | PRN
  Administered 2018-10-31: 500 [IU]
  Filled 2018-10-31: qty 5

## 2018-10-31 MED ORDER — SODIUM CHLORIDE 0.9% FLUSH
10.0000 mL | INTRAVENOUS | Status: DC | PRN
  Administered 2018-10-31: 10 mL
  Filled 2018-10-31: qty 10

## 2018-10-31 NOTE — Progress Notes (Signed)
[place] [phone]   Fax:([phone]) Kiel, MD [address] [place] [address]  DIAGNOSIS: Recurrent non-small cell lung cancer, squamous cell carcinoma initially diagnosed as unresectable a stage IIIa in February 2015.  PRIOR THERAPY: 1) Status post exploratory right VATS with mediastinal biopsy under the care of Dr. Roxan Hockey on 11/13/2013. The tumor was found to be unresectable at that time.[name] 2) Concurrent chemoradiation with weekly carboplatin for AUC of 2 and paclitaxel 45 mg/M2, status post 6 cycles with partial response. First cycle was given on 12/01/2013. 3) Consolidation chemotherapy with carboplatin for AUC of 5 and paclitaxel 175 mg/M2 every 3 weeks with Neulasta support. First dose 02/23/2014. Status post 3 cycles with partial response. 4) Systemic chemotherapy with carboplatin for AUC of 5 and paclitaxel 175 MG/M2 every 3 weeks with Neulasta support. Status post 6 cycles. First dose was given on 08/14/2016.  Last dose was giving 12/11/2016 with partial response.  CURRENT THERAPY: Second line treatment with immunotherapy with Nivolumab 480 mg IV every 4 weeks, first dose Jan 25, 2018.  Status post 10 cycles.  INTERVAL HISTORY: Kimberly [name] 78 y.o. female returns to the clinic today for follow-up visit.  The patient is feeling fine today with no concerning complaints except for right shoulder pain.  She denied having any chest pain, shortness of breath, cough or hemoptysis.  She denied having any fever or chills.  She has no nausea, vomiting, diarrhea or constipation.  She denied having any headache or visual changes.  She is here today for evaluation before starting cycle #11.  MEDICAL HISTORY: Past Medical History:  Diagnosis Date  . Allergic asthma without acute exacerbation or status asthmaticus   . Aortic insufficiency   . Arthritis   . Atrial fibrillation ([place])   . Back pain  08/14/2016  . Bronchitis   . Cough    dry, endobronchial mass  . Dyslipidemia   . Family history of adverse reaction to anesthesia    pts son also experiences N/V  . GERD (gastroesophageal reflux disease)   . Heart murmur   . History of blood transfusion   . History of bronchitis   . History of chemotherapy   . History of nuclear stress test 02/26/2006   exercise myoview; normal pattern of perfusion; low risk scan   . Hypertension   . Hypothyroidism   . Mitral insufficiency   . Pneumonia   . PONV (postoperative nausea and vomiting)   . Radiation 12/01/13-01/08/14   50.4 gray to right central chest  . Renal mass, left 04/12/2017  . Shortness of breath    with exertion  . Squamous cell carcinoma of lung ([place])   . Syncope    "states she has passed out a few times. dr is trying to find cause.last time when geeting ready to go home after video bronch/bx  . Thyroid disease     ALLERGIES:  is allergic to lactose intolerance (gi); amiodarone; augmentin [amoxicillin-pot clavulanate]; and milk-related compounds.  MEDICATIONS:  Current Outpatient Medications  Medication Sig Dispense Refill  . albuterol (PROVENTIL HFA;VENTOLIN HFA) 108 (90 BASE) MCG/ACT inhaler Inhale 1 puff into the lungs every 6 (six) hours as needed for wheezing or shortness of breath.     [name] amLODipine (NORVASC) 5 MG tablet Take 5 mg by mouth daily.  3  . atorvastatin (LIPITOR) 20 MG tablet Take 20 mg by mouth every morning.     [name]  chlorthalidone (HYGROTON) 25 MG tablet TAKE 1 TABLET BY MOUTH  DAILY 90 tablet 1  . fluticasone (FLONASE) 50 MCG/ACT nasal spray Place 2 sprays into both nostrils daily.    [name] KLOR-CON 8 MEQ tablet Take 8 mEq every other day by mouth.  3  . levothyroxine (SYNTHROID, LEVOTHROID) 100 MCG tablet Take 100 mcg daily by mouth.    . lidocaine-prilocaine (EMLA) cream Apply 1 application topically as needed. (Patient taking differently: Apply 1 application topically as needed (for port access). ) 30 g 0    . metoprolol succinate (TOPROL-XL) 25 MG 24 hr tablet Take 25 mg by mouth daily.    . Mometasone Furoate (ASMANEX HFA) 100 MCG/ACT AERO Inhale 2 puffs into the lungs 2 (two) times daily. 39 g 3  . montelukast (SINGULAIR) 10 MG tablet Take 10 mg by mouth at bedtime.     . Multiple Vitamins-Iron (MULTIVITAMIN/IRON PO) Take 1 tablet by mouth daily with breakfast.     . pantoprazole (PROTONIX) 40 MG tablet TAKE 1 TABLET BY MOUTH TWO  TIMES DAILY 120 tablet 0  . prochlorperazine (COMPAZINE) 10 MG tablet Take 1 tablet (10 mg total) by mouth every 6 (six) hours as needed for nausea or vomiting. (Patient not taking: Reported on 09/05/2018) 30 tablet 0  . traMADol (ULTRAM) 50 MG tablet Take 50 mg every 8 (eight) hours as needed by mouth. Take 1/2 tablet  3   No current facility-administered medications for this visit.     SURGICAL HISTORY:  Past Surgical History:  Procedure Laterality Date  . CARDIAC CATHETERIZATION  07/08/2008   normal coronaries  . CARPAL TUNNEL RELEASE Right 1988  . COLONOSCOPY N/A 02/11/2016   Procedure: COLONOSCOPY;  Surgeon: Carol Ada, MD;  Location: [facility];  Service: Endoscopy;  Laterality: N/A;  . DILATION AND CURETTAGE OF UTERUS    . ESOPHAGOGASTRODUODENOSCOPY N/A 02/08/2017   Procedure: ESOPHAGOGASTRODUODENOSCOPY (EGD);  Surgeon: Carol Ada, MD;  Location: [facility];  Service: Endoscopy;  Laterality: N/A;  . EYE SURGERY Bilateral   . H/O MET Test w/PFT  04/02/2012   low risk; peak VO2 77% predicted  . IR GENERIC HISTORICAL  09/12/2016   IR FLUORO GUIDE PORT INSERTION RIGHT 09/12/2016 [name], MD [facility]  . IR GENERIC HISTORICAL  09/12/2016   IR US GUIDE VASC ACCESS RIGHT 09/12/2016 [name], MD [facility]  . JOINT REPLACEMENT  2003   thumb rt  . KNEE ARTHROSCOPY  12   rt meniscus  . MEDIASTINOSCOPY N/A 10/30/2013   Procedure: MEDIASTINOSCOPY;  Surgeon: Melrose Nakayama, MD;  Location: [facility];  Service: Thoracic;  Laterality: N/A;  .  ROTATOR CUFF REPAIR  2006   ? side  . THYROIDECTOMY  1973  . TRANSTHORACIC ECHOCARDIOGRAM  09/25/2012   EF 55-60%; mild LVH & mild concentric hypertrophy; mild AV regurg; RV systolic pressure increase consistent with mild pulm HTN  . VIDEO ASSISTED THORACOSCOPY (VATS)/ LOBECTOMY Right 11/13/2013   Procedure: VIDEO ASSISTED THORACOSCOPY (VATS) with mediastinal  biopsies;  Surgeon: Melrose Nakayama, MD;  Location: [facility];  Service: Thoracic;  Laterality: Right;  RIGHT VATS,mediastinal biopsies  . VIDEO BRONCHOSCOPY Bilateral 09/25/2013   Procedure: VIDEO BRONCHOSCOPY WITHOUT FLUORO;  Surgeon: Tanda Rockers, MD;  Location: [facility];  Service: Cardiopulmonary;  Laterality: Bilateral;  . VIDEO BRONCHOSCOPY WITH ENDOBRONCHIAL ULTRASOUND N/A 10/30/2013   Procedure: VIDEO BRONCHOSCOPY WITH ENDOBRONCHIAL ULTRASOUND;  Surgeon: Melrose Nakayama, MD;  Location: [facility];  Service: Thoracic;  Laterality: N/A;  REVIEW OF SYSTEMS:  A comprehensive review of systems was negative except for: Musculoskeletal: positive for arthralgias   PHYSICAL EXAMINATION: General appearance: alert, cooperative, fatigued and no distress Head: Normocephalic, without obvious abnormality, atraumatic Neck: no adenopathy, no JVD, supple, symmetrical, trachea midline and thyroid not enlarged, symmetric, no tenderness/mass/nodules Lymph nodes: Cervical, supraclavicular, and axillary nodes normal. Resp: wheezes bilaterally Back: symmetric, no curvature. ROM normal. No CVA tenderness. Cardio: regular rate and rhythm, S1, S2 normal, no murmur, click, rub or gallop GI: soft, non-tender; bowel sounds normal; no masses,  no organomegaly Extremities: extremities normal, atraumatic, no cyanosis or edema  ECOG PERFORMANCE STATUS: 1 - Symptomatic but completely ambulatory  Blood pressure 130/79, pulse 80, temperature 98.4 F (36.9 C), temperature source Oral, resp. rate 18, height '4\' 10"'  (1.473 m), weight 150 lb 8 oz (68.3 kg),  SpO2 98 %.  LABORATORY DATA: Lab Results  Component Value Date   WBC 9.2 10/31/2018   HGB 10.6 (L) 10/31/2018   HCT 31.8 (L) 10/31/2018   MCV 92.7 10/31/2018   PLT 338 10/31/2018      Chemistry      Component Value Date/Time   NA 139 10/03/2018 0935   NA 140 07/12/2017 1212   K 3.8 10/03/2018 0935   K 3.5 07/12/2017 1212   CL 101 10/03/2018 0935   CO2 28 10/03/2018 0935   CO2 27 07/12/2017 1212   BUN 16 10/03/2018 0935   BUN 27.5 (H) 07/12/2017 1212   CREATININE 0.96 10/03/2018 0935   CREATININE 1.0 07/12/2017 1212      Component Value Date/Time   CALCIUM 9.5 10/03/2018 0935   CALCIUM 10.0 07/12/2017 1212   ALKPHOS 151 (H) 10/03/2018 0935   ALKPHOS 119 07/12/2017 1212   AST 10 (L) 10/03/2018 0935   AST 16 07/12/2017 1212   ALT 8 10/03/2018 0935   ALT 15 07/12/2017 1212   BILITOT 0.5 10/03/2018 0935   BILITOT 0.48 07/12/2017 1212       RADIOGRAPHIC STUDIES: No results found.  ASSESSMENT AND PLAN: This is a very pleasant 78 years old white female with recurrent non-small cell lung cancer, squamous cell carcinoma. The patient completed 6 cycles of systemic chemotherapy with carboplatin and paclitaxel and tolerated her treatment well except for fatigue as well as peripheral neuropathy. The patient has been in observation.  She continues to have persistent dry cough and shortness of breath. She had evidence for disease progression on restaging scan. The patient was started on treatment with second line immunotherapy with Nivolumab 480 mg IV every 4 weeks status post 10 cycles.  The patient continues to tolerate this treatment well with no concerning adverse effects. I recommended for her to proceed with cycle #11 today as scheduled. I will see her back for follow-up visit in 4 weeks for evaluation before the next cycle of her treatment. The patient was advised to call immediately if she has any concerning symptoms in the interval. The patient voices understanding of  current disease status and treatment options and is in agreement with the current care plan. All questions were answered. The patient knows to call the clinic with any problems, questions or concerns. We can certainly see the patient much sooner if necessary.   Disclaimer: This note was dictated with voice recognition software. Similar sounding words can inadvertently be transcribed and may not be corrected upon review.

## 2018-10-31 NOTE — Patient Instructions (Signed)
[facility] Discharge Instructions for Patients Receiving Chemotherapy  Today you received the following chemotherapy agents nivolumab (Opdivo).  To help prevent nausea and vomiting after your treatment, we encourage you to take your nausea medication as directed by your doctor.   If you develop nausea and vomiting that is not controlled by your nausea medication, call the clinic.   BELOW ARE SYMPTOMS THAT SHOULD BE REPORTED IMMEDIATELY:  *FEVER GREATER THAN 100.5 F  *CHILLS WITH OR WITHOUT FEVER  NAUSEA AND VOMITING THAT IS NOT CONTROLLED WITH YOUR NAUSEA MEDICATION  *UNUSUAL SHORTNESS OF BREATH  *UNUSUAL BRUISING OR BLEEDING  TENDERNESS IN MOUTH AND THROAT WITH OR WITHOUT PRESENCE OF ULCERS  *URINARY PROBLEMS  *BOWEL PROBLEMS  UNUSUAL RASH Items with * indicate a potential emergency and should be followed up as soon as possible.  Feel free to call the clinic should you have any questions or concerns. The clinic phone number is [phone].  Please show the [place] at check-in to the Emergency Department and triage nurse.

## 2018-10-31 NOTE — Telephone Encounter (Signed)
Scheduled appt per 2/27 los - added another cycle - pt to get an updated schedule next visit.

## 2018-11-07 DIAGNOSIS — M25511 Pain in right shoulder: Secondary | ICD-10-CM | POA: Diagnosis not present

## 2018-11-15 MED ORDER — LIDOCAINE HCL 1 % IJ SOLN
INTRAMUSCULAR | Status: AC
  Filled 2018-11-15: qty 20

## 2018-11-18 DIAGNOSIS — M25511 Pain in right shoulder: Secondary | ICD-10-CM | POA: Diagnosis not present

## 2018-11-25 ENCOUNTER — Other Ambulatory Visit: Payer: Self-pay

## 2018-11-25 NOTE — Patient Outreach (Signed)
[facility]) Care Management  11/25/2018   Kimberly [name] Aug 24, 1941 [id number]  Subjective: Successful outreach to the patient.  HIPAA verified.  The patient states that she is doing well.  She denies any falls.  She does have pain in her right shoulder that she rates at about an 7/10.  She states that she has gone to see the orthopedist and was told she has bursitis and arthritis. She was given exercises to do for the pain.  She states that her blood pressures have been ranging 120-130's over 80.  She has been very proud of that.  She is taking her medications as prescribed. She states that she is not going out but to walk in her complex.  She is monitoring her diet.  Encouraged the patient to keep up her efforts..  Advised patient to avoid people who are sick, social distancing and washing hands with soap and water or using hand sanitizer. She verbalized understanding.   Current Medications:  Current Outpatient Medications  Medication Sig Dispense Refill  . albuterol (PROVENTIL HFA;VENTOLIN HFA) 108 (90 BASE) MCG/ACT inhaler Inhale 1 puff into the lungs every 6 (six) hours as needed for wheezing or shortness of breath.     [name] amLODipine (NORVASC) 5 MG tablet Take 5 mg by mouth daily.  3  . atorvastatin (LIPITOR) 20 MG tablet Take 20 mg by mouth every morning.     . chlorthalidone (HYGROTON) 25 MG tablet TAKE 1 TABLET BY MOUTH  DAILY 90 tablet 1  . fluticasone (FLONASE) 50 MCG/ACT nasal spray Place 2 sprays into both nostrils daily.    [name] KLOR-CON 8 MEQ tablet Take 8 mEq every other day by mouth.  3  . levothyroxine (SYNTHROID, LEVOTHROID) 100 MCG tablet Take 100 mcg daily by mouth.    . lidocaine-prilocaine (EMLA) cream Apply 1 application topically as needed. (Patient taking differently: Apply 1 application topically as needed (for port access). ) 30 g 0  . metoprolol succinate (TOPROL-XL) 25 MG 24 hr tablet Take 25 mg by mouth daily.    . Mometasone Furoate (ASMANEX HFA)  100 MCG/ACT AERO Inhale 2 puffs into the lungs 2 (two) times daily. 39 g 3  . montelukast (SINGULAIR) 10 MG tablet Take 10 mg by mouth at bedtime.     . Multiple Vitamins-Iron (MULTIVITAMIN/IRON PO) Take 1 tablet by mouth daily with breakfast.     . pantoprazole (PROTONIX) 40 MG tablet TAKE 1 TABLET BY MOUTH TWO  TIMES DAILY 120 tablet 0  . prochlorperazine (COMPAZINE) 10 MG tablet Take 1 tablet (10 mg total) by mouth every 6 (six) hours as needed for nausea or vomiting. 30 tablet 0  . traMADol (ULTRAM) 50 MG tablet Take 50 mg every 8 (eight) hours as needed by mouth. Take 1/2 tablet  3   No current facility-administered medications for this visit.     Functional Status:  In your present state of health, do you have any difficulty performing the following activities: 03/28/2018  Hearing? N  Vision? N  Difficulty concentrating or making decisions? N  Walking or climbing stairs? Y  Comment repiratory issues  Dressing or bathing? N  Doing errands, shopping? N  Preparing Food and eating ? N  Using the Toilet? N  In the past six months, have you accidently leaked urine? N  Do you have problems with loss of bowel control? N  Managing your Medications? N  Managing your Finances? N  Housekeeping or managing your Housekeeping? N  Some  recent data might be hidden    Fall/Depression Screening: Fall Risk  11/25/2018 09/26/2018 07/24/2018  Falls in the past year? 0 0 0  Number falls in past yr: - - -  Comment - - -  Injury with Fall? - - -  Risk Factor Category  - - -  Risk for fall due to : - - -  Risk for fall due to: Comment - - -   PHQ 2/9 Scores 03/28/2018  PHQ - 2 Score 1    Assessment: Patient will continue to benefit from health coach outreach for disease management and support. [facility] CM Care Plan Problem One     Most Recent Value  [facility] Long Term Goal   In 90 days the patient will verbalize that her blood pressure remians in the 120-130's over 80  [facility] Long Term Goal Start Date   11/25/18  Interventions for Problem One Long Term Goal  Encouraged the patient to continue her healthy eating habits, monitoring her blood pressure, continue medication adherence andAdvised patient to avoid people who are sick, social distancin, washing hands with soap and water or using hand sanitizer.       Plan: RN Health Coach will contact patient in the month of June and patient agrees to next outreach.   [name] RN, BSN, [place] Direct Dial:  [phone]  Fax: [phone]

## 2018-11-27 ENCOUNTER — Other Ambulatory Visit: Payer: Self-pay | Admitting: Medical Oncology

## 2018-11-27 DIAGNOSIS — C342 Malignant neoplasm of middle lobe, bronchus or lung: Secondary | ICD-10-CM

## 2018-11-27 DIAGNOSIS — R5383 Other fatigue: Secondary | ICD-10-CM

## 2018-11-28 ENCOUNTER — Inpatient Hospital Stay: Payer: Medicare Other

## 2018-11-28 ENCOUNTER — Inpatient Hospital Stay: Payer: Medicare Other | Attending: Internal Medicine

## 2018-11-28 ENCOUNTER — Telehealth: Payer: Self-pay | Admitting: Internal Medicine

## 2018-11-28 ENCOUNTER — Other Ambulatory Visit: Payer: Self-pay

## 2018-11-28 ENCOUNTER — Encounter: Payer: Self-pay | Admitting: Internal Medicine

## 2018-11-28 ENCOUNTER — Inpatient Hospital Stay (HOSPITAL_BASED_OUTPATIENT_CLINIC_OR_DEPARTMENT_OTHER): Payer: Medicare Other | Admitting: Internal Medicine

## 2018-11-28 VITALS — BP 136/63 | HR 77 | Temp 98.3°F | Resp 18 | Ht <= 58 in | Wt 149.4 lb

## 2018-11-28 DIAGNOSIS — R0602 Shortness of breath: Secondary | ICD-10-CM

## 2018-11-28 DIAGNOSIS — Z5112 Encounter for antineoplastic immunotherapy: Secondary | ICD-10-CM | POA: Diagnosis not present

## 2018-11-28 DIAGNOSIS — C342 Malignant neoplasm of middle lobe, bronchus or lung: Secondary | ICD-10-CM

## 2018-11-28 DIAGNOSIS — Z79899 Other long term (current) drug therapy: Secondary | ICD-10-CM | POA: Insufficient documentation

## 2018-11-28 DIAGNOSIS — R05 Cough: Secondary | ICD-10-CM

## 2018-11-28 DIAGNOSIS — I10 Essential (primary) hypertension: Secondary | ICD-10-CM

## 2018-11-28 DIAGNOSIS — R5383 Other fatigue: Secondary | ICD-10-CM

## 2018-11-28 DIAGNOSIS — E860 Dehydration: Secondary | ICD-10-CM

## 2018-11-28 DIAGNOSIS — C3490 Malignant neoplasm of unspecified part of unspecified bronchus or lung: Secondary | ICD-10-CM

## 2018-11-28 LAB — CMP (CANCER CENTER ONLY)
ALT: 9 U/L (ref 0–44)
AST: 12 U/L — ABNORMAL LOW (ref 15–41)
Albumin: 3.2 g/dL — ABNORMAL LOW (ref 3.5–5.0)
Alkaline Phosphatase: 160 U/L — ABNORMAL HIGH (ref 38–126)
Anion gap: 10 (ref 5–15)
BILIRUBIN TOTAL: 0.5 mg/dL (ref 0.3–1.2)
BUN: 16 mg/dL (ref 8–23)
CO2: 29 mmol/L (ref 22–32)
Calcium: 9.4 mg/dL (ref 8.9–10.3)
Chloride: 99 mmol/L (ref 98–111)
Creatinine: 0.9 mg/dL (ref 0.44–1.00)
GFR, Est AFR Am: >60 mL/min (ref >60)
GFR, Estimated: >60 mL/min (ref >60)
Glucose, Bld: 105 mg/dL — ABNORMAL HIGH (ref 70–99)
Potassium: 3.5 mmol/L (ref 3.5–5.1)
Sodium: 138 mmol/L (ref 135–145)
Total Protein: 7.1 g/dL (ref 6.5–8.1)

## 2018-11-28 LAB — CBC WITH DIFFERENTIAL (CANCER CENTER ONLY)
Abs Immature Granulocytes: 0.04 10*3/uL (ref 0.00–0.07)
Basophils Absolute: 0.1 10*3/uL (ref 0.0–0.1)
Basophils Relative: 1 %
EOS PCT: 4 %
Eosinophils Absolute: 0.4 10*3/uL (ref 0.0–0.5)
HCT: 33.7 % — ABNORMAL LOW (ref 36.0–46.0)
Hemoglobin: 11 g/dL — ABNORMAL LOW (ref 12.0–15.0)
IMMATURE GRANULOCYTES: 1 %
Lymphocytes Relative: 16 %
Lymphs Abs: 1.4 10*3/uL (ref 0.7–4.0)
MCH: 30.9 pg (ref 26.0–34.0)
MCHC: 32.6 g/dL (ref 30.0–36.0)
MCV: 94.7 fL (ref 80.0–100.0)
MONO ABS: 0.7 10*3/uL (ref 0.1–1.0)
Monocytes Relative: 8 %
Neutro Abs: 6.1 10*3/uL (ref 1.7–7.7)
Neutrophils Relative %: 70 %
Platelet Count: 314 10*3/uL (ref 150–400)
RBC: 3.56 MIL/uL — ABNORMAL LOW (ref 3.87–5.11)
RDW: 12.9 % (ref 11.5–15.5)
WBC Count: 8.7 10*3/uL (ref 4.0–10.5)
nRBC: 0 % (ref 0.0–0.2)

## 2018-11-28 LAB — TSH: TSH: 2.114 u[IU]/mL (ref 0.308–3.960)

## 2018-11-28 MED ORDER — SODIUM CHLORIDE 0.9 % IV SOLN
Freq: Once | INTRAVENOUS | Status: AC
  Administered 2018-11-28: 11:00:00 via INTRAVENOUS
  Filled 2018-11-28: qty 250

## 2018-11-28 MED ORDER — HEPARIN SOD (PORK) LOCK FLUSH 100 UNIT/ML IV SOLN
500.0000 [IU] | Freq: Once | INTRAVENOUS | Status: AC | PRN
  Administered 2018-11-28: 500 [IU]
  Filled 2018-11-28: qty 5

## 2018-11-28 MED ORDER — SODIUM CHLORIDE 0.9% FLUSH
10.0000 mL | INTRAVENOUS | Status: DC | PRN
  Administered 2018-11-28: 10 mL
  Filled 2018-11-28: qty 10

## 2018-11-28 MED ORDER — SODIUM CHLORIDE 0.9 % IV SOLN
480.0000 mg | Freq: Once | INTRAVENOUS | Status: AC
  Administered 2018-11-28: 480 mg via INTRAVENOUS
  Filled 2018-11-28: qty 48

## 2018-11-28 NOTE — Patient Instructions (Signed)
[facility] Discharge Instructions for Patients Receiving Chemotherapy  Today you received the following chemotherapy agent: nivolumab (Opdivo).  To help prevent nausea and vomiting after your treatment, we encourage you to take your nausea medication as directed by your doctor.   If you develop nausea and vomiting that is not controlled by your nausea medication, call the clinic.   BELOW ARE SYMPTOMS THAT SHOULD BE REPORTED IMMEDIATELY:  *FEVER GREATER THAN 100.5 F  *CHILLS WITH OR WITHOUT FEVER  NAUSEA AND VOMITING THAT IS NOT CONTROLLED WITH YOUR NAUSEA MEDICATION  *UNUSUAL SHORTNESS OF BREATH  *UNUSUAL BRUISING OR BLEEDING  TENDERNESS IN MOUTH AND THROAT WITH OR WITHOUT PRESENCE OF ULCERS  *URINARY PROBLEMS  *BOWEL PROBLEMS  UNUSUAL RASH Items with * indicate a potential emergency and should be followed up as soon as possible.  Feel free to call the clinic should you have any questions or concerns. The clinic phone number is [phone].  Please show the [place] at check-in to the Emergency Department and triage nurse.

## 2018-11-28 NOTE — Telephone Encounter (Signed)
Gave patient contrast and the number to central radiology.

## 2018-11-28 NOTE — Progress Notes (Signed)
[place] [phone]   Fax:([phone] [place], [place] [address] [place] [address]  DIAGNOSIS: Recurrent non-small cell lung cancer, squamous cell carcinoma initially diagnosed as unresectable a stage IIIa in February 2015.  PRIOR THERAPY: 1) Status post exploratory right VATS with mediastinal biopsy under the care of Dr. Roxan Hockey on 11/13/2013. The tumor was found to be unresectable at that time.[name] 2) Concurrent chemoradiation with weekly carboplatin for AUC of 2 and paclitaxel 45 mg/M2, status post 6 cycles with partial response. First cycle was given on 12/01/2013. 3) Consolidation chemotherapy with carboplatin for AUC of 5 and paclitaxel 175 mg/M2 every 3 weeks with Neulasta support. First dose 02/23/2014. Status post 3 cycles with partial response. 4) Systemic chemotherapy with carboplatin for AUC of 5 and paclitaxel 175 MG/M2 every 3 weeks with Neulasta support. Status post 6 cycles. First dose was given on 08/14/2016.  Last dose was giving 12/11/2016 with partial response.  CURRENT THERAPY: Second line treatment with immunotherapy with Nivolumab 480 mg IV every 4 weeks, first dose Jan 25, 2018.  Status post 11 cycles.  INTERVAL HISTORY: Kimberly [name] 78 y.o. female returns to the clinic today for follow-up visit.  The patient is feeling fine today with no concerning complaints.  She denied having any chest pain, shortness of breath, cough or hemoptysis.  She denied having any fever or chills.  She has no nausea, vomiting, diarrhea or constipation.  She denied having any headache or visual changes.  She was diagnosed with bursitis of the right shoulder and she received a steroid injection with some relief.  The patient is here today for evaluation before starting cycle #12.  MEDICAL HISTORY: Past Medical History:  Diagnosis Date  . Allergic asthma without acute exacerbation or status asthmaticus    . Aortic insufficiency   . Arthritis   . Atrial fibrillation ([place])   . Back pain 08/14/2016  . Bronchitis   . Cough    dry, endobronchial mass  . Dyslipidemia   . Family history of adverse reaction to anesthesia    pts son also experiences N/V  . GERD (gastroesophageal reflux disease)   . Heart murmur   . History of blood transfusion   . History of bronchitis   . History of chemotherapy   . History of nuclear stress test 02/26/2006   exercise myoview; normal pattern of perfusion; low risk scan   . Hypertension   . Hypothyroidism   . Mitral insufficiency   . Pneumonia   . PONV (postoperative nausea and vomiting)   . Radiation 12/01/13-01/08/14   50.4 gray to right central chest  . Renal mass, left 04/12/2017  . Shortness of breath    with exertion  . Squamous cell carcinoma of lung ([place])   . Syncope    "states she has passed out a few times. dr is trying to find cause.last time when geeting ready to go home after video bronch/bx  . Thyroid disease     ALLERGIES:  is allergic to lactose intolerance (gi); amiodarone; augmentin [amoxicillin-pot clavulanate]; and milk-related compounds.  MEDICATIONS:  Current Outpatient Medications  Medication Sig Dispense Refill  . albuterol (PROVENTIL HFA;VENTOLIN HFA) 108 (90 BASE) MCG/ACT inhaler Inhale 1 puff into the lungs every 6 (six) hours as needed for wheezing or shortness of breath.     [name] amLODipine (NORVASC) 5 MG tablet Take 5 mg by mouth daily.  3  . atorvastatin (  LIPITOR) 20 MG tablet Take 20 mg by mouth every morning.     . chlorthalidone (HYGROTON) 25 MG tablet TAKE 1 TABLET BY MOUTH  DAILY 90 tablet 1  . fluticasone (FLONASE) 50 MCG/ACT nasal spray Place 2 sprays into both nostrils daily.    [name] KLOR-CON 8 MEQ tablet Take 8 mEq every other day by mouth.  3  . levothyroxine (SYNTHROID, LEVOTHROID) 100 MCG tablet Take 100 mcg daily by mouth.    . lidocaine-prilocaine (EMLA) cream Apply 1 application topically as needed. (Patient  taking differently: Apply 1 application topically as needed (for port access). ) 30 g 0  . metoprolol succinate (TOPROL-XL) 25 MG 24 hr tablet Take 25 mg by mouth daily.    . Mometasone Furoate (ASMANEX HFA) 100 MCG/ACT AERO Inhale 2 puffs into the lungs 2 (two) times daily. 39 g 3  . montelukast (SINGULAIR) 10 MG tablet Take 10 mg by mouth at bedtime.     . Multiple Vitamins-Iron (MULTIVITAMIN/IRON PO) Take 1 tablet by mouth daily with breakfast.     . pantoprazole (PROTONIX) 40 MG tablet TAKE 1 TABLET BY MOUTH TWO  TIMES DAILY 120 tablet 0  . prochlorperazine (COMPAZINE) 10 MG tablet Take 1 tablet (10 mg total) by mouth every 6 (six) hours as needed for nausea or vomiting. 30 tablet 0  . traMADol (ULTRAM) 50 MG tablet Take 50 mg every 8 (eight) hours as needed by mouth. Take 1/2 tablet  3   No current facility-administered medications for this visit.     SURGICAL HISTORY:  Past Surgical History:  Procedure Laterality Date  . CARDIAC CATHETERIZATION  07/08/2008   normal coronaries  . CARPAL TUNNEL RELEASE Right 1988  . COLONOSCOPY N/A 02/11/2016   Procedure: COLONOSCOPY;  Surgeon: Carol Ada, MD;  Location: [facility];  Service: Endoscopy;  Laterality: N/A;  . DILATION AND CURETTAGE OF UTERUS    . ESOPHAGOGASTRODUODENOSCOPY N/A 02/08/2017   Procedure: ESOPHAGOGASTRODUODENOSCOPY (EGD);  Surgeon: Carol Ada, MD;  Location: [facility];  Service: Endoscopy;  Laterality: N/A;  . EYE SURGERY Bilateral   . H/O MET Test w/PFT  04/02/2012   low risk; peak VO2 77% predicted  . IR GENERIC HISTORICAL  09/12/2016   IR FLUORO GUIDE PORT INSERTION RIGHT 09/12/2016 [name], MD [facility]  . IR GENERIC HISTORICAL  09/12/2016   IR US GUIDE VASC ACCESS RIGHT 09/12/2016 [name], MD [facility]  . JOINT REPLACEMENT  2003   thumb rt  . KNEE ARTHROSCOPY  12   rt meniscus  . MEDIASTINOSCOPY N/A 10/30/2013   Procedure: MEDIASTINOSCOPY;  Surgeon: Melrose Nakayama, MD;  Location: [facility];   Service: Thoracic;  Laterality: N/A;  . ROTATOR CUFF REPAIR  2006   ? side  . THYROIDECTOMY  1973  . TRANSTHORACIC ECHOCARDIOGRAM  09/25/2012   EF 55-60%; mild LVH & mild concentric hypertrophy; mild AV regurg; RV systolic pressure increase consistent with mild pulm HTN  . VIDEO ASSISTED THORACOSCOPY (VATS)/ LOBECTOMY Right 11/13/2013   Procedure: VIDEO ASSISTED THORACOSCOPY (VATS) with mediastinal  biopsies;  Surgeon: Melrose Nakayama, MD;  Location: [facility];  Service: Thoracic;  Laterality: Right;  RIGHT VATS,mediastinal biopsies  . VIDEO BRONCHOSCOPY Bilateral 09/25/2013   Procedure: VIDEO BRONCHOSCOPY WITHOUT FLUORO;  Surgeon: Tanda Rockers, MD;  Location: [facility];  Service: Cardiopulmonary;  Laterality: Bilateral;  . VIDEO BRONCHOSCOPY WITH ENDOBRONCHIAL ULTRASOUND N/A 10/30/2013   Procedure: VIDEO BRONCHOSCOPY WITH ENDOBRONCHIAL ULTRASOUND;  Surgeon: Melrose Nakayama, MD;  Location: [facility];  Service: Thoracic;  Laterality: N/A;    REVIEW OF SYSTEMS:  A comprehensive review of systems was negative except for: Musculoskeletal: positive for arthralgias   PHYSICAL EXAMINATION: General appearance: alert, cooperative and no distress Head: Normocephalic, without obvious abnormality, atraumatic Neck: no adenopathy, no JVD, supple, symmetrical, trachea midline and thyroid not enlarged, symmetric, no tenderness/mass/nodules Lymph nodes: Cervical, supraclavicular, and axillary nodes normal. Resp: clear to auscultation bilaterally Back: symmetric, no curvature. ROM normal. No CVA tenderness. Cardio: regular rate and rhythm, S1, S2 normal, no murmur, click, rub or gallop GI: soft, non-tender; bowel sounds normal; no masses,  no organomegaly Extremities: extremities normal, atraumatic, no cyanosis or edema  ECOG PERFORMANCE STATUS: 1 - Symptomatic but completely ambulatory  Blood pressure 136/63, pulse 77, temperature 98.3 F (36.8 C), temperature source Oral, resp. rate 18, height 4'  10" (1.473 m), weight 149 lb 6.4 oz (67.8 kg), SpO2 98 %.  LABORATORY DATA: Lab Results  Component Value Date   WBC 8.7 11/28/2018   HGB 11.0 (L) 11/28/2018   HCT 33.7 (L) 11/28/2018   MCV 94.7 11/28/2018   PLT 314 11/28/2018      Chemistry      Component Value Date/Time   NA 138 11/28/2018 0923   NA 140 07/12/2017 1212   K 3.5 11/28/2018 0923   K 3.5 07/12/2017 1212   CL 99 11/28/2018 0923   CO2 29 11/28/2018 0923   CO2 27 07/12/2017 1212   BUN 16 11/28/2018 0923   BUN 27.5 (H) 07/12/2017 1212   CREATININE 0.90 11/28/2018 0923   CREATININE 1.0 07/12/2017 1212      Component Value Date/Time   CALCIUM 9.4 11/28/2018 0923   CALCIUM 10.0 07/12/2017 1212   ALKPHOS 160 (H) 11/28/2018 0923   ALKPHOS 119 07/12/2017 1212   AST 12 (L) 11/28/2018 0923   AST 16 07/12/2017 1212   ALT 9 11/28/2018 0923   ALT 15 07/12/2017 1212   BILITOT 0.5 11/28/2018 0923   BILITOT 0.48 07/12/2017 1212       RADIOGRAPHIC STUDIES: No results found.  ASSESSMENT AND PLAN: This is a very pleasant 78 years old white female with recurrent non-small cell lung cancer, squamous cell carcinoma. The patient completed 6 cycles of systemic chemotherapy with carboplatin and paclitaxel and tolerated her treatment well except for fatigue as well as peripheral neuropathy. The patient has been in observation.  She continues to have persistent dry cough and shortness of breath. She had evidence for disease progression on restaging scan. The patient was started on treatment with second line immunotherapy with Nivolumab 480 mg IV every 4 weeks status post 11 cycles.  The patient continues to tolerate this treatment well with no concerning adverse effects. I recommended for her to continue her current treatment with nivolumab and she will proceed with cycle #12 today. I will see her back for follow-up visit in 4 weeks for evaluation after repeating CT scan of the chest, abdomen and pelvis for restaging of her  disease. She was advised to call immediately if she has any concerning symptoms in the interval. The patient voices understanding of current disease status and treatment options and is in agreement with the current care plan. All questions were answered. The patient knows to call the clinic with any problems, questions or concerns. We can certainly see the patient much sooner if necessary.   Disclaimer: This note was dictated with voice recognition software. Similar sounding words can inadvertently be transcribed and may not be corrected upon review.

## 2018-12-15 ENCOUNTER — Other Ambulatory Visit: Payer: Self-pay | Admitting: Pulmonary Disease

## 2018-12-19 ENCOUNTER — Other Ambulatory Visit: Payer: Self-pay

## 2018-12-19 ENCOUNTER — Ambulatory Visit (HOSPITAL_COMMUNITY)
Admission: RE | Admit: 2018-12-19 | Discharge: 2018-12-19 | Disposition: A | Discharge status: Home / Self Care | Payer: Medicare Other | Source: Ambulatory Visit | Attending: Internal Medicine | Admitting: Internal Medicine

## 2018-12-19 DIAGNOSIS — C3491 Malignant neoplasm of unspecified part of right bronchus or lung: Secondary | ICD-10-CM | POA: Diagnosis not present

## 2018-12-19 DIAGNOSIS — C3490 Malignant neoplasm of unspecified part of unspecified bronchus or lung: Secondary | ICD-10-CM | POA: Diagnosis not present

## 2018-12-19 MED ORDER — HEPARIN SOD (PORK) LOCK FLUSH 100 UNIT/ML IV SOLN
500.0000 [IU] | Freq: Once | INTRAVENOUS | Status: AC
  Administered 2018-12-19: 15:00:00 500 [IU] via INTRAVENOUS

## 2018-12-19 MED ORDER — IOHEXOL 300 MG/ML  SOLN
100.0000 mL | Freq: Once | INTRAMUSCULAR | Status: AC | PRN
  Administered 2018-12-19: 100 mL via INTRAVENOUS

## 2018-12-19 MED ORDER — SODIUM CHLORIDE (PF) 0.9 % IJ SOLN
INTRAMUSCULAR | Status: AC
  Filled 2018-12-19: qty 50

## 2018-12-19 MED ORDER — HEPARIN SOD (PORK) LOCK FLUSH 100 UNIT/ML IV SOLN
INTRAVENOUS | Status: AC
  Filled 2018-12-19: qty 5

## 2018-12-23 ENCOUNTER — Ambulatory Visit: Payer: Medicare Other | Admitting: Pulmonary Disease

## 2018-12-24 ENCOUNTER — Ambulatory Visit (HOSPITAL_COMMUNITY): Payer: Medicare Other

## 2018-12-26 ENCOUNTER — Other Ambulatory Visit: Payer: Self-pay

## 2018-12-26 ENCOUNTER — Inpatient Hospital Stay: Payer: Medicare Other

## 2018-12-26 ENCOUNTER — Inpatient Hospital Stay (HOSPITAL_BASED_OUTPATIENT_CLINIC_OR_DEPARTMENT_OTHER): Payer: Medicare Other | Admitting: Internal Medicine

## 2018-12-26 ENCOUNTER — Inpatient Hospital Stay: Payer: Medicare Other | Attending: Internal Medicine

## 2018-12-26 ENCOUNTER — Encounter: Payer: Self-pay | Admitting: Internal Medicine

## 2018-12-26 VITALS — BP 137/74 | HR 81 | Temp 98.1°F | Resp 18 | Ht <= 58 in | Wt 147.7 lb

## 2018-12-26 DIAGNOSIS — R630 Anorexia: Secondary | ICD-10-CM

## 2018-12-26 DIAGNOSIS — Z5112 Encounter for antineoplastic immunotherapy: Secondary | ICD-10-CM | POA: Diagnosis not present

## 2018-12-26 DIAGNOSIS — C3490 Malignant neoplasm of unspecified part of unspecified bronchus or lung: Secondary | ICD-10-CM

## 2018-12-26 DIAGNOSIS — R0609 Other forms of dyspnea: Secondary | ICD-10-CM | POA: Diagnosis not present

## 2018-12-26 DIAGNOSIS — C342 Malignant neoplasm of middle lobe, bronchus or lung: Secondary | ICD-10-CM | POA: Diagnosis not present

## 2018-12-26 DIAGNOSIS — R5383 Other fatigue: Secondary | ICD-10-CM

## 2018-12-26 DIAGNOSIS — E860 Dehydration: Secondary | ICD-10-CM

## 2018-12-26 DIAGNOSIS — Z79899 Other long term (current) drug therapy: Secondary | ICD-10-CM | POA: Diagnosis not present

## 2018-12-26 DIAGNOSIS — I10 Essential (primary) hypertension: Secondary | ICD-10-CM

## 2018-12-26 DIAGNOSIS — R05 Cough: Secondary | ICD-10-CM | POA: Diagnosis not present

## 2018-12-26 LAB — CMP (CANCER CENTER ONLY)
ALT: 7 U/L (ref 0–44)
AST: 12 U/L — ABNORMAL LOW (ref 15–41)
Albumin: 3.3 g/dL — ABNORMAL LOW (ref 3.5–5.0)
Alkaline Phosphatase: 167 U/L — ABNORMAL HIGH (ref 38–126)
Anion gap: 11 (ref 5–15)
BUN: 21 mg/dL (ref 8–23)
CO2: 27 mmol/L (ref 22–32)
Calcium: 9.4 mg/dL (ref 8.9–10.3)
Chloride: 96 mmol/L — ABNORMAL LOW (ref 98–111)
Creatinine: 0.98 mg/dL (ref 0.44–1.00)
GFR, Est AFR Am: >60 mL/min (ref >60)
GFR, Estimated: 56 mL/min — ABNORMAL LOW (ref >60)
Glucose, Bld: 107 mg/dL — ABNORMAL HIGH (ref 70–99)
Potassium: 3.3 mmol/L — ABNORMAL LOW (ref 3.5–5.1)
Sodium: 134 mmol/L — ABNORMAL LOW (ref 135–145)
Total Bilirubin: 0.4 mg/dL (ref 0.3–1.2)
Total Protein: 7.2 g/dL (ref 6.5–8.1)

## 2018-12-26 LAB — CBC WITH DIFFERENTIAL (CANCER CENTER ONLY)
Abs Immature Granulocytes: 0.03 10*3/uL (ref 0.00–0.07)
Basophils Absolute: 0.1 10*3/uL (ref 0.0–0.1)
Basophils Relative: 1 %
Eosinophils Absolute: 0.3 10*3/uL (ref 0.0–0.5)
Eosinophils Relative: 3 %
HCT: 34.1 % — ABNORMAL LOW (ref 36.0–46.0)
Hemoglobin: 11.5 g/dL — ABNORMAL LOW (ref 12.0–15.0)
Immature Granulocytes: 0 %
Lymphocytes Relative: 12 %
Lymphs Abs: 1.2 10*3/uL (ref 0.7–4.0)
MCH: 30.7 pg (ref 26.0–34.0)
MCHC: 33.7 g/dL (ref 30.0–36.0)
MCV: 90.9 fL (ref 80.0–100.0)
Monocytes Absolute: 0.7 10*3/uL (ref 0.1–1.0)
Monocytes Relative: 7 %
Neutro Abs: 7.9 10*3/uL — ABNORMAL HIGH (ref 1.7–7.7)
Neutrophils Relative %: 77 %
Platelet Count: 316 10*3/uL (ref 150–400)
RBC: 3.75 MIL/uL — ABNORMAL LOW (ref 3.87–5.11)
RDW: 12.7 % (ref 11.5–15.5)
WBC Count: 10.1 10*3/uL (ref 4.0–10.5)
nRBC: 0 % (ref 0.0–0.2)

## 2018-12-26 LAB — TSH: TSH: 3.985 u[IU]/mL — ABNORMAL HIGH (ref 0.308–3.960)

## 2018-12-26 MED ORDER — SODIUM CHLORIDE 0.9 % IV SOLN
480.0000 mg | Freq: Once | INTRAVENOUS | Status: AC
  Administered 2018-12-26: 480 mg via INTRAVENOUS
  Filled 2018-12-26: qty 48

## 2018-12-26 MED ORDER — HEPARIN SOD (PORK) LOCK FLUSH 100 UNIT/ML IV SOLN
500.0000 [IU] | Freq: Once | INTRAVENOUS | Status: AC | PRN
  Administered 2018-12-26: 12:00:00 500 [IU]
  Filled 2018-12-26: qty 5

## 2018-12-26 MED ORDER — SODIUM CHLORIDE 0.9% FLUSH
10.0000 mL | INTRAVENOUS | Status: DC | PRN
  Administered 2018-12-26: 10 mL
  Filled 2018-12-26: qty 10

## 2018-12-26 MED ORDER — SODIUM CHLORIDE 0.9 % IV SOLN
Freq: Once | INTRAVENOUS | Status: AC
  Administered 2018-12-26: 11:00:00 via INTRAVENOUS
  Filled 2018-12-26: qty 250

## 2018-12-26 NOTE — Progress Notes (Signed)
[facility] Telephone:([phone]   Fax:(336) [place], MD [address] [place] [address]  DIAGNOSIS: Recurrent non-small cell lung cancer, squamous cell carcinoma initially diagnosed as unresectable a stage IIIa in February 2015.  PRIOR THERAPY: 1) Status post exploratory right VATS with mediastinal biopsy under the care of Dr. Roxan Hockey on 11/13/2013. The tumor was found to be unresectable at that time.[name] 2) Concurrent chemoradiation with weekly carboplatin for AUC of 2 and paclitaxel 45 mg/M2, status post 6 cycles with partial response. First cycle was given on 12/01/2013. 3) Consolidation chemotherapy with carboplatin for AUC of 5 and paclitaxel 175 mg/M2 every 3 weeks with Neulasta support. First dose 02/23/2014. Status post 3 cycles with partial response. 4) Systemic chemotherapy with carboplatin for AUC of 5 and paclitaxel 175 MG/M2 every 3 weeks with Neulasta support. Status post 6 cycles. First dose was given on 08/14/2016.  Last dose was giving 12/11/2016 with partial response.  CURRENT THERAPY: Second line treatment with immunotherapy with Nivolumab 480 mg IV every 4 weeks, first dose Jan 25, 2018.  Status post 12 cycles.  INTERVAL HISTORY: Kimberly [name] 78 y.o. female returns to the clinic today for follow-up visit.  The patient is feeling fine today with no concerning complaints except for fatigue and lack of appetite.  She lost few pounds since her last visit.  She denied having any chest pain, but has shortness of breath with exertion with no cough or hemoptysis.  She denied having any fever or chills.  She has no nausea, vomiting, diarrhea or constipation.  She has no headache or visual changes.  She continues to tolerate her treatment with nivolumab fairly well.  The patient had repeat CT scan of the chest, abdomen and pelvis performed recently and she is here for evaluation and discussion of her  scan results.  MEDICAL HISTORY: Past Medical History:  Diagnosis Date  . Allergic asthma without acute exacerbation or status asthmaticus   . Aortic insufficiency   . Arthritis   . Atrial fibrillation ([place])   . Back pain 08/14/2016  . Bronchitis   . Cough    dry, endobronchial mass  . Dyslipidemia   . Family history of adverse reaction to anesthesia    pts son also experiences N/V  . GERD (gastroesophageal reflux disease)   . Heart murmur   . History of blood transfusion   . History of bronchitis   . History of chemotherapy   . History of nuclear stress test 02/26/2006   exercise myoview; normal pattern of perfusion; low risk scan   . Hypertension   . Hypothyroidism   . Mitral insufficiency   . Pneumonia   . PONV (postoperative nausea and vomiting)   . Radiation 12/01/13-01/08/14   50.4 gray to right central chest  . Renal mass, left 04/12/2017  . Shortness of breath    with exertion  . Squamous cell carcinoma of lung ([place])   . Syncope    "states she has passed out a few times. dr is trying to find cause.last time when geeting ready to go home after video bronch/bx  . Thyroid disease     ALLERGIES:  is allergic to lactose intolerance (gi); amiodarone; augmentin [amoxicillin-pot clavulanate]; milk-related compounds; and oxycontin  [oxycodone hcl].  MEDICATIONS:  Current Outpatient Medications  Medication Sig Dispense Refill  . albuterol (PROVENTIL HFA;VENTOLIN HFA) 108 (90 BASE) MCG/ACT inhaler Inhale 1 puff into the lungs every  6 (six) hours as needed for wheezing or shortness of breath.     [name] amLODipine (NORVASC) 5 MG tablet Take 5 mg by mouth daily.  3  . atorvastatin (LIPITOR) 20 MG tablet Take 20 mg by mouth every morning.     . chlorthalidone (HYGROTON) 25 MG tablet TAKE 1 TABLET BY MOUTH  DAILY 90 tablet 1  . fluticasone (FLONASE) 50 MCG/ACT nasal spray Place 2 sprays into both nostrils daily.    [name] KLOR-CON 8 MEQ tablet Take 8 mEq every other day by mouth.  3  .  levothyroxine (SYNTHROID, LEVOTHROID) 100 MCG tablet Take 100 mcg daily by mouth.    . lidocaine-prilocaine (EMLA) cream Apply 1 application topically as needed. (Patient taking differently: Apply 1 application topically as needed (for port access). ) 30 g 0  . metoprolol succinate (TOPROL-XL) 25 MG 24 hr tablet Take 25 mg by mouth daily.    . Mometasone Furoate (ASMANEX HFA) 100 MCG/ACT AERO Inhale 2 puffs into the lungs 2 (two) times daily. 39 g 3  . montelukast (SINGULAIR) 10 MG tablet Take 10 mg by mouth at bedtime.     . Multiple Vitamins-Iron (MULTIVITAMIN/IRON PO) Take 1 tablet by mouth daily with breakfast.     . pantoprazole (PROTONIX) 40 MG tablet TAKE 1 TABLET BY MOUTH TWO  TIMES DAILY 120 tablet 0  . prochlorperazine (COMPAZINE) 10 MG tablet Take 1 tablet (10 mg total) by mouth every 6 (six) hours as needed for nausea or vomiting. 30 tablet 0  . traMADol (ULTRAM) 50 MG tablet Take 50 mg every 8 (eight) hours as needed by mouth. Take 1/2 tablet  3   No current facility-administered medications for this visit.    Facility-Administered Medications Ordered in Other Visits  Medication Dose Route Frequency Provider Last Rate Last Dose  . sodium chloride flush (NS) 0.9 % injection 10 mL  10 mL Intracatheter PRN Curt Bears, MD   10 mL at 12/26/18 1610    SURGICAL HISTORY:  Past Surgical History:  Procedure Laterality Date  . CARDIAC CATHETERIZATION  07/08/2008   normal coronaries  . CARPAL TUNNEL RELEASE Right 1988  . COLONOSCOPY N/A 02/11/2016   Procedure: COLONOSCOPY;  Surgeon: Carol Ada, MD;  Location: [facility];  Service: Endoscopy;  Laterality: N/A;  . DILATION AND CURETTAGE OF UTERUS    . ESOPHAGOGASTRODUODENOSCOPY N/A 02/08/2017   Procedure: ESOPHAGOGASTRODUODENOSCOPY (EGD);  Surgeon: Carol Ada, MD;  Location: [facility];  Service: Endoscopy;  Laterality: N/A;  . EYE SURGERY Bilateral   . H/O MET Test w/PFT  04/02/2012   low risk; peak VO2 77% predicted  . IR  GENERIC HISTORICAL  09/12/2016   IR FLUORO GUIDE PORT INSERTION RIGHT 09/12/2016 [name], MD [facility]  . IR GENERIC HISTORICAL  09/12/2016   IR US GUIDE VASC ACCESS RIGHT 09/12/2016 [name], MD [facility]  . JOINT REPLACEMENT  2003   thumb rt  . KNEE ARTHROSCOPY  12   rt meniscus  . MEDIASTINOSCOPY N/A 10/30/2013   Procedure: MEDIASTINOSCOPY;  Surgeon: Melrose Nakayama, MD;  Location: [facility];  Service: Thoracic;  Laterality: N/A;  . ROTATOR CUFF REPAIR  2006   ? side  . THYROIDECTOMY  1973  . TRANSTHORACIC ECHOCARDIOGRAM  09/25/2012   EF 55-60%; mild LVH & mild concentric hypertrophy; mild AV regurg; RV systolic pressure increase consistent with mild pulm HTN  . VIDEO ASSISTED THORACOSCOPY (VATS)/ LOBECTOMY Right 11/13/2013   Procedure: VIDEO ASSISTED THORACOSCOPY (VATS) with mediastinal  biopsies;  Surgeon: Melrose Nakayama, MD;  Location: [facility];  Service: Thoracic;  Laterality: Right;  RIGHT VATS,mediastinal biopsies  . VIDEO BRONCHOSCOPY Bilateral 09/25/2013   Procedure: VIDEO BRONCHOSCOPY WITHOUT FLUORO;  Surgeon: Tanda Rockers, MD;  Location: [facility];  Service: Cardiopulmonary;  Laterality: Bilateral;  . VIDEO BRONCHOSCOPY WITH ENDOBRONCHIAL ULTRASOUND N/A 10/30/2013   Procedure: VIDEO BRONCHOSCOPY WITH ENDOBRONCHIAL ULTRASOUND;  Surgeon: Melrose Nakayama, MD;  Location: [facility];  Service: Thoracic;  Laterality: N/A;    REVIEW OF SYSTEMS:  Constitutional: positive for fatigue Eyes: negative Ears, nose, mouth, throat, and face: negative Respiratory: positive for dyspnea on exertion Cardiovascular: negative Gastrointestinal: negative Genitourinary:negative Integument/breast: negative Hematologic/lymphatic: negative Musculoskeletal:negative Neurological: negative Behavioral/Psych: negative Endocrine: negative Allergic/Immunologic: negative   PHYSICAL EXAMINATION: General appearance: alert, cooperative and no distress Head: Normocephalic, without  obvious abnormality, atraumatic Neck: no adenopathy, no JVD, supple, symmetrical, trachea midline and thyroid not enlarged, symmetric, no tenderness/mass/nodules Lymph nodes: Cervical, supraclavicular, and axillary nodes normal. Resp: clear to auscultation bilaterally Back: symmetric, no curvature. ROM normal. No CVA tenderness. Cardio: regular rate and rhythm, S1, S2 normal, no murmur, click, rub or gallop GI: soft, non-tender; bowel sounds normal; no masses,  no organomegaly Extremities: extremities normal, atraumatic, no cyanosis or edema Neurologic: Alert and oriented X 3, normal strength and tone. Normal symmetric reflexes. Normal coordination and gait  ECOG PERFORMANCE STATUS: 1 - Symptomatic but completely ambulatory  Blood pressure 137/74, pulse 81, temperature 98.1 F (36.7 C), temperature source Oral, resp. rate 18, height 4' 10" (1.473 m), weight 147 lb 11.2 oz (67 kg), SpO2 100 %.  LABORATORY DATA: Lab Results  Component Value Date   WBC 8.7 11/28/2018   HGB 11.0 (L) 11/28/2018   HCT 33.7 (L) 11/28/2018   MCV 94.7 11/28/2018   PLT 314 11/28/2018      Chemistry      Component Value Date/Time   NA 138 11/28/2018 0923   NA 140 07/12/2017 1212   K 3.5 11/28/2018 0923   K 3.5 07/12/2017 1212   CL 99 11/28/2018 0923   CO2 29 11/28/2018 0923   CO2 27 07/12/2017 1212   BUN 16 11/28/2018 0923   BUN 27.5 (H) 07/12/2017 1212   CREATININE 0.90 11/28/2018 0923   CREATININE 1.0 07/12/2017 1212      Component Value Date/Time   CALCIUM 9.4 11/28/2018 0923   CALCIUM 10.0 07/12/2017 1212   ALKPHOS 160 (H) 11/28/2018 0923   ALKPHOS 119 07/12/2017 1212   AST 12 (L) 11/28/2018 0923   AST 16 07/12/2017 1212   ALT 9 11/28/2018 0923   ALT 15 07/12/2017 1212   BILITOT 0.5 11/28/2018 0923   BILITOT 0.48 07/12/2017 1212       RADIOGRAPHIC STUDIES: Ct Chest W Contrast  Result Date: 12/20/2018 CLINICAL DATA:  Right-sided lung cancer diagnosed 2015 and 2018 with radiation  therapy completed in 2016. Chemotherapy completed in 2018. Non-small-cell. Restaging. EXAM: CT CHEST, ABDOMEN, AND PELVIS WITH CONTRAST TECHNIQUE: Multidetector CT imaging of the chest, abdomen and pelvis was performed following the standard protocol during bolus administration of intravenous contrast. CONTRAST:  146m OMNIPAQUE IOHEXOL 300 MG/ML  SOLN COMPARISON:  09/30/2017 FINDINGS: CT CHEST FINDINGS Cardiovascular: Right Port-A-Cath tip at low SVC. Aortic atherosclerosis. Normal heart size, without pericardial effusion. Multivessel coronary artery atherosclerosis. No central pulmonary embolism, on this non-dedicated study. Mediastinum/Nodes: Thyroidectomy. No supraclavicular adenopathy. No mediastinal or hilar adenopathy. Lungs/Pleura: Right-sided pleural thickening. Calcified left lower lobe pulmonary nodule on image 100/4. More cephalad, a left lower  lobe subpleural pulmonary nodule of 4 mm is similar and may represent a subpleural lymph node. Near complete resolution of previously described lower lobe infection/inflammation. There is minimal anterior left lower lobe ground-glass opacity on image 97/4. Similar geographic consolidation and traction bronchiectasis within the medial right lung, presumably radiation induced. The right middle lobe lung mass with central necrosis measures on the order of 4.9 x 4.2 cm on image 27/2. Compare 4.2 x 3.7 cm on the prior exam (when remeasured). 2.9 cm craniocaudal today on sagittal image 56. Compare 2.0 cm on the prior exam. Musculoskeletal: No acute osseous abnormality. Intramuscular lipoma positioned posterior to left scapula at 2.9 cm, similar. CT ABDOMEN PELVIS FINDINGS Hepatobiliary: Mildly irregular hepatic capsule, including on image 50/2. No focal liver lesion. Normal gallbladder, without biliary ductal dilatation. Pancreas: Normal, without mass or ductal dilatation. Spleen: Normal in size, without focal abnormality. Adrenals/Urinary Tract: Normal adrenal glands.  Bilateral too small to characterize renal lesions. An interpolar 13 mm right renal lesion demonstrates complexity in its inferior portion but is similar in size. Normal urinary bladder. Stomach/Bowel: Normal stomach, without wall thickening. Scattered colonic diverticula. Normal terminal ileum and appendix. Normal small bowel. Vascular/Lymphatic: Aortic and branch vessel atherosclerosis. No abdominopelvic adenopathy. Reproductive: Normal uterus and adnexa. Other: No significant free fluid. No evidence of omental or peritoneal disease. Musculoskeletal: Right lower extremity primarily fat density lesion of 9.4 cm is similar and likely arises intramuscularly. Incompletely imaged. Mild osteopenia. Degenerative disc disease is advanced at L2-3. IMPRESSION: CT CHEST IMPRESSION 1. Interval enlargement of right middle lobe lung mass. 2. Otherwise, similar appearance of radiation change throughout the medial right lung. 3. Near complete resolution of bibasilar infection/inflammation. 4. No thoracic adenopathy. 5. Aortic atherosclerosis (ICD10-I70.0), coronary artery atherosclerosis. CT ABDOMEN AND PELVIS IMPRESSION 1. No acute process or evidence of metastatic disease in the abdomen or pelvis. 2. Mildly irregular hepatic capsule. Cannot exclude mild cirrhosis. Correlate with risk factors. 3. Similar right renal lesion, suspicious for a tiny renal cell carcinoma. Recommend attention on follow-up. Electronically Signed   By: Abigail Miyamoto M.D.   On: 12/20/2018 08:33   Ct Abdomen Pelvis W Contrast  Result Date: 12/20/2018 CLINICAL DATA:  Right-sided lung cancer diagnosed 2015 and 2018 with radiation therapy completed in 2016. Chemotherapy completed in 2018. Non-small-cell. Restaging. EXAM: CT CHEST, ABDOMEN, AND PELVIS WITH CONTRAST TECHNIQUE: Multidetector CT imaging of the chest, abdomen and pelvis was performed following the standard protocol during bolus administration of intravenous contrast. CONTRAST:  163m OMNIPAQUE  IOHEXOL 300 MG/ML  SOLN COMPARISON:  09/30/2017 FINDINGS: CT CHEST FINDINGS Cardiovascular: Right Port-A-Cath tip at low SVC. Aortic atherosclerosis. Normal heart size, without pericardial effusion. Multivessel coronary artery atherosclerosis. No central pulmonary embolism, on this non-dedicated study. Mediastinum/Nodes: Thyroidectomy. No supraclavicular adenopathy. No mediastinal or hilar adenopathy. Lungs/Pleura: Right-sided pleural thickening. Calcified left lower lobe pulmonary nodule on image 100/4. More cephalad, a left lower lobe subpleural pulmonary nodule of 4 mm is similar and may represent a subpleural lymph node. Near complete resolution of previously described lower lobe infection/inflammation. There is minimal anterior left lower lobe ground-glass opacity on image 97/4. Similar geographic consolidation and traction bronchiectasis within the medial right lung, presumably radiation induced. The right middle lobe lung mass with central necrosis measures on the order of 4.9 x 4.2 cm on image 27/2. Compare 4.2 x 3.7 cm on the prior exam (when remeasured). 2.9 cm craniocaudal today on sagittal image 56. Compare 2.0 cm on the prior exam. Musculoskeletal: No acute osseous abnormality. Intramuscular  lipoma positioned posterior to left scapula at 2.9 cm, similar. CT ABDOMEN PELVIS FINDINGS Hepatobiliary: Mildly irregular hepatic capsule, including on image 50/2. No focal liver lesion. Normal gallbladder, without biliary ductal dilatation. Pancreas: Normal, without mass or ductal dilatation. Spleen: Normal in size, without focal abnormality. Adrenals/Urinary Tract: Normal adrenal glands. Bilateral too small to characterize renal lesions. An interpolar 13 mm right renal lesion demonstrates complexity in its inferior portion but is similar in size. Normal urinary bladder. Stomach/Bowel: Normal stomach, without wall thickening. Scattered colonic diverticula. Normal terminal ileum and appendix. Normal small bowel.  Vascular/Lymphatic: Aortic and branch vessel atherosclerosis. No abdominopelvic adenopathy. Reproductive: Normal uterus and adnexa. Other: No significant free fluid. No evidence of omental or peritoneal disease. Musculoskeletal: Right lower extremity primarily fat density lesion of 9.4 cm is similar and likely arises intramuscularly. Incompletely imaged. Mild osteopenia. Degenerative disc disease is advanced at L2-3. IMPRESSION: CT CHEST IMPRESSION 1. Interval enlargement of right middle lobe lung mass. 2. Otherwise, similar appearance of radiation change throughout the medial right lung. 3. Near complete resolution of bibasilar infection/inflammation. 4. No thoracic adenopathy. 5. Aortic atherosclerosis (ICD10-I70.0), coronary artery atherosclerosis. CT ABDOMEN AND PELVIS IMPRESSION 1. No acute process or evidence of metastatic disease in the abdomen or pelvis. 2. Mildly irregular hepatic capsule. Cannot exclude mild cirrhosis. Correlate with risk factors. 3. Similar right renal lesion, suspicious for a tiny renal cell carcinoma. Recommend attention on follow-up. Electronically Signed   By: Abigail Miyamoto M.D.   On: 12/20/2018 08:33    ASSESSMENT AND PLAN: This is a very pleasant 79 years old white female with recurrent non-small cell lung cancer, squamous cell carcinoma. The patient completed 6 cycles of systemic chemotherapy with carboplatin and paclitaxel and tolerated her treatment well except for fatigue as well as peripheral neuropathy. The patient has been in observation.  She continues to have persistent dry cough and shortness of breath. She had evidence for disease progression on restaging scan. The patient was started on treatment with second line immunotherapy with Nivolumab 480 mg IV every 4 weeks status post 12 cycles.  The patient continues to tolerate this treatment well with no concerning adverse effects. She had repeat CT scan of the chest performed recently.  I personally and independently  reviewed the scan images and discussed the results with the patient and her daughter who was available by phone today. Her scan showed no concerning findings for disease progression except for mild increase in the area of radiation changes and suspicious right middle lobe lung mass. The changes are not significantly concerning at this point. I recommended for the patient to continue her current treatment with nivolumab and she will proceed with cycle #13 today.  She is in agreement with the current plan. She will come back for follow-up visit in 4 weeks for evaluation before the next cycle of her treatment. The patient was advised to call immediately if she has any concerning symptoms in the interval. The patient voices understanding of current disease status and treatment options and is in agreement with the current care plan. All questions were answered. The patient knows to call the clinic with any problems, questions or concerns. We can certainly see the patient much sooner if necessary.   Disclaimer: This note was dictated with voice recognition software. Similar sounding words can inadvertently be transcribed and may not be corrected upon review.

## 2018-12-26 NOTE — Patient Instructions (Signed)

## 2018-12-26 NOTE — Patient Instructions (Signed)
Coronavirus (COVID-19) Are you at risk?  Are you at risk for the Coronavirus (COVID-19)?  To be considered HIGH RISK for Coronavirus (COVID-19), you have to meet the following criteria:  . Traveled to [place], [place], [place], [place] or [place]; or in the [place] to [place], [place], [place], or [place]; and have fever, cough, and shortness of breath within the last 2 weeks of travel OR . Been in close contact with a person diagnosed with COVID-19 within the last 2 weeks and have fever, cough, and shortness of breath . IF YOU DO NOT MEET THESE CRITERIA, YOU ARE CONSIDERED LOW RISK FOR COVID-19.  What to do if you are HIGH RISK for COVID-19?  . If you are having a medical emergency, call 911. . Seek medical care right away. Before you go to a doctor's office, urgent care or emergency department, call ahead and tell them about your recent travel, contact with someone diagnosed with COVID-19, and your symptoms. You should receive instructions from your physician's office regarding next steps of care.  . When you arrive at healthcare provider, tell the healthcare staff immediately you have returned from visiting [place], [place], [place], [place] or [place]; or traveled in the [place] to [place], [place], [place], or [place]; in the last two weeks or you have been in close contact with a person diagnosed with COVID-19 in the last 2 weeks.   . Tell the health care staff about your symptoms: fever, cough and shortness of breath. . After you have been seen by a medical provider, you will be either: o Tested for (COVID-19) and discharged home on quarantine except to seek medical care if symptoms worsen, and asked to  - Stay home and avoid contact with others until you get your results (4-5 days)  - Avoid travel on public transportation if possible (such as bus, train, or airplane) or o Sent to the Emergency Department by EMS for evaluation, COVID-19 testing, and possible  admission depending on your condition and test results.  What to do if you are LOW RISK for COVID-19?  Reduce your risk of any infection by using the same precautions used for avoiding the common cold or flu:  . Wash your hands often with soap and warm water for at least 20 seconds.  If soap and water are not readily available, use an alcohol-based hand sanitizer with at least 60% alcohol.  . If coughing or sneezing, cover your mouth and nose by coughing or sneezing into the elbow areas of your shirt or coat, into a tissue or into your sleeve (not your hands). . Avoid shaking hands with others and consider head nods or verbal greetings only. . Avoid touching your eyes, nose, or mouth with unwashed hands.  . Avoid close contact with people who are sick. . Avoid places or events with large numbers of people in one location, like concerts or sporting events. . Carefully consider travel plans you have or are making. . If you are planning any travel outside or inside the [place], visit the CDC's Travelers' Health webpage for the latest health notices. . If you have some symptoms but not all symptoms, continue to monitor at home and seek medical attention if your symptoms worsen. . If you are having a medical emergency, call 911.   ADDITIONAL HEALTHCARE OPTIONS FOR PATIENTS  [facility] / e-Visit: [email or web address]         [facility]: [phone]  [facility]   Urgent Care: [facility]: [facility] Discharge Instructions for Patients Receiving Chemotherapy  Today you received the following chemotherapy agents: Nivolumab (Opdivo)  To help prevent nausea and vomiting after your treatment, we encourage you to take your nausea medication as directed.    If you develop nausea and vomiting that is not controlled by your nausea medication, call the clinic.    BELOW ARE SYMPTOMS THAT SHOULD BE REPORTED IMMEDIATELY:  *FEVER GREATER THAN 100.5 F  *CHILLS WITH OR WITHOUT FEVER  NAUSEA AND VOMITING THAT IS NOT CONTROLLED WITH YOUR NAUSEA MEDICATION  *UNUSUAL SHORTNESS OF BREATH  *UNUSUAL BRUISING OR BLEEDING  TENDERNESS IN MOUTH AND THROAT WITH OR WITHOUT PRESENCE OF ULCERS  *URINARY PROBLEMS  *BOWEL PROBLEMS  UNUSUAL RASH Items with * indicate a potential emergency and should be followed up as soon as possible.  Feel free to call the clinic should you have any questions or concerns. The clinic phone number is [phone].  Please show the [facility] at check-in to the Emergency Department and triage nurse.

## 2018-12-27 ENCOUNTER — Telehealth: Payer: Self-pay | Admitting: Internal Medicine

## 2018-12-27 NOTE — Telephone Encounter (Signed)
Tried to reach regarding schedule °

## 2018-12-30 DIAGNOSIS — M7541 Impingement syndrome of right shoulder: Secondary | ICD-10-CM | POA: Diagnosis not present

## 2018-12-30 DIAGNOSIS — M25511 Pain in right shoulder: Secondary | ICD-10-CM | POA: Diagnosis not present

## 2019-01-03 ENCOUNTER — Telehealth: Payer: Self-pay | Admitting: Internal Medicine

## 2019-01-03 ENCOUNTER — Telehealth: Payer: Self-pay

## 2019-01-03 NOTE — Telephone Encounter (Signed)
[phone]/ my chart/ consent/ pre reg completed

## 2019-01-03 NOTE — Telephone Encounter (Signed)

## 2019-01-06 ENCOUNTER — Encounter: Payer: Self-pay | Admitting: Internal Medicine

## 2019-01-06 ENCOUNTER — Telehealth (INDEPENDENT_AMBULATORY_CARE_PROVIDER_SITE_OTHER): Payer: Medicare Other | Admitting: Internal Medicine

## 2019-01-06 VITALS — BP 139/81 | HR 78 | Temp 97.7°F | Ht <= 58 in | Wt 144.6 lb

## 2019-01-06 DIAGNOSIS — E782 Mixed hyperlipidemia: Secondary | ICD-10-CM | POA: Diagnosis not present

## 2019-01-06 DIAGNOSIS — I10 Essential (primary) hypertension: Secondary | ICD-10-CM

## 2019-01-06 DIAGNOSIS — C3490 Malignant neoplasm of unspecified part of unspecified bronchus or lung: Secondary | ICD-10-CM

## 2019-01-06 DIAGNOSIS — R0602 Shortness of breath: Secondary | ICD-10-CM

## 2019-01-06 DIAGNOSIS — I480 Paroxysmal atrial fibrillation: Secondary | ICD-10-CM

## 2019-01-06 DIAGNOSIS — C342 Malignant neoplasm of middle lobe, bronchus or lung: Secondary | ICD-10-CM

## 2019-01-06 DIAGNOSIS — Z7189 Other specified counseling: Secondary | ICD-10-CM

## 2019-01-06 NOTE — Progress Notes (Signed)
Virtual Visit via Video Note   This visit type was conducted due to national recommendations for restrictions regarding the COVID-19 Pandemic (e.g. social distancing) in an effort to limit this patient's exposure and mitigate transmission in our community.  Due to her co-morbid illnesses, this patient is at least at moderate risk for complications without adequate follow up.  This format is felt to be most appropriate for this patient at this time.  All issues noted in this document were discussed and addressed.  A limited physical exam was performed with this format.  Please refer to the patient's chart for her consent to telehealth for [facility].   Evaluation Performed:  Doximity video visit  Date:  01/06/2019   ID:  Kimberly [name], Kimberly [name] 07-29-1941, MRN [id number]  Patient Location:  [place] [address]  Provider location:   [address] [place], [place] [address]  PCP:  Deland Pretty, MD  Cardiologist:  No primary care provider on file. Electrophysiologist:  None   Chief Complaint:  Occasional cough  History of Present Illness:    Kimberly [name] is a 78 y.o. female who presents via audio/video conferencing for a telehealth visit today.  Kimberly [name] was seen today in follow-up.  Overall she continues to hang in there.  Unfortunately recently imaging showed that her lung cancer has increased in size.  She is on Opdivo, and is been followed by Dr. Julien Nordmann.  She denies any recurrent atrial fibrillation.  She continues to have some mild nocturnal cough which is improved somewhat with an inhaler.  She denies any chest pain.  Lab work last summer showed cholesterol reasonably well controlled with LDL less than 100 her goal.  She has no significant leg edema.  The patient does not have symptoms concerning for COVID-19 infection (fever, chills, cough, or new SHORTNESS OF BREATH).    Prior CV studies:   The following studies were reviewed today:  Lab  work Recent chest CT  PMHx:  Past Medical History:  Diagnosis Date  . Allergic asthma without acute exacerbation or status asthmaticus   . Aortic insufficiency   . Arthritis   . Atrial fibrillation ([place])   . Back pain 08/14/2016  . Bronchitis   . Cough    dry, endobronchial mass  . Dyslipidemia   . Family history of adverse reaction to anesthesia    pts son also experiences N/V  . GERD (gastroesophageal reflux disease)   . Heart murmur   . History of blood transfusion   . History of bronchitis   . History of chemotherapy   . History of nuclear stress test 02/26/2006   exercise myoview; normal pattern of perfusion; low risk scan   . Hypertension   . Hypothyroidism   . Mitral insufficiency   . Pneumonia   . PONV (postoperative nausea and vomiting)   . Radiation 12/01/13-01/08/14   50.4 gray to right central chest  . Renal mass, left 04/12/2017  . Shortness of breath    with exertion  . 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 Syncope    "states she has passed out a few times. dr is trying to find cause.last time when geeting ready to go home after video bronch/bx  . Thyroid disease     Past Surgical History:  Procedure Laterality Date  . CARDIAC CATHETERIZATION  07/08/2008   normal coronaries  . CARPAL TUNNEL RELEASE Right 1988  . COLONOSCOPY N/A 02/11/2016   Procedure: COLONOSCOPY;  Surgeon: Carol Ada, MD;  Location: [facility];  Service: Endoscopy;  Laterality: N/A;  . DILATION AND CURETTAGE OF UTERUS    . ESOPHAGOGASTRODUODENOSCOPY N/A 02/08/2017   Procedure: ESOPHAGOGASTRODUODENOSCOPY (EGD);  Surgeon: Carol Ada, MD;  Location: [facility];  Service: Endoscopy;  Laterality: N/A;  . EYE SURGERY Bilateral   . H/O MET Test w/PFT  04/02/2012   low risk; peak VO2 77% predicted  . IR GENERIC HISTORICAL  09/12/2016   IR FLUORO GUIDE PORT INSERTION RIGHT 09/12/2016 [name], MD [facility]  . IR GENERIC HISTORICAL  09/12/2016   IR US GUIDE VASC ACCESS RIGHT  09/12/2016 [name], MD [facility]  . JOINT REPLACEMENT  2003   thumb rt  . KNEE ARTHROSCOPY  12   rt meniscus  . MEDIASTINOSCOPY N/A 10/30/2013   Procedure: MEDIASTINOSCOPY;  Surgeon: Melrose Nakayama, MD;  Location: [facility];  Service: Thoracic;  Laterality: N/A;  . ROTATOR CUFF REPAIR  2006   ? side  . THYROIDECTOMY  1973  . TRANSTHORACIC ECHOCARDIOGRAM  09/25/2012   EF 55-60%; mild LVH & mild concentric hypertrophy; mild AV regurg; RV systolic pressure increase consistent with mild pulm HTN  . VIDEO ASSISTED THORACOSCOPY (VATS)/ LOBECTOMY Right 11/13/2013   Procedure: VIDEO ASSISTED THORACOSCOPY (VATS) with mediastinal  biopsies;  Surgeon: Melrose Nakayama, MD;  Location: [facility];  Service: Thoracic;  Laterality: Right;  RIGHT VATS,mediastinal biopsies  . VIDEO BRONCHOSCOPY Bilateral 09/25/2013   Procedure: VIDEO BRONCHOSCOPY WITHOUT FLUORO;  Surgeon: Tanda Rockers, MD;  Location: [facility];  Service: Cardiopulmonary;  Laterality: Bilateral;  . VIDEO BRONCHOSCOPY WITH ENDOBRONCHIAL ULTRASOUND N/A 10/30/2013   Procedure: VIDEO BRONCHOSCOPY WITH ENDOBRONCHIAL ULTRASOUND;  Surgeon: Melrose Nakayama, MD;  Location: [facility];  Service: Thoracic;  Laterality: N/A;    FAMHx:  Family History  Problem Relation Age of Onset  . Lung cancer Mother   . Heart attack Father   . Heart failure Maternal Grandfather   . Heart attack Paternal Grandfather     SOCHx:   reports that she quit smoking about 51 years ago. Her smoking use included cigarettes. She has a 3.00 pack-year smoking history. She has never used smokeless tobacco. She reports current alcohol use. She reports that she does not use drugs.  ALLERGIES:  Allergies  Allergen Reactions  . Lactose Intolerance (Gi) Other (See Comments)  . Amiodarone Other (See Comments)    Corneal deposits  . Augmentin [Amoxicillin-Pot Clavulanate] Diarrhea    *ended up with cdiff* Has patient had a PCN reaction causing immediate rash,  facial/tongue/throat swelling, SOB or lightheadedness with hypotension: No Has patient had a PCN reaction causing severe rash involving mucus membranes or skin necrosis: No Has patient had a PCN reaction that required hospitalization: Yes Has patient had a PCN reaction occurring within the last 10 years: Yes If all of the above answers are "NO", then may proceed with Cephalosporin use.   . Milk-Related Compounds Other (See Comments)    GI upset, projectile vomiting - can't take any dairy products  . Oxycontin  [Oxycodone Hcl] Other (See Comments)    MEDS:  Current Meds  Medication Sig  . albuterol (PROVENTIL HFA;VENTOLIN HFA) 108 (90 BASE) MCG/ACT inhaler Inhale 1 puff into the lungs every 6 (six) hours as needed for wheezing or shortness of breath.   [name] amLODipine (NORVASC) 5 MG tablet Take 5 mg by mouth daily.  [name] atorvastatin (LIPITOR) 20 MG tablet Take 20 mg by mouth every morning.   . chlorthalidone (  HYGROTON) 25 MG tablet TAKE 1 TABLET BY MOUTH  DAILY  . fluticasone (FLONASE) 50 MCG/ACT nasal spray Place 2 sprays into both nostrils daily.  [name] KLOR-CON 8 MEQ tablet Take 8 mEq every other day by mouth.  . levothyroxine (SYNTHROID, LEVOTHROID) 100 MCG tablet Take 100 mcg daily by mouth.  . lidocaine-prilocaine (EMLA) cream Apply 1 application topically as needed. (Patient taking differently: Apply 1 application topically as needed (for port access). )  . metoprolol succinate (TOPROL-XL) 25 MG 24 hr tablet Take 25 mg by mouth daily.  . Mometasone Furoate (ASMANEX HFA) 100 MCG/ACT AERO Inhale 2 puffs into the lungs 2 (two) times daily.  . montelukast (SINGULAIR) 10 MG tablet Take 10 mg by mouth at bedtime.   . Multiple Vitamins-Iron (MULTIVITAMIN/IRON PO) Take 1 tablet by mouth daily with breakfast.   . pantoprazole (PROTONIX) 40 MG tablet TAKE 1 TABLET BY MOUTH TWO  TIMES DAILY  . prochlorperazine (COMPAZINE) 10 MG tablet Take 1 tablet (10 mg total) by mouth every 6 (six) hours as needed  for nausea or vomiting.  . traMADol (ULTRAM) 50 MG tablet Take 50 mg every 8 (eight) hours as needed by mouth. Take 1/2 tablet     ROS: Pertinent items noted in HPI and remainder of comprehensive ROS otherwise negative.  Labs/Other Tests and Data Reviewed:    Recent Labs: 12/26/2018: ALT 7; BUN 21; Creatinine 0.98; Hemoglobin 11.5; Platelet Count 316; Potassium 3.3; Sodium 134; TSH 3.985   Recent Lipid Panel Lab Results  Component Value Date/Time   CHOL 134 04/20/2015 09:45 AM   TRIG 71 04/20/2015 09:45 AM   HDL 60 04/20/2015 09:45 AM   CHOLHDL 2.2 04/20/2015 09:45 AM   LDLCALC 60 04/20/2015 09:45 AM    Wt Readings from Last 3 Encounters:  01/06/19 144 lb 9.6 oz (65.6 kg)  12/26/18 147 lb 11.2 oz (67 kg)  11/28/18 149 lb 6.4 oz (67.8 kg)     Exam:    Vital Signs:  BP 139/81   Pulse 78   Temp 97.7 F (36.5 C)   Ht 4' 10" (1.473 m)   Wt 144 lb 9.6 oz (65.6 kg)   BMI 30.22 kg/m    General appearance: alert and no distress Lungs: No audible wheezing or visual respiratory difficulty Extremities: extremities normal, atraumatic, no cyanosis or edema Skin: Skin color normal Neurologic: Mental status: Alert, oriented, thought content appropriate Psych: Pleasant  ASSESSMENT & PLAN:    1. Lung cancer (non-small cell stage III) 2. Low risk myoview stress test (EF 60%) - 05/2016 3. Persistent cough, mostly at night 4. History of vasodepressor syncope  5. Lower extremity edema  6. Hypertension 7. 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  She denies any worsening cough and her shortness of breath is stable.  Blood pressures well controlled.  She has not had any further atrial fibrillation that she is aware of.  No changes were made to her medications today.  Plan follow-up 6 months or sooner as necessary.   COVID-19 Education: The signs and symptoms of COVID-19 were discussed with the patient and how to seek care for testing (follow up with PCP or arrange E-visit).  The importance of social distancing was discussed today.  Patient Risk:   After full review of this patients clinical status, I feel that they are at least  moderate risk at this time.  Time:   Today, I have spent 25 minutes with the patient with telehealth technology discussing atrial fibrillation, edema, lung cancer, hypertension, dyslipidemia.     Medication Adjustments/Labs and Tests Ordered: Current medicines are reviewed at length with the patient today.  Concerns regarding medicines are outlined above.   Tests Ordered: No orders of the defined types were placed in this encounter.   Medication Changes: No orders of the defined types were placed in this encounter.   Disposition:  in 6 month(s)  Pixie Casino, MD, [facility], [place] Director of the Advanced Lipid Disorders &  Cardiovascular Risk Reduction Clinic Diplomate of the American Board of Clinical Lipidology Attending Cardiologist  Direct Dial: [phone]  Fax: [phone]  Website:  [email or web address]  Pixie Casino, MD  01/06/2019 10:15 AM

## 2019-01-06 NOTE — Patient Instructions (Signed)
Medication Instructions:  Continue current medications If you need a refill on your cardiac medications before your next appointment, please call your pharmacy.   Lab work: None needed If you have labs (blood work) drawn today and your tests are completely normal, you will receive your results only by: [name] MyChart Message (if you have MyChart) OR . A paper copy in the mail If you have any lab test that is abnormal or we need to change your treatment, we will call you to review the results.  Testing/Procedures: None needed  Follow-Up: At [facility], you and your health needs are our priority.  As part of our continuing mission to provide you with exceptional heart care, we have created designated Provider Care Teams.  These Care Teams include your primary Cardiologist (physician) and Advanced Practice Providers (APPs -  Physician Assistants and Nurse Practitioners) who all work together to provide you with the care you need, when you need it. You will need a follow up appointment in 6 months.  Please call our office 2 months in advance to schedule this appointment.  You may see Dr. Debara Pickett or one of the following Advanced Practice Providers on your designated Care Team: Almyra Deforest, [place] . Fabian Sharp, PA-C

## 2019-01-07 ENCOUNTER — Ambulatory Visit: Payer: Medicare Other | Admitting: Nurse Practitioner

## 2019-01-07 ENCOUNTER — Ambulatory Visit: Payer: Medicare Other | Admitting: Adult Health

## 2019-01-09 ENCOUNTER — Encounter: Payer: Self-pay | Admitting: Adult Health

## 2019-01-09 ENCOUNTER — Other Ambulatory Visit: Payer: Self-pay

## 2019-01-09 ENCOUNTER — Ambulatory Visit (INDEPENDENT_AMBULATORY_CARE_PROVIDER_SITE_OTHER): Payer: Medicare Other | Admitting: Adult Health

## 2019-01-09 VITALS — BP 112/70 | HR 88 | Temp 98.2°F | Ht 59.0 in | Wt 146.7 lb

## 2019-01-09 DIAGNOSIS — J449 Chronic obstructive pulmonary disease, unspecified: Secondary | ICD-10-CM | POA: Diagnosis not present

## 2019-01-09 DIAGNOSIS — J9611 Chronic respiratory failure with hypoxia: Secondary | ICD-10-CM | POA: Diagnosis not present

## 2019-01-09 DIAGNOSIS — J9691 Respiratory failure, unspecified with hypoxia: Secondary | ICD-10-CM | POA: Diagnosis not present

## 2019-01-09 DIAGNOSIS — C3490 Malignant neoplasm of unspecified part of unspecified bronchus or lung: Secondary | ICD-10-CM | POA: Diagnosis not present

## 2019-01-09 DIAGNOSIS — R0902 Hypoxemia: Secondary | ICD-10-CM | POA: Diagnosis not present

## 2019-01-09 DIAGNOSIS — R0602 Shortness of breath: Secondary | ICD-10-CM

## 2019-01-09 MED ORDER — ALBUTEROL SULFATE HFA 108 (90 BASE) MCG/ACT IN AERS: 1.0000 | INHALATION_SPRAY | Freq: Four times a day (QID) | RESPIRATORY_TRACT | 5 refills | Status: AC | PRN

## 2019-01-09 NOTE — Patient Instructions (Addendum)
Continue on Asmanex 2 puffs Twice daily.  May try Albuterol Inhaler 2 puffs every 6hr , before walking or heavy activity (try to limit to twice daily) , see if this helps.  Begin Oxygen 2l/m with activity ,  Set up overnight oximetry test on room air  Order for POC .  Follow up for virtual video visit in 4 weeks and As needed   Please contact office for sooner follow up if symptoms do not improve or worsen or seek emergency care

## 2019-01-09 NOTE — Progress Notes (Signed)
@Patient  ID: Kimberly [name], female    DOB: [birth year], 78 y.o.   MRN: [id number]  Chief Complaint  Patient presents with   Follow-up    Referring provider: Deland Pretty, MD  HPI: 78 year old female followed for known squamous cell lung cancer.  In March 2015 patient underwent a VATS biopsy with mediastinal exploration.  Tumor was found to be unresectable at that time.  She underwent concurrent chemoradiation.    01/09/2019 Follow up : Dyspnea and Lung Cancer  Patient presents for a 35-month follow-up.  Patient has known lung cancer undergoing active treatment.  Patient says overall she is doing okay.  She is tolerating her current immunotherapy.  She has some mild intermittent nausea but otherwise appetite is fair.  She says she does notice over the last several months that she has had increased shortness of breath with activity.  She gets winded more easily and her activity tolerance has been decreased.  Previous spirometry showed normal lung function with no airflow obstruction or restriction.  She denies any increased cough or wheezing.  She denies chest pain hemoptysis orthopnea edema or calf pain or tenderness.  Patient is followed by oncology.  She has had recent lab work that was unrevealing.  CT chest in January showed interval increase in right middle lobe mass.  Follow-up CT chest in December 20, 2018 showed interval enlargement of the right middle lobe mass.  Negative for central PE.  And near complete resolution of bibasilar infection versus inflammation.  There was no thoracic adenopathy.  And CT abdomen and pelvis was negative for metastatic disease. Patient does have some underlying asthma is on Asmanex.  She denies any increased wheezing.  She does get Asmanex through patient assistance.  Walk test in the office shows O2 saturation drop at 85% walking on room air.  O2 saturation on 2 L of oxygen at 100%.  Patient says she does feel better on oxygen and feels that she benefits  from it.  She denies any cough congestion or discolored mucus.  She has had no loss of smell.  Has had no recent travel.   TEST/EVENTS :  CT chest January 2020 showed increase in size of right middle lobe mass with peripheral enhancement, new subpleural reticular pattern in lower lobes CT abdomen and pelvis January 2020 no evidence of metastatic disease.  CT chest December 20, 2018 interval enlargement of the right middle lobe mass, negative central PE, near complete resolution of bibasilar infection/inflammation.  No thoracic adenopathy CT abdomen and pelvis December 20, 2018 no acute process or evidence of metastatic disease.   Oncology Notes :  1) Status post exploratory right VATS with mediastinal biopsy under the care of Dr. Roxan Hockey on 11/13/2013. The tumor was found to be unresectable at that time.[name] 2) Concurrent chemoradiation with weekly carboplatin for AUC of 2 and paclitaxel 45 mg/M2, status post 6 cycles with partial response. First cycle was given on 12/01/2013. 3) Consolidation chemotherapy with carboplatin for AUC of 5 and paclitaxel 175 mg/M2 every 3 weeks with Neulasta support. First dose 02/23/2014. Status post 3 cycles with partial response. 4) Systemic chemotherapy with carboplatin for AUC of 5 and paclitaxel 175 MG/M2 every 3 weeks with Neulasta support. Status post 6 cycles. First dose was given on 08/14/2016.  Last dose was giving 12/11/2016 with partial response.  CURRENT THERAPY: Second line treatment with immunotherapy with Nivolumab 480 mg IV every 4 weeks, first dose Jan 25, 2018.  Status post 11 cycles.  Allergies  Allergen Reactions   Lactose Intolerance (Gi) Other (See Comments)   Amiodarone Other (See Comments)    Corneal deposits   Augmentin [Amoxicillin-Pot Clavulanate] Diarrhea    *ended up with cdiff* Has patient had a PCN reaction causing immediate rash, facial/tongue/throat swelling, SOB or lightheadedness with hypotension: No Has patient had a PCN  reaction causing severe rash involving mucus membranes or skin necrosis: No Has patient had a PCN reaction that required hospitalization: Yes Has patient had a PCN reaction occurring within the last 10 years: Yes If all of the above answers are "NO", then may proceed with Cephalosporin use.    Milk-Related Compounds Other (See Comments)    GI upset, projectile vomiting - can't take any dairy products   Oxycontin  [Oxycodone Hcl] Other (See Comments)    Immunization History  Administered Date(s) Administered   DTaP 06/04/2013   H1N1 08/17/2008   Influenza Split 06/04/2013, 10/29/2014   Influenza, High Dose Seasonal PF 06/10/2018   Influenza-Unspecified 06/04/2014, 07/06/2015, 06/21/2016, 06/26/2017   Pneumococcal Polysaccharide-23 11/16/2013   Zoster Recombinat (Shingrix) 06/11/2018    Past Medical History:  Diagnosis Date   Allergic asthma without acute exacerbation or status asthmaticus    Aortic insufficiency    Arthritis    Atrial fibrillation ([place])    Back pain 08/14/2016   Bronchitis    Cough    dry, endobronchial mass   Dyslipidemia    Family history of adverse reaction to anesthesia    pts son also experiences N/V   GERD (gastroesophageal reflux disease)    Heart murmur    History of blood transfusion    History of bronchitis    History of chemotherapy    History of nuclear stress test 02/26/2006   exercise myoview; normal pattern of perfusion; low risk scan    Hypertension    Hypothyroidism    Mitral insufficiency    Pneumonia    PONV (postoperative nausea and vomiting)    Radiation 12/01/13-01/08/14   50.4 gray to right central chest   Renal mass, left 04/12/2017   Shortness of breath    with exertion   Squamous cell carcinoma of lung ([place])    Syncope    "states she has passed out a few times. dr is trying to find cause.last time when geeting ready to go home after video bronch/bx   Thyroid disease     Tobacco  History: Social History   Tobacco Use  Smoking Status Former Smoker   Packs/day: 0.50   Years: 6.00   Pack years: 3.00   Types: Cigarettes   Last attempt to quit: 09/05/1967   Years since quitting: 51.3  Smokeless Tobacco Never Used  Tobacco Comment   occ wine   Counseling given: Not Answered Comment: occ wine   Outpatient Medications Prior to Visit  Medication Sig Dispense Refill   amLODipine (NORVASC) 5 MG tablet Take 5 mg by mouth daily.  3   atorvastatin (LIPITOR) 20 MG tablet Take 20 mg by mouth every morning.      chlorthalidone (HYGROTON) 25 MG tablet TAKE 1 TABLET BY MOUTH  DAILY 90 tablet 1   fluticasone (FLONASE) 50 MCG/ACT nasal spray Place 2 sprays into both nostrils daily.     KLOR-CON 8 MEQ tablet Take 8 mEq every other day by mouth.  3   levothyroxine (SYNTHROID, LEVOTHROID) 100 MCG tablet Take 100 mcg daily by mouth.     lidocaine-prilocaine (EMLA) cream Apply 1 application topically as needed. (Patient taking differently: Apply 1  application topically as needed (for port access). ) 30 g 0   metoprolol succinate (TOPROL-XL) 25 MG 24 hr tablet Take 25 mg by mouth daily.     Mometasone Furoate (ASMANEX HFA) 100 MCG/ACT AERO Inhale 2 puffs into the lungs 2 (two) times daily. 39 g 3   montelukast (SINGULAIR) 10 MG tablet Take 10 mg by mouth at bedtime.      Multiple Vitamins-Iron (MULTIVITAMIN/IRON PO) Take 1 tablet by mouth daily with breakfast.      pantoprazole (PROTONIX) 40 MG tablet TAKE 1 TABLET BY MOUTH TWO  TIMES DAILY 120 tablet 0   prochlorperazine (COMPAZINE) 10 MG tablet Take 1 tablet (10 mg total) by mouth every 6 (six) hours as needed for nausea or vomiting. 30 tablet 0   traMADol (ULTRAM) 50 MG tablet Take 50 mg every 8 (eight) hours as needed by mouth. Take 1/2 tablet  3   albuterol (PROVENTIL HFA;VENTOLIN HFA) 108 (90 BASE) MCG/ACT inhaler Inhale 1 puff into the lungs every 6 (six) hours as needed for wheezing or shortness of breath.       No facility-administered medications prior to visit.      Review of Systems:   Constitutional:   No  weight loss, night sweats,  Fevers, chills,  +fatigue, or  lassitude.  HEENT:   No headaches,  Difficulty swallowing,  Tooth/dental problems, or  Sore throat,                No sneezing, itching, ear ache, nasal congestion, post nasal drip,   CV:  No chest pain,  Orthopnea, PND, swelling in lower extremities, anasarca, dizziness, palpitations, syncope. No calf pain .   GI  No heartburn, indigestion, abdominal pain, nausea, vomiting, diarrhea, change in bowel habits, loss of appetite, bloody stools.   Resp:   No excess mucus, no productive cough,  No non-productive cough,  No coughing up of blood.  No change in color of mucus.  No wheezing.  No chest wall deformity  Skin: no rash or lesions.  GU: no dysuria, change in color of urine, no urgency or frequency.  No flank pain, no hematuria   MS:  No joint pain or swelling.  No decreased range of motion.  No back pain.    Physical Exam  BP 112/70 (BP Location: Left Arm, Cuff Size: Normal)    Pulse 88    Temp 98.2 F (36.8 C) (Oral)    Ht 4\' 11"  (1.499 m)    Wt 146 lb 11.2 oz (66.5 kg)    SpO2 96%    BMI 29.63 kg/m   GEN: A/Ox3; pleasant , NAD, elderly female    HEENT:  [place]/AT,  EACs-clear, TMs-wnl, NOSE-clear, THROAT-clear, no lesions, no postnasal drip or exudate noted.   NECK:  Supple w/ fair ROM; no JVD; normal carotid impulses w/o bruits; no thyromegaly or nodules palpated; no lymphadenopathy.    RESP  Clear  P & A; w/o, wheezes/ rales/ or rhonchi. no accessory muscle use, no dullness to percussion  CARD:  RRR, no m/r/g, no peripheral edema, pulses intact, no cyanosis or clubbing. Neg homans sign, no calf pain or tendeness .   GI:   Soft & nt; nml bowel sounds; no organomegaly or masses detected.   Musco: Warm bil, no deformities or joint swelling noted.   Neuro: alert, no focal deficits noted.    Skin: Warm, no  lesions or rashes    Lab Results:  CBC    Component Value Date/Time  WBC 10.1 12/26/2018 0945   WBC 7.4 10/15/2017 1116   RBC 3.75 (L) 12/26/2018 0945   HGB 11.5 (L) 12/26/2018 0945   HGB 11.3 (L) 07/12/2017 1212   HCT 34.1 (L) 12/26/2018 0945   HCT 33.1 (L) 07/12/2017 1212   PLT 316 12/26/2018 0945   PLT 251 07/12/2017 1212   MCV 90.9 12/26/2018 0945   MCV 101.9 (H) 07/12/2017 1212   MCH 30.7 12/26/2018 0945   MCHC 33.7 12/26/2018 0945   RDW 12.7 12/26/2018 0945   RDW 12.3 07/12/2017 1212   LYMPHSABS 1.2 12/26/2018 0945   LYMPHSABS 1.4 07/12/2017 1212   MONOABS 0.7 12/26/2018 0945   MONOABS 0.6 07/12/2017 1212   EOSABS 0.3 12/26/2018 0945   EOSABS 0.2 07/12/2017 1212   BASOSABS 0.1 12/26/2018 0945   BASOSABS 0.1 07/12/2017 1212    BMET    Component Value Date/Time   NA 134 (L) 12/26/2018 0945   NA 140 07/12/2017 1212   K 3.3 (L) 12/26/2018 0945   K 3.5 07/12/2017 1212   CL 96 (L) 12/26/2018 0945   CO2 27 12/26/2018 0945   CO2 27 07/12/2017 1212   GLUCOSE 107 (H) 12/26/2018 0945   GLUCOSE 102 07/12/2017 1212   BUN 21 12/26/2018 0945   BUN 27.5 (H) 07/12/2017 1212   CREATININE 0.98 12/26/2018 0945   CREATININE 1.0 07/12/2017 1212   CALCIUM 9.4 12/26/2018 0945   CALCIUM 10.0 07/12/2017 1212   GFRNONAA 56 (L) 12/26/2018 0945   GFRAA >60 12/26/2018 0945    BNP No results found for: BNP  ProBNP  Imaging: Ct Chest W Contrast  Result Date: 12/20/2018 CLINICAL DATA:  Right-sided lung cancer diagnosed 2015 and 2018 with radiation therapy completed in 2016. Chemotherapy completed in 2018. Non-small-cell. Restaging. EXAM: CT CHEST, ABDOMEN, AND PELVIS WITH CONTRAST TECHNIQUE: Multidetector CT imaging of the chest, abdomen and pelvis was performed following the standard protocol during bolus administration of intravenous contrast. CONTRAST:  125mL OMNIPAQUE IOHEXOL 300 MG/ML  SOLN COMPARISON:  09/30/2017 FINDINGS: CT CHEST FINDINGS Cardiovascular: Right  Port-A-Cath tip at low SVC. Aortic atherosclerosis. Normal heart size, without pericardial effusion. Multivessel coronary artery atherosclerosis. No central pulmonary embolism, on this non-dedicated study. Mediastinum/Nodes: Thyroidectomy. No supraclavicular adenopathy. No mediastinal or hilar adenopathy. Lungs/Pleura: Right-sided pleural thickening. Calcified left lower lobe pulmonary nodule on image 100/4. More cephalad, a left lower lobe subpleural pulmonary nodule of 4 mm is similar and may represent a subpleural lymph node. Near complete resolution of previously described lower lobe infection/inflammation. There is minimal anterior left lower lobe ground-glass opacity on image 97/4. Similar geographic consolidation and traction bronchiectasis within the medial right lung, presumably radiation induced. The right middle lobe lung mass with central necrosis measures on the order of 4.9 x 4.2 cm on image 27/2. Compare 4.2 x 3.7 cm on the prior exam (when remeasured). 2.9 cm craniocaudal today on sagittal image 56. Compare 2.0 cm on the prior exam. Musculoskeletal: No acute osseous abnormality. Intramuscular lipoma positioned posterior to left scapula at 2.9 cm, similar. CT ABDOMEN PELVIS FINDINGS Hepatobiliary: Mildly irregular hepatic capsule, including on image 50/2. No focal liver lesion. Normal gallbladder, without biliary ductal dilatation. Pancreas: Normal, without mass or ductal dilatation. Spleen: Normal in size, without focal abnormality. Adrenals/Urinary Tract: Normal adrenal glands. Bilateral too small to characterize renal lesions. An interpolar 13 mm right renal lesion demonstrates complexity in its inferior portion but is similar in size. Normal urinary bladder. Stomach/Bowel: Normal stomach, without wall thickening. Scattered colonic diverticula. Normal terminal  ileum and appendix. Normal small bowel. Vascular/Lymphatic: Aortic and branch vessel atherosclerosis. No abdominopelvic adenopathy.  Reproductive: Normal uterus and adnexa. Other: No significant free fluid. No evidence of omental or peritoneal disease. Musculoskeletal: Right lower extremity primarily fat density lesion of 9.4 cm is similar and likely arises intramuscularly. Incompletely imaged. Mild osteopenia. Degenerative disc disease is advanced at L2-3. IMPRESSION: CT CHEST IMPRESSION 1. Interval enlargement of right middle lobe lung mass. 2. Otherwise, similar appearance of radiation change throughout the medial right lung. 3. Near complete resolution of bibasilar infection/inflammation. 4. No thoracic adenopathy. 5. Aortic atherosclerosis (ICD10-I70.0), coronary artery atherosclerosis. CT ABDOMEN AND PELVIS IMPRESSION 1. No acute process or evidence of metastatic disease in the abdomen or pelvis. 2. Mildly irregular hepatic capsule. Cannot exclude mild cirrhosis. Correlate with risk factors. 3. Similar right renal lesion, suspicious for a tiny renal cell carcinoma. Recommend attention on follow-up. Electronically Signed   By: Abigail Miyamoto M.D.   On: 12/20/2018 08:33   Ct Abdomen Pelvis W Contrast  Result Date: 12/20/2018 CLINICAL DATA:  Right-sided lung cancer diagnosed 2015 and 2018 with radiation therapy completed in 2016. Chemotherapy completed in 2018. Non-small-cell. Restaging. EXAM: CT CHEST, ABDOMEN, AND PELVIS WITH CONTRAST TECHNIQUE: Multidetector CT imaging of the chest, abdomen and pelvis was performed following the standard protocol during bolus administration of intravenous contrast. CONTRAST:  134mL OMNIPAQUE IOHEXOL 300 MG/ML  SOLN COMPARISON:  09/30/2017 FINDINGS: CT CHEST FINDINGS Cardiovascular: Right Port-A-Cath tip at low SVC. Aortic atherosclerosis. Normal heart size, without pericardial effusion. Multivessel coronary artery atherosclerosis. No central pulmonary embolism, on this non-dedicated study. Mediastinum/Nodes: Thyroidectomy. No supraclavicular adenopathy. No mediastinal or hilar adenopathy. Lungs/Pleura:  Right-sided pleural thickening. Calcified left lower lobe pulmonary nodule on image 100/4. More cephalad, a left lower lobe subpleural pulmonary nodule of 4 mm is similar and may represent a subpleural lymph node. Near complete resolution of previously described lower lobe infection/inflammation. There is minimal anterior left lower lobe ground-glass opacity on image 97/4. Similar geographic consolidation and traction bronchiectasis within the medial right lung, presumably radiation induced. The right middle lobe lung mass with central necrosis measures on the order of 4.9 x 4.2 cm on image 27/2. Compare 4.2 x 3.7 cm on the prior exam (when remeasured). 2.9 cm craniocaudal today on sagittal image 56. Compare 2.0 cm on the prior exam. Musculoskeletal: No acute osseous abnormality. Intramuscular lipoma positioned posterior to left scapula at 2.9 cm, similar. CT ABDOMEN PELVIS FINDINGS Hepatobiliary: Mildly irregular hepatic capsule, including on image 50/2. No focal liver lesion. Normal gallbladder, without biliary ductal dilatation. Pancreas: Normal, without mass or ductal dilatation. Spleen: Normal in size, without focal abnormality. Adrenals/Urinary Tract: Normal adrenal glands. Bilateral too small to characterize renal lesions. An interpolar 13 mm right renal lesion demonstrates complexity in its inferior portion but is similar in size. Normal urinary bladder. Stomach/Bowel: Normal stomach, without wall thickening. Scattered colonic diverticula. Normal terminal ileum and appendix. Normal small bowel. Vascular/Lymphatic: Aortic and branch vessel atherosclerosis. No abdominopelvic adenopathy. Reproductive: Normal uterus and adnexa. Other: No significant free fluid. No evidence of omental or peritoneal disease. Musculoskeletal: Right lower extremity primarily fat density lesion of 9.4 cm is similar and likely arises intramuscularly. Incompletely imaged. Mild osteopenia. Degenerative disc disease is advanced at L2-3.  IMPRESSION: CT CHEST IMPRESSION 1. Interval enlargement of right middle lobe lung mass. 2. Otherwise, similar appearance of radiation change throughout the medial right lung. 3. Near complete resolution of bibasilar infection/inflammation. 4. No thoracic adenopathy. 5. Aortic atherosclerosis (ICD10-I70.0), coronary artery atherosclerosis.  CT ABDOMEN AND PELVIS IMPRESSION 1. No acute process or evidence of metastatic disease in the abdomen or pelvis. 2. Mildly irregular hepatic capsule. Cannot exclude mild cirrhosis. Correlate with risk factors. 3. Similar right renal lesion, suspicious for a tiny renal cell carcinoma. Recommend attention on follow-up. Electronically Signed   By: Abigail Miyamoto M.D.   On: 12/20/2018 08:33    heparin lock flush 100 unit/mL    Date Action Dose Route User   11/28/2018 1218 Given 500 Units Intracatheter Arman Bogus, RN    heparin lock flush 100 unit/mL    Date Action Dose Route User   12/26/2018 1202 Given 500 Units Intracatheter Zola Button, RN    nivolumab (OPDIVO) 480 mg in sodium chloride 0.9 % 100 mL chemo infusion    Date Action Dose Route User   11/28/2018 1212 Rate/Dose Change (none) (none) Arman Bogus, RN   11/28/2018 1142 Rate/Dose Change (none) (none) Arman Bogus, RN   11/28/2018 1141 New Bag/Given 480 mg Intravenous Arman Bogus, RN    nivolumab (OPDIVO) 480 mg in sodium chloride 0.9 % 100 mL chemo infusion    Date Action Dose Route User   12/26/2018 1153 Rate/Dose Verify (none) (none) Zola Button, RN   12/26/2018 1125 Rate/Dose Change (none) (none) Zola Button, RN   12/26/2018 1125 New Bag/Given 480 mg Intravenous Zola Button, RN    0.9 %  sodium chloride infusion    Date Action Dose Route User   11/28/2018 1216 Rate/Dose Change (none) (none) Arman Bogus, RN   11/28/2018 1212 Rate/Dose Change (none) (none) Arman Bogus, RN   11/28/2018 1102 Rate/Dose Verify (none) (none) Arman Bogus, RN   11/28/2018  1057 New Bag/Given (none) Intravenous Arman Bogus, RN    0.9 %  sodium chloride infusion    Date Action Dose Route User   12/26/2018 1158 Rate/Dose Change (none) (none) Zola Button, RN   12/26/2018 1058 Rate/Dose Verify (none) (none) Zola Button, RN   12/26/2018 1054 New Bag/Given (none) Intravenous Zola Button, RN    sodium chloride flush (NS) 0.9 % injection 10 mL    Date Action Dose Route User   11/28/[phone] Given 10 mL Intracatheter Marvel Plan, Jasmin N, LPN    sodium chloride flush (NS) 0.9 % injection 10 mL    Date Action Dose Route User   11/28/2018 1218 Given 10 mL Intracatheter Arman Bogus, RN    sodium chloride flush (NS) 0.9 % injection 10 mL    Date Action Dose Route User   12/26/2018 0953 Given 10 mL Intracatheter Quentin Cornwall, Chasidy H, LPN    sodium chloride flush (NS) 0.9 % injection 10 mL    Date Action Dose Route User   12/26/2018 1202 Given 10 mL Intracatheter Zola Button, RN      PFT Results Latest Ref Rng & Units 10/06/2013  FVC-Pre L 1.95  FVC-Predicted Pre % 81  FVC-Post L 1.99  FVC-Predicted Post % 82  Pre FEV1/FVC % % 76  Post FEV1/FCV % % 83  FEV1-Pre L 1.47  FEV1-Predicted Pre % 81  FEV1-Post L 1.64  DLCO UNC% % 79  DLCO COR %Predicted % 93  TLC L 3.76  TLC % Predicted % 87  RV % Predicted % 77    No results found for: NITRICOXIDE      Assessment & Plan:   No problem-specific Assessment & Plan notes found for this encounter.  Rexene Edison, NP

## 2019-01-10 DIAGNOSIS — J9690 Respiratory failure, unspecified, unspecified whether with hypoxia or hypercapnia: Secondary | ICD-10-CM | POA: Insufficient documentation

## 2019-01-10 DIAGNOSIS — M7541 Impingement syndrome of right shoulder: Secondary | ICD-10-CM | POA: Diagnosis not present

## 2019-01-10 NOTE — Assessment & Plan Note (Signed)
Enlarging right-sided lung mass.  Followed by oncology currently undergoing immunotherapy. Patient continue follow-up with oncology.

## 2019-01-10 NOTE — Assessment & Plan Note (Signed)
New onset exertional hypoxemia.  Suspect is related to significant enlarging right-sided lung cancer. Recent CT chest showed enlarging right lung mass.  There was no evidence of central PE. Scans were reviewed with Dr. Melvyn Novas.  For now we will begin oxygen with activity.  Check an overnight oximetry test on room air. Close follow-up  Plan  Patient Instructions  Continue on Asmanex 2 puffs Twice daily.  May try Albuterol Inhaler 2 puffs every 6hr , before walking or heavy activity (try to limit to twice daily) , see if this helps.  Begin Oxygen 2l/m with activity ,  Set up overnight oximetry test on room air  Order for POC .  Follow up for virtual video visit in 4 weeks and As needed   Please contact office for sooner follow up if symptoms do not improve or worsen or seek emergency care

## 2019-01-10 NOTE — Assessment & Plan Note (Signed)
Progressive dyspnea with exertion questionable etiology.  Suspect this is multifactorial and patient with underlying progressive lung cancer undergoing active therapy with immunotherapy patient has significant involvement of the right lung with enlarging right lung mass over the last 5 months.  And now with new onset desaturation/hypoxia with activity.  Most recent CT chest 3 weeks ago showed enlarging mass.  No central PE.  Exam is unrevealing.  If patient symptoms persist or do not improve with oxygen will consider a repeat CT chest PE protocol.  Do not feel that d-dimer would be of much benefit as most likely will be elevated with her multiple comorbidities including age and underlying cancer.  Patient is however hypercoagulable with underlying malignancy.  So need to consider VT as a possible differential diagnosis.  No evidence of volume overload on exam.  She has no orthopnea.  Weights have been steady.  For now will begin oxygen with activity.  Follow closely.  Recent lab work was unrevealing no significant anemia or elevated white count/low wbc  Will set up for ONO    Plan  Patient Instructions  Continue on Asmanex 2 puffs Twice daily.  May try Albuterol Inhaler 2 puffs every 6hr , before walking or heavy activity (try to limit to twice daily) , see if this helps.  Begin Oxygen 2l/m with activity ,  Set up overnight oximetry test on room air  Order for POC .  Follow up for virtual video visit in 4 weeks and As needed   Please contact office for sooner follow up if symptoms do not improve or worsen or seek emergency care

## 2019-01-11 NOTE — Progress Notes (Signed)
Complex case, agree with plans for ONO and considering V/Q or CTA to assess for VTE

## 2019-01-14 ENCOUNTER — Telehealth: Payer: Self-pay | Admitting: Adult Health

## 2019-01-14 NOTE — Telephone Encounter (Signed)
Spoke with [place]  Because [facility] has already done patient's ONO, we cannot send order to another [place] had alternative number @ [phone], we called and spoke with Hassan Rowan who assured [place] that patient's O2 would be delivered today  TP is aware Called spoke with patient, informed her of the above and asked her to call [facility] if she does not hear from them by [phone] and if she needs further assistance from [place] to please call before 1700.  Patient voiced her understanding.  Will leaved message open to follow back up with patient tomorrow morning

## 2019-01-14 NOTE — Telephone Encounter (Signed)
Called and spoke with Patient.  Patient stated she had a ONO Thursday, 01/09/19, and someone came and picked it up 01/10/19, Friday. Patient stated she has not received O2 at this time, and the person dropping off ONO did not know anything about O2. Patient was unsure of what DME was used.  Per O2 order, DME was [employer].   [facility]ed [facility], she is checking with staff to see if ONO was done with them and for ONO results. Barnetta Chapel is to call back.

## 2019-01-14 NOTE — Telephone Encounter (Signed)
Spoke with patient - verified that she has received NO HOME O2  Per TP this needs to be done TODAY Advised patient will call [facility] again  Lennice Sites with [facility] again - no answer Per TP: please inform PCCs that this needs to be sent to another DME company and needs to be delivered TODAY

## 2019-01-14 NOTE — Telephone Encounter (Signed)
When Kimberly [name] returns call we need to find out why patient has not received any home O2 Per the referral notes, [facility] does not have any POCs in stock but patient SHOULD HAVE received O2 in the meantime Thanks!

## 2019-01-15 DIAGNOSIS — C3490 Malignant neoplasm of unspecified part of unspecified bronchus or lung: Secondary | ICD-10-CM | POA: Diagnosis not present

## 2019-01-15 DIAGNOSIS — J9611 Chronic respiratory failure with hypoxia: Secondary | ICD-10-CM | POA: Diagnosis not present

## 2019-01-15 DIAGNOSIS — M25511 Pain in right shoulder: Secondary | ICD-10-CM | POA: Diagnosis not present

## 2019-01-15 DIAGNOSIS — M7541 Impingement syndrome of right shoulder: Secondary | ICD-10-CM | POA: Diagnosis not present

## 2019-01-15 NOTE — Telephone Encounter (Signed)
Called [facility] spoke with Anderson Malta: who was not familiar with what was going on so I was placed on hold so that she could get [facility] who actually processed referral.  Adela Lank was on a call therefore, a message was taken to call the office back. We just need to make sure that patient is going to get her 02.

## 2019-01-15 NOTE — Telephone Encounter (Signed)
We need to call them ASAP and see what the problem is with their service this is unacceptable and they need to fix it ASAP .   Please contact office for sooner follow up if symptoms do not improve or worsen or seek emergency care

## 2019-01-15 NOTE — Telephone Encounter (Signed)
Spoke with patient. She stated that she received her O2 yesterday night from [place]. Patient was able to use it for a few hours until it broke. She called [facility] and they were to bring a temporary supply until the office opened back up this morning. She stated that she informed [facility] that she has a doctor's appt this afternoon and will need her O2 before then. They advised her that they would be back out this morning.   Advised patient if she has not heard anything this morning from [place], to give our office a call back ASAP. She verbalized understanding.   Will route to TP so she is aware that patient did receive her O2.

## 2019-01-15 NOTE — Telephone Encounter (Signed)
Thank you for checking with her.

## 2019-01-15 NOTE — Telephone Encounter (Signed)
TP this is FYI:  Pt was set with POC oxylife and a stationary unit. On last night she was having trouble with POC in which she should have been using stationary unit. Pt was pushing the wrong buttons on POC, the unit was not broken. She could not get it to come on. [facility] will go back out to complete her full set up on today, with the rest of her tanks.

## 2019-01-16 NOTE — Telephone Encounter (Signed)
Please call and check that this is working and she is doing okay   Make sure not further desats ?

## 2019-01-20 NOTE — Telephone Encounter (Signed)
Called and spoke with patient regarding TP concerns and recommendations Pt advised that she is doing better, and feeling well overall since last week She appreciated the f/u phone call, and advised that her SATS are doing good. She expressed and verbalized that all was okay with her O2 over the weekend and today. Nothing further needed at this time  Routing message to [place] for [facility]

## 2019-01-23 ENCOUNTER — Other Ambulatory Visit: Payer: Self-pay

## 2019-01-23 ENCOUNTER — Encounter: Payer: Self-pay | Admitting: Internal Medicine

## 2019-01-23 ENCOUNTER — Inpatient Hospital Stay: Payer: Medicare Other

## 2019-01-23 ENCOUNTER — Inpatient Hospital Stay (HOSPITAL_BASED_OUTPATIENT_CLINIC_OR_DEPARTMENT_OTHER): Payer: Medicare Other | Admitting: Internal Medicine

## 2019-01-23 ENCOUNTER — Inpatient Hospital Stay: Payer: Medicare Other | Attending: Internal Medicine

## 2019-01-23 VITALS — BP 145/86 | HR 84 | Temp 98.4°F | Resp 20 | Ht 59.0 in | Wt 146.3 lb

## 2019-01-23 DIAGNOSIS — Z5112 Encounter for antineoplastic immunotherapy: Secondary | ICD-10-CM | POA: Insufficient documentation

## 2019-01-23 DIAGNOSIS — E860 Dehydration: Secondary | ICD-10-CM

## 2019-01-23 DIAGNOSIS — Z9981 Dependence on supplemental oxygen: Secondary | ICD-10-CM

## 2019-01-23 DIAGNOSIS — R0602 Shortness of breath: Secondary | ICD-10-CM | POA: Diagnosis not present

## 2019-01-23 DIAGNOSIS — C342 Malignant neoplasm of middle lobe, bronchus or lung: Secondary | ICD-10-CM

## 2019-01-23 DIAGNOSIS — Z79899 Other long term (current) drug therapy: Secondary | ICD-10-CM | POA: Insufficient documentation

## 2019-01-23 DIAGNOSIS — R05 Cough: Secondary | ICD-10-CM

## 2019-01-23 DIAGNOSIS — I10 Essential (primary) hypertension: Secondary | ICD-10-CM

## 2019-01-23 DIAGNOSIS — C3490 Malignant neoplasm of unspecified part of unspecified bronchus or lung: Secondary | ICD-10-CM

## 2019-01-23 DIAGNOSIS — R5383 Other fatigue: Secondary | ICD-10-CM

## 2019-01-23 LAB — CBC WITH DIFFERENTIAL (CANCER CENTER ONLY)
Abs Immature Granulocytes: 0.04 10*3/uL (ref 0.00–0.07)
Basophils Absolute: 0.1 10*3/uL (ref 0.0–0.1)
Basophils Relative: 1 %
Eosinophils Absolute: 0.6 10*3/uL — ABNORMAL HIGH (ref 0.0–0.5)
Eosinophils Relative: 7 %
HCT: 33.7 % — ABNORMAL LOW (ref 36.0–46.0)
Hemoglobin: 11 g/dL — ABNORMAL LOW (ref 12.0–15.0)
Immature Granulocytes: 0 %
Lymphocytes Relative: 12 %
Lymphs Abs: 1.1 10*3/uL (ref 0.7–4.0)
MCH: 30.3 pg (ref 26.0–34.0)
MCHC: 32.6 g/dL (ref 30.0–36.0)
MCV: 92.8 fL (ref 80.0–100.0)
Monocytes Absolute: 0.6 10*3/uL (ref 0.1–1.0)
Monocytes Relative: 6 %
Neutro Abs: 6.9 10*3/uL (ref 1.7–7.7)
Neutrophils Relative %: 74 %
Platelet Count: 302 10*3/uL (ref 150–400)
RBC: 3.63 MIL/uL — ABNORMAL LOW (ref 3.87–5.11)
RDW: 13.2 % (ref 11.5–15.5)
WBC Count: 9.3 10*3/uL (ref 4.0–10.5)
nRBC: 0 % (ref 0.0–0.2)

## 2019-01-23 LAB — CMP (CANCER CENTER ONLY)
ALT: 11 U/L (ref 0–44)
AST: 14 U/L — ABNORMAL LOW (ref 15–41)
Albumin: 3.4 g/dL — ABNORMAL LOW (ref 3.5–5.0)
Alkaline Phosphatase: 142 U/L — ABNORMAL HIGH (ref 38–126)
Anion gap: 10 (ref 5–15)
BUN: 19 mg/dL (ref 8–23)
CO2: 30 mmol/L (ref 22–32)
Calcium: 9.4 mg/dL (ref 8.9–10.3)
Chloride: 99 mmol/L (ref 98–111)
Creatinine: 1.06 mg/dL — ABNORMAL HIGH (ref 0.44–1.00)
GFR, Est AFR Am: 59 mL/min — ABNORMAL LOW (ref >60)
GFR, Estimated: 51 mL/min — ABNORMAL LOW (ref >60)
Glucose, Bld: 101 mg/dL — ABNORMAL HIGH (ref 70–99)
Potassium: 3.4 mmol/L — ABNORMAL LOW (ref 3.5–5.1)
Sodium: 139 mmol/L (ref 135–145)
Total Bilirubin: 0.4 mg/dL (ref 0.3–1.2)
Total Protein: 7 g/dL (ref 6.5–8.1)

## 2019-01-23 LAB — TSH: TSH: 1.872 u[IU]/mL (ref 0.308–3.960)

## 2019-01-23 MED ORDER — SODIUM CHLORIDE 0.9% FLUSH
10.0000 mL | INTRAVENOUS | Status: DC | PRN
  Administered 2019-01-23: 10 mL
  Filled 2019-01-23: qty 10

## 2019-01-23 MED ORDER — HEPARIN SOD (PORK) LOCK FLUSH 100 UNIT/ML IV SOLN
500.0000 [IU] | Freq: Once | INTRAVENOUS | Status: AC | PRN
  Administered 2019-01-23: 500 [IU]
  Filled 2019-01-23: qty 5

## 2019-01-23 MED ORDER — SODIUM CHLORIDE 0.9% FLUSH
10.0000 mL | INTRAVENOUS | Status: DC | PRN
  Administered 2019-01-23: 10:00:00 10 mL
  Filled 2019-01-23: qty 10

## 2019-01-23 MED ORDER — SODIUM CHLORIDE 0.9 % IV SOLN
Freq: Once | INTRAVENOUS | Status: AC
  Administered 2019-01-23: 11:00:00 via INTRAVENOUS
  Filled 2019-01-23: qty 250

## 2019-01-23 MED ORDER — SODIUM CHLORIDE 0.9 % IV SOLN
480.0000 mg | Freq: Once | INTRAVENOUS | Status: AC
  Administered 2019-01-23: 480 mg via INTRAVENOUS
  Filled 2019-01-23: qty 48

## 2019-01-23 NOTE — Progress Notes (Signed)
[facility] [phone]   [phone] [place], MD [address] [place] [address]  DIAGNOSIS: Recurrent non-small cell lung cancer, squamous cell carcinoma initially diagnosed as unresectable a stage IIIa in February 2015.  PRIOR THERAPY: 1) Status post exploratory right VATS with mediastinal biopsy under the care of Dr. Roxan Hockey on 11/13/2013. The tumor was found to be unresectable at that time.[name] 2) Concurrent chemoradiation with weekly carboplatin for AUC of 2 and paclitaxel 45 mg/M2, status post 6 cycles with partial response. First cycle was given on 12/01/2013. 3) Consolidation chemotherapy with carboplatin for AUC of 5 and paclitaxel 175 mg/M2 every 3 weeks with Neulasta support. First dose 02/23/2014. Status post 3 cycles with partial response. 4) Systemic chemotherapy with carboplatin for AUC of 5 and paclitaxel 175 MG/M2 every 3 weeks with Neulasta support. Status post 6 cycles. First dose was given on 08/14/2016.  Last dose was giving 12/11/2016 with partial response.  CURRENT THERAPY: Second line treatment with immunotherapy with Nivolumab 480 mg IV every 4 weeks, first dose Jan 25, 2018.  Status post 14 cycles.  INTERVAL HISTORY: Kimberly [name] 77 y.o. female returns to the clinic today for follow-up visit.  The patient is feeling fine today with no concerning complaints except for the baseline shortness of breath and she is currently on home oxygen by Dr. Lake Bells.  The patient denied having any chest pain, cough or hemoptysis.  She denied having any fever.  She has no sore throat.  She denied having any nausea, vomiting, diarrhea or constipation.  She has no significant weight loss or night sweats.  She is here today for evaluation before starting cycle #15 of her treatment.  MEDICAL HISTORY: Past Medical History:  Diagnosis Date  . Allergic asthma without acute exacerbation or status  asthmaticus   . Aortic insufficiency   . Arthritis   . Atrial fibrillation ([place])   . Back pain 08/14/2016  . Bronchitis   . Cough    dry, endobronchial mass  . Dyslipidemia   . Family history of adverse reaction to anesthesia    pts son also experiences N/V  . GERD (gastroesophageal reflux disease)   . Heart murmur   . History of blood transfusion   . History of bronchitis   . History of chemotherapy   . History of nuclear stress test 02/26/2006   exercise myoview; normal pattern of perfusion; low risk scan   . Hypertension   . Hypothyroidism   . Mitral insufficiency   . Pneumonia   . PONV (postoperative nausea and vomiting)   . Radiation 12/01/13-01/08/14   50.4 gray to right central chest  . Renal mass, left 04/12/2017  . Shortness of breath    with exertion  . Squamous cell carcinoma of lung ([place])   . Syncope    "states she has passed out a few times. dr is trying to find cause.last time when geeting ready to go home after video bronch/bx  . Thyroid disease     ALLERGIES:  is allergic to lactose intolerance (gi); amiodarone; augmentin [amoxicillin-pot clavulanate]; milk-related compounds; and oxycontin  [oxycodone hcl].  MEDICATIONS:  Current Outpatient Medications  Medication Sig Dispense Refill  . albuterol (VENTOLIN HFA) 108 (90 Base) MCG/ACT inhaler Inhale 1 puff into the lungs every 6 (six) hours as needed (before walking or heavy activity [try to limit to twice daily]). 1 Inhaler 5  . amLODipine (NORVASC) 5 MG  tablet Take 5 mg by mouth daily.  3  . atorvastatin (LIPITOR) 20 MG tablet Take 20 mg by mouth every morning.     . chlorthalidone (HYGROTON) 25 MG tablet TAKE 1 TABLET BY MOUTH  DAILY 90 tablet 1  . fluticasone (FLONASE) 50 MCG/ACT nasal spray Place 2 sprays into both nostrils daily.    [name] KLOR-CON 8 MEQ tablet Take 8 mEq every other day by mouth.  3  . levothyroxine (SYNTHROID, LEVOTHROID) 100 MCG tablet Take 100 mcg daily by mouth.    . lidocaine-prilocaine  (EMLA) cream Apply 1 application topically as needed. (Patient taking differently: Apply 1 application topically as needed (for port access). ) 30 g 0  . metoprolol succinate (TOPROL-XL) 25 MG 24 hr tablet Take 25 mg by mouth daily.    . Mometasone Furoate (ASMANEX HFA) 100 MCG/ACT AERO Inhale 2 puffs into the lungs 2 (two) times daily. 39 g 3  . montelukast (SINGULAIR) 10 MG tablet Take 10 mg by mouth at bedtime.     . Multiple Vitamins-Iron (MULTIVITAMIN/IRON PO) Take 1 tablet by mouth daily with breakfast.     . pantoprazole (PROTONIX) 40 MG tablet TAKE 1 TABLET BY MOUTH TWO  TIMES DAILY 120 tablet 0  . prochlorperazine (COMPAZINE) 10 MG tablet Take 1 tablet (10 mg total) by mouth every 6 (six) hours as needed for nausea or vomiting. 30 tablet 0  . traMADol (ULTRAM) 50 MG tablet Take 50 mg every 8 (eight) hours as needed by mouth. Take 1/2 tablet  3   No current facility-administered medications for this visit.     SURGICAL HISTORY:  Past Surgical History:  Procedure Laterality Date  . CARDIAC CATHETERIZATION  07/08/2008   normal coronaries  . CARPAL TUNNEL RELEASE Right 1988  . COLONOSCOPY N/A 02/11/2016   Procedure: COLONOSCOPY;  Surgeon: Carol Ada, MD;  Location: [facility];  Service: Endoscopy;  Laterality: N/A;  . DILATION AND CURETTAGE OF UTERUS    . ESOPHAGOGASTRODUODENOSCOPY N/A 02/08/2017   Procedure: ESOPHAGOGASTRODUODENOSCOPY (EGD);  Surgeon: Carol Ada, MD;  Location: [facility];  Service: Endoscopy;  Laterality: N/A;  . EYE SURGERY Bilateral   . H/O MET Test w/PFT  04/02/2012   low risk; peak VO2 77% predicted  . IR GENERIC HISTORICAL  09/12/2016   IR FLUORO GUIDE PORT INSERTION RIGHT 09/12/2016 [name], MD [facility]  . IR GENERIC HISTORICAL  09/12/2016   IR US GUIDE VASC ACCESS RIGHT 09/12/2016 [name], MD [facility]  . JOINT REPLACEMENT  2003   thumb rt  . KNEE ARTHROSCOPY  12   rt meniscus  . MEDIASTINOSCOPY N/A 10/30/2013   Procedure:  MEDIASTINOSCOPY;  Surgeon: Melrose Nakayama, MD;  Location: [facility];  Service: Thoracic;  Laterality: N/A;  . ROTATOR CUFF REPAIR  2006   ? side  . THYROIDECTOMY  1973  . TRANSTHORACIC ECHOCARDIOGRAM  09/25/2012   EF 55-60%; mild LVH & mild concentric hypertrophy; mild AV regurg; RV systolic pressure increase consistent with mild pulm HTN  . VIDEO ASSISTED THORACOSCOPY (VATS)/ LOBECTOMY Right 11/13/2013   Procedure: VIDEO ASSISTED THORACOSCOPY (VATS) with mediastinal  biopsies;  Surgeon: Melrose Nakayama, MD;  Location: [facility];  Service: Thoracic;  Laterality: Right;  RIGHT VATS,mediastinal biopsies  . VIDEO BRONCHOSCOPY Bilateral 09/25/2013   Procedure: VIDEO BRONCHOSCOPY WITHOUT FLUORO;  Surgeon: Tanda Rockers, MD;  Location: [facility];  Service: Cardiopulmonary;  Laterality: Bilateral;  . VIDEO BRONCHOSCOPY WITH ENDOBRONCHIAL ULTRASOUND N/A 10/30/2013   Procedure: VIDEO BRONCHOSCOPY WITH  ENDOBRONCHIAL ULTRASOUND;  Surgeon: Melrose Nakayama, MD;  Location: [facility];  Service: Thoracic;  Laterality: N/A;    REVIEW OF SYSTEMS:  A comprehensive review of systems was negative except for: Respiratory: positive for dyspnea on exertion   PHYSICAL EXAMINATION: General appearance: alert, cooperative and no distress Head: Normocephalic, without obvious abnormality, atraumatic Neck: no adenopathy, no JVD, supple, symmetrical, trachea midline and thyroid not enlarged, symmetric, no tenderness/mass/nodules Lymph nodes: Cervical, supraclavicular, and axillary nodes normal. Resp: clear to auscultation bilaterally Back: symmetric, no curvature. ROM normal. No CVA tenderness. Cardio: regular rate and rhythm, S1, S2 normal, no murmur, click, rub or gallop GI: soft, non-tender; bowel sounds normal; no masses,  no organomegaly Extremities: extremities normal, atraumatic, no cyanosis or edema  ECOG PERFORMANCE STATUS: 1 - Symptomatic but completely ambulatory  Blood pressure (!) 145/86, pulse 84,  temperature 98.4 F (36.9 C), temperature source Oral, resp. rate 20, height '4\' 11"'$  (1.499 m), weight 146 lb 4.8 oz (66.4 kg), SpO2 100 %.  LABORATORY DATA: Lab Results  Component Value Date   WBC 9.3 01/23/2019   HGB 11.0 (L) 01/23/2019   HCT 33.7 (L) 01/23/2019   MCV 92.8 01/23/2019   PLT 302 01/23/2019      Chemistry      Component Value Date/Time   NA 134 (L) 12/26/2018 0945   NA 140 07/12/2017 1212   K 3.3 (L) 12/26/2018 0945   K 3.5 07/12/2017 1212   CL 96 (L) 12/26/2018 0945   CO2 27 12/26/2018 0945   CO2 27 07/12/2017 1212   BUN 21 12/26/2018 0945   BUN 27.5 (H) 07/12/2017 1212   CREATININE 0.98 12/26/2018 0945   CREATININE 1.0 07/12/2017 1212      Component Value Date/Time   CALCIUM 9.4 12/26/2018 0945   CALCIUM 10.0 07/12/2017 1212   ALKPHOS 167 (H) 12/26/2018 0945   ALKPHOS 119 07/12/2017 1212   AST 12 (L) 12/26/2018 0945   AST 16 07/12/2017 1212   ALT 7 12/26/2018 0945   ALT 15 07/12/2017 1212   BILITOT 0.4 12/26/2018 0945   BILITOT 0.48 07/12/2017 1212       RADIOGRAPHIC STUDIES: No results found.  ASSESSMENT AND PLAN: This is a very pleasant 78 years old white female with recurrent non-small cell lung cancer, squamous cell carcinoma. The patient completed 6 cycles of systemic chemotherapy with carboplatin and paclitaxel and tolerated her treatment well except for fatigue as well as peripheral neuropathy. The patient has been in observation.  She continues to have persistent dry cough and shortness of breath. She had evidence for disease progression on restaging scan. The patient was started on treatment with second line immunotherapy with Nivolumab 480 mg IV every 4 weeks status post 14 cycles.  The patient continues to tolerate her treatment well with no concerning adverse effects. I recommended for her to proceed with cycle #15 today as planned. I will see her back for follow-up visit in 4 weeks for evaluation after repeating CT scan of the chest,  abdomen and pelvis for restaging of her disease. She was advised to call immediately if she has any concerning symptoms in the interval. The patient voices understanding of current disease status and treatment options and is in agreement with the current care plan. All questions were answered. The patient knows to call the clinic with any problems, questions or concerns. We can certainly see the patient much sooner if necessary.   Disclaimer: This note was dictated with voice recognition software. Similar sounding  words can inadvertently be transcribed and may not be corrected upon review.

## 2019-01-29 DIAGNOSIS — H2512 Age-related nuclear cataract, left eye: Secondary | ICD-10-CM | POA: Diagnosis not present

## 2019-02-06 ENCOUNTER — Telehealth: Payer: Medicare Other | Admitting: Adult Health

## 2019-02-06 ENCOUNTER — Telehealth: Payer: Self-pay | Admitting: Adult Health

## 2019-02-06 NOTE — Telephone Encounter (Signed)
Spoke with patient. She is aware that [facility] will be contacting her today for her to check out the smaller O2 tanks. She is ok with this. Advised her to contact us if she has any other concerns, she verbalized understanding.   Nothing further needed at time of call.

## 2019-02-06 NOTE — Telephone Encounter (Signed)
Patient was scheduled for a MyChart Video Visit this morning with TP but never checked in. Called patient to check on her. She stated that she was doing very well since her last visit. She is still using 2L of O2 from [place]. Denied any breathing complaints.   The only complaint that she had was the fact that her current POC is too heavy. From the description, it sounds like she has a small tank that she is having to lift and pull around. Patient has had multiple back surgeries therefore can not lift and pull on heavy objects. She is also upset that she feels very limited with the tank. She normally takes her dogs out for walks but tried this once and almost fell due to one of the dogs getting tangled in the nasal canula tubing.   Advised her that I would reach out to [facility] to see if they had any smaller tanks or POC for her. She verbalized understanding.   Spoke with Stanton Kidney at [facility]. She confirmed that the patient does have the oxygen tank in a pull/push cart. Explained to her about the patient's back issues. She suggested the M6 tanks. She stated that they can bring a tank to the patient and see if this would be an able. Also, the POC is on a manufacturer back order. They are not sure of when they will receive them.

## 2019-02-07 ENCOUNTER — Other Ambulatory Visit: Payer: Self-pay | Admitting: Internal Medicine

## 2019-02-14 ENCOUNTER — Other Ambulatory Visit: Payer: Self-pay | Admitting: Pulmonary Disease

## 2019-02-14 ENCOUNTER — Other Ambulatory Visit: Payer: Self-pay | Admitting: Internal Medicine

## 2019-02-15 DIAGNOSIS — C3490 Malignant neoplasm of unspecified part of unspecified bronchus or lung: Secondary | ICD-10-CM | POA: Diagnosis not present

## 2019-02-15 DIAGNOSIS — J9611 Chronic respiratory failure with hypoxia: Secondary | ICD-10-CM | POA: Diagnosis not present

## 2019-02-17 ENCOUNTER — Telehealth: Payer: Self-pay | Admitting: *Deleted

## 2019-02-17 NOTE — Telephone Encounter (Signed)
Received vm message from patient requesting that Dr. Julien Nordmann call her daugher during her next appt (02/20/19) as recent scans will be reviewed. Daughter is Ortencia Kick   [phone]

## 2019-02-18 ENCOUNTER — Encounter (HOSPITAL_COMMUNITY): Payer: Self-pay

## 2019-02-18 ENCOUNTER — Other Ambulatory Visit: Payer: Self-pay

## 2019-02-18 ENCOUNTER — Ambulatory Visit (HOSPITAL_COMMUNITY)
Admission: RE | Admit: 2019-02-18 | Discharge: 2019-02-18 | Disposition: A | Discharge status: Home / Self Care | Payer: Medicare Other | Source: Ambulatory Visit | Attending: Internal Medicine | Admitting: Internal Medicine

## 2019-02-18 DIAGNOSIS — C3490 Malignant neoplasm of unspecified part of unspecified bronchus or lung: Secondary | ICD-10-CM

## 2019-02-18 DIAGNOSIS — N289 Disorder of kidney and ureter, unspecified: Secondary | ICD-10-CM | POA: Diagnosis not present

## 2019-02-18 DIAGNOSIS — R918 Other nonspecific abnormal finding of lung field: Secondary | ICD-10-CM | POA: Diagnosis not present

## 2019-02-18 MED ORDER — HEPARIN SOD (PORK) LOCK FLUSH 100 UNIT/ML IV SOLN
INTRAVENOUS | Status: AC
  Administered 2019-02-18: 500 [IU]
  Filled 2019-02-18: qty 5

## 2019-02-18 MED ORDER — IOHEXOL 300 MG/ML  SOLN
100.0000 mL | Freq: Once | INTRAMUSCULAR | Status: AC | PRN
  Administered 2019-02-18: 100 mL via INTRAVENOUS

## 2019-02-18 MED ORDER — HEPARIN SOD (PORK) LOCK FLUSH 100 UNIT/ML IV SOLN: 500.0000 [IU] | Freq: Once | INTRAVENOUS | Status: DC

## 2019-02-18 MED ORDER — SODIUM CHLORIDE (PF) 0.9 % IJ SOLN
INTRAMUSCULAR | Status: AC
  Filled 2019-02-18: qty 50

## 2019-02-20 ENCOUNTER — Inpatient Hospital Stay: Payer: Medicare Other | Attending: Internal Medicine

## 2019-02-20 ENCOUNTER — Other Ambulatory Visit: Payer: Self-pay

## 2019-02-20 ENCOUNTER — Inpatient Hospital Stay: Payer: Medicare Other

## 2019-02-20 ENCOUNTER — Inpatient Hospital Stay (HOSPITAL_BASED_OUTPATIENT_CLINIC_OR_DEPARTMENT_OTHER): Payer: Medicare Other | Admitting: Internal Medicine

## 2019-02-20 ENCOUNTER — Encounter: Payer: Self-pay | Admitting: Internal Medicine

## 2019-02-20 VITALS — BP 159/80 | HR 82 | Temp 98.2°F | Resp 20 | Ht 59.0 in | Wt 145.2 lb

## 2019-02-20 DIAGNOSIS — D6481 Anemia due to antineoplastic chemotherapy: Secondary | ICD-10-CM

## 2019-02-20 DIAGNOSIS — C342 Malignant neoplasm of middle lobe, bronchus or lung: Secondary | ICD-10-CM | POA: Insufficient documentation

## 2019-02-20 DIAGNOSIS — C3490 Malignant neoplasm of unspecified part of unspecified bronchus or lung: Secondary | ICD-10-CM

## 2019-02-20 DIAGNOSIS — R0602 Shortness of breath: Secondary | ICD-10-CM

## 2019-02-20 DIAGNOSIS — R42 Dizziness and giddiness: Secondary | ICD-10-CM | POA: Diagnosis not present

## 2019-02-20 DIAGNOSIS — Z79899 Other long term (current) drug therapy: Secondary | ICD-10-CM | POA: Diagnosis not present

## 2019-02-20 DIAGNOSIS — I10 Essential (primary) hypertension: Secondary | ICD-10-CM

## 2019-02-20 DIAGNOSIS — Z5112 Encounter for antineoplastic immunotherapy: Secondary | ICD-10-CM | POA: Diagnosis not present

## 2019-02-20 DIAGNOSIS — M545 Low back pain: Secondary | ICD-10-CM | POA: Diagnosis not present

## 2019-02-20 DIAGNOSIS — R5383 Other fatigue: Secondary | ICD-10-CM

## 2019-02-20 DIAGNOSIS — Z9981 Dependence on supplemental oxygen: Secondary | ICD-10-CM

## 2019-02-20 DIAGNOSIS — R531 Weakness: Secondary | ICD-10-CM

## 2019-02-20 DIAGNOSIS — R112 Nausea with vomiting, unspecified: Secondary | ICD-10-CM | POA: Diagnosis not present

## 2019-02-20 DIAGNOSIS — R05 Cough: Secondary | ICD-10-CM

## 2019-02-20 DIAGNOSIS — E860 Dehydration: Secondary | ICD-10-CM

## 2019-02-20 LAB — CMP (CANCER CENTER ONLY)
ALT: 13 U/L (ref 0–44)
AST: 17 U/L (ref 15–41)
Albumin: 3.6 g/dL (ref 3.5–5.0)
Alkaline Phosphatase: 156 U/L — ABNORMAL HIGH (ref 38–126)
Anion gap: 13 (ref 5–15)
BUN: 14 mg/dL (ref 8–23)
CO2: 29 mmol/L (ref 22–32)
Calcium: 9.4 mg/dL (ref 8.9–10.3)
Chloride: 87 mmol/L — ABNORMAL LOW (ref 98–111)
Creatinine: 0.93 mg/dL (ref 0.44–1.00)
GFR, Est AFR Am: >60 mL/min (ref >60)
GFR, Estimated: >60 mL/min (ref >60)
Glucose, Bld: 95 mg/dL (ref 70–99)
Potassium: 3.6 mmol/L (ref 3.5–5.1)
Sodium: 129 mmol/L — ABNORMAL LOW (ref 135–145)
Total Bilirubin: 0.6 mg/dL (ref 0.3–1.2)
Total Protein: 7.2 g/dL (ref 6.5–8.1)

## 2019-02-20 LAB — CBC WITH DIFFERENTIAL (CANCER CENTER ONLY)
Abs Immature Granulocytes: 0.03 10*3/uL (ref 0.00–0.07)
Basophils Absolute: 0.1 10*3/uL (ref 0.0–0.1)
Basophils Relative: 1 %
Eosinophils Absolute: 0.4 10*3/uL (ref 0.0–0.5)
Eosinophils Relative: 4 %
HCT: 33 % — ABNORMAL LOW (ref 36.0–46.0)
Hemoglobin: 11.1 g/dL — ABNORMAL LOW (ref 12.0–15.0)
Immature Granulocytes: 0 %
Lymphocytes Relative: 12 %
Lymphs Abs: 1.1 10*3/uL (ref 0.7–4.0)
MCH: 30.2 pg (ref 26.0–34.0)
MCHC: 33.6 g/dL (ref 30.0–36.0)
MCV: 89.9 fL (ref 80.0–100.0)
Monocytes Absolute: 0.7 10*3/uL (ref 0.1–1.0)
Monocytes Relative: 8 %
Neutro Abs: 7.1 10*3/uL (ref 1.7–7.7)
Neutrophils Relative %: 75 %
Platelet Count: 337 10*3/uL (ref 150–400)
RBC: 3.67 MIL/uL — ABNORMAL LOW (ref 3.87–5.11)
RDW: 12.8 % (ref 11.5–15.5)
WBC Count: 9.4 10*3/uL (ref 4.0–10.5)
nRBC: 0 % (ref 0.0–0.2)

## 2019-02-20 LAB — TSH: TSH: 4.559 u[IU]/mL — ABNORMAL HIGH (ref 0.308–3.960)

## 2019-02-20 MED ORDER — HEPARIN SOD (PORK) LOCK FLUSH 100 UNIT/ML IV SOLN
500.0000 [IU] | Freq: Once | INTRAVENOUS | Status: AC | PRN
  Administered 2019-02-20: 15:00:00 500 [IU]
  Filled 2019-02-20: qty 5

## 2019-02-20 MED ORDER — SODIUM CHLORIDE 0.9 % IV SOLN
Freq: Once | INTRAVENOUS | Status: AC
  Administered 2019-02-20: 13:00:00 via INTRAVENOUS
  Filled 2019-02-20: qty 250

## 2019-02-20 MED ORDER — SODIUM CHLORIDE 0.9% FLUSH
10.0000 mL | INTRAVENOUS | Status: DC | PRN
  Administered 2019-02-20: 10 mL
  Filled 2019-02-20: qty 10

## 2019-02-20 MED ORDER — SODIUM CHLORIDE 0.9 % IV SOLN
480.0000 mg | Freq: Once | INTRAVENOUS | Status: AC
  Administered 2019-02-20: 14:00:00 480 mg via INTRAVENOUS
  Filled 2019-02-20: qty 48

## 2019-02-20 NOTE — Patient Instructions (Signed)

## 2019-02-20 NOTE — Progress Notes (Signed)
[place] Telephone:([phone]) [phone]   Fax:([phone]) [place], MD [address] [place] [address]  DIAGNOSIS: Recurrent non-small cell lung cancer, squamous cell carcinoma initially diagnosed as unresectable a stage IIIa in February 2015.  PRIOR THERAPY: 1) Status post exploratory right VATS with mediastinal biopsy under the care of Dr. Roxan Hockey on 11/13/2013. The tumor was found to be unresectable at that time.[name] 2) Concurrent chemoradiation with weekly carboplatin for AUC of 2 and paclitaxel 45 mg/M2, status post 6 cycles with partial response. First cycle was given on 12/01/2013. 3) Consolidation chemotherapy with carboplatin for AUC of 5 and paclitaxel 175 mg/M2 every 3 weeks with Neulasta support. First dose 02/23/2014. Status post 3 cycles with partial response. 4) Systemic chemotherapy with carboplatin for AUC of 5 and paclitaxel 175 MG/M2 every 3 weeks with Neulasta support. Status post 6 cycles. First dose was given on 08/14/2016.  Last dose was giving 12/11/2016 with partial response.  CURRENT THERAPY: Second line treatment with immunotherapy with Nivolumab 480 mg IV every 4 weeks, first dose Jan 25, 2018.  Status post 14 cycles.  INTERVAL HISTORY: Kimberly [name] 78 y.o. female returns to the clinic today for follow-up visit.  Her daughter Kimberly [name] was available by phone during the visit.  The patient continues to complain of increasing fatigue and weakness as well as frequent episodes of nausea and vomiting of bile containing vomitus.  She also has occasional dizzy spells with change in position.  She has intermittent back pain and she is interested in proceeding with injection to her back.  She has no chest pain but continues to have shortness of breath and she is currently on home oxygen 2 L per minute with oxygen saturation 100%.  She denied having any chills.  She has no headache or visual changes.  She has  been tolerating her treatment with nivolumab fairly well.  The patient had repeat CT scan of the chest, abdomen and pelvis performed recently and she is here for evaluation and discussion of her scan results.  MEDICAL HISTORY: Past Medical History:  Diagnosis Date  . Allergic asthma without acute exacerbation or status asthmaticus   . Aortic insufficiency   . Arthritis   . Atrial fibrillation ([place])   . Back pain 08/14/2016  . Bronchitis   . Cough    dry, endobronchial mass  . Dyslipidemia   . Family history of adverse reaction to anesthesia    pts son also experiences N/V  . GERD (gastroesophageal reflux disease)   . Heart murmur   . History of blood transfusion   . History of bronchitis   . History of chemotherapy   . History of nuclear stress test 02/26/2006   exercise myoview; normal pattern of perfusion; low risk scan   . Hypertension   . Hypothyroidism   . Mitral insufficiency   . Pneumonia   . PONV (postoperative nausea and vomiting)   . Radiation 12/01/13-01/08/14   50.4 gray to right central chest  . Renal mass, left 04/12/2017  . Shortness of breath    with exertion  . Squamous cell carcinoma of lung ([place])   . Syncope    "states she has passed out a few times. dr is trying to find cause.last time when geeting ready to go home after video bronch/bx  . Thyroid disease     ALLERGIES:  is allergic to lactose intolerance (gi); amiodarone; augmentin [amoxicillin-pot clavulanate]; milk-related compounds;  and oxycontin  [oxycodone hcl].  MEDICATIONS:  Current Outpatient Medications  Medication Sig Dispense Refill  . albuterol (VENTOLIN HFA) 108 (90 Base) MCG/ACT inhaler Inhale 1 puff into the lungs every 6 (six) hours as needed (before walking or heavy activity [try to limit to twice daily]). 1 Inhaler 5  . amLODipine (NORVASC) 5 MG tablet TAKE 1 TABLET BY MOUTH EVERY DAY 90 tablet 3  . atorvastatin (LIPITOR) 20 MG tablet Take 20 mg by mouth every morning.     .  chlorthalidone (HYGROTON) 25 MG tablet TAKE 1 TABLET BY MOUTH  DAILY 90 tablet 2  . fluticasone (FLONASE) 50 MCG/ACT nasal spray Place 2 sprays into both nostrils daily.    [name] KLOR-CON 8 MEQ tablet Take 8 mEq every other day by mouth.  3  . levothyroxine (SYNTHROID, LEVOTHROID) 100 MCG tablet Take 100 mcg daily by mouth.    . lidocaine-prilocaine (EMLA) cream Apply 1 application topically as needed. (Patient taking differently: Apply 1 application topically as needed (for port access). ) 30 g 0  . metoprolol succinate (TOPROL-XL) 25 MG 24 hr tablet Take 25 mg by mouth daily.    . Mometasone Furoate (ASMANEX HFA) 100 MCG/ACT AERO Inhale 2 puffs into the lungs 2 (two) times daily. 39 g 3  . montelukast (SINGULAIR) 10 MG tablet Take 10 mg by mouth at bedtime.     . Multiple Vitamins-Iron (MULTIVITAMIN/IRON PO) Take 1 tablet by mouth daily with breakfast.     . pantoprazole (PROTONIX) 40 MG tablet TAKE 1 TABLET BY MOUTH  TWICE DAILY 120 tablet 0  . prochlorperazine (COMPAZINE) 10 MG tablet Take 1 tablet (10 mg total) by mouth every 6 (six) hours as needed for nausea or vomiting. 30 tablet 0  . traMADol (ULTRAM) 50 MG tablet Take 50 mg every 8 (eight) hours as needed by mouth. Take 1/2 tablet  3   No current facility-administered medications for this visit.     SURGICAL HISTORY:  Past Surgical History:  Procedure Laterality Date  . CARDIAC CATHETERIZATION  07/08/2008   normal coronaries  . CARPAL TUNNEL RELEASE Right 1988  . COLONOSCOPY N/A 02/11/2016   Procedure: COLONOSCOPY;  Surgeon: Carol Ada, MD;  Location: [facility];  Service: Endoscopy;  Laterality: N/A;  . DILATION AND CURETTAGE OF UTERUS    . ESOPHAGOGASTRODUODENOSCOPY N/A 02/08/2017   Procedure: ESOPHAGOGASTRODUODENOSCOPY (EGD);  Surgeon: Carol Ada, MD;  Location: [facility];  Service: Endoscopy;  Laterality: N/A;  . EYE SURGERY Bilateral   . H/O MET Test w/PFT  04/02/2012   low risk; peak VO2 77% predicted  . IR GENERIC  HISTORICAL  09/12/2016   IR FLUORO GUIDE PORT INSERTION RIGHT 09/12/2016 [name], MD [facility]  . IR GENERIC HISTORICAL  09/12/2016   IR US GUIDE VASC ACCESS RIGHT 09/12/2016 [name], MD [facility]  . JOINT REPLACEMENT  2003   thumb rt  . KNEE ARTHROSCOPY  12   rt meniscus  . MEDIASTINOSCOPY N/A 10/30/2013   Procedure: MEDIASTINOSCOPY;  Surgeon: Melrose Nakayama, MD;  Location: [facility];  Service: Thoracic;  Laterality: N/A;  . ROTATOR CUFF REPAIR  2006   ? side  . THYROIDECTOMY  1973  . TRANSTHORACIC ECHOCARDIOGRAM  09/25/2012   EF 55-60%; mild LVH & mild concentric hypertrophy; mild AV regurg; RV systolic pressure increase consistent with mild pulm HTN  . VIDEO ASSISTED THORACOSCOPY (VATS)/ LOBECTOMY Right 11/13/2013   Procedure: VIDEO ASSISTED THORACOSCOPY (VATS) with mediastinal  biopsies;  Surgeon: Melrose Nakayama,  MD;  Location: [facility];  Service: Thoracic;  Laterality: Right;  RIGHT VATS,mediastinal biopsies  . VIDEO BRONCHOSCOPY Bilateral 09/25/2013   Procedure: VIDEO BRONCHOSCOPY WITHOUT FLUORO;  Surgeon: Tanda Rockers, MD;  Location: [facility];  Service: Cardiopulmonary;  Laterality: Bilateral;  . VIDEO BRONCHOSCOPY WITH ENDOBRONCHIAL ULTRASOUND N/A 10/30/2013   Procedure: VIDEO BRONCHOSCOPY WITH ENDOBRONCHIAL ULTRASOUND;  Surgeon: Melrose Nakayama, MD;  Location: [facility];  Service: Thoracic;  Laterality: N/A;    REVIEW OF SYSTEMS:  Constitutional: positive for fatigue Eyes: negative Ears, nose, mouth, throat, and face: negative Respiratory: positive for dyspnea on exertion Cardiovascular: negative Gastrointestinal: positive for nausea Genitourinary:negative Integument/breast: negative Hematologic/lymphatic: negative Musculoskeletal:negative Neurological: negative Behavioral/Psych: negative Endocrine: negative Allergic/Immunologic: negative   PHYSICAL EXAMINATION: General appearance: alert, cooperative, fatigued and no distress Head:  Normocephalic, without obvious abnormality, atraumatic Neck: no adenopathy, no JVD, supple, symmetrical, trachea midline and thyroid not enlarged, symmetric, no tenderness/mass/nodules Lymph nodes: Cervical, supraclavicular, and axillary nodes normal. Resp: clear to auscultation bilaterally Back: symmetric, no curvature. ROM normal. No CVA tenderness. Cardio: regular rate and rhythm, S1, S2 normal, no murmur, click, rub or gallop GI: soft, non-tender; bowel sounds normal; no masses,  no organomegaly Extremities: extremities normal, atraumatic, no cyanosis or edema Neurologic: Alert and oriented X 3, normal strength and tone. Normal symmetric reflexes. Normal coordination and gait  ECOG PERFORMANCE STATUS: 1 - Symptomatic but completely ambulatory  Blood pressure (!) 159/80, pulse 82, temperature 98.2 F (36.8 C), temperature source Oral, resp. rate 20, height '4\' 11"'  (1.499 m), weight 145 lb 3.2 oz (65.9 kg), SpO2 100 %.  LABORATORY DATA: Lab Results  Component Value Date   WBC 9.4 02/20/2019   HGB 11.1 (L) 02/20/2019   HCT 33.0 (L) 02/20/2019   MCV 89.9 02/20/2019   PLT 337 02/20/2019      Chemistry      Component Value Date/Time   NA 139 01/23/2019 0934   NA 140 07/12/2017 1212   K 3.4 (L) 01/23/2019 0934   K 3.5 07/12/2017 1212   CL 99 01/23/2019 0934   CO2 30 01/23/2019 0934   CO2 27 07/12/2017 1212   BUN 19 01/23/2019 0934   BUN 27.5 (H) 07/12/2017 1212   CREATININE 1.06 (H) 01/23/2019 0934   CREATININE 1.0 07/12/2017 1212      Component Value Date/Time   CALCIUM 9.4 01/23/2019 0934   CALCIUM 10.0 07/12/2017 1212   ALKPHOS 142 (H) 01/23/2019 0934   ALKPHOS 119 07/12/2017 1212   AST 14 (L) 01/23/2019 0934   AST 16 07/12/2017 1212   ALT 11 01/23/2019 0934   ALT 15 07/12/2017 1212   BILITOT 0.4 01/23/2019 0934   BILITOT 0.48 07/12/2017 1212       RADIOGRAPHIC STUDIES: Ct Chest W Contrast  Result Date: 02/18/2019 CLINICAL DATA:  Right lung cancer diagnosed in  2015 with recurrence in 2018. Surgery, radiation therapy and chemotherapy. Chronic cough and shortness of breath. EXAM: CT CHEST, ABDOMEN, AND PELVIS WITH CONTRAST TECHNIQUE: Multidetector CT imaging of the chest, abdomen and pelvis was performed following the standard protocol during bolus administration of intravenous contrast. CONTRAST:  175m OMNIPAQUE IOHEXOL 300 MG/ML  SOLN COMPARISON:  12/19/2018 FINDINGS: CT CHEST FINDINGS Cardiovascular: Right Port-A-Cath tip at high right atrium. Normal heart size, without pericardial effusion. Multivessel coronary artery atherosclerosis. Aortic atherosclerosis. No central pulmonary embolism, on this non-dedicated study. Mediastinum/Nodes: Thyroidectomy. No supraclavicular adenopathy. No mediastinal or hilar adenopathy. Small periesophageal nodes are unchanged and not pathologic by size criteria. Lungs/Pleura: Trace  right pleural fluid is not significantly changed. Heterogeneous, likely centrally necrotic right middle lobe lung mass measures 5.3 x 4.4 cm and is similar to on the prior exam (when remeasured). Similar craniocaudal dimension of 2.9 cm. Calcified and noncalcified left lower lobe pulmonary nodules, including a noncalcified 3 mm nodule, are similar. Similar configuration of geographic consolidation involving the posteromedial right lung and anterior right lung base. Musculoskeletal: Lower thoracic spondylosis. CT ABDOMEN PELVIS FINDINGS Hepatobiliary: Irregular hepatic capsule again identified. No focal liver lesion. Normal gallbladder, without biliary ductal dilatation. Pancreas: Normal, without mass or ductal dilatation. Spleen: Normal in size, without focal abnormality. Adrenals/Urinary Tract: Normal adrenal glands. Normal left kidney. Interpolar right renal 1.4 cm lesion is similar in size to on the prior exam and again demonstrates complexity, including on image 50/2. There is also a lower pole right renal lesion which is favored to represent a cyst or  minimally complex cyst, including at 9 mm. No hydronephrosis. Normal urinary bladder. Stomach/Bowel: Gastric antral underdistention. Scattered colonic diverticula. Normal terminal ileum and appendix. Normal small bowel. Vascular/Lymphatic: Aortic and branch vessel atherosclerosis. No abdominopelvic adenopathy. Reproductive: Hysterectomy.  No adnexal mass. Other: No significant free fluid. No evidence of omental or peritoneal disease. Musculoskeletal: A lipoma within the right thigh musculature is relatively similar including at 9.0 cm. This is incompletely imaged. Mild osteopenia.  Lumbosacral spondylosis. IMPRESSION: 1. Similar size of a right middle lobe lung mass. 2. No change in radiation induced consolidation throughout the right lung. 3. No evidence of thoracic adenopathy. 4. No acute process or evidence of metastatic disease in the abdomen or pelvis. 5. Possible cirrhosis, as before. 6. 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 Electronically Signed   By: Abigail Miyamoto M.D.   On: 02/18/2019 15:51   Ct Abdomen Pelvis W Contrast  Result Date: 02/18/2019 CLINICAL DATA:  Right lung cancer diagnosed in 2015 with recurrence in 2018. Surgery, radiation therapy and chemotherapy. Chronic cough and shortness of breath. EXAM: CT CHEST, ABDOMEN, AND PELVIS WITH CONTRAST TECHNIQUE: Multidetector CT imaging of the chest, abdomen and pelvis was performed following the standard protocol during bolus administration of intravenous contrast. CONTRAST:  173m OMNIPAQUE IOHEXOL 300 MG/ML  SOLN COMPARISON:  12/19/2018 FINDINGS: CT CHEST FINDINGS Cardiovascular: Right Port-A-Cath tip at high right atrium. Normal heart size, without pericardial effusion. Multivessel coronary artery atherosclerosis. Aortic atherosclerosis. No central pulmonary embolism, on this non-dedicated study. Mediastinum/Nodes: Thyroidectomy. No supraclavicular adenopathy. No mediastinal or hilar  adenopathy. Small periesophageal nodes are unchanged and not pathologic by size criteria. Lungs/Pleura: Trace right pleural fluid is not significantly changed. Heterogeneous, likely centrally necrotic right middle lobe lung mass measures 5.3 x 4.4 cm and is similar to on the prior exam (when remeasured). Similar craniocaudal dimension of 2.9 cm. Calcified and noncalcified left lower lobe pulmonary nodules, including a noncalcified 3 mm nodule, are similar. Similar configuration of geographic consolidation involving the posteromedial right lung and anterior right lung base. Musculoskeletal: Lower thoracic spondylosis. CT ABDOMEN PELVIS FINDINGS Hepatobiliary: Irregular hepatic capsule again identified. No focal liver lesion. Normal gallbladder, without biliary ductal dilatation. Pancreas: Normal, without mass or ductal dilatation. Spleen: Normal in size, without focal abnormality. Adrenals/Urinary Tract: Normal adrenal glands. Normal left kidney. Interpolar right renal 1.4 cm lesion is similar in size to on the prior exam and again demonstrates complexity, including on image 50/2. There is also a lower pole right renal lesion which is favored to represent a cyst or minimally complex cyst,  including at 9 mm. No hydronephrosis. Normal urinary bladder. Stomach/Bowel: Gastric antral underdistention. Scattered colonic diverticula. Normal terminal ileum and appendix. Normal small bowel. Vascular/Lymphatic: Aortic and branch vessel atherosclerosis. No abdominopelvic adenopathy. Reproductive: Hysterectomy.  No adnexal mass. Other: No significant free fluid. No evidence of omental or peritoneal disease. Musculoskeletal: A lipoma within the right thigh musculature is relatively similar including at 9.0 cm. This is incompletely imaged. Mild osteopenia.  Lumbosacral spondylosis. IMPRESSION: 1. Similar size of a right middle lobe lung mass. 2. No change in radiation induced consolidation throughout the right lung. 3. No evidence  of thoracic adenopathy. 4. No acute process or evidence of metastatic disease in the abdomen or pelvis. 5. Possible cirrhosis, as before. 6. 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 Electronically Signed   By: Abigail Miyamoto M.D.   On: 02/18/2019 15:51     ASSESSMENT AND PLAN: This is a very pleasant 78 years old white female with recurrent non-small cell lung cancer, squamous cell carcinoma. The patient completed 6 cycles of systemic chemotherapy with carboplatin and paclitaxel and tolerated her treatment well except for fatigue as well as peripheral neuropathy. The patient has been in observation.  She continues to have persistent dry cough and shortness of breath. She had evidence for disease progression on restaging scan. The patient was started on treatment with second line immunotherapy with Nivolumab 480 mg IV every 4 weeks status post 14 cycles.  The patient has been tolerating this treatment well with no concerning adverse effects. She will have repeat CT scan of the chest, abdomen pelvis performed recently reviewed at bedside and independently reviewed the scan results with the patient and her daughter. Her scan showed no concerning findings for disease progression. I recommended for the patient to continue her current treatment with nivolumab and she will proceed with cycle #15 today. For the persistent nausea and vomiting, I will refer the patient to gastroenterology for further evaluation and consideration of upper endoscopy if needed. For the positional dizzy spells, I encouraged the patient to increase her oral intake and avoid sudden change in position. The patient voices understanding of current disease status and treatment options and is in agreement with the current care plan. All questions were answered. The patient knows to call the clinic with any problems, questions or concerns. We can certainly see the patient much sooner if  necessary.   Disclaimer: This note was dictated with voice recognition software. Similar sounding words can inadvertently be transcribed and may not be corrected upon review.

## 2019-02-21 ENCOUNTER — Telehealth: Payer: Self-pay | Admitting: Internal Medicine

## 2019-02-21 NOTE — Telephone Encounter (Signed)
LM for patient to return call to discuss correspondence from [place] with [employer]. 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 Debara Pickett recommended she have a carotid doppler study based on this.

## 2019-02-24 ENCOUNTER — Telehealth: Payer: Self-pay | Admitting: Internal Medicine

## 2019-02-24 DIAGNOSIS — H539 Unspecified visual disturbance: Secondary | ICD-10-CM

## 2019-02-24 MED ORDER — METOPROLOL SUCCINATE ER 25 MG PO TB24: 25.0000 mg | ORAL_TABLET | Freq: Every day | ORAL | 3 refills | Status: AC

## 2019-02-24 NOTE — Telephone Encounter (Signed)
Scheduled per los. Mailed printout  °

## 2019-02-24 NOTE — Telephone Encounter (Signed)
New message:     Patient returning call back from last week concering results.

## 2019-02-24 NOTE — Telephone Encounter (Signed)
Pt aware Dr Debara Pickett recommends having a carotid to R/O stenosis Order entered and instructed if does not hear from office to call back in the next couple of days ./cy

## 2019-02-25 ENCOUNTER — Encounter (HOSPITAL_COMMUNITY): Payer: Self-pay

## 2019-02-26 ENCOUNTER — Other Ambulatory Visit: Payer: Self-pay

## 2019-02-26 ENCOUNTER — Ambulatory Visit (HOSPITAL_COMMUNITY)
Admission: RE | Admit: 2019-02-26 | Discharge: 2019-02-26 | Disposition: A | Discharge status: Home / Self Care | Payer: Medicare Other | Source: Ambulatory Visit | Attending: Internal Medicine | Admitting: Internal Medicine

## 2019-02-26 DIAGNOSIS — H539 Unspecified visual disturbance: Secondary | ICD-10-CM | POA: Insufficient documentation

## 2019-02-26 NOTE — Telephone Encounter (Signed)
Carotid doppler 02/26/19

## 2019-02-26 NOTE — Patient Outreach (Signed)
[facility]) Care Management  02/26/2019  Kimberly [name] [birth year] [id number]    1st unsuccessful outreach to the patient for assessment.  No answer.  HIPAA compliant voice mail left with contact information.  Plan: RN Health Coach will send letter. [name] will make outreach attempt to the patient within thirty business days.  Kimberly Arms RN, BSN, [place] Direct Dial:  [phone]  Fax: [phone]

## 2019-02-27 ENCOUNTER — Other Ambulatory Visit: Payer: Self-pay

## 2019-02-27 NOTE — Patient Outreach (Signed)
[facility]) Care Management  02/27/2019  Kimberly [name] 1941-08-07 [id number]    Returned call to patient from voicemail left.  Line picked no response then phone hung up.  Unable to leave a voicemail.  Plan:  RN Health Coach will outreach the patient at the next scheduled interval.  [name] RN, BSN, [place]:  [phone]  Fax: [phone]

## 2019-02-28 ENCOUNTER — Telehealth: Payer: Self-pay | Admitting: Medical Oncology

## 2019-02-28 ENCOUNTER — Other Ambulatory Visit: Payer: Self-pay | Admitting: Internal Medicine

## 2019-02-28 DIAGNOSIS — R112 Nausea with vomiting, unspecified: Secondary | ICD-10-CM

## 2019-02-28 NOTE — Telephone Encounter (Signed)
Pt notified that GI referral was entered by [facility]. Message sent to scheduler.

## 2019-03-03 ENCOUNTER — Telehealth: Payer: Self-pay | Admitting: *Deleted

## 2019-03-03 NOTE — Telephone Encounter (Signed)
PT referred to [facility], pt called states she is already established with Dr. Benson Norway. Call to office appt for pt July 13 at 2:15pm.  Notified pt of appt date/time Pt records faxed to Dr. Minerva Areola office [phone]

## 2019-03-03 NOTE — Telephone Encounter (Signed)
Pt called with concerns about recent GI referral. Call to [facility] to inquire. Office advised they did not receive as the referral went to the Endoscopy side instead of GI side. Office will schedule pt to be seen and call her with appt date and time. Notified pt to expect a call from [facility] scheduling. No further concerns.

## 2019-03-13 DIAGNOSIS — M419 Scoliosis, unspecified: Secondary | ICD-10-CM | POA: Diagnosis not present

## 2019-03-13 DIAGNOSIS — M545 Low back pain: Secondary | ICD-10-CM | POA: Diagnosis not present

## 2019-03-13 DIAGNOSIS — M5136 Other intervertebral disc degeneration, lumbar region: Secondary | ICD-10-CM | POA: Diagnosis not present

## 2019-03-17 ENCOUNTER — Other Ambulatory Visit: Payer: Self-pay | Admitting: Gastroenterology

## 2019-03-17 DIAGNOSIS — C3490 Malignant neoplasm of unspecified part of unspecified bronchus or lung: Secondary | ICD-10-CM | POA: Diagnosis not present

## 2019-03-17 DIAGNOSIS — R112 Nausea with vomiting, unspecified: Secondary | ICD-10-CM | POA: Diagnosis not present

## 2019-03-17 DIAGNOSIS — J9611 Chronic respiratory failure with hypoxia: Secondary | ICD-10-CM | POA: Diagnosis not present

## 2019-03-18 ENCOUNTER — Other Ambulatory Visit (HOSPITAL_COMMUNITY)
Admission: RE | Admit: 2019-03-18 | Discharge: 2019-03-18 | Disposition: A | Discharge status: Home / Self Care | Payer: Medicare Other | Source: Ambulatory Visit | Attending: Gastroenterology | Admitting: Gastroenterology

## 2019-03-18 DIAGNOSIS — Z1159 Encounter for screening for other viral diseases: Secondary | ICD-10-CM | POA: Diagnosis not present

## 2019-03-18 LAB — SARS CORONAVIRUS 2 (TAT 6-24 HRS): SARS Coronavirus 2: NEGATIVE

## 2019-03-20 ENCOUNTER — Encounter: Payer: Self-pay | Admitting: Internal Medicine

## 2019-03-20 ENCOUNTER — Inpatient Hospital Stay: Payer: Medicare Other

## 2019-03-20 ENCOUNTER — Inpatient Hospital Stay (HOSPITAL_BASED_OUTPATIENT_CLINIC_OR_DEPARTMENT_OTHER): Payer: Medicare Other | Admitting: Internal Medicine

## 2019-03-20 ENCOUNTER — Other Ambulatory Visit: Payer: Self-pay

## 2019-03-20 ENCOUNTER — Inpatient Hospital Stay: Payer: Medicare Other | Attending: Internal Medicine

## 2019-03-20 VITALS — Temp 97.1°F

## 2019-03-20 VITALS — BP 162/71 | HR 80 | Resp 19 | Wt 145.0 lb

## 2019-03-20 DIAGNOSIS — Z5112 Encounter for antineoplastic immunotherapy: Secondary | ICD-10-CM | POA: Insufficient documentation

## 2019-03-20 DIAGNOSIS — Z452 Encounter for adjustment and management of vascular access device: Secondary | ICD-10-CM | POA: Insufficient documentation

## 2019-03-20 DIAGNOSIS — C342 Malignant neoplasm of middle lobe, bronchus or lung: Secondary | ICD-10-CM

## 2019-03-20 DIAGNOSIS — R05 Cough: Secondary | ICD-10-CM | POA: Diagnosis not present

## 2019-03-20 DIAGNOSIS — R0602 Shortness of breath: Secondary | ICD-10-CM

## 2019-03-20 DIAGNOSIS — R5383 Other fatigue: Secondary | ICD-10-CM

## 2019-03-20 DIAGNOSIS — Z9981 Dependence on supplemental oxygen: Secondary | ICD-10-CM

## 2019-03-20 DIAGNOSIS — Z79899 Other long term (current) drug therapy: Secondary | ICD-10-CM | POA: Diagnosis not present

## 2019-03-20 DIAGNOSIS — R630 Anorexia: Secondary | ICD-10-CM

## 2019-03-20 DIAGNOSIS — R634 Abnormal weight loss: Secondary | ICD-10-CM

## 2019-03-20 DIAGNOSIS — E860 Dehydration: Secondary | ICD-10-CM

## 2019-03-20 LAB — CBC WITH DIFFERENTIAL (CANCER CENTER ONLY)
Abs Immature Granulocytes: 0.17 10*3/uL — ABNORMAL HIGH (ref 0.00–0.07)
Basophils Absolute: 0 10*3/uL (ref 0.0–0.1)
Basophils Relative: 0 %
Eosinophils Absolute: 0.2 10*3/uL (ref 0.0–0.5)
Eosinophils Relative: 1 %
HCT: 36.5 % (ref 36.0–46.0)
Hemoglobin: 12.4 g/dL (ref 12.0–15.0)
Immature Granulocytes: 1 %
Lymphocytes Relative: 10 %
Lymphs Abs: 1.6 10*3/uL (ref 0.7–4.0)
MCH: 30.2 pg (ref 26.0–34.0)
MCHC: 34 g/dL (ref 30.0–36.0)
MCV: 89 fL (ref 80.0–100.0)
Monocytes Absolute: 1.3 10*3/uL — ABNORMAL HIGH (ref 0.1–1.0)
Monocytes Relative: 8 %
Neutro Abs: 12.7 10*3/uL — ABNORMAL HIGH (ref 1.7–7.7)
Neutrophils Relative %: 80 %
Platelet Count: 392 10*3/uL (ref 150–400)
RBC: 4.1 MIL/uL (ref 3.87–5.11)
RDW: 13 % (ref 11.5–15.5)
WBC Count: 16.1 10*3/uL — ABNORMAL HIGH (ref 4.0–10.5)
nRBC: 0 % (ref 0.0–0.2)

## 2019-03-20 LAB — CMP (CANCER CENTER ONLY)
ALT: 17 U/L (ref 0–44)
AST: 17 U/L (ref 15–41)
Albumin: 3.7 g/dL (ref 3.5–5.0)
Alkaline Phosphatase: 133 U/L — ABNORMAL HIGH (ref 38–126)
Anion gap: 10 (ref 5–15)
BUN: 26 mg/dL — ABNORMAL HIGH (ref 8–23)
CO2: 33 mmol/L — ABNORMAL HIGH (ref 22–32)
Calcium: 9.3 mg/dL (ref 8.9–10.3)
Chloride: 90 mmol/L — ABNORMAL LOW (ref 98–111)
Creatinine: 0.98 mg/dL (ref 0.44–1.00)
GFR, Est AFR Am: >60 mL/min (ref >60)
GFR, Estimated: 56 mL/min — ABNORMAL LOW (ref >60)
Glucose, Bld: 91 mg/dL (ref 70–99)
Potassium: 3.4 mmol/L — ABNORMAL LOW (ref 3.5–5.1)
Sodium: 133 mmol/L — ABNORMAL LOW (ref 135–145)
Total Bilirubin: 0.8 mg/dL (ref 0.3–1.2)
Total Protein: 7 g/dL (ref 6.5–8.1)

## 2019-03-20 LAB — TSH: TSH: 2.857 u[IU]/mL (ref 0.308–3.960)

## 2019-03-20 MED ORDER — SODIUM CHLORIDE 0.9 % IV SOLN
480.0000 mg | Freq: Once | INTRAVENOUS | Status: AC
  Administered 2019-03-20: 480 mg via INTRAVENOUS
  Filled 2019-03-20: qty 48

## 2019-03-20 MED ORDER — HEPARIN SOD (PORK) LOCK FLUSH 100 UNIT/ML IV SOLN
500.0000 [IU] | Freq: Once | INTRAVENOUS | Status: AC | PRN
  Administered 2019-03-20: 500 [IU]
  Filled 2019-03-20: qty 5

## 2019-03-20 MED ORDER — SODIUM CHLORIDE 0.9% FLUSH
10.0000 mL | INTRAVENOUS | Status: DC | PRN
  Administered 2019-03-20: 14:00:00 10 mL
  Filled 2019-03-20: qty 10

## 2019-03-20 MED ORDER — ALTEPLASE 2 MG IJ SOLR
2.0000 mg | Freq: Once | INTRAMUSCULAR | Status: AC | PRN
  Administered 2019-03-20: 2 mg
  Filled 2019-03-20: qty 2

## 2019-03-20 MED ORDER — SODIUM CHLORIDE 0.9 % IV SOLN
Freq: Once | INTRAVENOUS | Status: AC
  Administered 2019-03-20: 13:00:00 via INTRAVENOUS
  Filled 2019-03-20: qty 250

## 2019-03-20 MED ORDER — SODIUM CHLORIDE 0.9% FLUSH
10.0000 mL | INTRAVENOUS | Status: DC | PRN
  Administered 2019-03-20: 10 mL
  Filled 2019-03-20: qty 10

## 2019-03-20 MED ORDER — ALTEPLASE 2 MG IJ SOLR
INTRAMUSCULAR | Status: AC
  Filled 2019-03-20: qty 2

## 2019-03-20 NOTE — Patient Instructions (Signed)
[facility] Discharge Instructions for Patients Receiving Chemotherapy  Today you received the following chemotherapy agents Opdivo  To help prevent nausea and vomiting after your treatment, we encourage you to take your nausea medication as directed   If you develop nausea and vomiting that is not controlled by your nausea medication, call the clinic.   BELOW ARE SYMPTOMS THAT SHOULD BE REPORTED IMMEDIATELY:  *FEVER GREATER THAN 100.5 F  *CHILLS WITH OR WITHOUT FEVER  NAUSEA AND VOMITING THAT IS NOT CONTROLLED WITH YOUR NAUSEA MEDICATION  *UNUSUAL SHORTNESS OF BREATH  *UNUSUAL BRUISING OR BLEEDING  TENDERNESS IN MOUTH AND THROAT WITH OR WITHOUT PRESENCE OF ULCERS  *URINARY PROBLEMS  *BOWEL PROBLEMS  UNUSUAL RASH Items with * indicate a potential emergency and should be followed up as soon as possible.  Feel free to call the clinic should you have any questions or concerns. The clinic phone number is [phone].  Please show the CHEMO ALERT CARD at check-in to the Emergency Department and triage nurse.   

## 2019-03-20 NOTE — Progress Notes (Signed)
[place] [phone]   Fax:([phone]) [place], [place] [address] [place] [address]  DIAGNOSIS: Recurrent non-small cell lung cancer, squamous cell carcinoma initially diagnosed as unresectable a stage IIIa in February 2015.  PRIOR THERAPY: 1) Status post exploratory right VATS with mediastinal biopsy under the care of Dr. Roxan Hockey on 11/13/2013. The tumor was found to be unresectable at that time.[name] 2) Concurrent chemoradiation with weekly carboplatin for AUC of 2 and paclitaxel 45 mg/M2, status post 6 cycles with partial response. First cycle was given on 12/01/2013. 3) Consolidation chemotherapy with carboplatin for AUC of 5 and paclitaxel 175 mg/M2 every 3 weeks with Neulasta support. First dose 02/23/2014. Status post 3 cycles with partial response. 4) Systemic chemotherapy with carboplatin for AUC of 5 and paclitaxel 175 MG/M2 every 3 weeks with Neulasta support. Status post 6 cycles. First dose was given on 08/14/2016.  Last dose was giving 12/11/2016 with partial response.  CURRENT THERAPY: Second line treatment with immunotherapy with Nivolumab 480 mg IV every 4 weeks, first dose Jan 25, 2018.  Status post 15 cycles.  INTERVAL HISTORY: Kimberly [name] 78 y.o. female returns to the clinic today for follow-up visit.  The patient is feeling fine today with no concerning complaints except for the baseline shortness of breath and she is currently on home oxygen.  She denied having any current chest pain, cough or hemoptysis.  She has no nausea, vomiting, diarrhea or constipation.  She continues to have lack of appetite and she lost 1 pound since her last visit.  She is currently evaluated by gastroenterology, Dr. Benson Norway and she is scheduled for upper endoscopy soon.  She is here today for evaluation before starting cycle #16.   MEDICAL HISTORY: Past Medical History:  Diagnosis Date  . Allergic asthma  without acute exacerbation or status asthmaticus   . Aortic insufficiency   . Arthritis   . Atrial fibrillation ([place])   . Back pain 08/14/2016  . Bronchitis   . Cough    dry, endobronchial mass  . Dyslipidemia   . Family history of adverse reaction to anesthesia    pts son also experiences N/V  . GERD (gastroesophageal reflux disease)   . Heart murmur   . History of blood transfusion   . History of bronchitis   . History of chemotherapy   . History of nuclear stress test 02/26/2006   exercise myoview; normal pattern of perfusion; low risk scan   . Hypertension   . Hypothyroidism   . Mitral insufficiency   . Pneumonia   . PONV (postoperative nausea and vomiting)   . Radiation 12/01/13-01/08/14   50.4 gray to right central chest  . Renal mass, left 04/12/2017  . Shortness of breath    with exertion  . Squamous cell carcinoma of lung ([place])   . Syncope    "states she has passed out a few times. dr is trying to find cause.last time when geeting ready to go home after video bronch/bx  . Thyroid disease     ALLERGIES:  is allergic to lactose intolerance (gi); amiodarone; augmentin [amoxicillin-pot clavulanate]; milk-related compounds; and oxycontin  [oxycodone hcl].  MEDICATIONS:  Current Outpatient Medications  Medication Sig Dispense Refill  . albuterol (VENTOLIN HFA) 108 (90 Base) MCG/ACT inhaler Inhale 1 puff into the lungs every 6 (six) hours as needed (before walking or heavy activity [try to limit to twice daily]). 1 Inhaler 5  .  amLODipine (NORVASC) 5 MG tablet TAKE 1 TABLET BY MOUTH EVERY DAY (Patient taking differently: Take 5 mg by mouth daily. ) 90 tablet 3  . atorvastatin (LIPITOR) 20 MG tablet Take 20 mg by mouth daily.     . chlorthalidone (HYGROTON) 25 MG tablet TAKE 1 TABLET BY MOUTH  DAILY (Patient taking differently: Take 25 mg by mouth daily. ) 90 tablet 2  . fluticasone (FLONASE) 50 MCG/ACT nasal spray Place 2 sprays into both nostrils daily as needed (allergies.).      [name] levothyroxine (SYNTHROID, LEVOTHROID) 100 MCG tablet Take 100 mcg by mouth every evening.     . lidocaine-prilocaine (EMLA) cream Apply 1 application topically as needed. (Patient taking differently: Apply 1 application topically as needed (for port access). ) 30 g 0  . metoprolol succinate (TOPROL-XL) 25 MG 24 hr tablet Take 1 tablet (25 mg total) by mouth daily. 90 tablet 3  . Mometasone Furoate (ASMANEX HFA) 100 MCG/ACT AERO Inhale 2 puffs into the lungs 2 (two) times daily. 39 g 3  . montelukast (SINGULAIR) 10 MG tablet Take 10 mg by mouth at bedtime.     . Multiple Vitamins-Iron (MULTIVITAMIN/IRON PO) Take 1 tablet by mouth daily with breakfast.     . pantoprazole (PROTONIX) 40 MG tablet TAKE 1 TABLET BY MOUTH  TWICE DAILY (Patient taking differently: Take 40 mg by mouth 2 (two) times daily. ) 120 tablet 0  . potassium chloride (KLOR-CON) 8 MEQ tablet Take 8 mEq by mouth every other day. In morning    . prochlorperazine (COMPAZINE) 10 MG tablet TAKE 1 TABLET (10 MG TOTAL) BY MOUTH EVERY 6 (SIX) HOURS AS NEEDED FOR NAUSEA OR VOMITING. (Patient taking differently: Take 10 mg by mouth See admin instructions. Take 1 tablet (10 mg) by mouth scheduled in the morning & may take every 6 hours as needed for nausea.) 30 tablet 0  . traMADol (ULTRAM) 50 MG tablet Take 25-50 mg by mouth every 8 (eight) hours as needed (severe pain.).   3   No current facility-administered medications for this visit.     SURGICAL HISTORY:  Past Surgical History:  Procedure Laterality Date  . CARDIAC CATHETERIZATION  07/08/2008   normal coronaries  . CARPAL TUNNEL RELEASE Right 1988  . COLONOSCOPY N/A 02/11/2016   Procedure: COLONOSCOPY;  Surgeon: Carol Ada, MD;  Location: [facility];  Service: Endoscopy;  Laterality: N/A;  . DILATION AND CURETTAGE OF UTERUS    . ESOPHAGOGASTRODUODENOSCOPY N/A 02/08/2017   Procedure: ESOPHAGOGASTRODUODENOSCOPY (EGD);  Surgeon: Carol Ada, MD;  Location: [facility];   Service: Endoscopy;  Laterality: N/A;  . EYE SURGERY Bilateral   . H/O MET Test w/PFT  04/02/2012   low risk; peak VO2 77% predicted  . IR GENERIC HISTORICAL  09/12/2016   IR FLUORO GUIDE PORT INSERTION RIGHT 09/12/2016 [name], MD [facility]  . IR GENERIC HISTORICAL  09/12/2016   IR US GUIDE VASC ACCESS RIGHT 09/12/2016 [name], MD [facility]  . JOINT REPLACEMENT  2003   thumb rt  . KNEE ARTHROSCOPY  12   rt meniscus  . MEDIASTINOSCOPY N/A 10/30/2013   Procedure: MEDIASTINOSCOPY;  Surgeon: Melrose Nakayama, MD;  Location: [facility];  Service: Thoracic;  Laterality: N/A;  . ROTATOR CUFF REPAIR  2006   ? side  . THYROIDECTOMY  1973  . TRANSTHORACIC ECHOCARDIOGRAM  09/25/2012   EF 55-60%; mild LVH & mild concentric hypertrophy; mild AV regurg; RV systolic pressure increase consistent with mild pulm HTN  .  VIDEO ASSISTED THORACOSCOPY (VATS)/ LOBECTOMY Right 11/13/2013   Procedure: VIDEO ASSISTED THORACOSCOPY (VATS) with mediastinal  biopsies;  Surgeon: Melrose Nakayama, MD;  Location: [facility];  Service: Thoracic;  Laterality: Right;  RIGHT VATS,mediastinal biopsies  . VIDEO BRONCHOSCOPY Bilateral 09/25/2013   Procedure: VIDEO BRONCHOSCOPY WITHOUT FLUORO;  Surgeon: Tanda Rockers, MD;  Location: [facility];  Service: Cardiopulmonary;  Laterality: Bilateral;  . VIDEO BRONCHOSCOPY WITH ENDOBRONCHIAL ULTRASOUND N/A 10/30/2013   Procedure: VIDEO BRONCHOSCOPY WITH ENDOBRONCHIAL ULTRASOUND;  Surgeon: Melrose Nakayama, MD;  Location: [facility];  Service: Thoracic;  Laterality: N/A;    REVIEW OF SYSTEMS:  A comprehensive review of systems was negative except for: Constitutional: positive for anorexia and fatigue Respiratory: positive for dyspnea on exertion   PHYSICAL EXAMINATION: General appearance: alert, cooperative, fatigued and no distress Head: Normocephalic, without obvious abnormality, atraumatic Neck: no adenopathy, no JVD, supple, symmetrical, trachea midline and thyroid  not enlarged, symmetric, no tenderness/mass/nodules Lymph nodes: Cervical, supraclavicular, and axillary nodes normal. Resp: clear to auscultation bilaterally Back: symmetric, no curvature. ROM normal. No CVA tenderness. Cardio: regular rate and rhythm, S1, S2 normal, no murmur, click, rub or gallop GI: soft, non-tender; bowel sounds normal; no masses,  no organomegaly Extremities: extremities normal, atraumatic, no cyanosis or edema  ECOG PERFORMANCE STATUS: 1 - Symptomatic but completely ambulatory  Blood pressure (!) 162/71, pulse 80, resp. rate 19, weight 145 lb (65.8 kg), SpO2 99 %.  LABORATORY DATA: Lab Results  Component Value Date   WBC 9.4 02/20/2019   HGB 11.1 (L) 02/20/2019   HCT 33.0 (L) 02/20/2019   MCV 89.9 02/20/2019   PLT 337 02/20/2019      Chemistry      Component Value Date/Time   NA 129 (L) 02/20/2019 1146   NA 140 07/12/2017 1212   K 3.6 02/20/2019 1146   K 3.5 07/12/2017 1212   CL 87 (L) 02/20/2019 1146   CO2 29 02/20/2019 1146   CO2 27 07/12/2017 1212   BUN 14 02/20/2019 1146   BUN 27.5 (H) 07/12/2017 1212   CREATININE 0.93 02/20/2019 1146   CREATININE 1.0 07/12/2017 1212      Component Value Date/Time   CALCIUM 9.4 02/20/2019 1146   CALCIUM 10.0 07/12/2017 1212   ALKPHOS 156 (H) 02/20/2019 1146   ALKPHOS 119 07/12/2017 1212   AST 17 02/20/2019 1146   AST 16 07/12/2017 1212   ALT 13 02/20/2019 1146   ALT 15 07/12/2017 1212   BILITOT 0.6 02/20/2019 1146   BILITOT 0.48 07/12/2017 1212       RADIOGRAPHIC STUDIES: Vas US Carotid  Result Date: 02/27/2019 Carotid Arterial Duplex Study Indications:  Visual disturbance. 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 She does experience migraine headaches. Otherwise she               denies any other cerebrovascular symptoms today. Risk Factors: Hypertension, hyperlipidemia, past history of  smoking. Performing Technologist: Salvadore Dom RVT, RDCS (AE), RDMS  Examination Guidelines: A complete evaluation includes B-mode imaging, spectral Doppler, color Doppler, and power Doppler as needed of all accessible portions of each vessel. Bilateral testing is considered an integral part of a complete examination. Limited examinations for reoccurring indications may be performed as noted.  Right Carotid  Findings: +----------+--------+--------+--------+------------+--------+           PSV cm/sEDV cm/sStenosisDescribe    Comments +----------+--------+--------+--------+------------+--------+ CCA Prox  52      9                                    +----------+--------+--------+--------+------------+--------+ CCA Distal42      10                                   +----------+--------+--------+--------+------------+--------+ ICA Prox  120     27      1-39%   heterogenous         +----------+--------+--------+--------+------------+--------+ ICA Mid   112     22                                   +----------+--------+--------+--------+------------+--------+ ICA Distal86      20                                   +----------+--------+--------+--------+------------+--------+ ECA       49      7                                    +----------+--------+--------+--------+------------+--------+ +----------+--------+-------+----------------+-------------------+           PSV cm/sEDV cmsDescribe        Arm Pressure (mmHG) +----------+--------+-------+----------------+-------------------+ [id number]             Multiphasic, [id number]                 +----------+--------+-------+----------------+-------------------+ +---------+--------+--+--------+--+---------+ VertebralPSV cm/s52EDV cm/s12Antegrade +---------+--------+--+--------+--+---------+  Left Carotid Findings: +----------+--------+--------+--------+------------+--------+           PSV cm/sEDV  cm/sStenosisDescribe    Comments +----------+--------+--------+--------+------------+--------+ CCA Prox  60      11                                   +----------+--------+--------+--------+------------+--------+ CCA Mid                   <50%    heterogenous         +----------+--------+--------+--------+------------+--------+ CCA Distal40      10                                   +----------+--------+--------+--------+------------+--------+ ICA Prox  46      11              heterogenous         +----------+--------+--------+--------+------------+--------+ ICA Mid   48      14      1-39%                        +----------+--------+--------+--------+------------+--------+ ICA Distal94      26                          tortuous +----------+--------+--------+--------+------------+--------+ ECA       47  8                                    +----------+--------+--------+--------+------------+--------+ +----------+--------+--------+----------------+-------------------+ SubclavianPSV cm/sEDV cm/sDescribe        Arm Pressure (mmHG) +----------+--------+--------+----------------+-------------------+           113             Multiphasic, [id number]                 +----------+--------+--------+----------------+-------------------+ +---------+--------+--+--------+--+---------+ VertebralPSV cm/s58EDV cm/s18Antegrade +---------+--------+--+--------+--+---------+  Summary: Right Carotid: Velocities in the right ICA are consistent with a 1-39% stenosis. Left Carotid: Velocities in the left ICA are consistent with a 1-39% stenosis.               Non-hemodynamically significant plaque noted in the CCA. Vertebrals:  Bilateral vertebral arteries demonstrate antegrade flow. Subclavians: Normal flow hemodynamics were seen in bilateral subclavian              arteries. *See table(s) above for measurements and observations.  Electronically signed by Carlyle Dolly  MD on 02/27/2019 at 4:02:36 PM.    Final      ASSESSMENT AND PLAN: This is a very pleasant 78 years old white female with recurrent non-small cell lung cancer, squamous cell carcinoma. The patient completed 6 cycles of systemic chemotherapy with carboplatin and paclitaxel and tolerated her treatment well except for fatigue as well as peripheral neuropathy. The patient has been in observation.  She continues to have persistent dry cough and shortness of breath. She had evidence for disease progression on restaging scan. The patient was started on treatment with second line immunotherapy with Nivolumab 480 mg IV every 4 weeks status post 15 cycles.  She has been tolerating the treatment well with no concerning adverse effects. I recommended for the patient to proceed with cycle #16 today as planned. For the persistent nausea and vomiting, she was seen by Dr. Benson Norway and expected to have upper endoscopy soon. She will come back for follow-up visit in 4 weeks for evaluation before the next cycle of her treatment. The patient voices understanding of current disease status and treatment options and is in agreement with the current care plan. All questions were answered. The patient knows to call the clinic with any problems, questions or concerns. We can certainly see the patient much sooner if necessary.   Disclaimer: This note was dictated with voice recognition software. Similar sounding words can inadvertently be transcribed and may not be corrected upon review.

## 2019-03-21 ENCOUNTER — Ambulatory Visit (HOSPITAL_COMMUNITY)
Admission: RE | Admit: 2019-03-21 | Discharge: 2019-03-21 | Disposition: A | Discharge status: Home / Self Care | Payer: Medicare Other | Attending: Gastroenterology | Admitting: Gastroenterology

## 2019-03-21 ENCOUNTER — Ambulatory Visit (HOSPITAL_COMMUNITY): Payer: Medicare Other | Admitting: Certified Registered"

## 2019-03-21 ENCOUNTER — Encounter (HOSPITAL_COMMUNITY): Payer: Self-pay | Admitting: Anesthesiology

## 2019-03-21 ENCOUNTER — Encounter (HOSPITAL_COMMUNITY)
Admission: RE | Disposition: A | Discharge status: Home / Self Care | Payer: Self-pay | Source: Home / Self Care | Attending: Gastroenterology

## 2019-03-21 ENCOUNTER — Telehealth: Payer: Self-pay | Admitting: Internal Medicine

## 2019-03-21 DIAGNOSIS — Z6828 Body mass index (BMI) 28.0-28.9, adult: Secondary | ICD-10-CM | POA: Diagnosis not present

## 2019-03-21 DIAGNOSIS — I080 Rheumatic disorders of both mitral and aortic valves: Secondary | ICD-10-CM | POA: Insufficient documentation

## 2019-03-21 DIAGNOSIS — R12 Heartburn: Secondary | ICD-10-CM | POA: Diagnosis not present

## 2019-03-21 DIAGNOSIS — Z923 Personal history of irradiation: Secondary | ICD-10-CM | POA: Diagnosis not present

## 2019-03-21 DIAGNOSIS — K449 Diaphragmatic hernia without obstruction or gangrene: Secondary | ICD-10-CM | POA: Diagnosis not present

## 2019-03-21 DIAGNOSIS — Z881 Allergy status to other antibiotic agents status: Secondary | ICD-10-CM | POA: Diagnosis not present

## 2019-03-21 DIAGNOSIS — Z885 Allergy status to narcotic agent status: Secondary | ICD-10-CM | POA: Diagnosis not present

## 2019-03-21 DIAGNOSIS — E039 Hypothyroidism, unspecified: Secondary | ICD-10-CM | POA: Diagnosis not present

## 2019-03-21 DIAGNOSIS — Z9221 Personal history of antineoplastic chemotherapy: Secondary | ICD-10-CM | POA: Diagnosis not present

## 2019-03-21 DIAGNOSIS — J45909 Unspecified asthma, uncomplicated: Secondary | ICD-10-CM | POA: Diagnosis not present

## 2019-03-21 DIAGNOSIS — E785 Hyperlipidemia, unspecified: Secondary | ICD-10-CM | POA: Diagnosis not present

## 2019-03-21 DIAGNOSIS — Z888 Allergy status to other drugs, medicaments and biological substances status: Secondary | ICD-10-CM | POA: Insufficient documentation

## 2019-03-21 DIAGNOSIS — Z8249 Family history of ischemic heart disease and other diseases of the circulatory system: Secondary | ICD-10-CM | POA: Diagnosis not present

## 2019-03-21 DIAGNOSIS — M1990 Unspecified osteoarthritis, unspecified site: Secondary | ICD-10-CM | POA: Insufficient documentation

## 2019-03-21 DIAGNOSIS — E739 Lactose intolerance, unspecified: Secondary | ICD-10-CM | POA: Diagnosis not present

## 2019-03-21 DIAGNOSIS — I4891 Unspecified atrial fibrillation: Secondary | ICD-10-CM | POA: Insufficient documentation

## 2019-03-21 DIAGNOSIS — R634 Abnormal weight loss: Secondary | ICD-10-CM | POA: Diagnosis not present

## 2019-03-21 DIAGNOSIS — I10 Essential (primary) hypertension: Secondary | ICD-10-CM | POA: Insufficient documentation

## 2019-03-21 DIAGNOSIS — Z87891 Personal history of nicotine dependence: Secondary | ICD-10-CM | POA: Insufficient documentation

## 2019-03-21 DIAGNOSIS — R011 Cardiac murmur, unspecified: Secondary | ICD-10-CM | POA: Insufficient documentation

## 2019-03-21 DIAGNOSIS — Z801 Family history of malignant neoplasm of trachea, bronchus and lung: Secondary | ICD-10-CM | POA: Diagnosis not present

## 2019-03-21 DIAGNOSIS — Z85118 Personal history of other malignant neoplasm of bronchus and lung: Secondary | ICD-10-CM | POA: Insufficient documentation

## 2019-03-21 DIAGNOSIS — K219 Gastro-esophageal reflux disease without esophagitis: Secondary | ICD-10-CM | POA: Insufficient documentation

## 2019-03-21 DIAGNOSIS — Z880 Allergy status to penicillin: Secondary | ICD-10-CM | POA: Diagnosis not present

## 2019-03-21 HISTORY — PX: ESOPHAGOGASTRODUODENOSCOPY (EGD) WITH PROPOFOL: SHX5813

## 2019-03-21 SURGERY — ESOPHAGOGASTRODUODENOSCOPY (EGD) WITH PROPOFOL
Anesthesia: Monitor Anesthesia Care

## 2019-03-21 MED ORDER — PROPOFOL 10 MG/ML IV BOLUS
INTRAVENOUS | Status: AC
  Filled 2019-03-21: qty 20

## 2019-03-21 MED ORDER — LACTATED RINGERS IV SOLN
INTRAVENOUS | Status: DC
  Administered 2019-03-21: 1000 mL via INTRAVENOUS
  Administered 2019-03-21: 11:00:00 via INTRAVENOUS

## 2019-03-21 MED ORDER — PROPOFOL 500 MG/50ML IV EMUL
INTRAVENOUS | Status: DC | PRN
  Administered 2019-03-21: 125 ug/kg/min via INTRAVENOUS

## 2019-03-21 MED ORDER — SODIUM CHLORIDE 0.9 % IV SOLN: INTRAVENOUS | Status: DC

## 2019-03-21 MED ORDER — ONDANSETRON HCL 4 MG/2ML IJ SOLN
INTRAMUSCULAR | Status: DC | PRN
  Administered 2019-03-21: 4 mg via INTRAVENOUS

## 2019-03-21 MED ORDER — PROPOFOL 10 MG/ML IV BOLUS
INTRAVENOUS | Status: AC
  Filled 2019-03-21: qty 40

## 2019-03-21 MED ORDER — PROPOFOL 10 MG/ML IV BOLUS
INTRAVENOUS | Status: DC | PRN
  Administered 2019-03-21: 20 mg via INTRAVENOUS

## 2019-03-21 SURGICAL SUPPLY — 15 items

## 2019-03-21 NOTE — Anesthesia Preprocedure Evaluation (Addendum)
Anesthesia Evaluation  Patient identified by MRN, date of birth, ID band Patient awake    Reviewed: Allergy & Precautions, NPO status , Patient's Chart, lab work & pertinent test results, reviewed documented beta blocker date and time   History of Anesthesia Complications (+) PONV  Airway Mallampati: II  TM Distance: >3 FB Neck ROM: Full    Dental no notable dental hx.    Pulmonary asthma , former smoker,    Pulmonary exam normal        Cardiovascular hypertension, Pt. on medications and Pt. on home beta blockers Normal cardiovascular exam+ dysrhythmias Atrial Fibrillation   Nuclear stress 2017: EF 55-65%, low risk study     Neuro/Psych negative neurological ROS     GI/Hepatic Neg liver ROS, GERD  Medicated and Controlled,  Endo/Other  Hypothyroidism   Renal/GU negative Renal ROS     Musculoskeletal  (+) Arthritis ,   Abdominal   Peds  Hematology negative hematology ROS (+)   Anesthesia Other Findings Day of surgery medications reviewed with the patient.  Reproductive/Obstetrics                            Anesthesia Physical Anesthesia Plan  ASA: III  Anesthesia Plan: MAC   Post-op Pain Management:    Induction:   PONV Risk Score and Plan: 3 and Treatment may vary due to age or medical condition and Propofol infusion  Airway Management Planned: Natural Airway and Nasal Cannula  Additional Equipment:   Intra-op Plan:   Post-operative Plan:   Informed Consent: I have reviewed the patients History and Physical, chart, labs and discussed the procedure including the risks, benefits and alternatives for the proposed anesthesia with the patient or authorized representative who has indicated his/her understanding and acceptance.     Dental advisory given  Plan Discussed with: CRNA  Anesthesia Plan Comments:        Anesthesia Quick Evaluation

## 2019-03-21 NOTE — Anesthesia Postprocedure Evaluation (Signed)
Anesthesia Post Note  Patient: Kimberly [name]  Procedure(s) Performed: ESOPHAGOGASTRODUODENOSCOPY (EGD) WITH PROPOFOL (N/A )     Patient location during evaluation: PACU Anesthesia Type: MAC Level of consciousness: awake and alert Pain management: pain level controlled Vital Signs Assessment: post-procedure vital signs reviewed and stable Respiratory status: spontaneous breathing, nonlabored ventilation and respiratory function stable Cardiovascular status: blood pressure returned to baseline and stable Postop Assessment: no apparent nausea or vomiting Anesthetic complications: no    Last Vitals:  Vitals:   03/21/19 1105 03/21/19 1107  BP:  (!) 122/56  Pulse:    Resp:  13  Temp: (!) 36.3 C   SpO2:      Last Pain:  Vitals:   03/21/19 1107  TempSrc:   PainSc: 0-No pain                 Brennan Bailey

## 2019-03-21 NOTE — H&P (Signed)
Kimberly [name] HPI: Her EGD on 02/08/2017 was positive for NSAID erosions and a 3 cm hiatal hernia. Over the past 6 months she has lost 5 lbs, but her weight loss would be worse if she did not force herself to eat. The patient has no desire to eat. She eats a well-balanced diet 3 times per day. Her persistent nausea has lasted continuously for 6 months and she does have intermittent vomiting. She can feel a reflux sensation and she needs a bowel to vomit the bile. The patient will vomit 4-6 time per week and last evening was the last time she vomited. There were no reports of abdominal pain, dysphagia, or odynophagia. She is Stage IV nonsmall cell lung cancer. Her primary lesion has grown to 5 cm and there a met to one of her kidneys.    Past Medical History:  Diagnosis Date  . Allergic asthma without acute exacerbation or status asthmaticus   . Aortic insufficiency   . Arthritis   . Atrial fibrillation ([place])   . Back pain 08/14/2016  . Bronchitis   . Cough    dry, endobronchial mass  . Dyslipidemia   . GERD (gastroesophageal reflux disease)   . Heart murmur   . History of blood transfusion   . History of bronchitis   . History of chemotherapy   . History of nuclear stress test 02/26/2006   exercise myoview; normal pattern of perfusion; low risk scan   . Hypertension   . Hypothyroidism   . Mitral insufficiency   . Pneumonia   . PONV (postoperative nausea and vomiting)   . Radiation 12/01/13-01/08/14   50.4 gray to right central chest  . Renal mass, left 04/12/2017  . Shortness of breath    with exertion  . 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 Syncope    "states she has passed out a few times. dr is trying to find cause.last time when geeting ready to go home after video bronch/bx    Past Surgical History:  Procedure Laterality Date  . CARDIAC CATHETERIZATION  07/08/2008   normal coronaries  . CARPAL TUNNEL RELEASE Right 1988  . COLONOSCOPY N/A 02/11/2016   Procedure:  COLONOSCOPY;  Surgeon: Carol Ada, MD;  Location: [facility];  Service: Endoscopy;  Laterality: N/A;  . DILATION AND CURETTAGE OF UTERUS    . ESOPHAGOGASTRODUODENOSCOPY N/A 02/08/2017   Procedure: ESOPHAGOGASTRODUODENOSCOPY (EGD);  Surgeon: Carol Ada, MD;  Location: [facility];  Service: Endoscopy;  Laterality: N/A;  . EYE SURGERY Bilateral   . H/O MET Test w/PFT  04/02/2012   low risk; peak VO2 77% predicted  . IR GENERIC HISTORICAL  09/12/2016   IR FLUORO GUIDE PORT INSERTION RIGHT 09/12/2016 [name], MD [facility]  . IR GENERIC HISTORICAL  09/12/2016   IR US GUIDE VASC ACCESS RIGHT 09/12/2016 [name], MD [facility]  . JOINT REPLACEMENT  2003   thumb rt  . KNEE ARTHROSCOPY  12   rt meniscus  . MEDIASTINOSCOPY N/A 10/30/2013   Procedure: MEDIASTINOSCOPY;  Surgeon: Melrose Nakayama, MD;  Location: [facility];  Service: Thoracic;  Laterality: N/A;  . ROTATOR CUFF REPAIR  2006   ? side  . THYROIDECTOMY  1973  . TRANSTHORACIC ECHOCARDIOGRAM  09/25/2012   EF 55-60%; mild LVH & mild concentric hypertrophy; mild AV regurg; RV systolic pressure increase consistent with mild pulm HTN  . VIDEO ASSISTED THORACOSCOPY (VATS)/ LOBECTOMY Right 11/13/2013   Procedure: VIDEO ASSISTED THORACOSCOPY (VATS) with mediastinal  biopsies;  Surgeon: Melrose Nakayama, MD;  Location: [facility];  Service: Thoracic;  Laterality: Right;  RIGHT VATS,mediastinal biopsies  . VIDEO BRONCHOSCOPY Bilateral 09/25/2013   Procedure: VIDEO BRONCHOSCOPY WITHOUT FLUORO;  Surgeon: Tanda Rockers, MD;  Location: [facility];  Service: Cardiopulmonary;  Laterality: Bilateral;  . VIDEO BRONCHOSCOPY WITH ENDOBRONCHIAL ULTRASOUND N/A 10/30/2013   Procedure: VIDEO BRONCHOSCOPY WITH ENDOBRONCHIAL ULTRASOUND;  Surgeon: Melrose Nakayama, MD;  Location: [facility];  Service: Thoracic;  Laterality: N/A;    Family History  Problem Relation Age of Onset  . Lung cancer Mother   . Heart attack Father   . Heart failure  Maternal Grandfather   . Heart attack Paternal Grandfather     Social History:  reports that she quit smoking about 51 years ago. Her smoking use included cigarettes. She has a 3.00 pack-year smoking history. She has never used smokeless tobacco. She reports current alcohol use. She reports that she does not use drugs.  Allergies:  Allergies  Allergen Reactions  . Lactose Intolerance (Gi) Other (See Comments)  . Amiodarone Other (See Comments)    Corneal deposits  . Augmentin [Amoxicillin-Pot Clavulanate] Diarrhea    *ended up with cdiff* Has patient had a PCN reaction causing immediate rash, facial/tongue/throat swelling, SOB or lightheadedness with hypotension: No Has patient had a PCN reaction causing severe rash involving mucus membranes or skin necrosis: No Has patient had a PCN reaction that required hospitalization: Yes Has patient had a PCN reaction occurring within the last 10 years: Yes If all of the above answers are "NO", then may proceed with Cephalosporin use.   . Milk-Related Compounds Other (See Comments)    GI upset, projectile vomiting - can't take any dairy products  . Oxycontin  [Oxycodone Hcl] Other (See Comments)    Medications:  Scheduled:  Continuous: . sodium chloride    . lactated ringers      Results for orders placed or performed in visit on 03/20/19 (from the past 24 hour(s))  TSH     Status: None   Collection Time: 03/20/19 10:37 AM  Result Value Ref Range   TSH 2.857 0.308 - 3.960 uIU/mL  CMP (Cancer Center only)     Status: Abnormal   Collection Time: 03/20/19 10:37 AM  Result Value Ref Range   Sodium 133 (L) 135 - 145 mmol/L   Potassium 3.4 (L) 3.5 - 5.1 mmol/L   Chloride 90 (L) 98 - 111 mmol/L   CO2 33 (H) 22 - 32 mmol/L   Glucose, Bld 91 70 - 99 mg/dL   BUN 26 (H) 8 - 23 mg/dL   Creatinine 0.98 0.44 - 1.00 mg/dL   Calcium 9.3 8.9 - 10.3 mg/dL   Total Protein 7.0 6.5 - 8.1 g/dL   Albumin 3.7 3.5 - 5.0 g/dL   AST 17 15 - 41 U/L    ALT 17 0 - 44 U/L   Alkaline Phosphatase 133 (H) 38 - 126 U/L   Total Bilirubin 0.8 0.3 - 1.2 mg/dL   GFR, Est Non Af Am 56 (L) >60 mL/min   GFR, Est AFR Am >60 >60 mL/min   Anion gap 10 5 - 15  CBC with Differential (Cancer Center Only)     Status: Abnormal   Collection Time: 03/20/19 10:37 AM  Result Value Ref Range   WBC Count 16.1 (H) 4.0 - 10.5 K/uL   RBC 4.10 3.87 - 5.11 MIL/uL   Hemoglobin 12.4 12.0 - 15.0 g/dL   HCT 36.5  36.0 - 46.0 %   MCV 89.0 80.0 - 100.0 fL   MCH 30.2 26.0 - 34.0 pg   MCHC 34.0 30.0 - 36.0 g/dL   RDW 13.0 11.5 - 15.5 %   Platelet Count 392 150 - 400 K/uL   nRBC 0.0 0.0 - 0.2 %   Neutrophils Relative % 80 %   Neutro Abs 12.7 (H) 1.7 - 7.7 K/uL   Lymphocytes Relative 10 %   Lymphs Abs 1.6 0.7 - 4.0 K/uL   Monocytes Relative 8 %   Monocytes Absolute 1.3 (H) 0.1 - 1.0 K/uL   Eosinophils Relative 1 %   Eosinophils Absolute 0.2 0.0 - 0.5 K/uL   Basophils Relative 0 %   Basophils Absolute 0.0 0.0 - 0.1 K/uL   Immature Granulocytes 1 %   Abs Immature Granulocytes 0.17 (H) 0.00 - 0.07 K/uL     No results found.  ROS:  As stated above in the HPI otherwise negative.  There were no vitals taken for this visit.    PE: Gen: NAD, Alert and Oriented HEENT:  [place]/AT, EOMI Neck: Supple, no LAD Lungs: CTA Bilaterally CV: RRR without M/G/R ABM: Soft, NTND, +BS Ext: No C/C/E  Assessment/Plan: 1) Weight loss. 2) GERD.   She is still taking her pantoprazole BID, ever since the diagnosis of erosions for IDA. Her colonoscopy in 2017 was performed for complaints of diarrhea, but it was a negative work up. With her current symptoms and the findings of a 3 cm hiatal hernia, a repeat EGD is warranted.  Gerson Fauth D 03/21/2019, 10:33 AM

## 2019-03-21 NOTE — Telephone Encounter (Signed)
Scheduled appt per 7/16 los - pt to get an updated schedule next visit.

## 2019-03-21 NOTE — Discharge Instructions (Signed)

## 2019-03-21 NOTE — Op Note (Signed)
[facility] Patient Name: Kimberly [name] Procedure Date: 03/21/2019 MRN: [id number] Attending MD: Carol Ada , MD Date of Birth: [birth year] CSN: [id number] Age: 78 Admit Type: Outpatient Procedure:                Upper GI endoscopy Indications:              Heartburn, Weight loss Providers:                Carol Ada, MD, Baird Cancer, RN, Ashley Jacobs,                            RN, Cletis [place], Technician Referring MD:              Medicines:                Propofol per Anesthesia Complications:            No immediate complications. Estimated Blood Loss:     Estimated blood loss: none. Procedure:                Pre-Anesthesia Assessment:                           - Prior to the procedure, a History and Physical                            was performed, and patient medications and                            allergies were reviewed. The patient's tolerance of                            previous anesthesia was also reviewed. The risks                            and benefits of the procedure and the sedation                            options and risks were discussed with the patient.                            All questions were answered, and informed consent                            was obtained. Prior Anticoagulants: The patient has                            taken no previous anticoagulant or antiplatelet                            agents. ASA Grade Assessment: III - A patient with                            severe systemic disease. After reviewing the risks  and benefits, the patient was deemed in                            satisfactory condition to undergo the procedure.                           - Sedation was administered by an anesthesia                            professional. Deep sedation was attained.                           After obtaining informed consent, the endoscope was                            passed under direct  vision. Throughout the                            procedure, the patient's blood pressure, pulse, and                            oxygen saturations were monitored continuously. The                            GIF-H190 ([id number] Olympus gastroscope was                            introduced through the mouth, and advanced to the                            second part of duodenum. The upper GI endoscopy was                            accomplished without difficulty. The patient                            tolerated the procedure well. Scope In: Scope Out: Findings:      A 3 cm hiatal hernia was present.      The stomach was normal.      The examined duodenum was normal. Impression:               - 3 cm hiatal hernia.                           - Normal stomach.                           - Normal examined duodenum.                           - No specimens collected. Moderate Sedation:      Not Applicable - Patient had care per Anesthesia. Recommendation:           - Patient has a contact number available for  emergencies. The signs and symptoms of potential                            delayed complications were discussed with the                            patient. Return to normal activities tomorrow.                            Written discharge instructions were provided to the                            patient.                           - Resume previous diet.                           - Continue present medications.                           - Return to GI clinic in 4 weeks.                           - Trial of sucralfate. Procedure Code(s):        --- Professional ---                           [phone], Esophagogastroduodenoscopy, flexible,                            transoral; diagnostic, including collection of                            specimen(s) by brushing or washing, when performed                            (separate procedure) Diagnosis Code(s):         --- Professional ---                           K44.9, Diaphragmatic hernia without obstruction or                            gangrene                           R12, Heartburn                           R63.4, Abnormal weight loss CPT copyright 2019 [employer]. All rights reserved. The codes documented in this report are preliminary and upon coder review may  be revised to meet current compliance requirements. Carol Ada, MD Carol Ada, MD 03/21/2019 11:02:50 AM This report has been signed electronically. Number of Addenda: 0

## 2019-03-21 NOTE — Transfer of Care (Signed)
Immediate Anesthesia Transfer of Care Note  Patient: Kimberly [name]  Procedure(s) Performed: ESOPHAGOGASTRODUODENOSCOPY (EGD) WITH PROPOFOL (N/A )  Patient Location: PACU and Endoscopy Unit  Anesthesia Type:MAC  Level of Consciousness: awake, alert  and oriented  Airway & Oxygen Therapy: Patient Spontanous Breathing and Patient connected to nasal cannula oxygen  Post-op Assessment: Report given to RN and Post -op Vital signs reviewed and stable  Post vital signs: Reviewed and stable  Last Vitals:  Vitals Value Taken Time  BP 79/50 03/21/19 1105  Temp 36.3 C 03/21/19 1105  Pulse 76 03/21/19 1107  Resp 17 03/21/19 1107  SpO2 100 % 03/21/19 1107  Vitals shown include unvalidated device data.  Last Pain:  Vitals:   03/21/19 1105  TempSrc: Temporal  PainSc:          Complications: No apparent anesthesia complications

## 2019-03-21 NOTE — Anesthesia Procedure Notes (Signed)
Procedure Name: MAC Date/Time: 03/21/2019 10:48 AM Performed by: Eben Burow, CRNA Pre-anesthesia Checklist: Patient identified, Emergency Drugs available, Suction available, Patient being monitored and Timeout performed Oxygen Delivery Method: Nasal cannula Dental Injury: Teeth and Oropharynx as per pre-operative assessment

## 2019-03-24 ENCOUNTER — Encounter (HOSPITAL_COMMUNITY): Payer: Self-pay | Admitting: Gastroenterology

## 2019-03-26 ENCOUNTER — Other Ambulatory Visit: Payer: Self-pay

## 2019-03-26 NOTE — Patient Outreach (Signed)
[place] [facility]) Care Management  03/26/2019  Kimberly [name] 11/28/1940 [id number]    3rd attempt to outreach the patient.  No answer.  HIPAA compliant voicemail left with contact information.  Plan: RN Health Coach has made several attempts to outreach the patient..  No response to calls and letter in ten business days [place] [facility] will proceed with case closure.   [name] RN, BSN, [place] Direct Dial:  [phone]  Fax: [phone]

## 2019-03-28 ENCOUNTER — Ambulatory Visit: Payer: Self-pay

## 2019-04-08 ENCOUNTER — Other Ambulatory Visit: Payer: Self-pay

## 2019-04-08 NOTE — Patient Outreach (Signed)
[place] [facility]) Care Management  04/08/2019  PAYLIN HAILU 1940/09/29 [id number]    Multiple attempts to establish contact with the patient without success. No response from calls or letter mailed to patient.   Plan: Case is being closed at this time. RN Health Coach will send case closure to Physician and the patient.  [name] RN, BSN, [place] Direct Dial:  [phone]  Fax: [phone]

## 2019-04-14 DIAGNOSIS — R112 Nausea with vomiting, unspecified: Secondary | ICD-10-CM | POA: Diagnosis not present

## 2019-04-14 DIAGNOSIS — K449 Diaphragmatic hernia without obstruction or gangrene: Secondary | ICD-10-CM | POA: Diagnosis not present

## 2019-04-14 DIAGNOSIS — K219 Gastro-esophageal reflux disease without esophagitis: Secondary | ICD-10-CM | POA: Diagnosis not present

## 2019-04-17 ENCOUNTER — Other Ambulatory Visit: Payer: Self-pay

## 2019-04-17 ENCOUNTER — Inpatient Hospital Stay: Payer: Medicare Other

## 2019-04-17 ENCOUNTER — Encounter: Payer: Self-pay | Admitting: Internal Medicine

## 2019-04-17 ENCOUNTER — Inpatient Hospital Stay: Payer: Medicare Other | Attending: Internal Medicine | Admitting: Internal Medicine

## 2019-04-17 VITALS — BP 132/68 | HR 78 | Temp 98.9°F | Resp 18 | Ht 59.0 in | Wt 141.3 lb

## 2019-04-17 DIAGNOSIS — Z9221 Personal history of antineoplastic chemotherapy: Secondary | ICD-10-CM | POA: Insufficient documentation

## 2019-04-17 DIAGNOSIS — C3490 Malignant neoplasm of unspecified part of unspecified bronchus or lung: Secondary | ICD-10-CM | POA: Diagnosis not present

## 2019-04-17 DIAGNOSIS — Z5112 Encounter for antineoplastic immunotherapy: Secondary | ICD-10-CM | POA: Insufficient documentation

## 2019-04-17 DIAGNOSIS — I10 Essential (primary) hypertension: Secondary | ICD-10-CM | POA: Diagnosis not present

## 2019-04-17 DIAGNOSIS — E039 Hypothyroidism, unspecified: Secondary | ICD-10-CM | POA: Insufficient documentation

## 2019-04-17 DIAGNOSIS — E860 Dehydration: Secondary | ICD-10-CM

## 2019-04-17 DIAGNOSIS — I4891 Unspecified atrial fibrillation: Secondary | ICD-10-CM | POA: Diagnosis not present

## 2019-04-17 DIAGNOSIS — E785 Hyperlipidemia, unspecified: Secondary | ICD-10-CM | POA: Insufficient documentation

## 2019-04-17 DIAGNOSIS — J45909 Unspecified asthma, uncomplicated: Secondary | ICD-10-CM | POA: Diagnosis not present

## 2019-04-17 DIAGNOSIS — Z79899 Other long term (current) drug therapy: Secondary | ICD-10-CM | POA: Diagnosis not present

## 2019-04-17 DIAGNOSIS — C342 Malignant neoplasm of middle lobe, bronchus or lung: Secondary | ICD-10-CM

## 2019-04-17 DIAGNOSIS — Z7951 Long term (current) use of inhaled steroids: Secondary | ICD-10-CM | POA: Diagnosis not present

## 2019-04-17 DIAGNOSIS — J9611 Chronic respiratory failure with hypoxia: Secondary | ICD-10-CM | POA: Diagnosis not present

## 2019-04-17 DIAGNOSIS — R5383 Other fatigue: Secondary | ICD-10-CM

## 2019-04-17 DIAGNOSIS — Z923 Personal history of irradiation: Secondary | ICD-10-CM | POA: Diagnosis not present

## 2019-04-17 LAB — CBC WITH DIFFERENTIAL (CANCER CENTER ONLY)
Abs Immature Granulocytes: 0.06 10*3/uL (ref 0.00–0.07)
Basophils Absolute: 0.1 10*3/uL (ref 0.0–0.1)
Basophils Relative: 1 %
Eosinophils Absolute: 0.1 10*3/uL (ref 0.0–0.5)
Eosinophils Relative: 2 %
HCT: 31.8 % — ABNORMAL LOW (ref 36.0–46.0)
Hemoglobin: 10.6 g/dL — ABNORMAL LOW (ref 12.0–15.0)
Immature Granulocytes: 1 %
Lymphocytes Relative: 14 %
Lymphs Abs: 1.3 10*3/uL (ref 0.7–4.0)
MCH: 29.9 pg (ref 26.0–34.0)
MCHC: 33.3 g/dL (ref 30.0–36.0)
MCV: 89.8 fL (ref 80.0–100.0)
Monocytes Absolute: 0.8 10*3/uL (ref 0.1–1.0)
Monocytes Relative: 8 %
Neutro Abs: 7.2 10*3/uL (ref 1.7–7.7)
Neutrophils Relative %: 74 %
Platelet Count: 388 10*3/uL (ref 150–400)
RBC: 3.54 MIL/uL — ABNORMAL LOW (ref 3.87–5.11)
RDW: 13 % (ref 11.5–15.5)
WBC Count: 9.6 10*3/uL (ref 4.0–10.5)
nRBC: 0 % (ref 0.0–0.2)

## 2019-04-17 LAB — CMP (CANCER CENTER ONLY)
ALT: 13 U/L (ref 0–44)
AST: 14 U/L — ABNORMAL LOW (ref 15–41)
Albumin: 3.3 g/dL — ABNORMAL LOW (ref 3.5–5.0)
Alkaline Phosphatase: 151 U/L — ABNORMAL HIGH (ref 38–126)
Anion gap: 13 (ref 5–15)
BUN: 13 mg/dL (ref 8–23)
CO2: 28 mmol/L (ref 22–32)
Calcium: 9.5 mg/dL (ref 8.9–10.3)
Chloride: 92 mmol/L — ABNORMAL LOW (ref 98–111)
Creatinine: 0.94 mg/dL (ref 0.44–1.00)
GFR, Est AFR Am: >60 mL/min (ref >60)
GFR, Estimated: 59 mL/min — ABNORMAL LOW (ref >60)
Glucose, Bld: 87 mg/dL (ref 70–99)
Potassium: 3.6 mmol/L (ref 3.5–5.1)
Sodium: 133 mmol/L — ABNORMAL LOW (ref 135–145)
Total Bilirubin: 0.4 mg/dL (ref 0.3–1.2)
Total Protein: 6.9 g/dL (ref 6.5–8.1)

## 2019-04-17 LAB — TSH: TSH: 2.2 u[IU]/mL (ref 0.308–3.960)

## 2019-04-17 MED ORDER — ALTEPLASE 2 MG IJ SOLR
2.0000 mg | Freq: Once | INTRAMUSCULAR | Status: AC | PRN
  Administered 2019-04-17: 2 mg
  Filled 2019-04-17: qty 2

## 2019-04-17 MED ORDER — SODIUM CHLORIDE 0.9 % IV SOLN
480.0000 mg | Freq: Once | INTRAVENOUS | Status: AC
  Administered 2019-04-17: 12:00:00 480 mg via INTRAVENOUS
  Filled 2019-04-17: qty 48

## 2019-04-17 MED ORDER — HEPARIN SOD (PORK) LOCK FLUSH 100 UNIT/ML IV SOLN
500.0000 [IU] | Freq: Once | INTRAVENOUS | Status: AC | PRN
  Administered 2019-04-17: 12:00:00 500 [IU]
  Filled 2019-04-17: qty 5

## 2019-04-17 MED ORDER — SODIUM CHLORIDE 0.9% FLUSH
10.0000 mL | INTRAVENOUS | Status: DC | PRN
  Administered 2019-04-17: 10 mL
  Filled 2019-04-17: qty 10

## 2019-04-17 MED ORDER — ALTEPLASE 2 MG IJ SOLR
INTRAMUSCULAR | Status: AC
  Filled 2019-04-17: qty 2

## 2019-04-17 MED ORDER — SODIUM CHLORIDE 0.9 % IV SOLN
Freq: Once | INTRAVENOUS | Status: AC
  Administered 2019-04-17: 11:00:00 via INTRAVENOUS
  Filled 2019-04-17: qty 250

## 2019-04-17 MED ORDER — HEPARIN SOD (PORK) LOCK FLUSH 100 UNIT/ML IV SOLN
500.0000 [IU] | Freq: Once | INTRAVENOUS | Status: DC | PRN
  Filled 2019-04-17: qty 5

## 2019-04-17 NOTE — Progress Notes (Signed)
[facility] [phone]   Fax:([phone]) [place], MD [address] [place] [address]  DIAGNOSIS: Recurrent non-small cell lung cancer, squamous cell carcinoma initially diagnosed as unresectable a stage IIIa in February 2015.  PRIOR THERAPY: 1) Status post exploratory right VATS with mediastinal biopsy under the care of Dr. Roxan Hockey on 11/13/2013. The tumor was found to be unresectable at that time.[name] 2) Concurrent chemoradiation with weekly carboplatin for AUC of 2 and paclitaxel 45 mg/M2, status post 6 cycles with partial response. First cycle was given on 12/01/2013. 3) Consolidation chemotherapy with carboplatin for AUC of 5 and paclitaxel 175 mg/M2 every 3 weeks with Neulasta support. First dose 02/23/2014. Status post 3 cycles with partial response. 4) Systemic chemotherapy with carboplatin for AUC of 5 and paclitaxel 175 MG/M2 every 3 weeks with Neulasta support. Status post 6 cycles. First dose was given on 08/14/2016.  Last dose was giving 12/11/2016 with partial response.  CURRENT THERAPY: Second line treatment with immunotherapy with Nivolumab 480 mg IV every 4 weeks, first dose Jan 25, 2018.  Status post 16 cycles.  INTERVAL HISTORY: Kimberly [name] 78 y.o. female returns to the clinic today for follow-up visit.  The patient is feeling fine today with no concerning complaints except for the baseline shortness of breath and intermittent nausea.  She was seen by Dr. Benson Norway recently and underwent upper endoscopy that was unremarkable.  He started her on Carafate.  She denied having any current chest pain, cough or hemoptysis.  She denied having any fever or chills.  She continues to have intermittent nausea but no significant vomiting, diarrhea or constipation.  She has no headache or visual changes.  She is here today for evaluation before starting cycle #17.   MEDICAL HISTORY: Past Medical History:   Diagnosis Date  . Allergic asthma without acute exacerbation or status asthmaticus   . Aortic insufficiency   . Arthritis   . Atrial fibrillation ([place])   . Back pain 08/14/2016  . Bronchitis   . Cough    dry, endobronchial mass  . Dyslipidemia   . GERD (gastroesophageal reflux disease)   . Heart murmur   . History of blood transfusion   . History of bronchitis   . History of chemotherapy   . History of nuclear stress test 02/26/2006   exercise myoview; normal pattern of perfusion; low risk scan   . Hypertension   . Hypothyroidism   . Mitral insufficiency   . Pneumonia   . PONV (postoperative nausea and vomiting)   . Radiation 12/01/13-01/08/14   50.4 gray to right central chest  . Renal mass, left 04/12/2017  . Shortness of breath    with exertion  . Squamous cell carcinoma of lung ([place])   . Syncope    "states she has passed out a few times. dr is trying to find cause.last time when geeting ready to go home after video bronch/bx    ALLERGIES:  is allergic to lactose intolerance (gi); amiodarone; augmentin [amoxicillin-pot clavulanate]; milk-related compounds; and oxycontin  [oxycodone hcl].  MEDICATIONS:  Current Outpatient Medications  Medication Sig Dispense Refill  . sucralfate (CARAFATE) 1 g tablet Take 1 g by mouth 4 (four) times daily -  with meals and at bedtime.    [name] albuterol (VENTOLIN HFA) 108 (90 Base) MCG/ACT inhaler Inhale 1 puff into the lungs every 6 (six) hours as needed (before walking or heavy activity [try to  limit to twice daily]). 1 Inhaler 5  . atorvastatin (LIPITOR) 20 MG tablet Take 20 mg by mouth daily.     . fluticasone (FLONASE) 50 MCG/ACT nasal spray Place 2 sprays into both nostrils daily as needed (allergies.).     [name] levothyroxine (SYNTHROID, LEVOTHROID) 100 MCG tablet Take 100 mcg by mouth every evening.     . lidocaine-prilocaine (EMLA) cream Apply 1 application topically as needed. (Patient taking differently: Apply 1 application topically as  needed (for port access). ) 30 g 0  . metoprolol succinate (TOPROL-XL) 25 MG 24 hr tablet Take 1 tablet (25 mg total) by mouth daily. 90 tablet 3  . Mometasone Furoate (ASMANEX HFA) 100 MCG/ACT AERO Inhale 2 puffs into the lungs 2 (two) times daily. 39 g 3  . montelukast (SINGULAIR) 10 MG tablet Take 10 mg by mouth at bedtime.     . Multiple Vitamins-Iron (MULTIVITAMIN/IRON PO) Take 1 tablet by mouth daily with breakfast.     . pantoprazole (PROTONIX) 40 MG tablet TAKE 1 TABLET BY MOUTH  TWICE DAILY (Patient taking differently: Take 40 mg by mouth 2 (two) times daily. ) 120 tablet 0  . potassium chloride (KLOR-CON) 8 MEQ tablet Take 8 mEq by mouth every other day. In morning    . traMADol (ULTRAM) 50 MG tablet Take 25-50 mg by mouth every 8 (eight) hours as needed (severe pain.).   3   No current facility-administered medications for this visit.     SURGICAL HISTORY:  Past Surgical History:  Procedure Laterality Date  . CARDIAC CATHETERIZATION  07/08/2008   normal coronaries  . CARPAL TUNNEL RELEASE Right 1988  . COLONOSCOPY N/A 02/11/2016   Procedure: COLONOSCOPY;  Surgeon: Carol Ada, MD;  Location: [facility];  Service: Endoscopy;  Laterality: N/A;  . DILATION AND CURETTAGE OF UTERUS    . ESOPHAGOGASTRODUODENOSCOPY N/A 02/08/2017   Procedure: ESOPHAGOGASTRODUODENOSCOPY (EGD);  Surgeon: Carol Ada, MD;  Location: [facility];  Service: Endoscopy;  Laterality: N/A;  . ESOPHAGOGASTRODUODENOSCOPY (EGD) WITH PROPOFOL N/A 03/21/2019   Procedure: ESOPHAGOGASTRODUODENOSCOPY (EGD) WITH PROPOFOL;  Surgeon: Carol Ada, MD;  Location: [facility];  Service: Endoscopy;  Laterality: N/A;  . EYE SURGERY Bilateral   . H/O MET Test w/PFT  04/02/2012   low risk; peak VO2 77% predicted  . IR GENERIC HISTORICAL  09/12/2016   IR FLUORO GUIDE PORT INSERTION RIGHT 09/12/2016 [name], MD [facility]  . IR GENERIC HISTORICAL  09/12/2016   IR US GUIDE VASC ACCESS RIGHT 09/12/2016 [name], MD  [facility]  . JOINT REPLACEMENT  2003   thumb rt  . KNEE ARTHROSCOPY  12   rt meniscus  . MEDIASTINOSCOPY N/A 10/30/2013   Procedure: MEDIASTINOSCOPY;  Surgeon: Melrose Nakayama, MD;  Location: [facility];  Service: Thoracic;  Laterality: N/A;  . ROTATOR CUFF REPAIR  2006   ? side  . THYROIDECTOMY  1973  . TRANSTHORACIC ECHOCARDIOGRAM  09/25/2012   EF 55-60%; mild LVH & mild concentric hypertrophy; mild AV regurg; RV systolic pressure increase consistent with mild pulm HTN  . VIDEO ASSISTED THORACOSCOPY (VATS)/ LOBECTOMY Right 11/13/2013   Procedure: VIDEO ASSISTED THORACOSCOPY (VATS) with mediastinal  biopsies;  Surgeon: Melrose Nakayama, MD;  Location: [facility];  Service: Thoracic;  Laterality: Right;  RIGHT VATS,mediastinal biopsies  . VIDEO BRONCHOSCOPY Bilateral 09/25/2013   Procedure: VIDEO BRONCHOSCOPY WITHOUT FLUORO;  Surgeon: Tanda Rockers, MD;  Location: [facility];  Service: Cardiopulmonary;  Laterality: Bilateral;  . VIDEO BRONCHOSCOPY WITH ENDOBRONCHIAL ULTRASOUND  N/A 10/30/2013   Procedure: VIDEO BRONCHOSCOPY WITH ENDOBRONCHIAL ULTRASOUND;  Surgeon: Melrose Nakayama, MD;  Location: [facility];  Service: Thoracic;  Laterality: N/A;    REVIEW OF SYSTEMS:  A comprehensive review of systems was negative except for: Constitutional: positive for fatigue Respiratory: positive for dyspnea on exertion Gastrointestinal: positive for nausea   PHYSICAL EXAMINATION: General appearance: alert, cooperative, fatigued and no distress Head: Normocephalic, without obvious abnormality, atraumatic Neck: no adenopathy, no JVD, supple, symmetrical, trachea midline and thyroid not enlarged, symmetric, no tenderness/mass/nodules Lymph nodes: Cervical, supraclavicular, and axillary nodes normal. Resp: clear to auscultation bilaterally Back: symmetric, no curvature. ROM normal. No CVA tenderness. Cardio: regular rate and rhythm, S1, S2 normal, no murmur, click, rub or gallop GI: soft, non-tender;  bowel sounds normal; no masses,  no organomegaly Extremities: extremities normal, atraumatic, no cyanosis or edema  ECOG PERFORMANCE STATUS: 1 - Symptomatic but completely ambulatory  Blood pressure 132/68, pulse 78, temperature 98.9 F (37.2 C), resp. rate 18, height 4' 11" (1.499 m), weight 141 lb 4.8 oz (64.1 kg), SpO2 100 %.  LABORATORY DATA: Lab Results  Component Value Date   WBC 9.6 04/17/2019   HGB 10.6 (L) 04/17/2019   HCT 31.8 (L) 04/17/2019   MCV 89.8 04/17/2019   PLT 388 04/17/2019      Chemistry      Component Value Date/Time   NA 133 (L) 03/20/2019 1037   NA 140 07/12/2017 1212   K 3.4 (L) 03/20/2019 1037   K 3.5 07/12/2017 1212   CL 90 (L) 03/20/2019 1037   CO2 33 (H) 03/20/2019 1037   CO2 27 07/12/2017 1212   BUN 26 (H) 03/20/2019 1037   BUN 27.5 (H) 07/12/2017 1212   CREATININE 0.98 03/20/2019 1037   CREATININE 1.0 07/12/2017 1212      Component Value Date/Time   CALCIUM 9.3 03/20/2019 1037   CALCIUM 10.0 07/12/2017 1212   ALKPHOS 133 (H) 03/20/2019 1037   ALKPHOS 119 07/12/2017 1212   AST 17 03/20/2019 1037   AST 16 07/12/2017 1212   ALT 17 03/20/2019 1037   ALT 15 07/12/2017 1212   BILITOT 0.8 03/20/2019 1037   BILITOT 0.48 07/12/2017 1212       RADIOGRAPHIC STUDIES: No results found.   ASSESSMENT AND PLAN: This is a very pleasant 78 years old white female with recurrent non-small cell lung cancer, squamous cell carcinoma. The patient completed 6 cycles of systemic chemotherapy with carboplatin and paclitaxel and tolerated her treatment well except for fatigue as well as peripheral neuropathy. The patient has been in observation.  She continues to have persistent dry cough and shortness of breath. She had evidence for disease progression on restaging scan. The patient was started on treatment with second line immunotherapy with Nivolumab 480 mg IV every 4 weeks status post 16 cycles.  She continues to tolerate her treatment well with no  concerning adverse effects. I recommended for the patient to proceed with cycle #17 today as planned. I will see her back for follow-up visit in 4 weeks for evaluation with repeat CT scan of the chest, abdomen and pelvis for restaging of her disease. For the persistent nausea and vomiting, she was seen by Dr. Benson Norway.  He started her on Carafate. The patient was advised to call immediately if she has any concerning symptoms in the interval. The patient voices understanding of current disease status and treatment options and is in agreement with the current care plan. All questions were answered. The patient  knows to call the clinic with any problems, questions or concerns. We can certainly see the patient much sooner if necessary.   Disclaimer: This note was dictated with voice recognition software. Similar sounding words can inadvertently be transcribed and may not be corrected upon review.

## 2019-04-17 NOTE — Patient Instructions (Signed)
[facility] Discharge Instructions for Patients Receiving Chemotherapy  Today you received the following chemotherapy agents Opdivo  To help prevent nausea and vomiting after your treatment, we encourage you to take your nausea medication as directed   If you develop nausea and vomiting that is not controlled by your nausea medication, call the clinic.   BELOW ARE SYMPTOMS THAT SHOULD BE REPORTED IMMEDIATELY:  *FEVER GREATER THAN 100.5 F  *CHILLS WITH OR WITHOUT FEVER  NAUSEA AND VOMITING THAT IS NOT CONTROLLED WITH YOUR NAUSEA MEDICATION  *UNUSUAL SHORTNESS OF BREATH  *UNUSUAL BRUISING OR BLEEDING  TENDERNESS IN MOUTH AND THROAT WITH OR WITHOUT PRESENCE OF ULCERS  *URINARY PROBLEMS  *BOWEL PROBLEMS  UNUSUAL RASH Items with * indicate a potential emergency and should be followed up as soon as possible.  Feel free to call the clinic should you have any questions or concerns. The clinic phone number is [phone].  Please show the CHEMO ALERT CARD at check-in to the Emergency Department and triage nurse.   

## 2019-05-05 ENCOUNTER — Other Ambulatory Visit: Payer: Self-pay | Admitting: Pulmonary Disease

## 2019-05-14 ENCOUNTER — Encounter (HOSPITAL_COMMUNITY): Payer: Self-pay

## 2019-05-14 ENCOUNTER — Other Ambulatory Visit: Payer: Self-pay

## 2019-05-14 ENCOUNTER — Ambulatory Visit (HOSPITAL_COMMUNITY)
Admission: RE | Admit: 2019-05-14 | Discharge: 2019-05-14 | Disposition: A | Discharge status: Home / Self Care | Payer: Medicare Other | Source: Ambulatory Visit | Attending: Internal Medicine | Admitting: Internal Medicine

## 2019-05-14 DIAGNOSIS — D1722 Benign lipomatous neoplasm of skin and subcutaneous tissue of left arm: Secondary | ICD-10-CM | POA: Diagnosis not present

## 2019-05-14 DIAGNOSIS — J181 Lobar pneumonia, unspecified organism: Secondary | ICD-10-CM | POA: Diagnosis not present

## 2019-05-14 DIAGNOSIS — C3490 Malignant neoplasm of unspecified part of unspecified bronchus or lung: Secondary | ICD-10-CM | POA: Insufficient documentation

## 2019-05-14 DIAGNOSIS — K5730 Diverticulosis of large intestine without perforation or abscess without bleeding: Secondary | ICD-10-CM | POA: Diagnosis not present

## 2019-05-14 MED ORDER — SODIUM CHLORIDE (PF) 0.9 % IJ SOLN
INTRAMUSCULAR | Status: AC
  Filled 2019-05-14: qty 50

## 2019-05-14 MED ORDER — IOHEXOL 300 MG/ML  SOLN
100.0000 mL | Freq: Once | INTRAMUSCULAR | Status: AC | PRN
  Administered 2019-05-14: 12:00:00 100 mL via INTRAVENOUS

## 2019-05-14 MED ORDER — HEPARIN SOD (PORK) LOCK FLUSH 100 UNIT/ML IV SOLN
INTRAVENOUS | Status: AC
  Administered 2019-05-14: 500 [IU]
  Filled 2019-05-14: qty 5

## 2019-05-14 MED ORDER — HEPARIN SOD (PORK) LOCK FLUSH 100 UNIT/ML IV SOLN: 500.0000 [IU] | Freq: Once | INTRAVENOUS | Status: DC

## 2019-05-15 ENCOUNTER — Inpatient Hospital Stay: Payer: Medicare Other | Attending: Internal Medicine | Admitting: Physician Assistant

## 2019-05-15 ENCOUNTER — Inpatient Hospital Stay: Payer: Medicare Other

## 2019-05-15 ENCOUNTER — Encounter: Payer: Self-pay | Admitting: Physician Assistant

## 2019-05-15 ENCOUNTER — Other Ambulatory Visit: Payer: Self-pay | Admitting: Internal Medicine

## 2019-05-15 ENCOUNTER — Telehealth: Payer: Self-pay | Admitting: Physician Assistant

## 2019-05-15 ENCOUNTER — Other Ambulatory Visit: Payer: Self-pay

## 2019-05-15 VITALS — BP 137/78 | HR 101 | Temp 97.8°F | Resp 18 | Ht 59.0 in | Wt 139.2 lb

## 2019-05-15 DIAGNOSIS — Z79899 Other long term (current) drug therapy: Secondary | ICD-10-CM | POA: Diagnosis not present

## 2019-05-15 DIAGNOSIS — C342 Malignant neoplasm of middle lobe, bronchus or lung: Secondary | ICD-10-CM

## 2019-05-15 DIAGNOSIS — E039 Hypothyroidism, unspecified: Secondary | ICD-10-CM | POA: Diagnosis not present

## 2019-05-15 DIAGNOSIS — R5383 Other fatigue: Secondary | ICD-10-CM

## 2019-05-15 DIAGNOSIS — I10 Essential (primary) hypertension: Secondary | ICD-10-CM | POA: Diagnosis not present

## 2019-05-15 DIAGNOSIS — Z923 Personal history of irradiation: Secondary | ICD-10-CM | POA: Diagnosis not present

## 2019-05-15 DIAGNOSIS — E860 Dehydration: Secondary | ICD-10-CM

## 2019-05-15 DIAGNOSIS — Z7951 Long term (current) use of inhaled steroids: Secondary | ICD-10-CM | POA: Insufficient documentation

## 2019-05-15 DIAGNOSIS — Z5111 Encounter for antineoplastic chemotherapy: Secondary | ICD-10-CM | POA: Diagnosis not present

## 2019-05-15 DIAGNOSIS — G629 Polyneuropathy, unspecified: Secondary | ICD-10-CM | POA: Insufficient documentation

## 2019-05-15 DIAGNOSIS — Z9221 Personal history of antineoplastic chemotherapy: Secondary | ICD-10-CM | POA: Diagnosis not present

## 2019-05-15 DIAGNOSIS — Z23 Encounter for immunization: Secondary | ICD-10-CM | POA: Insufficient documentation

## 2019-05-15 DIAGNOSIS — J45909 Unspecified asthma, uncomplicated: Secondary | ICD-10-CM | POA: Insufficient documentation

## 2019-05-15 DIAGNOSIS — C3490 Malignant neoplasm of unspecified part of unspecified bronchus or lung: Secondary | ICD-10-CM

## 2019-05-15 DIAGNOSIS — E785 Hyperlipidemia, unspecified: Secondary | ICD-10-CM | POA: Diagnosis not present

## 2019-05-15 DIAGNOSIS — Z7189 Other specified counseling: Secondary | ICD-10-CM

## 2019-05-15 DIAGNOSIS — I4891 Unspecified atrial fibrillation: Secondary | ICD-10-CM | POA: Diagnosis not present

## 2019-05-15 LAB — CBC WITH DIFFERENTIAL (CANCER CENTER ONLY)
Abs Immature Granulocytes: 0.06 10*3/uL (ref 0.00–0.07)
Basophils Absolute: 0.1 10*3/uL (ref 0.0–0.1)
Basophils Relative: 1 %
Eosinophils Absolute: 0.3 10*3/uL (ref 0.0–0.5)
Eosinophils Relative: 3 %
HCT: 30.5 % — ABNORMAL LOW (ref 36.0–46.0)
Hemoglobin: 10.5 g/dL — ABNORMAL LOW (ref 12.0–15.0)
Immature Granulocytes: 1 %
Lymphocytes Relative: 9 %
Lymphs Abs: 1 10*3/uL (ref 0.7–4.0)
MCH: 31.2 pg (ref 26.0–34.0)
MCHC: 34.4 g/dL (ref 30.0–36.0)
MCV: 90.5 fL (ref 80.0–100.0)
Monocytes Absolute: 0.7 10*3/uL (ref 0.1–1.0)
Monocytes Relative: 6 %
Neutro Abs: 9 10*3/uL — ABNORMAL HIGH (ref 1.7–7.7)
Neutrophils Relative %: 80 %
Platelet Count: 380 10*3/uL (ref 150–400)
RBC: 3.37 MIL/uL — ABNORMAL LOW (ref 3.87–5.11)
RDW: 13.1 % (ref 11.5–15.5)
WBC Count: 11.2 10*3/uL — ABNORMAL HIGH (ref 4.0–10.5)
nRBC: 0 % (ref 0.0–0.2)

## 2019-05-15 LAB — CMP (CANCER CENTER ONLY)
ALT: 9 U/L (ref 0–44)
AST: 14 U/L — ABNORMAL LOW (ref 15–41)
Albumin: 3.2 g/dL — ABNORMAL LOW (ref 3.5–5.0)
Alkaline Phosphatase: 145 U/L — ABNORMAL HIGH (ref 38–126)
Anion gap: 11 (ref 5–15)
BUN: 12 mg/dL (ref 8–23)
CO2: 30 mmol/L (ref 22–32)
Calcium: 8.9 mg/dL (ref 8.9–10.3)
Chloride: 94 mmol/L — ABNORMAL LOW (ref 98–111)
Creatinine: 0.95 mg/dL (ref 0.44–1.00)
GFR, Est AFR Am: >60 mL/min (ref >60)
GFR, Estimated: 59 mL/min — ABNORMAL LOW (ref >60)
Glucose, Bld: 87 mg/dL (ref 70–99)
Potassium: 3.2 mmol/L — ABNORMAL LOW (ref 3.5–5.1)
Sodium: 135 mmol/L (ref 135–145)
Total Bilirubin: 0.5 mg/dL (ref 0.3–1.2)
Total Protein: 6.9 g/dL (ref 6.5–8.1)

## 2019-05-15 LAB — TSH: TSH: 1.317 u[IU]/mL (ref 0.308–3.960)

## 2019-05-15 MED ORDER — HEPARIN SOD (PORK) LOCK FLUSH 100 UNIT/ML IV SOLN
500.0000 [IU] | Freq: Once | INTRAVENOUS | Status: AC | PRN
  Administered 2019-05-15: 14:00:00 500 [IU]
  Filled 2019-05-15: qty 5

## 2019-05-15 MED ORDER — SODIUM CHLORIDE 0.9% FLUSH
10.0000 mL | INTRAVENOUS | Status: DC | PRN
  Administered 2019-05-15: 13:00:00 10 mL
  Filled 2019-05-15: qty 10

## 2019-05-15 MED ORDER — SODIUM CHLORIDE 0.9% FLUSH
10.0000 mL | INTRAVENOUS | Status: DC | PRN
  Administered 2019-05-15: 10 mL
  Filled 2019-05-15: qty 10

## 2019-05-15 NOTE — Telephone Encounter (Signed)
Scheduled appt per 9/10 los - gave patient AVS and calender per los.   

## 2019-05-15 NOTE — Progress Notes (Signed)
[place] OFFICE PROGRESS NOTE  Deland Pretty, MD [address] 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  PRIOR THERAPY:  1) Status post exploratory right VATS with mediastinal biopsy under the care of Dr. Roxan Hockey on 11/13/2013. The tumor was found to be unresectable at that time.[name] 2) Concurrent chemoradiation with weekly carboplatin for AUC of 2 and paclitaxel 45 mg/M2, status post 6 cycles with partial response. First cycle was given on 12/01/2013. 3) Consolidation chemotherapy with carboplatin for AUC of 5 and paclitaxel 175 mg/M2 every 3 weeks with Neulasta support. First dose 02/23/2014. Status post 3 cycles with partial response. 4) Systemic chemotherapy with carboplatin for AUC of 5 and paclitaxel 175 MG/M2 every 3 weeks with Neulasta support. Status post 6 cycles. First dose was given on 08/14/2016.  Last dose was giving 12/11/2016 with partial response. 5) Second line treatment with immunotherapy with Nivolumab 480 mg IV every 4 weeks, first dose Jan 25, 2018.  Status post 17 cycles.  CURRENT THERAPY: Systemic chemotherapy with gemcitabine 1000 mg/m on days 1 and 8 every 3 weeks.  First dose expected on 05/22/2019.   INTERVAL HISTORY: Kimberly [name] 78 y.o. female returns to the clinic today for a follow-up visit.  The patient is feeling fairly well today without any concerning complaints except for persistent nausea and vomiting.  The patient follows with Dr. Benson Norway from gastroenterology for this concern.  He recently performed an endoscopy which did not show any etiology for her nausea and vomiting.  She has appointment with him scheduled for next week on Monday. She has tried zofran, compazine, and carafate for control of her nausea and vomiting. The nausea is not associated or triggered by any particular events. She states this has  been occurring over the last 3-4 months.  Otherwise, she has been tolerating treatment with Nivolumab well without any concerning adverse effects.  She denies any fever, chills, night sweats, or weight loss.  He denies any chest pain.  She reports her baseline productive cough which produces clear sputum and baseline shortness of breath with exertion.  She denies any diarrhea, constipation, or abdominal pain.  She denies any headache or visual changes.  She denies any rashes or skin changes.  The patient recently had a restaging CT scan performed.  She is here today for evaluation and to review her scan results before starting cycle #18.  MEDICAL HISTORY: Past Medical History:  Diagnosis Date  . Allergic asthma without acute exacerbation or status asthmaticus   . Aortic insufficiency   . Arthritis   . Atrial fibrillation ([place])   . Back pain 08/14/2016  . Bronchitis   . Cough    dry, endobronchial mass  . Dyslipidemia   . GERD (gastroesophageal reflux disease)   . Heart murmur   . History of blood transfusion   . History of bronchitis   . History of chemotherapy   . History of nuclear stress test 02/26/2006   exercise myoview; normal pattern of perfusion; low risk scan   . Hypertension   . Hypothyroidism   . Mitral insufficiency   . Pneumonia   . PONV (postoperative nausea and vomiting)   . Radiation 12/01/13-01/08/14   50.4 gray to right central chest  . Renal mass, left 04/12/2017  . Shortness of breath    with exertion  . Squamous cell carcinoma of lung ([place])   .  Syncope    "states she has passed out a few times. dr is trying to find cause.last time when geeting ready to go home after video bronch/bx    ALLERGIES:  is allergic to lactose intolerance (gi); amiodarone; augmentin [amoxicillin-pot clavulanate]; milk-related compounds; and oxycontin  [oxycodone hcl].  MEDICATIONS:  Current Outpatient Medications  Medication Sig Dispense Refill  . albuterol (VENTOLIN HFA) 108 (90  Base) MCG/ACT inhaler Inhale 1 puff into the lungs every 6 (six) hours as needed (before walking or heavy activity [try to limit to twice daily]). 1 Inhaler 5  . atorvastatin (LIPITOR) 20 MG tablet Take 20 mg by mouth daily.     . fluticasone (FLONASE) 50 MCG/ACT nasal spray Place 2 sprays into both nostrils daily as needed (allergies.).     [name] levothyroxine (SYNTHROID, LEVOTHROID) 100 MCG tablet Take 100 mcg by mouth every evening.     . lidocaine-prilocaine (EMLA) cream Apply 1 application topically as needed. (Patient taking differently: Apply 1 application topically as needed (for port access). ) 30 g 0  . metoprolol succinate (TOPROL-XL) 25 MG 24 hr tablet Take 1 tablet (25 mg total) by mouth daily. 90 tablet 3  . Mometasone Furoate (ASMANEX HFA) 100 MCG/ACT AERO Inhale 2 puffs into the lungs 2 (two) times daily. 39 g 3  . montelukast (SINGULAIR) 10 MG tablet Take 10 mg by mouth at bedtime.     . Multiple Vitamins-Iron (MULTIVITAMIN/IRON PO) Take 1 tablet by mouth daily with breakfast.     . pantoprazole (PROTONIX) 40 MG tablet TAKE 1 TABLET BY MOUTH  TWICE DAILY 120 tablet 3  . potassium chloride (KLOR-CON) 8 MEQ tablet Take 8 mEq by mouth every other day. In morning    . sucralfate (CARAFATE) 1 g tablet Take 1 g by mouth 4 (four) times daily -  with meals and at bedtime.    . traMADol (ULTRAM) 50 MG tablet Take 25-50 mg by mouth every 8 (eight) hours as needed (severe pain.).   3   Current Facility-Administered Medications  Medication Dose Route Frequency Provider Last Rate Last Dose  . sodium chloride flush (NS) 0.9 % injection 10 mL  10 mL Intracatheter PRN Curt Bears, MD   10 mL at 05/15/19 1425    SURGICAL HISTORY:  Past Surgical History:  Procedure Laterality Date  . CARDIAC CATHETERIZATION  07/08/2008   normal coronaries  . CARPAL TUNNEL RELEASE Right 1988  . COLONOSCOPY N/A 02/11/2016   Procedure: COLONOSCOPY;  Surgeon: Carol Ada, MD;  Location: [facility];  Service:  Endoscopy;  Laterality: N/A;  . DILATION AND CURETTAGE OF UTERUS    . ESOPHAGOGASTRODUODENOSCOPY N/A 02/08/2017   Procedure: ESOPHAGOGASTRODUODENOSCOPY (EGD);  Surgeon: Carol Ada, MD;  Location: [facility];  Service: Endoscopy;  Laterality: N/A;  . ESOPHAGOGASTRODUODENOSCOPY (EGD) WITH PROPOFOL N/A 03/21/2019   Procedure: ESOPHAGOGASTRODUODENOSCOPY (EGD) WITH PROPOFOL;  Surgeon: Carol Ada, MD;  Location: [facility];  Service: Endoscopy;  Laterality: N/A;  . EYE SURGERY Bilateral   . H/O MET Test w/PFT  04/02/2012   low risk; peak VO2 77% predicted  . IR GENERIC HISTORICAL  09/12/2016   IR FLUORO GUIDE PORT INSERTION RIGHT 09/12/2016 [name], MD [facility]  . IR GENERIC HISTORICAL  09/12/2016   IR US GUIDE VASC ACCESS RIGHT 09/12/2016 [name], MD [facility]  . JOINT REPLACEMENT  2003   thumb rt  . KNEE ARTHROSCOPY  12   rt meniscus  . MEDIASTINOSCOPY N/A 10/30/2013   Procedure: MEDIASTINOSCOPY;  Surgeon: Remo Lipps  Chaya Jan, MD;  Location: [facility];  Service: Thoracic;  Laterality: N/A;  . ROTATOR CUFF REPAIR  2006   ? side  . THYROIDECTOMY  1973  . TRANSTHORACIC ECHOCARDIOGRAM  09/25/2012   EF 55-60%; mild LVH & mild concentric hypertrophy; mild AV regurg; RV systolic pressure increase consistent with mild pulm HTN  . VIDEO ASSISTED THORACOSCOPY (VATS)/ LOBECTOMY Right 11/13/2013   Procedure: VIDEO ASSISTED THORACOSCOPY (VATS) with mediastinal  biopsies;  Surgeon: Melrose Nakayama, MD;  Location: [facility];  Service: Thoracic;  Laterality: Right;  RIGHT VATS,mediastinal biopsies  . VIDEO BRONCHOSCOPY Bilateral 09/25/2013   Procedure: VIDEO BRONCHOSCOPY WITHOUT FLUORO;  Surgeon: Tanda Rockers, MD;  Location: [facility];  Service: Cardiopulmonary;  Laterality: Bilateral;  . VIDEO BRONCHOSCOPY WITH ENDOBRONCHIAL ULTRASOUND N/A 10/30/2013   Procedure: VIDEO BRONCHOSCOPY WITH ENDOBRONCHIAL ULTRASOUND;  Surgeon: Melrose Nakayama, MD;  Location: [facility];  Service:  Thoracic;  Laterality: N/A;    REVIEW OF SYSTEMS:   Review of Systems  Constitutional: Negative for appetite change, chills, fatigue, fever and unexpected weight change.  HENT: Negative for mouth sores, nosebleeds, sore throat and trouble swallowing.   Eyes: Negative for eye problems and icterus.  Respiratory: Positive for productive cough and shortness of breath with exertion. Negative for hemoptysis and wheezing.   Cardiovascular: Negative for chest pain and leg swelling.  Gastrointestinal: positive for frequent nausea and vomiting. Negative for abdominal pain, constipation, and diarrhea Genitourinary: Negative for bladder incontinence, difficulty urinating, dysuria, frequency and hematuria.   Musculoskeletal: Negative for back pain, gait problem, neck pain and neck stiffness.  Skin: Negative for itching and rash.  Neurological: Negative for dizziness, extremity weakness, gait problem, headaches, light-headedness and seizures.  Hematological: Negative for adenopathy. Does not bruise/bleed easily.  Psychiatric/Behavioral: Negative for confusion, depression and sleep disturbance. The patient is not nervous/anxious.     PHYSICAL EXAMINATION:  Blood pressure 137/78, pulse (!) 101, temperature 97.8 F (36.6 C), temperature source Oral, resp. rate 18, height '4\' 11"'  (1.499 m), weight 139 lb 3.2 oz (63.1 kg), SpO2 100 %.  ECOG PERFORMANCE STATUS: 1 - Symptomatic but completely ambulatory  Physical Exam  Constitutional: Oriented to person, place, and time and well-developed, well-nourished, and in no distress.  HENT:  Head: Normocephalic and atraumatic.  Mouth/Throat: Oropharynx is clear and moist. No oropharyngeal exudate.  Eyes: Conjunctivae are normal. Right eye exhibits no discharge. Left eye exhibits no discharge. No scleral icterus.  Neck: Normal range of motion. Neck supple.  Cardiovascular: Normal rate, irregular rhythm, normal heart sounds and intact distal pulses.    Pulmonary/Chest: Effort normal and breath sounds normal. No respiratory distress. No wheezes. No rales.  Abdominal: Soft. Bowel sounds are normal. Exhibits no distension and no mass. There is no tenderness.  Musculoskeletal: Normal range of motion. Exhibits no edema.  Lymphadenopathy:    No cervical adenopathy.  Neurological: Alert and oriented to person, place, and time. Exhibits normal muscle tone. Gait normal. Coordination normal.  Skin: Skin is warm and dry. No rash noted. Not diaphoretic. No erythema. No pallor.  Psychiatric: Mood, memory and judgment normal.  Vitals reviewed.  LABORATORY DATA: Lab Results  Component Value Date   WBC 11.2 (H) 05/15/2019   HGB 10.5 (L) 05/15/2019   HCT 30.5 (L) 05/15/2019   MCV 90.5 05/15/2019   PLT 380 05/15/2019      Chemistry      Component Value Date/Time   NA 135 05/15/2019 1242   NA 140 07/12/2017 1212   K 3.2 (  L) 05/15/2019 1242   K 3.5 07/12/2017 1212   CL 94 (L) 05/15/2019 1242   CO2 30 05/15/2019 1242   CO2 27 07/12/2017 1212   BUN 12 05/15/2019 1242   BUN 27.5 (H) 07/12/2017 1212   CREATININE 0.95 05/15/2019 1242   CREATININE 1.0 07/12/2017 1212      Component Value Date/Time   CALCIUM 8.9 05/15/2019 1242   CALCIUM 10.0 07/12/2017 1212   ALKPHOS 145 (H) 05/15/2019 1242   ALKPHOS 119 07/12/2017 1212   AST 14 (L) 05/15/2019 1242   AST 16 07/12/2017 1212   ALT 9 05/15/2019 1242   ALT 15 07/12/2017 1212   BILITOT 0.5 05/15/2019 1242   BILITOT 0.48 07/12/2017 1212       RADIOGRAPHIC STUDIES:  Ct Chest W Contrast  Result Date: 05/14/2019 CLINICAL DATA:  Non-small cell lung cancer restaging. Ongoing immunotherapy. Chemotherapy and radiation therapy completed. EXAM: CT CHEST, ABDOMEN, AND PELVIS WITH CONTRAST TECHNIQUE: Multidetector CT imaging of the chest, abdomen and pelvis was performed following the standard protocol during bolus administration of intravenous contrast. CONTRAST:  143m OMNIPAQUE IOHEXOL 300 MG/ML   SOLN COMPARISON:  Multiple exams, including 02/18/2019. FINDINGS: CT CHEST FINDINGS Cardiovascular: Right Port-A-Cath tip: SVC. Coronary, aortic arch, and branch vessel atherosclerotic vascular disease. Mediastinum/Nodes: Thyroidectomy. Rightward mediastinal shift. Right upper paratracheal node 0.7 cm in short axis on image 9/2. Subcarinal node 1.0 cm in short axis, 22/2. Small lower paraesophageal lymph nodes are present. Lungs/Pleura: Centrally necrotic right middle lobe lung mass measures 6.7 by 5.3 by 3.4 cm (volume = 63 cm^3) on image 26/2, formerly by my measurements 5.9 by 4.9 by 2.9 cm (volume = 44 cm^3). This lesion invades the fourth intercostal space and the fourth and fifth ribs, a significant worsening from the prior exam. Adjacent pleural thickening noted. Consolidation posteriorly in the right upper lobe, similar to prior, with associated air bronchograms. There is also some consolidation along the margins of the right middle lobe mass, primarily in the anterior segment right lower lobe and in the right middle lobe, much of which may be from prior radiation therapy. A left lower lobe pulmonary nodule measuring 0.7 cm in diameter on image 80/6 is similar to the prior exam and has a large central calcification, probably a granuloma or pulmonary hamartoma. A separate left lower lobe 3 mm granuloma is present on image 90/6. A subpleural nodule measuring 0.4 by 0.3 cm is stable on image 82/6. No new left-sided pulmonary nodules are identified. Musculoskeletal: As noted above, there is bony erosion of the right fourth and fifth ribs which is new/increased due to adjacent tumor also invading the fourth intercostal space. Lipoma along the left infraspinatus/teres minor muscles. CT ABDOMEN PELVIS FINDINGS Hepatobiliary: Unremarkable Pancreas: Unremarkable Spleen: Unremarkable Adrenals/Urinary Tract: Both adrenal glands appear normal. Complex 1.6 by 1.2 by 1.3 cm lesion of the right kidney upper pole has a band  of accentuated density internally and this band could be significantly enhancing, or may simply represent blood products, for example on image 49/5. Simple appearing right kidney lower pole cyst. Stomach/Bowel: Thickening in the stomach antrum could be from antritis or simply from contraction. Normal appendix. Sigmoid colon diverticulosis. Vascular/Lymphatic: Aortoiliac atherosclerotic vascular disease. Reproductive: Unremarkable Other: No supplemental non-categorized findings. Musculoskeletal: Stable considerable lipoma along the right sartorius muscle. This is only partially included on today's exam. Grade 1 degenerative anterolisthesis at L3-4 and L4-5 along with spondylosis and degenerative disc disease most notable at at L2-3. Impingement noted at L3-4 and L4-5.  IMPRESSION: 1. The right middle lobe mass is increased in volume by 43% compared to the examination from 3 months ago. There is new invasion of the right chest wall in the fourth intercostal space with associated erosions of the fourth and fifth ribs. 2. Stable regions of consolidation likely primarily from post radiation therapy findings in the right lung. 3. No overt thoracic adenopathy or findings of distant metastatic spread at this time. 4. A complex 1.6 by 1.2 by 1.3 cm cystic lesion of the right kidney upper pole posteriorly has a band of higher density centrally. This could reflect blood products or proteinaceous fluid, but I cannot completely exclude enhancement of this band or renal cell carcinoma. Renal protocol CT with and without contrast would probably be more effective than MRI in further characterization given the overall configuration. 5. Other imaging findings of potential clinical significance: Aortic Atherosclerosis (ICD10-I70.0). Coronary atherosclerosis. Stable left lower lobe nodules are likely benign based on calcifications and size, but merit surveillance. Lipomas of the left infraspinatus muscle and of the right sartorius  muscle. Sigmoid colon diverticulosis. Impingement at L3-4 and L4-5. Electronically Signed   By: Van Clines M.D.   On: 05/14/2019 13:02   Ct Abdomen Pelvis W Contrast  Result Date: 05/14/2019 CLINICAL DATA:  Non-small cell lung cancer restaging. Ongoing immunotherapy. Chemotherapy and radiation therapy completed. EXAM: CT CHEST, ABDOMEN, AND PELVIS WITH CONTRAST TECHNIQUE: Multidetector CT imaging of the chest, abdomen and pelvis was performed following the standard protocol during bolus administration of intravenous contrast. CONTRAST:  152m OMNIPAQUE IOHEXOL 300 MG/ML  SOLN COMPARISON:  Multiple exams, including 02/18/2019. FINDINGS: CT CHEST FINDINGS Cardiovascular: Right Port-A-Cath tip: SVC. Coronary, aortic arch, and branch vessel atherosclerotic vascular disease. Mediastinum/Nodes: Thyroidectomy. Rightward mediastinal shift. Right upper paratracheal node 0.7 cm in short axis on image 9/2. Subcarinal node 1.0 cm in short axis, 22/2. Small lower paraesophageal lymph nodes are present. Lungs/Pleura: Centrally necrotic right middle lobe lung mass measures 6.7 by 5.3 by 3.4 cm (volume = 63 cm^3) on image 26/2, formerly by my measurements 5.9 by 4.9 by 2.9 cm (volume = 44 cm^3). This lesion invades the fourth intercostal space and the fourth and fifth ribs, a significant worsening from the prior exam. Adjacent pleural thickening noted. Consolidation posteriorly in the right upper lobe, similar to prior, with associated air bronchograms. There is also some consolidation along the margins of the right middle lobe mass, primarily in the anterior segment right lower lobe and in the right middle lobe, much of which may be from prior radiation therapy. A left lower lobe pulmonary nodule measuring 0.7 cm in diameter on image 80/6 is similar to the prior exam and has a large central calcification, probably a granuloma or pulmonary hamartoma. A separate left lower lobe 3 mm granuloma is present on image 90/6. A  subpleural nodule measuring 0.4 by 0.3 cm is stable on image 82/6. No new left-sided pulmonary nodules are identified. Musculoskeletal: As noted above, there is bony erosion of the right fourth and fifth ribs which is new/increased due to adjacent tumor also invading the fourth intercostal space. Lipoma along the left infraspinatus/teres minor muscles. CT ABDOMEN PELVIS FINDINGS Hepatobiliary: Unremarkable Pancreas: Unremarkable Spleen: Unremarkable Adrenals/Urinary Tract: Both adrenal glands appear normal. Complex 1.6 by 1.2 by 1.3 cm lesion of the right kidney upper pole has a band of accentuated density internally and this band could be significantly enhancing, or may simply represent blood products, for example on image 49/5. Simple appearing right kidney lower pole  cyst. Stomach/Bowel: Thickening in the stomach antrum could be from antritis or simply from contraction. Normal appendix. Sigmoid colon diverticulosis. Vascular/Lymphatic: Aortoiliac atherosclerotic vascular disease. Reproductive: Unremarkable Other: No supplemental non-categorized findings. Musculoskeletal: Stable considerable lipoma along the right sartorius muscle. This is only partially included on today's exam. Grade 1 degenerative anterolisthesis at L3-4 and L4-5 along with spondylosis and degenerative disc disease most notable at at L2-3. Impingement noted at L3-4 and L4-5. IMPRESSION: 1. The right middle lobe mass is increased in volume by 43% compared to the examination from 3 months ago. There is new invasion of the right chest wall in the fourth intercostal space with associated erosions of the fourth and fifth ribs. 2. Stable regions of consolidation likely primarily from post radiation therapy findings in the right lung. 3. No overt thoracic adenopathy or findings of distant metastatic spread at this time. 4. A complex 1.6 by 1.2 by 1.3 cm cystic lesion of the right kidney upper pole posteriorly has a band of higher density centrally.  This could reflect blood products or proteinaceous fluid, but I cannot completely exclude enhancement of this band or renal cell carcinoma. Renal protocol CT with and without contrast would probably be more effective than MRI in further characterization given the overall configuration. 5. Other imaging findings of potential clinical significance: Aortic Atherosclerosis (ICD10-I70.0). Coronary atherosclerosis. Stable left lower lobe nodules are likely benign based on calcifications and size, but merit surveillance. Lipomas of the left infraspinatus muscle and of the right sartorius muscle. Sigmoid colon diverticulosis. Impingement at L3-4 and L4-5. Electronically Signed   By: Van Clines M.D.   On: 05/14/2019 13:02    ASSESSMENT/PLAN:  This is a very pleasant 78 year old Caucasian female with recurrent non-small cell lung cancer, squamouse cell carcinoma.  She was diagnosed in February 2015.  She presented with a right middle lobe lung mass and subcarinal lymphadenopathy.   She completed 6 cycles of systemic chemotherapy with carboplatin and paclitaxel.  She tolerated it well except for fatigue and peripheral neuropathy. She was on observation but showed evidence of disease progression.   She is currently undergoing second line immunotherapy with nivolumab 480 mg IV every 4 weeks.  She is status post 17 cycles.  She is tolerating well without any adverse side effects.  The patient recently had a restaging CT scan performed.  Dr. Julien Nordmann personally and independently reviewed the scan and discussed the results with the patient today.  The scan unfortunately showed evidence of disease progression with an increase of the right middle lobe mass with invasion into the right chest wall in the fourth intercostal space with erosion of the fourth and fifth ribs.   Dr. Julien Nordmann had a lengthy discussion with the patient today about her current condition and treatment options.  She is given the option of  starting systemic chemotherapy with docetaxel and Cyramza versus single agent chemotherapy with gemcitabine 1000 mg/m2 on days 1 and 8 every 3 weeks.   The patient is interested in single agent gemcitabine. She is expected to start her first dose on 05/22/2019.   We discussed the adverse side effects of treatment including but not limited to alopecia, nausea, vomiting, myelosuppression, and peripheral neuropathy.   We will see the patient back for a follow up visit in 4 weeks for evaluation for starting cycle #2 of treatment.  She will follow up with Dr. Benson Norway on Monday as scheduled for her nausea/vomiting.   The patient's potassium was noted to be a little low today at  3.2. The patient was advised to increase the potassium intake in her diet.   The patient was advised to call immediately if she has any concerning symptoms in the interval. The patient voices understanding of current disease status and treatment options and is in agreement with the current care plan. All questions were answered. The patient knows to call the clinic with any problems, questions or concerns. We can certainly see the patient much sooner if necessary  Orders Placed This Encounter  Procedures  . CMP ([facility] only)    Standing Status:   Standing    Number of Occurrences:   15    Standing Expiration Date:   05/14/2020  . CBC with Differential ([facility]cer Center Only)    Standing Status:   Standing    Number of Occurrences:   15    Standing Expiration Date:   05/14/2020  . SCHEDULING COMMUNICATION    Please schedule 2 hour infusion visit for fluids and hydration     Kimberly L Heilingoetter, PA-C 05/15/19  ADDENDUM: Hematology/Oncology Attending: I had a face-to-face encounter with the patient today.  I recommended her care plan.  This is a very pleasant 78 years old white female with recurrent non-small cell lung cancer, squamous cell carcinoma that was initially diagnosed as stage IIIa in February 2015.   She is status post previous concurrent chemoradiation followed by consolidation chemotherapy.  She then developed recurrence of her disease and started on systemic chemotherapy with carboplatin and paclitaxel for 6 cycles followed by observation.  The patient then developed disease progression and she started on treatment with immunotherapy with nivolumab 480 mg IV every 4 weeks status post 17 months of treatment.  She has been tolerating this treatment well with no concerning adverse effects. She had repeat CT scan of the chest, abdomen pelvis performed recently.  I personally and independently reviewed the scan images and discussed the results with the patient today. Her scan showed clear evidence for disease progression and enlargement of the right upper lobe lung mass and based on the radiology report there was like 43% volume increase compared to the previous scan. I recommended for the patient to discontinue her current treatment with immunotherapy with nivolumab at this point. I discussed with her other treatment options including palliative care versus systemic chemotherapy with single agent gemcitabine versus treatment with docetaxel and Cyramza. The patient is concerned about the adverse effect of docetaxel and Cyramza at this point and would like to proceed with the treatment with gemcitabine.  She will be treated with gemcitabine 1000 mg/M2 on days 1 and 8 every 3 weeks. I discussed the adverse effect of this treatment with the patient including but not limited to mild alopecia, myelosuppression, nausea and vomiting as well as liver or renal insufficiency. She is expected to start the first cycle of this treatment next week. She will come back for follow-up visit in 4 weeks with the start of cycle #2. She was advised to call immediately if she has any concerning symptoms in the interval.  Disclaimer: This note was dictated with voice recognition software. Similar sounding words can  inadvertently be transcribed and may be missed upon review. Eilleen Kempf, MD 05/15/19

## 2019-05-15 NOTE — Progress Notes (Signed)
DISCONTINUE ON PATHWAY REGIMEN - Non-Small Cell Lung     A cycle is every 28 days:     Nivolumab   **Always confirm dose/schedule in your pharmacy ordering system**  REASON: Disease Progression PRIOR TREATMENT: [id number]: Nivolumab 480 mg q28 Days Until Progression or Unacceptable Toxicity TREATMENT RESPONSE: Progressive Disease (PD)  CLINICAL TRIAL SELECTED - Non-Small Cell Lung  Other Clinical Trial: gemcit  **Trial eligibility and accrual should be confirmed by your research team**  Patient Characteristics: Stage IV Metastatic, Squamous, PS = 0, 1, Third Line, Prior PD-1/PD-L1 Inhibitor or No Prior PD-1/PD-L1 Inhibitor and Not a Candidate for Immunotherapy AJCC T Category: T3 Current Disease Status: Distant Metastases AJCC N Category: N2 AJCC M Category: M1a AJCC 8 Stage Grouping: IVA Histology: Squamous Cell Line of therapy: Third Engineer, maintenance (IT) Status: 1 PD-L1 Expression Status: Did Not Order Test Immunotherapy Candidate Status: Not a Candidate for Immunotherapy Prior Immunotherapy Status: Prior PD-1/PD-L1 Inhibitor Intent of Therapy: Non-Curative / Palliative Intent, Discussed with Patient

## 2019-05-15 NOTE — Patient Instructions (Addendum)
-  The new chemotherapy drug is called Gemcitabine (Gemzar). This treatment is given two weeks in  in a row with one week off in between.  -The plan is to start next week around 9/17.  -We will see you back in 4 weeks when you return for cycle #2.  -Our office number is [phone]

## 2019-05-18 DIAGNOSIS — J9611 Chronic respiratory failure with hypoxia: Secondary | ICD-10-CM | POA: Diagnosis not present

## 2019-05-18 DIAGNOSIS — C3490 Malignant neoplasm of unspecified part of unspecified bronchus or lung: Secondary | ICD-10-CM | POA: Diagnosis not present

## 2019-05-19 ENCOUNTER — Telehealth: Payer: Self-pay | Admitting: Medical Oncology

## 2019-05-19 DIAGNOSIS — C3490 Malignant neoplasm of unspecified part of unspecified bronchus or lung: Secondary | ICD-10-CM | POA: Diagnosis not present

## 2019-05-19 DIAGNOSIS — R112 Nausea with vomiting, unspecified: Secondary | ICD-10-CM | POA: Diagnosis not present

## 2019-05-19 NOTE — Telephone Encounter (Signed)
Reviewed tx plan and side effects with dtr. Dtr is Pharmacist, hospital and would like pt to received home health  Nursing evaluation and treatment for side effects from new Chemotherapy .Will ask [name] to evaluate need at next appt.

## 2019-05-20 ENCOUNTER — Telehealth: Payer: Self-pay | Admitting: *Deleted

## 2019-05-20 NOTE — Telephone Encounter (Signed)
Received vm message from pt stating that she had trouble with constipation last night and had some bleeding from her rectum afterwards.  [name] and spoke with her. She states she had been constipated but finally had a bowel movement last night. She states it was very large and very hard.  She has had some softer stools after that with some bleeding.  Small amount of blod in the commode and and some on the tissue. 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  She also states that she vomites all of her 3 meals yesterday. She did see Dr. Benson Norway yesterday-her GI doctor. Advised patient to continue to monitor rectal bleeding. If it continues, as well as the nausea/vomiting-she will need to contact her GI doctor.  She does has prochlorperazine @ home for nausea (left over from prior chemotherapy and not currently on her med list).

## 2019-05-22 ENCOUNTER — Inpatient Hospital Stay: Payer: Medicare Other

## 2019-05-22 ENCOUNTER — Other Ambulatory Visit: Payer: Self-pay | Admitting: *Deleted

## 2019-05-22 ENCOUNTER — Other Ambulatory Visit: Payer: Self-pay

## 2019-05-22 ENCOUNTER — Telehealth: Payer: Self-pay | Admitting: *Deleted

## 2019-05-22 VITALS — BP 135/80 | HR 85 | Temp 98.0°F | Resp 18

## 2019-05-22 DIAGNOSIS — C3490 Malignant neoplasm of unspecified part of unspecified bronchus or lung: Secondary | ICD-10-CM

## 2019-05-22 DIAGNOSIS — Z7951 Long term (current) use of inhaled steroids: Secondary | ICD-10-CM | POA: Diagnosis not present

## 2019-05-22 DIAGNOSIS — Z923 Personal history of irradiation: Secondary | ICD-10-CM | POA: Diagnosis not present

## 2019-05-22 DIAGNOSIS — Z79899 Other long term (current) drug therapy: Secondary | ICD-10-CM | POA: Diagnosis not present

## 2019-05-22 DIAGNOSIS — G629 Polyneuropathy, unspecified: Secondary | ICD-10-CM | POA: Diagnosis not present

## 2019-05-22 DIAGNOSIS — Z23 Encounter for immunization: Secondary | ICD-10-CM

## 2019-05-22 DIAGNOSIS — I10 Essential (primary) hypertension: Secondary | ICD-10-CM | POA: Diagnosis not present

## 2019-05-22 DIAGNOSIS — I4891 Unspecified atrial fibrillation: Secondary | ICD-10-CM | POA: Diagnosis not present

## 2019-05-22 DIAGNOSIS — E860 Dehydration: Secondary | ICD-10-CM

## 2019-05-22 DIAGNOSIS — E039 Hypothyroidism, unspecified: Secondary | ICD-10-CM | POA: Diagnosis not present

## 2019-05-22 DIAGNOSIS — Z5111 Encounter for antineoplastic chemotherapy: Secondary | ICD-10-CM | POA: Diagnosis not present

## 2019-05-22 DIAGNOSIS — Z9221 Personal history of antineoplastic chemotherapy: Secondary | ICD-10-CM | POA: Diagnosis not present

## 2019-05-22 DIAGNOSIS — E785 Hyperlipidemia, unspecified: Secondary | ICD-10-CM | POA: Diagnosis not present

## 2019-05-22 DIAGNOSIS — J45909 Unspecified asthma, uncomplicated: Secondary | ICD-10-CM | POA: Diagnosis not present

## 2019-05-22 DIAGNOSIS — C342 Malignant neoplasm of middle lobe, bronchus or lung: Secondary | ICD-10-CM | POA: Diagnosis not present

## 2019-05-22 LAB — CMP (CANCER CENTER ONLY)
ALT: 8 U/L (ref 0–44)
AST: 15 U/L (ref 15–41)
Albumin: 3.3 g/dL — ABNORMAL LOW (ref 3.5–5.0)
Alkaline Phosphatase: 128 U/L — ABNORMAL HIGH (ref 38–126)
Anion gap: 11 (ref 5–15)
BUN: 13 mg/dL (ref 8–23)
CO2: 32 mmol/L (ref 22–32)
Calcium: 9.2 mg/dL (ref 8.9–10.3)
Chloride: 89 mmol/L — ABNORMAL LOW (ref 98–111)
Creatinine: 0.99 mg/dL (ref 0.44–1.00)
GFR, Est AFR Am: >60 mL/min (ref >60)
GFR, Estimated: 56 mL/min — ABNORMAL LOW (ref >60)
Glucose, Bld: 93 mg/dL (ref 70–99)
Potassium: 3 mmol/L — CL (ref 3.5–5.1)
Sodium: 132 mmol/L — ABNORMAL LOW (ref 135–145)
Total Bilirubin: 0.5 mg/dL (ref 0.3–1.2)
Total Protein: 6.9 g/dL (ref 6.5–8.1)

## 2019-05-22 LAB — CBC WITH DIFFERENTIAL (CANCER CENTER ONLY)
Abs Immature Granulocytes: 0.07 10*3/uL (ref 0.00–0.07)
Basophils Absolute: 0.1 10*3/uL (ref 0.0–0.1)
Basophils Relative: 1 %
Eosinophils Absolute: 0.3 10*3/uL (ref 0.0–0.5)
Eosinophils Relative: 2 %
HCT: 30.4 % — ABNORMAL LOW (ref 36.0–46.0)
Hemoglobin: 10.5 g/dL — ABNORMAL LOW (ref 12.0–15.0)
Immature Granulocytes: 1 %
Lymphocytes Relative: 11 %
Lymphs Abs: 1.4 10*3/uL (ref 0.7–4.0)
MCH: 30.3 pg (ref 26.0–34.0)
MCHC: 34.5 g/dL (ref 30.0–36.0)
MCV: 87.9 fL (ref 80.0–100.0)
Monocytes Absolute: 0.9 10*3/uL (ref 0.1–1.0)
Monocytes Relative: 7 %
Neutro Abs: 10.1 10*3/uL — ABNORMAL HIGH (ref 1.7–7.7)
Neutrophils Relative %: 78 %
Platelet Count: 411 10*3/uL — ABNORMAL HIGH (ref 150–400)
RBC: 3.46 MIL/uL — ABNORMAL LOW (ref 3.87–5.11)
RDW: 13.1 % (ref 11.5–15.5)
WBC Count: 12.7 10*3/uL — ABNORMAL HIGH (ref 4.0–10.5)
nRBC: 0 % (ref 0.0–0.2)

## 2019-05-22 MED ORDER — SODIUM CHLORIDE 0.9% FLUSH
10.0000 mL | INTRAVENOUS | Status: DC | PRN
  Administered 2019-05-22: 10 mL
  Filled 2019-05-22: qty 10

## 2019-05-22 MED ORDER — INFLUENZA VAC A&B SA ADJ QUAD 0.5 ML IM PRSY
PREFILLED_SYRINGE | INTRAMUSCULAR | Status: AC
  Filled 2019-05-22: qty 0.5

## 2019-05-22 MED ORDER — SODIUM CHLORIDE 0.9 % IV SOLN
Freq: Once | INTRAVENOUS | Status: AC
  Administered 2019-05-22: 14:00:00 via INTRAVENOUS
  Filled 2019-05-22: qty 250

## 2019-05-22 MED ORDER — INFLUENZA VAC A&B SA ADJ QUAD 0.5 ML IM PRSY
0.5000 mL | PREFILLED_SYRINGE | Freq: Once | INTRAMUSCULAR | Status: AC
  Administered 2019-05-22: 0.5 mL via INTRAMUSCULAR

## 2019-05-22 MED ORDER — SODIUM CHLORIDE 0.9 % IV SOLN
1000.0000 mg/m2 | Freq: Once | INTRAVENOUS | Status: AC
  Administered 2019-05-22: 1634 mg via INTRAVENOUS
  Filled 2019-05-22: qty 42.98

## 2019-05-22 MED ORDER — PROCHLORPERAZINE MALEATE 10 MG PO TABS
10.0000 mg | ORAL_TABLET | Freq: Once | ORAL | Status: AC
  Administered 2019-05-22: 10 mg via ORAL

## 2019-05-22 MED ORDER — POTASSIUM CHLORIDE ER 10 MEQ PO TBCR: 20.0000 meq | EXTENDED_RELEASE_TABLET | Freq: Two times a day (BID) | ORAL | 0 refills | Status: AC

## 2019-05-22 MED ORDER — HEPARIN SOD (PORK) LOCK FLUSH 100 UNIT/ML IV SOLN
500.0000 [IU] | Freq: Once | INTRAVENOUS | Status: AC | PRN
  Administered 2019-05-22: 500 [IU]
  Filled 2019-05-22: qty 5

## 2019-05-22 MED ORDER — PROCHLORPERAZINE MALEATE 10 MG PO TABS
ORAL_TABLET | ORAL | Status: AC
  Filled 2019-05-22: qty 1

## 2019-05-22 MED ORDER — SODIUM CHLORIDE 0.9% FLUSH
10.0000 mL | INTRAVENOUS | Status: DC | PRN
  Administered 2019-05-22: 13:00:00 10 mL
  Filled 2019-05-22: qty 10

## 2019-05-22 MED ORDER — PROCHLORPERAZINE MALEATE 10 MG PO TABS: 10.0000 mg | ORAL_TABLET | Freq: Four times a day (QID) | ORAL | 0 refills | Status: AC | PRN

## 2019-05-22 NOTE — Progress Notes (Signed)
Per Dr Earlie Server, Rx for 20 MEQ potassium being called in for pt.

## 2019-05-22 NOTE — Telephone Encounter (Signed)
CRITICAL VALUE STICKER  CRITICAL VALUE: K+ = 3.0.  RECEIVER (on-site recipient of call): Cherylynn Ridges RN, Triage [facility] NOTIFIED: 05/22/2019 at 1350.   MESSENGER (representative from lab): Reva MT Myles Gip  MD NOTIFIED: Collaborative nurse for Dr. Julien Nordmann.  TIME OF NOTIFICATION: 05/22/2019 at 1357.  RESPONSE: None, reports has also received notification from treatment nurse.

## 2019-05-22 NOTE — Patient Instructions (Signed)
[facility] Discharge Instructions for Patients Receiving Chemotherapy  Today you received the following chemotherapy agents Gemzar  To help prevent nausea and vomiting after your treatment, we encourage you to take your nausea medication as directed.  If you develop nausea and vomiting that is not controlled by your nausea medication, call the clinic.   BELOW ARE SYMPTOMS THAT SHOULD BE REPORTED IMMEDIATELY:  *FEVER GREATER THAN 100.5 F  *CHILLS WITH OR WITHOUT FEVER  NAUSEA AND VOMITING THAT IS NOT CONTROLLED WITH YOUR NAUSEA MEDICATION  *UNUSUAL SHORTNESS OF BREATH  *UNUSUAL BRUISING OR BLEEDING  TENDERNESS IN MOUTH AND THROAT WITH OR WITHOUT PRESENCE OF ULCERS  *URINARY PROBLEMS  *BOWEL PROBLEMS  UNUSUAL RASH Items with * indicate a potential emergency and should be followed up as soon as possible.  Feel free to call the clinic should you have any questions or concerns. The clinic phone number is [phone].  Please show the [place] at check-in to the Emergency Department and triage nurse.  Gemcitabine injection(Gemzar) What is this medicine? GEMCITABINE (jem SYE ta been) is a chemotherapy drug. This medicine is used to treat many types of cancer like breast cancer, lung cancer, pancreatic cancer, and ovarian cancer. This medicine may be used for other purposes; ask your health care provider or pharmacist if you have questions. COMMON BRAND NAME(S): Gemzar, Infugem What should I tell my health care provider before I take this medicine? They need to know if you have any of these conditions:  blood disorders  infection  kidney disease  liver disease  lung or breathing disease, like asthma  recent or ongoing radiation therapy  an unusual or allergic reaction to gemcitabine, other chemotherapy, other medicines, foods, dyes, or preservatives  pregnant or trying to get pregnant  breast-feeding How should I use this medicine? This  drug is given as an infusion into a vein. It is administered in a hospital or clinic by a specially trained health care professional. Talk to your pediatrician regarding the use of this medicine in children. Special care may be needed. Overdosage: If you think you have taken too much of this medicine contact a poison control center or emergency room at once. NOTE: This medicine is only for you. Do not share this medicine with others. What if I miss a dose? It is important not to miss your dose. Call your doctor or health care professional if you are unable to keep an appointment. What may interact with this medicine?  medicines to increase blood counts like filgrastim, pegfilgrastim, sargramostim  some other chemotherapy drugs like cisplatin  vaccines Talk to your doctor or health care professional before taking any of these medicines:  acetaminophen  aspirin  ibuprofen  ketoprofen  naproxen This list may not describe all possible interactions. Give your health care provider a list of all the medicines, herbs, non-prescription drugs, or dietary supplements you use. Also tell them if you smoke, drink alcohol, or use illegal drugs. Some items may interact with your medicine. What should I watch for while using this medicine? Visit your doctor for checks on your progress. This drug may make you feel generally unwell. This is not uncommon, as chemotherapy can affect healthy cells as well as cancer cells. Report any side effects. Continue your course of treatment even though you feel ill unless your doctor tells you to stop. In some cases, you may be given additional medicines to help with side effects. Follow all directions for their use. Call your  doctor or health care professional for advice if you get a fever, chills or sore throat, or other symptoms of a cold or flu. Do not treat yourself. This drug decreases your body's ability to fight infections. Try to avoid being around people who  are sick. This medicine may increase your risk to bruise or bleed. Call your doctor or health care professional if you notice any unusual bleeding. Be careful brushing and flossing your teeth or using a toothpick because you may get an infection or bleed more easily. If you have any dental work done, tell your dentist you are receiving this medicine. Avoid taking products that contain aspirin, acetaminophen, ibuprofen, naproxen, or ketoprofen unless instructed by your doctor. These medicines may hide a fever. Do not become pregnant while taking this medicine or for 6 months after stopping it. Women should inform their doctor if they wish to become pregnant or think they might be pregnant. Men should not father a child while taking this medicine and for 3 months after stopping it. There is a potential for serious side effects to an unborn child. Talk to your health care professional or pharmacist for more information. Do not breast-feed an infant while taking this medicine or for at least 1 week after stopping it. Men should inform their doctors if they wish to father a child. This medicine may lower sperm counts. Talk with your doctor or health care professional if you are concerned about your fertility. What side effects may I notice from receiving this medicine? Side effects that you should report to your doctor or health care professional as soon as possible:  allergic reactions like skin rash, itching or hives, swelling of the face, lips, or tongue  breathing problems  pain, redness, or irritation at site where injected  signs and symptoms of a dangerous change in heartbeat or heart rhythm like chest pain; dizziness; fast or irregular heartbeat; palpitations; feeling faint or lightheaded, falls; breathing problems  signs of decreased platelets or bleeding - bruising, pinpoint red spots on the skin, black, tarry stools, blood in the urine  signs of decreased red blood cells - unusually weak or  tired, feeling faint or lightheaded, falls  signs of infection - fever or chills, cough, sore throat, pain or difficulty passing urine  signs and symptoms of kidney injury like trouble passing urine or change in the amount of urine  signs and symptoms of liver injury like dark yellow or brown urine; general ill feeling or flu-like symptoms; light-colored stools; loss of appetite; nausea; right upper belly pain; unusually weak or tired; yellowing of the eyes or skin  swelling of ankles, feet, hands Side effects that usually do not require medical attention (report to your doctor or health care professional if they continue or are bothersome):  constipation  diarrhea  hair loss  loss of appetite  nausea  rash  vomiting This list may not describe all possible side effects. Call your doctor for medical advice about side effects. You may report side effects to FDA at 1-800-FDA-1088. Where should I keep my medicine? This drug is given in a hospital or clinic and will not be stored at home. NOTE: This sheet is a summary. It may not cover all possible information. If you have questions about this medicine, talk to your doctor, pharmacist, or health care provider.  2020 [employer] Standard (2017-11-14 18:06:11)

## 2019-05-22 NOTE — Progress Notes (Signed)
OK to treat with Gemzar with K+ 3.2 and NA+ 132 per Dr. Julien Nordmann

## 2019-05-23 ENCOUNTER — Emergency Department (HOSPITAL_COMMUNITY): Payer: Medicare Other

## 2019-05-23 ENCOUNTER — Emergency Department (HOSPITAL_COMMUNITY)
Admission: EM | Admit: 2019-05-23 | Discharge: 2019-05-23 | Disposition: A | Discharge status: Home / Self Care | Payer: Medicare Other | Attending: Emergency Medicine | Admitting: Emergency Medicine

## 2019-05-23 ENCOUNTER — Encounter (HOSPITAL_COMMUNITY): Payer: Self-pay | Admitting: Emergency Medicine

## 2019-05-23 ENCOUNTER — Telehealth: Payer: Self-pay | Admitting: *Deleted

## 2019-05-23 DIAGNOSIS — R509 Fever, unspecified: Secondary | ICD-10-CM | POA: Diagnosis not present

## 2019-05-23 DIAGNOSIS — E876 Hypokalemia: Secondary | ICD-10-CM | POA: Insufficient documentation

## 2019-05-23 DIAGNOSIS — R531 Weakness: Secondary | ICD-10-CM | POA: Diagnosis not present

## 2019-05-23 DIAGNOSIS — C3490 Malignant neoplasm of unspecified part of unspecified bronchus or lung: Secondary | ICD-10-CM | POA: Insufficient documentation

## 2019-05-23 DIAGNOSIS — I10 Essential (primary) hypertension: Secondary | ICD-10-CM | POA: Insufficient documentation

## 2019-05-23 DIAGNOSIS — I4891 Unspecified atrial fibrillation: Secondary | ICD-10-CM | POA: Diagnosis not present

## 2019-05-23 DIAGNOSIS — E039 Hypothyroidism, unspecified: Secondary | ICD-10-CM | POA: Diagnosis not present

## 2019-05-23 DIAGNOSIS — Z87891 Personal history of nicotine dependence: Secondary | ICD-10-CM | POA: Insufficient documentation

## 2019-05-23 DIAGNOSIS — Z79899 Other long term (current) drug therapy: Secondary | ICD-10-CM | POA: Diagnosis not present

## 2019-05-23 DIAGNOSIS — M6281 Muscle weakness (generalized): Secondary | ICD-10-CM | POA: Diagnosis present

## 2019-05-23 DIAGNOSIS — R0902 Hypoxemia: Secondary | ICD-10-CM | POA: Diagnosis not present

## 2019-05-23 DIAGNOSIS — R000 Tachycardia, unspecified: Secondary | ICD-10-CM | POA: Diagnosis not present

## 2019-05-23 DIAGNOSIS — R110 Nausea: Secondary | ICD-10-CM | POA: Diagnosis not present

## 2019-05-23 LAB — URINALYSIS, ROUTINE W REFLEX MICROSCOPIC
Bacteria, UA: NONE SEEN
Bilirubin Urine: NEGATIVE
Glucose, UA: NEGATIVE mg/dL
Ketones, ur: NEGATIVE mg/dL
Leukocytes,Ua: NEGATIVE
Nitrite: NEGATIVE
Protein, ur: NEGATIVE mg/dL
Specific Gravity, Urine: 1.006 (ref 1.005–1.030)
pH: 7 (ref 5.0–8.0)

## 2019-05-23 LAB — CBC WITH DIFFERENTIAL/PLATELET
Abs Immature Granulocytes: 0.03 10*3/uL (ref 0.00–0.07)
Basophils Absolute: 0 10*3/uL (ref 0.0–0.1)
Basophils Relative: 0 %
Eosinophils Absolute: 0.1 10*3/uL (ref 0.0–0.5)
Eosinophils Relative: 1 %
HCT: 31.1 % — ABNORMAL LOW (ref 36.0–46.0)
Hemoglobin: 10.6 g/dL — ABNORMAL LOW (ref 12.0–15.0)
Immature Granulocytes: 0 %
Lymphocytes Relative: 4 %
Lymphs Abs: 0.4 10*3/uL — ABNORMAL LOW (ref 0.7–4.0)
MCH: 30.8 pg (ref 26.0–34.0)
MCHC: 34.1 g/dL (ref 30.0–36.0)
MCV: 90.4 fL (ref 80.0–100.0)
Monocytes Absolute: 0.6 10*3/uL (ref 0.1–1.0)
Monocytes Relative: 5 %
Neutro Abs: 10.2 10*3/uL — ABNORMAL HIGH (ref 1.7–7.7)
Neutrophils Relative %: 90 %
Platelets: 365 10*3/uL (ref 150–400)
RBC: 3.44 MIL/uL — ABNORMAL LOW (ref 3.87–5.11)
RDW: 13 % (ref 11.5–15.5)
WBC: 11.3 10*3/uL — ABNORMAL HIGH (ref 4.0–10.5)
nRBC: 0 % (ref 0.0–0.2)

## 2019-05-23 LAB — COMPREHENSIVE METABOLIC PANEL
ALT: 12 U/L (ref 0–44)
AST: 16 U/L (ref 15–41)
Albumin: 3.5 g/dL (ref 3.5–5.0)
Alkaline Phosphatase: 112 U/L (ref 38–126)
Anion gap: 13 (ref 5–15)
BUN: 12 mg/dL (ref 8–23)
CO2: 29 mmol/L (ref 22–32)
Calcium: 8.9 mg/dL (ref 8.9–10.3)
Chloride: 90 mmol/L — ABNORMAL LOW (ref 98–111)
Creatinine, Ser: 0.86 mg/dL (ref 0.44–1.00)
GFR calc Af Amer: >60 mL/min (ref >60)
GFR calc non Af Amer: >60 mL/min (ref >60)
Glucose, Bld: 110 mg/dL — ABNORMAL HIGH (ref 70–99)
Potassium: 2.8 mmol/L — ABNORMAL LOW (ref 3.5–5.1)
Sodium: 132 mmol/L — ABNORMAL LOW (ref 135–145)
Total Bilirubin: 0.8 mg/dL (ref 0.3–1.2)
Total Protein: 6.7 g/dL (ref 6.5–8.1)

## 2019-05-23 LAB — LACTIC ACID, PLASMA: Lactic Acid, Venous: 0.7 mmol/L (ref 0.5–1.9)

## 2019-05-23 MED ORDER — SODIUM CHLORIDE 0.9 % IV BOLUS
500.0000 mL | Freq: Once | INTRAVENOUS | Status: AC
  Administered 2019-05-23: 10:00:00 500 mL via INTRAVENOUS

## 2019-05-23 MED ORDER — POTASSIUM CHLORIDE CRYS ER 20 MEQ PO TBCR
40.0000 meq | EXTENDED_RELEASE_TABLET | Freq: Once | ORAL | Status: AC
  Administered 2019-05-23: 40 meq via ORAL
  Filled 2019-05-23: qty 2

## 2019-05-23 MED ORDER — ACETAMINOPHEN 325 MG PO TABS
650.0000 mg | ORAL_TABLET | Freq: Once | ORAL | Status: AC
  Administered 2019-05-23: 650 mg via ORAL
  Filled 2019-05-23: qty 2

## 2019-05-23 MED ORDER — HEPARIN SOD (PORK) LOCK FLUSH 100 UNIT/ML IV SOLN
500.0000 [IU] | Freq: Once | INTRAVENOUS | Status: AC
  Administered 2019-05-23: 12:00:00 500 [IU]
  Filled 2019-05-23: qty 5

## 2019-05-23 MED ORDER — ONDANSETRON HCL 4 MG/2ML IJ SOLN
4.0000 mg | Freq: Once | INTRAMUSCULAR | Status: AC
  Administered 2019-05-23: 11:00:00 4 mg via INTRAVENOUS
  Filled 2019-05-23: qty 2

## 2019-05-23 NOTE — ED Notes (Signed)
Patient given discharge teaching and verbalized understanding. Patient was taken out of ED with wheelchair.

## 2019-05-23 NOTE — ED Notes (Signed)
Patient tolerated PO medication and water w/o any difficulties or complaints.

## 2019-05-23 NOTE — ED Provider Notes (Signed)
[facility] DEPT Provider Note   CSN: [id number] Arrival date & time: 05/23/19  7026     History   Chief Complaint Chief Complaint  Patient presents with  . Weakness  . Fever    HPI Kimberly [name] is a 78 y.o. female.  She has a history of non-small cell lung cancer.  She has been through chemo and radiation and immune therapy but had progression of her disease.  She restarted a new chemo agent yesterday by IV infusion.  She had generalized weakness and shaking chills throughout the night and had a fever to 100.7 today.  She has not taken anything for her fever.  She called her cancer nurse and was referred to the emergency department.  She does have weakness no headache no no sore throat.  Chronic cough unchanged.  No abdominal pain.  Chronic nausea vomiting unchanged.  No diarrhea.  No urinary symptoms.  No rashes or swollen joints.  No Covid exposure.     The history is provided by the patient.  Fever Max temp prior to arrival:  100.7 Temp source:  Oral Severity:  Moderate Onset quality:  Sudden Timing:  Constant Progression:  Unchanged Chronicity:  New Relieved by:  None tried Worsened by:  Nothing Ineffective treatments:  None tried Associated symptoms: chills, cough, nausea and vomiting   Associated symptoms: no chest pain, no confusion, no congestion, no diarrhea, no dysuria, no ear pain, no headaches, no myalgias, no rash, no rhinorrhea, no somnolence and no sore throat   Risk factors: hx of cancer and immunosuppression   Risk factors: no sick contacts     Past Medical History:  Diagnosis Date  . Allergic asthma without acute exacerbation or status asthmaticus   . Aortic insufficiency   . Arthritis   . Atrial fibrillation ([place])   . Back pain 08/14/2016  . Bronchitis   . Cough    dry, endobronchial mass  . Dyslipidemia   . GERD (gastroesophageal reflux disease)   . Heart murmur   . History of blood transfusion   . History of  bronchitis   . History of chemotherapy   . History of nuclear stress test 02/26/2006   exercise myoview; normal pattern of perfusion; low risk scan   . Hypertension   . Hypothyroidism   . Mitral insufficiency   . Pneumonia   . PONV (postoperative nausea and vomiting)   . Radiation 12/01/13-01/08/14   50.4 gray to right central chest  . Renal mass, left 04/12/2017  . Shortness of breath    with exertion  . 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 Syncope    "states she has passed out a few times. dr is trying to find cause.last time when geeting ready to go home after video bronch/bx    Patient Active Problem List   Diagnosis Date Noted  . Respiratory failure ([place]) 01/10/2019  . Protein calorie malnutrition ([place]) 03/21/2018  . Recurrent non-small cell lung cancer ([place]) 02/25/2018  . Goals of care, counseling/discussion 01/17/2018  . Encounter for antineoplastic immunotherapy 01/17/2018  . Cough 01/02/2018  . Renal mass, left 04/12/2017  . Pneumonia 02/06/2017  . Acute bronchitis 02/05/2017  . Antineoplastic chemotherapy induced anemia 11/20/2016  . Insect bites 10/02/2016  . Chemotherapy induced neutropenia ([place]) 09/26/2016  . Community acquired pneumonia of right upper lobe of lung ([place])   . Malignant neoplasm of lung ([place])   . Nausea with vomiting   . Hypomagnesemia   .  C. difficile colitis 08/19/2016  . Hypokalemia 08/19/2016  . Dehydration 08/18/2016  . Encounter for antineoplastic chemotherapy 08/14/2016  . Back pain 08/14/2016  . Poor venous access 08/14/2016  . Other fatigue 05/03/2016  . Patient unable to exercise 05/03/2016  . Symptomatic anemia 04/02/2014  . Diarrhea 02/27/2014  . Hyponatremia 02/27/2014  . Anemia 02/27/2014  . Atrial fibrillation with rapid ventricular response ([place]) 02/07/2014    Class: Acute  . Pleuritic chest pain 02/07/2014  . Paroxysmal atrial fibrillation ([place]) 02/07/2014  . Colitis 01/11/2014  . Bloody diarrhea 01/11/2014  . Lung  cancer, middle lobe ([place]) 11/13/2013  . Hemoptysis 09/19/2013  . Collapse of right lung: RML 09/19/2013  . Bronchial obstruction 09/19/2013  . Leukocytosis 09/19/2013  . Obstructive pneumonia 09/19/2013  . Amaurosis fugax 05/23/2013  . Syncope 04/19/2013  . Hyperlipidemia 08/17/2008  . Essential hypertension 08/17/2008  . ALLERGIC RHINITIS 08/17/2008  . Asthma 08/17/2008  . SHORTNESS OF BREATH (SOB) 08/17/2008    Past Surgical History:  Procedure Laterality Date  . CARDIAC CATHETERIZATION  07/08/2008   normal coronaries  . CARPAL TUNNEL RELEASE Right 1988  . COLONOSCOPY N/A 02/11/2016   Procedure: COLONOSCOPY;  Surgeon: Carol Ada, MD;  Location: [facility];  Service: Endoscopy;  Laterality: N/A;  . DILATION AND CURETTAGE OF UTERUS    . ESOPHAGOGASTRODUODENOSCOPY N/A 02/08/2017   Procedure: ESOPHAGOGASTRODUODENOSCOPY (EGD);  Surgeon: Carol Ada, MD;  Location: [facility];  Service: Endoscopy;  Laterality: N/A;  . ESOPHAGOGASTRODUODENOSCOPY (EGD) WITH PROPOFOL N/A 03/21/2019   Procedure: ESOPHAGOGASTRODUODENOSCOPY (EGD) WITH PROPOFOL;  Surgeon: Carol Ada, MD;  Location: [facility];  Service: Endoscopy;  Laterality: N/A;  . EYE SURGERY Bilateral   . H/O MET Test w/PFT  04/02/2012   low risk; peak VO2 77% predicted  . IR GENERIC HISTORICAL  09/12/2016   IR FLUORO GUIDE PORT INSERTION RIGHT 09/12/2016 [name], MD [facility]  . IR GENERIC HISTORICAL  09/12/2016   IR US GUIDE VASC ACCESS RIGHT 09/12/2016 [name], MD [facility]  . JOINT REPLACEMENT  2003   thumb rt  . KNEE ARTHROSCOPY  12   rt meniscus  . MEDIASTINOSCOPY N/A 10/30/2013   Procedure: MEDIASTINOSCOPY;  Surgeon: Melrose Nakayama, MD;  Location: [facility];  Service: Thoracic;  Laterality: N/A;  . ROTATOR CUFF REPAIR  2006   ? side  . THYROIDECTOMY  1973  . TRANSTHORACIC ECHOCARDIOGRAM  09/25/2012   EF 55-60%; mild LVH & mild concentric hypertrophy; mild AV regurg; RV systolic pressure increase  consistent with mild pulm HTN  . VIDEO ASSISTED THORACOSCOPY (VATS)/ LOBECTOMY Right 11/13/2013   Procedure: VIDEO ASSISTED THORACOSCOPY (VATS) with mediastinal  biopsies;  Surgeon: Melrose Nakayama, MD;  Location: [facility];  Service: Thoracic;  Laterality: Right;  RIGHT VATS,mediastinal biopsies  . VIDEO BRONCHOSCOPY Bilateral 09/25/2013   Procedure: VIDEO BRONCHOSCOPY WITHOUT FLUORO;  Surgeon: Tanda Rockers, MD;  Location: [facility];  Service: Cardiopulmonary;  Laterality: Bilateral;  . VIDEO BRONCHOSCOPY WITH ENDOBRONCHIAL ULTRASOUND N/A 10/30/2013   Procedure: VIDEO BRONCHOSCOPY WITH ENDOBRONCHIAL ULTRASOUND;  Surgeon: Melrose Nakayama, MD;  Location: [facility];  Service: Thoracic;  Laterality: N/A;     OB History   No obstetric history on file.      Home Medications    Prior to Admission medications   Medication Sig Start Date End Date Taking? Authorizing Provider  albuterol (VENTOLIN HFA) 108 (90 Base) MCG/ACT inhaler Inhale 1 puff into the lungs every 6 (six) hours as needed (before walking or heavy activity [  try to limit to twice daily]). 01/09/19   Parrett, Fonnie Mu, NP  atorvastatin (LIPITOR) 20 MG tablet Take 20 mg by mouth daily.     [provider]  fluticasone (FLONASE) 50 MCG/ACT nasal spray Place 2 sprays into both nostrils daily as needed (allergies.).     [provider]  levothyroxine (SYNTHROID, LEVOTHROID) 100 MCG tablet Take 100 mcg by mouth every evening.  06/07/17   [provider]  lidocaine-prilocaine (EMLA) cream Apply 1 application topically as needed. Patient taking differently: Apply 1 application topically as needed (for port access).  08/18/16   Susanne Borders, NP  metoprolol succinate (TOPROL-XL) 25 MG 24 hr tablet Take 1 tablet (25 mg total) by mouth daily. 02/24/19   Hilty, Nadean Corwin, MD  Mometasone Furoate [facility] HFA) 100 MCG/ACT AERO Inhale 2 puffs into the lungs 2 (two) times daily. 08/21/18   Martyn Ehrich, NP   montelukast (SINGULAIR) 10 MG tablet Take 10 mg by mouth at bedtime.  04/15/13   [provider]  Multiple Vitamins-Iron (MULTIVITAMIN/IRON PO) Take 1 tablet by mouth daily with breakfast.     [provider]  pantoprazole (PROTONIX) 40 MG tablet TAKE 1 TABLET BY MOUTH  TWICE DAILY 05/05/19   Juanito Doom, MD  potassium chloride (K-DUR) 10 MEQ tablet Take 2 tablets (20 mEq total) by mouth 2 (two) times daily for 10 days. 05/22/19 06/01/19  Curt Bears, MD  prochlorperazine (COMPAZINE) 10 MG tablet Take 1 tablet (10 mg total) by mouth every 6 (six) hours as needed for nausea or vomiting. 05/22/19   Curt Bears, MD  sucralfate (CARAFATE) 1 g tablet Take 1 g by mouth 4 (four) times daily -  with meals and at bedtime.    [provider]  traMADol (ULTRAM) 50 MG tablet Take 25-50 mg by mouth every 8 (eight) hours as needed (severe pain.).  06/08/17   [provider]    Family History Family History  Problem Relation Age of Onset  . Lung cancer Mother   . Heart attack Father   . Heart failure Maternal Grandfather   . Heart attack Paternal Grandfather     Social History Social History   Tobacco Use  . Smoking status: Former Smoker    Packs/day: 0.50    Years: 6.00    Pack years: 3.00    Types: Cigarettes    Quit date: 09/05/1967    Years since quitting: 51.7  . Smokeless tobacco: Never Used  . Tobacco comment: occ wine  Substance Use Topics  . Alcohol use: Yes    Comment: occasional 1-2 drinks/month  . Drug use: No     Allergies   Lactose intolerance (gi), Amiodarone, Augmentin [amoxicillin-pot clavulanate], Milk-related compounds, and Oxycontin  [oxycodone hcl]   Review of Systems Review of Systems  Constitutional: Positive for chills, fatigue and fever.  HENT: Negative for congestion, ear pain, rhinorrhea and sore throat.   Eyes: Negative for visual disturbance.  Respiratory: Positive for cough.   Cardiovascular: Negative for  chest pain.  Gastrointestinal: Positive for nausea and vomiting. Negative for diarrhea.  Genitourinary: Negative for dysuria.  Musculoskeletal: Negative for myalgias.  Skin: Negative for rash.  Neurological: Negative for headaches.  Psychiatric/Behavioral: Negative for confusion.     Physical Exam Updated Vital Signs BP 137/82 (BP Location: Left Arm)   Pulse (!) 110   Temp 100.1 F (37.8 C) (Oral)   Resp 13   Ht '4\' 11"'  (1.499 m)   Wt 61.2  kg   SpO2 96%   BMI 27.27 kg/m   Physical Exam Vitals signs and nursing note reviewed.  Constitutional:      General: She is not in acute distress.    Appearance: She is well-developed.  HENT:     Head: Normocephalic and atraumatic.  Eyes:     Conjunctiva/sclera: Conjunctivae normal.  Neck:     Musculoskeletal: Neck supple.  Cardiovascular:     Rate and Rhythm: Regular rhythm. Tachycardia present.     Heart sounds: Murmur present.  Pulmonary:     Effort: Pulmonary effort is normal. No respiratory distress.     Breath sounds: Normal breath sounds.  Abdominal:     Palpations: Abdomen is soft.     Tenderness: There is no abdominal tenderness. There is no guarding or rebound.  Musculoskeletal: Normal range of motion.     Right lower leg: No edema.     Left lower leg: No edema.  Skin:    General: Skin is warm and dry.  Neurological:     General: No focal deficit present.     Mental Status: She is alert and oriented to person, place, and time. Mental status is at baseline.      ED Treatments / Results  Labs (all labs ordered are listed, but only abnormal results are displayed) Labs Reviewed  COMPREHENSIVE METABOLIC PANEL - Abnormal; Notable for the following components:      Result Value   Sodium 132 (*)    Potassium 2.8 (*)    Chloride 90 (*)    Glucose, Bld 110 (*)    All other components within normal limits  CBC WITH DIFFERENTIAL/PLATELET - Abnormal; Notable for the following components:   WBC 11.3 (*)    RBC 3.44  (*)    Hemoglobin 10.6 (*)    HCT 31.1 (*)    Neutro Abs 10.2 (*)    Lymphs Abs 0.4 (*)    All other components within normal limits  URINALYSIS, ROUTINE W REFLEX MICROSCOPIC - Abnormal; Notable for the following components:   Color, Urine STRAW (*)    Hgb urine dipstick SMALL (*)    All other components within normal limits  CULTURE, BLOOD (ROUTINE X 2)  CULTURE, BLOOD (ROUTINE X 2)  URINE CULTURE  LACTIC ACID, PLASMA    EKG None  Radiology Dg Chest Port 1 View  Result Date: 05/23/2019 CLINICAL DATA:  Fever.  On chemotherapy EXAM: PORTABLE CHEST 1 VIEW COMPARISON:  Chest CT from 9 days ago FINDINGS: Right-sided volume loss and hazy opacity from pulmonary fibrosis and pulmonary mass by comparison CT. The left lung is essentially clear. Normal heart size. Porta catheter on the right with tip at the SVC. IMPRESSION: Known right-sided mass and post treatment fibrosis. No acute finding when compared to prior. Electronically Signed   By: Monte Fantasia M.D.   On: 05/23/2019 10:24    Procedures Procedures (including critical care time)  Medications Ordered in ED Medications  sodium chloride 0.9 % bolus 500 mL (0 mLs Intravenous Stopped 05/23/19 1050)  acetaminophen (TYLENOL) tablet 650 mg (650 mg Oral Given 05/23/19 0941)  potassium chloride SA (K-DUR) CR tablet 40 mEq (40 mEq Oral Given 05/23/19 1048)  ondansetron (ZOFRAN) injection 4 mg (4 mg Intravenous Given 05/23/19 1046)  heparin lock flush 100 unit/mL (500 Units Intracatheter Given 05/23/19 1203)     Initial Impression / Assessment and Plan / ED Course  I have reviewed the triage vital signs and the nursing notes.  Pertinent labs & imaging results that were available during my care of the patient were reviewed by me and considered in my medical decision making (see chart for details).  Clinical Course as of May 22 1914  Fri May 23, 4467  [id number] 78 year old female immunosuppressed on chemotherapy for lung cancer here with a  fever of 100.7.  Repeat temp here 100.1.  Tachycardic with a murmur.  Otherwise no obvious signs of illness.  Differential includes Sirs, sepsis, bacteremia, pneumonia, UTI, Covid   [MB]  83 Discussed with Dr. Earlie Server from oncology.  He said she also got a flu shot yesterday.  He is not worried about her low-grade temperature here and recommends that she go home and he will follow her up as an outpatient.  Will review with patient.   [MB]    Clinical Course User Index [MB] Hayden Rasmussen, MD   [name] was evaluated in Emergency Department on 05/23/2019 for the symptoms described in the history of present illness. She was evaluated in the context of the global COVID-19 pandemic, which necessitated consideration that the patient might be at risk for infection with the SARS-CoV-2 virus that causes COVID-19. Institutional protocols and algorithms that pertain to the evaluation of patients at risk for COVID-19 are in a state of rapid change based on information released by regulatory bodies including the CDC and federal and state organizations. These policies and algorithms were followed during the patient's care in the ED.       Final Clinical Impressions(s) / ED Diagnoses   Final diagnoses:  Fever, unspecified fever cause  Malignant neoplasm of lung, unspecified laterality, unspecified part of lung ([place])  Hypokalemia    ED Discharge Orders    None       Hayden Rasmussen, MD 05/23/19 1916

## 2019-05-23 NOTE — ED Triage Notes (Signed)
Patient arrived from home. Pt c/o generalized weakness and fever x 1 day.   Pt has lung cancer per EMS.   Pt denied being around anyone w/ COVID.   Patient had chemotherapy yesterday. Patient had new chemotherapy treatment yesterday.   Hx of A-fib,HTN.

## 2019-05-23 NOTE — Discharge Instructions (Addendum)
You were seen in the emergency department for evaluation of a fever in the setting of undergoing chemotherapy for your lung cancer.  You had a chest x-ray urinalysis blood work that did not show any serious findings other than a low potassium.  We gave you some potassium supplements here.  Dr. Earlie Server is aware of your fever and feels comfortable sending you home.  Please keep them aware of how you are doing.  Return to the emergency department if any concerns.

## 2019-05-23 NOTE — Telephone Encounter (Signed)
Received vm message from Angie @ [facility] agency. She called to advise that d/t staffing issues, they would not be able to provide home health services for Kimberly [name]. Angie had tried 2 other agencies to help the situation-[facility] and [facility]. They, too, were unable to provide services

## 2019-05-24 LAB — URINE CULTURE: Culture: 30000 — AB

## 2019-05-28 LAB — CULTURE, BLOOD (ROUTINE X 2)
Culture: NO GROWTH
Culture: NO GROWTH
Special Requests: ADEQUATE

## 2019-05-29 ENCOUNTER — Inpatient Hospital Stay: Payer: Medicare Other

## 2019-05-29 ENCOUNTER — Other Ambulatory Visit: Payer: Self-pay

## 2019-05-29 VITALS — BP 139/70 | HR 80 | Temp 98.3°F | Resp 16

## 2019-05-29 DIAGNOSIS — Z923 Personal history of irradiation: Secondary | ICD-10-CM | POA: Diagnosis not present

## 2019-05-29 DIAGNOSIS — C3490 Malignant neoplasm of unspecified part of unspecified bronchus or lung: Secondary | ICD-10-CM

## 2019-05-29 DIAGNOSIS — C342 Malignant neoplasm of middle lobe, bronchus or lung: Secondary | ICD-10-CM | POA: Diagnosis not present

## 2019-05-29 DIAGNOSIS — Z7951 Long term (current) use of inhaled steroids: Secondary | ICD-10-CM | POA: Diagnosis not present

## 2019-05-29 DIAGNOSIS — Z9221 Personal history of antineoplastic chemotherapy: Secondary | ICD-10-CM | POA: Diagnosis not present

## 2019-05-29 DIAGNOSIS — E785 Hyperlipidemia, unspecified: Secondary | ICD-10-CM | POA: Diagnosis not present

## 2019-05-29 DIAGNOSIS — E860 Dehydration: Secondary | ICD-10-CM

## 2019-05-29 DIAGNOSIS — Z23 Encounter for immunization: Secondary | ICD-10-CM | POA: Diagnosis not present

## 2019-05-29 DIAGNOSIS — I4891 Unspecified atrial fibrillation: Secondary | ICD-10-CM | POA: Diagnosis not present

## 2019-05-29 DIAGNOSIS — E039 Hypothyroidism, unspecified: Secondary | ICD-10-CM | POA: Diagnosis not present

## 2019-05-29 DIAGNOSIS — Z5111 Encounter for antineoplastic chemotherapy: Secondary | ICD-10-CM | POA: Diagnosis not present

## 2019-05-29 DIAGNOSIS — I10 Essential (primary) hypertension: Secondary | ICD-10-CM | POA: Diagnosis not present

## 2019-05-29 DIAGNOSIS — J45909 Unspecified asthma, uncomplicated: Secondary | ICD-10-CM | POA: Diagnosis not present

## 2019-05-29 DIAGNOSIS — Z79899 Other long term (current) drug therapy: Secondary | ICD-10-CM | POA: Diagnosis not present

## 2019-05-29 DIAGNOSIS — G629 Polyneuropathy, unspecified: Secondary | ICD-10-CM | POA: Diagnosis not present

## 2019-05-29 LAB — CMP (CANCER CENTER ONLY)
ALT: 19 U/L (ref 0–44)
AST: 21 U/L (ref 15–41)
Albumin: 3.1 g/dL — ABNORMAL LOW (ref 3.5–5.0)
Alkaline Phosphatase: 134 U/L — ABNORMAL HIGH (ref 38–126)
Anion gap: 9 (ref 5–15)
BUN: 16 mg/dL (ref 8–23)
CO2: 28 mmol/L (ref 22–32)
Calcium: 9 mg/dL (ref 8.9–10.3)
Chloride: 98 mmol/L (ref 98–111)
Creatinine: 1.03 mg/dL — ABNORMAL HIGH (ref 0.44–1.00)
GFR, Est AFR Am: >60 mL/min (ref >60)
GFR, Estimated: 53 mL/min — ABNORMAL LOW (ref >60)
Glucose, Bld: 121 mg/dL — ABNORMAL HIGH (ref 70–99)
Potassium: 3.6 mmol/L (ref 3.5–5.1)
Sodium: 135 mmol/L (ref 135–145)
Total Bilirubin: 0.4 mg/dL (ref 0.3–1.2)
Total Protein: 6.7 g/dL (ref 6.5–8.1)

## 2019-05-29 LAB — CBC WITH DIFFERENTIAL (CANCER CENTER ONLY)
Abs Immature Granulocytes: 0.05 10*3/uL (ref 0.00–0.07)
Basophils Absolute: 0.1 10*3/uL (ref 0.0–0.1)
Basophils Relative: 1 %
Eosinophils Absolute: 0.1 10*3/uL (ref 0.0–0.5)
Eosinophils Relative: 2 %
HCT: 29 % — ABNORMAL LOW (ref 36.0–46.0)
Hemoglobin: 9.9 g/dL — ABNORMAL LOW (ref 12.0–15.0)
Immature Granulocytes: 1 %
Lymphocytes Relative: 16 %
Lymphs Abs: 1.2 10*3/uL (ref 0.7–4.0)
MCH: 30.7 pg (ref 26.0–34.0)
MCHC: 34.1 g/dL (ref 30.0–36.0)
MCV: 89.8 fL (ref 80.0–100.0)
Monocytes Absolute: 0.5 10*3/uL (ref 0.1–1.0)
Monocytes Relative: 8 %
Neutro Abs: 5.3 10*3/uL (ref 1.7–7.7)
Neutrophils Relative %: 72 %
Platelet Count: 290 10*3/uL (ref 150–400)
RBC: 3.23 MIL/uL — ABNORMAL LOW (ref 3.87–5.11)
RDW: 12.8 % (ref 11.5–15.5)
WBC Count: 7.2 10*3/uL (ref 4.0–10.5)
nRBC: 0 % (ref 0.0–0.2)

## 2019-05-29 MED ORDER — SODIUM CHLORIDE 0.9 % IV SOLN
Freq: Once | INTRAVENOUS | Status: AC
  Administered 2019-05-29: 16:00:00 via INTRAVENOUS
  Filled 2019-05-29: qty 250

## 2019-05-29 MED ORDER — PROCHLORPERAZINE MALEATE 10 MG PO TABS
ORAL_TABLET | ORAL | Status: AC
  Filled 2019-05-29: qty 1

## 2019-05-29 MED ORDER — SODIUM CHLORIDE 0.9% FLUSH
10.0000 mL | INTRAVENOUS | Status: DC | PRN
  Administered 2019-05-29: 15:00:00 10 mL
  Filled 2019-05-29: qty 10

## 2019-05-29 MED ORDER — SODIUM CHLORIDE 0.9 % IV SOLN
1000.0000 mg/m2 | Freq: Once | INTRAVENOUS | Status: AC
  Administered 2019-05-29: 1634 mg via INTRAVENOUS
  Filled 2019-05-29: qty 42.98

## 2019-05-29 MED ORDER — SODIUM CHLORIDE 0.9% FLUSH
10.0000 mL | INTRAVENOUS | Status: DC | PRN
  Administered 2019-05-29: 10 mL
  Filled 2019-05-29: qty 10

## 2019-05-29 MED ORDER — HEPARIN SOD (PORK) LOCK FLUSH 100 UNIT/ML IV SOLN
500.0000 [IU] | Freq: Once | INTRAVENOUS | Status: AC | PRN
  Administered 2019-05-29: 500 [IU]
  Filled 2019-05-29: qty 5

## 2019-05-29 MED ORDER — PROCHLORPERAZINE MALEATE 10 MG PO TABS
10.0000 mg | ORAL_TABLET | Freq: Once | ORAL | Status: AC
  Administered 2019-05-29: 10 mg via ORAL

## 2019-05-29 NOTE — Progress Notes (Signed)
Dr. Earlie Server aware that after last treatment pt was seen in the ER for fever and n/v. He would like to proceed with treatment plan today.

## 2019-05-31 ENCOUNTER — Other Ambulatory Visit: Payer: Self-pay | Admitting: Internal Medicine

## 2019-06-12 ENCOUNTER — Other Ambulatory Visit: Payer: Self-pay

## 2019-06-12 ENCOUNTER — Inpatient Hospital Stay: Payer: Medicare Other

## 2019-06-12 ENCOUNTER — Ambulatory Visit: Payer: Medicare Other | Admitting: Internal Medicine

## 2019-06-12 ENCOUNTER — Telehealth: Payer: Self-pay | Admitting: Internal Medicine

## 2019-06-12 ENCOUNTER — Inpatient Hospital Stay: Payer: Medicare Other | Attending: Internal Medicine

## 2019-06-12 ENCOUNTER — Other Ambulatory Visit: Payer: Medicare Other

## 2019-06-12 ENCOUNTER — Ambulatory Visit: Payer: Medicare Other

## 2019-06-12 ENCOUNTER — Inpatient Hospital Stay (HOSPITAL_BASED_OUTPATIENT_CLINIC_OR_DEPARTMENT_OTHER): Payer: Medicare Other | Admitting: Physician Assistant

## 2019-06-12 VITALS — BP 123/68 | HR 93 | Temp 98.7°F | Resp 18 | Ht 59.0 in | Wt 135.9 lb

## 2019-06-12 DIAGNOSIS — C342 Malignant neoplasm of middle lobe, bronchus or lung: Secondary | ICD-10-CM

## 2019-06-12 DIAGNOSIS — Z923 Personal history of irradiation: Secondary | ICD-10-CM | POA: Diagnosis not present

## 2019-06-12 DIAGNOSIS — E039 Hypothyroidism, unspecified: Secondary | ICD-10-CM | POA: Diagnosis not present

## 2019-06-12 DIAGNOSIS — Z79899 Other long term (current) drug therapy: Secondary | ICD-10-CM | POA: Insufficient documentation

## 2019-06-12 DIAGNOSIS — J45909 Unspecified asthma, uncomplicated: Secondary | ICD-10-CM | POA: Insufficient documentation

## 2019-06-12 DIAGNOSIS — Z5111 Encounter for antineoplastic chemotherapy: Secondary | ICD-10-CM

## 2019-06-12 DIAGNOSIS — Z7951 Long term (current) use of inhaled steroids: Secondary | ICD-10-CM | POA: Diagnosis not present

## 2019-06-12 DIAGNOSIS — C3490 Malignant neoplasm of unspecified part of unspecified bronchus or lung: Secondary | ICD-10-CM

## 2019-06-12 DIAGNOSIS — I4891 Unspecified atrial fibrillation: Secondary | ICD-10-CM | POA: Diagnosis not present

## 2019-06-12 DIAGNOSIS — E785 Hyperlipidemia, unspecified: Secondary | ICD-10-CM | POA: Insufficient documentation

## 2019-06-12 DIAGNOSIS — I10 Essential (primary) hypertension: Secondary | ICD-10-CM | POA: Insufficient documentation

## 2019-06-12 DIAGNOSIS — E860 Dehydration: Secondary | ICD-10-CM

## 2019-06-12 DIAGNOSIS — Z95828 Presence of other vascular implants and grafts: Secondary | ICD-10-CM

## 2019-06-12 LAB — CBC WITH DIFFERENTIAL (CANCER CENTER ONLY)
Abs Immature Granulocytes: 0.05 10*3/uL (ref 0.00–0.07)
Basophils Absolute: 0.1 10*3/uL (ref 0.0–0.1)
Basophils Relative: 1 %
Eosinophils Absolute: 0.2 10*3/uL (ref 0.0–0.5)
Eosinophils Relative: 2 %
HCT: 29.3 % — ABNORMAL LOW (ref 36.0–46.0)
Hemoglobin: 9.8 g/dL — ABNORMAL LOW (ref 12.0–15.0)
Immature Granulocytes: 1 %
Lymphocytes Relative: 16 %
Lymphs Abs: 1.6 10*3/uL (ref 0.7–4.0)
MCH: 30.6 pg (ref 26.0–34.0)
MCHC: 33.4 g/dL (ref 30.0–36.0)
MCV: 91.6 fL (ref 80.0–100.0)
Monocytes Absolute: 0.9 10*3/uL (ref 0.1–1.0)
Monocytes Relative: 9 %
Neutro Abs: 7.2 10*3/uL (ref 1.7–7.7)
Neutrophils Relative %: 71 %
Platelet Count: 543 10*3/uL — ABNORMAL HIGH (ref 150–400)
RBC: 3.2 MIL/uL — ABNORMAL LOW (ref 3.87–5.11)
RDW: 14.1 % (ref 11.5–15.5)
WBC Count: 10 10*3/uL (ref 4.0–10.5)
nRBC: 0 % (ref 0.0–0.2)

## 2019-06-12 LAB — CMP (CANCER CENTER ONLY)
ALT: 11 U/L (ref 0–44)
AST: 12 U/L — ABNORMAL LOW (ref 15–41)
Albumin: 3.3 g/dL — ABNORMAL LOW (ref 3.5–5.0)
Alkaline Phosphatase: 144 U/L — ABNORMAL HIGH (ref 38–126)
Anion gap: 11 (ref 5–15)
BUN: 18 mg/dL (ref 8–23)
CO2: 30 mmol/L (ref 22–32)
Calcium: 9.4 mg/dL (ref 8.9–10.3)
Chloride: 96 mmol/L — ABNORMAL LOW (ref 98–111)
Creatinine: 1.05 mg/dL — ABNORMAL HIGH (ref 0.44–1.00)
GFR, Est AFR Am: >60 mL/min (ref >60)
GFR, Estimated: 52 mL/min — ABNORMAL LOW (ref >60)
Glucose, Bld: 110 mg/dL — ABNORMAL HIGH (ref 70–99)
Potassium: 3.4 mmol/L — ABNORMAL LOW (ref 3.5–5.1)
Sodium: 137 mmol/L (ref 135–145)
Total Bilirubin: 0.4 mg/dL (ref 0.3–1.2)
Total Protein: 7 g/dL (ref 6.5–8.1)

## 2019-06-12 MED ORDER — SODIUM CHLORIDE 0.9% FLUSH
10.0000 mL | INTRAVENOUS | Status: DC | PRN
  Administered 2019-06-12: 14:00:00 10 mL via INTRAVENOUS
  Filled 2019-06-12: qty 10

## 2019-06-12 MED ORDER — SODIUM CHLORIDE 0.9 % IV SOLN
1000.0000 mg/m2 | Freq: Once | INTRAVENOUS | Status: AC
  Administered 2019-06-12: 16:00:00 1634 mg via INTRAVENOUS
  Filled 2019-06-12: qty 42.98

## 2019-06-12 MED ORDER — PROCHLORPERAZINE MALEATE 10 MG PO TABS
10.0000 mg | ORAL_TABLET | Freq: Once | ORAL | Status: AC
  Administered 2019-06-12: 10 mg via ORAL

## 2019-06-12 MED ORDER — HEPARIN SOD (PORK) LOCK FLUSH 100 UNIT/ML IV SOLN
500.0000 [IU] | Freq: Once | INTRAVENOUS | Status: AC | PRN
  Administered 2019-06-12: 500 [IU]
  Filled 2019-06-12: qty 5

## 2019-06-12 MED ORDER — PROCHLORPERAZINE MALEATE 10 MG PO TABS
ORAL_TABLET | ORAL | Status: AC
  Filled 2019-06-12: qty 1

## 2019-06-12 MED ORDER — SODIUM CHLORIDE 0.9% FLUSH
10.0000 mL | INTRAVENOUS | Status: DC | PRN
  Administered 2019-06-12: 17:00:00 10 mL
  Filled 2019-06-12: qty 10

## 2019-06-12 MED ORDER — SODIUM CHLORIDE 0.9 % IV SOLN
Freq: Once | INTRAVENOUS | Status: AC
  Administered 2019-06-12: 16:00:00 via INTRAVENOUS
  Filled 2019-06-12: qty 250

## 2019-06-12 NOTE — Progress Notes (Signed)
[facility] OFFICE PROGRESS NOTE  Deland Pretty, MD [address] [place] [address]  DIAGNOSIS: Recurrent non-small cell lung cancer, squamous cell carcinoma initially diagnosed as unresectable a stage IIIa in February 2015.  PRIOR THERAPY: 1) Status post exploratory right VATS with mediastinal biopsy under the care of Dr. Roxan Hockey on 11/13/2013. The tumor was found to be unresectable at that time.[name] 2) Concurrent chemoradiation with weekly carboplatin for AUC of 2 and paclitaxel 45 mg/M2, status post 6 cycles with partial response. First cycle was given on 12/01/2013. 3) Consolidation chemotherapy with carboplatin for AUC of 5 and paclitaxel 175 mg/M2 every 3 weeks with Neulasta support. First dose 02/23/2014. Status post 3 cycles with partial response. 4) Systemic chemotherapy with carboplatin for AUC of 5 and paclitaxel 175 MG/M2 every 3 weeks with Neulasta support. Status post 6 cycles. First dose was given on 08/14/2016. Last dose was giving 12/11/2016 with partial response. 5) Second line treatment with immunotherapy with Nivolumab 480 mg IV every 4 weeks, first dose Jan 25, 2018. Status post 17cycles  CURRENT THERAPY: Systemic chemotherapy with gemcitabine 1000 mg/m on days 1 and 8 every 3 weeks.  First dose on 05/22/2019. Status post 1 cycle.   INTERVAL HISTORY: Kimberly [name] 78 y.o. female returns to the clinic for a follow-up visit.  The patient's treatment was recently switched to chemotherapy with gemcitabine due to evidence of disease of disease progression on her most recent PET scan.  She started her first dose of treatment on 05/22/2019.  The patient tolerated her first cycle of chemotherapy well without any concerning adverse side effects; however, she did experience a fever the day following her first cycle of treatment.  She was seen in the emergency room.  No source of infection was found.  The patient denied any chills, night sweats,  nasal congestion, sore throat, skin infections, or dysuria. She denied any cough different from her baseline cough.  She did have some diarrhea at that time which resolved with Imodium.  The patient's fever resolved and she has been feeling well since that time.   The patient had previously been experiencing persistent nausea and vomiting with her prior treatment with immunotherapy.  The patient states that since switching to her current treatment with gemcitabine, that she has not had any more episodes of nausea. Today, she denies any fever, chills, or night sweats. She reports continued fatigue. She continues to lose weight and she has an appointment to meet with a nutritionist next week. She states that she does not have an appetite; however, she does force herself to eat because she realizes the importance of maintaining good nutrition. She denies any headaches or visual changes. She denies any headaches or visual changes. She occassionally gets a sharp pain along her posterior rib cage around the level of her known cancer invasion of her 4th and 5th rib cage. She characterizes her pain as sharp and that it resolves spontaneously without any intervention. She is here for evaluation before starting cycle #2 of treatment.   MEDICAL HISTORY: Past Medical History:  Diagnosis Date  . Allergic asthma without acute exacerbation or status asthmaticus   . Aortic insufficiency   . Arthritis   . Atrial fibrillation ([place])   . Back pain 08/14/2016  . Bronchitis   . Cough    dry, endobronchial mass  . Dyslipidemia   . GERD (gastroesophageal reflux disease)   . Heart murmur   . History of blood transfusion   .  History of bronchitis   . History of chemotherapy   . History of nuclear stress test 02/26/2006   exercise myoview; normal pattern of perfusion; low risk scan   . Hypertension   . Hypothyroidism   . Mitral insufficiency   . Pneumonia   . PONV (postoperative nausea and vomiting)   . Radiation  12/01/13-01/08/14   50.4 gray to right central chest  . Renal mass, left 04/12/2017  . Shortness of breath    with exertion  . Squamous cell carcinoma of lung ([place])   . Syncope    "states she has passed out a few times. dr is trying to find cause.last time when geeting ready to go home after video bronch/bx    ALLERGIES:  is allergic to lactose intolerance (gi); amiodarone; augmentin [amoxicillin-pot clavulanate]; milk-related compounds; and oxycontin  [oxycodone hcl].  MEDICATIONS:  Current Outpatient Medications  Medication Sig Dispense Refill  . albuterol (VENTOLIN HFA) 108 (90 Base) MCG/ACT inhaler Inhale 1 puff into the lungs every 6 (six) hours as needed (before walking or heavy activity [try to limit to twice daily]). 1 Inhaler 5  . amLODipine (NORVASC) 5 MG tablet TAKE 1 TABLET BY MOUTH EVERY DAY 90 tablet 0  . atorvastatin (LIPITOR) 20 MG tablet Take 20 mg by mouth daily.     . chlorthalidone (HYGROTON) 25 MG tablet Take 25 mg by mouth daily.    . fluticasone (FLONASE) 50 MCG/ACT nasal spray Place 2 sprays into both nostrils daily as needed (allergies.).     [name] levothyroxine (SYNTHROID, LEVOTHROID) 100 MCG tablet Take 100 mcg by mouth every evening.     . lidocaine-prilocaine (EMLA) cream Apply 1 application topically as needed. (Patient taking differently: Apply 1 application topically as needed (for port access). ) 30 g 0  . metoprolol succinate (TOPROL-XL) 25 MG 24 hr tablet Take 1 tablet (25 mg total) by mouth daily. 90 tablet 3  . Mometasone Furoate (ASMANEX HFA) 100 MCG/ACT AERO Inhale 2 puffs into the lungs 2 (two) times daily. 39 g 3  . montelukast (SINGULAIR) 10 MG tablet Take 10 mg by mouth at bedtime.     . Multiple Vitamins-Iron (MULTIVITAMIN/IRON PO) Take 1 tablet by mouth daily with breakfast.     . pantoprazole (PROTONIX) 40 MG tablet TAKE 1 TABLET BY MOUTH  TWICE DAILY 120 tablet 3  . prochlorperazine (COMPAZINE) 10 MG tablet Take 1 tablet (10 mg total) by mouth every 6  (six) hours as needed for nausea or vomiting. 30 tablet 0  . traMADol (ULTRAM) 50 MG tablet Take 25-50 mg by mouth every 8 (eight) hours as needed (severe pain.).   3  . ondansetron (ZOFRAN) 4 MG tablet Take 4 mg by mouth 3 (three) times daily as needed for nausea/vomiting.    . potassium chloride (K-DUR) 10 MEQ tablet Take 2 tablets (20 mEq total) by mouth 2 (two) times daily for 10 days. (Patient not taking: Reported on 06/12/2019) 40 tablet 0  . promethazine (PHENERGAN) 25 MG tablet Take 25 mg by mouth 4 (four) times daily as needed for nausea/vomiting.    . sucralfate (CARAFATE) 1 g tablet Take 1 g by mouth 4 (four) times daily -  with meals and at bedtime.     No current facility-administered medications for this visit.    Facility-Administered Medications Ordered in Other Visits  Medication Dose Route Frequency Provider Last Rate Last Dose  . heparin lock flush 100 unit/mL  500 Units Intracatheter Once PRN Curt Bears, MD      .  sodium chloride flush (NS) 0.9 % injection 10 mL  10 mL Intracatheter PRN Curt Bears, MD        SURGICAL HISTORY:  Past Surgical History:  Procedure Laterality Date  . CARDIAC CATHETERIZATION  07/08/2008   normal coronaries  . CARPAL TUNNEL RELEASE Right 1988  . COLONOSCOPY N/A 02/11/2016   Procedure: COLONOSCOPY;  Surgeon: Carol Ada, MD;  Location: [facility];  Service: Endoscopy;  Laterality: N/A;  . DILATION AND CURETTAGE OF UTERUS    . ESOPHAGOGASTRODUODENOSCOPY N/A 02/08/2017   Procedure: ESOPHAGOGASTRODUODENOSCOPY (EGD);  Surgeon: Carol Ada, MD;  Location: [facility];  Service: Endoscopy;  Laterality: N/A;  . ESOPHAGOGASTRODUODENOSCOPY (EGD) WITH PROPOFOL N/A 03/21/2019   Procedure: ESOPHAGOGASTRODUODENOSCOPY (EGD) WITH PROPOFOL;  Surgeon: Carol Ada, MD;  Location: [facility];  Service: Endoscopy;  Laterality: N/A;  . EYE SURGERY Bilateral   . H/O MET Test w/PFT  04/02/2012   low risk; peak VO2 77% predicted  . IR GENERIC  HISTORICAL  09/12/2016   IR FLUORO GUIDE PORT INSERTION RIGHT 09/12/2016 [name], MD [facility]  . IR GENERIC HISTORICAL  09/12/2016   IR US GUIDE VASC ACCESS RIGHT 09/12/2016 [name], MD [facility]  . JOINT REPLACEMENT  2003   thumb rt  . KNEE ARTHROSCOPY  12   rt meniscus  . MEDIASTINOSCOPY N/A 10/30/2013   Procedure: MEDIASTINOSCOPY;  Surgeon: Melrose Nakayama, MD;  Location: [facility];  Service: Thoracic;  Laterality: N/A;  . ROTATOR CUFF REPAIR  2006   ? side  . THYROIDECTOMY  1973  . TRANSTHORACIC ECHOCARDIOGRAM  09/25/2012   EF 55-60%; mild LVH & mild concentric hypertrophy; mild AV regurg; RV systolic pressure increase consistent with mild pulm HTN  . VIDEO ASSISTED THORACOSCOPY (VATS)/ LOBECTOMY Right 11/13/2013   Procedure: VIDEO ASSISTED THORACOSCOPY (VATS) with mediastinal  biopsies;  Surgeon: Melrose Nakayama, MD;  Location: [facility];  Service: Thoracic;  Laterality: Right;  RIGHT VATS,mediastinal biopsies  . VIDEO BRONCHOSCOPY Bilateral 09/25/2013   Procedure: VIDEO BRONCHOSCOPY WITHOUT FLUORO;  Surgeon: Tanda Rockers, MD;  Location: [facility];  Service: Cardiopulmonary;  Laterality: Bilateral;  . VIDEO BRONCHOSCOPY WITH ENDOBRONCHIAL ULTRASOUND N/A 10/30/2013   Procedure: VIDEO BRONCHOSCOPY WITH ENDOBRONCHIAL ULTRASOUND;  Surgeon: Melrose Nakayama, MD;  Location: [facility];  Service: Thoracic;  Laterality: N/A;    REVIEW OF SYSTEMS:   Review of Systems  Constitutional: Positive for fatigue and a few pounds of weight loss with decreased appetite. Negative for chills and recent fever HENT: Negative for mouth sores, nosebleeds, sore throat and trouble swallowing.   Eyes: Negative for eye problems and icterus.  Respiratory: Positive chronic cough. Negative for hemoptysis, shortness of breath and wheezing.   Cardiovascular: Negative for chest pain and leg swelling.  Gastrointestinal: Negative for abdominal pain, constipation, diarrhea, nausea and vomiting.   Genitourinary: Negative for bladder incontinence, difficulty urinating, dysuria, frequency and hematuria.   Musculoskeletal: Positive for right posterior rib pain. Negative for gait problem, neck pain and neck stiffness.  Skin: Negative for itching and rash.  Neurological: Negative for dizziness, extremity weakness, gait problem, headaches, light-headedness and seizures.  Hematological: Negative for adenopathy. Does not bruise/bleed easily.  Psychiatric/Behavioral: Negative for confusion, depression and sleep disturbance. The patient is not nervous/anxious.     PHYSICAL EXAMINATION:  Blood pressure 123/68, pulse 93, temperature 98.7 F (37.1 C), resp. rate 18, height '4\' 11"'  (1.499 m), weight 135 lb 14.4 oz (61.6 kg), SpO2 100 %.  ECOG PERFORMANCE STATUS: 1 - Symptomatic but completely ambulatory  Physical Exam  Constitutional: Oriented to person, place, and time and well-developed, well-nourished, and in no distress.   HENT:  Head: Normocephalic and atraumatic.  Mouth/Throat: Oropharynx is clear and moist. No oropharyngeal exudate.  Eyes: Conjunctivae are normal. Right eye exhibits no discharge. Left eye exhibits no discharge. No scleral icterus.  Neck: Normal range of motion. Neck supple.  Cardiovascular: Normal rate, regular rhythm, normal heart sounds and intact distal pulses.   Pulmonary/Chest: Effort normal and breath sounds normal. No respiratory distress. No wheezes. No rales.  Abdominal: Soft. Bowel sounds are normal. Exhibits no distension and no mass. There is no tenderness.  Musculoskeletal: Normal range of motion. Exhibits no edema.  Lymphadenopathy:    No cervical adenopathy.  Neurological: Alert and oriented to person, place, and time. Exhibits normal muscle tone. Gait normal. Coordination normal.  Skin: Skin is warm and dry. No rash noted. Not diaphoretic. No erythema. No pallor.  Psychiatric: Mood, memory and judgment normal.  Vitals reviewed.  LABORATORY DATA: Lab  Results  Component Value Date   WBC 10.0 06/12/2019   HGB 9.8 (L) 06/12/2019   HCT 29.3 (L) 06/12/2019   MCV 91.6 06/12/2019   PLT 543 (H) 06/12/2019      Chemistry      Component Value Date/Time   NA 137 06/12/2019 1428   NA 140 07/12/2017 1212   K 3.4 (L) 06/12/2019 1428   K 3.5 07/12/2017 1212   CL 96 (L) 06/12/2019 1428   CO2 30 06/12/2019 1428   CO2 27 07/12/2017 1212   BUN 18 06/12/2019 1428   BUN 27.5 (H) 07/12/2017 1212   CREATININE 1.05 (H) 06/12/2019 1428   CREATININE 1.0 07/12/2017 1212      Component Value Date/Time   CALCIUM 9.4 06/12/2019 1428   CALCIUM 10.0 07/12/2017 1212   ALKPHOS 144 (H) 06/12/2019 1428   ALKPHOS 119 07/12/2017 1212   AST 12 (L) 06/12/2019 1428   AST 16 07/12/2017 1212   ALT 11 06/12/2019 1428   ALT 15 07/12/2017 1212   BILITOT 0.4 06/12/2019 1428   BILITOT 0.48 07/12/2017 1212       RADIOGRAPHIC STUDIES:  Ct Chest W Contrast  Result Date: 05/14/2019 CLINICAL DATA:  Non-small cell lung cancer restaging. Ongoing immunotherapy. Chemotherapy and radiation therapy completed. EXAM: CT CHEST, ABDOMEN, AND PELVIS WITH CONTRAST TECHNIQUE: Multidetector CT imaging of the chest, abdomen and pelvis was performed following the standard protocol during bolus administration of intravenous contrast. CONTRAST:  137m OMNIPAQUE IOHEXOL 300 MG/ML  SOLN COMPARISON:  Multiple exams, including 02/18/2019. FINDINGS: CT CHEST FINDINGS Cardiovascular: Right Port-A-Cath tip: SVC. Coronary, aortic arch, and branch vessel atherosclerotic vascular disease. Mediastinum/Nodes: Thyroidectomy. Rightward mediastinal shift. Right upper paratracheal node 0.7 cm in short axis on image 9/2. Subcarinal node 1.0 cm in short axis, 22/2. Small lower paraesophageal lymph nodes are present. Lungs/Pleura: Centrally necrotic right middle lobe lung mass measures 6.7 by 5.3 by 3.4 cm (volume = 63 cm^3) on image 26/2, formerly by my measurements 5.9 by 4.9 by 2.9 cm (volume = 44 cm^3).  This lesion invades the fourth intercostal space and the fourth and fifth ribs, a significant worsening from the prior exam. Adjacent pleural thickening noted. Consolidation posteriorly in the right upper lobe, similar to prior, with associated air bronchograms. There is also some consolidation along the margins of the right middle lobe mass, primarily in the anterior segment right lower lobe and in the right middle lobe, much of which may be from prior radiation therapy. A  left lower lobe pulmonary nodule measuring 0.7 cm in diameter on image 80/6 is similar to the prior exam and has a large central calcification, probably a granuloma or pulmonary hamartoma. A separate left lower lobe 3 mm granuloma is present on image 90/6. A subpleural nodule measuring 0.4 by 0.3 cm is stable on image 82/6. No new left-sided pulmonary nodules are identified. Musculoskeletal: As noted above, there is bony erosion of the right fourth and fifth ribs which is new/increased due to adjacent tumor also invading the fourth intercostal space. Lipoma along the left infraspinatus/teres minor muscles. CT ABDOMEN PELVIS FINDINGS Hepatobiliary: Unremarkable Pancreas: Unremarkable Spleen: Unremarkable Adrenals/Urinary Tract: Both adrenal glands appear normal. Complex 1.6 by 1.2 by 1.3 cm lesion of the right kidney upper pole has a band of accentuated density internally and this band could be significantly enhancing, or may simply represent blood products, for example on image 49/5. Simple appearing right kidney lower pole cyst. Stomach/Bowel: Thickening in the stomach antrum could be from antritis or simply from contraction. Normal appendix. Sigmoid colon diverticulosis. Vascular/Lymphatic: Aortoiliac atherosclerotic vascular disease. Reproductive: Unremarkable Other: No supplemental non-categorized findings. Musculoskeletal: Stable considerable lipoma along the right sartorius muscle. This is only partially included on today's exam. Grade 1  degenerative anterolisthesis at L3-4 and L4-5 along with spondylosis and degenerative disc disease most notable at at L2-3. Impingement noted at L3-4 and L4-5. IMPRESSION: 1. The right middle lobe mass is increased in volume by 43% compared to the examination from 3 months ago. There is new invasion of the right chest wall in the fourth intercostal space with associated erosions of the fourth and fifth ribs. 2. Stable regions of consolidation likely primarily from post radiation therapy findings in the right lung. 3. No overt thoracic adenopathy or findings of distant metastatic spread at this time. 4. A complex 1.6 by 1.2 by 1.3 cm cystic lesion of the right kidney upper pole posteriorly has a band of higher density centrally. This could reflect blood products or proteinaceous fluid, but I cannot completely exclude enhancement of this band or renal cell carcinoma. Renal protocol CT with and without contrast would probably be more effective than MRI in further characterization given the overall configuration. 5. Other imaging findings of potential clinical significance: Aortic Atherosclerosis (ICD10-I70.0). Coronary atherosclerosis. Stable left lower lobe nodules are likely benign based on calcifications and size, but merit surveillance. Lipomas of the left infraspinatus muscle and of the right sartorius muscle. Sigmoid colon diverticulosis. Impingement at L3-4 and L4-5. Electronically Signed   By: Van Clines M.D.   On: 05/14/2019 13:02   Ct Abdomen Pelvis W Contrast  Result Date: 05/14/2019 CLINICAL DATA:  Non-small cell lung cancer restaging. Ongoing immunotherapy. Chemotherapy and radiation therapy completed. EXAM: CT CHEST, ABDOMEN, AND PELVIS WITH CONTRAST TECHNIQUE: Multidetector CT imaging of the chest, abdomen and pelvis was performed following the standard protocol during bolus administration of intravenous contrast. CONTRAST:  169m OMNIPAQUE IOHEXOL 300 MG/ML  SOLN COMPARISON:  Multiple exams,  including 02/18/2019. FINDINGS: CT CHEST FINDINGS Cardiovascular: Right Port-A-Cath tip: SVC. Coronary, aortic arch, and branch vessel atherosclerotic vascular disease. Mediastinum/Nodes: Thyroidectomy. Rightward mediastinal shift. Right upper paratracheal node 0.7 cm in short axis on image 9/2. Subcarinal node 1.0 cm in short axis, 22/2. Small lower paraesophageal lymph nodes are present. Lungs/Pleura: Centrally necrotic right middle lobe lung mass measures 6.7 by 5.3 by 3.4 cm (volume = 63 cm^3) on image 26/2, formerly by my measurements 5.9 by 4.9 by 2.9 cm (volume = 44 cm^3). This  lesion invades the fourth intercostal space and the fourth and fifth ribs, a significant worsening from the prior exam. Adjacent pleural thickening noted. Consolidation posteriorly in the right upper lobe, similar to prior, with associated air bronchograms. There is also some consolidation along the margins of the right middle lobe mass, primarily in the anterior segment right lower lobe and in the right middle lobe, much of which may be from prior radiation therapy. A left lower lobe pulmonary nodule measuring 0.7 cm in diameter on image 80/6 is similar to the prior exam and has a large central calcification, probably a granuloma or pulmonary hamartoma. A separate left lower lobe 3 mm granuloma is present on image 90/6. A subpleural nodule measuring 0.4 by 0.3 cm is stable on image 82/6. No new left-sided pulmonary nodules are identified. Musculoskeletal: As noted above, there is bony erosion of the right fourth and fifth ribs which is new/increased due to adjacent tumor also invading the fourth intercostal space. Lipoma along the left infraspinatus/teres minor muscles. CT ABDOMEN PELVIS FINDINGS Hepatobiliary: Unremarkable Pancreas: Unremarkable Spleen: Unremarkable Adrenals/Urinary Tract: Both adrenal glands appear normal. Complex 1.6 by 1.2 by 1.3 cm lesion of the right kidney upper pole has a band of accentuated density internally  and this band could be significantly enhancing, or may simply represent blood products, for example on image 49/5. Simple appearing right kidney lower pole cyst. Stomach/Bowel: Thickening in the stomach antrum could be from antritis or simply from contraction. Normal appendix. Sigmoid colon diverticulosis. Vascular/Lymphatic: Aortoiliac atherosclerotic vascular disease. Reproductive: Unremarkable Other: No supplemental non-categorized findings. Musculoskeletal: Stable considerable lipoma along the right sartorius muscle. This is only partially included on today's exam. Grade 1 degenerative anterolisthesis at L3-4 and L4-5 along with spondylosis and degenerative disc disease most notable at at L2-3. Impingement noted at L3-4 and L4-5. IMPRESSION: 1. The right middle lobe mass is increased in volume by 43% compared to the examination from 3 months ago. There is new invasion of the right chest wall in the fourth intercostal space with associated erosions of the fourth and fifth ribs. 2. Stable regions of consolidation likely primarily from post radiation therapy findings in the right lung. 3. No overt thoracic adenopathy or findings of distant metastatic spread at this time. 4. A complex 1.6 by 1.2 by 1.3 cm cystic lesion of the right kidney upper pole posteriorly has a band of higher density centrally. This could reflect blood products or proteinaceous fluid, but I cannot completely exclude enhancement of this band or renal cell carcinoma. Renal protocol CT with and without contrast would probably be more effective than MRI in further characterization given the overall configuration. 5. Other imaging findings of potential clinical significance: Aortic Atherosclerosis (ICD10-I70.0). Coronary atherosclerosis. Stable left lower lobe nodules are likely benign based on calcifications and size, but merit surveillance. Lipomas of the left infraspinatus muscle and of the right sartorius muscle. Sigmoid colon diverticulosis.  Impingement at L3-4 and L4-5. Electronically Signed   By: Van Clines M.D.   On: 05/14/2019 13:02   Dg Chest Port 1 View  Result Date: 05/23/2019 CLINICAL DATA:  Fever.  On chemotherapy EXAM: PORTABLE CHEST 1 VIEW COMPARISON:  Chest CT from 9 days ago FINDINGS: Right-sided volume loss and hazy opacity from pulmonary fibrosis and pulmonary mass by comparison CT. The left lung is essentially clear. Normal heart size. Porta catheter on the right with tip at the SVC. IMPRESSION: Known right-sided mass and post treatment fibrosis. No acute finding when compared to prior. Electronically Signed  By: Monte Fantasia M.D.   On: 05/23/2019 10:24     ASSESSMENT/PLAN: This is a very pleasant 78 year old Caucasian female with recurrent non-small cell lung cancer, [place] cell carcinoma.  She was diagnosed in February 2015.  She presented with a right middle lobe lung mass and subcarinal lymphadenopathy.   She completed 6 cycles of systemic chemotherapy with carboplatin and paclitaxel.  She tolerated it well except for fatigue and peripheral neuropathy. She was on observation but showed evidence of disease progression.   She then was treated with second line immunotherapy with nivolumab 480 mg IV every 4 weeks.  She is status post 17 cycles. This was discontinued secondary to disease progression.   She is currently undergoing systemic chemotherapy with gemcitabine 1000 mg/m2 on days 1 and 8 every 3 weeks. She tolerated treatment well except she developed a fever the day following treatment which has since resolved.   Labs were reviewed with the patient today. The patient is feeling fairly well today. I recommend that the patient proceed with cycle #2 today as scheduled.   We will see the patient back for a follow up visit in 3 weeks before starting cycle #3.   The patient will meet with the nutritional team at the cancer center while in infusion next week for recommendations regarding her weight  loss and decreased appetite.   I encouraged the patient to please contact us if she develops and signs and symptoms of infection in the interval.   The patient's potassium was slightly low. She was encouraged to increase her intake of potassium rich food.   The patient was advised to call immediately if she has any concerning symptoms in the interval. The patient voices understanding of current disease status and treatment options and is in agreement with the current care plan. All questions were answered. The patient knows to call the clinic with any problems, questions or concerns. We can certainly see the patient much sooner if necessary   No orders of the defined types were placed in this encounter.    Lilton Pare L Dewey Viens, PA-C 06/12/19

## 2019-06-12 NOTE — Telephone Encounter (Signed)
Scheduled appt per 10/8 los - pt to get an updated schedule next visit.   

## 2019-06-12 NOTE — Patient Instructions (Signed)
[facility] Discharge Instructions for Patients Receiving Chemotherapy  Today you received the following chemotherapy agents Gemzar  To help prevent nausea and vomiting after your treatment, we encourage you to take your nausea medication as directed.  If you develop nausea and vomiting that is not controlled by your nausea medication, call the clinic.   BELOW ARE SYMPTOMS THAT SHOULD BE REPORTED IMMEDIATELY:  *FEVER GREATER THAN 100.5 F  *CHILLS WITH OR WITHOUT FEVER  NAUSEA AND VOMITING THAT IS NOT CONTROLLED WITH YOUR NAUSEA MEDICATION  *UNUSUAL SHORTNESS OF BREATH  *UNUSUAL BRUISING OR BLEEDING  TENDERNESS IN MOUTH AND THROAT WITH OR WITHOUT PRESENCE OF ULCERS  *URINARY PROBLEMS  *BOWEL PROBLEMS  UNUSUAL RASH Items with * indicate a potential emergency and should be followed up as soon as possible.  Feel free to call the clinic should you have any questions or concerns. The clinic phone number is [phone].  Please show the [place] at check-in to the Emergency Department and triage nurse.  Gemcitabine injection(Gemzar) What is this medicine? GEMCITABINE (jem SYE ta been) is a chemotherapy drug. This medicine is used to treat many types of cancer like breast cancer, lung cancer, pancreatic cancer, and ovarian cancer. This medicine may be used for other purposes; ask your health care provider or pharmacist if you have questions. COMMON BRAND NAME(S): Gemzar, Infugem What should I tell my health care provider before I take this medicine? They need to know if you have any of these conditions:  blood disorders  infection  kidney disease  liver disease  lung or breathing disease, like asthma  recent or ongoing radiation therapy  an unusual or allergic reaction to gemcitabine, other chemotherapy, other medicines, foods, dyes, or preservatives  pregnant or trying to get pregnant  breast-feeding How should I use this medicine? This  drug is given as an infusion into a vein. It is administered in a hospital or clinic by a specially trained health care professional. Talk to your pediatrician regarding the use of this medicine in children. Special care may be needed. Overdosage: If you think you have taken too much of this medicine contact a poison control center or emergency room at once. NOTE: This medicine is only for you. Do not share this medicine with others. What if I miss a dose? It is important not to miss your dose. Call your doctor or health care professional if you are unable to keep an appointment. What may interact with this medicine?  medicines to increase blood counts like filgrastim, pegfilgrastim, sargramostim  some other chemotherapy drugs like cisplatin  vaccines Talk to your doctor or health care professional before taking any of these medicines:  acetaminophen  aspirin  ibuprofen  ketoprofen  naproxen This list may not describe all possible interactions. Give your health care provider a list of all the medicines, herbs, non-prescription drugs, or dietary supplements you use. Also tell them if you smoke, drink alcohol, or use illegal drugs. Some items may interact with your medicine. What should I watch for while using this medicine? Visit your doctor for checks on your progress. This drug may make you feel generally unwell. This is not uncommon, as chemotherapy can affect healthy cells as well as cancer cells. Report any side effects. Continue your course of treatment even though you feel ill unless your doctor tells you to stop. In some cases, you may be given additional medicines to help with side effects. Follow all directions for their use. Call your  doctor or health care professional for advice if you get a fever, chills or sore throat, or other symptoms of a cold or flu. Do not treat yourself. This drug decreases your body's ability to fight infections. Try to avoid being around people who  are sick. This medicine may increase your risk to bruise or bleed. Call your doctor or health care professional if you notice any unusual bleeding. Be careful brushing and flossing your teeth or using a toothpick because you may get an infection or bleed more easily. If you have any dental work done, tell your dentist you are receiving this medicine. Avoid taking products that contain aspirin, acetaminophen, ibuprofen, naproxen, or ketoprofen unless instructed by your doctor. These medicines may hide a fever. Do not become pregnant while taking this medicine or for 6 months after stopping it. Women should inform their doctor if they wish to become pregnant or think they might be pregnant. Men should not father a child while taking this medicine and for 3 months after stopping it. There is a potential for serious side effects to an unborn child. Talk to your health care professional or pharmacist for more information. Do not breast-feed an infant while taking this medicine or for at least 1 week after stopping it. Men should inform their doctors if they wish to father a child. This medicine may lower sperm counts. Talk with your doctor or health care professional if you are concerned about your fertility. What side effects may I notice from receiving this medicine? Side effects that you should report to your doctor or health care professional as soon as possible:  allergic reactions like skin rash, itching or hives, swelling of the face, lips, or tongue  breathing problems  pain, redness, or irritation at site where injected  signs and symptoms of a dangerous change in heartbeat or heart rhythm like chest pain; dizziness; fast or irregular heartbeat; palpitations; feeling faint or lightheaded, falls; breathing problems  signs of decreased platelets or bleeding - bruising, pinpoint red spots on the skin, black, tarry stools, blood in the urine  signs of decreased red blood cells - unusually weak or  tired, feeling faint or lightheaded, falls  signs of infection - fever or chills, cough, sore throat, pain or difficulty passing urine  signs and symptoms of kidney injury like trouble passing urine or change in the amount of urine  signs and symptoms of liver injury like dark yellow or brown urine; general ill feeling or flu-like symptoms; light-colored stools; loss of appetite; nausea; right upper belly pain; unusually weak or tired; yellowing of the eyes or skin  swelling of ankles, feet, hands Side effects that usually do not require medical attention (report to your doctor or health care professional if they continue or are bothersome):  constipation  diarrhea  hair loss  loss of appetite  nausea  rash  vomiting This list may not describe all possible side effects. Call your doctor for medical advice about side effects. You may report side effects to FDA at 1-800-FDA-1088. Where should I keep my medicine? This drug is given in a hospital or clinic and will not be stored at home. NOTE: This sheet is a summary. It may not cover all possible information. If you have questions about this medicine, talk to your doctor, pharmacist, or health care provider.  2020 Elsevier/Gold Standard (2017-11-14 18:06:11)

## 2019-06-17 DIAGNOSIS — J9611 Chronic respiratory failure with hypoxia: Secondary | ICD-10-CM | POA: Diagnosis not present

## 2019-06-17 DIAGNOSIS — I10 Essential (primary) hypertension: Secondary | ICD-10-CM | POA: Diagnosis not present

## 2019-06-17 DIAGNOSIS — C3490 Malignant neoplasm of unspecified part of unspecified bronchus or lung: Secondary | ICD-10-CM | POA: Diagnosis not present

## 2019-06-17 DIAGNOSIS — E039 Hypothyroidism, unspecified: Secondary | ICD-10-CM | POA: Diagnosis not present

## 2019-06-17 DIAGNOSIS — D649 Anemia, unspecified: Secondary | ICD-10-CM | POA: Diagnosis not present

## 2019-06-17 DIAGNOSIS — E1122 Type 2 diabetes mellitus with diabetic chronic kidney disease: Secondary | ICD-10-CM | POA: Diagnosis not present

## 2019-06-17 DIAGNOSIS — E7800 Pure hypercholesterolemia, unspecified: Secondary | ICD-10-CM | POA: Diagnosis not present

## 2019-06-17 DIAGNOSIS — E559 Vitamin D deficiency, unspecified: Secondary | ICD-10-CM | POA: Diagnosis not present

## 2019-06-18 ENCOUNTER — Other Ambulatory Visit: Payer: Self-pay | Admitting: Lab

## 2019-06-19 ENCOUNTER — Inpatient Hospital Stay: Payer: Medicare Other

## 2019-06-19 ENCOUNTER — Inpatient Hospital Stay: Payer: Medicare Other | Admitting: Nutrition

## 2019-06-19 ENCOUNTER — Other Ambulatory Visit: Payer: Self-pay

## 2019-06-19 VITALS — BP 139/69 | HR 80 | Temp 98.3°F | Resp 16

## 2019-06-19 DIAGNOSIS — Z5111 Encounter for antineoplastic chemotherapy: Secondary | ICD-10-CM | POA: Diagnosis not present

## 2019-06-19 DIAGNOSIS — Z7951 Long term (current) use of inhaled steroids: Secondary | ICD-10-CM | POA: Diagnosis not present

## 2019-06-19 DIAGNOSIS — Z79899 Other long term (current) drug therapy: Secondary | ICD-10-CM | POA: Diagnosis not present

## 2019-06-19 DIAGNOSIS — E785 Hyperlipidemia, unspecified: Secondary | ICD-10-CM | POA: Diagnosis not present

## 2019-06-19 DIAGNOSIS — C3490 Malignant neoplasm of unspecified part of unspecified bronchus or lung: Secondary | ICD-10-CM

## 2019-06-19 DIAGNOSIS — E039 Hypothyroidism, unspecified: Secondary | ICD-10-CM | POA: Diagnosis not present

## 2019-06-19 DIAGNOSIS — I4891 Unspecified atrial fibrillation: Secondary | ICD-10-CM | POA: Diagnosis not present

## 2019-06-19 DIAGNOSIS — Z923 Personal history of irradiation: Secondary | ICD-10-CM | POA: Diagnosis not present

## 2019-06-19 DIAGNOSIS — E860 Dehydration: Secondary | ICD-10-CM

## 2019-06-19 DIAGNOSIS — J45909 Unspecified asthma, uncomplicated: Secondary | ICD-10-CM | POA: Diagnosis not present

## 2019-06-19 DIAGNOSIS — C342 Malignant neoplasm of middle lobe, bronchus or lung: Secondary | ICD-10-CM

## 2019-06-19 DIAGNOSIS — I10 Essential (primary) hypertension: Secondary | ICD-10-CM | POA: Diagnosis not present

## 2019-06-19 LAB — CBC WITH DIFFERENTIAL (CANCER CENTER ONLY)
Abs Immature Granulocytes: 0.05 10*3/uL (ref 0.00–0.07)
Basophils Absolute: 0.1 10*3/uL (ref 0.0–0.1)
Basophils Relative: 1 %
Eosinophils Absolute: 0 10*3/uL (ref 0.0–0.5)
Eosinophils Relative: 1 %
HCT: 27.8 % — ABNORMAL LOW (ref 36.0–46.0)
Hemoglobin: 9.4 g/dL — ABNORMAL LOW (ref 12.0–15.0)
Immature Granulocytes: 1 %
Lymphocytes Relative: 19 %
Lymphs Abs: 1.2 10*3/uL (ref 0.7–4.0)
MCH: 31.2 pg (ref 26.0–34.0)
MCHC: 33.8 g/dL (ref 30.0–36.0)
MCV: 92.4 fL (ref 80.0–100.0)
Monocytes Absolute: 0.8 10*3/uL (ref 0.1–1.0)
Monocytes Relative: 12 %
Neutro Abs: 4.4 10*3/uL (ref 1.7–7.7)
Neutrophils Relative %: 66 %
Platelet Count: 512 10*3/uL — ABNORMAL HIGH (ref 150–400)
RBC: 3.01 MIL/uL — ABNORMAL LOW (ref 3.87–5.11)
RDW: 13.6 % (ref 11.5–15.5)
WBC Count: 6.5 10*3/uL (ref 4.0–10.5)
nRBC: 0.3 % — ABNORMAL HIGH (ref 0.0–0.2)

## 2019-06-19 LAB — CMP (CANCER CENTER ONLY)
ALT: 13 U/L (ref 0–44)
AST: 16 U/L (ref 15–41)
Albumin: 3.2 g/dL — ABNORMAL LOW (ref 3.5–5.0)
Alkaline Phosphatase: 135 U/L — ABNORMAL HIGH (ref 38–126)
Anion gap: 12 (ref 5–15)
BUN: 19 mg/dL (ref 8–23)
CO2: 30 mmol/L (ref 22–32)
Calcium: 9.5 mg/dL (ref 8.9–10.3)
Chloride: 93 mmol/L — ABNORMAL LOW (ref 98–111)
Creatinine: 0.92 mg/dL (ref 0.44–1.00)
GFR, Est AFR Am: >60 mL/min (ref >60)
GFR, Estimated: >60 mL/min (ref >60)
Glucose, Bld: 109 mg/dL — ABNORMAL HIGH (ref 70–99)
Potassium: 3.3 mmol/L — ABNORMAL LOW (ref 3.5–5.1)
Sodium: 135 mmol/L (ref 135–145)
Total Bilirubin: 0.3 mg/dL (ref 0.3–1.2)
Total Protein: 7 g/dL (ref 6.5–8.1)

## 2019-06-19 MED ORDER — SODIUM CHLORIDE 0.9% FLUSH
10.0000 mL | INTRAVENOUS | Status: DC | PRN
  Administered 2019-06-19: 10 mL
  Filled 2019-06-19: qty 10

## 2019-06-19 MED ORDER — SODIUM CHLORIDE 0.9 % IV SOLN
Freq: Once | INTRAVENOUS | Status: AC
  Administered 2019-06-19: 14:00:00 via INTRAVENOUS
  Filled 2019-06-19: qty 250

## 2019-06-19 MED ORDER — PROCHLORPERAZINE MALEATE 10 MG PO TABS
10.0000 mg | ORAL_TABLET | Freq: Once | ORAL | Status: AC
  Administered 2019-06-19: 10 mg via ORAL

## 2019-06-19 MED ORDER — SODIUM CHLORIDE 0.9 % IV SOLN
1000.0000 mg/m2 | Freq: Once | INTRAVENOUS | Status: AC
  Administered 2019-06-19: 1634 mg via INTRAVENOUS
  Filled 2019-06-19: qty 42.98

## 2019-06-19 MED ORDER — HEPARIN SOD (PORK) LOCK FLUSH 100 UNIT/ML IV SOLN
500.0000 [IU] | Freq: Once | INTRAVENOUS | Status: AC | PRN
  Administered 2019-06-19: 500 [IU]
  Filled 2019-06-19: qty 5

## 2019-06-19 MED ORDER — PROCHLORPERAZINE MALEATE 10 MG PO TABS
ORAL_TABLET | ORAL | Status: AC
  Filled 2019-06-19: qty 1

## 2019-06-19 NOTE — Progress Notes (Signed)
Nutrition follow-up completed with patient during infusion for recurrent non-small cell lung cancer. Weight was documented as 135.9 pounds October 9 decreased from 145 pounds in July. Noted labs potassium 3.3, glucose 109, and albumin 3.2.  Patient reports her nausea has resolved with the discontinuation of immunotherapy. She complains of early satiety. Reports lactose intolerance but not dairy allergy.  She states she tolerates Ensure.  Nutrition diagnosis: Food and nutrition related knowledge deficit continues.  Intervention: Patient was educated on consuming smaller, more frequent meals and snacks. Reviewed high-calorie, high-protein, smaller meals and snacks. Educated patient that oral nutrition supplements such as Ensure and boost are lactose-free.  Provided samples and coupons. Questions were answered.  Teach back method used.  Monitoring, evaluation, goals: Patient will tolerate increased calories and protein to minimize further weight loss and lean body mass.  Next visit: Thursday, October 29 during infusion.  **Disclaimer: This note was dictated with voice recognition software. Similar sounding words can inadvertently be transcribed and this note may contain transcription errors which may not have been corrected upon publication of note.**

## 2019-06-24 DIAGNOSIS — Z9221 Personal history of antineoplastic chemotherapy: Secondary | ICD-10-CM | POA: Diagnosis not present

## 2019-06-24 DIAGNOSIS — C3490 Malignant neoplasm of unspecified part of unspecified bronchus or lung: Secondary | ICD-10-CM | POA: Diagnosis not present

## 2019-06-24 DIAGNOSIS — I10 Essential (primary) hypertension: Secondary | ICD-10-CM | POA: Diagnosis not present

## 2019-06-24 DIAGNOSIS — Z0000 Encounter for general adult medical examination without abnormal findings: Secondary | ICD-10-CM | POA: Diagnosis not present

## 2019-06-24 DIAGNOSIS — E7800 Pure hypercholesterolemia, unspecified: Secondary | ICD-10-CM | POA: Diagnosis not present

## 2019-06-24 DIAGNOSIS — R748 Abnormal levels of other serum enzymes: Secondary | ICD-10-CM | POA: Diagnosis not present

## 2019-06-24 DIAGNOSIS — E039 Hypothyroidism, unspecified: Secondary | ICD-10-CM | POA: Diagnosis not present

## 2019-07-03 ENCOUNTER — Inpatient Hospital Stay: Payer: Medicare Other | Admitting: Nutrition

## 2019-07-03 ENCOUNTER — Inpatient Hospital Stay: Payer: Medicare Other

## 2019-07-03 ENCOUNTER — Inpatient Hospital Stay (HOSPITAL_BASED_OUTPATIENT_CLINIC_OR_DEPARTMENT_OTHER): Payer: Medicare Other | Admitting: Internal Medicine

## 2019-07-03 ENCOUNTER — Encounter: Payer: Self-pay | Admitting: Internal Medicine

## 2019-07-03 ENCOUNTER — Other Ambulatory Visit: Payer: Self-pay

## 2019-07-03 VITALS — BP 125/67 | HR 86 | Temp 98.3°F | Resp 18 | Ht 59.0 in | Wt 133.6 lb

## 2019-07-03 DIAGNOSIS — I10 Essential (primary) hypertension: Secondary | ICD-10-CM | POA: Diagnosis not present

## 2019-07-03 DIAGNOSIS — D649 Anemia, unspecified: Secondary | ICD-10-CM

## 2019-07-03 DIAGNOSIS — E039 Hypothyroidism, unspecified: Secondary | ICD-10-CM | POA: Diagnosis not present

## 2019-07-03 DIAGNOSIS — C3490 Malignant neoplasm of unspecified part of unspecified bronchus or lung: Secondary | ICD-10-CM

## 2019-07-03 DIAGNOSIS — J45909 Unspecified asthma, uncomplicated: Secondary | ICD-10-CM | POA: Diagnosis not present

## 2019-07-03 DIAGNOSIS — Z79899 Other long term (current) drug therapy: Secondary | ICD-10-CM | POA: Diagnosis not present

## 2019-07-03 DIAGNOSIS — Z923 Personal history of irradiation: Secondary | ICD-10-CM | POA: Diagnosis not present

## 2019-07-03 DIAGNOSIS — C342 Malignant neoplasm of middle lobe, bronchus or lung: Secondary | ICD-10-CM

## 2019-07-03 DIAGNOSIS — E860 Dehydration: Secondary | ICD-10-CM

## 2019-07-03 DIAGNOSIS — Z5111 Encounter for antineoplastic chemotherapy: Secondary | ICD-10-CM | POA: Diagnosis not present

## 2019-07-03 DIAGNOSIS — I4891 Unspecified atrial fibrillation: Secondary | ICD-10-CM | POA: Diagnosis not present

## 2019-07-03 DIAGNOSIS — E785 Hyperlipidemia, unspecified: Secondary | ICD-10-CM | POA: Diagnosis not present

## 2019-07-03 DIAGNOSIS — Z7951 Long term (current) use of inhaled steroids: Secondary | ICD-10-CM | POA: Diagnosis not present

## 2019-07-03 LAB — CBC WITH DIFFERENTIAL (CANCER CENTER ONLY)
Abs Immature Granulocytes: 0.1 10*3/uL — ABNORMAL HIGH (ref 0.00–0.07)
Basophils Absolute: 0.1 10*3/uL (ref 0.0–0.1)
Basophils Relative: 1 %
Eosinophils Absolute: 0.2 10*3/uL (ref 0.0–0.5)
Eosinophils Relative: 2 %
HCT: 28.1 % — ABNORMAL LOW (ref 36.0–46.0)
Hemoglobin: 9.3 g/dL — ABNORMAL LOW (ref 12.0–15.0)
Immature Granulocytes: 1 %
Lymphocytes Relative: 9 %
Lymphs Abs: 1.1 10*3/uL (ref 0.7–4.0)
MCH: 31.2 pg (ref 26.0–34.0)
MCHC: 33.1 g/dL (ref 30.0–36.0)
MCV: 94.3 fL (ref 80.0–100.0)
Monocytes Absolute: 0.9 10*3/uL (ref 0.1–1.0)
Monocytes Relative: 8 %
Neutro Abs: 9.1 10*3/uL — ABNORMAL HIGH (ref 1.7–7.7)
Neutrophils Relative %: 79 %
Platelet Count: 520 10*3/uL — ABNORMAL HIGH (ref 150–400)
RBC: 2.98 MIL/uL — ABNORMAL LOW (ref 3.87–5.11)
RDW: 15.9 % — ABNORMAL HIGH (ref 11.5–15.5)
WBC Count: 11.4 10*3/uL — ABNORMAL HIGH (ref 4.0–10.5)
nRBC: 0 % (ref 0.0–0.2)

## 2019-07-03 LAB — CMP (CANCER CENTER ONLY)
ALT: 8 U/L (ref 0–44)
AST: 11 U/L — ABNORMAL LOW (ref 15–41)
Albumin: 3.2 g/dL — ABNORMAL LOW (ref 3.5–5.0)
Alkaline Phosphatase: 129 U/L — ABNORMAL HIGH (ref 38–126)
Anion gap: 12 (ref 5–15)
BUN: 21 mg/dL (ref 8–23)
CO2: 27 mmol/L (ref 22–32)
Calcium: 9.5 mg/dL (ref 8.9–10.3)
Chloride: 97 mmol/L — ABNORMAL LOW (ref 98–111)
Creatinine: 0.96 mg/dL (ref 0.44–1.00)
GFR, Est AFR Am: >60 mL/min (ref >60)
GFR, Estimated: 58 mL/min — ABNORMAL LOW (ref >60)
Glucose, Bld: 100 mg/dL — ABNORMAL HIGH (ref 70–99)
Potassium: 3.3 mmol/L — ABNORMAL LOW (ref 3.5–5.1)
Sodium: 136 mmol/L (ref 135–145)
Total Bilirubin: 0.3 mg/dL (ref 0.3–1.2)
Total Protein: 6.8 g/dL (ref 6.5–8.1)

## 2019-07-03 MED ORDER — HEPARIN SOD (PORK) LOCK FLUSH 100 UNIT/ML IV SOLN
500.0000 [IU] | Freq: Once | INTRAVENOUS | Status: AC | PRN
  Administered 2019-07-03: 500 [IU]
  Filled 2019-07-03: qty 5

## 2019-07-03 MED ORDER — SODIUM CHLORIDE 0.9% FLUSH
10.0000 mL | INTRAVENOUS | Status: DC | PRN
  Administered 2019-07-03: 11:00:00 10 mL
  Filled 2019-07-03: qty 10

## 2019-07-03 MED ORDER — SODIUM CHLORIDE 0.9 % IV SOLN
1000.0000 mg/m2 | Freq: Once | INTRAVENOUS | Status: AC
  Administered 2019-07-03: 1634 mg via INTRAVENOUS
  Filled 2019-07-03: qty 42.98

## 2019-07-03 MED ORDER — PROCHLORPERAZINE MALEATE 10 MG PO TABS
10.0000 mg | ORAL_TABLET | Freq: Once | ORAL | Status: AC
  Administered 2019-07-03: 10 mg via ORAL

## 2019-07-03 MED ORDER — SODIUM CHLORIDE 0.9 % IV SOLN
Freq: Once | INTRAVENOUS | Status: AC
  Administered 2019-07-03: 12:00:00 via INTRAVENOUS
  Filled 2019-07-03: qty 250

## 2019-07-03 MED ORDER — SODIUM CHLORIDE 0.9% FLUSH
10.0000 mL | INTRAVENOUS | Status: DC | PRN
  Administered 2019-07-03: 10 mL
  Filled 2019-07-03: qty 10

## 2019-07-03 MED ORDER — PROCHLORPERAZINE MALEATE 10 MG PO TABS
ORAL_TABLET | ORAL | Status: AC
  Filled 2019-07-03: qty 1

## 2019-07-03 NOTE — Progress Notes (Signed)
Nutrition follow-up completed with patient during infusion for recurrent non-small cell lung cancer. Weight was decreased to 133.6 pounds October 29 down from 135.9 pounds October 9. Patient is surprised that she has had some weight loss because she feels she is eating more. She continues to have nausea, especially if she drinks Ensure. Noted labs: Potassium 3.3, glucose 100, albumin 3.2. Patient continues to try to increase oral intake applying strategies from previous nutrition visits.  Nutrition diagnosis: Food and nutrition related knowledge deficit has improved.  Intervention: Provided patient with support and encouragement to continue strategies for adequate calorie and protein intake. Recommended high-calorie, high-protein foods and Ensure/boost as tolerated. Encourage patient to contact me if she developed any questions.  Monitoring, evaluation, goals: Patient will work to increase calories and protein to minimize weight loss.  Next visit: To be scheduled as needed.  Patient has my contact information for questions.  **Disclaimer: This note was dictated with voice recognition software. Similar sounding words can inadvertently be transcribed and this note may contain transcription errors which may not have been corrected upon publication of note.**

## 2019-07-03 NOTE — Progress Notes (Signed)
[facility] [phone]   Fax:([phone]) Emmett, MD [address] 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  PRIOR THERAPY: 1) Status post exploratory right VATS with mediastinal biopsy under the care of Dr. Roxan Hockey on 11/13/2013. The tumor was found to be unresectable at that time.[name] 2) Concurrent chemoradiation with weekly carboplatin for AUC of 2 and paclitaxel 45 mg/M2, status post 6 cycles with partial response. First cycle was given on 12/01/2013. 3) Consolidation chemotherapy with carboplatin for AUC of 5 and paclitaxel 175 mg/M2 every 3 weeks with Neulasta support. First dose 02/23/2014. Status post 3 cycles with partial response. 4) Systemic chemotherapy with carboplatin for AUC of 5 and paclitaxel 175 MG/M2 every 3 weeks with Neulasta support. Status post 6 cycles. First dose was given on 08/14/2016.  Last dose was giving 12/11/2016 with partial response. 5)Second line treatment with immunotherapy with Nivolumab 480 mg IV every 4 weeks, first dose Jan 25, 2018. Status post 17cycles  CURRENT THERAPY: Systemic chemotherapy with gemcitabine 1000 mg/m on days 1 and 8 every 3 weeks. First dose on 05/22/2019.Status post 2 cycles.   INTERVAL HISTORY: Kimberly [name] 78 y.o. female returns to the clinic today for follow-up visit.  The patient is feeling fine today with no concerning complaints except for fever 4 to 3 days after the treatment.  She takes Tylenol and feels better.  She denied having any current chest pain but has the baseline shortness of breath with no cough or hemoptysis.  She denied having any fever or chills.  She has no nausea, vomiting, diarrhea or constipation.  She has no headache or visual changes.  She is here today for evaluation before starting cycle #3 of  her treatment.    MEDICAL HISTORY: Past Medical History:  Diagnosis Date  . Allergic asthma without acute exacerbation or status asthmaticus   . Aortic insufficiency   . Arthritis   . Atrial fibrillation ([place])   . Back pain 08/14/2016  . Bronchitis   . Cough    dry, endobronchial mass  . Dyslipidemia   . GERD (gastroesophageal reflux disease)   . Heart murmur   . History of blood transfusion   . History of bronchitis   . History of chemotherapy   . History of nuclear stress test 02/26/2006   exercise myoview; normal pattern of perfusion; low risk scan   . Hypertension   . Hypothyroidism   . Mitral insufficiency   . Pneumonia   . PONV (postoperative nausea and vomiting)   . Radiation 12/01/13-01/08/14   50.4 gray to right central chest  . Renal mass, left 04/12/2017  . Shortness of breath    with exertion  . Squamous cell carcinoma of lung ([place])   . Syncope    "states she has passed out a few times. dr is trying to find cause.last time when geeting ready to go home after video bronch/bx    ALLERGIES:  is allergic to lactose intolerance (gi); amiodarone; augmentin [amoxicillin-pot clavulanate]; milk-related compounds; and oxycontin  [oxycodone hcl].  MEDICATIONS:  Current Outpatient Medications  Medication Sig Dispense Refill  . albuterol (VENTOLIN HFA) 108 (90 Base) MCG/ACT inhaler Inhale 1 puff into the lungs every 6 (six) hours as needed (before walking or heavy activity [try to limit to twice daily]). 1 Inhaler 5  . amLODipine (NORVASC)  5 MG tablet TAKE 1 TABLET BY MOUTH EVERY DAY 90 tablet 0  . atorvastatin (LIPITOR) 20 MG tablet Take 20 mg by mouth daily.     . chlorthalidone (HYGROTON) 25 MG tablet Take 25 mg by mouth daily.    . fluticasone (FLONASE) 50 MCG/ACT nasal spray Place 2 sprays into both nostrils daily as needed (allergies.).     [name] levothyroxine (SYNTHROID, LEVOTHROID) 100 MCG tablet Take 100 mcg by mouth every evening.     . lidocaine-prilocaine (EMLA)  cream Apply 1 application topically as needed. (Patient taking differently: Apply 1 application topically as needed (for port access). ) 30 g 0  . metoprolol succinate (TOPROL-XL) 25 MG 24 hr tablet Take 1 tablet (25 mg total) by mouth daily. 90 tablet 3  . Mometasone Furoate (ASMANEX HFA) 100 MCG/ACT AERO Inhale 2 puffs into the lungs 2 (two) times daily. 39 g 3  . montelukast (SINGULAIR) 10 MG tablet Take 10 mg by mouth at bedtime.     . Multiple Vitamins-Iron (MULTIVITAMIN/IRON PO) Take 1 tablet by mouth daily with breakfast.     . ondansetron (ZOFRAN) 4 MG tablet Take 4 mg by mouth 3 (three) times daily as needed for nausea/vomiting.    . pantoprazole (PROTONIX) 40 MG tablet TAKE 1 TABLET BY MOUTH  TWICE DAILY 120 tablet 3  . potassium chloride (K-DUR) 10 MEQ tablet Take 2 tablets (20 mEq total) by mouth 2 (two) times daily for 10 days. (Patient not taking: Reported on 06/12/2019) 40 tablet 0  . prochlorperazine (COMPAZINE) 10 MG tablet Take 1 tablet (10 mg total) by mouth every 6 (six) hours as needed for nausea or vomiting. 30 tablet 0  . promethazine (PHENERGAN) 25 MG tablet Take 25 mg by mouth 4 (four) times daily as needed for nausea/vomiting.    . sucralfate (CARAFATE) 1 g tablet Take 1 g by mouth 4 (four) times daily -  with meals and at bedtime.    . traMADol (ULTRAM) 50 MG tablet Take 25-50 mg by mouth every 8 (eight) hours as needed (severe pain.).   3   No current facility-administered medications for this visit.     SURGICAL HISTORY:  Past Surgical History:  Procedure Laterality Date  . CARDIAC CATHETERIZATION  07/08/2008   normal coronaries  . CARPAL TUNNEL RELEASE Right 1988  . COLONOSCOPY N/A 02/11/2016   Procedure: COLONOSCOPY;  Surgeon: Carol Ada, MD;  Location: [facility];  Service: Endoscopy;  Laterality: N/A;  . DILATION AND CURETTAGE OF UTERUS    . ESOPHAGOGASTRODUODENOSCOPY N/A 02/08/2017   Procedure: ESOPHAGOGASTRODUODENOSCOPY (EGD);  Surgeon: Carol Ada, MD;   Location: [facility];  Service: Endoscopy;  Laterality: N/A;  . ESOPHAGOGASTRODUODENOSCOPY (EGD) WITH PROPOFOL N/A 03/21/2019   Procedure: ESOPHAGOGASTRODUODENOSCOPY (EGD) WITH PROPOFOL;  Surgeon: Carol Ada, MD;  Location: [facility];  Service: Endoscopy;  Laterality: N/A;  . EYE SURGERY Bilateral   . H/O MET Test w/PFT  04/02/2012   low risk; peak VO2 77% predicted  . IR GENERIC HISTORICAL  09/12/2016   IR FLUORO GUIDE PORT INSERTION RIGHT 09/12/2016 [name], MD [facility]  . IR GENERIC HISTORICAL  09/12/2016   IR US GUIDE VASC ACCESS RIGHT 09/12/2016 [name], MD [facility]  . JOINT REPLACEMENT  2003   thumb rt  . KNEE ARTHROSCOPY  12   rt meniscus  . MEDIASTINOSCOPY N/A 10/30/2013   Procedure: MEDIASTINOSCOPY;  Surgeon: Melrose Nakayama, MD;  Location: [facility];  Service: Thoracic;  Laterality: N/A;  . ROTATOR  CUFF REPAIR  2006   ? side  . THYROIDECTOMY  1973  . TRANSTHORACIC ECHOCARDIOGRAM  09/25/2012   EF 55-60%; mild LVH & mild concentric hypertrophy; mild AV regurg; RV systolic pressure increase consistent with mild pulm HTN  . VIDEO ASSISTED THORACOSCOPY (VATS)/ LOBECTOMY Right 11/13/2013   Procedure: VIDEO ASSISTED THORACOSCOPY (VATS) with mediastinal  biopsies;  Surgeon: Melrose Nakayama, MD;  Location: [facility];  Service: Thoracic;  Laterality: Right;  RIGHT VATS,mediastinal biopsies  . VIDEO BRONCHOSCOPY Bilateral 09/25/2013   Procedure: VIDEO BRONCHOSCOPY WITHOUT FLUORO;  Surgeon: Tanda Rockers, MD;  Location: [facility];  Service: Cardiopulmonary;  Laterality: Bilateral;  . VIDEO BRONCHOSCOPY WITH ENDOBRONCHIAL ULTRASOUND N/A 10/30/2013   Procedure: VIDEO BRONCHOSCOPY WITH ENDOBRONCHIAL ULTRASOUND;  Surgeon: Melrose Nakayama, MD;  Location: [facility];  Service: Thoracic;  Laterality: N/A;    REVIEW OF SYSTEMS:  A comprehensive review of systems was negative except for: Constitutional: positive for fatigue Respiratory: positive for dyspnea on  exertion Gastrointestinal: positive for nausea   PHYSICAL EXAMINATION: General appearance: alert, cooperative, fatigued and no distress Head: Normocephalic, without obvious abnormality, atraumatic Neck: no adenopathy, no JVD, supple, symmetrical, trachea midline and thyroid not enlarged, symmetric, no tenderness/mass/nodules Lymph nodes: Cervical, supraclavicular, and axillary nodes normal. Resp: clear to auscultation bilaterally Back: symmetric, no curvature. ROM normal. No CVA tenderness. Cardio: regular rate and rhythm, S1, S2 normal, no murmur, click, rub or gallop GI: soft, non-tender; bowel sounds normal; no masses,  no organomegaly Extremities: extremities normal, atraumatic, no cyanosis or edema  ECOG PERFORMANCE STATUS: 1 - Symptomatic but completely ambulatory  Blood pressure 125/67, pulse 86, temperature 98.3 F (36.8 C), temperature source Temporal, resp. rate 18, height 4' 11" (1.499 m), weight 133 lb 9.6 oz (60.6 kg), SpO2 100 %.  LABORATORY DATA: Lab Results  Component Value Date   WBC 11.4 (H) 07/03/2019   HGB 9.3 (L) 07/03/2019   HCT 28.1 (L) 07/03/2019   MCV 94.3 07/03/2019   PLT 520 (H) 07/03/2019      Chemistry      Component Value Date/Time   NA 135 06/19/2019 1300   NA 140 07/12/2017 1212   K 3.3 (L) 06/19/2019 1300   K 3.5 07/12/2017 1212   CL 93 (L) 06/19/2019 1300   CO2 30 06/19/2019 1300   CO2 27 07/12/2017 1212   BUN 19 06/19/2019 1300   BUN 27.5 (H) 07/12/2017 1212   CREATININE 0.92 06/19/2019 1300   CREATININE 1.0 07/12/2017 1212      Component Value Date/Time   CALCIUM 9.5 06/19/2019 1300   CALCIUM 10.0 07/12/2017 1212   ALKPHOS 135 (H) 06/19/2019 1300   ALKPHOS 119 07/12/2017 1212   AST 16 06/19/2019 1300   AST 16 07/12/2017 1212   ALT 13 06/19/2019 1300   ALT 15 07/12/2017 1212   BILITOT 0.3 06/19/2019 1300   BILITOT 0.48 07/12/2017 1212       RADIOGRAPHIC STUDIES: No results found.   ASSESSMENT AND PLAN: This is a very  pleasant 78 years old white female with recurrent non-small cell lung cancer, squamous cell carcinoma. The patient completed 6 cycles of systemic chemotherapy with carboplatin and paclitaxel and tolerated her treatment well except for fatigue as well as peripheral neuropathy. The patient has been in observation.  She continues to have persistent dry cough and shortness of breath. She had evidence for disease progression on restaging scan. The patient was started on treatment with second line immunotherapy with Nivolumab 480 mg IV every 4  weeks status post 17 cycles.  This treatment was discontinued secondary to disease progression. She is currently on treatment with systemic chemotherapy with single agent gemcitabine 1000 mg/M2 on days 1 and 8 every 3 weeks is status post 2 cycles.  She has been tolerating the treatment well except for fatigue and fever for 2-3 days likely secondary to tumor fever. I recommended for the patient to proceed with cycle #3 today as planned. She will come back for follow-up visit in 3 weeks for evaluation with repeat CT scan of the chest, abdomen pelvis for restaging of her disease. For the persistent nausea and vomiting, she was seen by Dr. Benson Norway.  He started her on Carafate. She was advised to call immediately if she has any concerning symptoms in the interval. The patient voices understanding of current disease status and treatment options and is in agreement with the current care plan. All questions were answered. The patient knows to call the clinic with any problems, questions or concerns. We can certainly see the patient much sooner if necessary.   Disclaimer: This note was dictated with voice recognition software. Similar sounding words can inadvertently be transcribed and may not be corrected upon review.

## 2019-07-03 NOTE — Patient Instructions (Addendum)
[facility] °Discharge Instructions for Patients Receiving Chemotherapy ° °Today you received the following chemotherapy agents Gemzar ° °To help prevent nausea and vomiting after your treatment, we encourage you to take your nausea medication as directed. °  °If you develop nausea and vomiting that is not controlled by your nausea medication, call the clinic.  ° °BELOW ARE SYMPTOMS THAT SHOULD BE REPORTED IMMEDIATELY: °· *FEVER GREATER THAN 100.5 F °· *CHILLS WITH OR WITHOUT FEVER °· NAUSEA AND VOMITING THAT IS NOT CONTROLLED WITH YOUR NAUSEA MEDICATION °· *UNUSUAL SHORTNESS OF BREATH °· *UNUSUAL BRUISING OR BLEEDING °· TENDERNESS IN MOUTH AND THROAT WITH OR WITHOUT PRESENCE OF ULCERS °· *URINARY PROBLEMS °· *BOWEL PROBLEMS °· UNUSUAL RASH °Items with * indicate a potential emergency and should be followed up as soon as possible. ° °Feel free to call the clinic should you have any questions or concerns. The clinic phone number is [phone]. ° °Please show the CHEMO ALERT CARD at check-in to the Emergency Department and triage nurse. ° ° °

## 2019-07-04 ENCOUNTER — Telehealth: Payer: Self-pay | Admitting: Internal Medicine

## 2019-07-04 NOTE — Telephone Encounter (Signed)
Scheduled appt per 10/29 los - pt to get an updated schedule next visit.

## 2019-07-10 ENCOUNTER — Inpatient Hospital Stay: Payer: Medicare Other | Attending: Internal Medicine

## 2019-07-10 ENCOUNTER — Other Ambulatory Visit: Payer: Self-pay

## 2019-07-10 ENCOUNTER — Inpatient Hospital Stay: Payer: Medicare Other

## 2019-07-10 VITALS — BP 126/76 | HR 79 | Temp 98.1°F | Resp 20

## 2019-07-10 DIAGNOSIS — Z7951 Long term (current) use of inhaled steroids: Secondary | ICD-10-CM | POA: Diagnosis not present

## 2019-07-10 DIAGNOSIS — I10 Essential (primary) hypertension: Secondary | ICD-10-CM | POA: Insufficient documentation

## 2019-07-10 DIAGNOSIS — J45909 Unspecified asthma, uncomplicated: Secondary | ICD-10-CM | POA: Diagnosis not present

## 2019-07-10 DIAGNOSIS — I4891 Unspecified atrial fibrillation: Secondary | ICD-10-CM | POA: Diagnosis not present

## 2019-07-10 DIAGNOSIS — Z79899 Other long term (current) drug therapy: Secondary | ICD-10-CM | POA: Diagnosis not present

## 2019-07-10 DIAGNOSIS — Z5111 Encounter for antineoplastic chemotherapy: Secondary | ICD-10-CM | POA: Insufficient documentation

## 2019-07-10 DIAGNOSIS — E785 Hyperlipidemia, unspecified: Secondary | ICD-10-CM | POA: Insufficient documentation

## 2019-07-10 DIAGNOSIS — E860 Dehydration: Secondary | ICD-10-CM

## 2019-07-10 DIAGNOSIS — C342 Malignant neoplasm of middle lobe, bronchus or lung: Secondary | ICD-10-CM | POA: Diagnosis not present

## 2019-07-10 DIAGNOSIS — C3490 Malignant neoplasm of unspecified part of unspecified bronchus or lung: Secondary | ICD-10-CM

## 2019-07-10 DIAGNOSIS — G629 Polyneuropathy, unspecified: Secondary | ICD-10-CM | POA: Diagnosis not present

## 2019-07-10 DIAGNOSIS — Z923 Personal history of irradiation: Secondary | ICD-10-CM | POA: Insufficient documentation

## 2019-07-10 DIAGNOSIS — E039 Hypothyroidism, unspecified: Secondary | ICD-10-CM | POA: Diagnosis not present

## 2019-07-10 LAB — CBC WITH DIFFERENTIAL (CANCER CENTER ONLY)
Abs Immature Granulocytes: 0.04 10*3/uL (ref 0.00–0.07)
Basophils Absolute: 0.1 10*3/uL (ref 0.0–0.1)
Basophils Relative: 1 %
Eosinophils Absolute: 0.1 10*3/uL (ref 0.0–0.5)
Eosinophils Relative: 1 %
HCT: 27.5 % — ABNORMAL LOW (ref 36.0–46.0)
Hemoglobin: 9.1 g/dL — ABNORMAL LOW (ref 12.0–15.0)
Immature Granulocytes: 1 %
Lymphocytes Relative: 16 %
Lymphs Abs: 1.2 10*3/uL (ref 0.7–4.0)
MCH: 31.5 pg (ref 26.0–34.0)
MCHC: 33.1 g/dL (ref 30.0–36.0)
MCV: 95.2 fL (ref 80.0–100.0)
Monocytes Absolute: 0.7 10*3/uL (ref 0.1–1.0)
Monocytes Relative: 9 %
Neutro Abs: 5.1 10*3/uL (ref 1.7–7.7)
Neutrophils Relative %: 72 %
Platelet Count: 473 10*3/uL — ABNORMAL HIGH (ref 150–400)
RBC: 2.89 MIL/uL — ABNORMAL LOW (ref 3.87–5.11)
RDW: 15.9 % — ABNORMAL HIGH (ref 11.5–15.5)
WBC Count: 7.1 10*3/uL (ref 4.0–10.5)
nRBC: 0 % (ref 0.0–0.2)

## 2019-07-10 LAB — CMP (CANCER CENTER ONLY)
ALT: 9 U/L (ref 0–44)
AST: 14 U/L — ABNORMAL LOW (ref 15–41)
Albumin: 3.2 g/dL — ABNORMAL LOW (ref 3.5–5.0)
Alkaline Phosphatase: 117 U/L (ref 38–126)
Anion gap: 13 (ref 5–15)
BUN: 20 mg/dL (ref 8–23)
CO2: 28 mmol/L (ref 22–32)
Calcium: 9.6 mg/dL (ref 8.9–10.3)
Chloride: 96 mmol/L — ABNORMAL LOW (ref 98–111)
Creatinine: 0.98 mg/dL (ref 0.44–1.00)
GFR, Est AFR Am: >60 mL/min (ref >60)
GFR, Estimated: 56 mL/min — ABNORMAL LOW (ref >60)
Glucose, Bld: 95 mg/dL (ref 70–99)
Potassium: 3.5 mmol/L (ref 3.5–5.1)
Sodium: 137 mmol/L (ref 135–145)
Total Bilirubin: 0.3 mg/dL (ref 0.3–1.2)
Total Protein: 7.1 g/dL (ref 6.5–8.1)

## 2019-07-10 MED ORDER — HEPARIN SOD (PORK) LOCK FLUSH 100 UNIT/ML IV SOLN
500.0000 [IU] | Freq: Once | INTRAVENOUS | Status: AC | PRN
  Administered 2019-07-10: 500 [IU]
  Filled 2019-07-10: qty 5

## 2019-07-10 MED ORDER — SODIUM CHLORIDE 0.9 % IV SOLN
1000.0000 mg/m2 | Freq: Once | INTRAVENOUS | Status: AC
  Administered 2019-07-10: 1634 mg via INTRAVENOUS
  Filled 2019-07-10: qty 42.98

## 2019-07-10 MED ORDER — PROCHLORPERAZINE MALEATE 10 MG PO TABS
10.0000 mg | ORAL_TABLET | Freq: Once | ORAL | Status: AC
  Administered 2019-07-10: 10 mg via ORAL

## 2019-07-10 MED ORDER — SODIUM CHLORIDE 0.9% FLUSH
10.0000 mL | INTRAVENOUS | Status: DC | PRN
  Administered 2019-07-10: 16:00:00 10 mL
  Filled 2019-07-10: qty 10

## 2019-07-10 MED ORDER — PROCHLORPERAZINE MALEATE 10 MG PO TABS
ORAL_TABLET | ORAL | Status: AC
  Filled 2019-07-10: qty 1

## 2019-07-10 MED ORDER — SODIUM CHLORIDE 0.9 % IV SOLN
Freq: Once | INTRAVENOUS | Status: AC
  Administered 2019-07-10: 15:00:00 via INTRAVENOUS
  Filled 2019-07-10: qty 250

## 2019-07-10 MED ORDER — SODIUM CHLORIDE 0.9% FLUSH
10.0000 mL | INTRAVENOUS | Status: DC | PRN
  Administered 2019-07-10: 14:00:00 10 mL
  Filled 2019-07-10: qty 10

## 2019-07-10 NOTE — Patient Instructions (Signed)
[facility] Discharge Instructions for Patients Receiving Chemotherapy  Today you received the following chemotherapy agents: gemzar.  To help prevent nausea and vomiting after your treatment, we encourage you to take your nausea medication as prescribed.   If you develop nausea and vomiting that is not controlled by your nausea medication, call the clinic.   BELOW ARE SYMPTOMS THAT SHOULD BE REPORTED IMMEDIATELY:  *FEVER GREATER THAN 100.5 F  *CHILLS WITH OR WITHOUT FEVER  NAUSEA AND VOMITING THAT IS NOT CONTROLLED WITH YOUR NAUSEA MEDICATION  *UNUSUAL SHORTNESS OF BREATH  *UNUSUAL BRUISING OR BLEEDING  TENDERNESS IN MOUTH AND THROAT WITH OR WITHOUT PRESENCE OF ULCERS  *URINARY PROBLEMS  *BOWEL PROBLEMS  UNUSUAL RASH Items with * indicate a potential emergency and should be followed up as soon as possible.  Feel free to call the clinic should you have any questions or concerns. The clinic phone number is [phone].  Please show the [facility] at check-in to the Emergency Department and triage nurse.

## 2019-07-10 NOTE — Patient Instructions (Addendum)

## 2019-07-16 ENCOUNTER — Ambulatory Visit (INDEPENDENT_AMBULATORY_CARE_PROVIDER_SITE_OTHER): Payer: Medicare Other | Admitting: Internal Medicine

## 2019-07-16 ENCOUNTER — Encounter: Payer: Self-pay | Admitting: Internal Medicine

## 2019-07-16 ENCOUNTER — Other Ambulatory Visit: Payer: Self-pay

## 2019-07-16 VITALS — BP 142/77 | HR 80 | Ht 59.0 in | Wt 131.6 lb

## 2019-07-16 DIAGNOSIS — I480 Paroxysmal atrial fibrillation: Secondary | ICD-10-CM

## 2019-07-16 DIAGNOSIS — C342 Malignant neoplasm of middle lobe, bronchus or lung: Secondary | ICD-10-CM | POA: Diagnosis not present

## 2019-07-16 NOTE — Progress Notes (Signed)
OFFICE NOTE  Chief Complaint:  Follow-up, ?afib  Primary Care Physician: Deland Pretty, MD  HPI:  Kimberly [name] is a 78 year old female who recently has been having some chest discomfort and shortness of breath as well as 2 episodes of syncope, both of which were during stressful situations, once while on a ladder. We went ahead and checked an echocardiogram which essentially shows only mild abnormalities. She did have a loudly calcified aortic valve which was not stenotic; however, there was mild regurgitation. There was mild LVH with EF 55% to 60% and grade 1 diastolic dysfunction. She had borderline pulmonary hypertension. A monitor was also worn by her from September 13, 2012 to October 12, 2012. This showed a predominantly sinus rhythm with PACs. There was, however, 1 episode of a nonsustained ventricular tachycardia which was about 5 beats. Of course she potentially would have to had longer episodes of NSVT this could have been playing a role in her syncopal event. I do not think she, however, is at high risk for lethal sustained VT given her normal ejection fraction and no clear evidence of ischemia. As you recall, she had a low-risk MET-test on April 02, 2012, with a peak VO2 of 77% predicted without any evidence of an ischemic response. This would make it quite unlikely that she has underlying coronary blockage that is responsible for her episode. I have, however, recommended adding a beta blocker to her regimen which will additionally help control her recent uncontrolled hypertension. This is Toprol XL 12.5 mg daily. It should help also with the VT. she is continuing to take low-dose Toprol and has had no further episodes of passing out in the past 6 months. She was thoroughly evaluated by a neurologist and felt to possibly be having a vasodepressive syncope. There was lower extremity.swelling and compression stockings were recommended.  Kimberly [name] returns today for followup. She has been  hospitalized a number of times since getting a diagnosis of lung cancer. 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 Unfortunately she developed A. fib with RVR and was treated and placed on amiodarone. She's been having problems with recurrent vasovagal syncope and hypotension. She does have a mass that is pressing against the atrium.  I saw Kimberly [name] back in the office today. She is undergoing chemotherapy which is cause some mild shrinkage of her tumor however it is not disappeared, and apparently is incurable and unresectable. Fortunate she's had no recurrence of her A. fib and remains on low-dose amiodarone. She says that she feels better than she had when she was undergoing radiation chemotherapy. She has no new complaints.  Kimberly [name] returns today for follow-up. She seems to be holding her own with regards for lung cancer. She has another 4 month reprieve to see her oncologist. Her most recent scan showed a slight increase in the size of her cancer with another small tumor but she seems to be taking this pretty well. She's not had recurrent A. fib from what I can tell and maintains on low-dose amiodarone. She is due for repeat lipid testing as well as surveillance thyroid testing.   Kimberly [name] returns today for follow-up. She denies any chest pain although does get short of breath with some activities. She reports that her lung cancer is still growing at a slow rate however  She was intolerant of extensive chemotherapy and is now on a chemotherapy break. Recently I received notification from her optometrist that she  has some amiodarone deposits in her eyes and therefore he recommended discontinuing her medication if it was feasible. Since she's not had A. Fib in some time I thought it would be reasonable to take her off the medicine to see if there would be any recurrence. She's had some vision loss unrelated to amiodarone and we did not like to see that adding to  it.  05/03/2016  Kimberly [name] returns today for follow-up. She says that recently she's continued to be fatigued. She does not relate this to her recent lung cancer and chemotherapy. She stopped taking atorvastatin due to muscle aches and reports some improvement and no symptoms. She is concerned about cardiac causes of her symptoms including coronary artery disease. Blood pressure appears well controlled today. EKG shows a sinus rhythm with poor anterior R-wave progression and 82.  06/05/2016  Kimberly [name] was seen today in follow-up. She underwent nuclear stress testing which was negative for ischemia and showed normal LV function. I reassured her that I did not feel that her fatigue is related to worsening coronary artery disease.  01/01/2017  Kimberly [name] returns today for follow-up. Unfortunately she's had some recurrent cancer. She underwent another round of chemotherapy. After this chemotherapy she was noted to be more short of breath and fatigues much easier. She's also had some worsening dependent edema. She says the swelling typically goes down in the morning and is swollen by the end of the day when she's been up on her feet. She recently had a stress test which was negative for ischemia and showed normal LV function.  01/02/2018  Kimberly [name] was seen today in follow-up.  She continues to battle lung cancer.  She reports that she has been more short of breath recently.  The shortness of breath is associated with cough as well.  The cough is been somewhat significant.  She gets into fits of cough, particularly at night for which she feels like her heart might stop.  She wondered if it could be related to her medications.  She is on an ARB but not an ACE inhibitor.  There is a small likelihood that could be playing a role in her cough, however I suspect it may be lung parenchymal disease or related to her lung cancer.  She does not see a pulmonologist.  Labs as of October 2018 showed total cholesterol 162, HDL 49, LDL 78  and triglycerides 174.  07/09/2018  Kimberly [name] is seen today in follow-up.  She has been followed by Dr. Lake Bells and saw him yesterday.  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  This is dry but somewhat irritating for her.  She is on Opdivo.  She is scheduled for repeat CT scan tomorrow and follow-up with Dr. Earlie Server in 2 days.  She denies any recurrent A. fib or chest pain.  She does struggle with shortness of breath with exertion.  07/16/2019  Kimberly [name] is seen today in follow-up.  Unfortunately her cancer seems to be progressing.  She had no significant benefit from Coffee, and she says that her tumor recently grew about 43%.  She is now on oxygen.  There are plans to try additional chemotherapy however she is not optimistic about her options.  She may consider palliative care soon.  She reports that during her chemo visits she was told she was having afib. This was by auscultation noting an irregular rhythm, however, EKG today shows sinus with PAC's. The diagnosis of afib cannot  be made with auscultation alone.  PMHx:  Past Medical History:  Diagnosis Date  . Allergic asthma without acute exacerbation or status asthmaticus   . Aortic insufficiency   . Arthritis   . Atrial fibrillation ([place])   . Back pain 08/14/2016  . Bronchitis   . Cough    dry, endobronchial mass  . Dyslipidemia   . GERD (gastroesophageal reflux disease)   . Heart murmur   . History of blood transfusion   . History of bronchitis   . History of chemotherapy   . History of nuclear stress test 02/26/2006   exercise myoview; normal pattern of perfusion; low risk scan   . Hypertension   . Hypothyroidism   . Mitral insufficiency   . Pneumonia   . PONV (postoperative nausea and vomiting)   . Radiation 12/01/13-01/08/14   50.4 gray to right central chest  . Renal mass, left 04/12/2017  . Shortness of breath    with exertion  . 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 Syncope    "states she has passed out a few times. dr is trying to find cause.last time when geeting ready to go home after video bronch/bx    Past Surgical History:  Procedure Laterality Date  . CARDIAC CATHETERIZATION  07/08/2008   normal coronaries  . CARPAL TUNNEL RELEASE Right 1988  . COLONOSCOPY N/A 02/11/2016   Procedure: COLONOSCOPY;  Surgeon: Carol Ada, MD;  Location: [facility];  Service: Endoscopy;  Laterality: N/A;  . DILATION AND CURETTAGE OF UTERUS    . ESOPHAGOGASTRODUODENOSCOPY N/A 02/08/2017   Procedure: ESOPHAGOGASTRODUODENOSCOPY (EGD);  Surgeon: Carol Ada, MD;  Location: [facility];  Service: Endoscopy;  Laterality: N/A;  . ESOPHAGOGASTRODUODENOSCOPY (EGD) WITH PROPOFOL N/A 03/21/2019   Procedure: ESOPHAGOGASTRODUODENOSCOPY (EGD) WITH PROPOFOL;  Surgeon: Carol Ada, MD;  Location: [facility];  Service: Endoscopy;  Laterality: N/A;  . EYE SURGERY Bilateral   . H/O MET Test w/PFT  04/02/2012   low risk; peak VO2 77% predicted  . IR GENERIC HISTORICAL  09/12/2016   IR FLUORO GUIDE PORT INSERTION RIGHT 09/12/2016 [name], MD [facility]  . IR GENERIC HISTORICAL  09/12/2016   IR US GUIDE VASC ACCESS RIGHT 09/12/2016 [name], MD [facility]  . JOINT REPLACEMENT  2003   thumb rt  . KNEE ARTHROSCOPY  12   rt meniscus  . MEDIASTINOSCOPY N/A 10/30/2013   Procedure: MEDIASTINOSCOPY;  Surgeon: Melrose Nakayama, MD;  Location: [facility];  Service: Thoracic;  Laterality: N/A;  . ROTATOR CUFF REPAIR  2006   ? side  . THYROIDECTOMY  1973  . TRANSTHORACIC ECHOCARDIOGRAM  09/25/2012   EF 55-60%; mild LVH & mild concentric hypertrophy; mild AV regurg; RV systolic pressure increase consistent with mild pulm HTN  . VIDEO ASSISTED THORACOSCOPY (VATS)/ LOBECTOMY Right 11/13/2013   Procedure: VIDEO ASSISTED THORACOSCOPY (VATS) with mediastinal  biopsies;  Surgeon: Melrose Nakayama, MD;  Location: [facility];  Service: Thoracic;   Laterality: Right;  RIGHT VATS,mediastinal biopsies  . VIDEO BRONCHOSCOPY Bilateral 09/25/2013   Procedure: VIDEO BRONCHOSCOPY WITHOUT FLUORO;  Surgeon: Tanda Rockers, MD;  Location: [facility];  Service: Cardiopulmonary;  Laterality: Bilateral;  . VIDEO BRONCHOSCOPY WITH ENDOBRONCHIAL ULTRASOUND N/A 10/30/2013   Procedure: VIDEO BRONCHOSCOPY WITH ENDOBRONCHIAL ULTRASOUND;  Surgeon: Melrose Nakayama, MD;  Location: [facility];  Service: Thoracic;  Laterality: N/A;    FAMHx:  Family History  Problem Relation Age of Onset  . Lung cancer Mother   . Heart attack  Father   . Heart failure Maternal Grandfather   . Heart attack Paternal Grandfather     SOCHx:   reports that she quit smoking about 51 years ago. Her smoking use included cigarettes. She has a 3.00 pack-year smoking history. She has never used smokeless tobacco. She reports current alcohol use. She reports that she does not use drugs.  ALLERGIES:  Allergies  Allergen Reactions  . Lactose Intolerance (Gi) Other (See Comments)  . Amiodarone Other (See Comments)    Corneal deposits  . Augmentin [Amoxicillin-Pot Clavulanate] Diarrhea    *ended up with cdiff* Has patient had a PCN reaction causing immediate rash, facial/tongue/throat swelling, SOB or lightheadedness with hypotension: No Has patient had a PCN reaction causing severe rash involving mucus membranes or skin necrosis: No Has patient had a PCN reaction that required hospitalization: Yes Has patient had a PCN reaction occurring within the last 10 years: Yes If all of the above answers are "NO", then may proceed with Cephalosporin use.   . Milk-Related Compounds Other (See Comments)    GI upset, projectile vomiting - can't take any dairy products  . Oxycontin  [Oxycodone Hcl] Other (See Comments)    ROS: Pertinent items noted in HPI and remainder of comprehensive ROS otherwise negative.  HOME MEDS: Current Outpatient Medications  Medication Sig Dispense Refill  .  albuterol (VENTOLIN HFA) 108 (90 Base) MCG/ACT inhaler Inhale 1 puff into the lungs every 6 (six) hours as needed (before walking or heavy activity [try to limit to twice daily]). 1 Inhaler 5  . amLODipine (NORVASC) 5 MG tablet TAKE 1 TABLET BY MOUTH EVERY DAY 90 tablet 0  . atorvastatin (LIPITOR) 20 MG tablet Take 20 mg by mouth daily.     . chlorthalidone (HYGROTON) 25 MG tablet Take 25 mg by mouth daily.    . fluticasone (FLONASE) 50 MCG/ACT nasal spray Place 2 sprays into both nostrils daily as needed (allergies.).     [name] levothyroxine (SYNTHROID, LEVOTHROID) 100 MCG tablet Take 100 mcg by mouth every evening.     . lidocaine-prilocaine (EMLA) cream Apply 1 application topically as needed. (Patient taking differently: Apply 1 application topically as needed (for port access). ) 30 g 0  . metoprolol succinate (TOPROL-XL) 25 MG 24 hr tablet Take 1 tablet (25 mg total) by mouth daily. 90 tablet 3  . Mometasone Furoate (ASMANEX HFA) 100 MCG/ACT AERO Inhale 2 puffs into the lungs 2 (two) times daily. 39 g 3  . montelukast (SINGULAIR) 10 MG tablet Take 10 mg by mouth at bedtime.     . Multiple Vitamins-Iron (MULTIVITAMIN/IRON PO) Take 1 tablet by mouth daily with breakfast.     . pantoprazole (PROTONIX) 40 MG tablet TAKE 1 TABLET BY MOUTH  TWICE DAILY 120 tablet 3  . potassium chloride (K-DUR) 10 MEQ tablet Take 2 tablets (20 mEq total) by mouth 2 (two) times daily for 10 days. 40 tablet 0  . prochlorperazine (COMPAZINE) 10 MG tablet Take 1 tablet (10 mg total) by mouth every 6 (six) hours as needed for nausea or vomiting. 30 tablet 0  . traMADol (ULTRAM) 50 MG tablet Take 25-50 mg by mouth every 8 (eight) hours as needed (severe pain.).   3   No current facility-administered medications for this visit.    Facility-Administered Medications Ordered in Other Visits  Medication Dose Route Frequency Provider Last Rate Last Dose  . sodium chloride flush (NS) 0.9 % injection 10 mL  10 mL Intracatheter PRN  Curt Bears, MD  10 mL at 07/10/19 1602    LABS/IMAGING: No results found for this or any previous visit (from the past 48 hour(s)). No results found.  VITALS: BP (!) 142/77   Pulse 80   Ht _0  (1.499 m)   Wt 131 lb 9.6 oz (59.7 kg)   SpO2 95%   BMI 26.58 kg/m   EXAM: General appearance: alert and mild distress Neck: no carotid bruit, no JVD and thyroid not enlarged, symmetric, no tenderness/mass/nodules Lungs: diminished breath sounds bilaterally Heart: regular rate and rhythm Abdomen: soft, non-tender; bowel sounds normal; no masses,  no organomegaly Extremities: extremities normal, atraumatic, no cyanosis or edema Pulses: 2+ and symmetric Skin: Pale, warm, dry Neurologic: Grossly normal Psych: Pleasant  EKG: Sinus rhythm with PACs at 80, low voltage QRS-personally reviewed  ASSESSMENT: 1. Progressive fatigue and dyspnea - low risk myoview stress test (EF 60%) - 05/2016 2. Persistent cough, mostly at night 3. Lung cancer (non-small cell stage III) 4. Recurrent vasodepressor syncope  5. Lower extremity edema  6. Hypertension 7. Paroxysmal atrial fibrillation - no recurrence, brief and related to para-atrial tumor  PLAN: 1.   Mrs. Mckenna recently was noted to have an irregular rhythm on auscultation with a concern for possible recurrent A. fib.  EKG shows sinus with PACs today.  I suspect may be she was having frequent PACs but cannot say for sure.  I would like to place a 2-week monitor to see if she is have any recurrent A. fib.  If there is a significant burden, will need to discuss whether anticoagulation is warranted.  Follow-up with me in 3 months or sooner as necessary.  Pixie Casino, MD, [facility], [place] Director of the Advanced Lipid Disorders &  Cardiovascular Risk Reduction Clinic Diplomate of the American Board of Clinical Lipidology Attending Cardiologist  Direct Dial: [phone]  Fax: [phone]   Website:  [email or web address]   Nadean Corwin Kemaria Dedic 07/16/2019, 5:22 PM

## 2019-07-16 NOTE — Patient Instructions (Signed)
Medication Instructions:  Your physician recommends that you continue on your current medications as directed. Please refer to the Current Medication list given to you today.  *If you need a refill on your cardiac medications before your next appointment, please call your pharmacy*  Lab Work: NONE If you have labs (blood work) drawn today and your tests are completely normal, you will receive your results only by: [name] MyChart Message (if you have MyChart) OR . A paper copy in the mail If you have any lab test that is abnormal or we need to change your treatment, we will call you to review the results.  Testing/Procedures: Your provider has ordered a ZIO monitor. You will wear this for 14 days.   1. Avoid showering during the first 24 hours of wearing the monitor. 2. Avoid excessive sweating to help maximize wear time. 3. Do not submerge the device, no hot tubs, and no swimming pools. 4. Keep any lotions or oils away from the patch. 5. After 24 hours you may shower with the patch on. Take brief showers with your back facing the shower head.  6. Do not remove patch once it has been placed because that will interrupt data and decrease adhesive wear time. 7. Push the button when you have any symptoms and write down what you were feeling. 8. Once you have completed wearing your monitor, remove and place into box which has postage paid and place in your outgoing mailbox.  9. If for some reason you have misplaced your box then call our office and we can provide another box and/or mail it off for you.   Follow-Up: At [facility], you and your health needs are our priority.  As part of our continuing mission to provide you with exceptional heart care, we have created designated Provider Care Teams.  These Care Teams include your primary Cardiologist (physician) and Advanced Practice Providers (APPs -  Physician Assistants and Nurse Practitioners) who all work together to provide you with the care  you need, when you need it.  Your next appointment:   6-8 weeks  The format for your next appointment:   In Person  Provider:   You may see Dr. Debara Pickett or one of the following Advanced Practice Providers on your designated Care Team:    Almyra Deforest, PA-C  Fabian Sharp, [place] or   Roby Lofts, [place]   Other Instructions

## 2019-07-18 DIAGNOSIS — J9611 Chronic respiratory failure with hypoxia: Secondary | ICD-10-CM | POA: Diagnosis not present

## 2019-07-18 DIAGNOSIS — C3490 Malignant neoplasm of unspecified part of unspecified bronchus or lung: Secondary | ICD-10-CM | POA: Diagnosis not present

## 2019-07-22 ENCOUNTER — Other Ambulatory Visit: Payer: Self-pay

## 2019-07-22 ENCOUNTER — Encounter (HOSPITAL_COMMUNITY): Payer: Self-pay

## 2019-07-22 ENCOUNTER — Ambulatory Visit (HOSPITAL_COMMUNITY)
Admission: RE | Admit: 2019-07-22 | Discharge: 2019-07-22 | Disposition: A | Discharge status: Home / Self Care | Payer: Medicare Other | Source: Ambulatory Visit | Attending: Internal Medicine | Admitting: Internal Medicine

## 2019-07-22 DIAGNOSIS — N2889 Other specified disorders of kidney and ureter: Secondary | ICD-10-CM | POA: Diagnosis not present

## 2019-07-22 DIAGNOSIS — C3490 Malignant neoplasm of unspecified part of unspecified bronchus or lung: Secondary | ICD-10-CM | POA: Insufficient documentation

## 2019-07-22 MED ORDER — SODIUM CHLORIDE (PF) 0.9 % IJ SOLN
INTRAMUSCULAR | Status: AC
  Filled 2019-07-22: qty 50

## 2019-07-22 MED ORDER — IOHEXOL 300 MG/ML  SOLN
100.0000 mL | Freq: Once | INTRAMUSCULAR | Status: AC | PRN
  Administered 2019-07-22: 100 mL via INTRAVENOUS

## 2019-07-22 MED ORDER — HEPARIN SOD (PORK) LOCK FLUSH 100 UNIT/ML IV SOLN
INTRAVENOUS | Status: AC
  Filled 2019-07-22: qty 5

## 2019-07-22 MED ORDER — HEPARIN SOD (PORK) LOCK FLUSH 100 UNIT/ML IV SOLN
500.0000 [IU] | Freq: Once | INTRAVENOUS | Status: AC
  Administered 2019-07-22: 12:00:00 500 [IU] via INTRAVENOUS

## 2019-07-24 ENCOUNTER — Other Ambulatory Visit: Payer: Self-pay

## 2019-07-24 ENCOUNTER — Inpatient Hospital Stay: Payer: Medicare Other

## 2019-07-24 ENCOUNTER — Encounter: Payer: Self-pay | Admitting: Internal Medicine

## 2019-07-24 ENCOUNTER — Inpatient Hospital Stay: Payer: Medicare Other | Admitting: General Practice

## 2019-07-24 ENCOUNTER — Telehealth: Payer: Self-pay | Admitting: Internal Medicine

## 2019-07-24 ENCOUNTER — Inpatient Hospital Stay (HOSPITAL_BASED_OUTPATIENT_CLINIC_OR_DEPARTMENT_OTHER): Payer: Medicare Other | Admitting: Internal Medicine

## 2019-07-24 VITALS — BP 123/70 | HR 95 | Temp 98.5°F | Resp 17 | Ht 59.0 in | Wt 131.0 lb

## 2019-07-24 DIAGNOSIS — C3490 Malignant neoplasm of unspecified part of unspecified bronchus or lung: Secondary | ICD-10-CM

## 2019-07-24 DIAGNOSIS — R112 Nausea with vomiting, unspecified: Secondary | ICD-10-CM | POA: Diagnosis not present

## 2019-07-24 DIAGNOSIS — Z923 Personal history of irradiation: Secondary | ICD-10-CM | POA: Diagnosis not present

## 2019-07-24 DIAGNOSIS — E785 Hyperlipidemia, unspecified: Secondary | ICD-10-CM | POA: Diagnosis not present

## 2019-07-24 DIAGNOSIS — Z79899 Other long term (current) drug therapy: Secondary | ICD-10-CM | POA: Diagnosis not present

## 2019-07-24 DIAGNOSIS — Z5111 Encounter for antineoplastic chemotherapy: Secondary | ICD-10-CM

## 2019-07-24 DIAGNOSIS — C342 Malignant neoplasm of middle lobe, bronchus or lung: Secondary | ICD-10-CM | POA: Diagnosis not present

## 2019-07-24 DIAGNOSIS — I10 Essential (primary) hypertension: Secondary | ICD-10-CM

## 2019-07-24 DIAGNOSIS — G629 Polyneuropathy, unspecified: Secondary | ICD-10-CM | POA: Diagnosis not present

## 2019-07-24 DIAGNOSIS — J45909 Unspecified asthma, uncomplicated: Secondary | ICD-10-CM | POA: Diagnosis not present

## 2019-07-24 DIAGNOSIS — Z7189 Other specified counseling: Secondary | ICD-10-CM

## 2019-07-24 DIAGNOSIS — E039 Hypothyroidism, unspecified: Secondary | ICD-10-CM | POA: Diagnosis not present

## 2019-07-24 DIAGNOSIS — Z95828 Presence of other vascular implants and grafts: Secondary | ICD-10-CM

## 2019-07-24 DIAGNOSIS — Z7951 Long term (current) use of inhaled steroids: Secondary | ICD-10-CM | POA: Diagnosis not present

## 2019-07-24 DIAGNOSIS — I4891 Unspecified atrial fibrillation: Secondary | ICD-10-CM | POA: Diagnosis not present

## 2019-07-24 DIAGNOSIS — D649 Anemia, unspecified: Secondary | ICD-10-CM

## 2019-07-24 LAB — CMP (CANCER CENTER ONLY)
ALT: 11 U/L (ref 0–44)
AST: 11 U/L — ABNORMAL LOW (ref 15–41)
Albumin: 3.2 g/dL — ABNORMAL LOW (ref 3.5–5.0)
Alkaline Phosphatase: 131 U/L — ABNORMAL HIGH (ref 38–126)
Anion gap: 12 (ref 5–15)
BUN: 20 mg/dL (ref 8–23)
CO2: 30 mmol/L (ref 22–32)
Calcium: 9.3 mg/dL (ref 8.9–10.3)
Chloride: 95 mmol/L — ABNORMAL LOW (ref 98–111)
Creatinine: 0.94 mg/dL (ref 0.44–1.00)
GFR, Est AFR Am: >60 mL/min (ref >60)
GFR, Estimated: 59 mL/min — ABNORMAL LOW (ref >60)
Glucose, Bld: 120 mg/dL — ABNORMAL HIGH (ref 70–99)
Potassium: 3.3 mmol/L — ABNORMAL LOW (ref 3.5–5.1)
Sodium: 137 mmol/L (ref 135–145)
Total Bilirubin: 0.3 mg/dL (ref 0.3–1.2)
Total Protein: 6.9 g/dL (ref 6.5–8.1)

## 2019-07-24 LAB — CBC WITH DIFFERENTIAL (CANCER CENTER ONLY)
Abs Immature Granulocytes: 0.08 10*3/uL — ABNORMAL HIGH (ref 0.00–0.07)
Basophils Absolute: 0.1 10*3/uL (ref 0.0–0.1)
Basophils Relative: 1 %
Eosinophils Absolute: 0.1 10*3/uL (ref 0.0–0.5)
Eosinophils Relative: 1 %
HCT: 27.4 % — ABNORMAL LOW (ref 36.0–46.0)
Hemoglobin: 9 g/dL — ABNORMAL LOW (ref 12.0–15.0)
Immature Granulocytes: 1 %
Lymphocytes Relative: 10 %
Lymphs Abs: 1.1 10*3/uL (ref 0.7–4.0)
MCH: 31.8 pg (ref 26.0–34.0)
MCHC: 32.8 g/dL (ref 30.0–36.0)
MCV: 96.8 fL (ref 80.0–100.0)
Monocytes Absolute: 1 10*3/uL (ref 0.1–1.0)
Monocytes Relative: 9 %
Neutro Abs: 8.8 10*3/uL — ABNORMAL HIGH (ref 1.7–7.7)
Neutrophils Relative %: 78 %
Platelet Count: 502 10*3/uL — ABNORMAL HIGH (ref 150–400)
RBC: 2.83 MIL/uL — ABNORMAL LOW (ref 3.87–5.11)
RDW: 16.9 % — ABNORMAL HIGH (ref 11.5–15.5)
WBC Count: 11.2 10*3/uL — ABNORMAL HIGH (ref 4.0–10.5)
nRBC: 0 % (ref 0.0–0.2)

## 2019-07-24 MED ORDER — SODIUM CHLORIDE 0.9% FLUSH
10.0000 mL | INTRAVENOUS | Status: DC | PRN
  Administered 2019-07-24: 12:00:00 10 mL via INTRAVENOUS
  Filled 2019-07-24: qty 10

## 2019-07-24 NOTE — Progress Notes (Signed)
[facility] [phone]   Fax:([phone]) Graball, MD [address] [place] [address]  DIAGNOSIS: Recurrent non-small cell lung cancer, squamous cell carcinoma initially diagnosed as unresectable a stage IIIa in February 2015.  PRIOR THERAPY: 1) Status post exploratory right VATS with mediastinal biopsy under the care of Dr. Roxan Hockey on 11/13/2013. The tumor was found to be unresectable at that time.[name] 2) Concurrent chemoradiation with weekly carboplatin for AUC of 2 and paclitaxel 45 mg/M2, status post 6 cycles with partial response. First cycle was given on 12/01/2013. 3) Consolidation chemotherapy with carboplatin for AUC of 5 and paclitaxel 175 mg/M2 every 3 weeks with Neulasta support. First dose 02/23/2014. Status post 3 cycles with partial response. 4) Systemic chemotherapy with carboplatin for AUC of 5 and paclitaxel 175 MG/M2 every 3 weeks with Neulasta support. Status post 6 cycles. First dose was given on 08/14/2016.  Last dose was giving 12/11/2016 with partial response. 5)Second line treatment with immunotherapy with Nivolumab 480 mg IV every 4 weeks, first dose Jan 25, 2018. Status post 17cycles  CURRENT THERAPY: Systemic chemotherapy with gemcitabine 1000 mg/m on days 1 and 8 every 3 weeks. First dose on 05/22/2019.Status post 3 cycles.   Last dose was given July 10, 2019.  Discontinued secondary to disease progression.  INTERVAL HISTORY: Kimberly [name] 78 y.o. female returns to the clinic today for follow-up visit.  The patient is feeling fine today with no concerning complaints except for intermittent nausea.  She is currently on several antiemetics with mild improvement of her symptoms.  She was seen by her gastroenterologist Dr. Benson Norway and currently on treatment with Carafate too.  She denied having any current chest pain but has the baseline shortness of breath and she is on home  oxygen with no cough or hemoptysis.  She denied having any recent fever or chills.  She has no nausea, vomiting, diarrhea or constipation.  She has no headache or visual changes.  She has been tolerating her treatment with gemcitabine fairly well.  She had repeat CT scan of the chest, abdomen pelvis performed recently and she is here for evaluation and discussion of her scan results.  Her daughter Kimberly [name] was available by phone during the visit.  MEDICAL HISTORY: Past Medical History:  Diagnosis Date  . Allergic asthma without acute exacerbation or status asthmaticus   . Aortic insufficiency   . Arthritis   . Atrial fibrillation ([place])   . Back pain 08/14/2016  . Bronchitis   . Cough    dry, endobronchial mass  . Dyslipidemia   . GERD (gastroesophageal reflux disease)   . Heart murmur   . History of blood transfusion   . History of bronchitis   . History of chemotherapy   . History of nuclear stress test 02/26/2006   exercise myoview; normal pattern of perfusion; low risk scan   . Hypertension   . Hypothyroidism   . Mitral insufficiency   . Pneumonia   . PONV (postoperative nausea and vomiting)   . Radiation 12/01/13-01/08/14   50.4 gray to right central chest  . Renal mass, left 04/12/2017  . Shortness of breath    with exertion  . Squamous cell carcinoma of lung ([place])   . Syncope    "states she has passed out a few times. dr is trying to find cause.last time when geeting ready to go home after video bronch/bx    ALLERGIES:  is allergic to lactose intolerance (gi); amiodarone; augmentin [amoxicillin-pot clavulanate]; milk-related compounds; and oxycontin  [oxycodone hcl].  MEDICATIONS:  Current Outpatient Medications  Medication Sig Dispense Refill  . albuterol (VENTOLIN HFA) 108 (90 Base) MCG/ACT inhaler Inhale 1 puff into the lungs every 6 (six) hours as needed (before walking or heavy activity [try to limit to twice daily]). 1 Inhaler 5  . amLODipine (NORVASC) 5 MG tablet TAKE  1 TABLET BY MOUTH EVERY DAY 90 tablet 0  . atorvastatin (LIPITOR) 20 MG tablet Take 20 mg by mouth daily.     . chlorthalidone (HYGROTON) 25 MG tablet Take 25 mg by mouth daily.    . fluticasone (FLONASE) 50 MCG/ACT nasal spray Place 2 sprays into both nostrils daily as needed (allergies.).     [name] levothyroxine (SYNTHROID, LEVOTHROID) 100 MCG tablet Take 100 mcg by mouth every evening.     . lidocaine-prilocaine (EMLA) cream Apply 1 application topically as needed. (Patient taking differently: Apply 1 application topically as needed (for port access). ) 30 g 0  . metoprolol succinate (TOPROL-XL) 25 MG 24 hr tablet Take 1 tablet (25 mg total) by mouth daily. 90 tablet 3  . Mometasone Furoate (ASMANEX HFA) 100 MCG/ACT AERO Inhale 2 puffs into the lungs 2 (two) times daily. 39 g 3  . montelukast (SINGULAIR) 10 MG tablet Take 10 mg by mouth at bedtime.     . Multiple Vitamins-Iron (MULTIVITAMIN/IRON PO) Take 1 tablet by mouth daily with breakfast.     . pantoprazole (PROTONIX) 40 MG tablet TAKE 1 TABLET BY MOUTH  TWICE DAILY 120 tablet 3  . prochlorperazine (COMPAZINE) 10 MG tablet Take 1 tablet (10 mg total) by mouth every 6 (six) hours as needed for nausea or vomiting. 30 tablet 0  . traMADol (ULTRAM) 50 MG tablet Take 25-50 mg by mouth every 8 (eight) hours as needed (severe pain.).   3  . potassium chloride (K-DUR) 10 MEQ tablet Take 2 tablets (20 mEq total) by mouth 2 (two) times daily for 10 days. 40 tablet 0  . potassium chloride (KLOR-CON) 8 MEQ tablet      No current facility-administered medications for this visit.    Facility-Administered Medications Ordered in Other Visits  Medication Dose Route Frequency Provider Last Rate Last Dose  . sodium chloride flush (NS) 0.9 % injection 10 mL  10 mL Intracatheter PRN Curt Bears, MD   10 mL at 07/10/19 1602  . sodium chloride flush (NS) 0.9 % injection 10 mL  10 mL Intravenous PRN Curt Bears, MD   10 mL at 07/24/19 1208    SURGICAL  HISTORY:  Past Surgical History:  Procedure Laterality Date  . CARDIAC CATHETERIZATION  07/08/2008   normal coronaries  . CARPAL TUNNEL RELEASE Right 1988  . COLONOSCOPY N/A 02/11/2016   Procedure: COLONOSCOPY;  Surgeon: Carol Ada, MD;  Location: [facility];  Service: Endoscopy;  Laterality: N/A;  . DILATION AND CURETTAGE OF UTERUS    . ESOPHAGOGASTRODUODENOSCOPY N/A 02/08/2017   Procedure: ESOPHAGOGASTRODUODENOSCOPY (EGD);  Surgeon: Carol Ada, MD;  Location: [facility];  Service: Endoscopy;  Laterality: N/A;  . ESOPHAGOGASTRODUODENOSCOPY (EGD) WITH PROPOFOL N/A 03/21/2019   Procedure: ESOPHAGOGASTRODUODENOSCOPY (EGD) WITH PROPOFOL;  Surgeon: Carol Ada, MD;  Location: [facility];  Service: Endoscopy;  Laterality: N/A;  . EYE SURGERY Bilateral   . H/O MET Test w/PFT  04/02/2012   low risk; peak VO2 77% predicted  . IR GENERIC HISTORICAL  09/12/2016   IR FLUORO GUIDE PORT INSERTION RIGHT 09/12/2016  [name], MD [facility]  . IR GENERIC HISTORICAL  09/12/2016   IR US GUIDE VASC ACCESS RIGHT 09/12/2016 [name], MD [facility]  . JOINT REPLACEMENT  2003   thumb rt  . KNEE ARTHROSCOPY  12   rt meniscus  . MEDIASTINOSCOPY N/A 10/30/2013   Procedure: MEDIASTINOSCOPY;  Surgeon: Melrose Nakayama, MD;  Location: [facility];  Service: Thoracic;  Laterality: N/A;  . ROTATOR CUFF REPAIR  2006   ? side  . THYROIDECTOMY  1973  . TRANSTHORACIC ECHOCARDIOGRAM  09/25/2012   EF 55-60%; mild LVH & mild concentric hypertrophy; mild AV regurg; RV systolic pressure increase consistent with mild pulm HTN  . VIDEO ASSISTED THORACOSCOPY (VATS)/ LOBECTOMY Right 11/13/2013   Procedure: VIDEO ASSISTED THORACOSCOPY (VATS) with mediastinal  biopsies;  Surgeon: Melrose Nakayama, MD;  Location: [facility];  Service: Thoracic;  Laterality: Right;  RIGHT VATS,mediastinal biopsies  . VIDEO BRONCHOSCOPY Bilateral 09/25/2013   Procedure: VIDEO BRONCHOSCOPY WITHOUT FLUORO;  Surgeon: Tanda Rockers, MD;   Location: [facility];  Service: Cardiopulmonary;  Laterality: Bilateral;  . VIDEO BRONCHOSCOPY WITH ENDOBRONCHIAL ULTRASOUND N/A 10/30/2013   Procedure: VIDEO BRONCHOSCOPY WITH ENDOBRONCHIAL ULTRASOUND;  Surgeon: Melrose Nakayama, MD;  Location: [facility];  Service: Thoracic;  Laterality: N/A;    REVIEW OF SYSTEMS:  Constitutional: positive for fatigue Eyes: negative Ears, nose, mouth, throat, and face: negative Respiratory: positive for dyspnea on exertion Cardiovascular: negative Gastrointestinal: positive for nausea Genitourinary:negative Integument/breast: negative Hematologic/lymphatic: negative Musculoskeletal:negative Neurological: negative Behavioral/Psych: negative Endocrine: negative Allergic/Immunologic: negative   PHYSICAL EXAMINATION: General appearance: alert, cooperative, fatigued and no distress Head: Normocephalic, without obvious abnormality, atraumatic Neck: no adenopathy, no JVD, supple, symmetrical, trachea midline and thyroid not enlarged, symmetric, no tenderness/mass/nodules Lymph nodes: Cervical, supraclavicular, and axillary nodes normal. Resp: clear to auscultation bilaterally Back: symmetric, no curvature. ROM normal. No CVA tenderness. Cardio: regular rate and rhythm, S1, S2 normal, no murmur, click, rub or gallop GI: soft, non-tender; bowel sounds normal; no masses,  no organomegaly Extremities: extremities normal, atraumatic, no cyanosis or edema Neurologic: Alert and oriented X 3, normal strength and tone. Normal symmetric reflexes. Normal coordination and gait  ECOG PERFORMANCE STATUS: 1 - Symptomatic but completely ambulatory  Blood pressure 123/70, pulse 95, temperature 98.5 F (36.9 C), temperature source Temporal, resp. rate 17, height '4\' 11"'  (1.499 m), weight 131 lb (59.4 kg), SpO2 100 %.  LABORATORY DATA: Lab Results  Component Value Date   WBC 11.2 (H) 07/24/2019   HGB 9.0 (L) 07/24/2019   HCT 27.4 (L) 07/24/2019   MCV 96.8 07/24/2019    PLT 502 (H) 07/24/2019      Chemistry      Component Value Date/Time   NA 137 07/10/2019 1301   NA 140 07/12/2017 1212   K 3.5 07/10/2019 1301   K 3.5 07/12/2017 1212   CL 96 (L) 07/10/2019 1301   CO2 28 07/10/2019 1301   CO2 27 07/12/2017 1212   BUN 20 07/10/2019 1301   BUN 27.5 (H) 07/12/2017 1212   CREATININE 0.98 07/10/2019 1301   CREATININE 1.0 07/12/2017 1212      Component Value Date/Time   CALCIUM 9.6 07/10/2019 1301   CALCIUM 10.0 07/12/2017 1212   ALKPHOS 117 07/10/2019 1301   ALKPHOS 119 07/12/2017 1212   AST 14 (L) 07/10/2019 1301   AST 16 07/12/2017 1212   ALT 9 07/10/2019 1301   ALT 15 07/12/2017 1212   BILITOT 0.3 07/10/2019 1301   BILITOT 0.48 07/12/2017 1212  RADIOGRAPHIC STUDIES: Ct Chest W Contrast  Result Date: 07/22/2019 CLINICAL DATA:  Non-small cell lung cancer. Recurrence. Status post XRT ongoing chemotherapy and immunotherapy. EXAM: CT CHEST, ABDOMEN, AND PELVIS WITH CONTRAST TECHNIQUE: Multidetector CT imaging of the chest, abdomen and pelvis was performed following the standard protocol during bolus administration of intravenous contrast. CONTRAST:  172m OMNIPAQUE IOHEXOL 300 MG/ML  SOLN COMPARISON:  05/25/2019 FINDINGS: CT CHEST FINDINGS Cardiovascular: Normal heart size. No pericardial effusion. Aortic atherosclerosis. Three vessel coronary artery calcifications. Mediastinum/Nodes: Thyroidectomy. Trachea appears patent. Unremarkable appearance of the esophagus. No enlarged supraclavicular, axillary, mediastinal or left hilar lymph nodes. Lungs/Pleura: Mild emphysema. No pleural effusion. No pneumothorax. Paramediastinal radiation change is identified within the posteromedial right upper lobe. Centrally necrotic right middle lobe lung mass measures 5.8 x 5.5 by 4.6 cm (volume = 77 cm^3), image 25/2 and image 43/5. There is chest wall invasion with tumor extending through the anterior 4-5th rib interspace, image 25/2. Tumor invades the right  hilum and right side of mediastinum. On the previous exam the mass measured are 5.6 x 4.9 by 3.7 cm (volume = 53 cm^3). There is destructive changes involving the right fourth and fifth ribs which have progressed from previous exam. Calcified granuloma within the left lower lobe is again noted. Adjacent subpleural nodule measuring 3 mm is unchanged. Musculoskeletal: Erosive changes due to direct tumor invasion of the right fourth and fifth ribs identified, image 42/5. CT ABDOMEN PELVIS FINDINGS Hepatobiliary: No focal liver abnormality is seen. No gallstones, gallbladder wall thickening, or biliary dilatation. Pancreas: Unremarkable. No pancreatic ductal dilatation or surrounding inflammatory changes. Spleen: Normal in size without focal abnormality. Adrenals/Urinary Tract: Normal left adrenal gland. Right adrenal nodule measures 1.6 by 0.8 cm, image 46/2. Previously this measured 1.1 x 0.6 cm. Complex lesion arising from the posterior upper pole of right kidney measures 1.6 cm. Previously this measured 1.4 cm. Simple appearing cyst within inferior pole of right kidney is unchanged. No hydronephrosis. Urinary bladder is unremarkable. Stomach/Bowel: Stomach is within normal limits. Appendix appears normal. No evidence of bowel wall thickening, distention, or inflammatory changes. Distal colonic diverticulosis noted without acute inflammation. Vascular/Lymphatic: Aortic atherosclerosis. No enlarged abdominal or pelvic lymph nodes. Reproductive: Uterus and bilateral adnexa are unremarkable. Other: No abdominal wall hernia or abnormality. No abdominopelvic ascites. Musculoskeletal: No acute or significant osseous findings. IMPRESSION: 1. Interval increase in size of right middle lobe necrotic lung mass with chest wall invasion and erosive changes involving the right fourth and fifth ribs. 2. Small right adrenal gland nodule is mildly increased in size from previous exam. Suspicious for metastasis. 3. Slight increase in  size of complex lesion within upper pole of the right kidney. Small renal cell carcinoma cannot be excluded. 4. Coronary artery calcifications 5. Aortic Atherosclerosis (ICD10-I70.0) and Emphysema (ICD10-J43.9). Electronically Signed   By: TKerby MoorsM.D.   On: 07/22/2019 14:08   Ct Abdomen Pelvis W Contrast  Result Date: 07/22/2019 CLINICAL DATA:  Non-small cell lung cancer. Recurrence. Status post XRT ongoing chemotherapy and immunotherapy. EXAM: CT CHEST, ABDOMEN, AND PELVIS WITH CONTRAST TECHNIQUE: Multidetector CT imaging of the chest, abdomen and pelvis was performed following the standard protocol during bolus administration of intravenous contrast. CONTRAST:  1089mOMNIPAQUE IOHEXOL 300 MG/ML  SOLN COMPARISON:  05/25/2019 FINDINGS: CT CHEST FINDINGS Cardiovascular: Normal heart size. No pericardial effusion. Aortic atherosclerosis. Three vessel coronary artery calcifications. Mediastinum/Nodes: Thyroidectomy. Trachea appears patent. Unremarkable appearance of the esophagus. No enlarged supraclavicular, axillary, mediastinal or left hilar lymph nodes. Lungs/Pleura: Mild  emphysema. No pleural effusion. No pneumothorax. Paramediastinal radiation change is identified within the posteromedial right upper lobe. Centrally necrotic right middle lobe lung mass measures 5.8 x 5.5 by 4.6 cm (volume = 77 cm^3), image 25/2 and image 43/5. There is chest wall invasion with tumor extending through the anterior 4-5th rib interspace, image 25/2. Tumor invades the right hilum and right side of mediastinum. On the previous exam the mass measured are 5.6 x 4.9 by 3.7 cm (volume = 53 cm^3). There is destructive changes involving the right fourth and fifth ribs which have progressed from previous exam. Calcified granuloma within the left lower lobe is again noted. Adjacent subpleural nodule measuring 3 mm is unchanged. Musculoskeletal: Erosive changes due to direct tumor invasion of the right fourth and fifth ribs  identified, image 42/5. CT ABDOMEN PELVIS FINDINGS Hepatobiliary: No focal liver abnormality is seen. No gallstones, gallbladder wall thickening, or biliary dilatation. Pancreas: Unremarkable. No pancreatic ductal dilatation or surrounding inflammatory changes. Spleen: Normal in size without focal abnormality. Adrenals/Urinary Tract: Normal left adrenal gland. Right adrenal nodule measures 1.6 by 0.8 cm, image 46/2. Previously this measured 1.1 x 0.6 cm. Complex lesion arising from the posterior upper pole of right kidney measures 1.6 cm. Previously this measured 1.4 cm. Simple appearing cyst within inferior pole of right kidney is unchanged. No hydronephrosis. Urinary bladder is unremarkable. Stomach/Bowel: Stomach is within normal limits. Appendix appears normal. No evidence of bowel wall thickening, distention, or inflammatory changes. Distal colonic diverticulosis noted without acute inflammation. Vascular/Lymphatic: Aortic atherosclerosis. No enlarged abdominal or pelvic lymph nodes. Reproductive: Uterus and bilateral adnexa are unremarkable. Other: No abdominal wall hernia or abnormality. No abdominopelvic ascites. Musculoskeletal: No acute or significant osseous findings. IMPRESSION: 1. Interval increase in size of right middle lobe necrotic lung mass with chest wall invasion and erosive changes involving the right fourth and fifth ribs. 2. Small right adrenal gland nodule is mildly increased in size from previous exam. Suspicious for metastasis. 3. Slight increase in size of complex lesion within upper pole of the right kidney. Small renal cell carcinoma cannot be excluded. 4. Coronary artery calcifications 5. Aortic Atherosclerosis (ICD10-I70.0) and Emphysema (ICD10-J43.9). Electronically Signed   By: Kerby Moors M.D.   On: 07/22/2019 14:08     ASSESSMENT AND PLAN: This is a very pleasant 78 years old white female with recurrent non-small cell lung cancer, squamous cell carcinoma. The patient  completed 6 cycles of systemic chemotherapy with carboplatin and paclitaxel and tolerated her treatment well except for fatigue as well as peripheral neuropathy. The patient has been in observation.  She continues to have persistent dry cough and shortness of breath. She had evidence for disease progression on restaging scan. The patient was started on treatment with second line immunotherapy with Nivolumab 480 mg IV every 4 weeks status post 17 cycles.  This treatment was discontinued secondary to disease progression. She is currently on treatment with systemic chemotherapy with single agent gemcitabine 1000 mg/M2 on days 1 and 8 every 3 weeks is status post 3 cycles.   The patient has been tolerating this treatment well with no concerning adverse effect except for the nausea. She had repeat CT scan of the chest, abdomen pelvis performed recently.  I personally and independently reviewed the scans and discussed the results with the patient today. Her scan showed evidence for disease progression with enlargement of the right upper lobe consolidative lesion with invasion of the chest wall and involvement of the right fourth and fifth ribs.  There was also invasion into the right hilar area.  There was also slight increase in the size of the small right adrenal gland nodule. I had a lengthy discussion with the patient and her daughter about her current condition and treatment options.  I recommended for the patient to discontinue her current treatment with gemcitabine because of the disease progression. I discussed with her the option of referring her back to Dr. Dorathy Daft for consideration of palliative radiotherapy to the invasive right upper lobe lung mass with chest wall invasion.  She may also benefit from SBRT to the enlarging right adrenal nodule. If the patient is candidate for this treatment then I may consider her for either observation and close monitoring or additional systemic chemotherapy likely  with reduced dose docetaxel and Cyramza. I will see her back for follow-up visit in 3 weeks for reevaluation and discussion of her treatment options based on her visit with Dr. Sondra Come. For the nausea and vomiting, the patient will continue with her current antiemetics. She was advised to call immediately if she has any concerning symptoms in the interval.  The patient voices understanding of current disease status and treatment options and is in agreement with the current care plan. All questions were answered. The patient knows to call the clinic with any problems, questions or concerns. We can certainly see the patient much sooner if necessary.   Disclaimer: This note was dictated with voice recognition software. Similar sounding words can inadvertently be transcribed and may not be corrected upon review.

## 2019-07-24 NOTE — Telephone Encounter (Signed)
Scheduled per 11/19 los, cancelled all future appointments and referral has been sent.

## 2019-07-25 ENCOUNTER — Telehealth: Payer: Self-pay

## 2019-07-25 NOTE — Telephone Encounter (Signed)
14 day ZIO ordered and mailed.[name]

## 2019-07-28 ENCOUNTER — Ambulatory Visit
Admission: RE | Admit: 2019-07-28 | Discharge: 2019-07-28 | Disposition: A | Discharge status: Home / Self Care | Payer: Medicare Other | Source: Ambulatory Visit | Attending: Radiation Oncology | Admitting: Radiation Oncology

## 2019-07-28 ENCOUNTER — Other Ambulatory Visit: Payer: Self-pay

## 2019-07-28 VITALS — BP 133/77 | HR 99 | Temp 98.3°F | Resp 16 | Ht 59.0 in | Wt 131.8 lb

## 2019-07-28 DIAGNOSIS — J45909 Unspecified asthma, uncomplicated: Secondary | ICD-10-CM | POA: Insufficient documentation

## 2019-07-28 DIAGNOSIS — K219 Gastro-esophageal reflux disease without esophagitis: Secondary | ICD-10-CM | POA: Diagnosis not present

## 2019-07-28 DIAGNOSIS — I2510 Atherosclerotic heart disease of native coronary artery without angina pectoris: Secondary | ICD-10-CM | POA: Insufficient documentation

## 2019-07-28 DIAGNOSIS — E039 Hypothyroidism, unspecified: Secondary | ICD-10-CM | POA: Diagnosis not present

## 2019-07-28 DIAGNOSIS — I10 Essential (primary) hypertension: Secondary | ICD-10-CM | POA: Diagnosis not present

## 2019-07-28 DIAGNOSIS — M1990 Unspecified osteoarthritis, unspecified site: Secondary | ICD-10-CM | POA: Diagnosis not present

## 2019-07-28 DIAGNOSIS — Z923 Personal history of irradiation: Secondary | ICD-10-CM | POA: Insufficient documentation

## 2019-07-28 DIAGNOSIS — N281 Cyst of kidney, acquired: Secondary | ICD-10-CM | POA: Diagnosis not present

## 2019-07-28 DIAGNOSIS — Z79899 Other long term (current) drug therapy: Secondary | ICD-10-CM | POA: Diagnosis not present

## 2019-07-28 DIAGNOSIS — I4891 Unspecified atrial fibrillation: Secondary | ICD-10-CM | POA: Insufficient documentation

## 2019-07-28 DIAGNOSIS — C3490 Malignant neoplasm of unspecified part of unspecified bronchus or lung: Secondary | ICD-10-CM

## 2019-07-28 DIAGNOSIS — E785 Hyperlipidemia, unspecified: Secondary | ICD-10-CM | POA: Diagnosis not present

## 2019-07-28 DIAGNOSIS — Z87891 Personal history of nicotine dependence: Secondary | ICD-10-CM | POA: Diagnosis not present

## 2019-07-28 DIAGNOSIS — J439 Emphysema, unspecified: Secondary | ICD-10-CM | POA: Insufficient documentation

## 2019-07-28 DIAGNOSIS — C342 Malignant neoplasm of middle lobe, bronchus or lung: Secondary | ICD-10-CM | POA: Diagnosis not present

## 2019-07-28 DIAGNOSIS — C7989 Secondary malignant neoplasm of other specified sites: Secondary | ICD-10-CM | POA: Diagnosis not present

## 2019-07-28 DIAGNOSIS — I700 Atherosclerosis of aorta: Secondary | ICD-10-CM | POA: Insufficient documentation

## 2019-07-28 NOTE — Progress Notes (Signed)
Radiation Oncology         ([phone]) [phone] ________________________________  Re-evaluation note  Name: Kimberly [name] MRN: [id number]  Date: 07/28/2019  DOB: 11/22/1940  PR:FFMBW, Thayer Jew, MD  Curt Bears, MD   REFERRING PHYSICIAN: Curt Bears, MD  DIAGNOSIS: The encounter diagnosis was Recurrent non-small cell lung cancer [facility]).  Recurrent non-small cell right lung cancer, squamous cell carcinoma  HISTORY OF PRESENT ILLNESS::Kimberly [name] is a 78 y.o. female who is accompanied by no one due to COVID-19 restrictions. The patient was treated here for stage IIIA non-small cell right lung cancer. Radiation treatment took place from December 01, 2013 - Jan 09, 2019 (right central chest 50.4 gray in 28 fractions). She followed up on 02/12/2014. CT of chest on 02/13/2014 showed improved right middle lobe aeration and an essentially unchanged subcarinal node. No evidence of new or progressive disease.  Follow-up CT of chest on 04/24/2014 showed right middle lobe central mass that was decreased in size and interval decrease in size of subcarinal lymph node. It also showed new patchy areas of ground-glass attenuation and airspace consolidation in the right lung, which was favored to represent sequelae of external beam radiation.  Colonoscopy was done on 02/11/2016 for chronic diarrhea. Biopsy was normal. Upper endoscopy was done on 02/08/2017 for heme positive stool. Biopsy of antral erosions in stomach showed antral mucosa with reactive gastropathy and focal erosion without intestinal metaplasia, dysplasia, or malignancy.  CT of chest on 07/10/2016 showed interval progression of abnormal subcarinal soft tissue, highly suspicious for disease progression. Soft tissue encasing the right mainstem bronchus also appeared progressed in the interval. PET scan on 07/25/2016 showed hypermetabolism corresponding to subcarinal nodal tissue, consistent with metastatic disease. It also showed right  hypermetabolism about the posterior elements of L3, which was suspicious for metastatic disease. MRI of brain and lumbar spine on 08/10/2016 was negative for metastatic disease.  CT of chest on 09/30/2018 showed measurable increase in size of right middle lobe mass with peripheral enhancement, concerning for lung cancer recurrence. No evidence of metastatic disease in the abdomen or pelvis.  Most recent CT of chest/abdomen/pelvis on 07/22/2019 showed interval increase in size of the right middle lobe necrotic mass with chest wall invasion and erosive changes involving the right fourth and fifth ribs, small right adrenal gland nodule that is mildly increasing in size and suspicious for metastasis, and slight increase in size of complex lesion within the upper pole of the right kidney without exclusion of small renal cell carcinoma.   Since 2015, she has been following up with Dr. Julien Nordmann, whom she last saw on 07/24/2019. History of chemotherapy includes concurrent chemotherapy and consolidation chemotherapy in 2015, systemic chemotherapy from 08/14/2016 - 12/11/2016, and second line treatment with immunotherapy in 2019. She was on second line treatment with immunotherapy from 05/22/2019 - 07/10/2019, which was discontinued secondary to disease progression.  PREVIOUS RADIATION THERAPY: Yes  Radiation treatment dates: December 01, 2013 through Jan 08, 2014  Site/dose: Right central chest 50.4 gray in 28 fractions  Beams/energy: 3-D conformal using three-field technique, 10 MV photons  PAST MEDICAL HISTORY:  Past Medical History:  Diagnosis Date   Allergic asthma without acute exacerbation or status asthmaticus    Aortic insufficiency    Arthritis    Atrial fibrillation (HCC)    Back pain 08/14/2016   Bronchitis    Cough    dry, endobronchial mass   Dyslipidemia    GERD (gastroesophageal reflux disease)    Heart murmur  History of blood transfusion    History of bronchitis     History of chemotherapy    History of nuclear stress test 02/26/2006   exercise myoview; normal pattern of perfusion; low risk scan    Hypertension    Hypothyroidism    Mitral insufficiency    Pneumonia    PONV (postoperative nausea and vomiting)    Radiation 12/01/13-01/08/14   50.4 gray to right central chest   Renal mass, left 04/12/2017   Shortness of breath    with exertion   Squamous cell carcinoma of lung ([place])    Syncope    "states she has passed out a few times. dr is trying to find cause.last time when geeting ready to go home after video bronch/bx    PAST SURGICAL HISTORY: Past Surgical History:  Procedure Laterality Date   CARDIAC CATHETERIZATION  07/08/2008   normal coronaries   CARPAL TUNNEL RELEASE Right 1988   COLONOSCOPY N/A 02/11/2016   Procedure: COLONOSCOPY;  Surgeon: Carol Ada, MD;  Location: [facility];  Service: Endoscopy;  Laterality: N/A;   DILATION AND CURETTAGE OF UTERUS     ESOPHAGOGASTRODUODENOSCOPY N/A 02/08/2017   Procedure: ESOPHAGOGASTRODUODENOSCOPY (EGD);  Surgeon: Carol Ada, MD;  Location: [facility];  Service: Endoscopy;  Laterality: N/A;   ESOPHAGOGASTRODUODENOSCOPY (EGD) WITH PROPOFOL N/A 03/21/2019   Procedure: ESOPHAGOGASTRODUODENOSCOPY (EGD) WITH PROPOFOL;  Surgeon: Carol Ada, MD;  Location: [facility];  Service: Endoscopy;  Laterality: N/A;   EYE SURGERY Bilateral    H/O MET Test w/PFT  04/02/2012   low risk; peak VO2 77% predicted   IR GENERIC HISTORICAL  09/12/2016   IR FLUORO GUIDE PORT INSERTION RIGHT 09/12/2016 [name], MD [facility]   IR GENERIC HISTORICAL  09/12/2016   IR US GUIDE VASC ACCESS RIGHT 09/12/2016 [name], MD [facility]   JOINT REPLACEMENT  2003   thumb rt   KNEE ARTHROSCOPY  12   rt meniscus   MEDIASTINOSCOPY N/A 10/30/2013   Procedure: MEDIASTINOSCOPY;  Surgeon: Melrose Nakayama, MD;  Location: [facility];  Service: Thoracic;  Laterality: N/A;   ROTATOR CUFF REPAIR   2006   ? side   THYROIDECTOMY  1973   TRANSTHORACIC ECHOCARDIOGRAM  09/25/2012   EF 55-60%; mild LVH & mild concentric hypertrophy; mild AV regurg; RV systolic pressure increase consistent with mild pulm HTN   VIDEO ASSISTED THORACOSCOPY (VATS)/ LOBECTOMY Right 11/13/2013   Procedure: VIDEO ASSISTED THORACOSCOPY (VATS) with mediastinal  biopsies;  Surgeon: Melrose Nakayama, MD;  Location: [facility];  Service: Thoracic;  Laterality: Right;  RIGHT VATS,mediastinal biopsies   VIDEO BRONCHOSCOPY Bilateral 09/25/2013   Procedure: VIDEO BRONCHOSCOPY WITHOUT FLUORO;  Surgeon: Tanda Rockers, MD;  Location: [facility];  Service: Cardiopulmonary;  Laterality: Bilateral;   VIDEO BRONCHOSCOPY WITH ENDOBRONCHIAL ULTRASOUND N/A 10/30/2013   Procedure: VIDEO BRONCHOSCOPY WITH ENDOBRONCHIAL ULTRASOUND;  Surgeon: Melrose Nakayama, MD;  Location[facility];  Service: Thoracic;  Laterality: N/A;    FAMILY HISTORY:  Family History  Problem Relation Age of Onset   Lung cancer Mother    Heart attack Father    Heart failure Maternal Grandfather    Heart attack Paternal Grandfather     SOCIAL HISTORY:  Social History   Tobacco Use   Smoking status: Former Smoker    Packs/day: 0.50    Years: 6.00    Pack years: 3.00    Types: Cigarettes    Quit date: 09/05/1967    Years since quitting: 51.9   Smokeless tobacco:  Never Used   Tobacco comment: occ wine  Substance Use Topics   Alcohol use: Yes    Comment: occasional 1-2 drinks/month   Drug use: No    ALLERGIES:  Allergies  Allergen Reactions   Lactose Intolerance (Gi) Other (See Comments)   Amiodarone Other (See Comments)    Corneal deposits   Augmentin [Amoxicillin-Pot Clavulanate] Diarrhea    *ended up with cdiff* Has patient had a PCN reaction causing immediate rash, facial/tongue/throat swelling, SOB or lightheadedness with hypotension: No Has patient had a PCN reaction causing severe rash involving mucus membranes or skin  necrosis: No Has patient had a PCN reaction that required hospitalization: Yes Has patient had a PCN reaction occurring within the last 10 years: Yes If all of the above answers are "NO", then may proceed with Cephalosporin use.    Milk-Related Compounds Other (See Comments)    GI upset, projectile vomiting - can't take any dairy products   Oxycontin  [Oxycodone Hcl] Other (See Comments)    MEDICATIONS:  Current Outpatient Medications  Medication Sig Dispense Refill   albuterol (VENTOLIN HFA) 108 (90 Base) MCG/ACT inhaler Inhale 1 puff into the lungs every 6 (six) hours as needed (before walking or heavy activity [try to limit to twice daily]). 1 Inhaler 5   amLODipine (NORVASC) 5 MG tablet TAKE 1 TABLET BY MOUTH EVERY DAY 90 tablet 0   atorvastatin (LIPITOR) 20 MG tablet Take 20 mg by mouth daily.      chlorthalidone (HYGROTON) 25 MG tablet Take 25 mg by mouth daily.     fluticasone (FLONASE) 50 MCG/ACT nasal spray Place 2 sprays into both nostrils daily as needed (allergies.).      levothyroxine (SYNTHROID, LEVOTHROID) 100 MCG tablet Take 100 mcg by mouth every evening.      lidocaine-prilocaine (EMLA) cream Apply 1 application topically as needed. (Patient taking differently: Apply 1 application topically as needed (for port access). ) 30 g 0   metoprolol succinate (TOPROL-XL) 25 MG 24 hr tablet Take 1 tablet (25 mg total) by mouth daily. 90 tablet 3   Mometasone Furoate (ASMANEX HFA) 100 MCG/ACT AERO Inhale 2 puffs into the lungs 2 (two) times daily. 39 g 3   montelukast (SINGULAIR) 10 MG tablet Take 10 mg by mouth at bedtime.      Multiple Vitamins-Iron (MULTIVITAMIN/IRON PO) Take 1 tablet by mouth daily with breakfast.      pantoprazole (PROTONIX) 40 MG tablet TAKE 1 TABLET BY MOUTH  TWICE DAILY 120 tablet 3   potassium chloride (KLOR-CON) 8 MEQ tablet      prochlorperazine (COMPAZINE) 10 MG tablet Take 1 tablet (10 mg total) by mouth every 6 (six) hours as needed for  nausea or vomiting. 30 tablet 0   traMADol (ULTRAM) 50 MG tablet Take 25-50 mg by mouth every 8 (eight) hours as needed (severe pain.).   3   potassium chloride (K-DUR) 10 MEQ tablet Take 2 tablets (20 mEq total) by mouth 2 (two) times daily for 10 days. 40 tablet 0   No current facility-administered medications for this encounter.     REVIEW OF SYSTEMS:  A 10+ POINT REVIEW OF SYSTEMS WAS OBTAINED including neurology, dermatology, psychiatry, cardiac, respiratory, lymph, extremities, GI, GU, musculoskeletal, constitutional, reproductive, HEENT.    PHYSICAL EXAM:  height is '4\' 11"'$  (1.499 m) and weight is 131 lb 12.8 oz (59.8 kg). Her temporal temperature is 98.3 F (36.8 C). Her blood pressure is 133/77 and her pulse is 99. Her respiration is  16 and oxygen saturation is 100%.   General: Alert and oriented, in no acute distress, uses 2 L of oxygen when up and about HEENT: Head is normocephalic. Extraocular movements are intact. Oropharynx is clear. Neck: Neck is supple, no palpable cervical or supraclavicular lymphadenopathy. Heart: Regular in rate and rhythm with no murmurs, rubs, or gallops. Chest: Clear to auscultation bilaterally, with no rhonchi, wheezes, or rales. Abdomen: Soft, nontender, nondistended, with no rigidity or guarding. Extremities: No cyanosis or edema. Lymphatics: see Neck Exam Skin: No concerning lesions. Musculoskeletal: symmetric strength and muscle tone throughout. Neurologic: Cranial nerves II through XII are grossly intact. No obvious focalities. Speech is fluent. Coordination is intact. Psychiatric: Judgment and insight are intact. Affect is appropriate.   ECOG = 1  0 - Asymptomatic (Fully active, able to carry on all predisease activities without restriction)  1 - Symptomatic but completely ambulatory (Restricted in physically strenuous activity but ambulatory and able to carry out work of a light or sedentary nature. For example, light housework, office  work)  2 - Symptomatic, <50% in bed during the day (Ambulatory and capable of all self care but unable to carry out any work activities. Up and about more than 50% of waking hours)  3 - Symptomatic, >50% in bed, but not bedbound (Capable of only limited self-care, confined to bed or chair 50% or more of waking hours)  4 - Bedbound (Completely disabled. Cannot carry on any self-care. Totally confined to bed or chair)  5 - Death   Eustace Pen MM, Creech RH, Tormey DC, et al. [phone]). "Toxicity and response criteria of the [facility]". [place] Oncol. 5 (6): 649-55  LABORATORY DATA:  Lab Results  Component Value Date   WBC 11.2 (H) 07/24/2019   HGB 9.0 (L) 07/24/2019   HCT 27.4 (L) 07/24/2019   MCV 96.8 07/24/2019   PLT 502 (H) 07/24/2019   NEUTROABS 8.8 (H) 07/24/2019   Lab Results  Component Value Date   NA 137 07/24/2019   K 3.3 (L) 07/24/2019   CL 95 (L) 07/24/2019   CO2 30 07/24/2019   GLUCOSE 120 (H) 07/24/2019   CREATININE 0.94 07/24/2019   CALCIUM 9.3 07/24/2019      RADIOGRAPHY: Ct Chest W Contrast  Result Date: 07/22/2019 CLINICAL DATA:  Non-small cell lung cancer. Recurrence. Status post XRT ongoing chemotherapy and immunotherapy. EXAM: CT CHEST, ABDOMEN, AND PELVIS WITH CONTRAST TECHNIQUE: Multidetector CT imaging of the chest, abdomen and pelvis was performed following the standard protocol during bolus administration of intravenous contrast. CONTRAST:  174m OMNIPAQUE IOHEXOL 300 MG/ML  SOLN COMPARISON:  05/25/2019 FINDINGS: CT CHEST FINDINGS Cardiovascular: Normal heart size. No pericardial effusion. Aortic atherosclerosis. Three vessel coronary artery calcifications. Mediastinum/Nodes: Thyroidectomy. Trachea appears patent. Unremarkable appearance of the esophagus. No enlarged supraclavicular, axillary, mediastinal or left hilar lymph nodes. Lungs/Pleura: Mild emphysema. No pleural effusion. No pneumothorax. Paramediastinal radiation change is  identified within the posteromedial right upper lobe. Centrally necrotic right middle lobe lung mass measures 5.8 x 5.5 by 4.6 cm (volume = 77 cm^3), image 25/2 and image 43/5. There is chest wall invasion with tumor extending through the anterior 4-5th rib interspace, image 25/2. Tumor invades the right hilum and right side of mediastinum. On the previous exam the mass measured are 5.6 x 4.9 by 3.7 cm (volume = 53 cm^3). There is destructive changes involving the right fourth and fifth ribs which have progressed from previous exam. Calcified granuloma within the left lower lobe is again  noted. Adjacent subpleural nodule measuring 3 mm is unchanged. Musculoskeletal: Erosive changes due to direct tumor invasion of the right fourth and fifth ribs identified, image 42/5. CT ABDOMEN PELVIS FINDINGS Hepatobiliary: No focal liver abnormality is seen. No gallstones, gallbladder wall thickening, or biliary dilatation. Pancreas: Unremarkable. No pancreatic ductal dilatation or surrounding inflammatory changes. Spleen: Normal in size without focal abnormality. Adrenals/Urinary Tract: Normal left adrenal gland. Right adrenal nodule measures 1.6 by 0.8 cm, image 46/2. Previously this measured 1.1 x 0.6 cm. Complex lesion arising from the posterior upper pole of right kidney measures 1.6 cm. Previously this measured 1.4 cm. Simple appearing cyst within inferior pole of right kidney is unchanged. No hydronephrosis. Urinary bladder is unremarkable. Stomach/Bowel: Stomach is within normal limits. Appendix appears normal. No evidence of bowel wall thickening, distention, or inflammatory changes. Distal colonic diverticulosis noted without acute inflammation. Vascular/Lymphatic: Aortic atherosclerosis. No enlarged abdominal or pelvic lymph nodes. Reproductive: Uterus and bilateral adnexa are unremarkable. Other: No abdominal wall hernia or abnormality. No abdominopelvic ascites. Musculoskeletal: No acute or significant osseous  findings. IMPRESSION: 1. Interval increase in size of right middle lobe necrotic lung mass with chest wall invasion and erosive changes involving the right fourth and fifth ribs. 2. Small right adrenal gland nodule is mildly increased in size from previous exam. Suspicious for metastasis. 3. Slight increase in size of complex lesion within upper pole of the right kidney. Small renal cell carcinoma cannot be excluded. 4. Coronary artery calcifications 5. Aortic Atherosclerosis (ICD10-I70.0) and Emphysema (ICD10-J43.9). Electronically Signed   By: Kerby Moors M.D.   On: 07/22/2019 14:08   Ct Abdomen Pelvis W Contrast  Result Date: 07/22/2019 CLINICAL DATA:  Non-small cell lung cancer. Recurrence. Status post XRT ongoing chemotherapy and immunotherapy. EXAM: CT CHEST, ABDOMEN, AND PELVIS WITH CONTRAST TECHNIQUE: Multidetector CT imaging of the chest, abdomen and pelvis was performed following the standard protocol during bolus administration of intravenous contrast. CONTRAST:  170m OMNIPAQUE IOHEXOL 300 MG/ML  SOLN COMPARISON:  05/25/2019 FINDINGS: CT CHEST FINDINGS Cardiovascular: Normal heart size. No pericardial effusion. Aortic atherosclerosis. Three vessel coronary artery calcifications. Mediastinum/Nodes: Thyroidectomy. Trachea appears patent. Unremarkable appearance of the esophagus. No enlarged supraclavicular, axillary, mediastinal or left hilar lymph nodes. Lungs/Pleura: Mild emphysema. No pleural effusion. No pneumothorax. Paramediastinal radiation change is identified within the posteromedial right upper lobe. Centrally necrotic right middle lobe lung mass measures 5.8 x 5.5 by 4.6 cm (volume = 77 cm^3), image 25/2 and image 43/5. There is chest wall invasion with tumor extending through the anterior 4-5th rib interspace, image 25/2. Tumor invades the right hilum and right side of mediastinum. On the previous exam the mass measured are 5.6 x 4.9 by 3.7 cm (volume = 53 cm^3). There is destructive  changes involving the right fourth and fifth ribs which have progressed from previous exam. Calcified granuloma within the left lower lobe is again noted. Adjacent subpleural nodule measuring 3 mm is unchanged. Musculoskeletal: Erosive changes due to direct tumor invasion of the right fourth and fifth ribs identified, image 42/5. CT ABDOMEN PELVIS FINDINGS Hepatobiliary: No focal liver abnormality is seen. No gallstones, gallbladder wall thickening, or biliary dilatation. Pancreas: Unremarkable. No pancreatic ductal dilatation or surrounding inflammatory changes. Spleen: Normal in size without focal abnormality. Adrenals/Urinary Tract: Normal left adrenal gland. Right adrenal nodule measures 1.6 by 0.8 cm, image 46/2. Previously this measured 1.1 x 0.6 cm. Complex lesion arising from the posterior upper pole of right kidney measures 1.6 cm. Previously this measured 1.4 cm. Simple appearing  cyst within inferior pole of right kidney is unchanged. No hydronephrosis. Urinary bladder is unremarkable. Stomach/Bowel: Stomach is within normal limits. Appendix appears normal. No evidence of bowel wall thickening, distention, or inflammatory changes. Distal colonic diverticulosis noted without acute inflammation. Vascular/Lymphatic: Aortic atherosclerosis. No enlarged abdominal or pelvic lymph nodes. Reproductive: Uterus and bilateral adnexa are unremarkable. Other: No abdominal wall hernia or abnormality. No abdominopelvic ascites. Musculoskeletal: No acute or significant osseous findings. IMPRESSION: 1. Interval increase in size of right middle lobe necrotic lung mass with chest wall invasion and erosive changes involving the right fourth and fifth ribs. 2. Small right adrenal gland nodule is mildly increased in size from previous exam. Suspicious for metastasis. 3. Slight increase in size of complex lesion within upper pole of the right kidney. Small renal cell carcinoma cannot be excluded. 4. Coronary artery  calcifications 5. Aortic Atherosclerosis (ICD10-I70.0) and Emphysema (ICD10-J43.9). Electronically Signed   By: Kerby Moors M.D.   On: 07/22/2019 14:08      IMPRESSION: Recurrent non-small cell right lung cancer, squamous cell carcinoma  Patient does have some pain in the right shoulder area which she attributes to rotator cuff injury.  She surprisingly does not have any significant pain on the right chest wall.  Recent CT scan shows chest wall invasion with erosive changes along the right fourth and fifth ribs.  Given the interval since her previous radiation therapy, I discussed with patient that she would be a candidate for retreatment ~ 3 weeks duration.  Patient does understand that she would be increased risk of complications given this is retreatment.  She understands that her breathing is could worsen or improve with retreatment but does realize that her options for management are limited at this time.    PLAN: Patient is unsure whether she would like to proceed with radiation therapy as part of her overall management.  She will call back and let us know if she wishes to proceed with retreatment.  She would like to discuss this issue with her daughter over the Thanksgiving weekend. otherwise the patient will continue close follow-up with Dr. Julien Nordmann.    ------------------------------------------------  Blair Promise, PhD, MD  This document serves as a record of services personally performed by Gery Pray, MD. It was created on his behalf by Clerance Lav, a trained medical scribe. The creation of this record is based on the scribe's personal observations and the provider's statements to them. This document has been checked and approved by the attending provider.

## 2019-07-28 NOTE — Patient Instructions (Signed)
Coronavirus (COVID-19) Are you at risk?  Are you at risk for the Coronavirus (COVID-19)?  To be considered HIGH RISK for Coronavirus (COVID-19), you have to meet the following criteria:  . Traveled to [place], [place], [place], [place] or [place]; or in the [place] to [place], [place], [place], or [place]; and have fever, cough, and shortness of breath within the last 2 weeks of travel OR . Been in close contact with a person diagnosed with COVID-19 within the last 2 weeks and have fever, cough, and shortness of breath . IF YOU DO NOT MEET THESE CRITERIA, YOU ARE CONSIDERED LOW RISK FOR COVID-19.  What to do if you are HIGH RISK for COVID-19?  . If you are having a medical emergency, call 911. . Seek medical care right away. Before you go to a doctor's office, urgent care or emergency department, call ahead and tell them about your recent travel, contact with someone diagnosed with COVID-19, and your symptoms. You should receive instructions from your physician's office regarding next steps of care.  . When you arrive at healthcare provider, tell the healthcare staff immediately you have returned from visiting [place], [place], [place], [place] or [place]; or traveled in the [place] to [place], [place], [place], or [place]; in the last two weeks or you have been in close contact with a person diagnosed with COVID-19 in the last 2 weeks.   . Tell the health care staff about your symptoms: fever, cough and shortness of breath. . After you have been seen by a medical provider, you will be either: o Tested for (COVID-19) and discharged home on quarantine except to seek medical care if symptoms worsen, and asked to  - Stay home and avoid contact with others until you get your results (4-5 days)  - Avoid travel on public transportation if possible (such as bus, train, or airplane) or o Sent to the Emergency Department by EMS for evaluation, COVID-19 testing, and possible  admission depending on your condition and test results.  What to do if you are LOW RISK for COVID-19?  Reduce your risk of any infection by using the same precautions used for avoiding the common cold or flu:  . Wash your hands often with soap and warm water for at least 20 seconds.  If soap and water are not readily available, use an alcohol-based hand sanitizer with at least 60% alcohol.  . If coughing or sneezing, cover your mouth and nose by coughing or sneezing into the elbow areas of your shirt or coat, into a tissue or into your sleeve (not your hands). . Avoid shaking hands with others and consider head nods or verbal greetings only. . Avoid touching your eyes, nose, or mouth with unwashed hands.  . Avoid close contact with people who are sick. . Avoid places or events with large numbers of people in one location, like concerts or sporting events. . Carefully consider travel plans you have or are making. . If you are planning any travel outside or inside the [place], visit the CDC's Travelers' Health webpage for the latest health notices. . If you have some symptoms but not all symptoms, continue to monitor at home and seek medical attention if your symptoms worsen. . If you are having a medical emergency, call 911.   ADDITIONAL HEALTHCARE OPTIONS FOR PATIENTS  [facility] / e-Visit: [email or web address]         [facility]: [phone]  [facility]: [phone]                   [facility]: [phone]   

## 2019-07-28 NOTE — Progress Notes (Signed)
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   Tobacco/Marijuana/Snuff/ETOH use: pt denies all use.  Past/Anticipated interventions by cardiothoracic surgery, if any: None at this time  Past/Anticipated interventions by medical oncology, if any: Per Dr. Julien Nordmann 07/24/19: She had repeat CT scan of the chest, abdomen pelvis performed recently.  I personally and independently reviewed the scans and discussed the results with the patient today. Her scan showed evidence for disease progression with enlargement of the right upper lobe consolidative lesion with invasion of the chest wall and involvement of the right fourth and fifth ribs.  There was also invasion into the right hilar area.  There was also slight increase in the size of the small right adrenal gland nodule. I had a lengthy discussion with the patient and her daughter about her current condition and treatment options.  I recommended for the patient to discontinue her current treatment with gemcitabine because of the disease progression. I discussed with her the option of referring her back to Dr. Dorathy Daft for consideration of palliative radiotherapy to the invasive right upper lobe lung mass with chest wall invasion.  She may also benefit from SBRT to the enlarging right adrenal nodule. If the patient is candidate for this treatment then I may consider her for either observation and close monitoring or additional systemic chemotherapy likely with reduced dose docetaxel and Cyramza. I will see her back for follow-up visit in 3 weeks for reevaluation and discussion of her treatment options based on her visit with Dr. Sondra Come.  Signs/Symptoms Weight changes, if any:  Wt Readings from Last 3 Encounters:  07/28/19 131 lb 12.8 oz (59.8 kg)  07/24/19 131 lb (59.4 kg)  07/16/19 131 lb 9.6 oz (59.7 kg)       The  patient is feeling fine today with no concerning complaints except for intermittent nausea.  She is currently on several antiemetics with mild improvement of her symptoms.  She was seen by her gastroenterologist Dr. Benson Norway and currently on treatment with Carafate too.  She denied having any current chest pain but has the baseline shortness of breath and she is on home oxygen with no cough or hemoptysis.  She denied having any recent fever or chills.  She has no nausea, vomiting, diarrhea or constipation.  She has no headache or visual changes.   Hemoptysis, if any: Pt reports cough with white sputum. Pt denies hemoptysis.  Pain issues, if any: Pt reports pain in right shoulder, chronic, rated 5/10.  SAFETY ISSUES: Prior radiation? Radiation treatment dates:   March 30 through May 7  Site/dose:   Right central chest 50.4 gray in 28 fractions   Beams/energy:   3-D conformal using three-field technique, 10 MV photons  Pacemaker/ICD? No  Possible current pregnancy? No  Is the patient on methotrexate? No  Current Complaints / other details:  Pt presents today for reconsult with Dr. Sondra Come for Radiation Oncology.  BP 133/77 (BP Location: Left Arm, Patient Position: Sitting)   Pulse 99   Temp 98.3 F (36.8 C) (Temporal)   Resp 16   Ht 4\' 11"  (1.499 m)   Wt 131 lb 12.8 oz (59.8 kg)   SpO2 100%   BMI 26.62 kg/m   Loma Sousa, RN BSN

## 2019-07-29 ENCOUNTER — Telehealth: Payer: Self-pay

## 2019-07-29 ENCOUNTER — Telehealth: Payer: Self-pay | Admitting: Internal Medicine

## 2019-07-29 NOTE — Telephone Encounter (Signed)
Pt calling to convey she has decided to pursue XRT. Conveyed to pt that this RN would relay message to Dr. Sondra Come and therapists would be in contact with her to schedule simulation. Pt verbalized understanding and agreement. Loma Sousa, RN BSN

## 2019-07-29 NOTE — Telephone Encounter (Signed)
The monitor was to try and determine if she is having afib - seemed necessary, but if cancer treatments are a priority now, then I understand that.  Dr Lemmie Evens

## 2019-07-29 NOTE — Telephone Encounter (Signed)
Patient is choosing to for go cancer treatment. Wants to know if the holter monitor that was ordered is still necessary.

## 2019-07-29 NOTE — Telephone Encounter (Signed)
Spoke with patient who reports radiation to shrink cancer tumor size for comfort is a priority right now. She does not want monitor at this time. She requested that Jan 2021 f/up be cancelled and she have a recall for 6 months.

## 2019-07-29 NOTE — Telephone Encounter (Signed)
Routed to Dr. Debara Pickett

## 2019-07-30 ENCOUNTER — Ambulatory Visit: Payer: Medicare Other

## 2019-07-30 ENCOUNTER — Other Ambulatory Visit: Payer: Medicare Other

## 2019-08-13 ENCOUNTER — Other Ambulatory Visit: Payer: Self-pay

## 2019-08-13 ENCOUNTER — Ambulatory Visit
Admission: RE | Admit: 2019-08-13 | Discharge: 2019-08-13 | Disposition: A | Discharge status: Home / Self Care | Payer: Medicare Other | Source: Ambulatory Visit | Attending: Radiation Oncology | Admitting: Radiation Oncology

## 2019-08-13 DIAGNOSIS — Z510 Encounter for antineoplastic radiation therapy: Secondary | ICD-10-CM | POA: Diagnosis not present

## 2019-08-13 DIAGNOSIS — C342 Malignant neoplasm of middle lobe, bronchus or lung: Secondary | ICD-10-CM | POA: Diagnosis not present

## 2019-08-13 NOTE — Progress Notes (Signed)
[facility] OFFICE PROGRESS NOTE  Deland Pretty, MD [address] [place] [address]  DIAGNOSIS: Recurrent non-small cell lung cancer, squamous cell carcinoma initially diagnosed as unresectable a stage IIIa in February 2015.  PRIOR THERAPY: 1) Status post exploratory right VATS with mediastinal biopsy under the care of Dr. Roxan Hockey on 11/13/2013. The tumor was found to be unresectable at that time. 2) Concurrent chemoradiation with weekly carboplatin for AUC of 2 and paclitaxel 45 mg/M2, status post 6 cycles with partial response. First cycle was given on 12/01/2013. 3) Consolidation chemotherapy with carboplatin for AUC of 5 and paclitaxel 175 mg/M2 every 3 weeks with Neulasta support. First dose 02/23/2014. Status post 3 cycles with partial response. 4) Systemic chemotherapy with carboplatin for AUC of 5 and paclitaxel 175 MG/M2 every 3 weeks with Neulasta support. Status post 6 cycles. First dose was given on 08/14/2016.  Last dose was giving 12/11/2016 with partial response. 5)Second line treatment with immunotherapy with Nivolumab 480 mg IV every 4 weeks, first dose Jan 25, 2018. Status post 17cycles 6) Systemic chemotherapy with gemcitabine 1000 mg/m on days 1 and 8 every 3 weeks. First dose on 05/22/2019.Status post 3 cycles.  Last dose was given July 10, 2019.  Discontinued secondary to disease progression.  CURRENT THERAPY: Observation  INTERVAL HISTORY: Kimberly [name] 78 y.o. female returns to the clinic for a follow-up visit. The patient is feeling progressively more short of breath with exertion recently. She is on 2L of oxygen via nasal cannula. At the patient's last appointment on 07/24/2019, the patient's restaging CT scan showed evidence of disease progression at the right upper lobe lesion with invasion into the right hilar area as well as the chest wall and erosion into the fourth and fifth rib. The patient is having mild pain over  her right lateral ribs for which she takes tylenol. She has a prescription for tramadol which she tries to reserve for instances where she really needs to take it. She recently met with Dr. Sondra Come from radiation oncology to discuss radiotherapy to this area.  Dr. Lois Huxley last note mentioned that given the time since she first underwent radiation treatment, she is a candidate for 3 weeks of retreatment to this area.  She has decided to pursue this and she had her first CT simulation on 12/92020. She is scheduled to begin radiation on 08/25/2019.   Today, she denies any fever, chills, night sweats, or weight loss.  She occasionally gets choked up on thick phlegm which makes her gag and throw up.  She denies any headaches or gait changes.  She denies any cough or hemoptysis. She is here today for evaluation and to discuss her treatment options.   MEDICAL HISTORY: Past Medical History:  Diagnosis Date  . Allergic asthma without acute exacerbation or status asthmaticus   . Aortic insufficiency   . Arthritis   . Atrial fibrillation ([place])   . Back pain 08/14/2016  . Bronchitis   . Cough    dry, endobronchial mass  . Dyslipidemia   . GERD (gastroesophageal reflux disease)   . Heart murmur   . History of blood transfusion   . History of bronchitis   . History of chemotherapy   . History of nuclear stress test 02/26/2006   exercise myoview; normal pattern of perfusion; low risk scan   . Hypertension   . Hypothyroidism   . Mitral insufficiency   . Pneumonia   . PONV (postoperative nausea and vomiting)   .  Radiation 12/01/13-01/08/14   50.4 gray to right central chest  . Renal mass, left 04/12/2017  . Shortness of breath    with exertion  . Squamous cell carcinoma of lung ([place])   . Syncope    "states she has passed out a few times. dr is trying to find cause.last time when geeting ready to go home after video bronch/bx    ALLERGIES:  is allergic to lactose intolerance (gi); amiodarone; augmentin  [amoxicillin-pot clavulanate]; milk-related compounds; and oxycontin  [oxycodone hcl].  MEDICATIONS:  Current Outpatient Medications  Medication Sig Dispense Refill  . albuterol (VENTOLIN HFA) 108 (90 Base) MCG/ACT inhaler Inhale 1 puff into the lungs every 6 (six) hours as needed (before walking or heavy activity [try to limit to twice daily]). 1 Inhaler 5  . amLODipine (NORVASC) 5 MG tablet TAKE 1 TABLET BY MOUTH EVERY DAY 90 tablet 0  . atorvastatin (LIPITOR) 20 MG tablet Take 20 mg by mouth daily.     . chlorthalidone (HYGROTON) 25 MG tablet Take 25 mg by mouth daily.    . fluticasone (FLONASE) 50 MCG/ACT nasal spray Place 2 sprays into both nostrils daily as needed (allergies.).     [name] levothyroxine (SYNTHROID, LEVOTHROID) 100 MCG tablet Take 100 mcg by mouth every evening.     . lidocaine-prilocaine (EMLA) cream Apply 1 application topically as needed. (Patient taking differently: Apply 1 application topically as needed (for port access). ) 30 g 0  . metoprolol succinate (TOPROL-XL) 25 MG 24 hr tablet Take 1 tablet (25 mg total) by mouth daily. 90 tablet 3  . Mometasone Furoate (ASMANEX HFA) 100 MCG/ACT AERO Inhale 2 puffs into the lungs 2 (two) times daily. 39 g 3  . montelukast (SINGULAIR) 10 MG tablet Take 10 mg by mouth at bedtime.     . Multiple Vitamins-Iron (MULTIVITAMIN/IRON PO) Take 1 tablet by mouth daily with breakfast.     . pantoprazole (PROTONIX) 40 MG tablet TAKE 1 TABLET BY MOUTH  TWICE DAILY 120 tablet 3  . potassium chloride (K-DUR) 10 MEQ tablet Take 2 tablets (20 mEq total) by mouth 2 (two) times daily for 10 days. 40 tablet 0  . potassium chloride (KLOR-CON) 8 MEQ tablet     . prochlorperazine (COMPAZINE) 10 MG tablet Take 1 tablet (10 mg total) by mouth every 6 (six) hours as needed for nausea or vomiting. 30 tablet 0  . traMADol (ULTRAM) 50 MG tablet Take 25-50 mg by mouth every 8 (eight) hours as needed (severe pain.).   3   No current facility-administered  medications for this visit.    SURGICAL HISTORY:  Past Surgical History:  Procedure Laterality Date  . CARDIAC CATHETERIZATION  07/08/2008   normal coronaries  . CARPAL TUNNEL RELEASE Right 1988  . COLONOSCOPY N/A 02/11/2016   Procedure: COLONOSCOPY;  Surgeon: Carol Ada, MD;  Location: [facility];  Service: Endoscopy;  Laterality: N/A;  . DILATION AND CURETTAGE OF UTERUS    . ESOPHAGOGASTRODUODENOSCOPY N/A 02/08/2017   Procedure: ESOPHAGOGASTRODUODENOSCOPY (EGD);  Surgeon: Carol Ada, MD;  Location: [facility];  Service: Endoscopy;  Laterality: N/A;  . ESOPHAGOGASTRODUODENOSCOPY (EGD) WITH PROPOFOL N/A 03/21/2019   Procedure: ESOPHAGOGASTRODUODENOSCOPY (EGD) WITH PROPOFOL;  Surgeon: Carol Ada, MD;  Location: [facility];  Service: Endoscopy;  Laterality: N/A;  . EYE SURGERY Bilateral   . H/O MET Test w/PFT  04/02/2012   low risk; peak VO2 77% predicted  . IR GENERIC HISTORICAL  09/12/2016   IR FLUORO GUIDE PORT INSERTION RIGHT 09/12/2016 Myrle Sheng  Laurence Ferrari, MD [facility]  . IR GENERIC HISTORICAL  09/12/2016   IR US GUIDE VASC ACCESS RIGHT 09/12/2016 [name], MD [facility]  . JOINT REPLACEMENT  2003   thumb rt  . KNEE ARTHROSCOPY  12   rt meniscus  . MEDIASTINOSCOPY N/A 10/30/2013   Procedure: MEDIASTINOSCOPY;  Surgeon: Melrose Nakayama, MD;  Location: [facility];  Service: Thoracic;  Laterality: N/A;  . ROTATOR CUFF REPAIR  2006   ? side  . THYROIDECTOMY  1973  . TRANSTHORACIC ECHOCARDIOGRAM  09/25/2012   EF 55-60%; mild LVH & mild concentric hypertrophy; mild AV regurg; RV systolic pressure increase consistent with mild pulm HTN  . VIDEO ASSISTED THORACOSCOPY (VATS)/ LOBECTOMY Right 11/13/2013   Procedure: VIDEO ASSISTED THORACOSCOPY (VATS) with mediastinal  biopsies;  Surgeon: Melrose Nakayama, MD;  Location: [facility];  Service: Thoracic;  Laterality: Right;  RIGHT VATS,mediastinal biopsies  . VIDEO BRONCHOSCOPY Bilateral 09/25/2013   Procedure: VIDEO BRONCHOSCOPY  WITHOUT FLUORO;  Surgeon: Tanda Rockers, MD;  Location: [facility];  Service: Cardiopulmonary;  Laterality: Bilateral;  . VIDEO BRONCHOSCOPY WITH ENDOBRONCHIAL ULTRASOUND N/A 10/30/2013   Procedure: VIDEO BRONCHOSCOPY WITH ENDOBRONCHIAL ULTRASOUND;  Surgeon: Melrose Nakayama, MD;  Location: [facility];  Service: Thoracic;  Laterality: N/A;    REVIEW OF SYSTEMS:   Review of Systems  Constitutional: Negative for appetite change, chills, fatigue, fever and unexpected weight change.  HENT: Negative for mouth sores, nosebleeds, sore throat and trouble swallowing.   Eyes: Negative for eye problems and icterus.  Respiratory: Positive for shortness of breath with exertion. Negative for cough, hemoptysis, and wheezing.   Cardiovascular: Positive for right lateral rib pain. Negative for leg swelling.  Gastrointestinal: Positive for vomiting secondary to gagging. Negative for abdominal pain, constipation, diarrhea, and nausea. Genitourinary: Negative for bladder incontinence, difficulty urinating, dysuria, frequency and hematuria.   Musculoskeletal: Negative for back pain, gait problem, neck pain and neck stiffness.  Skin: Negative for itching and rash.  Neurological: Negative for dizziness, extremity weakness, gait problem, headaches, light-headedness and seizures.  Hematological: Negative for adenopathy. Does not bruise/bleed easily.  Psychiatric/Behavioral: Negative for confusion, depression and sleep disturbance. The patient is not nervous/anxious.     PHYSICAL EXAMINATION:  Blood pressure 115/71, pulse 92, temperature 98.5 F (36.9 C), temperature source Temporal, resp. rate 19, height '4\' 11"'  (1.499 m), weight 129 lb 4.8 oz (58.7 kg), SpO2 100 %.  ECOG PERFORMANCE STATUS: 1 - Symptomatic but completely ambulatory  Physical Exam  Constitutional: Oriented to person, place, and time and well-developed, well-nourished, and in no distress.  HENT:  Head: Normocephalic and atraumatic.   Mouth/Throat: Oropharynx is clear and moist. No oropharyngeal exudate.  Eyes: Conjunctivae are normal. Right eye exhibits no discharge. Left eye exhibits no discharge. No scleral icterus.  Neck: Normal range of motion. Neck supple.  Cardiovascular: Normal rate, regular rhythm, normal heart sounds and intact distal pulses. 2L of oxygen via nasal cannula.  Pulmonary/Chest: Effort normal and breath sounds normal. No respiratory distress. No wheezes. No rales.  Abdominal: Soft. Bowel sounds are normal. Exhibits no distension and no mass. There is no tenderness.  Musculoskeletal: Normal range of motion. Exhibits no edema.  Lymphadenopathy:    No cervical adenopathy.  Neurological: Alert and oriented to person, place, and time. Exhibits normal muscle tone. Gait normal. Coordination normal.  Skin: Skin is warm and dry. No rash noted. Not diaphoretic. No erythema. No pallor.  Psychiatric: Mood, memory and judgment normal.  Vitals reviewed.  LABORATORY DATA: Lab  Results  Component Value Date   WBC 14.8 (H) 08/14/2019   HGB 9.9 (L) 08/14/2019   HCT 30.0 (L) 08/14/2019   MCV 96.5 08/14/2019   PLT 385 08/14/2019      Chemistry      Component Value Date/Time   NA 136 08/14/2019 1315   NA 140 07/12/2017 1212   K 3.2 (L) 08/14/2019 1315   K 3.5 07/12/2017 1212   CL 95 (L) 08/14/2019 1315   CO2 29 08/14/2019 1315   CO2 27 07/12/2017 1212   BUN 20 08/14/2019 1315   BUN 27.5 (H) 07/12/2017 1212   CREATININE 0.92 08/14/2019 1315   CREATININE 1.0 07/12/2017 1212      Component Value Date/Time   CALCIUM 9.2 08/14/2019 1315   CALCIUM 10.0 07/12/2017 1212   ALKPHOS 127 (H) 08/14/2019 1315   ALKPHOS 119 07/12/2017 1212   AST 12 (L) 08/14/2019 1315   AST 16 07/12/2017 1212   ALT 9 08/14/2019 1315   ALT 15 07/12/2017 1212   BILITOT 0.3 08/14/2019 1315   BILITOT 0.48 07/12/2017 1212       RADIOGRAPHIC STUDIES:  CT Chest W Contrast  Result Date: 07/22/2019 CLINICAL DATA:  Non-small  cell lung cancer. Recurrence. Status post XRT ongoing chemotherapy and immunotherapy. EXAM: CT CHEST, ABDOMEN, AND PELVIS WITH CONTRAST TECHNIQUE: Multidetector CT imaging of the chest, abdomen and pelvis was performed following the standard protocol during bolus administration of intravenous contrast. CONTRAST:  144m OMNIPAQUE IOHEXOL 300 MG/ML  SOLN COMPARISON:  05/25/2019 FINDINGS: CT CHEST FINDINGS Cardiovascular: Normal heart size. No pericardial effusion. Aortic atherosclerosis. Three vessel coronary artery calcifications. Mediastinum/Nodes: Thyroidectomy. Trachea appears patent. Unremarkable appearance of the esophagus. No enlarged supraclavicular, axillary, mediastinal or left hilar lymph nodes. Lungs/Pleura: Mild emphysema. No pleural effusion. No pneumothorax. Paramediastinal radiation change is identified within the posteromedial right upper lobe. Centrally necrotic right middle lobe lung mass measures 5.8 x 5.5 by 4.6 cm (volume = 77 cm^3), image 25/2 and image 43/5. There is chest wall invasion with tumor extending through the anterior 4-5th rib interspace, image 25/2. Tumor invades the right hilum and right side of mediastinum. On the previous exam the mass measured are 5.6 x 4.9 by 3.7 cm (volume = 53 cm^3). There is destructive changes involving the right fourth and fifth ribs which have progressed from previous exam. Calcified granuloma within the left lower lobe is again noted. Adjacent subpleural nodule measuring 3 mm is unchanged. Musculoskeletal: Erosive changes due to direct tumor invasion of the right fourth and fifth ribs identified, image 42/5. CT ABDOMEN PELVIS FINDINGS Hepatobiliary: No focal liver abnormality is seen. No gallstones, gallbladder wall thickening, or biliary dilatation. Pancreas: Unremarkable. No pancreatic ductal dilatation or surrounding inflammatory changes. Spleen: Normal in size without focal abnormality. Adrenals/Urinary Tract: Normal left adrenal gland. Right adrenal  nodule measures 1.6 by 0.8 cm, image 46/2. Previously this measured 1.1 x 0.6 cm. Complex lesion arising from the posterior upper pole of right kidney measures 1.6 cm. Previously this measured 1.4 cm. Simple appearing cyst within inferior pole of right kidney is unchanged. No hydronephrosis. Urinary bladder is unremarkable. Stomach/Bowel: Stomach is within normal limits. Appendix appears normal. No evidence of bowel wall thickening, distention, or inflammatory changes. Distal colonic diverticulosis noted without acute inflammation. Vascular/Lymphatic: Aortic atherosclerosis. No enlarged abdominal or pelvic lymph nodes. Reproductive: Uterus and bilateral adnexa are unremarkable. Other: No abdominal wall hernia or abnormality. No abdominopelvic ascites. Musculoskeletal: No acute or significant osseous findings. IMPRESSION: 1. Interval increase in size  of right middle lobe necrotic lung mass with chest wall invasion and erosive changes involving the right fourth and fifth ribs. 2. Small right adrenal gland nodule is mildly increased in size from previous exam. Suspicious for metastasis. 3. Slight increase in size of complex lesion within upper pole of the right kidney. Small renal cell carcinoma cannot be excluded. 4. Coronary artery calcifications 5. Aortic Atherosclerosis (ICD10-I70.0) and Emphysema (ICD10-J43.9). Electronically Signed   By: Kerby Moors M.D.   On: 07/22/2019 14:08   CT Abdomen Pelvis W Contrast  Result Date: 07/22/2019 CLINICAL DATA:  Non-small cell lung cancer. Recurrence. Status post XRT ongoing chemotherapy and immunotherapy. EXAM: CT CHEST, ABDOMEN, AND PELVIS WITH CONTRAST TECHNIQUE: Multidetector CT imaging of the chest, abdomen and pelvis was performed following the standard protocol during bolus administration of intravenous contrast. CONTRAST:  177m OMNIPAQUE IOHEXOL 300 MG/ML  SOLN COMPARISON:  05/25/2019 FINDINGS: CT CHEST FINDINGS Cardiovascular: Normal heart size. No pericardial  effusion. Aortic atherosclerosis. Three vessel coronary artery calcifications. Mediastinum/Nodes: Thyroidectomy. Trachea appears patent. Unremarkable appearance of the esophagus. No enlarged supraclavicular, axillary, mediastinal or left hilar lymph nodes. Lungs/Pleura: Mild emphysema. No pleural effusion. No pneumothorax. Paramediastinal radiation change is identified within the posteromedial right upper lobe. Centrally necrotic right middle lobe lung mass measures 5.8 x 5.5 by 4.6 cm (volume = 77 cm^3), image 25/2 and image 43/5. There is chest wall invasion with tumor extending through the anterior 4-5th rib interspace, image 25/2. Tumor invades the right hilum and right side of mediastinum. On the previous exam the mass measured are 5.6 x 4.9 by 3.7 cm (volume = 53 cm^3). There is destructive changes involving the right fourth and fifth ribs which have progressed from previous exam. Calcified granuloma within the left lower lobe is again noted. Adjacent subpleural nodule measuring 3 mm is unchanged. Musculoskeletal: Erosive changes due to direct tumor invasion of the right fourth and fifth ribs identified, image 42/5. CT ABDOMEN PELVIS FINDINGS Hepatobiliary: No focal liver abnormality is seen. No gallstones, gallbladder wall thickening, or biliary dilatation. Pancreas: Unremarkable. No pancreatic ductal dilatation or surrounding inflammatory changes. Spleen: Normal in size without focal abnormality. Adrenals/Urinary Tract: Normal left adrenal gland. Right adrenal nodule measures 1.6 by 0.8 cm, image 46/2. Previously this measured 1.1 x 0.6 cm. Complex lesion arising from the posterior upper pole of right kidney measures 1.6 cm. Previously this measured 1.4 cm. Simple appearing cyst within inferior pole of right kidney is unchanged. No hydronephrosis. Urinary bladder is unremarkable. Stomach/Bowel: Stomach is within normal limits. Appendix appears normal. No evidence of bowel wall thickening, distention, or  inflammatory changes. Distal colonic diverticulosis noted without acute inflammation. Vascular/Lymphatic: Aortic atherosclerosis. No enlarged abdominal or pelvic lymph nodes. Reproductive: Uterus and bilateral adnexa are unremarkable. Other: No abdominal wall hernia or abnormality. No abdominopelvic ascites. Musculoskeletal: No acute or significant osseous findings. IMPRESSION: 1. Interval increase in size of right middle lobe necrotic lung mass with chest wall invasion and erosive changes involving the right fourth and fifth ribs. 2. Small right adrenal gland nodule is mildly increased in size from previous exam. Suspicious for metastasis. 3. Slight increase in size of complex lesion within upper pole of the right kidney. Small renal cell carcinoma cannot be excluded. 4. Coronary artery calcifications 5. Aortic Atherosclerosis (ICD10-I70.0) and Emphysema (ICD10-J43.9). Electronically Signed   By: TKerby MoorsM.D.   On: 07/22/2019 14:08     ASSESSMENT/PLAN:  This isa very pleasant 78year old Caucasian female with recurrent non-small cell lung cancer, squamousecell carcinoma. She  was diagnosed in February 2015. She presented with a right middle lobe lung mass and subcarinal lymphadenopathy.   She completed 6 cycles of systemic chemotherapy with carboplatin and paclitaxel. She tolerated it well except for fatigue and peripheral neuropathy. She wason observation but showed evidence of disease progression.  She then was treated with second line immunotherapy with nivolumab 480 mg IV every 4 weeks. She is status post 17 cycles. This was discontinued secondary to disease progression.  She then underwent systemic chemotherapy with gemcitabine 1000 mg/m on days 1 and 8 every 3 weeks.  She is status post 3 cycles but it was recently discontinued secondary to evidence of disease progression.  The patient is currently planning on undergoing retreatment with radiation to the right upper lobe lesion.   She had her first CT simulation yesterday.  The patient was seen with Dr. Julien Nordmann today.  Dr. Earlie Server had a lengthy discussion today with the patient about her current condition and treatment options.  The patient was given the option of continuing on observation vs starting systemic chemotherapy with a reduced dose of docetaxel and Cyramza.  The patient expressed that she is not interested in undergoing further chemotherapy at this time. She has spent a lot of time discussing her condition with her family. She would like to continue on observation.   We will arrange for the patient to have a restaging CT scan of the chest, abdomen, and pelvis in 2-3 months  We will see her back for a follow up visit in 2-3 months for evaluation and to review her scan results.   She will complete radiotherapy as planned starting on 08/25/2019.   The patient is not interested in any referrals to palliative care/hospice at this point. Her pain secondary to her metastatic disease to the ribs is managed with tylenol. She has a prescription for tramadol if needed.   Her potassium was a little low today. She currently is prescribed potassium by her PCP which she takes 8 mcg every other day. She was advised to take two tablets daily for 7 days before returning to her usual regimen. The patient was advised to call immediately if she has any concerning symptoms in the interval. The patient voices understanding of current disease status and treatment options and is in agreement with the current care plan. All questions were answered. The patient knows to call the clinic with any problems, questions or concerns. We can certainly see the patient much sooner if necessary  Orders Placed This Encounter  Procedures  . CT Chest W Contrast    Standing Status:   Future    Standing Expiration Date:   08/13/2020    Order Specific Question:   ** REASON FOR EXAM (FREE TEXT)    Answer:   Restaging Lung Cancer    Order Specific  Question:   If indicated for the ordered procedure, I authorize the administration of contrast media per Radiology protocol    Answer:   Yes    Order Specific Question:   Preferred imaging location?    Answer:   [facility]    Order Specific Question:   Radiology Contrast Protocol - do NOT remove file path    Answer:   [email or web address]  . CT Abdomen Pelvis W Contrast    Standing Status:   Future    Standing Expiration Date:   08/13/2020    Order Specific Question:   ** REASON FOR EXAM (FREE TEXT)    Answer:   Restaging  Lung Cancer    Order Specific Question:   If indicated for the ordered procedure, I authorize the administration of contrast media per Radiology protocol    Answer:   Yes    Order Specific Question:   Preferred imaging location?    Answer:   [facility]    Order Specific Question:   Is Oral Contrast requested for this exam?    Answer:   Yes, Per Radiology protocol    Order Specific Question:   Radiology Contrast Protocol - do NOT remove file path    Answer:   [email or web address]     [place], PA-C 08/14/19  ADDENDUM: Hematology/Oncology Attending: I had a face-to-face encounter with the patient today.  I recommended her care plan.  This is a very pleasant 78 years old white female with recurrent non-small cell lung cancer, squamous cell carcinoma diagnosed in February 2015 status post several chemotherapy regimens as well as radiation and immunotherapy.  She was most recently treated with nivolumab discontinued secondary to disease progression. Her most recent CT scan of the chest showed evidence for chest wall invasion with locally progressive disease in the right upper lobe.  The patient is seen by radiation oncology and she is expected to start a course of palliative radiotherapy to the invasive disease in the right upper lobe. She had a discussion with her family about her future  treatment options and she is considering stopping all future systemic therapy. I had a discussion with the patient today about her condition and recommended for her to proceed with the palliative radiotherapy as planned. We will arrange for the patient to have repeat CT scan of the chest, abdomen and pelvis in 3 months for reevaluation and restaging of her disease. Depending on the scan results, will discuss with the patient her treatment options including palliative care and hospice or other systemic therapy if she is interested at that time. She is in agreement with the current plan. She was advised to call immediately if she has any concerning symptoms in the interval.  Disclaimer: This note was dictated with voice recognition software. Similar sounding words can inadvertently be transcribed and may be missed upon review. Eilleen Kempf, MD 08/14/19

## 2019-08-14 ENCOUNTER — Inpatient Hospital Stay: Payer: Medicare Other

## 2019-08-14 ENCOUNTER — Inpatient Hospital Stay: Payer: Medicare Other | Attending: Internal Medicine | Admitting: Physician Assistant

## 2019-08-14 ENCOUNTER — Other Ambulatory Visit: Payer: Self-pay

## 2019-08-14 ENCOUNTER — Encounter: Payer: Self-pay | Admitting: Physician Assistant

## 2019-08-14 ENCOUNTER — Ambulatory Visit: Payer: Medicare Other

## 2019-08-14 VITALS — BP 115/71 | HR 92 | Temp 98.5°F | Resp 19 | Ht 59.0 in | Wt 129.3 lb

## 2019-08-14 DIAGNOSIS — C342 Malignant neoplasm of middle lobe, bronchus or lung: Secondary | ICD-10-CM | POA: Insufficient documentation

## 2019-08-14 DIAGNOSIS — Z7189 Other specified counseling: Secondary | ICD-10-CM | POA: Diagnosis not present

## 2019-08-14 DIAGNOSIS — Z923 Personal history of irradiation: Secondary | ICD-10-CM | POA: Insufficient documentation

## 2019-08-14 DIAGNOSIS — Z79899 Other long term (current) drug therapy: Secondary | ICD-10-CM | POA: Diagnosis not present

## 2019-08-14 DIAGNOSIS — J439 Emphysema, unspecified: Secondary | ICD-10-CM | POA: Diagnosis not present

## 2019-08-14 DIAGNOSIS — E785 Hyperlipidemia, unspecified: Secondary | ICD-10-CM | POA: Diagnosis not present

## 2019-08-14 DIAGNOSIS — I10 Essential (primary) hypertension: Secondary | ICD-10-CM | POA: Diagnosis not present

## 2019-08-14 DIAGNOSIS — Z7951 Long term (current) use of inhaled steroids: Secondary | ICD-10-CM | POA: Diagnosis not present

## 2019-08-14 DIAGNOSIS — C7951 Secondary malignant neoplasm of bone: Secondary | ICD-10-CM | POA: Insufficient documentation

## 2019-08-14 DIAGNOSIS — I4891 Unspecified atrial fibrillation: Secondary | ICD-10-CM | POA: Insufficient documentation

## 2019-08-14 DIAGNOSIS — E860 Dehydration: Secondary | ICD-10-CM

## 2019-08-14 DIAGNOSIS — C3490 Malignant neoplasm of unspecified part of unspecified bronchus or lung: Secondary | ICD-10-CM

## 2019-08-14 DIAGNOSIS — Z9221 Personal history of antineoplastic chemotherapy: Secondary | ICD-10-CM | POA: Diagnosis not present

## 2019-08-14 DIAGNOSIS — E876 Hypokalemia: Secondary | ICD-10-CM

## 2019-08-14 DIAGNOSIS — E039 Hypothyroidism, unspecified: Secondary | ICD-10-CM | POA: Insufficient documentation

## 2019-08-14 LAB — CMP (CANCER CENTER ONLY)
ALT: 9 U/L (ref 0–44)
AST: 12 U/L — ABNORMAL LOW (ref 15–41)
Albumin: 3.1 g/dL — ABNORMAL LOW (ref 3.5–5.0)
Alkaline Phosphatase: 127 U/L — ABNORMAL HIGH (ref 38–126)
Anion gap: 12 (ref 5–15)
BUN: 20 mg/dL (ref 8–23)
CO2: 29 mmol/L (ref 22–32)
Calcium: 9.2 mg/dL (ref 8.9–10.3)
Chloride: 95 mmol/L — ABNORMAL LOW (ref 98–111)
Creatinine: 0.92 mg/dL (ref 0.44–1.00)
GFR, Est AFR Am: >60 mL/min (ref >60)
GFR, Estimated: >60 mL/min (ref >60)
Glucose, Bld: 126 mg/dL — ABNORMAL HIGH (ref 70–99)
Potassium: 3.2 mmol/L — ABNORMAL LOW (ref 3.5–5.1)
Sodium: 136 mmol/L (ref 135–145)
Total Bilirubin: 0.3 mg/dL (ref 0.3–1.2)
Total Protein: 6.9 g/dL (ref 6.5–8.1)

## 2019-08-14 LAB — CBC WITH DIFFERENTIAL (CANCER CENTER ONLY)
Abs Immature Granulocytes: 0.06 10*3/uL (ref 0.00–0.07)
Basophils Absolute: 0.1 10*3/uL (ref 0.0–0.1)
Basophils Relative: 1 %
Eosinophils Absolute: 0.3 10*3/uL (ref 0.0–0.5)
Eosinophils Relative: 2 %
HCT: 30 % — ABNORMAL LOW (ref 36.0–46.0)
Hemoglobin: 9.9 g/dL — ABNORMAL LOW (ref 12.0–15.0)
Immature Granulocytes: 0 %
Lymphocytes Relative: 10 %
Lymphs Abs: 1.4 10*3/uL (ref 0.7–4.0)
MCH: 31.8 pg (ref 26.0–34.0)
MCHC: 33 g/dL (ref 30.0–36.0)
MCV: 96.5 fL (ref 80.0–100.0)
Monocytes Absolute: 1 10*3/uL (ref 0.1–1.0)
Monocytes Relative: 7 %
Neutro Abs: 12 10*3/uL — ABNORMAL HIGH (ref 1.7–7.7)
Neutrophils Relative %: 80 %
Platelet Count: 385 10*3/uL (ref 150–400)
RBC: 3.11 MIL/uL — ABNORMAL LOW (ref 3.87–5.11)
RDW: 15.8 % — ABNORMAL HIGH (ref 11.5–15.5)
WBC Count: 14.8 10*3/uL — ABNORMAL HIGH (ref 4.0–10.5)
nRBC: 0 % (ref 0.0–0.2)

## 2019-08-14 MED ORDER — SODIUM CHLORIDE 0.9% FLUSH
10.0000 mL | INTRAVENOUS | Status: DC | PRN
  Administered 2019-08-14: 10 mL
  Filled 2019-08-14: qty 10

## 2019-08-14 MED ORDER — HEPARIN SOD (PORK) LOCK FLUSH 100 UNIT/ML IV SOLN
500.0000 [IU] | Freq: Once | INTRAVENOUS | Status: AC | PRN
  Administered 2019-08-14: 500 [IU]
  Filled 2019-08-14: qty 5

## 2019-08-15 ENCOUNTER — Telehealth: Payer: Self-pay | Admitting: Internal Medicine

## 2019-08-15 NOTE — Telephone Encounter (Signed)
Scheduled per los. Called and left mssg. Mailed printout

## 2019-08-17 DIAGNOSIS — C3490 Malignant neoplasm of unspecified part of unspecified bronchus or lung: Secondary | ICD-10-CM | POA: Diagnosis not present

## 2019-08-17 DIAGNOSIS — J9611 Chronic respiratory failure with hypoxia: Secondary | ICD-10-CM | POA: Diagnosis not present

## 2019-08-18 DIAGNOSIS — Z510 Encounter for antineoplastic radiation therapy: Secondary | ICD-10-CM | POA: Diagnosis not present

## 2019-08-18 DIAGNOSIS — C342 Malignant neoplasm of middle lobe, bronchus or lung: Secondary | ICD-10-CM | POA: Diagnosis not present

## 2019-08-21 ENCOUNTER — Other Ambulatory Visit: Payer: Medicare Other

## 2019-08-21 ENCOUNTER — Ambulatory Visit: Payer: Medicare Other

## 2019-08-22 ENCOUNTER — Other Ambulatory Visit: Payer: Self-pay | Admitting: Radiation Therapy

## 2019-08-25 ENCOUNTER — Other Ambulatory Visit: Payer: Self-pay

## 2019-08-25 ENCOUNTER — Ambulatory Visit
Admission: RE | Admit: 2019-08-25 | Discharge: 2019-08-25 | Disposition: A | Discharge status: Home / Self Care | Payer: Medicare Other | Source: Ambulatory Visit | Attending: Radiation Oncology | Admitting: Radiation Oncology

## 2019-08-25 DIAGNOSIS — C342 Malignant neoplasm of middle lobe, bronchus or lung: Secondary | ICD-10-CM | POA: Diagnosis not present

## 2019-08-25 DIAGNOSIS — Z510 Encounter for antineoplastic radiation therapy: Secondary | ICD-10-CM | POA: Diagnosis not present

## 2019-08-25 DIAGNOSIS — C3430 Malignant neoplasm of lower lobe, unspecified bronchus or lung: Secondary | ICD-10-CM

## 2019-08-25 MED ORDER — SONAFINE EX EMUL
1.0000 "application " | Freq: Two times a day (BID) | CUTANEOUS | Status: DC
  Administered 2019-08-25: 1 via TOPICAL

## 2019-08-26 ENCOUNTER — Ambulatory Visit
Admission: RE | Admit: 2019-08-26 | Discharge: 2019-08-26 | Disposition: A | Discharge status: Home / Self Care | Payer: Medicare Other | Source: Ambulatory Visit | Attending: Radiation Oncology | Admitting: Radiation Oncology

## 2019-08-26 ENCOUNTER — Other Ambulatory Visit: Payer: Self-pay

## 2019-08-26 ENCOUNTER — Inpatient Hospital Stay: Payer: Medicare Other

## 2019-08-26 ENCOUNTER — Other Ambulatory Visit: Payer: Self-pay | Admitting: Internal Medicine

## 2019-08-26 DIAGNOSIS — C342 Malignant neoplasm of middle lobe, bronchus or lung: Secondary | ICD-10-CM | POA: Diagnosis not present

## 2019-08-26 DIAGNOSIS — Z510 Encounter for antineoplastic radiation therapy: Secondary | ICD-10-CM | POA: Diagnosis not present

## 2019-08-27 ENCOUNTER — Ambulatory Visit
Admission: RE | Admit: 2019-08-27 | Discharge: 2019-08-27 | Disposition: A | Discharge status: Home / Self Care | Payer: Medicare Other | Source: Ambulatory Visit | Attending: Radiation Oncology | Admitting: Radiation Oncology

## 2019-08-27 ENCOUNTER — Other Ambulatory Visit: Payer: Self-pay

## 2019-08-27 DIAGNOSIS — Z510 Encounter for antineoplastic radiation therapy: Secondary | ICD-10-CM | POA: Diagnosis not present

## 2019-08-27 DIAGNOSIS — C342 Malignant neoplasm of middle lobe, bronchus or lung: Secondary | ICD-10-CM | POA: Diagnosis not present

## 2019-08-28 ENCOUNTER — Other Ambulatory Visit: Payer: Self-pay

## 2019-08-28 ENCOUNTER — Ambulatory Visit
Admission: RE | Admit: 2019-08-28 | Discharge: 2019-08-28 | Disposition: A | Discharge status: Home / Self Care | Payer: Medicare Other | Source: Ambulatory Visit | Attending: Radiation Oncology | Admitting: Radiation Oncology

## 2019-08-28 DIAGNOSIS — C342 Malignant neoplasm of middle lobe, bronchus or lung: Secondary | ICD-10-CM | POA: Diagnosis not present

## 2019-08-28 DIAGNOSIS — Z510 Encounter for antineoplastic radiation therapy: Secondary | ICD-10-CM | POA: Diagnosis not present

## 2019-09-01 ENCOUNTER — Ambulatory Visit
Admission: RE | Admit: 2019-09-01 | Discharge: 2019-09-01 | Disposition: A | Discharge status: Home / Self Care | Payer: Medicare Other | Source: Ambulatory Visit | Attending: Radiation Oncology | Admitting: Radiation Oncology

## 2019-09-01 ENCOUNTER — Other Ambulatory Visit: Payer: Self-pay

## 2019-09-01 DIAGNOSIS — C342 Malignant neoplasm of middle lobe, bronchus or lung: Secondary | ICD-10-CM | POA: Diagnosis not present

## 2019-09-01 DIAGNOSIS — Z510 Encounter for antineoplastic radiation therapy: Secondary | ICD-10-CM | POA: Diagnosis not present

## 2019-09-02 ENCOUNTER — Ambulatory Visit
Admission: RE | Admit: 2019-09-02 | Discharge: 2019-09-02 | Disposition: A | Discharge status: Home / Self Care | Payer: Medicare Other | Source: Ambulatory Visit | Attending: Internal Medicine | Admitting: Internal Medicine

## 2019-09-02 ENCOUNTER — Ambulatory Visit
Admission: RE | Admit: 2019-09-02 | Discharge: 2019-09-02 | Disposition: A | Discharge status: Home / Self Care | Payer: Medicare Other | Source: Ambulatory Visit | Attending: Radiation Oncology | Admitting: Radiation Oncology

## 2019-09-02 ENCOUNTER — Encounter: Payer: Self-pay | Admitting: General Practice

## 2019-09-02 ENCOUNTER — Other Ambulatory Visit: Payer: Self-pay

## 2019-09-02 DIAGNOSIS — C3490 Malignant neoplasm of unspecified part of unspecified bronchus or lung: Secondary | ICD-10-CM

## 2019-09-02 DIAGNOSIS — J701 Chronic and other pulmonary manifestations due to radiation: Secondary | ICD-10-CM

## 2019-09-02 DIAGNOSIS — T451X5A Adverse effect of antineoplastic and immunosuppressive drugs, initial encounter: Secondary | ICD-10-CM

## 2019-09-02 DIAGNOSIS — C342 Malignant neoplasm of middle lobe, bronchus or lung: Secondary | ICD-10-CM | POA: Diagnosis not present

## 2019-09-02 DIAGNOSIS — Z510 Encounter for antineoplastic radiation therapy: Secondary | ICD-10-CM | POA: Diagnosis not present

## 2019-09-02 DIAGNOSIS — G620 Drug-induced polyneuropathy: Secondary | ICD-10-CM

## 2019-09-02 DIAGNOSIS — E46 Unspecified protein-calorie malnutrition: Secondary | ICD-10-CM

## 2019-09-02 DIAGNOSIS — D701 Agranulocytosis secondary to cancer chemotherapy: Secondary | ICD-10-CM

## 2019-09-02 MED ORDER — MORPHINE SULFATE (CONCENTRATE) 20 MG/ML PO SOLN: 10.0000 mg | ORAL | 0 refills | Status: AC | PRN

## 2019-09-02 NOTE — Progress Notes (Signed)
[place] [facility] Spiritual Care Note  Made an in-person co-visit with Dr Rhea Pink of Palliative Medicine to introduce palliative care and explore Robin's goals for her care. I am acquainted with Robin through [facility] support group.  --QOL: Robin prioritizes quality of life over quantity, stating that she will not do chemo again because it decreases her quality so significantly.   --XRT: She understands that current radiation treatment is palliative (to reduce pain, which she states was at a steady 5-6 before radiation treatments began, and thus to increase quality of life).  --Hospice: As Dr Hilma Favors will note, Shirlean Mylar was very receptive to the additional layers of support that hospice could provide, especially in the setting of her goal to stay independent at home as long as possible and then to move in with her daughter in [place] when she ultimately needs more care.  --Meaning-making: Shirlean Mylar has had to reimagine her ways of contributing and making meaning through several iterations due to covid and stamina decline. 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 Robin valued Dr Delanna Ahmadi affirmation that her focus on living in the present moment is a true gift to herself and others.  Robin and I plan to follow up by phone next week to speak in more detail about meaning-making, purpose, coping, and goals. Please also page if needs arise or circumstances change. Thank you.  [place], [place], [facility] Pager [phone] Voicemail [phone]

## 2019-09-03 ENCOUNTER — Other Ambulatory Visit: Payer: Self-pay

## 2019-09-03 ENCOUNTER — Ambulatory Visit
Admission: RE | Admit: 2019-09-03 | Discharge: 2019-09-03 | Disposition: A | Discharge status: Home / Self Care | Payer: Medicare Other | Source: Ambulatory Visit | Attending: Radiation Oncology | Admitting: Radiation Oncology

## 2019-09-03 DIAGNOSIS — C342 Malignant neoplasm of middle lobe, bronchus or lung: Secondary | ICD-10-CM | POA: Diagnosis not present

## 2019-09-03 DIAGNOSIS — Z510 Encounter for antineoplastic radiation therapy: Secondary | ICD-10-CM | POA: Diagnosis not present

## 2019-09-03 NOTE — Progress Notes (Signed)
[facility]: Referral received from Dr. Hilma Favors on pt who is currently at home. We will reach and to Dr. Julien Nordmann, [facility] tomorrow for orders and the pt to confirm interest with hospice services. Thank you for this referral  Webb Silversmith RN [phone]

## 2019-09-04 ENCOUNTER — Ambulatory Visit: Payer: Medicare Other | Admitting: Internal Medicine

## 2019-09-04 ENCOUNTER — Other Ambulatory Visit: Payer: Medicare Other

## 2019-09-04 ENCOUNTER — Ambulatory Visit
Admission: RE | Admit: 2019-09-04 | Discharge: 2019-09-04 | Disposition: A | Discharge status: Home / Self Care | Payer: Medicare Other | Source: Ambulatory Visit | Attending: Radiation Oncology | Admitting: Radiation Oncology

## 2019-09-04 ENCOUNTER — Other Ambulatory Visit: Payer: Self-pay

## 2019-09-04 ENCOUNTER — Ambulatory Visit: Payer: Medicare Other

## 2019-09-04 DIAGNOSIS — C342 Malignant neoplasm of middle lobe, bronchus or lung: Secondary | ICD-10-CM | POA: Diagnosis not present

## 2019-09-04 DIAGNOSIS — Z510 Encounter for antineoplastic radiation therapy: Secondary | ICD-10-CM | POA: Diagnosis not present

## 2019-09-08 ENCOUNTER — Encounter: Payer: Self-pay | Admitting: General Practice

## 2019-09-08 ENCOUNTER — Other Ambulatory Visit: Payer: Self-pay

## 2019-09-08 ENCOUNTER — Ambulatory Visit
Admission: RE | Admit: 2019-09-08 | Discharge: 2019-09-08 | Disposition: A | Discharge status: Home / Self Care | Payer: Medicare Other | Source: Ambulatory Visit | Attending: Radiation Oncology | Admitting: Radiation Oncology

## 2019-09-08 DIAGNOSIS — C342 Malignant neoplasm of middle lobe, bronchus or lung: Secondary | ICD-10-CM | POA: Diagnosis not present

## 2019-09-08 DIAGNOSIS — Z510 Encounter for antineoplastic radiation therapy: Secondary | ICD-10-CM | POA: Insufficient documentation

## 2019-09-08 NOTE — Progress Notes (Signed)
[facility] Spiritual Care Note  Attempted to reach [place] by phone as planned; LVM and will keep trying.   [place], [place], [facility] Pager [phone] Voicemail [phone]

## 2019-09-09 ENCOUNTER — Ambulatory Visit
Admission: RE | Admit: 2019-09-09 | Discharge: 2019-09-09 | Disposition: A | Discharge status: Home / Self Care | Payer: Medicare Other | Source: Ambulatory Visit | Attending: Radiation Oncology | Admitting: Radiation Oncology

## 2019-09-09 ENCOUNTER — Other Ambulatory Visit: Payer: Self-pay

## 2019-09-09 DIAGNOSIS — Z510 Encounter for antineoplastic radiation therapy: Secondary | ICD-10-CM | POA: Diagnosis not present

## 2019-09-09 DIAGNOSIS — C342 Malignant neoplasm of middle lobe, bronchus or lung: Secondary | ICD-10-CM | POA: Diagnosis not present

## 2019-09-10 ENCOUNTER — Encounter: Payer: Self-pay | Admitting: General Practice

## 2019-09-10 ENCOUNTER — Ambulatory Visit: Payer: Medicare Other | Admitting: Internal Medicine

## 2019-09-10 ENCOUNTER — Other Ambulatory Visit: Payer: Self-pay

## 2019-09-10 ENCOUNTER — Ambulatory Visit
Admission: RE | Admit: 2019-09-10 | Discharge: 2019-09-10 | Disposition: A | Discharge status: Home / Self Care | Payer: Medicare Other | Source: Ambulatory Visit | Attending: Radiation Oncology | Admitting: Radiation Oncology

## 2019-09-10 DIAGNOSIS — Z510 Encounter for antineoplastic radiation therapy: Secondary | ICD-10-CM | POA: Diagnosis not present

## 2019-09-10 DIAGNOSIS — C342 Malignant neoplasm of middle lobe, bronchus or lung: Secondary | ICD-10-CM | POA: Diagnosis not present

## 2019-09-10 NOTE — Progress Notes (Signed)
[place] Spiritual Care Note  Reached Kimberly [name] by phone for pastoral follow-up and debriefing after co-visit with Dr Kara Dies Medicine. Kimberly [name] verbalized gratitude for the multiple layers of support she is receiving and describes herself as "at peace" with where she is right now (physically, spiritually, emotionally), which, she says, "surprises me, because I thought I'd be angry or upset." We talked about the "peace that passes understanding" as a gift right now. Kimberly [name] is looking forward to her daughter [name]'s visit on 1/15, when they are scheduled to have an orientation visit from hospice together. Kimberly [name] and I expect to see each other in the [facility] support group later this month, but she also knows to contact chaplain as desired in the meantime.   [place], [place], [facility] Pager [phone] Voicemail [phone]

## 2019-09-11 ENCOUNTER — Other Ambulatory Visit: Payer: Medicare Other

## 2019-09-11 ENCOUNTER — Ambulatory Visit
Admission: RE | Admit: 2019-09-11 | Discharge: 2019-09-11 | Disposition: A | Discharge status: Home / Self Care | Payer: Medicare Other | Source: Ambulatory Visit | Attending: Radiation Oncology | Admitting: Radiation Oncology

## 2019-09-11 ENCOUNTER — Other Ambulatory Visit: Payer: Self-pay

## 2019-09-11 ENCOUNTER — Ambulatory Visit: Payer: Medicare Other

## 2019-09-11 DIAGNOSIS — Z510 Encounter for antineoplastic radiation therapy: Secondary | ICD-10-CM | POA: Diagnosis not present

## 2019-09-11 DIAGNOSIS — C342 Malignant neoplasm of middle lobe, bronchus or lung: Secondary | ICD-10-CM | POA: Diagnosis not present

## 2019-09-12 ENCOUNTER — Other Ambulatory Visit: Payer: Self-pay

## 2019-09-12 ENCOUNTER — Ambulatory Visit
Admission: RE | Admit: 2019-09-12 | Discharge: 2019-09-12 | Disposition: A | Discharge status: Home / Self Care | Payer: Medicare Other | Source: Ambulatory Visit | Attending: Radiation Oncology | Admitting: Radiation Oncology

## 2019-09-12 DIAGNOSIS — Z510 Encounter for antineoplastic radiation therapy: Secondary | ICD-10-CM | POA: Diagnosis not present

## 2019-09-12 DIAGNOSIS — C342 Malignant neoplasm of middle lobe, bronchus or lung: Secondary | ICD-10-CM | POA: Diagnosis not present

## 2019-09-15 ENCOUNTER — Other Ambulatory Visit: Payer: Self-pay

## 2019-09-15 ENCOUNTER — Encounter: Payer: Self-pay | Admitting: Radiation Oncology

## 2019-09-15 ENCOUNTER — Ambulatory Visit
Admission: RE | Admit: 2019-09-15 | Discharge: 2019-09-15 | Disposition: A | Discharge status: Home / Self Care | Payer: Medicare Other | Source: Ambulatory Visit | Attending: Radiation Oncology | Admitting: Radiation Oncology

## 2019-09-15 DIAGNOSIS — C342 Malignant neoplasm of middle lobe, bronchus or lung: Secondary | ICD-10-CM | POA: Diagnosis not present

## 2019-09-15 DIAGNOSIS — Z510 Encounter for antineoplastic radiation therapy: Secondary | ICD-10-CM | POA: Diagnosis not present

## 2019-09-17 DIAGNOSIS — J9611 Chronic respiratory failure with hypoxia: Secondary | ICD-10-CM | POA: Diagnosis not present

## 2019-09-17 DIAGNOSIS — C3490 Malignant neoplasm of unspecified part of unspecified bronchus or lung: Secondary | ICD-10-CM | POA: Diagnosis not present

## 2019-09-17 NOTE — Progress Notes (Incomplete)
  Patient Name: Kimberly [name] MRN: [id number] DOB: 1940-12-28 Referring Physician: Modesto Charon (Profile Not Attached) Date of Service: 09/15/2019 [facility], [place]                                                        End Of Treatment Note  Diagnoses: 162.4-Malignant neoplasm of middle lobe bronchus or lung 162.5-Malignant neoplasm of lower lobe, bronchus or lung C34.2-Malignant neoplasm of middle lobe, bronchus or lung  Cancer Staging: Recurrent non-small cell right lung cancer, squamous cell carcinoma  Intent: Palliative  Radiation Treatment Dates: 08/25/2019 through 09/15/2019 Site Technique Total Dose (Gy) Dose per Fx (Gy) Completed Fx Beam Energies  Lung, Right: Lung_Rt IMRT 35/35 2.5 14/14 6X   Narrative: The patient tolerated radiation therapy relatively well. She did report some moderate to severe fatigue, poor appetite, dry cough, and shortness of breath with exertion. Patient was on 2L supplemental oxygen. Denied difficulty swallowing and hemoptysis. Per patient, previously reported chest pain had resolved with radiation therapy.  Plan: The patient will follow-up with radiation oncology in one month.  ________________________________________________   Blair Promise, PhD, MD  This document serves as a record of services personally performed by Gery Pray, MD. It was created on his behalf by Clerance Lav, a trained medical scribe. The creation of this record is based on the scribe's personal observations and the provider's statements to them. This document has been checked and approved by the attending provider.

## 2019-09-18 ENCOUNTER — Other Ambulatory Visit: Payer: Self-pay | Admitting: Pulmonary Disease

## 2019-09-22 DIAGNOSIS — J9611 Chronic respiratory failure with hypoxia: Secondary | ICD-10-CM | POA: Diagnosis not present

## 2019-09-23 ENCOUNTER — Telehealth: Payer: Self-pay | Admitting: Medical Oncology

## 2019-09-23 NOTE — Telephone Encounter (Signed)
Hospice -She is now under Hospice care and said to cancel all appts.   She appreciates everything Dr Julien Nordmann and his team has done for her .She has had "six beautiful years".   She will be moving to [facility] in the near future to be with her daughter .

## 2019-09-23 NOTE — Telephone Encounter (Signed)
OK. Thank you

## 2019-09-25 ENCOUNTER — Inpatient Hospital Stay: Payer: Medicare Other

## 2019-09-25 ENCOUNTER — Telehealth: Payer: Self-pay | Admitting: Internal Medicine

## 2019-09-25 NOTE — Telephone Encounter (Signed)
Returned patient's phone call regarding cancelling March appointments, per patient's request appointments have been cancelled.

## 2019-10-12 ENCOUNTER — Ambulatory Visit: Payer: Medicare Other | Attending: Internal Medicine

## 2019-10-12 DIAGNOSIS — Z23 Encounter for immunization: Secondary | ICD-10-CM | POA: Insufficient documentation

## 2019-10-12 NOTE — Progress Notes (Signed)
   Covid-19 Vaccination Clinic  Name:  ANNAH JASKO    MRN: [id number] DOB: 10-26-1940  10/12/2019  Ms. Bergren was observed post Covid-19 immunization for 15 minutes without incidence. She was provided with Vaccine Information Sheet and instruction to access the V-Safe system.   Ms. Verdell was instructed to call 911 with any severe reactions post vaccine: [name] Difficulty breathing  . Swelling of your face and throat  . A fast heartbeat  . A bad rash all over your body  . Dizziness and weakness    Immunizations Administered    Name Date Dose VIS Date Route   Pfizer COVID-19 Vaccine 10/12/2019  2:53 PM 0.3 mL 08/15/2019 Intramuscular   Manufacturer: [place]   Lot: [id number]   [place]: [id number]

## 2019-10-16 ENCOUNTER — Ambulatory Visit: Payer: Self-pay | Admitting: Radiation Oncology

## 2019-10-19 DIAGNOSIS — J9611 Chronic respiratory failure with hypoxia: Secondary | ICD-10-CM | POA: Diagnosis not present

## 2019-10-21 DIAGNOSIS — Z20822 Contact with and (suspected) exposure to covid-19: Secondary | ICD-10-CM | POA: Diagnosis not present

## 2019-10-23 DIAGNOSIS — J9611 Chronic respiratory failure with hypoxia: Secondary | ICD-10-CM | POA: Diagnosis not present

## 2019-10-27 ENCOUNTER — Ambulatory Visit: Payer: Medicare Other

## 2019-11-02 DIAGNOSIS — J9611 Chronic respiratory failure with hypoxia: Secondary | ICD-10-CM | POA: Diagnosis not present

## 2019-11-05 ENCOUNTER — Other Ambulatory Visit: Payer: Medicare Other

## 2019-11-05 DIAGNOSIS — R0689 Other abnormalities of breathing: Secondary | ICD-10-CM | POA: Diagnosis not present

## 2019-11-05 DIAGNOSIS — R000 Tachycardia, unspecified: Secondary | ICD-10-CM | POA: Diagnosis not present

## 2019-11-05 DIAGNOSIS — R001 Bradycardia, unspecified: Secondary | ICD-10-CM | POA: Diagnosis not present

## 2019-11-05 DIAGNOSIS — R55 Syncope and collapse: Secondary | ICD-10-CM | POA: Diagnosis not present

## 2019-11-05 DIAGNOSIS — Z743 Need for continuous supervision: Secondary | ICD-10-CM | POA: Diagnosis not present

## 2019-11-06 ENCOUNTER — Ambulatory Visit: Payer: Medicare Other

## 2019-11-10 ENCOUNTER — Ambulatory Visit: Payer: Medicare Other | Admitting: Internal Medicine

## 2019-11-16 DIAGNOSIS — J9611 Chronic respiratory failure with hypoxia: Secondary | ICD-10-CM | POA: Diagnosis not present

## 2019-11-17 ENCOUNTER — Ambulatory Visit: Attending: Internal Medicine

## 2019-11-17 DIAGNOSIS — Z23 Encounter for immunization: Secondary | ICD-10-CM

## 2019-11-17 NOTE — Progress Notes (Signed)
   Covid-19 Vaccination Clinic  Name:  Kimberly [name]    MRN: [id number] DOB: 18-Jun-1941  11/17/2019  [name] was observed post Covid-19 immunization for 15 minutes without incident. She was provided with Vaccine Information Sheet and instruction to access the V-Safe system.   [name] was instructed to call 911 with any severe reactions post vaccine: [name] Difficulty breathing  . Swelling of face and throat  . A fast heartbeat  . A bad rash all over body  . Dizziness and weakness   Immunizations Administered    Name Date Dose VIS Date Route   Pfizer COVID-19 Vaccine 11/17/2019  2:25 PM 0.3 mL 08/15/2019 Intramuscular   Manufacturer: [place]   Lot: [id number]   [place]: [address]-2

## 2019-11-20 DIAGNOSIS — J9611 Chronic respiratory failure with hypoxia: Secondary | ICD-10-CM | POA: Diagnosis not present

## 2019-11-23 DIAGNOSIS — J9611 Chronic respiratory failure with hypoxia: Secondary | ICD-10-CM | POA: Diagnosis not present

## 2019-12-01 DIAGNOSIS — J9611 Chronic respiratory failure with hypoxia: Secondary | ICD-10-CM | POA: Diagnosis not present

## 2019-12-17 DIAGNOSIS — J9611 Chronic respiratory failure with hypoxia: Secondary | ICD-10-CM | POA: Diagnosis not present

## 2019-12-21 DIAGNOSIS — J9611 Chronic respiratory failure with hypoxia: Secondary | ICD-10-CM | POA: Diagnosis not present

## 2019-12-24 DIAGNOSIS — J9611 Chronic respiratory failure with hypoxia: Secondary | ICD-10-CM | POA: Diagnosis not present

## 2020-01-01 DIAGNOSIS — J9611 Chronic respiratory failure with hypoxia: Secondary | ICD-10-CM | POA: Diagnosis not present

## 2020-05-05 DEATH — deceased

## 2020-06-20 IMAGING — DX DG CHEST 1V PORT
1 series · 1 of 1 positions shown · non-contrast
Comparison: Chest CT from 9 days ago

CLINICAL DATA: Fever.  On chemotherapy

EXAM:
PORTABLE CHEST 1 VIEW

[chest ap]
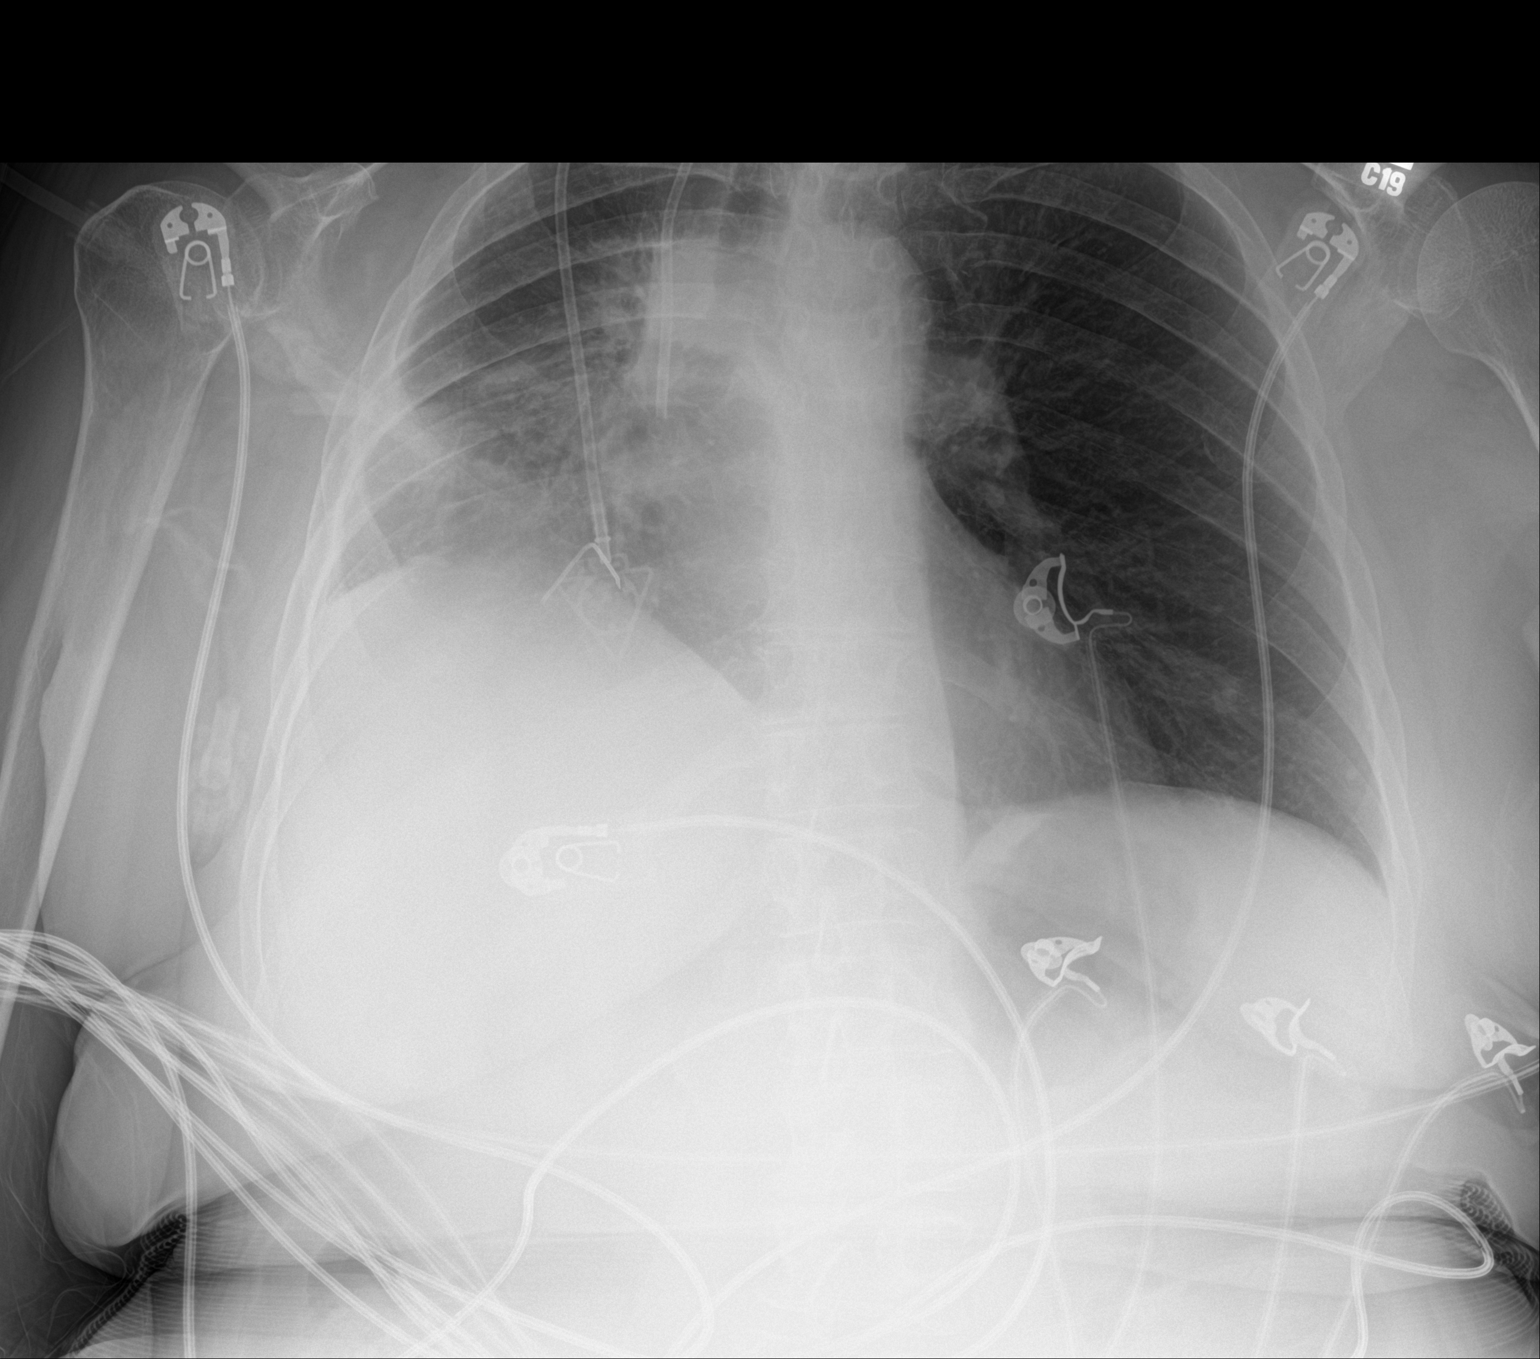

[1 of 1 positions shown; findings below may reference images not displayed]

FINDINGS: Right-sided volume loss and hazy opacity from pulmonary fibrosis and
pulmonary mass by comparison CT. The left lung is essentially clear.
Normal heart size. Porta catheter on the right with tip at the SVC.
IMPRESSION: Known right-sided mass and post treatment fibrosis. No acute finding
when compared to prior.
